# Patient Record
Sex: Female | Born: 1970 | Race: White | Hispanic: No | Marital: Married | Smoking: Never smoker
Health system: Southern US, Community
[De-identification: ages and names within clinical notes are randomized; demographics above are authoritative.]

## PROBLEM LIST (undated history)

## (undated) ENCOUNTER — Ambulatory Visit: Payer: 59 | Source: Home / Self Care

## (undated) DIAGNOSIS — J45909 Unspecified asthma, uncomplicated: Secondary | ICD-10-CM

## (undated) DIAGNOSIS — E119 Type 2 diabetes mellitus without complications: Secondary | ICD-10-CM

## (undated) DIAGNOSIS — I82409 Acute embolism and thrombosis of unspecified deep veins of unspecified lower extremity: Secondary | ICD-10-CM

## (undated) DIAGNOSIS — M0600 Rheumatoid arthritis without rheumatoid factor, unspecified site: Secondary | ICD-10-CM

## (undated) HISTORY — PX: BACK SURGERY: SHX140

## (undated) HISTORY — PX: CATARACT EXTRACTION: SUR2

## (undated) HISTORY — PX: EYE SURGERY: SHX253

## (undated) HISTORY — PX: OTHER SURGICAL HISTORY: SHX169

## (undated) HISTORY — PX: LAPAROSCOPIC GASTRIC SLEEVE RESECTION: SHX5895

---

## 2000-04-09 HISTORY — PX: CERVICAL CONE BIOPSY: SUR198

## 2006-03-09 HISTORY — PX: CORNEAL TRANSPLANT: SHX108

## 2008-10-30 DIAGNOSIS — L209 Atopic dermatitis, unspecified: Secondary | ICD-10-CM | POA: Insufficient documentation

## 2008-10-30 DIAGNOSIS — J4520 Mild intermittent asthma, uncomplicated: Secondary | ICD-10-CM | POA: Insufficient documentation

## 2008-10-30 DIAGNOSIS — J45909 Unspecified asthma, uncomplicated: Secondary | ICD-10-CM | POA: Insufficient documentation

## 2008-10-30 DIAGNOSIS — J4540 Moderate persistent asthma, uncomplicated: Secondary | ICD-10-CM | POA: Diagnosis present

## 2008-10-30 HISTORY — DX: Atopic dermatitis, unspecified: L20.9

## 2008-10-30 HISTORY — DX: Mild intermittent asthma, uncomplicated: J45.20

## 2012-08-31 DIAGNOSIS — H6993 Unspecified Eustachian tube disorder, bilateral: Secondary | ICD-10-CM | POA: Insufficient documentation

## 2012-08-31 HISTORY — DX: Unspecified eustachian tube disorder, bilateral: H69.93

## 2013-07-16 DIAGNOSIS — R350 Frequency of micturition: Secondary | ICD-10-CM

## 2013-07-16 HISTORY — DX: Frequency of micturition: R35.0

## 2013-12-12 DIAGNOSIS — G4733 Obstructive sleep apnea (adult) (pediatric): Secondary | ICD-10-CM

## 2013-12-12 HISTORY — DX: Obstructive sleep apnea (adult) (pediatric): G47.33

## 2014-08-28 DIAGNOSIS — M1380 Other specified arthritis, unspecified site: Secondary | ICD-10-CM

## 2014-08-28 DIAGNOSIS — H18609 Keratoconus, unspecified, unspecified eye: Secondary | ICD-10-CM | POA: Insufficient documentation

## 2014-08-28 DIAGNOSIS — M797 Fibromyalgia: Secondary | ICD-10-CM | POA: Insufficient documentation

## 2014-08-28 DIAGNOSIS — G43009 Migraine without aura, not intractable, without status migrainosus: Secondary | ICD-10-CM

## 2014-08-28 DIAGNOSIS — G43909 Migraine, unspecified, not intractable, without status migrainosus: Secondary | ICD-10-CM

## 2014-08-28 DIAGNOSIS — I10 Essential (primary) hypertension: Secondary | ICD-10-CM | POA: Insufficient documentation

## 2014-08-28 DIAGNOSIS — G43709 Chronic migraine without aura, not intractable, without status migrainosus: Secondary | ICD-10-CM | POA: Insufficient documentation

## 2014-08-28 HISTORY — DX: Essential (primary) hypertension: I10

## 2014-08-28 HISTORY — DX: Migraine, unspecified, not intractable, without status migrainosus: G43.909

## 2014-08-28 HISTORY — DX: Other specified arthritis, unspecified site: M13.80

## 2014-08-28 HISTORY — DX: Fibromyalgia: M79.7

## 2014-08-28 HISTORY — DX: Migraine without aura, not intractable, without status migrainosus: G43.009

## 2014-08-28 HISTORY — DX: Keratoconus, unspecified, unspecified eye: H18.609

## 2014-11-04 DIAGNOSIS — F419 Anxiety disorder, unspecified: Secondary | ICD-10-CM

## 2014-11-04 HISTORY — DX: Anxiety disorder, unspecified: F41.9

## 2014-11-29 DIAGNOSIS — N924 Excessive bleeding in the premenopausal period: Secondary | ICD-10-CM

## 2014-11-29 DIAGNOSIS — Z8742 Personal history of other diseases of the female genital tract: Secondary | ICD-10-CM

## 2014-11-29 HISTORY — DX: Personal history of other diseases of the female genital tract: Z87.42

## 2014-11-29 HISTORY — DX: Excessive bleeding in the premenopausal period: N92.4

## 2014-11-30 DIAGNOSIS — K639 Disease of intestine, unspecified: Secondary | ICD-10-CM

## 2014-11-30 HISTORY — DX: Enteropathic arthropathies, unspecified site: K63.9

## 2015-04-21 DIAGNOSIS — K5090 Crohn's disease, unspecified, without complications: Secondary | ICD-10-CM

## 2015-04-21 DIAGNOSIS — E119 Type 2 diabetes mellitus without complications: Secondary | ICD-10-CM

## 2015-04-21 DIAGNOSIS — M25561 Pain in right knee: Secondary | ICD-10-CM | POA: Insufficient documentation

## 2015-04-21 DIAGNOSIS — M170 Bilateral primary osteoarthritis of knee: Secondary | ICD-10-CM | POA: Insufficient documentation

## 2015-04-21 HISTORY — DX: Pain in right knee: M25.561

## 2015-04-21 HISTORY — DX: Crohn's disease, unspecified, without complications: K50.90

## 2015-04-21 HISTORY — DX: Type 2 diabetes mellitus without complications: E11.9

## 2015-04-21 HISTORY — DX: Bilateral primary osteoarthritis of knee: M17.0

## 2015-05-06 DIAGNOSIS — H04123 Dry eye syndrome of bilateral lacrimal glands: Secondary | ICD-10-CM

## 2015-05-06 DIAGNOSIS — H269 Unspecified cataract: Secondary | ICD-10-CM | POA: Insufficient documentation

## 2015-05-06 HISTORY — DX: Dry eye syndrome of bilateral lacrimal glands: H04.123

## 2015-05-06 HISTORY — DX: Unspecified cataract: H26.9

## 2015-06-18 DIAGNOSIS — R319 Hematuria, unspecified: Secondary | ICD-10-CM

## 2015-06-18 HISTORY — DX: Hematuria, unspecified: R31.9

## 2015-06-27 DIAGNOSIS — R3129 Other microscopic hematuria: Secondary | ICD-10-CM | POA: Insufficient documentation

## 2015-06-27 DIAGNOSIS — I10 Essential (primary) hypertension: Secondary | ICD-10-CM | POA: Insufficient documentation

## 2015-06-27 DIAGNOSIS — R809 Proteinuria, unspecified: Secondary | ICD-10-CM

## 2015-06-27 HISTORY — DX: Essential (primary) hypertension: I10

## 2015-06-27 HISTORY — DX: Proteinuria, unspecified: R80.9

## 2015-06-27 HISTORY — DX: Other microscopic hematuria: R31.29

## 2015-08-10 HISTORY — PX: GASTRIC BYPASS: SHX52

## 2015-10-07 DIAGNOSIS — R32 Unspecified urinary incontinence: Secondary | ICD-10-CM

## 2015-10-07 HISTORY — DX: Unspecified urinary incontinence: R32

## 2015-11-18 DIAGNOSIS — N3281 Overactive bladder: Secondary | ICD-10-CM | POA: Insufficient documentation

## 2015-11-18 HISTORY — DX: Overactive bladder: N32.81

## 2016-03-18 DIAGNOSIS — K921 Melena: Secondary | ICD-10-CM

## 2016-03-18 HISTORY — DX: Melena: K92.1

## 2016-05-07 DIAGNOSIS — H5210 Myopia, unspecified eye: Secondary | ICD-10-CM

## 2016-05-07 DIAGNOSIS — Z947 Corneal transplant status: Secondary | ICD-10-CM

## 2016-05-07 DIAGNOSIS — H40003 Preglaucoma, unspecified, bilateral: Secondary | ICD-10-CM | POA: Insufficient documentation

## 2016-05-07 HISTORY — DX: Myopia, unspecified eye: H52.10

## 2016-05-07 HISTORY — DX: Corneal transplant status: Z94.7

## 2016-05-07 HISTORY — DX: Preglaucoma, unspecified, bilateral: H40.003

## 2016-11-04 DIAGNOSIS — B070 Plantar wart: Secondary | ICD-10-CM | POA: Insufficient documentation

## 2016-11-04 HISTORY — DX: Plantar wart: B07.0

## 2016-11-07 HISTORY — PX: TOTAL KNEE ARTHROPLASTY: SHX125

## 2017-10-07 DIAGNOSIS — G4769 Other sleep related movement disorders: Secondary | ICD-10-CM

## 2017-10-07 HISTORY — DX: Other sleep related movement disorders: G47.69

## 2018-09-08 DIAGNOSIS — N200 Calculus of kidney: Secondary | ICD-10-CM | POA: Insufficient documentation

## 2018-09-08 HISTORY — DX: Calculus of kidney: N20.0

## 2018-10-11 DIAGNOSIS — H52213 Irregular astigmatism, bilateral: Secondary | ICD-10-CM | POA: Insufficient documentation

## 2018-10-11 HISTORY — DX: Irregular astigmatism, bilateral: H52.213

## 2019-06-19 DIAGNOSIS — Z860100 Personal history of colon polyps, unspecified: Secondary | ICD-10-CM

## 2019-06-19 HISTORY — DX: Personal history of colon polyps, unspecified: Z86.0100

## 2019-06-19 HISTORY — DX: Personal history of colonic polyps: Z86.010

## 2019-07-10 LAB — HM COLONOSCOPY

## 2019-08-01 DIAGNOSIS — Z9884 Bariatric surgery status: Secondary | ICD-10-CM | POA: Insufficient documentation

## 2019-08-01 DIAGNOSIS — K2100 Gastro-esophageal reflux disease with esophagitis, without bleeding: Secondary | ICD-10-CM

## 2019-08-01 DIAGNOSIS — K5901 Slow transit constipation: Secondary | ICD-10-CM

## 2019-08-01 HISTORY — DX: Slow transit constipation: K59.01

## 2019-08-01 HISTORY — DX: Gastro-esophageal reflux disease with esophagitis, without bleeding: K21.00

## 2019-08-01 HISTORY — DX: Bariatric surgery status: Z98.84

## 2020-05-12 DIAGNOSIS — M9905 Segmental and somatic dysfunction of pelvic region: Secondary | ICD-10-CM | POA: Diagnosis not present

## 2020-05-12 DIAGNOSIS — M9902 Segmental and somatic dysfunction of thoracic region: Secondary | ICD-10-CM | POA: Diagnosis not present

## 2020-05-12 DIAGNOSIS — M9904 Segmental and somatic dysfunction of sacral region: Secondary | ICD-10-CM | POA: Diagnosis not present

## 2020-05-12 DIAGNOSIS — M9903 Segmental and somatic dysfunction of lumbar region: Secondary | ICD-10-CM | POA: Diagnosis not present

## 2020-05-19 DIAGNOSIS — M9904 Segmental and somatic dysfunction of sacral region: Secondary | ICD-10-CM | POA: Diagnosis not present

## 2020-05-19 DIAGNOSIS — M9903 Segmental and somatic dysfunction of lumbar region: Secondary | ICD-10-CM | POA: Diagnosis not present

## 2020-05-19 DIAGNOSIS — M9905 Segmental and somatic dysfunction of pelvic region: Secondary | ICD-10-CM | POA: Diagnosis not present

## 2020-05-19 DIAGNOSIS — M9902 Segmental and somatic dysfunction of thoracic region: Secondary | ICD-10-CM | POA: Diagnosis not present

## 2020-05-26 DIAGNOSIS — M0609 Rheumatoid arthritis without rheumatoid factor, multiple sites: Secondary | ICD-10-CM | POA: Diagnosis not present

## 2020-05-26 DIAGNOSIS — M150 Primary generalized (osteo)arthritis: Secondary | ICD-10-CM | POA: Diagnosis not present

## 2020-05-26 DIAGNOSIS — M797 Fibromyalgia: Secondary | ICD-10-CM | POA: Diagnosis not present

## 2020-05-26 DIAGNOSIS — Z111 Encounter for screening for respiratory tuberculosis: Secondary | ICD-10-CM | POA: Diagnosis not present

## 2020-05-26 DIAGNOSIS — M2550 Pain in unspecified joint: Secondary | ICD-10-CM | POA: Diagnosis not present

## 2020-05-26 DIAGNOSIS — R5382 Chronic fatigue, unspecified: Secondary | ICD-10-CM | POA: Diagnosis not present

## 2020-05-27 DIAGNOSIS — J45909 Unspecified asthma, uncomplicated: Secondary | ICD-10-CM | POA: Diagnosis not present

## 2020-05-27 DIAGNOSIS — Z23 Encounter for immunization: Secondary | ICD-10-CM | POA: Diagnosis not present

## 2020-05-27 DIAGNOSIS — K219 Gastro-esophageal reflux disease without esophagitis: Secondary | ICD-10-CM | POA: Diagnosis not present

## 2020-05-27 DIAGNOSIS — N183 Chronic kidney disease, stage 3 unspecified: Secondary | ICD-10-CM | POA: Diagnosis not present

## 2020-05-27 DIAGNOSIS — M069 Rheumatoid arthritis, unspecified: Secondary | ICD-10-CM | POA: Diagnosis not present

## 2020-05-30 DIAGNOSIS — J45909 Unspecified asthma, uncomplicated: Secondary | ICD-10-CM | POA: Diagnosis not present

## 2020-05-30 DIAGNOSIS — T7840XA Allergy, unspecified, initial encounter: Secondary | ICD-10-CM | POA: Diagnosis not present

## 2020-06-03 DIAGNOSIS — M0609 Rheumatoid arthritis without rheumatoid factor, multiple sites: Secondary | ICD-10-CM | POA: Diagnosis not present

## 2020-06-16 DIAGNOSIS — M5136 Other intervertebral disc degeneration, lumbar region: Secondary | ICD-10-CM | POA: Diagnosis not present

## 2020-06-16 DIAGNOSIS — M9903 Segmental and somatic dysfunction of lumbar region: Secondary | ICD-10-CM | POA: Diagnosis not present

## 2020-06-16 DIAGNOSIS — M545 Low back pain, unspecified: Secondary | ICD-10-CM | POA: Diagnosis not present

## 2020-06-16 DIAGNOSIS — M9901 Segmental and somatic dysfunction of cervical region: Secondary | ICD-10-CM | POA: Diagnosis not present

## 2020-06-24 DIAGNOSIS — M797 Fibromyalgia: Secondary | ICD-10-CM | POA: Diagnosis not present

## 2020-06-24 DIAGNOSIS — M5451 Vertebrogenic low back pain: Secondary | ICD-10-CM | POA: Diagnosis not present

## 2020-06-24 DIAGNOSIS — M5386 Other specified dorsopathies, lumbar region: Secondary | ICD-10-CM | POA: Diagnosis not present

## 2020-06-24 DIAGNOSIS — M9903 Segmental and somatic dysfunction of lumbar region: Secondary | ICD-10-CM | POA: Diagnosis not present

## 2020-06-30 DIAGNOSIS — M9903 Segmental and somatic dysfunction of lumbar region: Secondary | ICD-10-CM | POA: Diagnosis not present

## 2020-06-30 DIAGNOSIS — M5451 Vertebrogenic low back pain: Secondary | ICD-10-CM | POA: Diagnosis not present

## 2020-06-30 DIAGNOSIS — M5386 Other specified dorsopathies, lumbar region: Secondary | ICD-10-CM | POA: Diagnosis not present

## 2020-06-30 DIAGNOSIS — M797 Fibromyalgia: Secondary | ICD-10-CM | POA: Diagnosis not present

## 2020-07-01 DIAGNOSIS — M0609 Rheumatoid arthritis without rheumatoid factor, multiple sites: Secondary | ICD-10-CM | POA: Diagnosis not present

## 2020-07-07 DIAGNOSIS — M0609 Rheumatoid arthritis without rheumatoid factor, multiple sites: Secondary | ICD-10-CM | POA: Diagnosis not present

## 2020-07-08 DIAGNOSIS — M797 Fibromyalgia: Secondary | ICD-10-CM | POA: Diagnosis not present

## 2020-07-08 DIAGNOSIS — M5386 Other specified dorsopathies, lumbar region: Secondary | ICD-10-CM | POA: Diagnosis not present

## 2020-07-08 DIAGNOSIS — M9903 Segmental and somatic dysfunction of lumbar region: Secondary | ICD-10-CM | POA: Diagnosis not present

## 2020-07-08 DIAGNOSIS — M5451 Vertebrogenic low back pain: Secondary | ICD-10-CM | POA: Diagnosis not present

## 2020-07-14 DIAGNOSIS — M797 Fibromyalgia: Secondary | ICD-10-CM | POA: Diagnosis not present

## 2020-07-14 DIAGNOSIS — M5451 Vertebrogenic low back pain: Secondary | ICD-10-CM | POA: Diagnosis not present

## 2020-07-14 DIAGNOSIS — M9903 Segmental and somatic dysfunction of lumbar region: Secondary | ICD-10-CM | POA: Diagnosis not present

## 2020-07-14 DIAGNOSIS — M5386 Other specified dorsopathies, lumbar region: Secondary | ICD-10-CM | POA: Diagnosis not present

## 2020-07-28 DIAGNOSIS — Z3046 Encounter for surveillance of implantable subdermal contraceptive: Secondary | ICD-10-CM | POA: Diagnosis not present

## 2020-08-04 DIAGNOSIS — M0609 Rheumatoid arthritis without rheumatoid factor, multiple sites: Secondary | ICD-10-CM | POA: Diagnosis not present

## 2020-08-12 DIAGNOSIS — Z87442 Personal history of urinary calculi: Secondary | ICD-10-CM

## 2020-08-12 DIAGNOSIS — G43009 Migraine without aura, not intractable, without status migrainosus: Secondary | ICD-10-CM | POA: Diagnosis not present

## 2020-08-12 DIAGNOSIS — R809 Proteinuria, unspecified: Secondary | ICD-10-CM

## 2020-08-12 DIAGNOSIS — Z9109 Other allergy status, other than to drugs and biological substances: Secondary | ICD-10-CM | POA: Insufficient documentation

## 2020-08-12 DIAGNOSIS — K047 Periapical abscess without sinus: Secondary | ICD-10-CM | POA: Diagnosis not present

## 2020-08-12 DIAGNOSIS — K219 Gastro-esophageal reflux disease without esophagitis: Secondary | ICD-10-CM | POA: Diagnosis not present

## 2020-08-12 DIAGNOSIS — J4520 Mild intermittent asthma, uncomplicated: Secondary | ICD-10-CM | POA: Diagnosis not present

## 2020-08-12 HISTORY — DX: Periapical abscess without sinus: K04.7

## 2020-08-12 HISTORY — DX: Personal history of urinary calculi: Z87.442

## 2020-08-12 HISTORY — DX: Proteinuria, unspecified: R80.9

## 2020-08-12 HISTORY — DX: Other allergy status, other than to drugs and biological substances: Z91.09

## 2020-08-14 DIAGNOSIS — Z1152 Encounter for screening for COVID-19: Secondary | ICD-10-CM | POA: Diagnosis not present

## 2020-08-14 DIAGNOSIS — K0889 Other specified disorders of teeth and supporting structures: Secondary | ICD-10-CM | POA: Diagnosis not present

## 2020-08-18 DIAGNOSIS — M9903 Segmental and somatic dysfunction of lumbar region: Secondary | ICD-10-CM | POA: Diagnosis not present

## 2020-08-18 DIAGNOSIS — M5451 Vertebrogenic low back pain: Secondary | ICD-10-CM | POA: Diagnosis not present

## 2020-08-18 DIAGNOSIS — M5386 Other specified dorsopathies, lumbar region: Secondary | ICD-10-CM | POA: Diagnosis not present

## 2020-08-18 DIAGNOSIS — M797 Fibromyalgia: Secondary | ICD-10-CM | POA: Diagnosis not present

## 2020-09-01 DIAGNOSIS — M0609 Rheumatoid arthritis without rheumatoid factor, multiple sites: Secondary | ICD-10-CM | POA: Diagnosis not present

## 2020-09-01 DIAGNOSIS — M5413 Radiculopathy, cervicothoracic region: Secondary | ICD-10-CM | POA: Diagnosis not present

## 2020-09-01 DIAGNOSIS — M5415 Radiculopathy, thoracolumbar region: Secondary | ICD-10-CM | POA: Diagnosis not present

## 2020-09-01 DIAGNOSIS — M9901 Segmental and somatic dysfunction of cervical region: Secondary | ICD-10-CM | POA: Diagnosis not present

## 2020-09-01 DIAGNOSIS — M9902 Segmental and somatic dysfunction of thoracic region: Secondary | ICD-10-CM | POA: Diagnosis not present

## 2020-09-09 DIAGNOSIS — M2550 Pain in unspecified joint: Secondary | ICD-10-CM | POA: Diagnosis not present

## 2020-09-09 DIAGNOSIS — M0609 Rheumatoid arthritis without rheumatoid factor, multiple sites: Secondary | ICD-10-CM | POA: Diagnosis not present

## 2020-09-09 DIAGNOSIS — M150 Primary generalized (osteo)arthritis: Secondary | ICD-10-CM | POA: Diagnosis not present

## 2020-09-09 DIAGNOSIS — M797 Fibromyalgia: Secondary | ICD-10-CM | POA: Diagnosis not present

## 2020-09-10 DIAGNOSIS — M9902 Segmental and somatic dysfunction of thoracic region: Secondary | ICD-10-CM | POA: Diagnosis not present

## 2020-09-10 DIAGNOSIS — M5415 Radiculopathy, thoracolumbar region: Secondary | ICD-10-CM | POA: Diagnosis not present

## 2020-09-10 DIAGNOSIS — M9901 Segmental and somatic dysfunction of cervical region: Secondary | ICD-10-CM | POA: Diagnosis not present

## 2020-09-10 DIAGNOSIS — M5413 Radiculopathy, cervicothoracic region: Secondary | ICD-10-CM | POA: Diagnosis not present

## 2020-09-11 DIAGNOSIS — K920 Hematemesis: Secondary | ICD-10-CM | POA: Diagnosis not present

## 2020-09-11 DIAGNOSIS — R3129 Other microscopic hematuria: Secondary | ICD-10-CM | POA: Diagnosis not present

## 2020-09-11 DIAGNOSIS — R110 Nausea: Secondary | ICD-10-CM | POA: Diagnosis not present

## 2020-09-11 DIAGNOSIS — R195 Other fecal abnormalities: Secondary | ICD-10-CM | POA: Diagnosis not present

## 2020-09-15 DIAGNOSIS — K219 Gastro-esophageal reflux disease without esophagitis: Secondary | ICD-10-CM | POA: Diagnosis not present

## 2020-09-15 DIAGNOSIS — K224 Dyskinesia of esophagus: Secondary | ICD-10-CM | POA: Diagnosis not present

## 2020-09-15 DIAGNOSIS — R112 Nausea with vomiting, unspecified: Secondary | ICD-10-CM | POA: Diagnosis not present

## 2020-09-15 DIAGNOSIS — Z86010 Personal history of colonic polyps: Secondary | ICD-10-CM | POA: Diagnosis not present

## 2020-09-15 DIAGNOSIS — K920 Hematemesis: Secondary | ICD-10-CM | POA: Diagnosis not present

## 2020-09-22 DIAGNOSIS — Z01419 Encounter for gynecological examination (general) (routine) without abnormal findings: Secondary | ICD-10-CM | POA: Diagnosis not present

## 2020-09-22 DIAGNOSIS — Z1231 Encounter for screening mammogram for malignant neoplasm of breast: Secondary | ICD-10-CM | POA: Diagnosis not present

## 2020-09-30 DIAGNOSIS — G8929 Other chronic pain: Secondary | ICD-10-CM | POA: Diagnosis not present

## 2020-09-30 DIAGNOSIS — M1712 Unilateral primary osteoarthritis, left knee: Secondary | ICD-10-CM | POA: Diagnosis not present

## 2020-09-30 DIAGNOSIS — M25561 Pain in right knee: Secondary | ICD-10-CM | POA: Diagnosis not present

## 2020-09-30 DIAGNOSIS — M1711 Unilateral primary osteoarthritis, right knee: Secondary | ICD-10-CM | POA: Diagnosis not present

## 2020-09-30 DIAGNOSIS — M25562 Pain in left knee: Secondary | ICD-10-CM | POA: Diagnosis not present

## 2020-09-30 DIAGNOSIS — Z96651 Presence of right artificial knee joint: Secondary | ICD-10-CM | POA: Diagnosis not present

## 2020-10-06 DIAGNOSIS — M0609 Rheumatoid arthritis without rheumatoid factor, multiple sites: Secondary | ICD-10-CM | POA: Diagnosis not present

## 2020-10-12 DIAGNOSIS — M069 Rheumatoid arthritis, unspecified: Secondary | ICD-10-CM | POA: Diagnosis not present

## 2020-10-12 DIAGNOSIS — R112 Nausea with vomiting, unspecified: Secondary | ICD-10-CM | POA: Diagnosis not present

## 2020-10-12 DIAGNOSIS — Z79899 Other long term (current) drug therapy: Secondary | ICD-10-CM | POA: Diagnosis not present

## 2020-10-12 DIAGNOSIS — J449 Chronic obstructive pulmonary disease, unspecified: Secondary | ICD-10-CM | POA: Diagnosis not present

## 2020-10-16 DIAGNOSIS — M9901 Segmental and somatic dysfunction of cervical region: Secondary | ICD-10-CM | POA: Diagnosis not present

## 2020-10-16 DIAGNOSIS — M5136 Other intervertebral disc degeneration, lumbar region: Secondary | ICD-10-CM | POA: Diagnosis not present

## 2020-10-16 DIAGNOSIS — M9903 Segmental and somatic dysfunction of lumbar region: Secondary | ICD-10-CM | POA: Diagnosis not present

## 2020-10-16 DIAGNOSIS — M5451 Vertebrogenic low back pain: Secondary | ICD-10-CM | POA: Diagnosis not present

## 2020-10-20 DIAGNOSIS — K219 Gastro-esophageal reflux disease without esophagitis: Secondary | ICD-10-CM | POA: Diagnosis not present

## 2020-10-20 DIAGNOSIS — K2100 Gastro-esophageal reflux disease with esophagitis, without bleeding: Secondary | ICD-10-CM | POA: Diagnosis not present

## 2020-10-20 DIAGNOSIS — K920 Hematemesis: Secondary | ICD-10-CM | POA: Diagnosis not present

## 2020-10-20 DIAGNOSIS — B3781 Candidal esophagitis: Secondary | ICD-10-CM | POA: Diagnosis not present

## 2020-10-20 DIAGNOSIS — Z9884 Bariatric surgery status: Secondary | ICD-10-CM | POA: Diagnosis not present

## 2020-10-20 DIAGNOSIS — Z903 Acquired absence of stomach [part of]: Secondary | ICD-10-CM | POA: Diagnosis not present

## 2020-10-20 DIAGNOSIS — K2210 Ulcer of esophagus without bleeding: Secondary | ICD-10-CM | POA: Diagnosis not present

## 2020-10-20 DIAGNOSIS — K208 Other esophagitis without bleeding: Secondary | ICD-10-CM | POA: Diagnosis not present

## 2020-10-20 DIAGNOSIS — K319 Disease of stomach and duodenum, unspecified: Secondary | ICD-10-CM | POA: Diagnosis not present

## 2020-10-21 DIAGNOSIS — M5451 Vertebrogenic low back pain: Secondary | ICD-10-CM | POA: Diagnosis not present

## 2020-10-21 DIAGNOSIS — M9903 Segmental and somatic dysfunction of lumbar region: Secondary | ICD-10-CM | POA: Diagnosis not present

## 2020-10-21 DIAGNOSIS — Z1231 Encounter for screening mammogram for malignant neoplasm of breast: Secondary | ICD-10-CM | POA: Diagnosis not present

## 2020-10-21 DIAGNOSIS — M5136 Other intervertebral disc degeneration, lumbar region: Secondary | ICD-10-CM | POA: Diagnosis not present

## 2020-10-21 DIAGNOSIS — M9901 Segmental and somatic dysfunction of cervical region: Secondary | ICD-10-CM | POA: Diagnosis not present

## 2020-10-26 DIAGNOSIS — B370 Candidal stomatitis: Secondary | ICD-10-CM | POA: Diagnosis not present

## 2020-10-27 DIAGNOSIS — M5136 Other intervertebral disc degeneration, lumbar region: Secondary | ICD-10-CM | POA: Diagnosis not present

## 2020-10-27 DIAGNOSIS — M9901 Segmental and somatic dysfunction of cervical region: Secondary | ICD-10-CM | POA: Diagnosis not present

## 2020-10-27 DIAGNOSIS — M9903 Segmental and somatic dysfunction of lumbar region: Secondary | ICD-10-CM | POA: Diagnosis not present

## 2020-10-27 DIAGNOSIS — M5451 Vertebrogenic low back pain: Secondary | ICD-10-CM | POA: Diagnosis not present

## 2020-11-04 DIAGNOSIS — R801 Persistent proteinuria, unspecified: Secondary | ICD-10-CM | POA: Diagnosis not present

## 2020-11-04 DIAGNOSIS — N3946 Mixed incontinence: Secondary | ICD-10-CM | POA: Diagnosis not present

## 2020-11-04 DIAGNOSIS — N200 Calculus of kidney: Secondary | ICD-10-CM | POA: Diagnosis not present

## 2020-11-04 DIAGNOSIS — R3129 Other microscopic hematuria: Secondary | ICD-10-CM | POA: Diagnosis not present

## 2020-11-11 DIAGNOSIS — M170 Bilateral primary osteoarthritis of knee: Secondary | ICD-10-CM | POA: Diagnosis not present

## 2020-11-17 DIAGNOSIS — M0609 Rheumatoid arthritis without rheumatoid factor, multiple sites: Secondary | ICD-10-CM | POA: Diagnosis not present

## 2020-11-18 DIAGNOSIS — N200 Calculus of kidney: Secondary | ICD-10-CM | POA: Diagnosis not present

## 2020-11-18 DIAGNOSIS — N132 Hydronephrosis with renal and ureteral calculous obstruction: Secondary | ICD-10-CM | POA: Diagnosis not present

## 2020-11-18 DIAGNOSIS — K219 Gastro-esophageal reflux disease without esophagitis: Secondary | ICD-10-CM | POA: Diagnosis not present

## 2020-11-18 DIAGNOSIS — R1319 Other dysphagia: Secondary | ICD-10-CM | POA: Diagnosis not present

## 2020-11-19 DIAGNOSIS — R109 Unspecified abdominal pain: Secondary | ICD-10-CM | POA: Diagnosis not present

## 2020-11-19 DIAGNOSIS — R319 Hematuria, unspecified: Secondary | ICD-10-CM | POA: Diagnosis not present

## 2020-11-19 DIAGNOSIS — N201 Calculus of ureter: Secondary | ICD-10-CM | POA: Diagnosis not present

## 2020-11-19 DIAGNOSIS — N132 Hydronephrosis with renal and ureteral calculous obstruction: Secondary | ICD-10-CM | POA: Diagnosis not present

## 2020-11-21 DIAGNOSIS — Z20822 Contact with and (suspected) exposure to covid-19: Secondary | ICD-10-CM | POA: Diagnosis not present

## 2020-11-21 DIAGNOSIS — M47816 Spondylosis without myelopathy or radiculopathy, lumbar region: Secondary | ICD-10-CM | POA: Diagnosis not present

## 2020-11-21 DIAGNOSIS — R1031 Right lower quadrant pain: Secondary | ICD-10-CM | POA: Diagnosis not present

## 2020-11-21 DIAGNOSIS — R279 Unspecified lack of coordination: Secondary | ICD-10-CM | POA: Diagnosis not present

## 2020-11-21 DIAGNOSIS — N200 Calculus of kidney: Secondary | ICD-10-CM | POA: Diagnosis not present

## 2020-11-21 DIAGNOSIS — K449 Diaphragmatic hernia without obstruction or gangrene: Secondary | ICD-10-CM | POA: Diagnosis not present

## 2020-11-21 DIAGNOSIS — J449 Chronic obstructive pulmonary disease, unspecified: Secondary | ICD-10-CM | POA: Diagnosis not present

## 2020-11-21 DIAGNOSIS — K429 Umbilical hernia without obstruction or gangrene: Secondary | ICD-10-CM | POA: Diagnosis not present

## 2020-11-21 DIAGNOSIS — N132 Hydronephrosis with renal and ureteral calculous obstruction: Secondary | ICD-10-CM | POA: Diagnosis not present

## 2020-11-21 DIAGNOSIS — Z743 Need for continuous supervision: Secondary | ICD-10-CM | POA: Diagnosis not present

## 2020-11-21 DIAGNOSIS — M069 Rheumatoid arthritis, unspecified: Secondary | ICD-10-CM | POA: Diagnosis not present

## 2020-11-21 DIAGNOSIS — N1330 Unspecified hydronephrosis: Secondary | ICD-10-CM | POA: Diagnosis not present

## 2020-11-21 DIAGNOSIS — Z79899 Other long term (current) drug therapy: Secondary | ICD-10-CM | POA: Diagnosis not present

## 2020-11-22 DIAGNOSIS — N132 Hydronephrosis with renal and ureteral calculous obstruction: Secondary | ICD-10-CM | POA: Diagnosis not present

## 2020-11-24 DIAGNOSIS — I2510 Atherosclerotic heart disease of native coronary artery without angina pectoris: Secondary | ICD-10-CM | POA: Diagnosis not present

## 2020-11-24 DIAGNOSIS — R590 Localized enlarged lymph nodes: Secondary | ICD-10-CM | POA: Diagnosis not present

## 2020-11-24 DIAGNOSIS — R933 Abnormal findings on diagnostic imaging of other parts of digestive tract: Secondary | ICD-10-CM | POA: Diagnosis not present

## 2020-11-24 DIAGNOSIS — R1310 Dysphagia, unspecified: Secondary | ICD-10-CM | POA: Diagnosis not present

## 2020-11-24 DIAGNOSIS — R109 Unspecified abdominal pain: Secondary | ICD-10-CM | POA: Insufficient documentation

## 2020-11-24 DIAGNOSIS — K449 Diaphragmatic hernia without obstruction or gangrene: Secondary | ICD-10-CM | POA: Diagnosis not present

## 2020-11-24 HISTORY — DX: Unspecified abdominal pain: R10.9

## 2020-11-28 DIAGNOSIS — K224 Dyskinesia of esophagus: Secondary | ICD-10-CM | POA: Diagnosis not present

## 2020-12-01 DIAGNOSIS — M25562 Pain in left knee: Secondary | ICD-10-CM | POA: Diagnosis not present

## 2020-12-01 DIAGNOSIS — G8929 Other chronic pain: Secondary | ICD-10-CM | POA: Diagnosis not present

## 2020-12-01 DIAGNOSIS — Z947 Corneal transplant status: Secondary | ICD-10-CM | POA: Diagnosis not present

## 2020-12-01 DIAGNOSIS — H25813 Combined forms of age-related cataract, bilateral: Secondary | ICD-10-CM | POA: Diagnosis not present

## 2020-12-01 DIAGNOSIS — M170 Bilateral primary osteoarthritis of knee: Secondary | ICD-10-CM | POA: Diagnosis not present

## 2020-12-01 DIAGNOSIS — H04123 Dry eye syndrome of bilateral lacrimal glands: Secondary | ICD-10-CM | POA: Diagnosis not present

## 2020-12-05 DIAGNOSIS — N201 Calculus of ureter: Secondary | ICD-10-CM | POA: Diagnosis not present

## 2020-12-09 DIAGNOSIS — R112 Nausea with vomiting, unspecified: Secondary | ICD-10-CM | POA: Diagnosis not present

## 2020-12-09 DIAGNOSIS — K224 Dyskinesia of esophagus: Secondary | ICD-10-CM | POA: Diagnosis not present

## 2020-12-09 DIAGNOSIS — Z903 Acquired absence of stomach [part of]: Secondary | ICD-10-CM | POA: Diagnosis not present

## 2020-12-09 DIAGNOSIS — K219 Gastro-esophageal reflux disease without esophagitis: Secondary | ICD-10-CM | POA: Diagnosis not present

## 2020-12-15 DIAGNOSIS — M25562 Pain in left knee: Secondary | ICD-10-CM | POA: Diagnosis not present

## 2020-12-15 DIAGNOSIS — M1712 Unilateral primary osteoarthritis, left knee: Secondary | ICD-10-CM | POA: Diagnosis not present

## 2020-12-15 DIAGNOSIS — M25561 Pain in right knee: Secondary | ICD-10-CM | POA: Diagnosis not present

## 2020-12-19 DIAGNOSIS — R109 Unspecified abdominal pain: Secondary | ICD-10-CM | POA: Diagnosis not present

## 2020-12-19 DIAGNOSIS — M0609 Rheumatoid arthritis without rheumatoid factor, multiple sites: Secondary | ICD-10-CM | POA: Diagnosis not present

## 2020-12-19 DIAGNOSIS — N3946 Mixed incontinence: Secondary | ICD-10-CM | POA: Diagnosis not present

## 2020-12-19 DIAGNOSIS — Z960 Presence of urogenital implants: Secondary | ICD-10-CM | POA: Diagnosis not present

## 2020-12-19 DIAGNOSIS — R319 Hematuria, unspecified: Secondary | ICD-10-CM | POA: Diagnosis not present

## 2020-12-19 DIAGNOSIS — Z87442 Personal history of urinary calculi: Secondary | ICD-10-CM | POA: Diagnosis not present

## 2020-12-26 DIAGNOSIS — M9901 Segmental and somatic dysfunction of cervical region: Secondary | ICD-10-CM | POA: Diagnosis not present

## 2020-12-26 DIAGNOSIS — R112 Nausea with vomiting, unspecified: Secondary | ICD-10-CM | POA: Diagnosis not present

## 2020-12-26 DIAGNOSIS — M5136 Other intervertebral disc degeneration, lumbar region: Secondary | ICD-10-CM | POA: Diagnosis not present

## 2020-12-26 DIAGNOSIS — M9903 Segmental and somatic dysfunction of lumbar region: Secondary | ICD-10-CM | POA: Diagnosis not present

## 2020-12-26 DIAGNOSIS — Z87442 Personal history of urinary calculi: Secondary | ICD-10-CM | POA: Diagnosis not present

## 2020-12-26 DIAGNOSIS — R1110 Vomiting, unspecified: Secondary | ICD-10-CM | POA: Diagnosis not present

## 2020-12-26 DIAGNOSIS — Z960 Presence of urogenital implants: Secondary | ICD-10-CM | POA: Diagnosis not present

## 2020-12-26 DIAGNOSIS — M5451 Vertebrogenic low back pain: Secondary | ICD-10-CM | POA: Diagnosis not present

## 2021-01-02 DIAGNOSIS — M5451 Vertebrogenic low back pain: Secondary | ICD-10-CM | POA: Diagnosis not present

## 2021-01-02 DIAGNOSIS — M9903 Segmental and somatic dysfunction of lumbar region: Secondary | ICD-10-CM | POA: Diagnosis not present

## 2021-01-02 DIAGNOSIS — M5136 Other intervertebral disc degeneration, lumbar region: Secondary | ICD-10-CM | POA: Diagnosis not present

## 2021-01-02 DIAGNOSIS — M9901 Segmental and somatic dysfunction of cervical region: Secondary | ICD-10-CM | POA: Diagnosis not present

## 2021-01-09 DIAGNOSIS — R1110 Vomiting, unspecified: Secondary | ICD-10-CM | POA: Diagnosis not present

## 2021-01-09 DIAGNOSIS — R1310 Dysphagia, unspecified: Secondary | ICD-10-CM | POA: Diagnosis not present

## 2021-01-09 DIAGNOSIS — M5451 Vertebrogenic low back pain: Secondary | ICD-10-CM | POA: Diagnosis not present

## 2021-01-09 DIAGNOSIS — M5136 Other intervertebral disc degeneration, lumbar region: Secondary | ICD-10-CM | POA: Diagnosis not present

## 2021-01-09 DIAGNOSIS — R079 Chest pain, unspecified: Secondary | ICD-10-CM | POA: Diagnosis not present

## 2021-01-09 DIAGNOSIS — M9903 Segmental and somatic dysfunction of lumbar region: Secondary | ICD-10-CM | POA: Diagnosis not present

## 2021-01-09 DIAGNOSIS — M9901 Segmental and somatic dysfunction of cervical region: Secondary | ICD-10-CM | POA: Diagnosis not present

## 2021-01-13 DIAGNOSIS — Z947 Corneal transplant status: Secondary | ICD-10-CM | POA: Diagnosis not present

## 2021-01-13 DIAGNOSIS — N3281 Overactive bladder: Secondary | ICD-10-CM | POA: Diagnosis not present

## 2021-01-13 DIAGNOSIS — N201 Calculus of ureter: Secondary | ICD-10-CM | POA: Diagnosis not present

## 2021-01-13 DIAGNOSIS — J4520 Mild intermittent asthma, uncomplicated: Secondary | ICD-10-CM | POA: Diagnosis not present

## 2021-01-13 DIAGNOSIS — H18609 Keratoconus, unspecified, unspecified eye: Secondary | ICD-10-CM | POA: Diagnosis not present

## 2021-01-13 DIAGNOSIS — Z87442 Personal history of urinary calculi: Secondary | ICD-10-CM | POA: Diagnosis not present

## 2021-01-13 DIAGNOSIS — N132 Hydronephrosis with renal and ureteral calculous obstruction: Secondary | ICD-10-CM | POA: Diagnosis not present

## 2021-01-13 DIAGNOSIS — G43009 Migraine without aura, not intractable, without status migrainosus: Secondary | ICD-10-CM | POA: Diagnosis not present

## 2021-01-13 DIAGNOSIS — Z9884 Bariatric surgery status: Secondary | ICD-10-CM | POA: Diagnosis not present

## 2021-01-13 DIAGNOSIS — K5090 Crohn's disease, unspecified, without complications: Secondary | ICD-10-CM | POA: Diagnosis not present

## 2021-01-13 DIAGNOSIS — K2100 Gastro-esophageal reflux disease with esophagitis, without bleeding: Secondary | ICD-10-CM | POA: Diagnosis not present

## 2021-01-13 DIAGNOSIS — N1330 Unspecified hydronephrosis: Secondary | ICD-10-CM | POA: Diagnosis not present

## 2021-01-13 DIAGNOSIS — N200 Calculus of kidney: Secondary | ICD-10-CM | POA: Diagnosis not present

## 2021-01-13 DIAGNOSIS — M170 Bilateral primary osteoarthritis of knee: Secondary | ICD-10-CM | POA: Diagnosis not present

## 2021-01-13 DIAGNOSIS — R1031 Right lower quadrant pain: Secondary | ICD-10-CM | POA: Diagnosis not present

## 2021-01-13 DIAGNOSIS — G8929 Other chronic pain: Secondary | ICD-10-CM | POA: Diagnosis not present

## 2021-01-13 DIAGNOSIS — M25562 Pain in left knee: Secondary | ICD-10-CM | POA: Diagnosis not present

## 2021-01-13 DIAGNOSIS — M069 Rheumatoid arthritis, unspecified: Secondary | ICD-10-CM | POA: Diagnosis not present

## 2021-01-13 DIAGNOSIS — N3946 Mixed incontinence: Secondary | ICD-10-CM | POA: Diagnosis not present

## 2021-01-14 DIAGNOSIS — Z947 Corneal transplant status: Secondary | ICD-10-CM | POA: Diagnosis not present

## 2021-01-14 DIAGNOSIS — M170 Bilateral primary osteoarthritis of knee: Secondary | ICD-10-CM | POA: Diagnosis not present

## 2021-01-14 DIAGNOSIS — N1339 Other hydronephrosis: Secondary | ICD-10-CM | POA: Diagnosis not present

## 2021-01-14 DIAGNOSIS — N3281 Overactive bladder: Secondary | ICD-10-CM | POA: Diagnosis not present

## 2021-01-14 DIAGNOSIS — N132 Hydronephrosis with renal and ureteral calculous obstruction: Secondary | ICD-10-CM | POA: Diagnosis not present

## 2021-01-14 DIAGNOSIS — H18609 Keratoconus, unspecified, unspecified eye: Secondary | ICD-10-CM | POA: Diagnosis not present

## 2021-01-14 DIAGNOSIS — K2100 Gastro-esophageal reflux disease with esophagitis, without bleeding: Secondary | ICD-10-CM | POA: Diagnosis not present

## 2021-01-14 DIAGNOSIS — M25562 Pain in left knee: Secondary | ICD-10-CM | POA: Diagnosis not present

## 2021-01-14 DIAGNOSIS — G43009 Migraine without aura, not intractable, without status migrainosus: Secondary | ICD-10-CM | POA: Diagnosis not present

## 2021-01-14 DIAGNOSIS — Z9884 Bariatric surgery status: Secondary | ICD-10-CM | POA: Diagnosis not present

## 2021-01-14 DIAGNOSIS — K5090 Crohn's disease, unspecified, without complications: Secondary | ICD-10-CM | POA: Diagnosis not present

## 2021-01-14 DIAGNOSIS — J4520 Mild intermittent asthma, uncomplicated: Secondary | ICD-10-CM | POA: Diagnosis not present

## 2021-01-14 DIAGNOSIS — N3946 Mixed incontinence: Secondary | ICD-10-CM | POA: Diagnosis not present

## 2021-01-14 DIAGNOSIS — M069 Rheumatoid arthritis, unspecified: Secondary | ICD-10-CM | POA: Diagnosis not present

## 2021-01-14 DIAGNOSIS — Z87442 Personal history of urinary calculi: Secondary | ICD-10-CM | POA: Diagnosis not present

## 2021-01-14 DIAGNOSIS — G8929 Other chronic pain: Secondary | ICD-10-CM | POA: Diagnosis not present

## 2021-01-23 DIAGNOSIS — M0609 Rheumatoid arthritis without rheumatoid factor, multiple sites: Secondary | ICD-10-CM | POA: Diagnosis not present

## 2021-01-24 DIAGNOSIS — Z9689 Presence of other specified functional implants: Secondary | ICD-10-CM | POA: Diagnosis not present

## 2021-01-24 DIAGNOSIS — K219 Gastro-esophageal reflux disease without esophagitis: Secondary | ICD-10-CM | POA: Diagnosis not present

## 2021-01-24 DIAGNOSIS — Z79899 Other long term (current) drug therapy: Secondary | ICD-10-CM | POA: Diagnosis not present

## 2021-01-24 DIAGNOSIS — R109 Unspecified abdominal pain: Secondary | ICD-10-CM | POA: Diagnosis not present

## 2021-01-24 DIAGNOSIS — Z9884 Bariatric surgery status: Secondary | ICD-10-CM | POA: Diagnosis not present

## 2021-01-24 DIAGNOSIS — E669 Obesity, unspecified: Secondary | ICD-10-CM | POA: Diagnosis not present

## 2021-01-24 DIAGNOSIS — N200 Calculus of kidney: Secondary | ICD-10-CM | POA: Diagnosis not present

## 2021-01-24 DIAGNOSIS — M069 Rheumatoid arthritis, unspecified: Secondary | ICD-10-CM | POA: Diagnosis not present

## 2021-01-24 DIAGNOSIS — D72829 Elevated white blood cell count, unspecified: Secondary | ICD-10-CM | POA: Diagnosis not present

## 2021-01-30 DIAGNOSIS — N200 Calculus of kidney: Secondary | ICD-10-CM | POA: Diagnosis not present

## 2021-02-02 DIAGNOSIS — N23 Unspecified renal colic: Secondary | ICD-10-CM | POA: Diagnosis not present

## 2021-02-02 DIAGNOSIS — K429 Umbilical hernia without obstruction or gangrene: Secondary | ICD-10-CM | POA: Diagnosis not present

## 2021-02-02 DIAGNOSIS — N1330 Unspecified hydronephrosis: Secondary | ICD-10-CM | POA: Diagnosis not present

## 2021-02-02 DIAGNOSIS — N200 Calculus of kidney: Secondary | ICD-10-CM | POA: Diagnosis not present

## 2021-02-02 DIAGNOSIS — K3189 Other diseases of stomach and duodenum: Secondary | ICD-10-CM | POA: Diagnosis not present

## 2021-02-02 DIAGNOSIS — R310 Gross hematuria: Secondary | ICD-10-CM | POA: Diagnosis not present

## 2021-02-02 DIAGNOSIS — R109 Unspecified abdominal pain: Secondary | ICD-10-CM | POA: Diagnosis not present

## 2021-02-06 DIAGNOSIS — Z960 Presence of urogenital implants: Secondary | ICD-10-CM | POA: Diagnosis not present

## 2021-02-06 DIAGNOSIS — N23 Unspecified renal colic: Secondary | ICD-10-CM | POA: Diagnosis not present

## 2021-02-06 DIAGNOSIS — R310 Gross hematuria: Secondary | ICD-10-CM | POA: Diagnosis not present

## 2021-02-06 DIAGNOSIS — N200 Calculus of kidney: Secondary | ICD-10-CM | POA: Diagnosis not present

## 2021-02-06 HISTORY — DX: Presence of urogenital implants: Z96.0

## 2021-02-08 ENCOUNTER — Emergency Department (HOSPITAL_COMMUNITY): Payer: BC Managed Care – PPO

## 2021-02-08 ENCOUNTER — Emergency Department (HOSPITAL_COMMUNITY)
Admission: EM | Admit: 2021-02-08 | Discharge: 2021-02-08 | Disposition: A | Discharge status: Home / Self Care | Payer: BC Managed Care – PPO | Attending: Emergency Medicine | Admitting: Emergency Medicine

## 2021-02-08 DIAGNOSIS — N201 Calculus of ureter: Secondary | ICD-10-CM | POA: Diagnosis not present

## 2021-02-08 DIAGNOSIS — M549 Dorsalgia, unspecified: Secondary | ICD-10-CM | POA: Insufficient documentation

## 2021-02-08 DIAGNOSIS — N132 Hydronephrosis with renal and ureteral calculous obstruction: Secondary | ICD-10-CM | POA: Diagnosis not present

## 2021-02-08 DIAGNOSIS — R109 Unspecified abdominal pain: Secondary | ICD-10-CM | POA: Diagnosis not present

## 2021-02-08 DIAGNOSIS — K429 Umbilical hernia without obstruction or gangrene: Secondary | ICD-10-CM | POA: Diagnosis not present

## 2021-02-08 DIAGNOSIS — K449 Diaphragmatic hernia without obstruction or gangrene: Secondary | ICD-10-CM | POA: Diagnosis not present

## 2021-02-08 LAB — CBC WITH DIFFERENTIAL/PLATELET
Abs Immature Granulocytes: 0.03 10*3/uL (ref 0.00–0.07)
Basophils Absolute: 0 10*3/uL (ref 0.0–0.1)
Basophils Relative: 0 %
Eosinophils Absolute: 0.3 10*3/uL (ref 0.0–0.5)
Eosinophils Relative: 3 %
HCT: 42.9 % (ref 36.0–46.0)
Hemoglobin: 14.3 g/dL (ref 12.0–15.0)
Immature Granulocytes: 0 %
Lymphocytes Relative: 35 %
Lymphs Abs: 3.6 10*3/uL (ref 0.7–4.0)
MCH: 29.7 pg (ref 26.0–34.0)
MCHC: 33.3 g/dL (ref 30.0–36.0)
MCV: 89 fL (ref 80.0–100.0)
Monocytes Absolute: 0.8 10*3/uL (ref 0.1–1.0)
Monocytes Relative: 8 %
Neutro Abs: 5.3 10*3/uL (ref 1.7–7.7)
Neutrophils Relative %: 54 %
Platelets: 297 10*3/uL (ref 150–400)
RBC: 4.82 MIL/uL (ref 3.87–5.11)
RDW: 13.6 % (ref 11.5–15.5)
WBC: 10.1 10*3/uL (ref 4.0–10.5)
nRBC: 0 % (ref 0.0–0.2)

## 2021-02-08 LAB — COMPREHENSIVE METABOLIC PANEL
ALT: 17 U/L (ref 0–44)
AST: 22 U/L (ref 15–41)
Albumin: 3.5 g/dL (ref 3.5–5.0)
Alkaline Phosphatase: 58 U/L (ref 38–126)
Anion gap: 6 (ref 5–15)
BUN: 12 mg/dL (ref 6–20)
CO2: 27 mmol/L (ref 22–32)
Calcium: 9.2 mg/dL (ref 8.9–10.3)
Chloride: 103 mmol/L (ref 98–111)
Creatinine, Ser: 0.92 mg/dL (ref 0.44–1.00)
GFR, Estimated: >60 mL/min (ref >60)
Glucose, Bld: 92 mg/dL (ref 70–99)
Potassium: 4.1 mmol/L (ref 3.5–5.1)
Sodium: 136 mmol/L (ref 135–145)
Total Bilirubin: 0.3 mg/dL (ref 0.3–1.2)
Total Protein: 7.2 g/dL (ref 6.5–8.1)

## 2021-02-08 LAB — URINALYSIS, ROUTINE W REFLEX MICROSCOPIC
Bacteria, UA: NONE SEEN
Bilirubin Urine: NEGATIVE
Glucose, UA: 50 mg/dL — AB
Ketones, ur: NEGATIVE mg/dL
Nitrite: NEGATIVE
Protein, ur: 100 mg/dL — AB
RBC / HPF: >50 RBC/hpf — ABNORMAL HIGH (ref 0–5)
Specific Gravity, Urine: 1.025 (ref 1.005–1.030)
WBC, UA: >50 WBC/hpf — ABNORMAL HIGH (ref 0–5)
pH: 5 (ref 5.0–8.0)

## 2021-02-08 MED ORDER — ONDANSETRON HCL 4 MG/2ML IJ SOLN: 4.0000 mg | Freq: Once | INTRAMUSCULAR | Status: DC

## 2021-02-08 MED ORDER — ONDANSETRON 4 MG PO TBDP
8.0000 mg | ORAL_TABLET | Freq: Once | ORAL | Status: AC
  Administered 2021-02-08: 8 mg via ORAL
  Filled 2021-02-08: qty 2

## 2021-02-08 MED ORDER — HYDROMORPHONE HCL 1 MG/ML IJ SOLN
1.0000 mg | Freq: Once | INTRAMUSCULAR | Status: AC
  Administered 2021-02-08: 1 mg via INTRAMUSCULAR
  Filled 2021-02-08: qty 1

## 2021-02-08 MED ORDER — HYDROMORPHONE HCL 1 MG/ML IJ SOLN
1.0000 mg | Freq: Once | INTRAMUSCULAR | Status: DC
Start: 2021-02-08 — End: 2021-02-08

## 2021-02-08 NOTE — ED Provider Notes (Signed)
Emergency Medicine Provider Triage Evaluation Note  Casey [name] , a 50 y.o. female  was evaluated in triage.  Pt complains of progressive worsening right flank pain.  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  Has had lithotripsy in April of this year, has repeat lithotripsy scheduled for August as well as stent removal.  Currently on oxycodone which is no longer helping for her pain.  Did have catheterized urine specimen this week at primary care doctor with negative growth on urine culture.  Endorses chronic hematuria.  States she is here for pain management..  Review of Systems  Positive: Flank pain, hematuria, nausea, vomiting Negative: Fevers, chills, diarrhea, chest pain, shortness of breath, dysuria.  Physical Exam  BP 130/81 (BP Location: Left Arm)   Pulse 81   Temp 97.7 F (36.5 C) (Oral)   Resp 16   Ht 5\' 4"  (1.626 m)   Wt 102.1 kg   SpO2 98%   BMI 38.62 kg/m  Gen:   Awake, no distress, Well-appearing Resp:  Normal effort  MSK:   Moves extremities without difficulty  Other:  Right CVAT, abdomen generally mildly tender on the right side, tender in suprapubic area.  RRR no M/R/G.  Medical Decision Making  Medically screening exam initiated at 6:36 PM.  Appropriate orders placed.  Casey [name] was informed that the remainder of the evaluation will be completed by another provider, this initial triage assessment does not replace that evaluation, and the importance of remaining in the ED until their evaluation is complete.  This chart was dictated using voice recognition software, Dragon. Despite the best efforts of this provider to proofread and correct errors, errors may still occur which can change documentation meaning.    [name] 02/08/21 04/11/21    Paulo Fruit, MD 02/08/21 2137

## 2021-02-08 NOTE — ED Triage Notes (Signed)
Pt c/o kidney stone since Mar, lithotripsy in Apr, stent placed, pain is "unmanageable." Follow-up appt for removal in August States prescription Oxy only brings pain down to 7. Baseline pain for hx of RA is 3-4 Visited PCP Friday, cathed to ensure no additional issue, information available on mychart

## 2021-02-08 NOTE — ED Notes (Signed)
Patient transported to CT 

## 2021-02-08 NOTE — ED Provider Notes (Signed)
[facility] EMERGENCY DEPARTMENT Provider Note   CSN: [id number] Arrival date & time: 02/08/21  1743     History Chief Complaint  Patient presents with   Abdominal Pain   Back Pain    Casey [name] is a 50 y.o. female.  Right ureteral stent placement at [facility] 01/14/21. Scheduled to see Dr. Esperanza Sheets in early August (also at [facility]). Dr. Myles Lipps was seeing her, and she has a history of a ureteral stent for the same issue 4/22.  She presents today stating that her oxycodone 5 mg is no longer relieving her pain.  She is taking this 3-4 times per day.  She saw her primary care physician on 02/06/2021, and she was given oxycodone.  She was called by this practice today to inform her that her urine culture which was taken the day of her visit, did not reveal bacterial growth.  However, she endorsed significant pain, and she was directed to the emergency department.  Pain is in her right flank and is consistent with her months long pain.  She endorses nausea, vomiting, and fever to 100.5.  She is also had somewhat chronic hematuria.  The history is provided by the patient.  Abdominal Pain Pain location:  R flank Pain quality: stabbing   Pain radiates to:  RLQ Pain severity:  Severe Onset quality:  Gradual Duration: 3 months. Timing:  Constant Progression:  Worsening Chronicity:  New Context comment:  Chronic right flank pain Relieved by:  Nothing Worsened by:  Nothing Ineffective treatments:  None tried Associated symptoms: fever, hematuria, nausea and vomiting   Associated symptoms: no chest pain, no chills, no cough, no dysuria, no shortness of breath and no sore throat   Back Pain Associated symptoms: abdominal pain and fever   Associated symptoms: no chest pain and no dysuria       No past medical history on file.  There are no problems to display for this patient.    OB History   No obstetric history on file.     No family history on  file.     Home Medications Prior to Admission medications   Not on File    Allergies    Advair hfa [fluticasone-salmeterol], Asa [aspirin], Cleocin [clindamycin], Clindamycin/lincomycin, Fludrocortisone acetate, Gabapentin, Hydrochlorothiazide, Imipramine, Ipratropium bromide, Medroxyprogesterone, Meloxicam, Metformin and related, Nystatin, Penicillins, Pred forte [prednisolone], Prednisone, Proventil hfa [albuterol], Solu-medrol [methylprednisolone], Sulfa antibiotics, Tegaderm ag mesh [silver], Toradol [ketorolac tromethamine], and Tramadol  Review of Systems   Review of Systems  Constitutional:  Positive for fever. Negative for chills.  HENT:  Negative for ear pain and sore throat.   Eyes:  Negative for pain and visual disturbance.  Respiratory:  Negative for cough and shortness of breath.   Cardiovascular:  Negative for chest pain and palpitations.  Gastrointestinal:  Positive for abdominal pain, nausea and vomiting.  Genitourinary:  Positive for hematuria. Negative for dysuria.  Musculoskeletal:  Positive for back pain. Negative for arthralgias.  Skin:  Negative for color change and rash.  Neurological:  Negative for seizures and syncope.  All other systems reviewed and are negative.  Physical Exam Updated Vital Signs BP 130/81 (BP Location: Left Arm)   Pulse 81   Temp 97.7 F (36.5 C) (Oral)   Resp 16   Ht 5\' 4"  (1.626 m)   Wt 102.1 kg   SpO2 98%   BMI 38.62 kg/m   Physical Exam Vitals and nursing note reviewed.  HENT:  Head: Normocephalic and atraumatic.  Eyes:     General: No scleral icterus. Pulmonary:     Effort: Pulmonary effort is normal. No respiratory distress.  Abdominal:     Tenderness: There is right CVA tenderness. There is no guarding.  Musculoskeletal:     Cervical back: Normal range of motion.  Skin:    General: Skin is warm and dry.  Neurological:     Mental Status: She is alert.  Psychiatric:        Mood and Affect: Mood normal.     ED Results / Procedures / Treatments   Labs (all labs ordered are listed, but only abnormal results are displayed) Labs Reviewed  URINALYSIS, ROUTINE W REFLEX MICROSCOPIC - Abnormal; Notable for the following components:      Result Value   Color, Urine RED (*)    APPearance CLOUDY (*)    Glucose, UA 50 (*)    Hgb urine dipstick LARGE (*)    Protein, ur 100 (*)    Leukocytes,Ua TRACE (*)    RBC / HPF >50 (*)    WBC, UA >50 (*)    All other components within normal limits  URINE CULTURE  COMPREHENSIVE METABOLIC PANEL  CBC WITH DIFFERENTIAL/PLATELET    EKG None  Radiology CT Renal Stone Study  Result Date: 02/08/2021 CLINICAL DATA:  Fever and abdominal pain.  Status post lithotripsy. EXAM: CT ABDOMEN AND PELVIS WITHOUT CONTRAST TECHNIQUE: Multidetector CT imaging of the abdomen and pelvis was performed following the standard protocol without IV contrast. COMPARISON:  02/02/2021 FINDINGS: LOWER CHEST: Normal. HEPATOBILIARY: Normal hepatic contours. No intra- or extrahepatic biliary dilatation. The gallbladder is normal. PANCREAS: Normal pancreas. No ductal dilatation or peripancreatic fluid collection. SPLEEN: Normal. ADRENALS/URINARY TRACT: The adrenal glands are normal. Right nephroureteral stent with mild hydronephrosis. Mild proximal periureteral stranding. No left hydronephrosis or obstructive nephrolithiasis. There is a punctate calcification in the interpolar left kidney. The urinary bladder is normal for degree of distention STOMACH/BOWEL: Small hiatal hernia. Postsurgical changes of sleeve gastrectomy. No small bowel dilatation or inflammation. No focal colonic abnormality. The appendix is not visualized. No right lower quadrant inflammation or free fluid. VASCULAR/LYMPHATIC: Normal course and caliber of the major abdominal vessels. No abdominal or pelvic lymphadenopathy. REPRODUCTIVE: Normal uterus. No adnexal mass. MUSCULOSKELETAL. No bony spinal canal stenosis or focal osseous  abnormality. OTHER: Small fat containing umbilical hernia. IMPRESSION: 1. Status post right nephroureteral stent with unchanged mild hydronephrosis and proximal periureteral stranding. 2. Punctate nonobstructing left renal calculus. Electronically Signed   By: Deatra Robinson M.D.   On: 02/08/2021 20:59    Procedures Procedures   Medications Ordered in ED Medications  HYDROmorphone (DILAUDID) injection 1 mg (has no administration in time range)  ondansetron (ZOFRAN) injection 4 mg (has no administration in time range)    ED Course  I have reviewed the triage vital signs and the nursing notes.  Pertinent labs & imaging results that were available during my care of the patient were reviewed by me and considered in my medical decision making (see chart for details).    MDM Rules/Calculators/A&P                          [name] presents with ongoing right flank pain from a ureteral stone.  She has established care with [facility].  Unfortunately, she has presented here tonight complaining of worsening pain, fevers, and hematuria.  This is a presentation very  similar to presentation she has had in multiple different ED's over the past few weeks and months.  Urinalysis was equivocal for infection and will be cultured.  She has had numerous recent antibiotic treatments, and I do not think it is in her best interest to empirically start antibiotics.  Unfortunately, I think she will require a CT scan.  This is unfortunate since she has had numerous images in the past.  However, it is difficult to fully evaluate her worsened pain without imaging.  CT scan reassuring.  Patient advised that her pain is somewhat chronic, and she will not be prescribed further pain medication from the ED.  She will need to contact her primary care doctor and/or her urologist. Final Clinical Impression(s) / ED Diagnoses Final diagnoses:  Right ureteral stone    Rx / DC Orders ED Discharge  Orders     None        Koleen Distance, MD 02/08/21 2112

## 2021-02-08 NOTE — ED Triage Notes (Signed)
Pt endorses low-grade fever today, chills, nausea, no vomiting

## 2021-02-10 LAB — URINE CULTURE

## 2021-02-20 DIAGNOSIS — N200 Calculus of kidney: Secondary | ICD-10-CM | POA: Diagnosis not present

## 2021-02-20 DIAGNOSIS — M0609 Rheumatoid arthritis without rheumatoid factor, multiple sites: Secondary | ICD-10-CM | POA: Diagnosis not present

## 2021-03-02 DIAGNOSIS — M25561 Pain in right knee: Secondary | ICD-10-CM | POA: Diagnosis not present

## 2021-03-02 DIAGNOSIS — M25562 Pain in left knee: Secondary | ICD-10-CM | POA: Diagnosis not present

## 2021-03-10 DIAGNOSIS — N132 Hydronephrosis with renal and ureteral calculous obstruction: Secondary | ICD-10-CM | POA: Diagnosis not present

## 2021-03-10 DIAGNOSIS — N201 Calculus of ureter: Secondary | ICD-10-CM | POA: Diagnosis not present

## 2021-03-17 DIAGNOSIS — Z903 Acquired absence of stomach [part of]: Secondary | ICD-10-CM | POA: Diagnosis not present

## 2021-03-17 DIAGNOSIS — K219 Gastro-esophageal reflux disease without esophagitis: Secondary | ICD-10-CM | POA: Diagnosis not present

## 2021-03-17 DIAGNOSIS — K449 Diaphragmatic hernia without obstruction or gangrene: Secondary | ICD-10-CM | POA: Diagnosis not present

## 2021-03-17 DIAGNOSIS — Z9884 Bariatric surgery status: Secondary | ICD-10-CM | POA: Diagnosis not present

## 2021-03-20 DIAGNOSIS — M0609 Rheumatoid arthritis without rheumatoid factor, multiple sites: Secondary | ICD-10-CM | POA: Diagnosis not present

## 2021-03-23 DIAGNOSIS — N3281 Overactive bladder: Secondary | ICD-10-CM | POA: Diagnosis not present

## 2021-03-23 DIAGNOSIS — Z960 Presence of urogenital implants: Secondary | ICD-10-CM | POA: Diagnosis not present

## 2021-03-23 DIAGNOSIS — K219 Gastro-esophageal reflux disease without esophagitis: Secondary | ICD-10-CM | POA: Diagnosis not present

## 2021-03-23 DIAGNOSIS — N200 Calculus of kidney: Secondary | ICD-10-CM | POA: Diagnosis not present

## 2021-03-27 DIAGNOSIS — N200 Calculus of kidney: Secondary | ICD-10-CM | POA: Diagnosis not present

## 2021-03-27 DIAGNOSIS — Z87442 Personal history of urinary calculi: Secondary | ICD-10-CM | POA: Diagnosis not present

## 2021-03-27 DIAGNOSIS — Z466 Encounter for fitting and adjustment of urinary device: Secondary | ICD-10-CM | POA: Diagnosis not present

## 2021-04-03 DIAGNOSIS — M9903 Segmental and somatic dysfunction of lumbar region: Secondary | ICD-10-CM | POA: Diagnosis not present

## 2021-04-03 DIAGNOSIS — M5136 Other intervertebral disc degeneration, lumbar region: Secondary | ICD-10-CM | POA: Diagnosis not present

## 2021-04-03 DIAGNOSIS — M5451 Vertebrogenic low back pain: Secondary | ICD-10-CM | POA: Diagnosis not present

## 2021-04-03 DIAGNOSIS — M9901 Segmental and somatic dysfunction of cervical region: Secondary | ICD-10-CM | POA: Diagnosis not present

## 2021-04-06 DIAGNOSIS — M5451 Vertebrogenic low back pain: Secondary | ICD-10-CM | POA: Diagnosis not present

## 2021-04-06 DIAGNOSIS — M461 Sacroiliitis, not elsewhere classified: Secondary | ICD-10-CM | POA: Diagnosis not present

## 2021-04-06 DIAGNOSIS — M9903 Segmental and somatic dysfunction of lumbar region: Secondary | ICD-10-CM | POA: Diagnosis not present

## 2021-04-06 DIAGNOSIS — M9905 Segmental and somatic dysfunction of pelvic region: Secondary | ICD-10-CM | POA: Diagnosis not present

## 2021-04-10 DIAGNOSIS — M9903 Segmental and somatic dysfunction of lumbar region: Secondary | ICD-10-CM | POA: Diagnosis not present

## 2021-04-10 DIAGNOSIS — M5451 Vertebrogenic low back pain: Secondary | ICD-10-CM | POA: Diagnosis not present

## 2021-04-10 DIAGNOSIS — M9905 Segmental and somatic dysfunction of pelvic region: Secondary | ICD-10-CM | POA: Diagnosis not present

## 2021-04-10 DIAGNOSIS — M461 Sacroiliitis, not elsewhere classified: Secondary | ICD-10-CM | POA: Diagnosis not present

## 2021-04-20 DIAGNOSIS — M545 Low back pain, unspecified: Secondary | ICD-10-CM | POA: Diagnosis not present

## 2021-04-20 DIAGNOSIS — M25561 Pain in right knee: Secondary | ICD-10-CM | POA: Diagnosis not present

## 2021-04-22 DIAGNOSIS — M25561 Pain in right knee: Secondary | ICD-10-CM | POA: Diagnosis not present

## 2021-04-22 DIAGNOSIS — Z96651 Presence of right artificial knee joint: Secondary | ICD-10-CM | POA: Diagnosis not present

## 2021-04-24 DIAGNOSIS — M0609 Rheumatoid arthritis without rheumatoid factor, multiple sites: Secondary | ICD-10-CM | POA: Diagnosis not present

## 2021-04-24 DIAGNOSIS — Z79899 Other long term (current) drug therapy: Secondary | ICD-10-CM | POA: Diagnosis not present

## 2021-05-01 DIAGNOSIS — Z96651 Presence of right artificial knee joint: Secondary | ICD-10-CM | POA: Diagnosis not present

## 2021-05-01 DIAGNOSIS — M25561 Pain in right knee: Secondary | ICD-10-CM | POA: Diagnosis not present

## 2021-05-01 DIAGNOSIS — M1712 Unilateral primary osteoarthritis, left knee: Secondary | ICD-10-CM | POA: Diagnosis not present

## 2021-05-15 DIAGNOSIS — M461 Sacroiliitis, not elsewhere classified: Secondary | ICD-10-CM | POA: Diagnosis not present

## 2021-05-15 DIAGNOSIS — M9903 Segmental and somatic dysfunction of lumbar region: Secondary | ICD-10-CM | POA: Diagnosis not present

## 2021-05-15 DIAGNOSIS — M9905 Segmental and somatic dysfunction of pelvic region: Secondary | ICD-10-CM | POA: Diagnosis not present

## 2021-05-15 DIAGNOSIS — M5451 Vertebrogenic low back pain: Secondary | ICD-10-CM | POA: Diagnosis not present

## 2021-05-17 DIAGNOSIS — M25461 Effusion, right knee: Secondary | ICD-10-CM | POA: Diagnosis not present

## 2021-05-17 DIAGNOSIS — E119 Type 2 diabetes mellitus without complications: Secondary | ICD-10-CM | POA: Diagnosis not present

## 2021-05-17 DIAGNOSIS — I10 Essential (primary) hypertension: Secondary | ICD-10-CM | POA: Diagnosis not present

## 2021-05-17 DIAGNOSIS — R5383 Other fatigue: Secondary | ICD-10-CM | POA: Diagnosis not present

## 2021-05-17 DIAGNOSIS — M25561 Pain in right knee: Secondary | ICD-10-CM | POA: Diagnosis not present

## 2021-05-17 DIAGNOSIS — Z96651 Presence of right artificial knee joint: Secondary | ICD-10-CM | POA: Diagnosis not present

## 2021-05-22 DIAGNOSIS — M0609 Rheumatoid arthritis without rheumatoid factor, multiple sites: Secondary | ICD-10-CM | POA: Diagnosis not present

## 2021-05-29 DIAGNOSIS — N200 Calculus of kidney: Secondary | ICD-10-CM | POA: Diagnosis not present

## 2021-06-01 DIAGNOSIS — M9905 Segmental and somatic dysfunction of pelvic region: Secondary | ICD-10-CM | POA: Diagnosis not present

## 2021-06-01 DIAGNOSIS — M461 Sacroiliitis, not elsewhere classified: Secondary | ICD-10-CM | POA: Diagnosis not present

## 2021-06-01 DIAGNOSIS — M9903 Segmental and somatic dysfunction of lumbar region: Secondary | ICD-10-CM | POA: Diagnosis not present

## 2021-06-01 DIAGNOSIS — M5451 Vertebrogenic low back pain: Secondary | ICD-10-CM | POA: Diagnosis not present

## 2021-06-05 DIAGNOSIS — G8929 Other chronic pain: Secondary | ICD-10-CM | POA: Diagnosis not present

## 2021-06-05 DIAGNOSIS — M25561 Pain in right knee: Secondary | ICD-10-CM | POA: Diagnosis not present

## 2021-06-05 DIAGNOSIS — Z96651 Presence of right artificial knee joint: Secondary | ICD-10-CM | POA: Diagnosis not present

## 2021-06-05 DIAGNOSIS — N200 Calculus of kidney: Secondary | ICD-10-CM | POA: Diagnosis not present

## 2021-06-05 DIAGNOSIS — N1330 Unspecified hydronephrosis: Secondary | ICD-10-CM | POA: Diagnosis not present

## 2021-06-08 DIAGNOSIS — M9903 Segmental and somatic dysfunction of lumbar region: Secondary | ICD-10-CM | POA: Diagnosis not present

## 2021-06-08 DIAGNOSIS — M461 Sacroiliitis, not elsewhere classified: Secondary | ICD-10-CM | POA: Diagnosis not present

## 2021-06-08 DIAGNOSIS — M9905 Segmental and somatic dysfunction of pelvic region: Secondary | ICD-10-CM | POA: Diagnosis not present

## 2021-06-08 DIAGNOSIS — M5451 Vertebrogenic low back pain: Secondary | ICD-10-CM | POA: Diagnosis not present

## 2021-06-15 DIAGNOSIS — M9903 Segmental and somatic dysfunction of lumbar region: Secondary | ICD-10-CM | POA: Diagnosis not present

## 2021-06-15 DIAGNOSIS — M9905 Segmental and somatic dysfunction of pelvic region: Secondary | ICD-10-CM | POA: Diagnosis not present

## 2021-06-15 DIAGNOSIS — M5451 Vertebrogenic low back pain: Secondary | ICD-10-CM | POA: Diagnosis not present

## 2021-06-15 DIAGNOSIS — M461 Sacroiliitis, not elsewhere classified: Secondary | ICD-10-CM | POA: Diagnosis not present

## 2021-06-17 DIAGNOSIS — G43009 Migraine without aura, not intractable, without status migrainosus: Secondary | ICD-10-CM | POA: Diagnosis not present

## 2021-06-17 DIAGNOSIS — J019 Acute sinusitis, unspecified: Secondary | ICD-10-CM | POA: Diagnosis not present

## 2021-06-19 DIAGNOSIS — M0609 Rheumatoid arthritis without rheumatoid factor, multiple sites: Secondary | ICD-10-CM | POA: Diagnosis not present

## 2021-06-19 DIAGNOSIS — R5383 Other fatigue: Secondary | ICD-10-CM | POA: Diagnosis not present

## 2021-06-19 DIAGNOSIS — Z111 Encounter for screening for respiratory tuberculosis: Secondary | ICD-10-CM | POA: Diagnosis not present

## 2021-06-19 DIAGNOSIS — Z79899 Other long term (current) drug therapy: Secondary | ICD-10-CM | POA: Diagnosis not present

## 2021-06-22 DIAGNOSIS — M461 Sacroiliitis, not elsewhere classified: Secondary | ICD-10-CM | POA: Diagnosis not present

## 2021-06-22 DIAGNOSIS — M9903 Segmental and somatic dysfunction of lumbar region: Secondary | ICD-10-CM | POA: Diagnosis not present

## 2021-06-22 DIAGNOSIS — N1330 Unspecified hydronephrosis: Secondary | ICD-10-CM | POA: Diagnosis not present

## 2021-06-22 DIAGNOSIS — M9905 Segmental and somatic dysfunction of pelvic region: Secondary | ICD-10-CM | POA: Diagnosis not present

## 2021-06-22 DIAGNOSIS — M5451 Vertebrogenic low back pain: Secondary | ICD-10-CM | POA: Diagnosis not present

## 2021-06-22 DIAGNOSIS — N1339 Other hydronephrosis: Secondary | ICD-10-CM | POA: Diagnosis not present

## 2021-06-25 DIAGNOSIS — N12 Tubulo-interstitial nephritis, not specified as acute or chronic: Secondary | ICD-10-CM | POA: Diagnosis not present

## 2021-06-25 DIAGNOSIS — R109 Unspecified abdominal pain: Secondary | ICD-10-CM | POA: Diagnosis not present

## 2021-06-25 DIAGNOSIS — N200 Calculus of kidney: Secondary | ICD-10-CM | POA: Diagnosis not present

## 2021-06-25 DIAGNOSIS — K449 Diaphragmatic hernia without obstruction or gangrene: Secondary | ICD-10-CM | POA: Diagnosis not present

## 2021-06-30 DIAGNOSIS — M5451 Vertebrogenic low back pain: Secondary | ICD-10-CM | POA: Diagnosis not present

## 2021-06-30 DIAGNOSIS — M461 Sacroiliitis, not elsewhere classified: Secondary | ICD-10-CM | POA: Diagnosis not present

## 2021-06-30 DIAGNOSIS — M9903 Segmental and somatic dysfunction of lumbar region: Secondary | ICD-10-CM | POA: Diagnosis not present

## 2021-06-30 DIAGNOSIS — M9905 Segmental and somatic dysfunction of pelvic region: Secondary | ICD-10-CM | POA: Diagnosis not present

## 2021-07-03 DIAGNOSIS — N200 Calculus of kidney: Secondary | ICD-10-CM | POA: Diagnosis not present

## 2021-07-06 DIAGNOSIS — M9903 Segmental and somatic dysfunction of lumbar region: Secondary | ICD-10-CM | POA: Diagnosis not present

## 2021-07-06 DIAGNOSIS — M9905 Segmental and somatic dysfunction of pelvic region: Secondary | ICD-10-CM | POA: Diagnosis not present

## 2021-07-06 DIAGNOSIS — M5451 Vertebrogenic low back pain: Secondary | ICD-10-CM | POA: Diagnosis not present

## 2021-07-06 DIAGNOSIS — M461 Sacroiliitis, not elsewhere classified: Secondary | ICD-10-CM | POA: Diagnosis not present

## 2021-07-07 DIAGNOSIS — S63502A Unspecified sprain of left wrist, initial encounter: Secondary | ICD-10-CM | POA: Diagnosis not present

## 2021-07-07 DIAGNOSIS — M7989 Other specified soft tissue disorders: Secondary | ICD-10-CM | POA: Diagnosis not present

## 2021-07-07 DIAGNOSIS — M25532 Pain in left wrist: Secondary | ICD-10-CM | POA: Diagnosis not present

## 2021-07-07 DIAGNOSIS — W2203XA Walked into furniture, initial encounter: Secondary | ICD-10-CM | POA: Diagnosis not present

## 2021-07-08 DIAGNOSIS — K449 Diaphragmatic hernia without obstruction or gangrene: Secondary | ICD-10-CM | POA: Diagnosis not present

## 2021-07-08 DIAGNOSIS — D126 Benign neoplasm of colon, unspecified: Secondary | ICD-10-CM | POA: Diagnosis not present

## 2021-07-08 DIAGNOSIS — M19032 Primary osteoarthritis, left wrist: Secondary | ICD-10-CM | POA: Diagnosis not present

## 2021-07-08 DIAGNOSIS — M25532 Pain in left wrist: Secondary | ICD-10-CM | POA: Diagnosis not present

## 2021-07-08 DIAGNOSIS — S60212A Contusion of left wrist, initial encounter: Secondary | ICD-10-CM

## 2021-07-08 DIAGNOSIS — W228XXA Striking against or struck by other objects, initial encounter: Secondary | ICD-10-CM | POA: Diagnosis not present

## 2021-07-08 DIAGNOSIS — K5900 Constipation, unspecified: Secondary | ICD-10-CM | POA: Diagnosis not present

## 2021-07-08 DIAGNOSIS — Z96651 Presence of right artificial knee joint: Secondary | ICD-10-CM | POA: Diagnosis not present

## 2021-07-08 DIAGNOSIS — Z903 Acquired absence of stomach [part of]: Secondary | ICD-10-CM | POA: Diagnosis not present

## 2021-07-08 DIAGNOSIS — K219 Gastro-esophageal reflux disease without esophagitis: Secondary | ICD-10-CM | POA: Diagnosis not present

## 2021-07-08 DIAGNOSIS — R140 Abdominal distension (gaseous): Secondary | ICD-10-CM | POA: Diagnosis not present

## 2021-07-08 DIAGNOSIS — G8929 Other chronic pain: Secondary | ICD-10-CM | POA: Diagnosis not present

## 2021-07-08 DIAGNOSIS — M25561 Pain in right knee: Secondary | ICD-10-CM | POA: Diagnosis not present

## 2021-07-08 DIAGNOSIS — R112 Nausea with vomiting, unspecified: Secondary | ICD-10-CM | POA: Diagnosis not present

## 2021-07-08 HISTORY — DX: Contusion of left wrist, initial encounter: S60.212A

## 2021-07-17 DIAGNOSIS — M0609 Rheumatoid arthritis without rheumatoid factor, multiple sites: Secondary | ICD-10-CM | POA: Diagnosis not present

## 2021-07-27 DIAGNOSIS — M9905 Segmental and somatic dysfunction of pelvic region: Secondary | ICD-10-CM | POA: Diagnosis not present

## 2021-07-27 DIAGNOSIS — M9903 Segmental and somatic dysfunction of lumbar region: Secondary | ICD-10-CM | POA: Diagnosis not present

## 2021-07-27 DIAGNOSIS — M461 Sacroiliitis, not elsewhere classified: Secondary | ICD-10-CM | POA: Diagnosis not present

## 2021-07-27 DIAGNOSIS — S39012A Strain of muscle, fascia and tendon of lower back, initial encounter: Secondary | ICD-10-CM | POA: Diagnosis not present

## 2021-07-31 DIAGNOSIS — M19032 Primary osteoarthritis, left wrist: Secondary | ICD-10-CM | POA: Diagnosis not present

## 2021-07-31 DIAGNOSIS — M654 Radial styloid tenosynovitis [de Quervain]: Secondary | ICD-10-CM

## 2021-07-31 DIAGNOSIS — M1812 Unilateral primary osteoarthritis of first carpometacarpal joint, left hand: Secondary | ICD-10-CM | POA: Diagnosis not present

## 2021-07-31 DIAGNOSIS — M1810 Unilateral primary osteoarthritis of first carpometacarpal joint, unspecified hand: Secondary | ICD-10-CM | POA: Diagnosis not present

## 2021-07-31 DIAGNOSIS — M25532 Pain in left wrist: Secondary | ICD-10-CM | POA: Diagnosis not present

## 2021-07-31 HISTORY — DX: Radial styloid tenosynovitis (de quervain): M65.4

## 2021-08-04 DIAGNOSIS — M9905 Segmental and somatic dysfunction of pelvic region: Secondary | ICD-10-CM | POA: Diagnosis not present

## 2021-08-04 DIAGNOSIS — M461 Sacroiliitis, not elsewhere classified: Secondary | ICD-10-CM | POA: Diagnosis not present

## 2021-08-04 DIAGNOSIS — M9903 Segmental and somatic dysfunction of lumbar region: Secondary | ICD-10-CM | POA: Diagnosis not present

## 2021-08-04 DIAGNOSIS — S39012A Strain of muscle, fascia and tendon of lower back, initial encounter: Secondary | ICD-10-CM | POA: Diagnosis not present

## 2021-08-07 DIAGNOSIS — M654 Radial styloid tenosynovitis [de Quervain]: Secondary | ICD-10-CM | POA: Diagnosis not present

## 2021-08-07 DIAGNOSIS — M25642 Stiffness of left hand, not elsewhere classified: Secondary | ICD-10-CM | POA: Diagnosis not present

## 2021-08-07 DIAGNOSIS — M6281 Muscle weakness (generalized): Secondary | ICD-10-CM | POA: Diagnosis not present

## 2021-08-07 DIAGNOSIS — M25532 Pain in left wrist: Secondary | ICD-10-CM | POA: Diagnosis not present

## 2021-08-11 DIAGNOSIS — M25642 Stiffness of left hand, not elsewhere classified: Secondary | ICD-10-CM | POA: Diagnosis not present

## 2021-08-11 DIAGNOSIS — M25532 Pain in left wrist: Secondary | ICD-10-CM | POA: Diagnosis not present

## 2021-08-11 DIAGNOSIS — M6281 Muscle weakness (generalized): Secondary | ICD-10-CM | POA: Diagnosis not present

## 2021-08-11 DIAGNOSIS — M654 Radial styloid tenosynovitis [de Quervain]: Secondary | ICD-10-CM | POA: Diagnosis not present

## 2021-08-14 DIAGNOSIS — M0609 Rheumatoid arthritis without rheumatoid factor, multiple sites: Secondary | ICD-10-CM | POA: Diagnosis not present

## 2021-08-21 DIAGNOSIS — M654 Radial styloid tenosynovitis [de Quervain]: Secondary | ICD-10-CM | POA: Diagnosis not present

## 2021-08-21 DIAGNOSIS — M6281 Muscle weakness (generalized): Secondary | ICD-10-CM | POA: Diagnosis not present

## 2021-08-21 DIAGNOSIS — M25532 Pain in left wrist: Secondary | ICD-10-CM | POA: Diagnosis not present

## 2021-08-21 DIAGNOSIS — M25642 Stiffness of left hand, not elsewhere classified: Secondary | ICD-10-CM | POA: Diagnosis not present

## 2021-08-24 DIAGNOSIS — M25642 Stiffness of left hand, not elsewhere classified: Secondary | ICD-10-CM | POA: Diagnosis not present

## 2021-08-24 DIAGNOSIS — M6281 Muscle weakness (generalized): Secondary | ICD-10-CM | POA: Diagnosis not present

## 2021-08-24 DIAGNOSIS — M654 Radial styloid tenosynovitis [de Quervain]: Secondary | ICD-10-CM | POA: Diagnosis not present

## 2021-08-24 DIAGNOSIS — M25532 Pain in left wrist: Secondary | ICD-10-CM | POA: Diagnosis not present

## 2021-08-27 DIAGNOSIS — M461 Sacroiliitis, not elsewhere classified: Secondary | ICD-10-CM | POA: Diagnosis not present

## 2021-08-27 DIAGNOSIS — M9903 Segmental and somatic dysfunction of lumbar region: Secondary | ICD-10-CM | POA: Diagnosis not present

## 2021-08-27 DIAGNOSIS — S39012A Strain of muscle, fascia and tendon of lower back, initial encounter: Secondary | ICD-10-CM | POA: Diagnosis not present

## 2021-08-27 DIAGNOSIS — M9905 Segmental and somatic dysfunction of pelvic region: Secondary | ICD-10-CM | POA: Diagnosis not present

## 2021-09-08 DIAGNOSIS — S39012A Strain of muscle, fascia and tendon of lower back, initial encounter: Secondary | ICD-10-CM | POA: Diagnosis not present

## 2021-09-08 DIAGNOSIS — M9905 Segmental and somatic dysfunction of pelvic region: Secondary | ICD-10-CM | POA: Diagnosis not present

## 2021-09-08 DIAGNOSIS — M461 Sacroiliitis, not elsewhere classified: Secondary | ICD-10-CM | POA: Diagnosis not present

## 2021-09-08 DIAGNOSIS — M9903 Segmental and somatic dysfunction of lumbar region: Secondary | ICD-10-CM | POA: Diagnosis not present

## 2021-09-11 DIAGNOSIS — M0609 Rheumatoid arthritis without rheumatoid factor, multiple sites: Secondary | ICD-10-CM | POA: Diagnosis not present

## 2021-09-14 DIAGNOSIS — M0609 Rheumatoid arthritis without rheumatoid factor, multiple sites: Secondary | ICD-10-CM | POA: Diagnosis not present

## 2021-09-14 DIAGNOSIS — M150 Primary generalized (osteo)arthritis: Secondary | ICD-10-CM | POA: Diagnosis not present

## 2021-09-14 DIAGNOSIS — M2550 Pain in unspecified joint: Secondary | ICD-10-CM | POA: Diagnosis not present

## 2021-09-14 DIAGNOSIS — M797 Fibromyalgia: Secondary | ICD-10-CM | POA: Diagnosis not present

## 2021-09-15 DIAGNOSIS — M9905 Segmental and somatic dysfunction of pelvic region: Secondary | ICD-10-CM | POA: Diagnosis not present

## 2021-09-15 DIAGNOSIS — S39012A Strain of muscle, fascia and tendon of lower back, initial encounter: Secondary | ICD-10-CM | POA: Diagnosis not present

## 2021-09-15 DIAGNOSIS — M461 Sacroiliitis, not elsewhere classified: Secondary | ICD-10-CM | POA: Diagnosis not present

## 2021-09-15 DIAGNOSIS — M9903 Segmental and somatic dysfunction of lumbar region: Secondary | ICD-10-CM | POA: Diagnosis not present

## 2021-09-22 DIAGNOSIS — J029 Acute pharyngitis, unspecified: Secondary | ICD-10-CM | POA: Diagnosis not present

## 2021-09-25 DIAGNOSIS — N12 Tubulo-interstitial nephritis, not specified as acute or chronic: Secondary | ICD-10-CM

## 2021-09-25 HISTORY — DX: Tubulo-interstitial nephritis, not specified as acute or chronic: N12

## 2021-10-05 DIAGNOSIS — R1011 Right upper quadrant pain: Secondary | ICD-10-CM | POA: Diagnosis not present

## 2021-10-05 DIAGNOSIS — N132 Hydronephrosis with renal and ureteral calculous obstruction: Secondary | ICD-10-CM | POA: Diagnosis not present

## 2021-10-05 DIAGNOSIS — K5909 Other constipation: Secondary | ICD-10-CM | POA: Diagnosis not present

## 2021-10-05 DIAGNOSIS — R1031 Right lower quadrant pain: Secondary | ICD-10-CM | POA: Diagnosis not present

## 2021-10-05 DIAGNOSIS — N200 Calculus of kidney: Secondary | ICD-10-CM | POA: Diagnosis not present

## 2021-10-05 DIAGNOSIS — Z20822 Contact with and (suspected) exposure to covid-19: Secondary | ICD-10-CM | POA: Diagnosis not present

## 2021-10-05 DIAGNOSIS — R112 Nausea with vomiting, unspecified: Secondary | ICD-10-CM | POA: Diagnosis not present

## 2021-10-05 DIAGNOSIS — R269 Unspecified abnormalities of gait and mobility: Secondary | ICD-10-CM | POA: Diagnosis not present

## 2021-10-06 DIAGNOSIS — K5900 Constipation, unspecified: Secondary | ICD-10-CM | POA: Diagnosis not present

## 2021-10-06 DIAGNOSIS — K920 Hematemesis: Secondary | ICD-10-CM | POA: Diagnosis not present

## 2021-10-06 DIAGNOSIS — Z87442 Personal history of urinary calculi: Secondary | ICD-10-CM | POA: Diagnosis not present

## 2021-10-06 DIAGNOSIS — R10813 Right lower quadrant abdominal tenderness: Secondary | ICD-10-CM | POA: Diagnosis not present

## 2021-10-06 DIAGNOSIS — R42 Dizziness and giddiness: Secondary | ICD-10-CM | POA: Diagnosis not present

## 2021-10-06 DIAGNOSIS — R1033 Periumbilical pain: Secondary | ICD-10-CM | POA: Diagnosis not present

## 2021-10-06 DIAGNOSIS — R1013 Epigastric pain: Secondary | ICD-10-CM | POA: Diagnosis not present

## 2021-10-08 DIAGNOSIS — N132 Hydronephrosis with renal and ureteral calculous obstruction: Secondary | ICD-10-CM | POA: Diagnosis not present

## 2021-10-08 DIAGNOSIS — K5900 Constipation, unspecified: Secondary | ICD-10-CM | POA: Diagnosis not present

## 2021-10-08 DIAGNOSIS — Z5181 Encounter for therapeutic drug level monitoring: Secondary | ICD-10-CM | POA: Diagnosis not present

## 2021-10-08 DIAGNOSIS — Z903 Acquired absence of stomach [part of]: Secondary | ICD-10-CM | POA: Diagnosis not present

## 2021-10-08 DIAGNOSIS — Z79899 Other long term (current) drug therapy: Secondary | ICD-10-CM | POA: Diagnosis not present

## 2021-10-08 DIAGNOSIS — K589 Irritable bowel syndrome without diarrhea: Secondary | ICD-10-CM | POA: Diagnosis not present

## 2021-10-08 DIAGNOSIS — K449 Diaphragmatic hernia without obstruction or gangrene: Secondary | ICD-10-CM | POA: Diagnosis not present

## 2021-10-08 DIAGNOSIS — R109 Unspecified abdominal pain: Secondary | ICD-10-CM | POA: Diagnosis not present

## 2021-10-09 DIAGNOSIS — K5900 Constipation, unspecified: Secondary | ICD-10-CM | POA: Diagnosis not present

## 2021-10-09 DIAGNOSIS — N132 Hydronephrosis with renal and ureteral calculous obstruction: Secondary | ICD-10-CM | POA: Diagnosis not present

## 2021-10-16 DIAGNOSIS — Z79620 Long term (current) use of immunosuppressive biologic: Secondary | ICD-10-CM | POA: Diagnosis not present

## 2021-10-16 DIAGNOSIS — K208 Other esophagitis without bleeding: Secondary | ICD-10-CM | POA: Diagnosis not present

## 2021-10-16 DIAGNOSIS — Z9884 Bariatric surgery status: Secondary | ICD-10-CM | POA: Diagnosis not present

## 2021-10-16 DIAGNOSIS — M159 Polyosteoarthritis, unspecified: Secondary | ICD-10-CM | POA: Diagnosis not present

## 2021-10-16 DIAGNOSIS — Z882 Allergy status to sulfonamides status: Secondary | ICD-10-CM | POA: Diagnosis not present

## 2021-10-16 DIAGNOSIS — Z791 Long term (current) use of non-steroidal anti-inflammatories (NSAID): Secondary | ICD-10-CM | POA: Diagnosis not present

## 2021-10-16 DIAGNOSIS — K2100 Gastro-esophageal reflux disease with esophagitis, without bleeding: Secondary | ICD-10-CM | POA: Diagnosis not present

## 2021-10-16 DIAGNOSIS — Z881 Allergy status to other antibiotic agents status: Secondary | ICD-10-CM | POA: Diagnosis not present

## 2021-10-16 DIAGNOSIS — Z79899 Other long term (current) drug therapy: Secondary | ICD-10-CM | POA: Diagnosis not present

## 2021-10-16 DIAGNOSIS — K2270 Barrett's esophagus without dysplasia: Secondary | ICD-10-CM | POA: Diagnosis not present

## 2021-10-16 DIAGNOSIS — Z888 Allergy status to other drugs, medicaments and biological substances status: Secondary | ICD-10-CM | POA: Diagnosis not present

## 2021-10-16 DIAGNOSIS — B3781 Candidal esophagitis: Secondary | ICD-10-CM | POA: Diagnosis not present

## 2021-10-16 DIAGNOSIS — E119 Type 2 diabetes mellitus without complications: Secondary | ICD-10-CM | POA: Diagnosis not present

## 2021-10-16 DIAGNOSIS — R1110 Vomiting, unspecified: Secondary | ICD-10-CM | POA: Diagnosis not present

## 2021-10-16 DIAGNOSIS — Z885 Allergy status to narcotic agent status: Secondary | ICD-10-CM | POA: Diagnosis not present

## 2021-10-16 DIAGNOSIS — M069 Rheumatoid arthritis, unspecified: Secondary | ICD-10-CM | POA: Diagnosis not present

## 2021-10-16 DIAGNOSIS — K2289 Other specified disease of esophagus: Secondary | ICD-10-CM | POA: Diagnosis not present

## 2021-10-16 DIAGNOSIS — K229 Disease of esophagus, unspecified: Secondary | ICD-10-CM | POA: Diagnosis not present

## 2021-10-16 DIAGNOSIS — I10 Essential (primary) hypertension: Secondary | ICD-10-CM | POA: Diagnosis not present

## 2021-10-16 DIAGNOSIS — K224 Dyskinesia of esophagus: Secondary | ICD-10-CM | POA: Diagnosis not present

## 2021-10-16 DIAGNOSIS — J45909 Unspecified asthma, uncomplicated: Secondary | ICD-10-CM | POA: Diagnosis not present

## 2021-10-23 DIAGNOSIS — E119 Type 2 diabetes mellitus without complications: Secondary | ICD-10-CM | POA: Diagnosis not present

## 2021-10-23 DIAGNOSIS — K219 Gastro-esophageal reflux disease without esophagitis: Secondary | ICD-10-CM | POA: Diagnosis not present

## 2021-10-23 DIAGNOSIS — E539 Vitamin B deficiency, unspecified: Secondary | ICD-10-CM | POA: Diagnosis not present

## 2021-10-23 DIAGNOSIS — E559 Vitamin D deficiency, unspecified: Secondary | ICD-10-CM | POA: Diagnosis not present

## 2021-10-23 DIAGNOSIS — N3281 Overactive bladder: Secondary | ICD-10-CM | POA: Diagnosis not present

## 2021-10-23 DIAGNOSIS — N183 Chronic kidney disease, stage 3 unspecified: Secondary | ICD-10-CM | POA: Diagnosis not present

## 2021-10-27 DIAGNOSIS — R531 Weakness: Secondary | ICD-10-CM | POA: Diagnosis not present

## 2021-10-27 DIAGNOSIS — N390 Urinary tract infection, site not specified: Secondary | ICD-10-CM | POA: Diagnosis not present

## 2021-10-27 DIAGNOSIS — J449 Chronic obstructive pulmonary disease, unspecified: Secondary | ICD-10-CM | POA: Diagnosis not present

## 2021-10-27 DIAGNOSIS — E860 Dehydration: Secondary | ICD-10-CM | POA: Diagnosis not present

## 2021-10-27 DIAGNOSIS — M069 Rheumatoid arthritis, unspecified: Secondary | ICD-10-CM | POA: Diagnosis not present

## 2021-10-30 DIAGNOSIS — Z87442 Personal history of urinary calculi: Secondary | ICD-10-CM | POA: Diagnosis not present

## 2021-10-30 DIAGNOSIS — Z888 Allergy status to other drugs, medicaments and biological substances status: Secondary | ICD-10-CM | POA: Diagnosis not present

## 2021-10-30 DIAGNOSIS — K219 Gastro-esophageal reflux disease without esophagitis: Secondary | ICD-10-CM | POA: Diagnosis not present

## 2021-10-30 DIAGNOSIS — Z880 Allergy status to penicillin: Secondary | ICD-10-CM | POA: Diagnosis not present

## 2021-10-30 DIAGNOSIS — E119 Type 2 diabetes mellitus without complications: Secondary | ICD-10-CM | POA: Diagnosis not present

## 2021-10-30 DIAGNOSIS — Z881 Allergy status to other antibiotic agents status: Secondary | ICD-10-CM | POA: Diagnosis not present

## 2021-10-30 DIAGNOSIS — J45909 Unspecified asthma, uncomplicated: Secondary | ICD-10-CM | POA: Diagnosis not present

## 2021-10-30 DIAGNOSIS — E049 Nontoxic goiter, unspecified: Secondary | ICD-10-CM | POA: Diagnosis not present

## 2021-10-30 DIAGNOSIS — M1812 Unilateral primary osteoarthritis of first carpometacarpal joint, left hand: Secondary | ICD-10-CM | POA: Diagnosis not present

## 2021-10-30 DIAGNOSIS — Z6841 Body Mass Index (BMI) 40.0 and over, adult: Secondary | ICD-10-CM | POA: Diagnosis not present

## 2021-10-30 DIAGNOSIS — G4730 Sleep apnea, unspecified: Secondary | ICD-10-CM | POA: Diagnosis not present

## 2021-10-30 DIAGNOSIS — Z886 Allergy status to analgesic agent status: Secondary | ICD-10-CM | POA: Diagnosis not present

## 2021-10-30 DIAGNOSIS — N202 Calculus of kidney with calculus of ureter: Secondary | ICD-10-CM | POA: Diagnosis not present

## 2021-10-30 DIAGNOSIS — Z9884 Bariatric surgery status: Secondary | ICD-10-CM | POA: Diagnosis not present

## 2021-10-30 DIAGNOSIS — I10 Essential (primary) hypertension: Secondary | ICD-10-CM | POA: Diagnosis not present

## 2021-10-30 DIAGNOSIS — M0600 Rheumatoid arthritis without rheumatoid factor, unspecified site: Secondary | ICD-10-CM | POA: Diagnosis not present

## 2021-11-02 DIAGNOSIS — M62838 Other muscle spasm: Secondary | ICD-10-CM | POA: Diagnosis not present

## 2021-11-04 DIAGNOSIS — M9905 Segmental and somatic dysfunction of pelvic region: Secondary | ICD-10-CM | POA: Diagnosis not present

## 2021-11-04 DIAGNOSIS — M9901 Segmental and somatic dysfunction of cervical region: Secondary | ICD-10-CM | POA: Diagnosis not present

## 2021-11-04 DIAGNOSIS — M461 Sacroiliitis, not elsewhere classified: Secondary | ICD-10-CM | POA: Diagnosis not present

## 2021-11-04 DIAGNOSIS — M7912 Myalgia of auxiliary muscles, head and neck: Secondary | ICD-10-CM | POA: Diagnosis not present

## 2021-11-09 DIAGNOSIS — M7912 Myalgia of auxiliary muscles, head and neck: Secondary | ICD-10-CM | POA: Diagnosis not present

## 2021-11-09 DIAGNOSIS — M9901 Segmental and somatic dysfunction of cervical region: Secondary | ICD-10-CM | POA: Diagnosis not present

## 2021-11-09 DIAGNOSIS — M461 Sacroiliitis, not elsewhere classified: Secondary | ICD-10-CM | POA: Diagnosis not present

## 2021-11-09 DIAGNOSIS — M9905 Segmental and somatic dysfunction of pelvic region: Secondary | ICD-10-CM | POA: Diagnosis not present

## 2021-11-13 DIAGNOSIS — M461 Sacroiliitis, not elsewhere classified: Secondary | ICD-10-CM | POA: Diagnosis not present

## 2021-11-13 DIAGNOSIS — M9905 Segmental and somatic dysfunction of pelvic region: Secondary | ICD-10-CM | POA: Diagnosis not present

## 2021-11-13 DIAGNOSIS — M7912 Myalgia of auxiliary muscles, head and neck: Secondary | ICD-10-CM | POA: Diagnosis not present

## 2021-11-13 DIAGNOSIS — M9901 Segmental and somatic dysfunction of cervical region: Secondary | ICD-10-CM | POA: Diagnosis not present

## 2021-11-16 DIAGNOSIS — G8929 Other chronic pain: Secondary | ICD-10-CM | POA: Diagnosis not present

## 2021-11-16 DIAGNOSIS — M0609 Rheumatoid arthritis without rheumatoid factor, multiple sites: Secondary | ICD-10-CM | POA: Diagnosis not present

## 2021-11-16 DIAGNOSIS — M25562 Pain in left knee: Secondary | ICD-10-CM | POA: Diagnosis not present

## 2021-11-16 DIAGNOSIS — M9901 Segmental and somatic dysfunction of cervical region: Secondary | ICD-10-CM | POA: Diagnosis not present

## 2021-11-16 DIAGNOSIS — M461 Sacroiliitis, not elsewhere classified: Secondary | ICD-10-CM | POA: Diagnosis not present

## 2021-11-16 DIAGNOSIS — M9905 Segmental and somatic dysfunction of pelvic region: Secondary | ICD-10-CM | POA: Diagnosis not present

## 2021-11-16 DIAGNOSIS — M7912 Myalgia of auxiliary muscles, head and neck: Secondary | ICD-10-CM | POA: Diagnosis not present

## 2021-11-16 DIAGNOSIS — Z111 Encounter for screening for respiratory tuberculosis: Secondary | ICD-10-CM | POA: Diagnosis not present

## 2021-11-16 DIAGNOSIS — M1712 Unilateral primary osteoarthritis, left knee: Secondary | ICD-10-CM | POA: Diagnosis not present

## 2021-11-16 HISTORY — DX: Rheumatoid arthritis without rheumatoid factor, multiple sites: M06.09

## 2021-11-20 DIAGNOSIS — M7912 Myalgia of auxiliary muscles, head and neck: Secondary | ICD-10-CM | POA: Diagnosis not present

## 2021-11-20 DIAGNOSIS — Z79899 Other long term (current) drug therapy: Secondary | ICD-10-CM | POA: Insufficient documentation

## 2021-11-20 DIAGNOSIS — Z5181 Encounter for therapeutic drug level monitoring: Secondary | ICD-10-CM

## 2021-11-20 DIAGNOSIS — M9905 Segmental and somatic dysfunction of pelvic region: Secondary | ICD-10-CM | POA: Diagnosis not present

## 2021-11-20 DIAGNOSIS — M9901 Segmental and somatic dysfunction of cervical region: Secondary | ICD-10-CM | POA: Diagnosis not present

## 2021-11-20 DIAGNOSIS — M461 Sacroiliitis, not elsewhere classified: Secondary | ICD-10-CM | POA: Diagnosis not present

## 2021-11-20 HISTORY — DX: Other long term (current) drug therapy: Z79.899

## 2021-11-20 HISTORY — DX: Encounter for therapeutic drug level monitoring: Z51.81

## 2021-11-24 DIAGNOSIS — K219 Gastro-esophageal reflux disease without esophagitis: Secondary | ICD-10-CM | POA: Diagnosis not present

## 2021-11-24 DIAGNOSIS — M461 Sacroiliitis, not elsewhere classified: Secondary | ICD-10-CM | POA: Diagnosis not present

## 2021-11-24 DIAGNOSIS — Z6841 Body Mass Index (BMI) 40.0 and over, adult: Secondary | ICD-10-CM | POA: Diagnosis not present

## 2021-11-24 DIAGNOSIS — N201 Calculus of ureter: Secondary | ICD-10-CM | POA: Diagnosis not present

## 2021-11-24 DIAGNOSIS — E119 Type 2 diabetes mellitus without complications: Secondary | ICD-10-CM | POA: Diagnosis not present

## 2021-11-24 DIAGNOSIS — Z87442 Personal history of urinary calculi: Secondary | ICD-10-CM | POA: Diagnosis not present

## 2021-11-24 DIAGNOSIS — N1330 Unspecified hydronephrosis: Secondary | ICD-10-CM | POA: Diagnosis not present

## 2021-11-24 DIAGNOSIS — M9905 Segmental and somatic dysfunction of pelvic region: Secondary | ICD-10-CM | POA: Diagnosis not present

## 2021-11-24 DIAGNOSIS — R1110 Vomiting, unspecified: Secondary | ICD-10-CM | POA: Diagnosis not present

## 2021-11-24 DIAGNOSIS — M9901 Segmental and somatic dysfunction of cervical region: Secondary | ICD-10-CM | POA: Diagnosis not present

## 2021-11-24 DIAGNOSIS — R109 Unspecified abdominal pain: Secondary | ICD-10-CM | POA: Diagnosis not present

## 2021-11-24 DIAGNOSIS — M7912 Myalgia of auxiliary muscles, head and neck: Secondary | ICD-10-CM | POA: Diagnosis not present

## 2021-11-24 DIAGNOSIS — K920 Hematemesis: Secondary | ICD-10-CM | POA: Diagnosis not present

## 2021-11-24 DIAGNOSIS — J449 Chronic obstructive pulmonary disease, unspecified: Secondary | ICD-10-CM | POA: Diagnosis not present

## 2021-11-25 DIAGNOSIS — R051 Acute cough: Secondary | ICD-10-CM | POA: Diagnosis not present

## 2021-11-25 DIAGNOSIS — K2960 Other gastritis without bleeding: Secondary | ICD-10-CM | POA: Diagnosis not present

## 2021-11-25 DIAGNOSIS — E669 Obesity, unspecified: Secondary | ICD-10-CM | POA: Diagnosis not present

## 2021-11-25 DIAGNOSIS — J029 Acute pharyngitis, unspecified: Secondary | ICD-10-CM | POA: Diagnosis not present

## 2021-11-25 DIAGNOSIS — R042 Hemoptysis: Secondary | ICD-10-CM | POA: Diagnosis not present

## 2021-11-25 DIAGNOSIS — R070 Pain in throat: Secondary | ICD-10-CM | POA: Diagnosis not present

## 2021-11-25 DIAGNOSIS — B349 Viral infection, unspecified: Secondary | ICD-10-CM | POA: Diagnosis not present

## 2021-11-25 DIAGNOSIS — R918 Other nonspecific abnormal finding of lung field: Secondary | ICD-10-CM | POA: Diagnosis not present

## 2021-11-25 DIAGNOSIS — K2950 Unspecified chronic gastritis without bleeding: Secondary | ICD-10-CM | POA: Diagnosis not present

## 2021-11-25 DIAGNOSIS — K219 Gastro-esophageal reflux disease without esophagitis: Secondary | ICD-10-CM | POA: Diagnosis not present

## 2021-11-25 DIAGNOSIS — R112 Nausea with vomiting, unspecified: Secondary | ICD-10-CM | POA: Diagnosis not present

## 2021-11-25 DIAGNOSIS — Z9884 Bariatric surgery status: Secondary | ICD-10-CM | POA: Diagnosis not present

## 2021-11-25 DIAGNOSIS — Z20822 Contact with and (suspected) exposure to covid-19: Secondary | ICD-10-CM | POA: Diagnosis not present

## 2021-11-25 DIAGNOSIS — R197 Diarrhea, unspecified: Secondary | ICD-10-CM | POA: Diagnosis not present

## 2021-11-27 DIAGNOSIS — L2089 Other atopic dermatitis: Secondary | ICD-10-CM | POA: Diagnosis not present

## 2021-11-30 DIAGNOSIS — M9905 Segmental and somatic dysfunction of pelvic region: Secondary | ICD-10-CM | POA: Diagnosis not present

## 2021-11-30 DIAGNOSIS — M461 Sacroiliitis, not elsewhere classified: Secondary | ICD-10-CM | POA: Diagnosis not present

## 2021-11-30 DIAGNOSIS — M9901 Segmental and somatic dysfunction of cervical region: Secondary | ICD-10-CM | POA: Diagnosis not present

## 2021-11-30 DIAGNOSIS — M7912 Myalgia of auxiliary muscles, head and neck: Secondary | ICD-10-CM | POA: Diagnosis not present

## 2021-12-01 DIAGNOSIS — J019 Acute sinusitis, unspecified: Secondary | ICD-10-CM | POA: Diagnosis not present

## 2021-12-01 DIAGNOSIS — J209 Acute bronchitis, unspecified: Secondary | ICD-10-CM | POA: Diagnosis not present

## 2021-12-03 DIAGNOSIS — J209 Acute bronchitis, unspecified: Secondary | ICD-10-CM | POA: Diagnosis not present

## 2021-12-03 DIAGNOSIS — R051 Acute cough: Secondary | ICD-10-CM | POA: Diagnosis not present

## 2021-12-11 DIAGNOSIS — Z6841 Body Mass Index (BMI) 40.0 and over, adult: Secondary | ICD-10-CM | POA: Diagnosis not present

## 2021-12-11 DIAGNOSIS — M069 Rheumatoid arthritis, unspecified: Secondary | ICD-10-CM | POA: Diagnosis not present

## 2021-12-13 DIAGNOSIS — M08012 Unspecified juvenile rheumatoid arthritis, left shoulder: Secondary | ICD-10-CM | POA: Diagnosis not present

## 2021-12-13 DIAGNOSIS — M2550 Pain in unspecified joint: Secondary | ICD-10-CM | POA: Diagnosis not present

## 2021-12-13 DIAGNOSIS — M069 Rheumatoid arthritis, unspecified: Secondary | ICD-10-CM | POA: Diagnosis not present

## 2021-12-13 DIAGNOSIS — M25511 Pain in right shoulder: Secondary | ICD-10-CM | POA: Diagnosis not present

## 2021-12-13 DIAGNOSIS — M08011 Unspecified juvenile rheumatoid arthritis, right shoulder: Secondary | ICD-10-CM | POA: Diagnosis not present

## 2021-12-14 DIAGNOSIS — M7912 Myalgia of auxiliary muscles, head and neck: Secondary | ICD-10-CM | POA: Diagnosis not present

## 2021-12-14 DIAGNOSIS — M9901 Segmental and somatic dysfunction of cervical region: Secondary | ICD-10-CM | POA: Diagnosis not present

## 2021-12-14 DIAGNOSIS — M9905 Segmental and somatic dysfunction of pelvic region: Secondary | ICD-10-CM | POA: Diagnosis not present

## 2021-12-14 DIAGNOSIS — M461 Sacroiliitis, not elsewhere classified: Secondary | ICD-10-CM | POA: Diagnosis not present

## 2021-12-15 DIAGNOSIS — H40013 Open angle with borderline findings, low risk, bilateral: Secondary | ICD-10-CM | POA: Diagnosis not present

## 2021-12-15 DIAGNOSIS — Z947 Corneal transplant status: Secondary | ICD-10-CM | POA: Diagnosis not present

## 2021-12-15 DIAGNOSIS — M1812 Unilateral primary osteoarthritis of first carpometacarpal joint, left hand: Secondary | ICD-10-CM | POA: Diagnosis not present

## 2021-12-15 DIAGNOSIS — E119 Type 2 diabetes mellitus without complications: Secondary | ICD-10-CM | POA: Diagnosis not present

## 2021-12-15 DIAGNOSIS — H18613 Keratoconus, stable, bilateral: Secondary | ICD-10-CM | POA: Diagnosis not present

## 2021-12-18 DIAGNOSIS — M25562 Pain in left knee: Secondary | ICD-10-CM | POA: Diagnosis not present

## 2021-12-18 DIAGNOSIS — M25511 Pain in right shoulder: Secondary | ICD-10-CM | POA: Diagnosis not present

## 2021-12-18 DIAGNOSIS — M069 Rheumatoid arthritis, unspecified: Secondary | ICD-10-CM | POA: Diagnosis not present

## 2021-12-18 DIAGNOSIS — M25512 Pain in left shoulder: Secondary | ICD-10-CM | POA: Diagnosis not present

## 2021-12-18 DIAGNOSIS — M25561 Pain in right knee: Secondary | ICD-10-CM | POA: Diagnosis not present

## 2021-12-18 DIAGNOSIS — M2550 Pain in unspecified joint: Secondary | ICD-10-CM | POA: Diagnosis not present

## 2021-12-19 DIAGNOSIS — M2550 Pain in unspecified joint: Secondary | ICD-10-CM | POA: Diagnosis not present

## 2021-12-19 DIAGNOSIS — M08012 Unspecified juvenile rheumatoid arthritis, left shoulder: Secondary | ICD-10-CM | POA: Diagnosis not present

## 2021-12-19 DIAGNOSIS — M19012 Primary osteoarthritis, left shoulder: Secondary | ICD-10-CM | POA: Diagnosis not present

## 2021-12-19 DIAGNOSIS — M25512 Pain in left shoulder: Secondary | ICD-10-CM | POA: Diagnosis not present

## 2021-12-19 DIAGNOSIS — M069 Rheumatoid arthritis, unspecified: Secondary | ICD-10-CM | POA: Diagnosis not present

## 2021-12-21 DIAGNOSIS — M461 Sacroiliitis, not elsewhere classified: Secondary | ICD-10-CM | POA: Diagnosis not present

## 2021-12-21 DIAGNOSIS — M9905 Segmental and somatic dysfunction of pelvic region: Secondary | ICD-10-CM | POA: Diagnosis not present

## 2021-12-21 DIAGNOSIS — M9901 Segmental and somatic dysfunction of cervical region: Secondary | ICD-10-CM | POA: Diagnosis not present

## 2021-12-21 DIAGNOSIS — M7912 Myalgia of auxiliary muscles, head and neck: Secondary | ICD-10-CM | POA: Diagnosis not present

## 2021-12-24 DIAGNOSIS — M9901 Segmental and somatic dysfunction of cervical region: Secondary | ICD-10-CM | POA: Diagnosis not present

## 2021-12-24 DIAGNOSIS — M7912 Myalgia of auxiliary muscles, head and neck: Secondary | ICD-10-CM | POA: Diagnosis not present

## 2021-12-24 DIAGNOSIS — M461 Sacroiliitis, not elsewhere classified: Secondary | ICD-10-CM | POA: Diagnosis not present

## 2021-12-24 DIAGNOSIS — M9905 Segmental and somatic dysfunction of pelvic region: Secondary | ICD-10-CM | POA: Diagnosis not present

## 2021-12-25 DIAGNOSIS — M1812 Unilateral primary osteoarthritis of first carpometacarpal joint, left hand: Secondary | ICD-10-CM | POA: Diagnosis not present

## 2021-12-28 DIAGNOSIS — M461 Sacroiliitis, not elsewhere classified: Secondary | ICD-10-CM | POA: Diagnosis not present

## 2021-12-28 DIAGNOSIS — M9905 Segmental and somatic dysfunction of pelvic region: Secondary | ICD-10-CM | POA: Diagnosis not present

## 2021-12-28 DIAGNOSIS — R1319 Other dysphagia: Secondary | ICD-10-CM | POA: Diagnosis not present

## 2021-12-28 DIAGNOSIS — M9901 Segmental and somatic dysfunction of cervical region: Secondary | ICD-10-CM | POA: Diagnosis not present

## 2021-12-28 DIAGNOSIS — M7912 Myalgia of auxiliary muscles, head and neck: Secondary | ICD-10-CM | POA: Diagnosis not present

## 2021-12-29 DIAGNOSIS — M0609 Rheumatoid arthritis without rheumatoid factor, multiple sites: Secondary | ICD-10-CM | POA: Diagnosis not present

## 2021-12-29 DIAGNOSIS — Z5181 Encounter for therapeutic drug level monitoring: Secondary | ICD-10-CM | POA: Diagnosis not present

## 2021-12-29 DIAGNOSIS — G5601 Carpal tunnel syndrome, right upper limb: Secondary | ICD-10-CM | POA: Diagnosis not present

## 2021-12-29 DIAGNOSIS — Z79899 Other long term (current) drug therapy: Secondary | ICD-10-CM | POA: Diagnosis not present

## 2022-01-01 DIAGNOSIS — M1812 Unilateral primary osteoarthritis of first carpometacarpal joint, left hand: Secondary | ICD-10-CM | POA: Diagnosis not present

## 2022-01-05 DIAGNOSIS — M7912 Myalgia of auxiliary muscles, head and neck: Secondary | ICD-10-CM | POA: Diagnosis not present

## 2022-01-05 DIAGNOSIS — M9905 Segmental and somatic dysfunction of pelvic region: Secondary | ICD-10-CM | POA: Diagnosis not present

## 2022-01-05 DIAGNOSIS — M461 Sacroiliitis, not elsewhere classified: Secondary | ICD-10-CM | POA: Diagnosis not present

## 2022-01-05 DIAGNOSIS — M9901 Segmental and somatic dysfunction of cervical region: Secondary | ICD-10-CM | POA: Diagnosis not present

## 2022-01-08 DIAGNOSIS — M9905 Segmental and somatic dysfunction of pelvic region: Secondary | ICD-10-CM | POA: Diagnosis not present

## 2022-01-08 DIAGNOSIS — M7912 Myalgia of auxiliary muscles, head and neck: Secondary | ICD-10-CM | POA: Diagnosis not present

## 2022-01-08 DIAGNOSIS — G5601 Carpal tunnel syndrome, right upper limb: Secondary | ICD-10-CM | POA: Diagnosis not present

## 2022-01-08 DIAGNOSIS — M9901 Segmental and somatic dysfunction of cervical region: Secondary | ICD-10-CM | POA: Diagnosis not present

## 2022-01-08 DIAGNOSIS — M461 Sacroiliitis, not elsewhere classified: Secondary | ICD-10-CM | POA: Diagnosis not present

## 2022-01-11 DIAGNOSIS — M7912 Myalgia of auxiliary muscles, head and neck: Secondary | ICD-10-CM | POA: Diagnosis not present

## 2022-01-11 DIAGNOSIS — M9901 Segmental and somatic dysfunction of cervical region: Secondary | ICD-10-CM | POA: Diagnosis not present

## 2022-01-11 DIAGNOSIS — M9905 Segmental and somatic dysfunction of pelvic region: Secondary | ICD-10-CM | POA: Diagnosis not present

## 2022-01-11 DIAGNOSIS — M461 Sacroiliitis, not elsewhere classified: Secondary | ICD-10-CM | POA: Diagnosis not present

## 2022-01-15 DIAGNOSIS — M7912 Myalgia of auxiliary muscles, head and neck: Secondary | ICD-10-CM | POA: Diagnosis not present

## 2022-01-15 DIAGNOSIS — M9901 Segmental and somatic dysfunction of cervical region: Secondary | ICD-10-CM | POA: Diagnosis not present

## 2022-01-15 DIAGNOSIS — M9905 Segmental and somatic dysfunction of pelvic region: Secondary | ICD-10-CM | POA: Diagnosis not present

## 2022-01-15 DIAGNOSIS — M461 Sacroiliitis, not elsewhere classified: Secondary | ICD-10-CM | POA: Diagnosis not present

## 2022-01-18 DIAGNOSIS — R109 Unspecified abdominal pain: Secondary | ICD-10-CM | POA: Diagnosis not present

## 2022-01-18 DIAGNOSIS — Z885 Allergy status to narcotic agent status: Secondary | ICD-10-CM | POA: Diagnosis not present

## 2022-01-18 DIAGNOSIS — M7912 Myalgia of auxiliary muscles, head and neck: Secondary | ICD-10-CM | POA: Diagnosis not present

## 2022-01-18 DIAGNOSIS — M9905 Segmental and somatic dysfunction of pelvic region: Secondary | ICD-10-CM | POA: Diagnosis not present

## 2022-01-18 DIAGNOSIS — M0609 Rheumatoid arthritis without rheumatoid factor, multiple sites: Secondary | ICD-10-CM | POA: Diagnosis not present

## 2022-01-18 DIAGNOSIS — Z880 Allergy status to penicillin: Secondary | ICD-10-CM | POA: Diagnosis not present

## 2022-01-18 DIAGNOSIS — N200 Calculus of kidney: Secondary | ICD-10-CM | POA: Diagnosis not present

## 2022-01-18 DIAGNOSIS — M461 Sacroiliitis, not elsewhere classified: Secondary | ICD-10-CM | POA: Diagnosis not present

## 2022-01-18 DIAGNOSIS — Z882 Allergy status to sulfonamides status: Secondary | ICD-10-CM | POA: Diagnosis not present

## 2022-01-18 DIAGNOSIS — Z881 Allergy status to other antibiotic agents status: Secondary | ICD-10-CM | POA: Diagnosis not present

## 2022-01-18 DIAGNOSIS — Z79899 Other long term (current) drug therapy: Secondary | ICD-10-CM | POA: Diagnosis not present

## 2022-01-18 DIAGNOSIS — Z79620 Long term (current) use of immunosuppressive biologic: Secondary | ICD-10-CM | POA: Diagnosis not present

## 2022-01-18 DIAGNOSIS — M9901 Segmental and somatic dysfunction of cervical region: Secondary | ICD-10-CM | POA: Diagnosis not present

## 2022-01-18 DIAGNOSIS — Z886 Allergy status to analgesic agent status: Secondary | ICD-10-CM | POA: Diagnosis not present

## 2022-01-18 DIAGNOSIS — N132 Hydronephrosis with renal and ureteral calculous obstruction: Secondary | ICD-10-CM | POA: Diagnosis not present

## 2022-01-18 DIAGNOSIS — Z9884 Bariatric surgery status: Secondary | ICD-10-CM | POA: Diagnosis not present

## 2022-01-18 DIAGNOSIS — Z6841 Body Mass Index (BMI) 40.0 and over, adult: Secondary | ICD-10-CM | POA: Diagnosis not present

## 2022-01-18 DIAGNOSIS — N201 Calculus of ureter: Secondary | ICD-10-CM | POA: Diagnosis not present

## 2022-01-18 DIAGNOSIS — N1330 Unspecified hydronephrosis: Secondary | ICD-10-CM | POA: Diagnosis not present

## 2022-01-18 DIAGNOSIS — Z888 Allergy status to other drugs, medicaments and biological substances status: Secondary | ICD-10-CM | POA: Diagnosis not present

## 2022-01-18 DIAGNOSIS — R000 Tachycardia, unspecified: Secondary | ICD-10-CM | POA: Diagnosis not present

## 2022-01-19 DIAGNOSIS — N132 Hydronephrosis with renal and ureteral calculous obstruction: Secondary | ICD-10-CM | POA: Diagnosis not present

## 2022-01-20 DIAGNOSIS — N201 Calculus of ureter: Secondary | ICD-10-CM | POA: Diagnosis not present

## 2022-01-20 DIAGNOSIS — N200 Calculus of kidney: Secondary | ICD-10-CM | POA: Diagnosis not present

## 2022-01-20 DIAGNOSIS — N135 Crossing vessel and stricture of ureter without hydronephrosis: Secondary | ICD-10-CM | POA: Diagnosis not present

## 2022-01-20 HISTORY — DX: Calculus of ureter: N20.1

## 2022-01-21 DIAGNOSIS — R109 Unspecified abdominal pain: Secondary | ICD-10-CM | POA: Diagnosis not present

## 2022-01-26 DIAGNOSIS — N1339 Other hydronephrosis: Secondary | ICD-10-CM | POA: Diagnosis not present

## 2022-01-26 DIAGNOSIS — R102 Pelvic and perineal pain: Secondary | ICD-10-CM | POA: Diagnosis not present

## 2022-01-26 DIAGNOSIS — Z79899 Other long term (current) drug therapy: Secondary | ICD-10-CM | POA: Diagnosis not present

## 2022-01-26 DIAGNOSIS — R300 Dysuria: Secondary | ICD-10-CM | POA: Diagnosis not present

## 2022-01-26 DIAGNOSIS — Z960 Presence of urogenital implants: Secondary | ICD-10-CM | POA: Diagnosis not present

## 2022-01-26 DIAGNOSIS — R7989 Other specified abnormal findings of blood chemistry: Secondary | ICD-10-CM | POA: Diagnosis not present

## 2022-01-26 DIAGNOSIS — R1030 Lower abdominal pain, unspecified: Secondary | ICD-10-CM | POA: Diagnosis not present

## 2022-01-26 DIAGNOSIS — R350 Frequency of micturition: Secondary | ICD-10-CM | POA: Diagnosis not present

## 2022-01-26 DIAGNOSIS — N1330 Unspecified hydronephrosis: Secondary | ICD-10-CM | POA: Diagnosis not present

## 2022-01-26 DIAGNOSIS — R82998 Other abnormal findings in urine: Secondary | ICD-10-CM | POA: Diagnosis not present

## 2022-01-26 DIAGNOSIS — Z87442 Personal history of urinary calculi: Secondary | ICD-10-CM | POA: Diagnosis not present

## 2022-01-28 DIAGNOSIS — Z09 Encounter for follow-up examination after completed treatment for conditions other than malignant neoplasm: Secondary | ICD-10-CM | POA: Diagnosis not present

## 2022-01-28 DIAGNOSIS — Z87442 Personal history of urinary calculi: Secondary | ICD-10-CM | POA: Diagnosis not present

## 2022-01-28 DIAGNOSIS — N200 Calculus of kidney: Secondary | ICD-10-CM | POA: Diagnosis not present

## 2022-02-01 DIAGNOSIS — M461 Sacroiliitis, not elsewhere classified: Secondary | ICD-10-CM | POA: Diagnosis not present

## 2022-02-01 DIAGNOSIS — M9901 Segmental and somatic dysfunction of cervical region: Secondary | ICD-10-CM | POA: Diagnosis not present

## 2022-02-01 DIAGNOSIS — M9905 Segmental and somatic dysfunction of pelvic region: Secondary | ICD-10-CM | POA: Diagnosis not present

## 2022-02-01 DIAGNOSIS — M7912 Myalgia of auxiliary muscles, head and neck: Secondary | ICD-10-CM | POA: Diagnosis not present

## 2022-02-05 DIAGNOSIS — R350 Frequency of micturition: Secondary | ICD-10-CM | POA: Diagnosis not present

## 2022-02-08 DIAGNOSIS — K1230 Oral mucositis (ulcerative), unspecified: Secondary | ICD-10-CM | POA: Diagnosis not present

## 2022-02-22 DIAGNOSIS — M9901 Segmental and somatic dysfunction of cervical region: Secondary | ICD-10-CM | POA: Diagnosis not present

## 2022-02-22 DIAGNOSIS — M461 Sacroiliitis, not elsewhere classified: Secondary | ICD-10-CM | POA: Diagnosis not present

## 2022-02-22 DIAGNOSIS — M7912 Myalgia of auxiliary muscles, head and neck: Secondary | ICD-10-CM | POA: Diagnosis not present

## 2022-02-22 DIAGNOSIS — M9905 Segmental and somatic dysfunction of pelvic region: Secondary | ICD-10-CM | POA: Diagnosis not present

## 2022-02-23 DIAGNOSIS — E119 Type 2 diabetes mellitus without complications: Secondary | ICD-10-CM | POA: Diagnosis not present

## 2022-02-23 DIAGNOSIS — H18613 Keratoconus, stable, bilateral: Secondary | ICD-10-CM | POA: Diagnosis not present

## 2022-02-23 DIAGNOSIS — Z947 Corneal transplant status: Secondary | ICD-10-CM | POA: Diagnosis not present

## 2022-02-23 DIAGNOSIS — H40013 Open angle with borderline findings, low risk, bilateral: Secondary | ICD-10-CM | POA: Diagnosis not present

## 2022-02-23 DIAGNOSIS — M0609 Rheumatoid arthritis without rheumatoid factor, multiple sites: Secondary | ICD-10-CM | POA: Diagnosis not present

## 2022-03-01 DIAGNOSIS — M0609 Rheumatoid arthritis without rheumatoid factor, multiple sites: Secondary | ICD-10-CM | POA: Diagnosis not present

## 2022-03-01 DIAGNOSIS — Z5181 Encounter for therapeutic drug level monitoring: Secondary | ICD-10-CM | POA: Diagnosis not present

## 2022-03-01 DIAGNOSIS — M1712 Unilateral primary osteoarthritis, left knee: Secondary | ICD-10-CM | POA: Diagnosis not present

## 2022-03-01 DIAGNOSIS — Z79899 Other long term (current) drug therapy: Secondary | ICD-10-CM | POA: Diagnosis not present

## 2022-03-09 DIAGNOSIS — M9905 Segmental and somatic dysfunction of pelvic region: Secondary | ICD-10-CM | POA: Diagnosis not present

## 2022-03-09 DIAGNOSIS — M461 Sacroiliitis, not elsewhere classified: Secondary | ICD-10-CM | POA: Diagnosis not present

## 2022-03-09 DIAGNOSIS — M7912 Myalgia of auxiliary muscles, head and neck: Secondary | ICD-10-CM | POA: Diagnosis not present

## 2022-03-09 DIAGNOSIS — M9901 Segmental and somatic dysfunction of cervical region: Secondary | ICD-10-CM | POA: Diagnosis not present

## 2022-03-15 DIAGNOSIS — N200 Calculus of kidney: Secondary | ICD-10-CM | POA: Diagnosis not present

## 2022-03-16 DIAGNOSIS — M9905 Segmental and somatic dysfunction of pelvic region: Secondary | ICD-10-CM | POA: Diagnosis not present

## 2022-03-16 DIAGNOSIS — M461 Sacroiliitis, not elsewhere classified: Secondary | ICD-10-CM | POA: Diagnosis not present

## 2022-03-16 DIAGNOSIS — M7912 Myalgia of auxiliary muscles, head and neck: Secondary | ICD-10-CM | POA: Diagnosis not present

## 2022-03-16 DIAGNOSIS — G5602 Carpal tunnel syndrome, left upper limb: Secondary | ICD-10-CM | POA: Diagnosis not present

## 2022-03-16 DIAGNOSIS — M9901 Segmental and somatic dysfunction of cervical region: Secondary | ICD-10-CM | POA: Diagnosis not present

## 2022-03-16 DIAGNOSIS — Z01419 Encounter for gynecological examination (general) (routine) without abnormal findings: Secondary | ICD-10-CM | POA: Diagnosis not present

## 2022-03-18 DIAGNOSIS — H1033 Unspecified acute conjunctivitis, bilateral: Secondary | ICD-10-CM | POA: Diagnosis not present

## 2022-03-23 DIAGNOSIS — M654 Radial styloid tenosynovitis [de Quervain]: Secondary | ICD-10-CM | POA: Diagnosis not present

## 2022-03-23 DIAGNOSIS — M461 Sacroiliitis, not elsewhere classified: Secondary | ICD-10-CM | POA: Diagnosis not present

## 2022-03-23 DIAGNOSIS — M9901 Segmental and somatic dysfunction of cervical region: Secondary | ICD-10-CM | POA: Diagnosis not present

## 2022-03-23 DIAGNOSIS — M9905 Segmental and somatic dysfunction of pelvic region: Secondary | ICD-10-CM | POA: Diagnosis not present

## 2022-03-23 DIAGNOSIS — M7912 Myalgia of auxiliary muscles, head and neck: Secondary | ICD-10-CM | POA: Diagnosis not present

## 2022-03-30 DIAGNOSIS — K222 Esophageal obstruction: Secondary | ICD-10-CM | POA: Diagnosis not present

## 2022-03-30 DIAGNOSIS — K760 Fatty (change of) liver, not elsewhere classified: Secondary | ICD-10-CM | POA: Diagnosis not present

## 2022-03-30 DIAGNOSIS — R109 Unspecified abdominal pain: Secondary | ICD-10-CM | POA: Diagnosis not present

## 2022-03-30 DIAGNOSIS — M654 Radial styloid tenosynovitis [de Quervain]: Secondary | ICD-10-CM | POA: Diagnosis not present

## 2022-03-30 DIAGNOSIS — T18128A Food in esophagus causing other injury, initial encounter: Secondary | ICD-10-CM | POA: Diagnosis not present

## 2022-03-31 DIAGNOSIS — K9189 Other postprocedural complications and disorders of digestive system: Secondary | ICD-10-CM | POA: Diagnosis not present

## 2022-03-31 DIAGNOSIS — K219 Gastro-esophageal reflux disease without esophagitis: Secondary | ICD-10-CM | POA: Diagnosis not present

## 2022-03-31 DIAGNOSIS — K222 Esophageal obstruction: Secondary | ICD-10-CM | POA: Diagnosis not present

## 2022-03-31 DIAGNOSIS — Z9884 Bariatric surgery status: Secondary | ICD-10-CM | POA: Diagnosis not present

## 2022-03-31 DIAGNOSIS — R1310 Dysphagia, unspecified: Secondary | ICD-10-CM | POA: Diagnosis not present

## 2022-03-31 DIAGNOSIS — F419 Anxiety disorder, unspecified: Secondary | ICD-10-CM | POA: Diagnosis not present

## 2022-03-31 DIAGNOSIS — Z6841 Body Mass Index (BMI) 40.0 and over, adult: Secondary | ICD-10-CM | POA: Diagnosis not present

## 2022-03-31 DIAGNOSIS — J4520 Mild intermittent asthma, uncomplicated: Secondary | ICD-10-CM | POA: Diagnosis not present

## 2022-03-31 DIAGNOSIS — Z79899 Other long term (current) drug therapy: Secondary | ICD-10-CM | POA: Diagnosis not present

## 2022-03-31 DIAGNOSIS — M0609 Rheumatoid arthritis without rheumatoid factor, multiple sites: Secondary | ICD-10-CM | POA: Diagnosis not present

## 2022-03-31 DIAGNOSIS — J45909 Unspecified asthma, uncomplicated: Secondary | ICD-10-CM | POA: Diagnosis not present

## 2022-03-31 DIAGNOSIS — R1111 Vomiting without nausea: Secondary | ICD-10-CM | POA: Diagnosis not present

## 2022-03-31 DIAGNOSIS — M069 Rheumatoid arthritis, unspecified: Secondary | ICD-10-CM | POA: Diagnosis not present

## 2022-03-31 DIAGNOSIS — Z880 Allergy status to penicillin: Secondary | ICD-10-CM | POA: Diagnosis not present

## 2022-03-31 DIAGNOSIS — K5909 Other constipation: Secondary | ICD-10-CM | POA: Diagnosis not present

## 2022-03-31 DIAGNOSIS — R112 Nausea with vomiting, unspecified: Secondary | ICD-10-CM | POA: Diagnosis not present

## 2022-03-31 DIAGNOSIS — K2100 Gastro-esophageal reflux disease with esophagitis, without bleeding: Secondary | ICD-10-CM | POA: Diagnosis not present

## 2022-03-31 DIAGNOSIS — K3189 Other diseases of stomach and duodenum: Secondary | ICD-10-CM | POA: Diagnosis not present

## 2022-03-31 DIAGNOSIS — I10 Essential (primary) hypertension: Secondary | ICD-10-CM | POA: Diagnosis not present

## 2022-03-31 DIAGNOSIS — T18128A Food in esophagus causing other injury, initial encounter: Secondary | ICD-10-CM | POA: Diagnosis not present

## 2022-03-31 DIAGNOSIS — R4701 Aphasia: Secondary | ICD-10-CM | POA: Diagnosis not present

## 2022-03-31 DIAGNOSIS — G4733 Obstructive sleep apnea (adult) (pediatric): Secondary | ICD-10-CM | POA: Diagnosis not present

## 2022-03-31 DIAGNOSIS — E119 Type 2 diabetes mellitus without complications: Secondary | ICD-10-CM | POA: Diagnosis not present

## 2022-03-31 DIAGNOSIS — Z743 Need for continuous supervision: Secondary | ICD-10-CM | POA: Diagnosis not present

## 2022-03-31 HISTORY — DX: Morbid (severe) obesity due to excess calories: E66.01

## 2022-04-01 DIAGNOSIS — T18128A Food in esophagus causing other injury, initial encounter: Secondary | ICD-10-CM | POA: Diagnosis not present

## 2022-04-01 DIAGNOSIS — E119 Type 2 diabetes mellitus without complications: Secondary | ICD-10-CM | POA: Diagnosis not present

## 2022-04-01 DIAGNOSIS — K219 Gastro-esophageal reflux disease without esophagitis: Secondary | ICD-10-CM | POA: Diagnosis not present

## 2022-04-01 DIAGNOSIS — I10 Essential (primary) hypertension: Secondary | ICD-10-CM | POA: Diagnosis not present

## 2022-04-01 DIAGNOSIS — M0609 Rheumatoid arthritis without rheumatoid factor, multiple sites: Secondary | ICD-10-CM | POA: Diagnosis not present

## 2022-04-01 DIAGNOSIS — J4520 Mild intermittent asthma, uncomplicated: Secondary | ICD-10-CM | POA: Diagnosis not present

## 2022-04-01 DIAGNOSIS — R112 Nausea with vomiting, unspecified: Secondary | ICD-10-CM | POA: Diagnosis not present

## 2022-04-01 DIAGNOSIS — K3189 Other diseases of stomach and duodenum: Secondary | ICD-10-CM | POA: Diagnosis not present

## 2022-04-01 DIAGNOSIS — G4733 Obstructive sleep apnea (adult) (pediatric): Secondary | ICD-10-CM | POA: Diagnosis not present

## 2022-04-01 DIAGNOSIS — R1111 Vomiting without nausea: Secondary | ICD-10-CM | POA: Diagnosis not present

## 2022-04-01 DIAGNOSIS — K222 Esophageal obstruction: Secondary | ICD-10-CM | POA: Diagnosis not present

## 2022-04-01 DIAGNOSIS — Z903 Acquired absence of stomach [part of]: Secondary | ICD-10-CM | POA: Diagnosis not present

## 2022-04-01 DIAGNOSIS — Z6841 Body Mass Index (BMI) 40.0 and over, adult: Secondary | ICD-10-CM | POA: Diagnosis not present

## 2022-04-01 DIAGNOSIS — F419 Anxiety disorder, unspecified: Secondary | ICD-10-CM | POA: Diagnosis not present

## 2022-04-01 DIAGNOSIS — Z79899 Other long term (current) drug therapy: Secondary | ICD-10-CM | POA: Diagnosis not present

## 2022-04-01 DIAGNOSIS — K5909 Other constipation: Secondary | ICD-10-CM | POA: Diagnosis not present

## 2022-04-01 DIAGNOSIS — Z880 Allergy status to penicillin: Secondary | ICD-10-CM | POA: Diagnosis not present

## 2022-04-02 DIAGNOSIS — G4733 Obstructive sleep apnea (adult) (pediatric): Secondary | ICD-10-CM | POA: Diagnosis not present

## 2022-04-02 DIAGNOSIS — F419 Anxiety disorder, unspecified: Secondary | ICD-10-CM | POA: Diagnosis not present

## 2022-04-02 DIAGNOSIS — Z903 Acquired absence of stomach [part of]: Secondary | ICD-10-CM | POA: Diagnosis not present

## 2022-04-02 DIAGNOSIS — M0609 Rheumatoid arthritis without rheumatoid factor, multiple sites: Secondary | ICD-10-CM | POA: Diagnosis not present

## 2022-04-02 DIAGNOSIS — J4520 Mild intermittent asthma, uncomplicated: Secondary | ICD-10-CM | POA: Diagnosis not present

## 2022-04-02 DIAGNOSIS — K5909 Other constipation: Secondary | ICD-10-CM | POA: Diagnosis not present

## 2022-04-02 DIAGNOSIS — R112 Nausea with vomiting, unspecified: Secondary | ICD-10-CM | POA: Diagnosis not present

## 2022-04-02 DIAGNOSIS — R1111 Vomiting without nausea: Secondary | ICD-10-CM | POA: Diagnosis not present

## 2022-04-02 DIAGNOSIS — K3189 Other diseases of stomach and duodenum: Secondary | ICD-10-CM | POA: Diagnosis not present

## 2022-04-02 DIAGNOSIS — K219 Gastro-esophageal reflux disease without esophagitis: Secondary | ICD-10-CM | POA: Diagnosis not present

## 2022-04-02 DIAGNOSIS — I10 Essential (primary) hypertension: Secondary | ICD-10-CM | POA: Diagnosis not present

## 2022-04-02 DIAGNOSIS — Z6841 Body Mass Index (BMI) 40.0 and over, adult: Secondary | ICD-10-CM | POA: Diagnosis not present

## 2022-04-02 DIAGNOSIS — K222 Esophageal obstruction: Secondary | ICD-10-CM | POA: Diagnosis not present

## 2022-04-02 DIAGNOSIS — E119 Type 2 diabetes mellitus without complications: Secondary | ICD-10-CM | POA: Diagnosis not present

## 2022-04-02 DIAGNOSIS — Z880 Allergy status to penicillin: Secondary | ICD-10-CM | POA: Diagnosis not present

## 2022-04-02 DIAGNOSIS — Z79899 Other long term (current) drug therapy: Secondary | ICD-10-CM | POA: Diagnosis not present

## 2022-04-02 DIAGNOSIS — T18128A Food in esophagus causing other injury, initial encounter: Secondary | ICD-10-CM | POA: Diagnosis not present

## 2022-04-03 DIAGNOSIS — I10 Essential (primary) hypertension: Secondary | ICD-10-CM | POA: Diagnosis not present

## 2022-04-03 DIAGNOSIS — G4733 Obstructive sleep apnea (adult) (pediatric): Secondary | ICD-10-CM | POA: Diagnosis not present

## 2022-04-03 DIAGNOSIS — E119 Type 2 diabetes mellitus without complications: Secondary | ICD-10-CM | POA: Diagnosis not present

## 2022-04-03 DIAGNOSIS — Z79899 Other long term (current) drug therapy: Secondary | ICD-10-CM | POA: Diagnosis not present

## 2022-04-03 DIAGNOSIS — K222 Esophageal obstruction: Secondary | ICD-10-CM | POA: Diagnosis not present

## 2022-04-03 DIAGNOSIS — R112 Nausea with vomiting, unspecified: Secondary | ICD-10-CM | POA: Diagnosis not present

## 2022-04-03 DIAGNOSIS — Z6841 Body Mass Index (BMI) 40.0 and over, adult: Secondary | ICD-10-CM | POA: Diagnosis not present

## 2022-04-03 DIAGNOSIS — Z903 Acquired absence of stomach [part of]: Secondary | ICD-10-CM | POA: Diagnosis not present

## 2022-04-03 DIAGNOSIS — K219 Gastro-esophageal reflux disease without esophagitis: Secondary | ICD-10-CM | POA: Diagnosis not present

## 2022-04-03 DIAGNOSIS — F419 Anxiety disorder, unspecified: Secondary | ICD-10-CM | POA: Diagnosis not present

## 2022-04-03 DIAGNOSIS — M0609 Rheumatoid arthritis without rheumatoid factor, multiple sites: Secondary | ICD-10-CM | POA: Diagnosis not present

## 2022-04-03 DIAGNOSIS — K5909 Other constipation: Secondary | ICD-10-CM | POA: Diagnosis not present

## 2022-04-03 DIAGNOSIS — K3189 Other diseases of stomach and duodenum: Secondary | ICD-10-CM | POA: Diagnosis not present

## 2022-04-03 DIAGNOSIS — Z880 Allergy status to penicillin: Secondary | ICD-10-CM | POA: Diagnosis not present

## 2022-04-03 DIAGNOSIS — J4520 Mild intermittent asthma, uncomplicated: Secondary | ICD-10-CM | POA: Diagnosis not present

## 2022-04-03 DIAGNOSIS — T18128A Food in esophagus causing other injury, initial encounter: Secondary | ICD-10-CM | POA: Diagnosis not present

## 2022-04-05 DIAGNOSIS — M461 Sacroiliitis, not elsewhere classified: Secondary | ICD-10-CM | POA: Diagnosis not present

## 2022-04-05 DIAGNOSIS — M9905 Segmental and somatic dysfunction of pelvic region: Secondary | ICD-10-CM | POA: Diagnosis not present

## 2022-04-05 DIAGNOSIS — M7912 Myalgia of auxiliary muscles, head and neck: Secondary | ICD-10-CM | POA: Diagnosis not present

## 2022-04-05 DIAGNOSIS — M9901 Segmental and somatic dysfunction of cervical region: Secondary | ICD-10-CM | POA: Diagnosis not present

## 2022-04-06 DIAGNOSIS — Z79899 Other long term (current) drug therapy: Secondary | ICD-10-CM | POA: Diagnosis not present

## 2022-04-06 DIAGNOSIS — Z5181 Encounter for therapeutic drug level monitoring: Secondary | ICD-10-CM | POA: Diagnosis not present

## 2022-04-06 DIAGNOSIS — M0609 Rheumatoid arthritis without rheumatoid factor, multiple sites: Secondary | ICD-10-CM | POA: Diagnosis not present

## 2022-04-14 DIAGNOSIS — R9431 Abnormal electrocardiogram [ECG] [EKG]: Secondary | ICD-10-CM | POA: Diagnosis not present

## 2022-04-14 DIAGNOSIS — M069 Rheumatoid arthritis, unspecified: Secondary | ICD-10-CM | POA: Diagnosis not present

## 2022-04-14 DIAGNOSIS — R319 Hematuria, unspecified: Secondary | ICD-10-CM | POA: Diagnosis not present

## 2022-04-14 DIAGNOSIS — R197 Diarrhea, unspecified: Secondary | ICD-10-CM | POA: Diagnosis not present

## 2022-04-14 DIAGNOSIS — K2289 Other specified disease of esophagus: Secondary | ICD-10-CM | POA: Diagnosis not present

## 2022-04-14 DIAGNOSIS — R1011 Right upper quadrant pain: Secondary | ICD-10-CM | POA: Diagnosis not present

## 2022-04-14 DIAGNOSIS — K449 Diaphragmatic hernia without obstruction or gangrene: Secondary | ICD-10-CM | POA: Diagnosis not present

## 2022-04-14 DIAGNOSIS — Z87442 Personal history of urinary calculi: Secondary | ICD-10-CM | POA: Diagnosis not present

## 2022-04-14 DIAGNOSIS — K219 Gastro-esophageal reflux disease without esophagitis: Secondary | ICD-10-CM | POA: Diagnosis not present

## 2022-04-14 DIAGNOSIS — R1013 Epigastric pain: Secondary | ICD-10-CM | POA: Diagnosis not present

## 2022-04-14 DIAGNOSIS — R112 Nausea with vomiting, unspecified: Secondary | ICD-10-CM | POA: Diagnosis not present

## 2022-04-14 DIAGNOSIS — Z9884 Bariatric surgery status: Secondary | ICD-10-CM | POA: Diagnosis not present

## 2022-04-16 DIAGNOSIS — M461 Sacroiliitis, not elsewhere classified: Secondary | ICD-10-CM | POA: Diagnosis not present

## 2022-04-16 DIAGNOSIS — M9905 Segmental and somatic dysfunction of pelvic region: Secondary | ICD-10-CM | POA: Diagnosis not present

## 2022-04-16 DIAGNOSIS — M7912 Myalgia of auxiliary muscles, head and neck: Secondary | ICD-10-CM | POA: Diagnosis not present

## 2022-04-16 DIAGNOSIS — M9901 Segmental and somatic dysfunction of cervical region: Secondary | ICD-10-CM | POA: Diagnosis not present

## 2022-04-19 DIAGNOSIS — N182 Chronic kidney disease, stage 2 (mild): Secondary | ICD-10-CM | POA: Diagnosis not present

## 2022-04-19 DIAGNOSIS — K449 Diaphragmatic hernia without obstruction or gangrene: Secondary | ICD-10-CM | POA: Diagnosis not present

## 2022-04-19 DIAGNOSIS — N189 Chronic kidney disease, unspecified: Secondary | ICD-10-CM | POA: Diagnosis not present

## 2022-04-19 DIAGNOSIS — Z9884 Bariatric surgery status: Secondary | ICD-10-CM | POA: Diagnosis not present

## 2022-04-19 DIAGNOSIS — R319 Hematuria, unspecified: Secondary | ICD-10-CM | POA: Diagnosis not present

## 2022-04-19 DIAGNOSIS — R030 Elevated blood-pressure reading, without diagnosis of hypertension: Secondary | ICD-10-CM | POA: Diagnosis not present

## 2022-04-19 DIAGNOSIS — R809 Proteinuria, unspecified: Secondary | ICD-10-CM | POA: Diagnosis not present

## 2022-04-19 DIAGNOSIS — D649 Anemia, unspecified: Secondary | ICD-10-CM | POA: Diagnosis not present

## 2022-04-19 DIAGNOSIS — Z23 Encounter for immunization: Secondary | ICD-10-CM | POA: Diagnosis not present

## 2022-04-19 DIAGNOSIS — R112 Nausea with vomiting, unspecified: Secondary | ICD-10-CM | POA: Diagnosis not present

## 2022-04-19 DIAGNOSIS — I129 Hypertensive chronic kidney disease with stage 1 through stage 4 chronic kidney disease, or unspecified chronic kidney disease: Secondary | ICD-10-CM | POA: Diagnosis not present

## 2022-04-19 DIAGNOSIS — K219 Gastro-esophageal reflux disease without esophagitis: Secondary | ICD-10-CM | POA: Diagnosis not present

## 2022-04-20 DIAGNOSIS — K219 Gastro-esophageal reflux disease without esophagitis: Secondary | ICD-10-CM | POA: Diagnosis not present

## 2022-04-20 DIAGNOSIS — Z903 Acquired absence of stomach [part of]: Secondary | ICD-10-CM | POA: Diagnosis not present

## 2022-04-20 DIAGNOSIS — R1110 Vomiting, unspecified: Secondary | ICD-10-CM | POA: Diagnosis not present

## 2022-04-26 DIAGNOSIS — M9901 Segmental and somatic dysfunction of cervical region: Secondary | ICD-10-CM | POA: Diagnosis not present

## 2022-04-26 DIAGNOSIS — M7912 Myalgia of auxiliary muscles, head and neck: Secondary | ICD-10-CM | POA: Diagnosis not present

## 2022-04-26 DIAGNOSIS — M9905 Segmental and somatic dysfunction of pelvic region: Secondary | ICD-10-CM | POA: Diagnosis not present

## 2022-04-26 DIAGNOSIS — M461 Sacroiliitis, not elsewhere classified: Secondary | ICD-10-CM | POA: Diagnosis not present

## 2022-05-03 DIAGNOSIS — M461 Sacroiliitis, not elsewhere classified: Secondary | ICD-10-CM | POA: Diagnosis not present

## 2022-05-03 DIAGNOSIS — M9905 Segmental and somatic dysfunction of pelvic region: Secondary | ICD-10-CM | POA: Diagnosis not present

## 2022-05-03 DIAGNOSIS — M7912 Myalgia of auxiliary muscles, head and neck: Secondary | ICD-10-CM | POA: Diagnosis not present

## 2022-05-03 DIAGNOSIS — M9901 Segmental and somatic dysfunction of cervical region: Secondary | ICD-10-CM | POA: Diagnosis not present

## 2022-05-07 DIAGNOSIS — M654 Radial styloid tenosynovitis [de Quervain]: Secondary | ICD-10-CM | POA: Diagnosis not present

## 2022-05-10 DIAGNOSIS — M7912 Myalgia of auxiliary muscles, head and neck: Secondary | ICD-10-CM | POA: Diagnosis not present

## 2022-05-10 DIAGNOSIS — M9905 Segmental and somatic dysfunction of pelvic region: Secondary | ICD-10-CM | POA: Diagnosis not present

## 2022-05-10 DIAGNOSIS — M461 Sacroiliitis, not elsewhere classified: Secondary | ICD-10-CM | POA: Diagnosis not present

## 2022-05-10 DIAGNOSIS — M9901 Segmental and somatic dysfunction of cervical region: Secondary | ICD-10-CM | POA: Diagnosis not present

## 2022-05-17 DIAGNOSIS — M461 Sacroiliitis, not elsewhere classified: Secondary | ICD-10-CM | POA: Diagnosis not present

## 2022-05-17 DIAGNOSIS — M9905 Segmental and somatic dysfunction of pelvic region: Secondary | ICD-10-CM | POA: Diagnosis not present

## 2022-05-17 DIAGNOSIS — M7912 Myalgia of auxiliary muscles, head and neck: Secondary | ICD-10-CM | POA: Diagnosis not present

## 2022-05-17 DIAGNOSIS — M9901 Segmental and somatic dysfunction of cervical region: Secondary | ICD-10-CM | POA: Diagnosis not present

## 2022-05-20 DIAGNOSIS — R1013 Epigastric pain: Secondary | ICD-10-CM | POA: Diagnosis not present

## 2022-05-20 DIAGNOSIS — Z903 Acquired absence of stomach [part of]: Secondary | ICD-10-CM | POA: Diagnosis not present

## 2022-05-20 DIAGNOSIS — Z9884 Bariatric surgery status: Secondary | ICD-10-CM | POA: Diagnosis not present

## 2022-05-20 DIAGNOSIS — R112 Nausea with vomiting, unspecified: Secondary | ICD-10-CM | POA: Diagnosis not present

## 2022-05-20 DIAGNOSIS — K2950 Unspecified chronic gastritis without bleeding: Secondary | ICD-10-CM | POA: Diagnosis not present

## 2022-05-20 DIAGNOSIS — R638 Other symptoms and signs concerning food and fluid intake: Secondary | ICD-10-CM | POA: Diagnosis not present

## 2022-05-24 DIAGNOSIS — M0609 Rheumatoid arthritis without rheumatoid factor, multiple sites: Secondary | ICD-10-CM | POA: Diagnosis not present

## 2022-05-25 DIAGNOSIS — M9905 Segmental and somatic dysfunction of pelvic region: Secondary | ICD-10-CM | POA: Diagnosis not present

## 2022-05-25 DIAGNOSIS — M9901 Segmental and somatic dysfunction of cervical region: Secondary | ICD-10-CM | POA: Diagnosis not present

## 2022-05-25 DIAGNOSIS — M7912 Myalgia of auxiliary muscles, head and neck: Secondary | ICD-10-CM | POA: Diagnosis not present

## 2022-05-25 DIAGNOSIS — M461 Sacroiliitis, not elsewhere classified: Secondary | ICD-10-CM | POA: Diagnosis not present

## 2022-05-30 DIAGNOSIS — T8141XA Infection following a procedure, superficial incisional surgical site, initial encounter: Secondary | ICD-10-CM | POA: Diagnosis not present

## 2022-05-31 DIAGNOSIS — Z87448 Personal history of other diseases of urinary system: Secondary | ICD-10-CM | POA: Diagnosis not present

## 2022-05-31 DIAGNOSIS — Z5181 Encounter for therapeutic drug level monitoring: Secondary | ICD-10-CM | POA: Diagnosis not present

## 2022-05-31 DIAGNOSIS — M0609 Rheumatoid arthritis without rheumatoid factor, multiple sites: Secondary | ICD-10-CM | POA: Diagnosis not present

## 2022-05-31 DIAGNOSIS — Z79899 Other long term (current) drug therapy: Secondary | ICD-10-CM | POA: Diagnosis not present

## 2022-06-01 DIAGNOSIS — M7912 Myalgia of auxiliary muscles, head and neck: Secondary | ICD-10-CM | POA: Diagnosis not present

## 2022-06-01 DIAGNOSIS — M9901 Segmental and somatic dysfunction of cervical region: Secondary | ICD-10-CM | POA: Diagnosis not present

## 2022-06-01 DIAGNOSIS — M461 Sacroiliitis, not elsewhere classified: Secondary | ICD-10-CM | POA: Diagnosis not present

## 2022-06-01 DIAGNOSIS — M9905 Segmental and somatic dysfunction of pelvic region: Secondary | ICD-10-CM | POA: Diagnosis not present

## 2022-06-07 DIAGNOSIS — M069 Rheumatoid arthritis, unspecified: Secondary | ICD-10-CM | POA: Diagnosis not present

## 2022-06-07 DIAGNOSIS — Z6841 Body Mass Index (BMI) 40.0 and over, adult: Secondary | ICD-10-CM | POA: Diagnosis not present

## 2022-06-09 DIAGNOSIS — R112 Nausea with vomiting, unspecified: Secondary | ICD-10-CM | POA: Diagnosis not present

## 2022-06-14 DIAGNOSIS — M461 Sacroiliitis, not elsewhere classified: Secondary | ICD-10-CM | POA: Diagnosis not present

## 2022-06-14 DIAGNOSIS — M9905 Segmental and somatic dysfunction of pelvic region: Secondary | ICD-10-CM | POA: Diagnosis not present

## 2022-06-14 DIAGNOSIS — Z9884 Bariatric surgery status: Secondary | ICD-10-CM | POA: Diagnosis not present

## 2022-06-14 DIAGNOSIS — Z48815 Encounter for surgical aftercare following surgery on the digestive system: Secondary | ICD-10-CM | POA: Diagnosis not present

## 2022-06-14 DIAGNOSIS — M7912 Myalgia of auxiliary muscles, head and neck: Secondary | ICD-10-CM | POA: Diagnosis not present

## 2022-06-14 DIAGNOSIS — M9901 Segmental and somatic dysfunction of cervical region: Secondary | ICD-10-CM | POA: Diagnosis not present

## 2022-06-14 DIAGNOSIS — K219 Gastro-esophageal reflux disease without esophagitis: Secondary | ICD-10-CM | POA: Diagnosis not present

## 2022-06-14 DIAGNOSIS — R112 Nausea with vomiting, unspecified: Secondary | ICD-10-CM | POA: Diagnosis not present

## 2022-06-21 DIAGNOSIS — M9901 Segmental and somatic dysfunction of cervical region: Secondary | ICD-10-CM | POA: Diagnosis not present

## 2022-06-21 DIAGNOSIS — M461 Sacroiliitis, not elsewhere classified: Secondary | ICD-10-CM | POA: Diagnosis not present

## 2022-06-21 DIAGNOSIS — M9905 Segmental and somatic dysfunction of pelvic region: Secondary | ICD-10-CM | POA: Diagnosis not present

## 2022-06-21 DIAGNOSIS — M7912 Myalgia of auxiliary muscles, head and neck: Secondary | ICD-10-CM | POA: Diagnosis not present

## 2022-06-22 DIAGNOSIS — Z9884 Bariatric surgery status: Secondary | ICD-10-CM | POA: Diagnosis not present

## 2022-06-22 DIAGNOSIS — Z79899 Other long term (current) drug therapy: Secondary | ICD-10-CM | POA: Diagnosis not present

## 2022-06-22 DIAGNOSIS — R1013 Epigastric pain: Secondary | ICD-10-CM | POA: Diagnosis not present

## 2022-06-22 DIAGNOSIS — G43909 Migraine, unspecified, not intractable, without status migrainosus: Secondary | ICD-10-CM | POA: Diagnosis not present

## 2022-06-22 DIAGNOSIS — N390 Urinary tract infection, site not specified: Secondary | ICD-10-CM | POA: Diagnosis not present

## 2022-06-22 DIAGNOSIS — Z903 Acquired absence of stomach [part of]: Secondary | ICD-10-CM | POA: Diagnosis not present

## 2022-06-22 DIAGNOSIS — J449 Chronic obstructive pulmonary disease, unspecified: Secondary | ICD-10-CM | POA: Diagnosis not present

## 2022-06-22 DIAGNOSIS — I10 Essential (primary) hypertension: Secondary | ICD-10-CM | POA: Diagnosis not present

## 2022-06-22 DIAGNOSIS — R112 Nausea with vomiting, unspecified: Secondary | ICD-10-CM | POA: Diagnosis not present

## 2022-06-22 DIAGNOSIS — F419 Anxiety disorder, unspecified: Secondary | ICD-10-CM | POA: Diagnosis not present

## 2022-06-22 DIAGNOSIS — Z6841 Body Mass Index (BMI) 40.0 and over, adult: Secondary | ICD-10-CM | POA: Diagnosis not present

## 2022-06-22 DIAGNOSIS — Z8639 Personal history of other endocrine, nutritional and metabolic disease: Secondary | ICD-10-CM | POA: Diagnosis not present

## 2022-06-22 DIAGNOSIS — K224 Dyskinesia of esophagus: Secondary | ICD-10-CM | POA: Diagnosis not present

## 2022-06-22 DIAGNOSIS — Z880 Allergy status to penicillin: Secondary | ICD-10-CM | POA: Diagnosis not present

## 2022-06-22 DIAGNOSIS — D72829 Elevated white blood cell count, unspecified: Secondary | ICD-10-CM | POA: Diagnosis not present

## 2022-06-22 DIAGNOSIS — Z885 Allergy status to narcotic agent status: Secondary | ICD-10-CM | POA: Diagnosis not present

## 2022-06-22 DIAGNOSIS — M069 Rheumatoid arthritis, unspecified: Secondary | ICD-10-CM | POA: Diagnosis not present

## 2022-06-22 DIAGNOSIS — E860 Dehydration: Secondary | ICD-10-CM | POA: Diagnosis not present

## 2022-06-22 DIAGNOSIS — Z881 Allergy status to other antibiotic agents status: Secondary | ICD-10-CM | POA: Diagnosis not present

## 2022-06-22 DIAGNOSIS — K2100 Gastro-esophageal reflux disease with esophagitis, without bleeding: Secondary | ICD-10-CM | POA: Diagnosis not present

## 2022-06-22 DIAGNOSIS — K219 Gastro-esophageal reflux disease without esophagitis: Secondary | ICD-10-CM | POA: Diagnosis not present

## 2022-06-22 DIAGNOSIS — K449 Diaphragmatic hernia without obstruction or gangrene: Secondary | ICD-10-CM | POA: Diagnosis not present

## 2022-06-23 DIAGNOSIS — K209 Esophagitis, unspecified without bleeding: Secondary | ICD-10-CM | POA: Diagnosis not present

## 2022-06-23 DIAGNOSIS — Z9884 Bariatric surgery status: Secondary | ICD-10-CM | POA: Diagnosis not present

## 2022-06-23 DIAGNOSIS — G43909 Migraine, unspecified, not intractable, without status migrainosus: Secondary | ICD-10-CM | POA: Diagnosis not present

## 2022-06-23 DIAGNOSIS — Z903 Acquired absence of stomach [part of]: Secondary | ICD-10-CM | POA: Diagnosis not present

## 2022-06-23 DIAGNOSIS — R112 Nausea with vomiting, unspecified: Secondary | ICD-10-CM | POA: Diagnosis not present

## 2022-06-23 DIAGNOSIS — K449 Diaphragmatic hernia without obstruction or gangrene: Secondary | ICD-10-CM | POA: Diagnosis not present

## 2022-06-23 DIAGNOSIS — K2289 Other specified disease of esophagus: Secondary | ICD-10-CM | POA: Diagnosis not present

## 2022-06-23 DIAGNOSIS — B3781 Candidal esophagitis: Secondary | ICD-10-CM | POA: Diagnosis not present

## 2022-06-23 DIAGNOSIS — K9589 Other complications of other bariatric procedure: Secondary | ICD-10-CM | POA: Diagnosis not present

## 2022-06-24 DIAGNOSIS — R112 Nausea with vomiting, unspecified: Secondary | ICD-10-CM | POA: Diagnosis not present

## 2022-06-24 DIAGNOSIS — K9589 Other complications of other bariatric procedure: Secondary | ICD-10-CM | POA: Diagnosis not present

## 2022-06-24 DIAGNOSIS — N3000 Acute cystitis without hematuria: Secondary | ICD-10-CM | POA: Diagnosis not present

## 2022-06-24 DIAGNOSIS — Z903 Acquired absence of stomach [part of]: Secondary | ICD-10-CM | POA: Diagnosis not present

## 2022-06-26 DIAGNOSIS — N390 Urinary tract infection, site not specified: Secondary | ICD-10-CM

## 2022-06-26 DIAGNOSIS — B3781 Candidal esophagitis: Secondary | ICD-10-CM | POA: Insufficient documentation

## 2022-06-26 HISTORY — DX: Candidal esophagitis: B37.81

## 2022-06-26 HISTORY — DX: Urinary tract infection, site not specified: N39.0

## 2022-06-29 DIAGNOSIS — R112 Nausea with vomiting, unspecified: Secondary | ICD-10-CM | POA: Diagnosis not present

## 2022-06-29 DIAGNOSIS — Z903 Acquired absence of stomach [part of]: Secondary | ICD-10-CM | POA: Diagnosis not present

## 2022-06-29 DIAGNOSIS — K449 Diaphragmatic hernia without obstruction or gangrene: Secondary | ICD-10-CM | POA: Diagnosis not present

## 2022-06-29 DIAGNOSIS — R1110 Vomiting, unspecified: Secondary | ICD-10-CM | POA: Diagnosis not present

## 2022-06-29 DIAGNOSIS — K2100 Gastro-esophageal reflux disease with esophagitis, without bleeding: Secondary | ICD-10-CM | POA: Diagnosis not present

## 2022-06-29 DIAGNOSIS — B3781 Candidal esophagitis: Secondary | ICD-10-CM | POA: Diagnosis not present

## 2022-06-29 DIAGNOSIS — K219 Gastro-esophageal reflux disease without esophagitis: Secondary | ICD-10-CM | POA: Diagnosis not present

## 2022-07-04 DIAGNOSIS — R110 Nausea: Secondary | ICD-10-CM | POA: Diagnosis not present

## 2022-07-04 DIAGNOSIS — M5459 Other low back pain: Secondary | ICD-10-CM | POA: Diagnosis not present

## 2022-07-04 DIAGNOSIS — N390 Urinary tract infection, site not specified: Secondary | ICD-10-CM | POA: Diagnosis not present

## 2022-08-11 ENCOUNTER — Other Ambulatory Visit: Payer: Self-pay

## 2022-08-11 ENCOUNTER — Encounter (HOSPITAL_COMMUNITY): Payer: Self-pay | Admitting: Emergency Medicine

## 2022-08-11 ENCOUNTER — Emergency Department (HOSPITAL_COMMUNITY)
Admission: EM | Admit: 2022-08-11 | Discharge: 2022-08-12 | Disposition: A | Discharge status: Home / Self Care | Payer: 59 | Attending: Emergency Medicine | Admitting: Emergency Medicine

## 2022-08-11 DIAGNOSIS — I82622 Acute embolism and thrombosis of deep veins of left upper extremity: Secondary | ICD-10-CM

## 2022-08-11 DIAGNOSIS — H538 Other visual disturbances: Secondary | ICD-10-CM | POA: Diagnosis not present

## 2022-08-11 DIAGNOSIS — R519 Headache, unspecified: Secondary | ICD-10-CM | POA: Diagnosis present

## 2022-08-11 HISTORY — DX: Rheumatoid arthritis without rheumatoid factor, unspecified site: M06.00

## 2022-08-11 HISTORY — DX: Type 2 diabetes mellitus without complications: E11.9

## 2022-08-11 HISTORY — DX: Unspecified asthma, uncomplicated: J45.909

## 2022-08-11 HISTORY — DX: Acute embolism and thrombosis of unspecified deep veins of unspecified lower extremity: I82.409

## 2022-08-11 LAB — COMPREHENSIVE METABOLIC PANEL
ALT: 29 U/L (ref 0–44)
AST: 37 U/L (ref 15–41)
Albumin: 4 g/dL (ref 3.5–5.0)
Alkaline Phosphatase: 67 U/L (ref 38–126)
Anion gap: 11 (ref 5–15)
BUN: 12 mg/dL (ref 6–20)
CO2: 24 mmol/L (ref 22–32)
Calcium: 9.8 mg/dL (ref 8.9–10.3)
Chloride: 99 mmol/L (ref 98–111)
Creatinine, Ser: 1.01 mg/dL — ABNORMAL HIGH (ref 0.44–1.00)
GFR, Estimated: >60 mL/min (ref >60)
Glucose, Bld: 110 mg/dL — ABNORMAL HIGH (ref 70–99)
Potassium: 3.4 mmol/L — ABNORMAL LOW (ref 3.5–5.1)
Sodium: 134 mmol/L — ABNORMAL LOW (ref 135–145)
Total Bilirubin: 0.8 mg/dL (ref 0.3–1.2)
Total Protein: 8.7 g/dL — ABNORMAL HIGH (ref 6.5–8.1)

## 2022-08-11 LAB — CBC WITH DIFFERENTIAL/PLATELET
Abs Immature Granulocytes: 0.05 10*3/uL (ref 0.00–0.07)
Basophils Absolute: 0.1 10*3/uL (ref 0.0–0.1)
Basophils Relative: 1 %
Eosinophils Absolute: 0.2 10*3/uL (ref 0.0–0.5)
Eosinophils Relative: 1 %
HCT: 48.8 % — ABNORMAL HIGH (ref 36.0–46.0)
Hemoglobin: 16.7 g/dL — ABNORMAL HIGH (ref 12.0–15.0)
Immature Granulocytes: 0 %
Lymphocytes Relative: 28 %
Lymphs Abs: 4.1 10*3/uL — ABNORMAL HIGH (ref 0.7–4.0)
MCH: 29.5 pg (ref 26.0–34.0)
MCHC: 34.2 g/dL (ref 30.0–36.0)
MCV: 86.2 fL (ref 80.0–100.0)
Monocytes Absolute: 1.1 10*3/uL — ABNORMAL HIGH (ref 0.1–1.0)
Monocytes Relative: 8 %
Neutro Abs: 9.2 10*3/uL — ABNORMAL HIGH (ref 1.7–7.7)
Neutrophils Relative %: 62 %
Platelets: 316 10*3/uL (ref 150–400)
RBC: 5.66 MIL/uL — ABNORMAL HIGH (ref 3.87–5.11)
RDW: 14.7 % (ref 11.5–15.5)
WBC: 14.7 10*3/uL — ABNORMAL HIGH (ref 4.0–10.5)
nRBC: 0 % (ref 0.0–0.2)

## 2022-08-11 NOTE — ED Provider Triage Note (Signed)
Emergency Medicine Provider Triage Evaluation Note  Casey [name] , a 52 y.o. female  was evaluated in triage.  Pt complains of headache and left hand weakness.  Patient reports that she was diagnosed with a left upper extremity DVT at [facility] few days ago and started on heparin and then transition to Eliquis.  Reports that she noted a considerable headache that started on earlier this afternoon as progressed to having left arm weakness.  Review of Systems  Positive: As above Negative: As above  Physical Exam  BP (!) 143/97   Pulse 94   Temp 98.4 F (36.9 C)   Resp 18   Ht 5\' 3"  (1.6 m)   Wt 104.3 kg   SpO2 99%   BMI 40.74 kg/m  Gen:   Awake, no distress   Resp:  Normal effort  MSK:   Moves extremities without difficulty  Other:    Medical Decision Making  Medically screening exam initiated at 9:13 PM.  Appropriate orders placed.  Casey [name] was informed that the remainder of the evaluation will be completed by another provider, this initial triage assessment does not replace that evaluation, and the importance of remaining in the ED until their evaluation is complete.     Casey Heller, PA-C 08/11/22 2117

## 2022-08-11 NOTE — ED Triage Notes (Signed)
Pt c/o headache and vision changes that started today. Pt was diagnosed with dvt in her LUE on Friday at [facility] and was placed on heparin while admitted and eliquis since being discharged. Pt ambulatory to triage with a steady gait.

## 2022-08-12 ENCOUNTER — Emergency Department (HOSPITAL_COMMUNITY): Payer: 59

## 2022-08-12 MED ORDER — GADOBUTROL 1 MMOL/ML IV SOLN
10.0000 mL | Freq: Once | INTRAVENOUS | Status: AC | PRN
  Administered 2022-08-12: 10 mL via INTRAVENOUS

## 2022-08-12 MED ORDER — IOHEXOL 350 MG/ML SOLN
75.0000 mL | Freq: Once | INTRAVENOUS | Status: AC | PRN
  Administered 2022-08-12: 75 mL via INTRAVENOUS

## 2022-08-12 MED ORDER — OXYCODONE-ACETAMINOPHEN 5-325 MG PO TABS
1.0000 | ORAL_TABLET | Freq: Once | ORAL | Status: AC
  Administered 2022-08-12: 1 via ORAL
  Filled 2022-08-12: qty 1

## 2022-08-12 MED ORDER — OXYCODONE HCL 5 MG PO TABS: 5.0000 mg | ORAL_TABLET | ORAL | 0 refills | Status: DC | PRN

## 2022-08-12 MED ORDER — OXYCODONE HCL 5 MG PO TABS
5.0000 mg | ORAL_TABLET | Freq: Once | ORAL | Status: AC
  Administered 2022-08-12: 5 mg via ORAL
  Filled 2022-08-12: qty 1

## 2022-08-12 MED ORDER — HYDRALAZINE HCL 20 MG/ML IJ SOLN
10.0000 mg | Freq: Once | INTRAMUSCULAR | Status: AC
  Administered 2022-08-12: 10 mg via INTRAVENOUS
  Filled 2022-08-12: qty 1

## 2022-08-12 NOTE — ED Provider Notes (Signed)
[facility] EMERGENCY DEPARTMENT Provider Note   CSN: [id number] Arrival date & time: 08/11/22  1831     History  Chief Complaint  Patient presents with   Headache    Casey [name] is a 52 y.o. 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  She reports gradual onset of symptoms yesterday afternoon with posterior headache which is unusual for her, she typically gets anterior tension type headaches.  She said her vision was intermittently blurred as well.  She feels that her left arm has been more painful and "heavy" over the past 2 days, although she has had a lot of pain and paresthesias since a diagnosis of her DVT.  She reports she has been compliant with her Eliquis.  She denies fevers, chills, neck stiffness, or any other infection symptoms.  HPI     Home Medications Prior to Admission medications   Not on File      Allergies    Advair hfa [fluticasone-salmeterol], Asa [aspirin], Cleocin [clindamycin], Clindamycin/lincomycin, Fludrocortisone acetate, Gabapentin, Hydrochlorothiazide, Imipramine, Ipratropium bromide, Medroxyprogesterone, Meloxicam, Metformin and related, Nystatin, Penicillins, Pred forte [prednisolone], Prednisone, Proventil hfa [albuterol], Solu-medrol [methylprednisolone], Sulfa antibiotics, Tegaderm ag mesh [silver], Toradol [ketorolac tromethamine], and Tramadol    Review of Systems   Review of Systems  Physical Exam Updated Vital Signs BP 119/71 (BP Location: Right Arm)   Pulse 91   Temp 97.7 F (36.5 C) (Oral)   Resp 16   Ht 5\' 3"  (1.6 m)   Wt 104.3 kg   SpO2 98%   BMI 40.74 kg/m  Physical Exam Constitutional:      General: She is not in acute distress. HENT:     Head: Normocephalic and atraumatic.  Eyes:     Conjunctiva/sclera: Conjunctivae normal.     Pupils: Pupils are equal, round, and reactive  to light.  Cardiovascular:     Rate and Rhythm: Normal rate and regular rhythm.  Pulmonary:     Effort: Pulmonary effort is normal. No respiratory distress.  Abdominal:     General: There is no distension.     Tenderness: There is no abdominal tenderness.  Neurological:     General: No focal deficit present.     Mental Status: She is alert and oriented to person, place, and time.     GCS: GCS eye subscore is 4. GCS verbal subscore is 5. GCS motor subscore is 6.     Cranial Nerves: No cranial nerve deficit, dysarthria or facial asymmetry.     Comments: Left arm range of motion testing is limited by pain; paresthesias reported in left arm  Psychiatric:        Mood and Affect: Mood normal.        Behavior: Behavior normal.     ED Results / Procedures / Treatments   Labs (all labs ordered are listed, but only abnormal results are displayed) Labs Reviewed  COMPREHENSIVE METABOLIC PANEL - Abnormal; Notable for the following components:      Result Value   Sodium 134 (*)    Potassium 3.4 (*)    Glucose, Bld 110 (*)    Creatinine, Ser 1.01 (*)    Total Protein 8.7 (*)    All other components within normal limits  CBC WITH DIFFERENTIAL/PLATELET - Abnormal; Notable for the following components:   WBC 14.7 (*)    RBC 5.66 (*)  Hemoglobin 16.7 (*)    HCT 48.8 (*)    Neutro Abs 9.2 (*)    Lymphs Abs 4.1 (*)    Monocytes Absolute 1.1 (*)    All other components within normal limits    EKG None  Radiology CT Angio Chest PE W/Cm &/Or Wo Cm  Result Date: 08/12/2022 CLINICAL DATA:  Headache and left arm DVT. EXAM: CT ANGIOGRAPHY CHEST WITH CONTRAST TECHNIQUE: Multidetector CT imaging of the chest was performed using the standard protocol during bolus administration of intravenous contrast. Multiplanar CT image reconstructions and MIPs were obtained to evaluate the vascular anatomy. RADIATION DOSE REDUCTION: This exam was performed according to the departmental dose-optimization  program which includes automated exposure control, adjustment of the mA and/or kV according to patient size and/or use of iterative reconstruction technique. CONTRAST:  10mL OMNIPAQUE IOHEXOL 350 MG/ML SOLN COMPARISON:  November 24, 2020 FINDINGS: Cardiovascular: Satisfactory opacification of the pulmonary arteries to the segmental level. No evidence of pulmonary embolism. Normal heart size with mild to moderate severity coronary artery calcification. No pericardial effusion. Mediastinum/Nodes: No enlarged mediastinal, hilar, or axillary lymph nodes. Thyroid gland, trachea, and esophagus demonstrate no significant findings. Lungs/Pleura: Lungs are clear. No pleural effusion or pneumothorax. Upper Abdomen: There is a stable moderate to large hiatal hernia. Surgical sutures are seen within the gastric region. Musculoskeletal: No chest wall abnormality. No acute or significant osseous findings. Review of the MIP images confirms the above findings. IMPRESSION: 1. No evidence of pulmonary embolism or other acute intrathoracic process. 2. Stable moderate to large hiatal hernia. 3. Mild to moderate severity coronary artery calcification. Electronically Signed   By: Virgina Norfolk M.D.   On: 08/12/2022 01:45   CT Head Wo Contrast  Result Date: 08/12/2022 CLINICAL DATA:  Headache and left arm DVT. EXAM: CT HEAD WITHOUT CONTRAST TECHNIQUE: Contiguous axial images were obtained from the base of the skull through the vertex without intravenous contrast. RADIATION DOSE REDUCTION: This exam was performed according to the departmental dose-optimization program which includes automated exposure control, adjustment of the mA and/or kV according to patient size and/or use of iterative reconstruction technique. COMPARISON:  None Available. FINDINGS: Brain: No evidence of acute infarction, hemorrhage, hydrocephalus, extra-axial collection or mass lesion/mass effect. Vascular: No hyperdense vessel or unexpected calcification. Skull:  Normal. Negative for fracture or focal lesion. Sinuses/Orbits: No acute finding. Other: None. IMPRESSION: No acute intracranial pathology. Electronically Signed   By: Virgina Norfolk M.D.   On: 08/12/2022 01:42    Procedures Procedures    Medications Ordered in ED Medications  iohexol (OMNIPAQUE) 350 MG/ML injection 75 mL (75 mLs Intravenous Contrast Given 08/12/22 0137)  hydrALAZINE (APRESOLINE) injection 10 mg (10 mg Intravenous Given 08/12/22 1225)  oxyCODONE (Oxy IR/ROXICODONE) immediate release tablet 5 mg (5 mg Oral Given 08/12/22 1222)  gadobutrol (GADAVIST) 1 MMOL/ML injection 10 mL (10 mLs Intravenous Contrast Given 08/12/22 1713)    ED Course/ Medical Decision Making/ A&P Clinical Course as of 08/12/22 1728  Thu Aug 12, 2022  1558 Signed out to Dr Gilford Raid. [MT]    Clinical Course User Index [MT] Janisse Ghan, Carola Rhine, MD                           Medical Decision Making Amount and/or Complexity of Data Reviewed Radiology: ordered.  Risk Prescription drug management.   This patient presents to the Emergency Department with complaint of headache.  This involves an extensive number of treatment options, and  is a complaint that carries with it a high risk of complications and morbidity.  The differential diagnosis for headache includes tension type headache vs occipital headache vs migraine vs sinusitis vs intracranial bleed vs other  Patient is also complaining of left arm heaviness, which may be related to DVT diagnosis.  It is difficult exclude the possibility of a CNS lesion.  CT scan of the head ordered from triage, which did not show any acute lesions.  But I have ordered MRI of the brain to evaluate for posterior lesion.  I discussed imaging with Dr Curly Shores from neurology who felt that MRI/MRA and MRV collectively would be appropriate imaging to evaluate for posterior lesions or cerebral thrombosis.  Please note that the patient was triaged yesterday afternoon and had initial  blood work and CT scan ordered after medical screening.  She had a prolonged wait overnight prior to being roomed and evaluated by myself this morning.  I ordered, reviewed, and interpreted labs, including BMP and CBC.  There were no immediate, life-threatening emergencies found in this labwork.  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  I ordered medication oxycodone for headache.  The patient has an extensive list of allergies and cannot tolerate most medications, but does okay with oxycodone, per her report.  I independently visualized and interpreted imaging which showed no acute pulmonary embolism, no acute lesion noted on CT scan of the brain.  MRI imaging was pending at the time of signout  After the interventions stated above, I reevaluated the patient and found that the patient remained clinically stable. Her headache had not worsened objectively in the 20+ hours she had been in the ED, nor did she develop further signs of infection or stroke.  She has no blurred vision to suggest advanced pseudotumor or increasing intracranial pressure.  I did not feel she was requiring emergent LP at this time.  She was signed out at 4 pm to Dr Gilford Raid EDP pending follow up on her neuro imaging/MRI.         Final Clinical Impression(s) / ED Diagnoses Final diagnoses:  None    Rx / DC Orders ED Discharge Orders     None         Francely Craw, Carola Rhine, MD 08/12/22 1728

## 2022-08-12 NOTE — ED Provider Notes (Signed)
Pt signed out by Dr. Langston Masker pending MRI brain, MRA, and MRV:   1. No acute intracranial abnormality.  2. No intracranial or extracranial arterial stenosis.  3. No definite evidence of dural venous sinus or deep cerebral vein  thrombosis.  4. Asymmetrically decreased flow in the left sigmoid sinus compared  to the right is likely physiologic. If there is high clinical  concern for dural venous sinus thrombosis, further evaluation with a  CT venogram could be considered.   Pt's headache is better.  Blurry vision is better.  LUE painful; likely from DVT.  As pt is already anticoagulated, it is unlikely she has a dural venous thrombosis.  She is told to return if severe headache returns.  She has no f/u for her DVT, so she's told to f/u with vascular.  She is to return if worse.  F/u with pcp as well.   Isla Pence, MD 08/12/22 Tresa Moore

## 2022-08-12 NOTE — ED Notes (Signed)
Pt states that she has a headache "on the base of skull."

## 2022-08-12 NOTE — ED Notes (Signed)
Patient transported to MRI 

## 2022-08-13 ENCOUNTER — Telehealth: Payer: Self-pay | Admitting: Internal Medicine

## 2022-08-13 NOTE — Telephone Encounter (Signed)
Good afternoon Dr. Hilarie Fredrickson  We received a referral for the following patient for esophageal candidiasis, GERD, sleeve gastrectomy, chronic vomiting, and dysphagia. Patient wants to transfer to [place] because she is not trusting the previous provider and they have no coverage for her bariatric treatment. We have records to review on Epic. Please review and advise of scheduling. Thank you

## 2022-08-16 ENCOUNTER — Ambulatory Visit (HOSPITAL_BASED_OUTPATIENT_CLINIC_OR_DEPARTMENT_OTHER)
Admission: RE | Admit: 2022-08-16 | Discharge: 2022-08-16 | Disposition: A | Discharge status: Home / Self Care | Payer: 59 | Source: Ambulatory Visit | Attending: Nurse Practitioner | Admitting: Nurse Practitioner

## 2022-08-16 ENCOUNTER — Other Ambulatory Visit (HOSPITAL_COMMUNITY): Payer: Self-pay | Admitting: Nurse Practitioner

## 2022-08-16 DIAGNOSIS — I82622 Acute embolism and thrombosis of deep veins of left upper extremity: Secondary | ICD-10-CM

## 2022-08-17 ENCOUNTER — Encounter: Payer: Self-pay | Admitting: Vascular Surgery

## 2022-08-17 ENCOUNTER — Ambulatory Visit (HOSPITAL_COMMUNITY): Payer: 59

## 2022-08-17 ENCOUNTER — Ambulatory Visit (INDEPENDENT_AMBULATORY_CARE_PROVIDER_SITE_OTHER): Payer: 59 | Admitting: Vascular Surgery

## 2022-08-17 ENCOUNTER — Other Ambulatory Visit (HOSPITAL_COMMUNITY): Payer: Self-pay

## 2022-08-17 ENCOUNTER — Telehealth: Payer: Self-pay

## 2022-08-17 VITALS — BP 135/93 | HR 104 | Temp 98.4°F | Resp 16 | Ht 63.0 in | Wt 224.0 lb

## 2022-08-17 DIAGNOSIS — I82622 Acute embolism and thrombosis of deep veins of left upper extremity: Secondary | ICD-10-CM

## 2022-08-17 HISTORY — DX: Acute embolism and thrombosis of deep veins of left upper extremity: I82.622

## 2022-08-17 MED ORDER — APIXABAN 5 MG PO TABS: 5.0000 mg | ORAL_TABLET | Freq: Two times a day (BID) | ORAL | 1 refills | Status: DC

## 2022-08-17 NOTE — Telephone Encounter (Signed)
Casey [name] with the DVT clinic called requesting an appt for this pt.  [name], two identifiers used. Informed that since Radiology read the [place] report at [facility], the DVT clinic can't see the pt until she is seen in office by a vascular MD. Pt is c/o pain and swelling associated with her LUE DVT. She is currently on anticoagulation.  Upon review of chart, pt has never been seen at this office. Pt was initially dx with DVT at [facility]. Discussed with Dr. Carlis Abbott, d/t his name on d/c paperwork, who consulted with Joneen Caraway. Per Dr. Carlis Abbott, ok to add to today's schedule.  Called pt, two identifiers used. Appt scheduled. Pt asked to bring current list of meds since she was seen at an outside facility. Confirmed understanding.

## 2022-08-17 NOTE — Progress Notes (Signed)
Patient name: Casey [name] MRN: [id number] DOB: [birth year] Sex: female  REASON FOR CONSULT: DVT left brachial vein  HPI: Casey [name] is a 52 y.o. female, with hx DM and RA that presents for evaluation of left brachial vein DVT. She was initially diagnosed with left brachial vein DVT at [facility] on 08/06/22.  There report indicates left brachial vein catheter associated DVT.  The patient reports she was hospitalized and had an IV in her left arm at the site of DVT for antibiotics for UTI.  She was prescribed Eliquis at discharge for the arm DVT.  She recently went to our ED with headaches and another duplex was obtained of her left arm again showing a partial left brachial vein DVT.  She states no prior thromboembolic events in the past.  No family history of clotting disorders.  Has been taking her Eliquis.  She is unclear about how long to take her Eliquis.  Also complaints of some numbness in her left arm.  Past Medical History:  Diagnosis Date   Asthma    Diabetes mellitus without complication (HCC)    DVT (deep venous thrombosis) (HCC)    Sero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 arthritis (HCC)     Past Surgical History:  Procedure Laterality Date   EYE SURGERY     LAPAROSCOPIC GASTRIC SLEEVE RESECTION      History reviewed. No pertinent family history.  SOCIAL HISTORY: Social History   Socioeconomic History   Marital status: Married    Spouse name: Not on file   Number of children: Not on file   Years of education: Not on file   Highest education level: Not on file  Occupational History   Not on file  Tobacco Use   Smoking status: Never   Smokeless tobacco: Never  Substance and Sexual Activity   Alcohol use: Never   Drug use: Never   Sexual activity: Not on file  Other Topics Concern   Not on file  Social History Narrative   Not on file   Social Determinants of Health   Financial Resource Strain: Not on file  Food Insecurity: Not on file  Transportation Needs: Not on file   Physical Activity: Not on file  Stress: Not on file  Social Connections: Not on file  Intimate Partner Violence: Not on file    Allergies  Allergen Reactions   Advair Hfa [Fluticasone-Salmeterol]    Asa [Aspirin]    Cleocin [Clindamycin]    Clindamycin/Lincomycin    Fludrocortisone Acetate    Gabapentin    Hydrochlorothiazide    Imipramine    Ipratropium Bromide    Medroxyprogesterone    Meloxicam    Metformin And Related    Nystatin    Penicillins    Pred Forte [Prednisolone]    Prednisone    Proventil Hfa [Albuterol]    Solu-Medrol [Methylprednisolone]    Sulfa Antibiotics    Tegaderm Ag Mesh [Silver]    Toradol [Ketorolac Tromethamine]    Tramadol     Current Outpatient Medications  Medication Sig Dispense Refill   apixaban (ELIQUIS) 5 MG TABS tablet Take 5 mg by mouth 2 (two) times daily.     B Complex-C (B-COMPLEX WITH VITAMIN C) tablet Take 1 tablet by mouth daily.     b complex vitamins capsule Take 1 capsule by mouth daily.     oxyCODONE (OXY IR/ROXICODONE) 5 MG immediate release tablet Take 1 tablet (5 mg total) by mouth every 4 (four) hours as needed for severe pain.  10 tablet 0   No current facility-administered medications for this visit.    REVIEW OF SYSTEMS:  [X]  denotes positive finding, [ ]  denotes negative finding Cardiac  Comments:  Chest pain or chest pressure:    Shortness of breath upon exertion:    Short of breath when lying flat:    Irregular heart rhythm:        Vascular    Pain in calf, thigh, or hip brought on by ambulation:    Pain in feet at night that wakes you up from your sleep:     Blood clot in your veins:    Leg swelling:         Pulmonary    Oxygen at home:    Productive cough:     Wheezing:         Neurologic    Sudden weakness in arms or legs:     Sudden numbness in arms or legs:     Sudden onset of difficulty speaking or slurred speech:    Temporary loss of vision in one eye:     Problems with dizziness:          Gastrointestinal    Blood in stool:     Vomited blood:         Genitourinary    Burning when urinating:     Blood in urine:        Psychiatric    Major depression:         Hematologic    Bleeding problems:    Problems with blood clotting too easily:        Skin    Rashes or ulcers:        Constitutional    Fever or chills:      PHYSICAL EXAM: Vitals:   08/17/22 1343  BP: (!) 135/93  Pulse: (!) 104  Resp: 16  Temp: 98.4 F (36.9 C)  TempSrc: Temporal  SpO2: 98%  Weight: 224 lb (101.6 kg)  Height: 5\' 3"  (1.6 m)    GENERAL: The patient is a well-nourished female, in no acute distress. The vital signs are documented above. CARDIAC: There is a regular rate and rhythm.  VASCULAR:  Left radial and brachial pulse palpable Left hand warm and appears well-perfused No significant left arm swelling PULMONARY: No respiratory distress. MUSCULOSKELETAL: There are no major deformities or cyanosis. NEUROLOGIC: No focal weakness or paresthesias are detected. PSYCHIATRIC: The patient has a normal affect.  DATA:   CLINICAL DATA:  Left brachial vein DVT 08/06/2022   EXAM: LEFT UPPER EXTREMITY VENOUS DOPPLER ULTRASOUND   TECHNIQUE: Gray-scale sonography with graded compression, as well as color Doppler and duplex ultrasound were performed to evaluate the upper extremity deep venous system from the level of the subclavian vein and including the jugular, axillary, basilic, radial, ulnar and upper cephalic vein. Spectral Doppler was utilized to evaluate flow at rest and with distal augmentation maneuvers.   COMPARISON:  Report only from 08/06/2022 from [facility]   FINDINGS: Contralateral Subclavian Vein: Respiratory phasicity is normal and symmetric with the symptomatic side. No evidence of thrombus. Normal compressibility.   Internal Jugular Vein: No evidence of thrombus. Normal compressibility, respiratory phasicity and response to augmentation.   Subclavian Vein: No  evidence of thrombus. Normal compressibility, respiratory phasicity and response to augmentation.   Axillary Vein: No evidence of thrombus. Normal compressibility, respiratory phasicity and response to augmentation.   Cephalic Vein: No evidence of thrombus. Normal compressibility, respiratory phasicity and response to augmentation.  Basilic Vein: No evidence of thrombus. Normal compressibility, respiratory phasicity and response to augmentation.   Brachial Veins: Left brachial vein in the upper arm is small with some residual nonocclusive hypoechoic thrombus. Seven limited assessment because of the small vessel caliber. Color flow is detectable with flow augmentation present. No propagation of additional left upper extremity thrombus.   Radial Veins: No evidence of thrombus. Normal compressibility, respiratory phasicity and response to augmentation.   Ulnar Veins: No evidence of thrombus. Normal compressibility, respiratory phasicity and response to augmentation.   IMPRESSION: 1. Small amount of residual nonocclusive left brachial vein DVT in the upper arm. 2. No additional left upper extremity DVT.  Assessment/Plan:  52 year old female that was admitted to [facility] at the end of December that developed a DVT in her left arm in the brachial vein at the site of IV during her hospital admission.  Her IV has since been removed.  She was hospitalized for IV antibiotics for UTI.  I discussed this is her first DVT that appears provoked as it was associated with an IV site.  I have recommended 3 months of anticoagulation.  She has already been prescribed Eliquis at discharge but was only given a 1 month supply with no direction on course or length of therapy.  I refilled her for another 2 months of 5 mg twice a day.  Discussed after 3 months of therapy I think she can stop the Eliquis.  Does not need lifelong anticoagulation given a first-time provoked event.  Discussed there is no indication  for surgical intervention on isolated brachial vein DVT.  She can follow-up with me as needed.  Concerned about some numbness in her left arm and I discussed she has a good radial and brachial pulse on exam and the hand appears well-perfused.   Cephus Shelling, MD [facility] Office: [phone]

## 2022-08-17 NOTE — Telephone Encounter (Signed)
Dr. Hilarie Fredrickson,  Patient called to follow up on transfer.

## 2022-08-19 NOTE — Telephone Encounter (Signed)
Request received to transfer GI care from outside practice to [facility].  We appreciate the interest in our practice, however at this time due to high demand from patients without established GI providers we cannot accommodate this transfer at this time. She has been very recently seen with evaluation and treatment recommendations made at [facility] in Nov 2023

## 2022-08-20 ENCOUNTER — Telehealth: Payer: Self-pay

## 2022-08-20 NOTE — Telephone Encounter (Signed)
Pt called requesting a refill on pain med.  Reviewed pt's chart, returned call for clarification, two identifiers used. Informed her that Dr. Carlis Abbott would not refill pain med. Instructed her to use OTC meds for pain. Confirmed understanding.

## 2022-08-24 NOTE — Telephone Encounter (Signed)
Patient called to follow up on request, she was advised of the providers recommendations below.

## 2022-08-31 ENCOUNTER — Emergency Department (HOSPITAL_BASED_OUTPATIENT_CLINIC_OR_DEPARTMENT_OTHER)
Admission: EM | Admit: 2022-08-31 | Discharge: 2022-08-31 | Disposition: A | Discharge status: Home / Self Care | Payer: 59 | Attending: Emergency Medicine | Admitting: Emergency Medicine

## 2022-08-31 ENCOUNTER — Encounter (HOSPITAL_BASED_OUTPATIENT_CLINIC_OR_DEPARTMENT_OTHER): Payer: Self-pay

## 2022-08-31 ENCOUNTER — Other Ambulatory Visit: Payer: Self-pay

## 2022-08-31 DIAGNOSIS — R1111 Vomiting without nausea: Secondary | ICD-10-CM | POA: Insufficient documentation

## 2022-08-31 LAB — CBC
HCT: 41.9 % (ref 36.0–46.0)
Hemoglobin: 14.2 g/dL (ref 12.0–15.0)
MCH: 28.6 pg (ref 26.0–34.0)
MCHC: 33.9 g/dL (ref 30.0–36.0)
MCV: 84.5 fL (ref 80.0–100.0)
Platelets: 308 10*3/uL (ref 150–400)
RBC: 4.96 MIL/uL (ref 3.87–5.11)
RDW: 14.6 % (ref 11.5–15.5)
WBC: 11.9 10*3/uL — ABNORMAL HIGH (ref 4.0–10.5)
nRBC: 0 % (ref 0.0–0.2)

## 2022-08-31 LAB — URINALYSIS, ROUTINE W REFLEX MICROSCOPIC
Glucose, UA: NEGATIVE mg/dL
Ketones, ur: NEGATIVE mg/dL
Leukocytes,Ua: NEGATIVE
Nitrite: NEGATIVE
Protein, ur: 100 mg/dL — AB
Specific Gravity, Urine: 1.025 (ref 1.005–1.030)
pH: 5.5 (ref 5.0–8.0)

## 2022-08-31 LAB — URINALYSIS, MICROSCOPIC (REFLEX)

## 2022-08-31 LAB — COMPREHENSIVE METABOLIC PANEL
ALT: 17 U/L (ref 0–44)
AST: 25 U/L (ref 15–41)
Albumin: 3.3 g/dL — ABNORMAL LOW (ref 3.5–5.0)
Alkaline Phosphatase: 59 U/L (ref 38–126)
Anion gap: 8 (ref 5–15)
BUN: 14 mg/dL (ref 6–20)
CO2: 24 mmol/L (ref 22–32)
Calcium: 8.9 mg/dL (ref 8.9–10.3)
Chloride: 105 mmol/L (ref 98–111)
Creatinine, Ser: 0.77 mg/dL (ref 0.44–1.00)
GFR, Estimated: >60 mL/min (ref >60)
Glucose, Bld: 101 mg/dL — ABNORMAL HIGH (ref 70–99)
Potassium: 3.5 mmol/L (ref 3.5–5.1)
Sodium: 137 mmol/L (ref 135–145)
Total Bilirubin: 0.3 mg/dL (ref 0.3–1.2)
Total Protein: 8 g/dL (ref 6.5–8.1)

## 2022-08-31 LAB — LIPASE, BLOOD: Lipase: 33 U/L (ref 11–51)

## 2022-08-31 MED ORDER — ONDANSETRON HCL 4 MG/2ML IJ SOLN: 4.0000 mg | Freq: Once | INTRAMUSCULAR | Status: DC

## 2022-08-31 MED ORDER — ONDANSETRON 4 MG PO TBDP: 4.0000 mg | ORAL_TABLET | Freq: Three times a day (TID) | ORAL | 0 refills | Status: DC | PRN

## 2022-08-31 MED ORDER — ONDANSETRON 4 MG PO TBDP
4.0000 mg | ORAL_TABLET | Freq: Once | ORAL | Status: DC
  Filled 2022-08-31: qty 1

## 2022-08-31 MED ORDER — FLUCONAZOLE 200 MG PO TABS: 200.0000 mg | ORAL_TABLET | Freq: Every day | ORAL | 0 refills | Status: AC

## 2022-08-31 MED ORDER — LACTATED RINGERS IV BOLUS: 1000.0000 mL | Freq: Once | INTRAVENOUS | Status: DC

## 2022-08-31 NOTE — ED Notes (Addendum)
Unable to start IV. 4 attempts made, including [place]. Provider aware.

## 2022-08-31 NOTE — Discharge Instructions (Signed)
Your workup today is overall been reassuring.  No concerning cause of your symptoms.  We did discuss given the symptoms are similar to when you have had esophageal candidiasis that we can empirically treat you with fluconazole.  You stated you did not want to be treated unless you have this diagnosis confirmed.  Unfortunately we do not have endoscopy available to be able to diagnose this definitively tonight.  I have however sent this medication into the pharmacy for you in case you would like to take this while you wait for follow-up with gastroenterology or you discuss with them and they would like for you to start this medication until you get seen in the clinic.  I have given you a referral to a new gastroenterologist that she can follow-up with.  Also sent in Zofran.  For any concerning symptoms please return to the emergency department otherwise please call and establish care with a new gastroenterologist.  In the meantime please use the services of your current gastroenterologist.

## 2022-08-31 NOTE — ED Provider Notes (Signed)
[facility] [facility] Provider Note   CSN: [id number] Arrival date & time: 08/31/22  1717     History  Chief Complaint  Patient presents with   Emesis    Casey [name] is a 52 y.o. female.  Casey [name] is a 52 year old female who presents today for evaluation of vomiting and inability to keep down p.o. intake.  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  She denies any nausea, but reports spontaneous emesis.  Denies abdominal pain.  States this has happened in the past, and feels similar to when she has been diagnosed with esophageal candidiasis.  Typically she takes fluconazole for this.  She denies dysuria, flank pain, shortness of breath.  Denies melanotic stools.  Follows with gastroenterology at [facility].  States she is in the process of switching to a another gastroenterologist that she is not happy with the service that she has received during her recent admission from [facility] gastroenterology.  The history is provided by the patient. No language interpreter was used.       Home Medications Prior to Admission medications   Medication Sig Start Date End Date Taking? Authorizing Provider  apixaban (ELIQUIS) 5 MG TABS tablet Take 1 tablet (5 mg total) by mouth 2 (two) times daily. 08/17/22 11/15/22  Marty Heck, MD  b complex vitamins capsule Take 1 capsule by mouth daily.    [provider]  B Complex-C (B-COMPLEX WITH VITAMIN C) tablet Take 1 tablet by mouth daily. 09/06/19   [provider]  oxyCODONE (OXY IR/ROXICODONE) 5 MG immediate release tablet Take 1 tablet (5 mg total) by mouth every 4 (four) hours as needed for severe pain. 08/12/22   Isla Pence, MD      Allergies    Advair hfa [fluticasone-salmeterol], Asa [aspirin], Cleocin [clindamycin], Clindamycin/lincomycin, Fludrocortisone acetate, Gabapentin, Hydrochlorothiazide, Imipramine,  Ipratropium bromide, Medroxyprogesterone, Meloxicam, Metformin and related, Nystatin, Penicillins, Pred forte [prednisolone], Prednisone, Proventil hfa [albuterol], Solu-medrol [methylprednisolone], Sulfa antibiotics, Tegaderm ag mesh [silver], Toradol [ketorolac tromethamine], and Tramadol    Review of Systems   Review of Systems  Constitutional:  Negative for fever.  Respiratory:  Negative for shortness of breath.   Cardiovascular:  Negative for chest pain.  Gastrointestinal:  Positive for vomiting. Negative for abdominal pain, blood in stool and nausea.  Genitourinary:  Negative for dysuria and flank pain.  Neurological:  Negative for light-headedness.  All other systems reviewed and are negative.   Physical Exam Updated Vital Signs BP 134/81 (BP Location: Right Arm)   Pulse 89   Temp 98.6 F (37 C) (Oral)   Resp 20   Ht 5\' 3"  (1.6 m)   Wt 104.3 kg   SpO2 97%   BMI 40.74 kg/m  Physical Exam Vitals and nursing note reviewed.  Constitutional:      General: She is not in acute distress.    Appearance: Normal appearance. She is not ill-appearing.  HENT:     Head: Normocephalic and atraumatic.     Nose: Nose normal.  Eyes:     General: No scleral icterus.    Extraocular Movements: Extraocular movements intact.     Conjunctiva/sclera: Conjunctivae normal.  Cardiovascular:     Rate and Rhythm: Normal rate and regular rhythm.     Pulses: Normal pulses.  Pulmonary:     Effort: Pulmonary effort is normal. No respiratory distress.     Breath sounds:  Normal breath sounds. No wheezing or rales.  Abdominal:     General: There is no distension.     Palpations: Abdomen is soft.     Tenderness: There is no abdominal tenderness.  Musculoskeletal:        General: Normal range of motion.     Cervical back: Normal range of motion.     Right lower leg: No edema.     Left lower leg: No edema.  Skin:    General: Skin is warm and dry.  Neurological:     General: No focal deficit  present.     Mental Status: She is alert and oriented to person, place, and time. Mental status is at baseline.     ED Results / Procedures / Treatments   Labs (all labs ordered are listed, but only abnormal results are displayed) Labs Reviewed  COMPREHENSIVE METABOLIC PANEL - Abnormal; Notable for the following components:      Result Value   Glucose, Bld 101 (*)    Albumin 3.3 (*)    All other components within normal limits  CBC - Abnormal; Notable for the following components:   WBC 11.9 (*)    All other components within normal limits  URINALYSIS, ROUTINE W REFLEX MICROSCOPIC - Abnormal; Notable for the following components:   APPearance CLOUDY (*)    Hgb urine dipstick LARGE (*)    Bilirubin Urine SMALL (*)    Protein, ur 100 (*)    All other components within normal limits  URINALYSIS, MICROSCOPIC (REFLEX) - Abnormal; Notable for the following components:   Bacteria, UA FEW (*)    All other components within normal limits  LIPASE, BLOOD    EKG None  Radiology No results found.  Procedures Procedures    Medications Ordered in ED Medications  lactated ringers bolus 1,000 mL (has no administration in time range)  ondansetron (ZOFRAN) injection 4 mg (has no administration in time range)    ED Course/ Medical Decision Making/ A&P                             Medical Decision Making Amount and/or Complexity of Data Reviewed Labs: ordered.  Risk Prescription drug management.   52 year old female presents today for evaluation of vomiting, and inability to tolerate p.o. intake.  She is concerned for dehydration.  No episodes of emesis throughout her stay in the emergency department which was for 5-1/2 hours.  Labs obtained which showed mild leukocytosis at 11.9 which is likely reactive.  Hemoglobin is stable.  CMP shows preserved renal function, normal BUN.  Normal electrolytes.  UA without evidence of UTI.  Lipase within normal limits.  Multiple attempts made at  obtaining an IV by nursing staff including with ultrasound guidance without success.  Given patient's hemodynamic stability, well appearance.  Feel she is appropriate for discharge and outpatient follow-up.  Will provide gastroenterology referral to establish with a local gastroenterologist.  She has been ambulating within the emergency department without difficulty.  Blood pressure is within normal.  Without tachycardia.  Discussed given her symptoms are similar and she has had multiple repeated episodes of esophageal candidiasis that I can prescribe her fluconazole.  She states she would like to have an EGD done before she can take this medication.  I stated that we do not have this service available tonight and she will need to follow-up with gastroenterology for this.  I will prescribe her this and she  can decide after speak with gastroenterology if you would like to take this.  I discussed with her to reach out to her current gastroenterologist until she gets established with a new one.  Will also give her Zofran.  Patient is in agreement with this plan.  Strict return precautions discussed.  Patient voices understanding and is in agreement with plan.  Patient is stable for discharge.  Plan discussed with attending who is in agreement.  Final Clinical Impression(s) / ED Diagnoses Final diagnoses:  Vomiting without nausea, unspecified vomiting type    Rx / DC Orders ED Discharge Orders          Ordered    fluconazole (DIFLUCAN) 200 MG tablet  Daily        08/31/22 2311    ondansetron (ZOFRAN-ODT) 4 MG disintegrating tablet  Every 8 hours PRN        08/31/22 2311              Evlyn Courier, PA-C 08/31/22 2311    Lennice Sites, DO 08/31/22 2321

## 2022-08-31 NOTE — ED Triage Notes (Signed)
Pt reports nausea and vomiting x 2 days. She also reports she thinks she has candida in her esophagus because she has had it 4 times in the last year and it feels similar. She also reports she was diagnosed with a DVT in her left arm last month and she is on Eliquis.

## 2022-09-20 ENCOUNTER — Emergency Department (HOSPITAL_COMMUNITY)
Admission: EM | Admit: 2022-09-20 | Discharge: 2022-09-20 | Disposition: A | Discharge status: Home / Self Care | Payer: 59 | Attending: Student | Admitting: Student

## 2022-09-20 ENCOUNTER — Encounter (HOSPITAL_COMMUNITY): Payer: Self-pay

## 2022-09-20 ENCOUNTER — Other Ambulatory Visit: Payer: Self-pay

## 2022-09-20 DIAGNOSIS — J029 Acute pharyngitis, unspecified: Secondary | ICD-10-CM | POA: Insufficient documentation

## 2022-09-20 LAB — GROUP A STREP BY PCR: Group A Strep by PCR: NOT DETECTED

## 2022-09-20 NOTE — ED Triage Notes (Signed)
Pt here from home d/t sore throat that started 1 day ago. Denied fever. A&O X4.

## 2022-09-20 NOTE — Discharge Instructions (Addendum)
You came to the emergency department today due to a sore throat and feeling in his flow there was some constriction in your esophagus.  Your strep test is negative.  You do have a small area in the back of your throat that may be consistent with candidiasis.  You did not want to try any of the shots or lidocaine/'s at this time.  We discussed her allergy to nystatin.  It is reasonable for you to follow-up with GI outpatient.  I have placed a referral and you should be hearing from them within the next 72 hours.  If you do not, the number to call is attached to these papers.  It was a pleasure to meet you and we hope you feel better!

## 2022-09-20 NOTE — ED Provider Notes (Signed)
[facility] Provider Note   CSN: [id number] Arrival date & time: 09/20/22  1145     History  Chief Complaint  Patient presents with   Casey [name] is a 52 y.o. female with a past medical history of gastric sleeve presenting today with a sore throat.  She reports that a week ago she started to notice white patches in her throat and mouth.  She has a history of recurrent Candida esophagitis.  She says that it felt the same way so she went to urgent care and got fluconazole.  This helped to the lesions in her mouth but she says that she feels like there is still continued buildup in her throat.  Says that it also feels like her throat is constricting and she is having pain with swallowing.   Sore Throat       Home Medications Prior to Admission medications   Medication Sig Start Date End Date Taking? Authorizing Provider  apixaban (ELIQUIS) 5 MG TABS tablet Take 1 tablet (5 mg total) by mouth 2 (two) times daily. 08/17/22 11/15/22  Marty Heck, MD  b complex vitamins capsule Take 1 capsule by mouth daily.    [provider]  B Complex-C (B-COMPLEX WITH VITAMIN C) tablet Take 1 tablet by mouth daily. 09/06/19   [provider]  ondansetron (ZOFRAN-ODT) 4 MG disintegrating tablet Take 1 tablet (4 mg total) by mouth every 8 (eight) hours as needed. 08/31/22   Evlyn Courier, PA-C  oxyCODONE (OXY IR/ROXICODONE) 5 MG immediate release tablet Take 1 tablet (5 mg total) by mouth every 4 (four) hours as needed for severe pain. 08/12/22   Isla Pence, MD      Allergies    Advair hfa [fluticasone-salmeterol], Asa [aspirin], Cleocin [clindamycin], Clindamycin/lincomycin, Fludrocortisone acetate, Gabapentin, Hydrochlorothiazide, Imipramine, Ipratropium bromide, Medroxyprogesterone, Meloxicam, Metformin and related, Nystatin, Penicillins, Pred forte [prednisolone], Prednisone, Proventil hfa [albuterol], Solu-medrol  [methylprednisolone], Sulfa antibiotics, Tegaderm ag mesh [silver], Toradol [ketorolac tromethamine], and Tramadol    Review of Systems   Review of Systems  Physical Exam Updated Vital Signs BP (!) 139/104   Pulse 86   Temp 98.4 F (36.9 C) (Oral)   Resp 18   SpO2 96%  Physical Exam Vitals and nursing note reviewed.  Constitutional:      Appearance: Normal appearance.  HENT:     Head: Normocephalic and atraumatic.     Mouth/Throat:     Mouth: Mucous membranes are moist.     Dentition: No dental tenderness or dental caries.     Pharynx: Oropharyngeal exudate present. No posterior oropharyngeal erythema or uvula swelling.     Tonsils: No tonsillar exudate or tonsillar abscesses.     Comments: Airway completely clear, tolerating secretions.  She does have some exudate just posterior to left tonsillar region.  No ulcerations.  No foreign body or signs of PTA/RPA.   Eyes:     General: No scleral icterus.    Conjunctiva/sclera: Conjunctivae normal.  Cardiovascular:     Rate and Rhythm: Normal rate and regular rhythm.  Pulmonary:     Effort: Pulmonary effort is normal. No respiratory distress.     Breath sounds: No wheezing.  Skin:    Findings: No rash.  Neurological:     Mental Status: She is alert.  Psychiatric:        Mood and Affect: Mood normal.     ED Results / Procedures / Treatments  Labs (all labs ordered are listed, but only abnormal results are displayed) Labs Reviewed - No data to display  EKG None  Radiology No results found.  Procedures Procedures   Medications Ordered in ED Medications - No data to display  ED Course/ Medical Decision Making/ A&P                             Medical Decision Making  52 year old female presenting today with concern for sore throat.  She reports that she has an extensive history of candidal esophagitis.  She was given fluconazole by urgent care which helped her.  She says that unfortunately she is still having  discomfort in her throat.  Thought that her breathing was tight but she says that that has resolved.  She also tells me that she has an extensive GI history.  This is inclusive of gastric sleeve.  She is currently having difficulty getting in with a gastroenterologist and says that she is due for an endoscopy.  I have placed an ambulatory referral.  Testing: Strep negative  MDM/disposition: 52 year old female presenting today with a sore throat.  She says it feels like her previous Candida infections.  She did have some exudate so strep swab was performed which was negative.  No convincing Candida in the oropharynx.  She says it was improved when she was given the Diflucan prior to her visit today.  She is requesting an endoscopy however is understanding that we do not do that from the emergency department.  She has been having difficulty getting in with GI so I have placed a referral to [facility].  She was instructed to call the number if she does not hear from them within the next couple of days.  Airway clear, tolerating secretions.  No signs of RPA/PTA and is stable for discharge  Final Clinical Impression(s) / ED Diagnoses Final diagnoses:  Pharyngitis, unspecified etiology    Rx / DC Orders ED Discharge Orders          Ordered    Ambulatory referral to Gastroenterology        09/20/22 1420    Ambulatory referral to Gastroenterology        09/20/22 1421           Results and diagnoses were explained to the patient. Return precautions discussed in full. Patient had no additional questions and expressed complete understanding.   This chart was dictated using voice recognition software.  Despite best efforts to proofread,  errors can occur which can change the documentation meaning.    Darliss Ridgel 09/20/22 1719    Teressa Lower, MD 09/20/22 1843

## 2022-09-27 DIAGNOSIS — R112 Nausea with vomiting, unspecified: Secondary | ICD-10-CM | POA: Insufficient documentation

## 2022-09-27 HISTORY — DX: Nausea with vomiting, unspecified: R11.2

## 2022-10-01 ENCOUNTER — Emergency Department (HOSPITAL_COMMUNITY)
Admission: EM | Admit: 2022-10-01 | Discharge: 2022-10-01 | Disposition: A | Discharge status: Home / Self Care | Payer: 59 | Attending: Emergency Medicine | Admitting: Emergency Medicine

## 2022-10-01 ENCOUNTER — Encounter (HOSPITAL_COMMUNITY): Payer: Self-pay

## 2022-10-01 ENCOUNTER — Other Ambulatory Visit: Payer: Self-pay

## 2022-10-01 DIAGNOSIS — I808 Phlebitis and thrombophlebitis of other sites: Secondary | ICD-10-CM | POA: Diagnosis not present

## 2022-10-01 DIAGNOSIS — Z7901 Long term (current) use of anticoagulants: Secondary | ICD-10-CM | POA: Diagnosis not present

## 2022-10-01 DIAGNOSIS — R2231 Localized swelling, mass and lump, right upper limb: Secondary | ICD-10-CM | POA: Diagnosis present

## 2022-10-01 NOTE — ED Triage Notes (Signed)
Pt requesting eval for right arm "knot" to see if it is a blood clot.  Hx of same on the left arm.  Has been compliant with her eliquis.  Pt reports having attempted IV access on the right arm earlier in the week d/t "stomach issues" and has significant bruising noted.  Pt states right arm is painful, visible hematoma present.

## 2022-10-01 NOTE — ED Provider Notes (Signed)
[facility] [facility] Provider Note   CSN: [id number] Arrival date & time: 10/01/22  1906     History  Chief Complaint  Patient presents with   Arm Pain    Casey [name] is a 52 y.o. female.  52 yo F with a chief complaint of a nodular area to her right arm.  She is worried that maybe it is a DVT.  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  She is on Eliquis and states that she is mostly compliant with her medication but sometimes she forgets to take it in the evening.  She was recently in the emergency department setting at [facility] send had blood drawn and she thinks that this may be the cause of her symptoms.  She has a secondary complaint that her blood pressure has been elevated recently.  It was elevated at her last ER visit elevated here.  She is not on medications.  Remote history of high blood pressure before her gastric sleeve.   Arm Pain       Home Medications Prior to Admission medications   Medication Sig Start Date End Date Taking? Authorizing Provider  apixaban (ELIQUIS) 5 MG TABS tablet Take 1 tablet (5 mg total) by mouth 2 (two) times daily. 08/17/22 11/15/22  Marty Heck, MD  b complex vitamins capsule Take 1 capsule by mouth daily.    [provider]  B Complex-C (B-COMPLEX WITH VITAMIN C) tablet Take 1 tablet by mouth daily. 09/06/19   [provider]  ondansetron (ZOFRAN-ODT) 4 MG disintegrating tablet Take 1 tablet (4 mg total) by mouth every 8 (eight) hours as needed. 08/31/22   Evlyn Courier, PA-C  oxyCODONE (OXY IR/ROXICODONE) 5 MG immediate release tablet Take 1 tablet (5 mg total) by mouth every 4 (four) hours as needed for severe pain. 08/12/22   Isla Pence, MD      Allergies    Advair hfa [fluticasone-salmeterol], Asa [aspirin], Cleocin [clindamycin], Clindamycin/lincomycin, Fludrocortisone acetate, Gabapentin, Hydrochlorothiazide, Imipramine,  Ipratropium bromide, Medroxyprogesterone, Meloxicam, Metformin and related, Nystatin, Penicillins, Pred forte [prednisolone], Prednisone, Proventil hfa [albuterol], Solu-medrol [methylprednisolone], Sulfa antibiotics, Tegaderm ag mesh [silver], Toradol [ketorolac tromethamine], and Tramadol    Review of Systems   Review of Systems  Physical Exam Updated Vital Signs BP (!) 164/115 (BP Location: Left Arm)   Pulse (!) 43   Temp 97.6 F (36.4 C) (Oral)   Resp 18   Ht '5\' 3"'$  (1.6 m)   Wt 105 kg   SpO2 100%   BMI 41.01 kg/m  Physical Exam Vitals and nursing note reviewed.  Constitutional:      General: She is not in acute distress.    Appearance: She is well-developed. She is not diaphoretic.  HENT:     Head: Normocephalic and atraumatic.  Eyes:     Pupils: Pupils are equal, round, and reactive to light.  Cardiovascular:     Rate and Rhythm: Normal rate and regular rhythm.     Heart sounds: No murmur heard.    No friction rub. No gallop.  Pulmonary:     Effort: Pulmonary effort is normal.     Breath sounds: No wheezing or rales.  Abdominal:     General: There is no distension.     Palpations: Abdomen is soft.     Tenderness: There is no abdominal tenderness.  Musculoskeletal:        General:  No tenderness.     Cervical back: Normal range of motion and neck supple.     Comments: Bruising noted to the right upper extremity.  Some nodularity about the [facility] on the medial aspect.  Skin:    General: Skin is warm and dry.  Neurological:     Mental Status: She is alert and oriented to person, place, and time.  Psychiatric:        Behavior: Behavior normal.     ED Results / Procedures / Treatments   Labs (all labs ordered are listed, but only abnormal results are displayed) Labs Reviewed - No data to display  EKG None  Radiology No results found.  Procedures Procedures    Medications Ordered in ED Medications - No data to display  ED Course/ Medical Decision Making/  A&P                             Medical Decision Making  52 yo F with a chief complaints of concern for DVT.  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  She thinks her arm feels the same.  She does have some mild nodularity, no obvious arm edema.  She does have multiple hematomas.  I think she is low risk for having a recurrent DVT.  She is already on Eliquis.  She stated that she is not totally compliant.  Will arrange for a DVT ultrasound to be performed.  I think if she has not been fully compliant with her Eliquis and she is not a treatment failure if she does have a DVT.  Likely would be continued on Eliquis with vascular follow-up.  She has a secondary complaint of hypertension.  She has no concerning symptoms otherwise.  Her blood pressure on repeat check here was 140/99.  Encouraged her to keep track of her symptoms send follow-up with her PCP in the office.  Patient asymptomatic with no noted s/s of end organ damage.  No chest pain, diaphoresis, nausea or other acs symptoms.  No headache or neurologic complaints,  no unequal pulses, normal pulse ox without rales or sob.  Feel this is unlikely to be a Hypertensive Emergency and recent studies suggest no benefit for inpatient admission.  There are also no studies to my knowledge suggesting that patients with hypertensive urgency have increased risk for end organ disease. In fact there has been a study recently that would suggest that the rapid change can induce harm.  The [employer] policy statement on asymptomatic hypertension does not  recommend routing ED medical intervention. The patient will follow up closely with their PCP.  Compliance with their medication stressed.    Lowell Guitar, Cicero Duck EH, et al. Characteristics and outcomes of patients presenting with hypertensive urgency in the office setting. JAMA Intern Med. 2016 Jul 1; 176(7): 981-8.   Cerebrovascular risks with rapid blood  pressure lowering in the absence of hypertensive emergency Narda Bonds, Wende Crease, et al. Am J Emerg Med. 2019;37(6):1073-1077.  8:03 PM:  I have discussed the diagnosis/risks/treatment options with the patient.  Evaluation and diagnostic testing in the emergency department does not suggest an emergent condition requiring admission or immediate intervention beyond what has been performed at this time.  They will follow up with PCP. We also discussed returning to the ED immediately if new or worsening sx occur. We discussed the sx which are most concerning (e.g.,  sudden worsening pain, fever, inability to tolerate by mouth) that necessitate immediate return. Medications administered to the patient during their visit and any new prescriptions provided to the patient are listed below.  Medications given during this visit Medications - No data to display   The patient appears reasonably screen and/or stabilized for discharge and I doubt any other medical condition or other [facility] requiring further screening, evaluation, or treatment in the ED at this time prior to discharge.          Final Clinical Impression(s) / ED Diagnoses Final diagnoses:  Thrombophlebitis arm    Rx / DC Orders ED Discharge Orders          Ordered    UE VENOUS DUPLEX        10/01/22 [place], [place], DO 10/01/22 2003

## 2022-10-01 NOTE — Discharge Instructions (Signed)
Please return for vascular ultrasound.  Please follow-up with your family doctor in the office and discuss with them about your high blood pressure.  Please return for difficulty breathing chest pain one-sided numbness or weakness or difficulty speech or swallowing.

## 2022-10-02 ENCOUNTER — Emergency Department (HOSPITAL_BASED_OUTPATIENT_CLINIC_OR_DEPARTMENT_OTHER)
Admission: RE | Admit: 2022-10-02 | Discharge: 2022-10-02 | Disposition: A | Discharge status: Home / Self Care | Payer: 59 | Source: Ambulatory Visit | Attending: Emergency Medicine | Admitting: Emergency Medicine

## 2022-10-02 DIAGNOSIS — R52 Pain, unspecified: Secondary | ICD-10-CM | POA: Diagnosis not present

## 2022-10-02 NOTE — Progress Notes (Signed)
VASCULAR LAB    Right upper extremity venous duplex has been performed.  See CV proc for preliminary results.   Jamecia Lerman, RVT 10/02/2022, 11:57 AM

## 2022-10-05 ENCOUNTER — Emergency Department (HOSPITAL_COMMUNITY): Payer: 59

## 2022-10-05 ENCOUNTER — Other Ambulatory Visit: Payer: Self-pay

## 2022-10-05 ENCOUNTER — Encounter (HOSPITAL_COMMUNITY): Payer: Self-pay

## 2022-10-05 ENCOUNTER — Emergency Department (HOSPITAL_COMMUNITY)
Admission: EM | Admit: 2022-10-05 | Discharge: 2022-10-05 | Disposition: A | Discharge status: Home / Self Care | Payer: 59 | Attending: Emergency Medicine | Admitting: Emergency Medicine

## 2022-10-05 DIAGNOSIS — Z7901 Long term (current) use of anticoagulants: Secondary | ICD-10-CM | POA: Insufficient documentation

## 2022-10-05 DIAGNOSIS — E119 Type 2 diabetes mellitus without complications: Secondary | ICD-10-CM | POA: Insufficient documentation

## 2022-10-05 DIAGNOSIS — R0789 Other chest pain: Secondary | ICD-10-CM | POA: Diagnosis not present

## 2022-10-05 DIAGNOSIS — R079 Chest pain, unspecified: Secondary | ICD-10-CM | POA: Diagnosis present

## 2022-10-05 DIAGNOSIS — J45909 Unspecified asthma, uncomplicated: Secondary | ICD-10-CM | POA: Insufficient documentation

## 2022-10-05 DIAGNOSIS — M7981 Nontraumatic hematoma of soft tissue: Secondary | ICD-10-CM | POA: Insufficient documentation

## 2022-10-05 LAB — CBC
HCT: 47.6 % — ABNORMAL HIGH (ref 36.0–46.0)
Hemoglobin: 15.7 g/dL — ABNORMAL HIGH (ref 12.0–15.0)
MCH: 28.8 pg (ref 26.0–34.0)
MCHC: 33 g/dL (ref 30.0–36.0)
MCV: 87.3 fL (ref 80.0–100.0)
Platelets: 332 10*3/uL (ref 150–400)
RBC: 5.45 MIL/uL — ABNORMAL HIGH (ref 3.87–5.11)
RDW: 14.6 % (ref 11.5–15.5)
WBC: 12.8 10*3/uL — ABNORMAL HIGH (ref 4.0–10.5)
nRBC: 0 % (ref 0.0–0.2)

## 2022-10-05 LAB — I-STAT BETA HCG BLOOD, ED (MC, WL, AP ONLY): I-stat hCG, quantitative: <5 m[IU]/mL (ref <5)

## 2022-10-05 LAB — BASIC METABOLIC PANEL
Anion gap: 11 (ref 5–15)
BUN: 13 mg/dL (ref 6–20)
CO2: 24 mmol/L (ref 22–32)
Calcium: 9.4 mg/dL (ref 8.9–10.3)
Chloride: 103 mmol/L (ref 98–111)
Creatinine, Ser: 0.99 mg/dL (ref 0.44–1.00)
GFR, Estimated: >60 mL/min (ref >60)
Glucose, Bld: 97 mg/dL (ref 70–99)
Potassium: 3.4 mmol/L — ABNORMAL LOW (ref 3.5–5.1)
Sodium: 138 mmol/L (ref 135–145)

## 2022-10-05 LAB — TROPONIN I (HIGH SENSITIVITY)
Troponin I (High Sensitivity): 3 ng/L (ref <18)
Troponin I (High Sensitivity): 3 ng/L (ref <18)

## 2022-10-05 MED ORDER — IOHEXOL 350 MG/ML SOLN
75.0000 mL | Freq: Once | INTRAVENOUS | Status: AC | PRN
  Administered 2022-10-05: 75 mL via INTRAVENOUS

## 2022-10-05 NOTE — ED Notes (Signed)
Patient returned to room from CT. 

## 2022-10-05 NOTE — ED Triage Notes (Signed)
Pt arrived via [facility] from home for non radiating midsternal chest pain on and off since dx DVT in Dec. Dizziness, SOB and blurred vision started at 1100 today. Pt not in resp distress at this time.

## 2022-10-05 NOTE — Progress Notes (Addendum)
TOC CSW was consulted to arrange transportation for pt home. Pts husband does not drive.  Pt has no other family that could assist her with transportation home.  Pt arrived her by [facility].    [place] [phone]   Casey [name], MSW, LCSW-A Pronouns:  She/Her/Hers [facility] Health[facility]itions of Care Clinical Social Worker Direct Number:  [phone] [email or web address]

## 2022-10-05 NOTE — Discharge Instructions (Addendum)
Your scan did not show any PE.  Your caregiver has diagnosed you as having chest pain that is nonspecific for one problem. This means that after looking at you and examining you and ordering tests (such as blood work, chest x-rays and EKG), your caregiver does not believe that the problem is serious enough to need watching in the hospital. This judgment is often made after testing shows no acute heart attack and you are at low risk for sudden acute heart condition. Chest pain comes from many different causes.  Seek immediate medical attention if:  You have severe chest pain, especially if the pain is crushing or pressure-like and spreads to the arms, back, neck, or jaw, or if you have sweating, nausea, shortness of breath. This is an emergency. Don't wait to see if the pain will go away. Get medical help at once. Call 911 immediately. Do not drive herself to the hospital.  Your chest pain gets worse and does not go away with rest.  You have an attack of chest pain lasting longer than usual, despite rest and treatment with the medications your caregiver has prescribed  You awaken from sleep with chest pain or shortness of breath.  You feel faint or dizzy  You have chest pain not typical of your usual pain for which you originally saw your caregiver.  You must have a repeat evaluation within 24-48 hours for a recheck of your heart.  An internal referral for cardiology has been placed on your behalf.  You should expect a phone call by tomorrow afternoon to schedule your appointment.  If you have not received 1 by then, please call to schedule a follow-up appointment within the next 2 days.  Again should you experience any new or worsening symptoms, return to the emergency department immediately.  RESOURCE GUIDE  Dental Problems  Patients with Medicaid: [place] [employer] Phone:  [phone]                                                  Phone:  [phone]  If unable to pay or uninsured, contact:  [facility] or [facility]. to become qualified for the adult dental clinic.  Chronic Pain Problems Contact [facility]  [phone] Patients need to be referred by their primary care doctor.  Insufficient Money for Medicine Contact [employer]:  call "211" or [place] [phone].  No Primary Care Doctor Call [facility]  [phone] Other agencies that provide inexpensive medical care    [place]  F2597459    [facility]  [place]  [phone]    [facility]  [phone]    [facility]  [place]  [place]  [place]   [phone] (emergency services [phone])  Substance Abuse [facility]  [phone] [facility] [phone] [facility] [place] [phone] Chinita Pester [phone] Residential & Outpatient Substance Abuse Program  [phone]  Abuse/Neglect [place] [phone] Center [phone] (After Hours)  Emergency [place] [phone]  [place] [facility] [place] [phone] [place] [phone]  MRSA Hotline #:   508-415-4719    [facility] Dept. [address]      [place] Phone:  [phone]                                   Phone:  [phone]                  Phone:  [place] Phone:  [place] [phone] (After Hours)

## 2022-10-05 NOTE — ED Provider Notes (Signed)
I received this patient handoff from previous PA, [place].  Please see her note for original history and workup thus far.  In short patient is a 52 year old woman who presented today due to nonradiating chest pain.  She has a DVT that was diagnosed in December.  She was adamant about receiving a PE study so this is pending at this time.  Plan is for me to follow-up on this and dispo accordingly.   Physical Exam  BP (!) 130/94 (BP Location: Right Arm)   Pulse 76   Temp 98.6 F (37 C) (Oral)   Resp 20   Ht '5\' 3"'$  (1.6 m)   Wt 105 kg   SpO2 100%   BMI 41.01 kg/m   Physical Exam Vitals and nursing note reviewed.  Constitutional:      Appearance: Normal appearance.  HENT:     Head: Normocephalic and atraumatic.  Eyes:     General: No scleral icterus.    Conjunctiva/sclera: Conjunctivae normal.  Pulmonary:     Effort: Pulmonary effort is normal. No respiratory distress.  Skin:    Findings: No rash.  Neurological:     Mental Status: She is alert.  Psychiatric:        Mood and Affect: Mood normal.     Procedures  Procedures  ED Course / MDM   Clinical Course as of 10/05/22 2104  Tue Oct 05, 2022  2103 Patient and I discussed that she was on Eliquis and she likely will not have a PE.  She says that she would still like the scan.  Reports that she understands that it is somewhat expensive but that she needs to prove to her job that she is having to come to the emergency department due to her weight multiple times and that they should cover her bariatric surgery. [MR]    Clinical Course User Index [MR] Zareah Hunzeker, Cecilio Asper, PA-C   Medical Decision Making Amount and/or Complexity of Data Reviewed Labs: ordered. Radiology: ordered.  Risk Prescription drug management.     CT scan negative.  Patient discharged.       Darliss Ridgel 10/05/22 2148    Elgie Congo, MD 10/05/22 2221

## 2022-10-05 NOTE — ED Provider Notes (Signed)
Casey [name]   CSN: [id number] Arrival date & time: 10/05/22  1401     History {Add pertinent medical, surgical, social history, OB history to HPI:1} Chief Complaint  Patient presents with   Chest Pain    Casey [name] is a 52 y.o. 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  On eliquis.  Patient reports increased stress and centralized chest pain.  Worsened with palpation and movement of upper extremities.  Relieved by decreased activity of upper extremities.  Has not taken aspirin due to allergies/inability to tolerate.  Denies upper extremity swelling, warmth, or pain.  Patient reports chest pain should have 2 to 3 days, occurring at random.  At this time, patient declines recent vision changes.  Dizziness described as random and intermittent with unknown duration, though without it since arrival to the ED.  Reports feeling difficulty catching her breath when she has chest pain exclusively.  No recent URI symptoms.    Seen recently in the ED 10/01/2022 and diagnosed with thrombophlebitis.  Was recommended for outpatient ultrasound of BUE to assess for possible DVT, which resulted negative.  Patient followed up with her PCP, who recommended following up with cardiology.  Was told to come to ED if chest pain continues.  Patient states the cardiology referral is in process, but is unsure how quickly she may be able to follow-up with them.  Would like to follow-up with the cardiology office in [place].    The history is provided by the patient and medical records.  Chest Pain     Home Medications Prior to Admission medications   Medication Sig Start Date End Date Taking? Authorizing Provider  apixaban (ELIQUIS) 5 MG TABS tablet Take 1 tablet (5 mg total) by mouth 2 (two) times daily. 08/17/22 11/15/22  Marty Heck, MD   b complex vitamins capsule Take 1 capsule by mouth daily.    [provider]  B Complex-C (B-COMPLEX WITH VITAMIN C) tablet Take 1 tablet by mouth daily. 09/06/19   [provider]  ondansetron (ZOFRAN-ODT) 4 MG disintegrating tablet Take 1 tablet (4 mg total) by mouth every 8 (eight) hours as needed. 08/31/22   Evlyn Courier, PA-C  oxyCODONE (OXY IR/ROXICODONE) 5 MG immediate release tablet Take 1 tablet (5 mg total) by mouth every 4 (four) hours as needed for severe pain. 08/12/22   Isla Pence, MD      Allergies    Advair hfa [fluticasone-salmeterol], Asa [aspirin], Cleocin [clindamycin], Clindamycin/lincomycin, Fludrocortisone acetate, Gabapentin, Hydrochlorothiazide, Imipramine, Ipratropium bromide, Medroxyprogesterone, Meloxicam, Metformin and related, Nystatin, Penicillins, Pred forte [prednisolone], Prednisone, Proventil hfa [albuterol], Solu-medrol [methylprednisolone], Sulfa antibiotics, Tegaderm ag mesh [silver], Toradol [ketorolac tromethamine], and Tramadol    Review of Systems   Review of Systems  Cardiovascular:  Positive for chest pain.    Physical Exam Updated Vital Signs BP (!) 130/94 (BP Location: Right Arm)   Pulse 76   Temp 98.6 F (37 C) (Oral)   Resp 20   Ht '5\' 3"'$  (1.6 m)   Wt 105 kg   SpO2 100%   BMI 41.01 kg/m  Physical Exam Vitals and nursing [name] reviewed.  Constitutional:      General: She is not in acute distress.    Appearance: She is well-developed. She is not ill-appearing, toxic-appearing or diaphoretic.  HENT:     Head: Normocephalic and  atraumatic.     Mouth/Throat:     Mouth: Mucous membranes are moist.     Pharynx: Oropharynx is clear.  Eyes:     General: No scleral icterus.    Conjunctiva/sclera: Conjunctivae normal.  Cardiovascular:     Rate and Rhythm: Normal rate and regular rhythm.     Pulses:          Radial pulses are 2+ on the right side and 2+ on the left side.       Dorsalis pedis pulses are 2+ on the right  side and 2+ on the left side.       Posterior tibial pulses are 2+ on the right side and 2+ on the left side.     Heart sounds: Normal heart sounds. No murmur heard.    Comments: No JVD Pulmonary:     Effort: Pulmonary effort is normal. No tachypnea, accessory muscle usage or respiratory distress.     Breath sounds: Normal breath sounds. No stridor. No decreased breath sounds, wheezing or rhonchi.  Chest:     Chest wall: Tenderness present.     Comments: No rash, erythema, swelling, warmth, crepitus, edema, deformity, or mass.  Mild tenderness to sternal area and just below the clavicles. Abdominal:     Palpations: Abdomen is soft. There is no hepatomegaly.     Tenderness: There is no abdominal tenderness.  Musculoskeletal:        General: No swelling.     Cervical back: Neck supple.     Right lower leg: No tenderness. No edema.     Left lower leg: No tenderness. No edema.  Skin:    General: Skin is warm and dry.     Capillary Refill: Capillary refill takes less than 2 seconds.     Coloration: Skin is not cyanotic, jaundiced or pale.     Findings: No erythema.     Comments: Mild bruising noted to the right upper extremity.  No nodularity, tenderness, erythema, edema, or varicosities of BUE.  Neurological:     Mental Status: She is alert and oriented to person, place, and time.  Psychiatric:        Mood and Affect: Mood normal.     ED Results / Procedures / Treatments   Labs (all labs ordered are listed, but only abnormal results are displayed) Labs Reviewed  BASIC METABOLIC PANEL - Abnormal; Notable for the following components:      Result Value   Potassium 3.4 (*)    All other components within normal limits  CBC - Abnormal; Notable for the following components:   WBC 12.8 (*)    RBC 5.45 (*)    Hemoglobin 15.7 (*)    HCT 47.6 (*)    All other components within normal limits  I-STAT BETA HCG BLOOD, ED ([facility], [facility], [facility] ONLY)  TROPONIN I (HIGH SENSITIVITY)  TROPONIN I (HIGH  SENSITIVITY)    EKG EKG Interpretation  Date/Time:  Tuesday October 05 2022 14:09:08 EST Ventricular Rate:  76 PR Interval:  162 QRS Duration: 88 QT Interval:  394 QTC Calculation: 443 R Axis:   -39 Text Interpretation: Normal sinus rhythm Left axis deviation No previous ECGs available Confirmed by Lajean Saver [phone]) on 10/05/2022 2:21:06 PM  Radiology DG Chest 2 View  Result Date: 10/05/2022 CLINICAL DATA:  Chest pain, short of breath for 2 days EXAM: CHEST - 2 VIEW COMPARISON:  11/24/2021 FINDINGS: Frontal and lateral views of the chest demonstrate an unremarkable cardiac silhouette. No airspace  disease, effusion, or pneumothorax. No acute bony abnormalities. IMPRESSION: 1. No acute intrathoracic process. Electronically Signed   By: Randa Ngo M.D.   On: 10/05/2022 15:01    Procedures Procedures  {Document cardiac monitor, telemetry assessment procedure when appropriate:1}  Medications Ordered in ED Medications - No data to display  ED Course/ Medical Decision Making/ A&P   {   Click here for ABCD2, HEART and other calculatorsREFRESH [name] before signing :1}          HEART Score: 2                Medical Decision Making Amount and/or Complexity of Data Reviewed Labs: ordered. Radiology: ordered.   52 y.o. female presents to the ED for concern of Chest Pain   This involves an extensive number of treatment options, and is a complaint that carries with it a high risk of complications and morbidity.  The emergent differential diagnosis prior to evaluation includes, but is not limited to: ACS, pneumonia, pneumothorax, pulmonary embolism, pericarditis/myocarditis, GERD, PUD, musculoskeletal, costochondritis, anxiousness  This is not an exhaustive differential.   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  Notably recent ED  visits, see for details Upper Venous Study:  Patient Name:  Casey [name]  Date of Exam:   10/02/2022 Medical Rec #: [id number]     Accession #:    [id number] Date of Birth: 1971/05/31     Patient Gender: F Patient Age:   49 years Exam Location[facility]  [facility] Procedure:      VAS [place] UPPER EXTREMITY VENOUS DUPLEX Referring Phys: DAN FLOYD   ---------------------------------------------------------------------------   Indications: Pain Risk Factors: DVT Left brachial 08/16/22. Anticoagulation: Eliquis. Comparison Study: No prior right UEV on file  Performing Technologist: Sharion Dove RVS    Examination Guidelines: A complete evaluation includes B-mode imaging, spectral Doppler, color Doppler, and power Doppler as needed of all accessible portions of each vessel. Bilateral testing is considered an integral part of a complete examination. Limited examinations for reoccurring indications may be performed as noted.    Right Findings: +----------+------------+---------+-----------+----------+-------+ RIGHT     CompressiblePhasicitySpontaneousPropertiesSummary +----------+------------+---------+-----------+----------+-------+ IJV           Full       Yes       Yes                      +----------+------------+---------+-----------+----------+-------+ Subclavian               Yes       Yes                      +----------+------------+---------+-----------+----------+-------+ Axillary                 Yes       Yes                      +----------+------------+---------+-----------+----------+-------+ Brachial      Full                                          +----------+------------+---------+-----------+----------+-------+ Radial        Full                                          +----------+------------+---------+-----------+----------+-------+  Ulnar         Full                                           +----------+------------+---------+-----------+----------+-------+ Cephalic      Full                                          +----------+------------+---------+-----------+----------+-------+ Basilic       Full                                          +----------+------------+---------+-----------+----------+-------+    Left Findings: +----------+------------+---------+-----------+----------+-------+ LEFT      CompressiblePhasicitySpontaneousPropertiesSummary +----------+------------+---------+-----------+----------+-------+ Subclavian               Yes       Yes                      +----------+------------+---------+-----------+----------+-------+    Summary:   Right: No evidence of deep vein thrombosis in the upper extremity. No evidence of superficial vein thrombosis in the upper extremity.   Left: No evidence of thrombosis in the subclavian.    *See table(s) above for measurements and observations.   Diagnosing physician: Orlie Pollen Electronically signed by Orlie Pollen on 10/03/2022 at 9:32:27 AM.      Final     Lab Tests: I ordered, and personally interpreted labs.  The pertinent results include:   Troponin flat at 3 WBC 12.8, mildly elevated chronically, with elevated H&H-nonspecific may be suggestive of dehydration Potassium 3.4  Imaging Studies: I ordered imaging studies including CXR.   I independently visualized and interpreted imaging which showed no acute findings.  I agree with the radiologist interpretation.  ED Course: Presenting with chest pain over last 2-3 days.  With Hx of DVT in 07/2022 currently on Eliquis.  Also reported occasional shortness of breath and dizziness starting around 11:00 AM today, with increased feelings of anxiousness.  Seen recently in ED 10/01/2022, diagnosed with thrombophlebitis and recommended to continue Eliquis as prescribed, as patient has stated she is not totally compliant with regimen.   Then had DVT ultrasound performed on 10/02/2022, which was negative for DVT in bilateral upper extremities. Pt well-appearing on exam.  Sitting comfortably, nontoxic nonseptic appearing in NAD.  Reports significant tenderness of chest with palpation along sternum, supportive of atypical chest pain.  HEART score of 2, low risk.  Considered ASA and nitroglycerin, however do not seem appropriate at this time and patient with reported allergy to aspirin.   PERC positive due to age and prior DVT.  Well's score 1.5, low probability.  Satting at 100-99% on room air without evidence of respiratory distress.  Not tachycardic, not tachypneic, without recent imaging or clinical evidence of DVT.  CTAB.  No JVD.  No fever or decreased breath sounds.  No orthopnea or wheezing.  No hemoptysis.  No pain with deep inspiration.  Again is anticoagulated.  Chest pain initially reported as exertional, however after further discussion patient specifies exertion as upper extremity ROM.  No swelling, erythema, edema, or warmth of BUE.  Doubt SVC syndrome.  Not strongly suspicious for PE. Unlikely pneumonia, no cough, no leukocytosis,  no fevers, CXR and exam without acute findings.  Unlikely pneumothorax, no findings on CXR.   Unlikely pericarditis/myocarditis, GERD, PUD, as does not fit clinical picture.   Low suspicion of Tietze's syndrome.   Without clinical or laboratory findings of significant anemia.  In fact, labs suggestive of mild dehydration.     Physical exam with notable tenderness directly along the sternum though without warmth erythema or swelling.   Without rib tenderness or pleuritic pain, or evidence of fracture on CXR.  No recent injury.   No evidence of pleural effusion or pulmonary edema on CXR.   All extremities appear grossly neurovascularly intact.  Unlikely dissection, no pulse deficit, no tearing chest pain, no pulsatile abdominal mass, no neurologic complaints.   Patient also initially complaining of  dizziness, however after further discussion this appears chronically waxing and waning, and may be related to apparent mild dehydration.   EKG findings indicate without evidence of acute ischemic changes, abnormal intervals, or dysrhythmia.  No concerning change from prior.  Troponins flat 3.  Low suspicion for acute MI. Shared decision making with pt. Presentation provides suspicion for benign etiology such as costochondritis vs sternalis syndrome which is usually treated with anti-inflammatories, however patient does reports allergy and inability to tolerate these.  Recommend Tylenol for pain management and close follow-up with cardiology.  Patient request location close to [facility], I find this reasonable.  Patient reports satisfaction with today's encounter.  Discussed risk and benefit of proceeding with CTA PE study, with significant radiation exposure though low initial suspicion of PE considering recent US findings and without current evidence of DVT.  Pt has elected to proceed with CTA PE study, I find this reasonable.  If negative, plan to follow up with cardiology for follow up.  If positive, plan to treat accordingly.    Disposition: 1954  care of Casey [name] transferred to [place] at the end of my shift as the patient will require reassessment once labs/imaging have resulted.  Patient presentation, ED course, and plan of care discussed with review of all pertinent labs and imaging.  Please see his/her [name] for further details regarding further ED course and disposition.  Plan at time of handoff is described above.  This may be altered or completely changed at the discretion of the oncoming team pending results of further workup.  I discussed this case with my attending physician Dr. Nechama Guard, who agreed with the proposed treatment course and cosigned this [name] including patient's presenting symptoms, physical exam, and planned diagnostics and interventions.  Attending physician stated  agreement with plan or made changes to plan which were implemented.     This chart was dictated using voice recognition software.  Despite best efforts to proofread, errors can occur which can change the documentation meaning.   {Document critical care time when appropriate:1} {Document review of labs and clinical decision tools ie heart score, Chads2Vasc2 etc:1}  {Document your independent review of radiology images, and any outside records:1} {Document your discussion with family members, caretakers, and with consultants:1} {Document social determinants of health affecting pt's care:1} {Document your decision making why or why not admission, treatments were needed:1} Final Clinical Impression(s) / ED Diagnoses Final diagnoses:  Atypical chest pain  Chest wall pain    Rx / DC Orders ED Discharge Orders          Ordered    Ambulatory referral to Cardiology       Comments: If you have not heard from the Cardiology  office within the next 72 hours please call [phone].   10/05/22 1812

## 2022-10-05 NOTE — ED Notes (Signed)
Patient transported to CT 

## 2022-10-05 NOTE — ED Provider Triage Note (Signed)
Emergency Medicine Provider Triage Evaluation Note  Casey [name] , a 52 y.o. female  was evaluated in triage.  Pt complains of worsening chest pain over the last 2-3 days and HTN.  Located in the center radiates to the left, without arm/neck/shoulder involvement.  Worse with exertion.  Does not radiate to the back.  Then today began feeling dizzy and lightheaded.  No prior Hx of MI.  Saw PCP recently for similar symptoms, was recommended to follow-up with cardiology.  Denies recent fever, N/V, or URI symptoms.  Has not taken aspirin due to allergy.  Review of Systems  Positive:  Negative: See above  Physical Exam  BP (!) 123/92   Pulse 71   Temp 98.6 F (37 C) (Oral)   Resp 18   Ht '5\' 3"'$  (1.6 m)   Wt 105 kg   SpO2 98%   BMI 41.01 kg/m  Gen:   Awake, no distress   Resp:  Normal effort  MSK:   Moves extremities without difficulty  Other:  Chest nonTTP.  No cardiac murmur.  Sitting comfortably.  Not dipahoretic.  DP/PT and radial 2+ bilat.  Abdomen nonTTP.  Medical Decision Making  Medically screening exam initiated at 2:18 PM.  Appropriate orders placed.  EMORIE KNIPFER was informed that the remainder of the evaluation will be completed by another provider, this initial triage assessment does not replace that evaluation, and the importance of remaining in the ED until their evaluation is complete.     Prince Rome, PA-C [id number]

## 2022-10-07 DIAGNOSIS — K449 Diaphragmatic hernia without obstruction or gangrene: Secondary | ICD-10-CM | POA: Insufficient documentation

## 2022-10-07 DIAGNOSIS — E785 Hyperlipidemia, unspecified: Secondary | ICD-10-CM | POA: Insufficient documentation

## 2022-10-07 DIAGNOSIS — E7800 Pure hypercholesterolemia, unspecified: Secondary | ICD-10-CM | POA: Insufficient documentation

## 2022-10-07 HISTORY — DX: Pure hypercholesterolemia, unspecified: E78.00

## 2022-10-07 HISTORY — DX: Diaphragmatic hernia without obstruction or gangrene: K44.9

## 2022-10-13 ENCOUNTER — Encounter: Payer: Self-pay | Admitting: Cardiology

## 2022-10-13 ENCOUNTER — Ambulatory Visit: Payer: 59 | Attending: Cardiology | Admitting: Cardiology

## 2022-10-13 VITALS — BP 126/86 | HR 80 | Ht 63.0 in | Wt 224.0 lb

## 2022-10-13 DIAGNOSIS — I10 Essential (primary) hypertension: Secondary | ICD-10-CM

## 2022-10-13 DIAGNOSIS — R072 Precordial pain: Secondary | ICD-10-CM

## 2022-10-13 DIAGNOSIS — I82622 Acute embolism and thrombosis of deep veins of left upper extremity: Secondary | ICD-10-CM | POA: Diagnosis not present

## 2022-10-13 DIAGNOSIS — Z9884 Bariatric surgery status: Secondary | ICD-10-CM

## 2022-10-13 DIAGNOSIS — K2100 Gastro-esophageal reflux disease with esophagitis, without bleeding: Secondary | ICD-10-CM

## 2022-10-13 DIAGNOSIS — G4733 Obstructive sleep apnea (adult) (pediatric): Secondary | ICD-10-CM

## 2022-10-13 DIAGNOSIS — R0609 Other forms of dyspnea: Secondary | ICD-10-CM

## 2022-10-13 MED ORDER — METOPROLOL TARTRATE 25 MG PO TABS: 25.0000 mg | ORAL_TABLET | Freq: Two times a day (BID) | ORAL | 3 refills | Status: DC

## 2022-10-13 MED ORDER — NITROGLYCERIN 0.4 MG SL SUBL: 0.4000 mg | SUBLINGUAL_TABLET | SUBLINGUAL | 6 refills | Status: DC | PRN

## 2022-10-13 NOTE — Addendum Note (Signed)
Addended by: Jacobo Forest D on: 10/13/2022 09:49 AM   Modules accepted: Orders

## 2022-10-13 NOTE — Patient Instructions (Signed)
Medication Instructions:   START Metoprolol Tartrate '25mg'$  1 twice daily  The day Of CT Scan- TAKE: Metoprolol Tartrate '25mg'$  4 tablets 2 hours prior to CT Scan  START:  Nitroglycerin: Use nitroglycerin 1 tablet placed under the tongue at the first sign of chest pain or an angina attack. 1 tablet may be used every 5 minutes as needed, for up to 15 minutes. Do not take more than 3 tablets in 15 minutes. If pain persist call 911 or go to the nearest ED.    *If you need a refill on your cardiac medications before your next appointment, please call your pharmacy*   Lab Work: Your physician recommends that you return for lab work in: 1 week prior to [place] need to have labs done when you are fasting.  You can come Monday through Friday 8:30 am to 12:00 pm and 1:15 to 4:30. You do not need to make an appointment as the order has already been placed. The labs you are going to have done are BMET.    Testing/Procedures: Your physician has requested that you have cardiac CT. Cardiac computed tomography (CT) is a painless test that uses an x-ray machine to take clear, detailed pictures of your heart. For further information please visit [email or web address]. Please follow instruction sheet as given.    Your Cardiac CT will be scheduled at:   [facility] located off [facility] at the hospital.  Please arrive 30 minutes prior to your appointment time.  You can use the FREE valet parking offered at entrance to outpatient center (encouraged to control the heart rate for the test)   Please follow these instructions carefully (unless otherwise directed):  Hold all erectile dysfunction medications at least 3 days (72 hrs) prior to test.  On the Night Before the Test: Be sure to Drink plenty of water. Do not consume any caffeinated/decaffeinated beverages or chocolate 12 hours prior to your test. Do not take any antihistamines 12 hours prior to your test. If the patient has  contrast allergy: Patient will need a prescription for Prednisone and very clear instructions (as follows): Prednisone 50 mg - take 13 hours prior to test Take another Prednisone 50 mg 7 hours prior to test Take another Prednisone 50 mg 1 hour prior to test Take Benadryl 50 mg 1 hour prior to test Patient must complete all four doses of above prophylactic medications. Patient will need a ride after test due to Benadryl.  On the Day of the Test: Drink plenty of water until 1 hour prior to the test. Do not eat any food 4 hours prior to the test. No smoking 4 hours prior to test. You may take your regular medications prior to the test.  Take metoprolol (Lopressor) two hours prior to test. HOLD Furosemide/Hydrochlorothiazide morning of the test. FEMALES- please wear underwire-free bra if available, avoid dresses & tight clothing. Wear plain shirt no beads, sparkles, rhinestones, metal or heavy embroidery.  After the Test: Drink plenty of water. After receiving IV contrast, you may experience a mild flushed feeling. This is normal. On occasion, you may experience a mild rash up to 24 hours after the test. This is not dangerous. If this occurs, you can take Benadryl 25 mg and increase your fluid intake. If you experience trouble breathing, this can be serious. If it is severe call 911 IMMEDIATELY. If it is mild, please call our office. If you take any of these medications: Glipizide/Metformin, Avandament, Glucavance, please do not  take 48 hours after completing test unless otherwise instructed.  We will call to schedule your test 2-4 weeks out understanding that some insurance companies will need an authorization prior to the service being performed.      Follow-Up: At [facility], you and your health needs are our priority.  As part of our continuing mission to provide you with exceptional heart care, we have created designated Provider Care Teams.  These Care Teams include your  primary Cardiologist (physician) and Advanced Practice Providers (APPs -  Physician Assistants and Nurse Practitioners) who all work together to provide you with the care you need, when you need it.  We recommend signing up for the patient portal called "MyChart".  Sign up information is provided on this After Visit Summary.  MyChart is used to connect with patients for Virtual Visits (Telemedicine).  Patients are able to view lab/test results, encounter notes, upcoming appointments, etc.  Non-urgent messages can be sent to your provider as well.   To learn more about what you can do with MyChart, go to [email or web address].    Your next appointment:   2 month(s)  The format for your next appointment:   In Person  Provider:   Jenne Campus, MD    Other Instructions Cardiac CT Angiogram A cardiac CT angiogram is a procedure to look at the heart and the area around the heart. It may be done to help find the cause of chest pains or other symptoms of heart disease. During this procedure, a substance called contrast dye is injected into the blood vessels in the area to be checked. A large X-ray machine, called a CT scanner, then takes detailed pictures of the heart and the surrounding area. The procedure is also sometimes called a coronary CT angiogram, coronary artery scanning, or CTA. A cardiac CT angiogram allows the health care provider to see how well blood is flowing to and from the heart. The health care provider will be able to see if there are any problems, such as: Blockage or narrowing of the coronary arteries in the heart. Fluid around the heart. Signs of weakness or disease in the muscles, valves, and tissues of the heart. Tell a health care provider about: Any allergies you have. This is especially important if you have had a previous allergic reaction to contrast dye. All medicines you are taking, including vitamins, herbs, eye drops, creams, and over-the-counter  medicines. Any blood disorders you have. Any surgeries you have had. Any medical conditions you have. Whether you are pregnant or may be pregnant. Any anxiety disorders, chronic pain, or other conditions you have that may increase your stress or prevent you from lying still. What are the risks? Generally, this is a safe procedure. However, problems may occur, including: Bleeding. Infection. Allergic reactions to medicines or dyes. Damage to other structures or organs. Kidney damage from the contrast dye that is used. Increased risk of cancer from radiation exposure. This risk is low. Talk with your health care provider about: The risks and benefits of testing. How you can receive the lowest dose of radiation. What happens before the procedure? Wear comfortable clothing and remove any jewelry, glasses, dentures, and hearing aids. Follow instructions from your health care provider about eating and drinking. This may include: For 12 hours before the procedure -- avoid caffeine. This includes tea, coffee, soda, energy drinks, and diet pills. Drink plenty of water or other fluids that do not have caffeine in them. Being well hydrated can prevent  complications. For 4-6 hours before the procedure -- stop eating and drinking. The contrast dye can cause nausea, but this is less likely if your stomach is empty. Ask your health care provider about changing or stopping your regular medicines. This is especially important if you are taking diabetes medicines, blood thinners, or medicines to treat problems with erections (erectile dysfunction). What happens during the procedure?  Hair on your chest may need to be removed so that small sticky patches called electrodes can be placed on your chest. These will transmit information that helps to monitor your heart during the procedure. An IV will be inserted into one of your veins. You might be given a medicine to control your heart rate during the procedure.  This will help to ensure that good images are obtained. You will be asked to lie on an exam table. This table will slide in and out of the CT machine during the procedure. Contrast dye will be injected into the IV. You might feel warm, or you may get a metallic taste in your mouth. You will be given a medicine called nitroglycerin. This will relax or dilate the arteries in your heart. The table that you are lying on will move into the CT machine tunnel for the scan. The person running the machine will give you instructions while the scans are being done. You may be asked to: Keep your arms above your head. Hold your breath. Stay very still, even if the table is moving. When the scanning is complete, you will be moved out of the machine. The IV will be removed. The procedure may vary among health care providers and hospitals. What can I expect after the procedure? After your procedure, it is common to have: A metallic taste in your mouth from the contrast dye. A feeling of warmth. A headache from the nitroglycerin. Follow these instructions at home: Take over-the-counter and prescription medicines only as told by your health care provider. If you are told, drink enough fluid to keep your urine pale yellow. This will help to flush the contrast dye out of your body. Most people can return to their normal activities right after the procedure. Ask your health care provider what activities are safe for you. It is up to you to get the results of your procedure. Ask your health care provider, or the department that is doing the procedure, when your results will be ready. Keep all follow-up visits as told by your health care provider. This is important. Contact a health care provider if: You have any symptoms of allergy to the contrast dye. These include: Shortness of breath. Rash or hives. A racing heartbeat. Summary A cardiac CT angiogram is a procedure to look at the heart and the area around  the heart. It may be done to help find the cause of chest pains or other symptoms of heart disease. During this procedure, a large X-ray machine, called a CT scanner, takes detailed pictures of the heart and the surrounding area after a contrast dye has been injected into blood vessels in the area. Ask your health care provider about changing or stopping your regular medicines before the procedure. This is especially important if you are taking diabetes medicines, blood thinners, or medicines to treat erectile dysfunction. If you are told, drink enough fluid to keep your urine pale yellow. This will help to flush the contrast dye out of your body. This information is not intended to replace advice given to you by your health  care provider. Make sure you discuss any questions you have with your health care provider. Document Revised: 11/12/2021 Document Reviewed: 03/21/2019 Elsevier Patient Education  [place] physician has requested that you have an echocardiogram. Echocardiography is a painless test that uses sound waves to create images of your heart. It provides your doctor with information about the size and shape of your heart and how well your heart's chambers and valves are working. This procedure takes approximately one hour. There are no restrictions for this procedure. Please do NOT wear cologne, perfume, aftershave, or lotions (deodorant is allowed). Please arrive 15 minutes prior to your appointment time.    Important Information About Sugar

## 2022-10-13 NOTE — Progress Notes (Signed)
Cardiology Consultation:    Date:  10/13/2022   ID:  Casey [name], DOB 1971/06/04, MRN [id number]  PCP:  Elenore Paddy, NP  Cardiologist:  Jenne Campus, MD   Referring MD: Prince Rome, [place]*   No chief complaint on file. I have chest pain  History of Present Illness:    Casey [name] is a 52 y.o. female who is being seen today for the evaluation of chest pain and coronary artery calcifications at the request of Prince Rome, [place]*.  Past medical history significant for arm DVT, she has been taking anticoagulation for it, essential hypertension, hypercholesterolemia, status post laparoscopic sleeve gastrectomy done years ago.  Recently she started having chest pain.  Also she has noticed quite significant decrease in ability to exercise.  She said that been going on for last few weeks.  She is to walk with the dog on the regular basis but now she take shorter around because of inability to complete longer rounds that she will use to do.  When she walks she develops shortness of breath.  Also sometimes in the chest to the point that she had to slow down.  Interestingly she also developed some chest pain at night many times she is being awakened in the middle of the night with chest pain.  She does state that that sensation gets better.  There is also some shortness of breath associated with this.  She also described to have some pounding in the chest when she is trying to walk.  She does not smoke, she does have family history of premature coronary artery disease, she never had coronary events but her CT of the chest done recently in the emergency room showed calcification of the coronary arteries.  She was in the emergency room because of atypical chest pain troponins were negative CT angio of her chest to rule out PE was negative.  Past Medical History:  Diagnosis Date   Albuminuria 06/27/2015   Anxiety 11/04/2014   Arm DVT (deep venous thromboembolism), acute, left ([place])  08/17/2022   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/30/2014   Asthma    Atopic dermatitis 10/30/2008   Formatting of this note might be different from the original. Atopic dermatitis Formatting of this note might be different from the original. Formatting of this note might be different from the original. Atopic dermatitis Formatting of this note might be different from the original. Formatting of this note might be different from the original. Formatting of this note might be different from the or   Cataract 05/06/2015   Contusion of left wrist 07/08/2021   Crohn's disease ([place]) 04/21/2015   De Quervain's tenosynovitis, left 07/31/2021   Diabetes mellitus without complication ([place])    Diabetes type 2, controlled ([place]) 04/21/2015   Dry eyes 05/06/2015   DVT (deep venous thrombosis) ([place])    Dysfunction of both eustachian tubes 08/31/2012   Encounter for monitoring immunomodulating therapy 11/20/2021   Environmental allergies 08/12/2020   Esophageal candidiasis ([place]) 06/26/2022   Fibrositis 08/28/2014   Formatting of this note might be different from the original. Fibromyalgia Formatting of this note might be different from the original. Formatting of this note might be different from the original. Fibromyalgia   Flank pain 11/24/2020   Gastroesophageal reflux disease with esophagitis without hemorrhage 08/01/2019   Formatting of this note might be different from the original. Formatting of this note might be different from the original. 07/2019: chronic symptoms of esophageal reflux  since prior to sleeve gastrectomy surgery. Symptoms have worsened over the last 6 months with recent EGD findings of grade B esophagitis, irregular z line, erythematous mucosa, and evidence of sleeve gastrectomy. Biopsies with ch   Glaucoma suspect of both eyes 05/07/2016   Hematochezia 03/18/2016   Hematuria 06/18/2015   Hiatal hernia 10/07/2022   History of abnormal cervical Pap smear  11/29/2014   History of colon polyps 06/19/2019   Formatting of this note might be different from the original. Formatting of this note might be different from the original. 07/2019: 8 mm colonic polyp removed during colonoscopy, biopsy showed serrated polyp. Repeat colonoscopy recommended in one year. Formatting of this note might be different from the original. Added automatically from request for surgery [phone] Formatting of this note might b   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   History of kidney stones 08/12/2020   Hypercholesterolemia 10/07/2022   Hypertension, essential, benign 06/27/2015   Hypertensive disorder 08/28/2014   Formatting of this note might be different from the original. Hypertension Formatting of this note might be different from the original. Formatting of this note might be different from the original. 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 Keratoconus Formatting of this note might be different from the original. Formatting of this note might be different from the original. Keratoconus Formatting of this note might be different from the original. Formatting of this note might be different from the original. Keratoconus Formatting of this note might be different from the or   Microhematuria 06/27/2015   Migraine 08/28/2014   Formatting of this note might be different from the original. Migraine Formatting of this note might be different from the original. Formatting of this note might be different from the original. Migraine Formatting of this note might be different from the original. Migraine Formatting of this note might be different from the original. Migraine Formatting of this note might be different from the or   Migraine without aura and without status migrainosus, not intractable  08/28/2014   Formatting of this note might be different from the original. Formatting of this note might be different from the original. Formatting of this note might be different from the original. Migraine Formatting of this note might be different from the original. Migraine Formatting of this note might be different from the original. Formatting of this note might be different from the original. Migraine F   Mild intermittent asthma without complication [id number]   Formatting of this note might be different from the original. Formatting of this note might be different from the original. Formatting of this note might be different from the original. Asthma Formatting of this note might be different from the original. Asthma Formatting of this note might be different from the original. Formatting of this note might be different from the original. 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 Formatting of this note might be different from the original. 01/25/2016 with Dr. Johney Frame at [place] of this note might be different from the original. 01/25/2016 with Dr. Johney Frame at [place] of this note might be different from the original. Formatting of this note might be different from    Seronegative  inflammatory arthritis 08/28/2014   Formatting of this note might be different from the original. Formatting of this note might be different from the original. Arthritis; diagnosed as IBD related - 2012 Formatting of this note might be different from the original. Followed by rheumatologist. Had to change providers due to an insurance change earlier this year resulting in patient not being able to have Remicade for ~6 months. [facility]   Sero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 arthritis ([place])    Slow transit constipation 08/01/2019   Formatting of this note might be different from the original. Formatting of this note might be different from the original. Treated with PRN miralax. Recent colonoscopy 07/2019. Formatting of this note might be different from the original. Treated with PRN miralax. Recent colonoscopy 07/2019. Formatting of this note might be different from the original. Formatting of this note might be different f   Status post placement of ureteral stent 02/06/2021   Tooth infection 08/12/2020   Ureteral stone 01/20/2022   Urinary tract infection, site not specified 06/26/2022    Past Surgical History:  Procedure Laterality Date   BACK SURGERY     L5-S1   CERVICAL CONE BIOPSY  04/2000   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   EYE SURGERY     GASTRIC BYPASS  2017   Sleve   LAPAROSCOPIC GASTRIC SLEEVE RESECTION     Low back disc surgery     06/2000   TOTAL KNEE ARTHROPLASTY Right 11/2016    Current Medications: Current Meds  Medication Sig   acetaminophen (TYLENOL) 500 MG tablet Take 1,000 mg by mouth every 12 (twelve) hours as needed for mild pain or moderate pain.   ALBUTEROL SULFATE ER PO Take 1 puff by mouth every 4 (four) hours as needed (sob). Nebulizer   apixaban (ELIQUIS) 5 MG TABS tablet Take 1 tablet (5 mg total) by mouth 2 (two) times daily.   B Complex-C (B-COMPLEX WITH VITAMIN C) tablet Take 1 tablet by mouth daily.   Cholecalciferol (VITAMIN D3) 50 MCG (2000 UT) TABS Take 1 tablet  by mouth daily.   esomeprazole (NEXIUM) 40 MG capsule Take 40 mg by mouth 2 (two) times daily before a meal.   famotidine (PEPCID) 40 MG tablet Take 40 mg by mouth daily.   inFLIXimab (REMICADE) 100 MG injection Inject 100 mg into the vein as directed.   Loratadine-Pseudoephedrine (CLARITIN-D 24 HOUR PO) Take 1 tablet by mouth daily.   mirabegron ER (MYRBETRIQ) 50 MG TB24 tablet Take 50 mg by mouth daily.   montelukast (SINGULAIR) 10 MG tablet Take 10 mg by mouth at bedtime.   Multiple Vitamin (MULTIVITAMIN ADULT PO) Take 1 tablet by mouth daily.   ZOLMitriptan (ZOMIG) 2.5 MG tablet Take 2.5 mg by mouth once. May repeat in 2 hours if headache persists or recurs.     Allergies:   Albuterol sulfate, Tape, Formoterol, Hydrocodone, Hydrocodone-acetaminophen, Nickel, Other, Oxycodone-acetaminophen, Salmeterol, Strawberry extract, Advair hfa [fluticasone-salmeterol], Asa [aspirin], Cleocin [clindamycin], Clindamycin/lincomycin, Fludrocortisone acetate, Gabapentin, Hydrochlorothiazide, Imipramine, Ipratropium bromide, Medroxyprogesterone, Meloxicam, Metformin and related, Nystatin,  Penicillins, Pred forte [prednisolone], Prednisone, Proventil hfa [albuterol], Solu-medrol [methylprednisolone], Sulfa antibiotics, Tegaderm ag mesh [silver], Toradol [ketorolac tromethamine], and Tramadol   Social History   Socioeconomic History   Marital status: Married    Spouse name: Not on file   Number of children: Not on file   Years of education: Not on file   Highest education level: Not on file  Occupational History   Not on file  Tobacco Use   Smoking status: Never   Smokeless tobacco: Never  Substance and Sexual Activity   Alcohol use: Never   Drug use: Never   Sexual activity: Yes  Other Topics Concern   Not on file  Social History Narrative   Not on file   Social Determinants of Health   Financial Resource Strain: Not on file  Food Insecurity: Not on file  Transportation Needs: Not on file   Physical Activity: Not on file  Stress: Not on file  Social Connections: Not on file     Family History: The patient's family history includes Cancer in an other family member. ROS:   Please see the history of present illness.    All 14 point review of systems negative except as described per history of present illness.  EKGs/Labs/Other Studies Reviewed:    The following studies were reviewed today: CT of the chest reviewed which showed calcification of the coronary arteries  EKG:  EKG is  ordered today.  The ekg ordered today demonstrates normal sinus rhythm, normal P interval left axis nonspecific ST segment changes  Recent Labs: 08/31/2022: ALT 17 10/05/2022: BUN 13; Creatinine, Ser 0.99; Hemoglobin 15.7; Platelets 332; Potassium 3.4; Sodium 138  Recent Lipid Panel No results found for: "CHOL", "TRIG", "HDL", "CHOLHDL", "VLDL", "LDLCALC", "LDLDIRECT"  Physical Exam:    VS:  BP 126/86 (BP Location: Left Arm, Patient Position: Sitting)   Pulse 80   Ht '5\' 3"'$  (1.6 m)   Wt 224 lb (101.6 kg)   SpO2 98%   BMI 39.68 kg/m     Wt Readings from Last 3 Encounters:  10/13/22 224 lb (101.6 kg)  10/05/22 231 lb 7.7 oz (105 kg)  10/01/22 231 lb 7.7 oz (105 kg)     GEN:  Well nourished, well developed in no acute distress HEENT: Normal NECK: No JVD; No carotid bruits LYMPHATICS: No lymphadenopathy CARDIAC: RRR, no murmurs, no rubs, no gallops RESPIRATORY:  Clear to auscultation without rales, wheezing or rhonchi  ABDOMEN: Soft, non-tender, non-distended MUSCULOSKELETAL:  No edema; No deformity  SKIN: Warm and dry NEUROLOGIC:  Alert and oriented x 3 PSYCHIATRIC:  Normal affect   ASSESSMENT:    1. Precordial chest pain   2. Arm DVT (deep venous thromboembolism), acute, left ([place])   3. Hypertension, essential, benign   4. Obstructive sleep apnea   5. Gastroesophageal reflux disease with esophagitis without hemorrhage   6. S/P laparoscopic sleeve gastrectomy    PLAN:     In order of problems listed above:  Precordial chest pain with some worrisome characteristics.  She is anticoagulated.  She does have some problem aspirin.  I will give her prescription for nitroglycerin and gave her instruction how to take it, I will also put her on metoprolol 25 twice daily, she will be scheduled to have coronary CT angio to rule out any obstructive disease.  Because of dyspnea on exertion I will schedule her to have echocardiogram to assess left ventricle ejection fraction. Essential hypertension blood pressure slightly on the higher side today diastolic  but this is first visit we will continue monitoring. Obstructive sleep apnea that she used to had but after her sleeve surgery she lost significant mount of weight and sleep apnea got better now she is scheduled to have another test after he she gained some weight which I think is a good idea. Gastroesophageal reflux disease with corkscrew esophagus, he tells me that she is scheduled to have gastroscopy done in the next few weeks.  I will prefer to have coronary CT angiogram before the procedure.   Medication Adjustments/Labs and Tests Ordered: Current medicines are reviewed at length with the patient today.  Concerns regarding medicines are outlined above.  No orders of the defined types were placed in this encounter.  No orders of the defined types were placed in this encounter.   Signed, Park Liter, MD, [facility]. 10/13/2022 9:26 AM    [place]

## 2022-10-14 ENCOUNTER — Ambulatory Visit: Payer: 59 | Attending: Cardiology

## 2022-10-14 DIAGNOSIS — R0609 Other forms of dyspnea: Secondary | ICD-10-CM | POA: Diagnosis not present

## 2022-10-14 LAB — ECHOCARDIOGRAM COMPLETE
Area-P 1/2: 3.72 cm2
S' Lateral: 2.7 cm

## 2022-10-15 LAB — BASIC METABOLIC PANEL
BUN/Creatinine Ratio: 14 (ref 9–23)
BUN: 14 mg/dL (ref 6–24)
CO2: 24 mmol/L (ref 20–29)
Calcium: 9.7 mg/dL (ref 8.7–10.2)
Chloride: 101 mmol/L (ref 96–106)
Creatinine, Ser: 0.98 mg/dL (ref 0.57–1.00)
Glucose: 126 mg/dL — ABNORMAL HIGH (ref 70–99)
Potassium: 3.9 mmol/L (ref 3.5–5.2)
Sodium: 142 mmol/L (ref 134–144)
eGFR: 69 mL/min/{1.73_m2} (ref >59)

## 2022-10-19 ENCOUNTER — Telehealth: Payer: Self-pay

## 2022-10-19 NOTE — Telephone Encounter (Signed)
Patient notified of results through my chart.

## 2022-10-19 NOTE — Telephone Encounter (Signed)
-----   Message from Park Liter, MD sent at 10/15/2022  1:48 PM EST ----- Echocardiogram showed preserved left ventricle ejection fraction, overall looks good

## 2022-10-21 ENCOUNTER — Telehealth: Payer: Self-pay | Admitting: Cardiology

## 2022-10-21 ENCOUNTER — Encounter: Payer: Self-pay | Admitting: Cardiology

## 2022-10-21 NOTE — Telephone Encounter (Signed)
Pt would like a callback regarding her Cardiac results, pt states she can see the results are in on her mychart but she's not able to have access to them. She also stated she spoke with someone yesterday and they told her she would get a call today before 1 and she's still waiting. Please advise.

## 2022-10-21 NOTE — Telephone Encounter (Signed)
Pt call about CT results. LVM -Advised that her CT results are in Dr. Wendy Poet box to read. Let her know we would call as soon as we have his comments.

## 2022-10-22 NOTE — Telephone Encounter (Signed)
Called patient and informed her of her cardiac CT results. Patient verbalized understanding and had no further questions at this time.

## 2022-10-25 ENCOUNTER — Encounter: Payer: Self-pay | Admitting: Cardiology

## 2022-10-27 ENCOUNTER — Telehealth: Payer: Self-pay

## 2022-10-27 NOTE — Telephone Encounter (Signed)
Spoke with pt about lab results per Dr. Wendy Poet note. Pt verbalized understanding and had no further questions. Routed to PCP

## 2022-11-16 ENCOUNTER — Ambulatory Visit (INDEPENDENT_AMBULATORY_CARE_PROVIDER_SITE_OTHER): Payer: 59 | Admitting: Podiatry

## 2022-11-16 DIAGNOSIS — L84 Corns and callosities: Secondary | ICD-10-CM | POA: Diagnosis not present

## 2022-11-16 DIAGNOSIS — L600 Ingrowing nail: Secondary | ICD-10-CM | POA: Diagnosis not present

## 2022-11-16 MED ORDER — UREA 10 % EX CREA: TOPICAL_CREAM | CUTANEOUS | 0 refills | Status: DC | PRN

## 2022-11-16 NOTE — Progress Notes (Signed)
Subjective:  Patient ID: Casey [name], female    DOB: 07/29/1971,  MRN: [id number]  Chief Complaint  Patient presents with   Ingrown Toenail    Patient has ingrowns on bilateral great toes and callouses on the heels of bilteral feet, patient has tried self treating the ingrowns and was unsuccessful    52 y.o. female presents with concern for pain in the bilateral hallux nail.  On the medial border.  Has pain with palpation of this area as well as with wearing shoes.  She has tried to self treat her ingrown's by cutting them out but has been unsuccessful still having pain.  Denies any redness or drainage.  Also notices heavy callus on the heel bilaterally has tried multiple different lotions but nothing seems to work.  Does have some cracking skin as well.  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 Atopic dermatitis Formatting of this note might be different from the original. Formatting of this note might be different from the original. Atopic dermatitis Formatting of this note might be different from the original. Formatting of this note might be different from the original. Formatting of this note might be different from the or   Cataract 05/06/2015   Contusion of left wrist 07/08/2021   Crohn's disease (HCC) 04/21/2015   De Quervain's tenosynovitis, left 07/31/2021   Diabetes mellitus without complication (HCC)    Diabetes type 2, controlled (HCC) 04/21/2015   Dry eyes 05/06/2015   DVT (deep venous thrombosis) (HCC)    Dysfunction of both eustachian tubes 08/31/2012   Encounter for monitoring immunomodulating therapy 11/20/2021   Environmental allergies 08/12/2020   Esophageal candidiasis (HCC)  06/26/2022   Fibrositis 08/28/2014   Formatting of this note might be different from the original. Fibromyalgia Formatting of this note might be different from the original. Formatting of this note might be different from the original. Fibromyalgia   Flank pain 11/24/2020   Gastroesophageal reflux disease with esophagitis without hemorrhage 08/01/2019   Formatting of this note might be different from the original. Formatting of this note might be different from the original. 07/2019: chronic symptoms of esophageal reflux since prior to sleeve gastrectomy surgery. Symptoms have worsened over the last 6 months with recent EGD findings of grade B esophagitis, irregular z line, erythematous mucosa, and evidence of sleeve gastrectomy. 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 Formatting of this note might be different from the original. 07/2019: 8 mm colonic polyp removed during colonoscopy, biopsy showed serrated polyp. Repeat colonoscopy recommended in one year. Formatting of this note might be different from the original. Added automatically from request for surgery [id number] Formatting of this note might b   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   History of kidney stones 08/12/2020   Hypercholesterolemia 10/07/2022   Hypertension, essential, benign 06/27/2015   Hypertensive disorder 08/28/2014   Formatting of this note might be different from the original. Hypertension Formatting of this note might be different from  the original. Formatting of this note might be different from the original. 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 Keratoconus Formatting of this note might be different from the original. Formatting of this note might be different from the original. Keratoconus Formatting of this note might be different from the original. Formatting of this note might be different from the original. Keratoconus Formatting of this note might be different from the or   Microhematuria 06/27/2015   Migraine 08/28/2014   Formatting of this note might be different from the original. Migraine Formatting of this note might be different from the original. Formatting of this note might be different from the original. Migraine Formatting of this note might be different from the original. Migraine Formatting of this note might be different from the original. Migraine Formatting of this note might be different from the or   Migraine without aura and without status migrainosus, not intractable 08/28/2014   Formatting of this note might be different from the original. Formatting of this note might be different from the original. Formatting of this note might be different from the original. Migraine Formatting of this note might be different from the original. Migraine Formatting of this note might be different from the original. Formatting of this note might be different from the original. Migraine F   Mild intermittent asthma without complication 10/30/2008   Formatting of this note might be different from the original. Formatting of this note might be different from the original. Formatting of this note might be different from the original. Asthma Formatting of this note might be different from the original. Asthma Formatting of this note might be different from the original. Formatting of this note might be different from the original. 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 Formatting of this note might be different from the original. 01/25/2016 with Dr. Dionisio David at [facility]. [place] of this note might be different from the original. 01/25/2016 with Dr. Dionisio David at [facility]. [place] of this note might be different from the original. Formatting of this note might be different from    Seronegative inflammatory arthritis 08/28/2014   Formatting of this note might be different from the original. Formatting of this note might be different from the original. Arthritis; diagnosed as IBD related - 2012 Formatting of this note might be different from the original. Followed by rheumatologist. Had to change providers due to an insurance change earlier this year resulting in patient not being able to have Remicade for ~6 months. 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 Formatting of this note might be different from  the original. Treated with PRN miralax. Recent colonoscopy 07/2019. Formatting of this note might be different from the original. Treated with PRN miralax. Recent colonoscopy 07/2019. Formatting of this note might be different from the original. Formatting of this note might be different f   Status post placement of ureteral stent 02/06/2021   Tooth infection 08/12/2020   Ureteral  stone 01/20/2022   Urinary tract infection, site not specified 06/26/2022    Allergies  Allergen Reactions   Albuterol Sulfate Shortness Of Breath    Other reaction(s): Other (See Comments)  can't breathe  Other reaction(s): Other (See Comments) can't breathe   Tape     Other reaction(s): Other (See Comments), Other (See Comments), Other (See Comments)  severe skin breakdown  Cast and Bandage Cover  severe skin breakdown  severe skin breakdown   Formoterol Other (See Comments)    Other reaction(s): Headaches  Other reaction(s): Headaches  Other reaction(s): Headaches  Headaches  Other reaction(s): Headaches Other reaction(s): Headaches    Other reaction(s): Headaches    Headaches   Hydrocodone Rash   Hydrocodone-Acetaminophen Rash   Nickel Rash and Other (See Comments)   Other Rash    cast and bandage use  cast and bandage use  cast and bandage use  cast and bandage use    cast and bandage use cast and bandage use   Oxycodone-Acetaminophen Other (See Comments)    headache   Salmeterol Rash   Strawberry Extract Hives    Full body rash from strawberry   Advair Hfa [Fluticasone-Salmeterol]    Asa [Aspirin]    Cleocin [Clindamycin]    Clindamycin/Lincomycin    Fludrocortisone Acetate    Gabapentin    Hydrochlorothiazide    Imipramine    Ipratropium Bromide    Medroxyprogesterone    Meloxicam    Metformin And Related    Nystatin    Penicillins    Pred Forte [Prednisolone]    Prednisone    Proventil Hfa [Albuterol]    Solu-Medrol [Methylprednisolone]    Sulfa Antibiotics    Tegaderm Ag Mesh [Silver]    Toradol [Ketorolac Tromethamine]    Tramadol     ROS: Negative except as per HPI above  Objective:  General: AAO x3, NAD  Dermatological: Incurvation is present along the medial nail border of the bilateral great toe. There is localized edema without any erythema or increase in warmth around the nail border. There is no drainage or pus. There is no  ascending cellulitis. No malodor. No open lesions or pre-ulcerative lesions.  Thick hyper keratotic lesions present on bilateral heels.   Vascular:  Dorsalis Pedis artery and Posterior Tibial artery pedal pulses are 2/4 bilateral.  Capillary fill time < 3 sec to all digits.   Neruologic: Grossly intact via light touch bilateral. Protective threshold intact to all sites bilateral.   Musculoskeletal: No gross boney pedal deformities bilateral. No pain, crepitus, or limitation noted with foot and ankle range of motion bilateral. Muscular strength 5/5 in all groups tested bilateral.  Gait: Unassisted, Nonantalgic.   No images are attached to the encounter.  Assessment:   1. Ingrown nail of great toe of right foot   2. Ingrown nail of great toe of left foot   3. Callus of foot      Plan:  Patient was evaluated and treated and all questions answered.    Ingrown Nail, bilateral hallux medial border -Patient elects to proceed with minor surgery to remove ingrown toenail today. Consent reviewed and signed  by patient. -Ingrown nail excised. See procedure note. -Educated on post-procedure care including soaking. Written instructions provided and reviewed. -Patient to follow up in 2 weeks for nail check.  Procedure: Excision of Ingrown Toenail Location: Bilateral 1st toe medial nail borders. Anesthesia: Lidocaine 1% plain; 1.5 mL and Marcaine 0.5% plain; 1.5 mL, digital block. Skin Prep: Betadine. Dressing: Silvadene; telfa; dry, sterile, compression dressing. Technique: Following skin prep, the toe was exsanguinated and a tourniquet was secured at the base of the toe. The affected nail border was freed, split with a nail splitter, and excised. Chemical matrixectomy was then performed with phenol and irrigated out with alcohol. The tourniquet was then removed and sterile dressing applied. Disposition: Patient tolerated procedure well. Patient to return in 2 weeks for follow-up.   # Callus  of foot bilateral We discussed preventative and palliative care of these lesions including supportive and accommodative shoegear, padding, prefabricated and custom molded accommodative orthoses, use of a pumice stone and lotions/creams daily. -E Rx for urea 10% cream applied daily to both heels for hyperkeratotic callus  Return in about 2 weeks (around 11/30/2022) for Follow-up bilateral hallux nail ingrown removal.          Corinna Gab, DPM [facility]

## 2022-11-16 NOTE — Patient Instructions (Signed)

## 2022-11-16 NOTE — Progress Notes (Signed)
Subjective:  Patient ID: Casey [name], female    DOB: November 18, 1970,  MRN: [id number]  Chief Complaint  Patient presents with   Ingrown Toenail    Patient has ingrowns on bilateral great toes and callouses on the heels of bilteral feet, patient has tried self treating the ingrowns and was unsuccessful    52 y.o. female presents with concern for pain in the bilateral hallux nail.  On the medial border.  Has pain with palpation of this area as well as with wearing shoes.  She has tried to self treat her ingrown's by cutting them out but has been unsuccessful still having pain.  Denies any redness or drainage.  Also notices heavy callus on the heel bilaterally has tried multiple different lotions but nothing seems to work.  Does have some cracking skin as well.  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 Atopic dermatitis Formatting of this note might be different from the original. Formatting of this note might be different from the original. Atopic dermatitis Formatting of this note might be different from the original. Formatting of this note might be different from the original. Formatting of this note might be different from the or   Cataract 05/06/2015   Contusion of left wrist 07/08/2021   Crohn's disease (HCC) 04/21/2015   De Quervain's tenosynovitis, left 07/31/2021   Diabetes mellitus without complication (HCC)    Diabetes type 2, controlled (HCC) 04/21/2015   Dry eyes 05/06/2015   DVT (deep venous thrombosis) (HCC)    Dysfunction of both eustachian tubes 08/31/2012   Encounter for monitoring immunomodulating therapy 11/20/2021   Environmental allergies 08/12/2020   Esophageal candidiasis (HCC)  06/26/2022   Fibrositis 08/28/2014   Formatting of this note might be different from the original. Fibromyalgia Formatting of this note might be different from the original. Formatting of this note might be different from the original. Fibromyalgia   Flank pain 11/24/2020   Gastroesophageal reflux disease with esophagitis without hemorrhage 08/01/2019   Formatting of this note might be different from the original. Formatting of this note might be different from the original. 07/2019: chronic symptoms of esophageal reflux since prior to sleeve gastrectomy surgery. Symptoms have worsened over the last 6 months with recent EGD findings of grade B esophagitis, irregular z line, erythematous mucosa, and evidence of sleeve gastrectomy. 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 Formatting of this note might be different from the original. 07/2019: 8 mm colonic polyp removed during colonoscopy, biopsy showed serrated polyp. Repeat colonoscopy recommended in one year. Formatting of this note might be different from the original. Added automatically from request for surgery [phone] Formatting of this note might b   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   History of kidney stones 08/12/2020   Hypercholesterolemia 10/07/2022   Hypertension, essential, benign 06/27/2015   Hypertensive disorder 08/28/2014   Formatting of this note might be different from the original. Hypertension Formatting of this note might be different from  the original. Formatting of this note might be different from the original. 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 Keratoconus Formatting of this note might be different from the original. Formatting of this note might be different from the original. Keratoconus Formatting of this note might be different from the original. Formatting of this note might be different from the original. Keratoconus Formatting of this note might be different from the or   Microhematuria 06/27/2015   Migraine 08/28/2014   Formatting of this note might be different from the original. Migraine Formatting of this note might be different from the original. Formatting of this note might be different from the original. Migraine Formatting of this note might be different from the original. Migraine Formatting of this note might be different from the original. Migraine Formatting of this note might be different from the or   Migraine without aura and without status migrainosus, not intractable 08/28/2014   Formatting of this note might be different from the original. Formatting of this note might be different from the original. Formatting of this note might be different from the original. Migraine Formatting of this note might be different from the original. Migraine Formatting of this note might be different from the original. Formatting of this note might be different from the original. Migraine F   Mild intermittent asthma without complication 10/30/2008   Formatting of this note might be different from the original. Formatting of this note might be different from the original. Formatting of this note might be different from the original. Asthma Formatting of this note might be different from the original. Asthma Formatting of this note might be different from the original. Formatting of this note might be different from the original. 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 Formatting of this note might be different from the original. 01/25/2016 with Dr. Dionisio David at [facility]. [place] of this note might be different from the original. 01/25/2016 with Dr. Dionisio David at [facility]. [place] of this note might be different from the original. Formatting of this note might be different from    Seronegative inflammatory arthritis 08/28/2014   Formatting of this note might be different from the original. Formatting of this note might be different from the original. Arthritis; diagnosed as IBD related - 2012 Formatting of this note might be different from the original. Followed by rheumatologist. Had to change providers due to an insurance change earlier this year resulting in patient not being able to have Remicade for ~6 months. 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 Formatting of this note might be different from  the original. Treated with PRN miralax. Recent colonoscopy 07/2019. Formatting of this note might be different from the original. Treated with PRN miralax. Recent colonoscopy 07/2019. Formatting of this note might be different from the original. Formatting of this note might be different f   Status post placement of ureteral stent 02/06/2021   Tooth infection 08/12/2020   Ureteral  stone 01/20/2022   Urinary tract infection, site not specified 06/26/2022    Allergies  Allergen Reactions   Albuterol Sulfate Shortness Of Breath    Other reaction(s): Other (See Comments)  can't breathe  Other reaction(s): Other (See Comments) can't breathe   Tape     Other reaction(s): Other (See Comments), Other (See Comments), Other (See Comments)  severe skin breakdown  Cast and Bandage Cover  severe skin breakdown  severe skin breakdown   Formoterol Other (See Comments)    Other reaction(s): Headaches  Other reaction(s): Headaches  Other reaction(s): Headaches  Headaches  Other reaction(s): Headaches Other reaction(s): Headaches    Other reaction(s): Headaches    Headaches   Hydrocodone Rash   Hydrocodone-Acetaminophen Rash   Nickel Rash and Other (See Comments)   Other Rash    cast and bandage use  cast and bandage use  cast and bandage use  cast and bandage use    cast and bandage use cast and bandage use   Oxycodone-Acetaminophen Other (See Comments)    headache   Salmeterol Rash   Strawberry Extract Hives    Full body rash from strawberry   Advair Hfa [Fluticasone-Salmeterol]    Asa [Aspirin]    Cleocin [Clindamycin]    Clindamycin/Lincomycin    Fludrocortisone Acetate    Gabapentin    Hydrochlorothiazide    Imipramine    Ipratropium Bromide    Medroxyprogesterone    Meloxicam    Metformin And Related    Nystatin    Penicillins    Pred Forte [Prednisolone]    Prednisone    Proventil Hfa [Albuterol]    Solu-Medrol [Methylprednisolone]    Sulfa Antibiotics    Tegaderm Ag Mesh [Silver]    Toradol [Ketorolac Tromethamine]    Tramadol     ROS: Negative except as per HPI above  Objective:  General: AAO x3, NAD  Dermatological: Incurvation is present along the medial nail border of the bilateral great toe. There is localized edema without any erythema or increase in warmth around the nail border. There is no drainage or pus. There is no  ascending cellulitis. No malodor. No open lesions or pre-ulcerative lesions.  Thick hyper keratotic lesions present on bilateral heels.   Vascular:  Dorsalis Pedis artery and Posterior Tibial artery pedal pulses are 2/4 bilateral.  Capillary fill time < 3 sec to all digits.   Neruologic: Grossly intact via light touch bilateral. Protective threshold intact to all sites bilateral.   Musculoskeletal: No gross boney pedal deformities bilateral. No pain, crepitus, or limitation noted with foot and ankle range of motion bilateral. Muscular strength 5/5 in all groups tested bilateral.  Gait: Unassisted, Nonantalgic.   No images are attached to the encounter.  Assessment:   1. Ingrown nail of great toe of right foot   2. Ingrown nail of great toe of left foot   3. Callus of foot      Plan:  Patient was evaluated and treated and all questions answered.    Ingrown Nail, bilateral hallux medial border -Patient elects to proceed with minor surgery to remove ingrown toenail today. Consent reviewed and signed  by patient. -Ingrown nail excised. See procedure note. -Educated on post-procedure care including soaking. Written instructions provided and reviewed. -Patient to follow up in 2 weeks for nail check.  Procedure: Excision of Ingrown Toenail Location: Bilateral 1st toe medial nail borders. Anesthesia: Lidocaine 1% plain; 1.5 mL and Marcaine 0.5% plain; 1.5 mL, digital block. Skin Prep: Betadine. Dressing: Silvadene; telfa; dry, sterile, compression dressing. Technique: Following skin prep, the toe was exsanguinated and a tourniquet was secured at the base of the toe. The affected nail border was freed, split with a nail splitter, and excised. Chemical matrixectomy was then performed with phenol and irrigated out with alcohol. The tourniquet was then removed and sterile dressing applied. Disposition: Patient tolerated procedure well. Patient to return in 2 weeks for follow-up.   # Callus  of foot bilateral We discussed preventative and palliative care of these lesions including supportive and accommodative shoegear, padding, prefabricated and custom molded accommodative orthoses, use of a pumice stone and lotions/creams daily. -E Rx for urea 10% cream applied daily to both heels for hyperkeratotic callus  No follow-ups on file.          Corinna Gab, DPM [facility] / [facility]

## 2022-11-21 ENCOUNTER — Telehealth: Payer: Self-pay | Admitting: Podiatry

## 2022-11-21 MED ORDER — CEPHALEXIN 500 MG PO CAPS: 500.0000 mg | ORAL_CAPSULE | Freq: Three times a day (TID) | ORAL | 0 refills | Status: DC

## 2022-11-21 NOTE — Telephone Encounter (Signed)
Patient called answering service and is having redness and swelling from ingrown nail procedure I placed her on cephalexin advised to continue soaks and ointment she has an upcoming appointment and should continue this follow-up.  She has a listed allergy penicillin we reviewed her allergies and she says that she usually has amoxicillin when she has an infection.  She says that she is allergic to the binders and drugs and not the drug itself.  Should be able to tolerate cephalexin advised to watch closely for signs of allergic reaction.

## 2022-11-29 ENCOUNTER — Ambulatory Visit (INDEPENDENT_AMBULATORY_CARE_PROVIDER_SITE_OTHER): Payer: 59 | Admitting: Podiatry

## 2022-11-29 DIAGNOSIS — L600 Ingrowing nail: Secondary | ICD-10-CM | POA: Diagnosis not present

## 2022-11-29 NOTE — Progress Notes (Signed)
Subjective: Casey [name] is a 52 y.o.  female returns to office today for follow up evaluation after having bilateral Hallux medial border nail ingrown removal with phenol and alcohol matrixectomy approximately 2 weeks ago. Patient has been soaking using epsom salts and applying topical antibiotic covered with bandaid daily. Patient denies fevers, chills, nausea, vomiting. Denies any calf pain, chest pain, SOB.   Objective:  Vitals: Reviewed  General: Well developed, nourished, in no acute distress, alert and oriented x3   Dermatology: Skin is warm, dry and supple bilateral. Bilateral  hallux nail border appears to be clean, dry, with mild granular tissue and surrounding scab. There is no surrounding erythema, edema, drainage/purulence. The remaining nails appear unremarkable at this time. There are no other lesions or other signs of infection present.  Neurovascular status: Intact. No lower extremity swelling; No pain with calf compression bilateral.  Musculoskeletal: Decreased tenderness to palpation of the bilateral hallux nail fold(s). Muscular strength within normal limits bilateral.   Assesement and Plan: S/p phenol and alcohol matrixectomy to the  bilateral hallux nail medial, doing well.   -Continue soaking in epsom salts twice a day followed by antibiotic ointment and a band-aid. Can leave uncovered at night. Continue this until completely healed.  -If the area has not healed in 2 weeks, call the office for follow-up appointment, or sooner if any problems arise.  -Monitor for any signs/symptoms of infection. Call the office immediately if any occur or go directly to the emergency room. Call with any questions/concerns.        Corinna Gab, DPM [facility] / [facility]                   11/29/2022

## 2022-11-30 ENCOUNTER — Encounter: Payer: Self-pay | Admitting: Family

## 2022-11-30 ENCOUNTER — Ambulatory Visit (INDEPENDENT_AMBULATORY_CARE_PROVIDER_SITE_OTHER): Payer: 59 | Admitting: Family

## 2022-11-30 VITALS — BP 132/82 | HR 76 | Ht 63.0 in | Wt 226.4 lb

## 2022-11-30 DIAGNOSIS — N3281 Overactive bladder: Secondary | ICD-10-CM

## 2022-11-30 DIAGNOSIS — J45909 Unspecified asthma, uncomplicated: Secondary | ICD-10-CM

## 2022-11-30 DIAGNOSIS — K2100 Gastro-esophageal reflux disease with esophagitis, without bleeding: Secondary | ICD-10-CM

## 2022-11-30 DIAGNOSIS — Z1231 Encounter for screening mammogram for malignant neoplasm of breast: Secondary | ICD-10-CM

## 2022-11-30 DIAGNOSIS — J309 Allergic rhinitis, unspecified: Secondary | ICD-10-CM | POA: Diagnosis not present

## 2022-11-30 DIAGNOSIS — R5383 Other fatigue: Secondary | ICD-10-CM

## 2022-11-30 DIAGNOSIS — Z124 Encounter for screening for malignant neoplasm of cervix: Secondary | ICD-10-CM

## 2022-11-30 LAB — CBC WITH DIFFERENTIAL/PLATELET
Basophils Absolute: 0 10*3/uL (ref 0.0–0.1)
Basophils Relative: 0.3 % (ref 0.0–3.0)
Eosinophils Absolute: 0.1 10*3/uL (ref 0.0–0.7)
Eosinophils Relative: 1.1 % (ref 0.0–5.0)
HCT: 44.7 % (ref 36.0–46.0)
Hemoglobin: 15.4 g/dL — ABNORMAL HIGH (ref 12.0–15.0)
Lymphocytes Relative: 30.2 % (ref 12.0–46.0)
Lymphs Abs: 2.9 10*3/uL (ref 0.7–4.0)
MCHC: 34.4 g/dL (ref 30.0–36.0)
MCV: 86 fl (ref 78.0–100.0)
Monocytes Absolute: 0.7 10*3/uL (ref 0.1–1.0)
Monocytes Relative: 7.5 % (ref 3.0–12.0)
Neutro Abs: 5.9 10*3/uL (ref 1.4–7.7)
Neutrophils Relative %: 60.9 % (ref 43.0–77.0)
Platelets: 342 10*3/uL (ref 150.0–400.0)
RBC: 5.2 Mil/uL — ABNORMAL HIGH (ref 3.87–5.11)
RDW: 14.8 % (ref 11.5–15.5)
WBC: 9.6 10*3/uL (ref 4.0–10.5)

## 2022-11-30 LAB — COMPREHENSIVE METABOLIC PANEL
ALT: 12 U/L (ref 0–35)
AST: 17 U/L (ref 0–37)
Albumin: 3.8 g/dL (ref 3.5–5.2)
Alkaline Phosphatase: 65 U/L (ref 39–117)
BUN: 8 mg/dL (ref 6–23)
CO2: 31 mEq/L (ref 19–32)
Calcium: 9.5 mg/dL (ref 8.4–10.5)
Chloride: 101 mEq/L (ref 96–112)
Creatinine, Ser: 0.8 mg/dL (ref 0.40–1.20)
GFR: 84.05 mL/min (ref >60.00)
Glucose, Bld: 102 mg/dL — ABNORMAL HIGH (ref 70–99)
Potassium: 3.5 mEq/L (ref 3.5–5.1)
Sodium: 140 mEq/L (ref 135–145)
Total Bilirubin: 0.6 mg/dL (ref 0.2–1.2)
Total Protein: 7.7 g/dL (ref 6.0–8.3)

## 2022-11-30 LAB — HEMOGLOBIN A1C: Hgb A1c MFr Bld: 5.8 % (ref 4.6–6.5)

## 2022-11-30 LAB — TSH: TSH: 0.52 u[IU]/mL (ref 0.35–5.50)

## 2022-11-30 MED ORDER — ESOMEPRAZOLE MAGNESIUM 40 MG PO CPDR: 40.0000 mg | DELAYED_RELEASE_CAPSULE | Freq: Two times a day (BID) | ORAL | 11 refills | Status: DC

## 2022-11-30 MED ORDER — ALBUTEROL SULFATE (2.5 MG/3ML) 0.083% IN NEBU: 2.5000 mg | INHALATION_SOLUTION | Freq: Four times a day (QID) | RESPIRATORY_TRACT | 1 refills | Status: DC | PRN

## 2022-11-30 MED ORDER — FAMOTIDINE 40 MG PO TABS: 40.0000 mg | ORAL_TABLET | Freq: Every day | ORAL | 11 refills | Status: DC

## 2022-11-30 MED ORDER — ZOLMITRIPTAN 2.5 MG PO TABS: 2.5000 mg | ORAL_TABLET | Freq: Once | ORAL | 1 refills | Status: DC

## 2022-11-30 MED ORDER — MIRABEGRON ER 50 MG PO TB24: 50.0000 mg | ORAL_TABLET | Freq: Every day | ORAL | 11 refills | Status: DC

## 2022-11-30 MED ORDER — MONTELUKAST SODIUM 10 MG PO TABS: 10.0000 mg | ORAL_TABLET | Freq: Every day | ORAL | 11 refills | Status: DC

## 2022-11-30 NOTE — Progress Notes (Signed)
Casey [name] is a 52 y.o. female with the following history as recorded in [employer]:  Patient Active Problem List   Diagnosis Date Noted   Precordial chest pain 10/13/2022   Hypercholesterolemia 10/07/2022   Hiatal hernia 10/07/2022   Nausea and vomiting 09/27/2022   Arm DVT (deep venous thromboembolism), acute, left 08/17/2022   Esophageal candidiasis 06/26/2022   Urinary tract infection, site not specified 06/26/2022   Morbid obesity with BMI of 40.0-44.9, adult 03/31/2022   Ureteral stone 01/20/2022   Encounter for monitoring immunomodulating therapy 11/20/2021   Rheumatoid arthritis of multiple sites with 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 factor 11/16/2021   Pyelonephritis 09/25/2021   De Quervain's tenosynovitis, left 07/31/2021   Contusion of left wrist 07/08/2021   Status post placement of ureteral stent 02/06/2021   Flank pain 11/24/2020   Proteinuria 08/12/2020   Environmental allergies 08/12/2020   History of kidney stones 08/12/2020   Tooth infection 08/12/2020   Gastroesophageal reflux disease with esophagitis without hemorrhage 08/01/2019   Slow transit constipation 08/01/2019   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   History of colon polyps 06/19/2019   Irregular astigmatism of both eyes 10/11/2018   Nephrolithiasis 09/08/2018   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   Plantar wart 11/04/2016   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   Glaucoma suspect of both eyes 05/07/2016   Myopia with astigmatism and presbyopia 05/07/2016   Hematochezia 03/18/2016   Overactive bladder 11/18/2015   Incontinence 10/07/2015   Albuminuria 06/27/2015   Hypertension, essential, benign 06/27/2015   Microhematuria 06/27/2015   Hematuria 06/18/2015   Cataract 05/06/2015   Dry eyes 05/06/2015   Crohn's disease 04/21/2015   Diabetes type 2, controlled 04/21/2015   Osteoarthritis of both knees 04/21/2015   Right knee pain 04/21/2015   Arthritis associated with  inflammatory bowel disease 11/30/2014   History of abnormal cervical Pap smear 11/29/2014   Anxiety 11/04/2014   Fibrositis 08/28/2014   Hypertensive disorder 08/28/2014   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     albuterol (PROVENTIL) (2.5 MG/3ML) 0.083% nebulizer solution Take 3 mLs (2.5 mg total) by nebulization every 6 (six) hours as needed for wheezing or shortness of breath. 150 mL 1   ALBUTEROL SULFATE ER PO Take 1 puff by mouth every 4 (four) hours as needed (sob). Nebulizer     B Complex-C (B-COMPLEX WITH VITAMIN C) tablet Take 1 tablet by mouth daily.     Cholecalciferol (VITAMIN D3) 50 MCG (2000 UT) TABS Take 1 tablet by mouth daily.     inFLIXimab (REMICADE) 100 MG injection Inject 100 mg into the vein as directed.     Loratadine-Pseudoephedrine (CLARITIN-D 24 HOUR PO) Take 1 tablet by mouth daily.     metoprolol tartrate (LOPRESSOR) 25 MG tablet Take 1 tablet (25 mg total) by mouth 2 (two) times daily. 180 tablet 3   Multiple Vitamin (MULTIVITAMIN ADULT PO) Take 1 tablet by mouth daily.     nitroGLYCERIN (NITROSTAT) 0.4 MG SL tablet  Place 1 tablet (0.4 mg total) under the tongue every 5 (five) minutes as needed for chest pain. 25 tablet 6   urea (CARMOL) 10 % cream Apply topically as needed. 71 g 0   esomeprazole (NEXIUM) 40 MG capsule Take 1 capsule (40 mg total) by mouth 2 (two) times daily before a meal. 60  capsule 11   famotidine (PEPCID) 40 MG tablet Take 1 tablet (40 mg total) by mouth daily. 30 tablet 11   mirabegron ER (MYRBETRIQ) 50 MG TB24 tablet Take 1 tablet (50 mg total) by mouth daily. 30 tablet 11   montelukast (SINGULAIR) 10 MG tablet Take 1 tablet (10 mg total) by mouth at bedtime. 30 tablet 11   ZOLMitriptan (ZOMIG) 2.5 MG tablet Take 1 tablet (2.5 mg total) by mouth once for 1 dose. May repeat in 2 hours if headache persists or recurs. 10 tablet 1   No current facility-administered medications for this visit.    Allergies: Albuterol sulfate, Tape, Formoterol, Hydrocodone, Hydrocodone-acetaminophen, Nickel, Other, Oxycodone-acetaminophen, Salmeterol, Strawberry extract, Advair hfa [fluticasone-salmeterol], Asa [aspirin], Cleocin [clindamycin], Clindamycin/lincomycin, Fludrocortisone acetate, Gabapentin, Hydrochlorothiazide, Imipramine, Ipratropium bromide, Medroxyprogesterone, Meloxicam, Metformin and related, Nystatin, Penicillins, Pred forte [prednisolone], Prednisone, Proventil hfa [albuterol], Solu-medrol [methylprednisolone], Sulfa antibiotics, Tegaderm ag mesh [silver], Toradol [ketorolac tromethamine], and Tramadol  Past Medical History:  Diagnosis Date   Albuminuria 06/27/2015   Anxiety 11/04/2014   Arm DVT (deep venous thromboembolism), acute, left 08/17/2022   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/30/2014   Asthma    Atopic dermatitis 10/30/2008   Formatting of this note might be different from the original. Atopic dermatitis Formatting of this note might be different from the original. Formatting of this note might be different from the original. Atopic dermatitis Formatting of this note might be different from the original. Formatting of this note might be different from the original. Formatting of this note might be different from the or   Cataract 05/06/2015   Contusion of left wrist 07/08/2021   Crohn's disease 04/21/2015   De Quervain's  tenosynovitis, left 07/31/2021   Diabetes mellitus without complication    Diabetes type 2, controlled 04/21/2015   Dry eyes 05/06/2015   DVT (deep venous thrombosis)    Dysfunction of both eustachian tubes 08/31/2012   Encounter for monitoring immunomodulating therapy 11/20/2021   Environmental allergies 08/12/2020   Esophageal candidiasis 06/26/2022   Fibrositis 08/28/2014   Formatting of this note might be different from the original. Fibromyalgia Formatting of this note might be different from the original. Formatting of this note might be different from the original. Fibromyalgia   Flank pain 11/24/2020   Gastroesophageal reflux disease with esophagitis without hemorrhage 08/01/2019   Formatting of this note might be different from the original. Formatting of this note might be different from the original. 07/2019: chronic symptoms of esophageal reflux since prior to sleeve gastrectomy surgery. Symptoms have worsened over the last 6 months with recent EGD findings of grade B esophagitis, irregular z line, erythematous mucosa, and evidence of sleeve gastrectomy. 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 Formatting of this note might be different from the original. 07/2019: 8 mm colonic polyp removed during colonoscopy, biopsy showed serrated polyp. Repeat colonoscopy recommended in one year. Formatting of  this note might be different from the original. Added automatically from request for surgery [phone] Formatting of this note might b   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   History of kidney stones 08/12/2020   Hypercholesterolemia 10/07/2022   Hypertension, essential, benign 06/27/2015   Hypertensive disorder 08/28/2014   Formatting of  this note might be different from the original. Hypertension Formatting of this note might be different from the original. Formatting of this note might be different from the original. 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 Keratoconus Formatting of this note might be different from the original. Formatting of this note might be different from the original. Keratoconus Formatting of this note might be different from the original. Formatting of this note might be different from the original. Keratoconus Formatting of this note might be different from the or   Microhematuria 06/27/2015   Migraine 08/28/2014   Formatting of this note might be different from the original. Migraine Formatting of this note might be different from the original. Formatting of this note might be different from the original. Migraine Formatting of this note might be different from the original. Migraine Formatting of this note might be different from the original. Migraine Formatting of this note might be different from the or   Migraine without aura and without status migrainosus, not intractable 08/28/2014   Formatting of this note might be different from the original. Formatting of this note might be different from the original. Formatting of this note might be different from the original. Migraine Formatting of this note might be different from the original. Migraine Formatting of this note might be different from the original. Formatting of this note might be different from the original. Migraine F   Mild intermittent asthma without complication 10/30/2008   Formatting of this note might be different from the original. Formatting of this note might be different from the original. Formatting of this note might be different from the original. Asthma  Formatting of this note might be different from the original. Asthma Formatting of this note might be different from the original. Formatting of this note might be different from the original. 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 Formatting of this note might be different from the original. 01/25/2016 with Dr. Dionisio David at [facility]. [place] of this note might be different from the original. 01/25/2016 with Dr. Dionisio David at [facility] [place] of this note might be different from the original. Formatting of this note might be different from    Seronegative inflammatory arthritis 08/28/2014   Formatting of this note might be different from the original. Formatting of this note might be different from the original. Arthritis; diagnosed as IBD related - 2012 Formatting of  this note might be different from the original. Followed by rheumatologist. Had to change providers due to an insurance change earlier this year resulting in patient not being able to have Remicade for ~6 months. [place]   Sero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 arthritis    Slow transit constipation 08/01/2019   Formatting of this note might be different from the original. Formatting of this note might be different from the original. Treated with PRN miralax. Recent colonoscopy  07/2019. Formatting of this note might be different from the original. Treated with PRN miralax. Recent colonoscopy 07/2019. Formatting of this note might be different from the original. Formatting of this note might be different f   Status post placement of ureteral stent 02/06/2021   Tooth infection 08/12/2020   Ureteral stone 01/20/2022   Urinary tract infection, site not specified 06/26/2022    Past Surgical History:  Procedure Laterality Date   BACK SURGERY     L5-S1   CERVICAL CONE BIOPSY  04/2000   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   EYE SURGERY     GASTRIC BYPASS  2017   Sleve   LAPAROSCOPIC GASTRIC SLEEVE RESECTION     Low back disc surgery     06/2000   TOTAL KNEE ARTHROPLASTY Right 11/2016    Family History  Problem Relation Age of Onset   Cancer Other     Social History   Tobacco Use   Smoking status: Never   Smokeless tobacco: Never  Substance Use Topics   Alcohol use: Never    Subjective:   Presents today as a new patient today- complicated medical history;  Under care of numerous specialists including rheumatology, GI, cardiology and nephrology;  Per patient, her PCP has historically managed her medications- has upper endoscopy scheduled later this week;   Gastric Sleeve surgery in 2017- lost 140 pounds- re-gained 40 pounds; History of Type 2 Diabetes- resolved with weight loss;   Requesting updated referral to allergy/asthma, pulmonology and OB-GYN     Objective:  Vitals:   11/30/22 0934  BP: 132/82  Pulse: 76  SpO2: 97%  Weight: 226 lb 6.4 oz (102.7 kg)  Height:  (1.6 m)    General: Well developed, well nourished, in no acute distress  Skin : Warm and dry.  Head: Normocephalic and atraumatic  Eyes: Sclera and conjunctiva clear; pupils round and reactive to light; extraocular movements intact  Ears: External normal; canals clear; tympanic membranes normal  Oropharynx: Pink, supple. No suspicious lesions  Neck: Supple without thyromegaly,  adenopathy  Lungs: Respirations unlabored; clear to auscultation bilaterally without wheeze, rales, rhonchi  CVS exam: normal rate and regular rhythm.  Neurologic: Alert and oriented; speech intact; face symmetrical; moves all extremities well; CNII-XII intact without focal deficit   Assessment:  1. Cervical cancer screening   2. Allergic rhinitis, unspecified seasonality, unspecified trigger   3. Asthma, unspecified asthma severity, unspecified whether complicated, unspecified whether persistent   4. Visit for screening mammogram   5. Other fatigue     Plan:  Referrals updated to GYN, allergy/ asthma and pulmonology as requested;  Update order for screening mammogram; Check CBC, CMP, Hgba1c today;   No follow-ups on file.  Orders Placed This Encounter  Procedures   MM Digital Screening    Standing Status:   Future    Standing Expiration Date:   11/30/2023    Order Specific Question:   Reason for Exam (SYMPTOM  OR DIAGNOSIS REQUIRED)    Answer:   screening mammogram    Order  Specific Question:   Is the patient pregnant?    Answer:   No    Order Specific Question:   Preferred imaging location?    Answer:   [facility]   CBC with Differential/Platelet   Comp Met (CMET)   TSH   Hemoglobin A1c   Ambulatory referral to Obstetrics / Gynecology    Referral Priority:   Routine    Referral Type:   Consultation    Referral Reason:   Specialty Services Required    Requested Specialty:   Obstetrics and Gynecology    Number of Visits Requested:   1   Ambulatory referral to Allergy    Referral Priority:   Routine    Referral Type:   Allergy Testing    Referral Reason:   Specialty Services Required    Requested Specialty:   Allergy    Number of Visits Requested:   1   Ambulatory referral to Pulmonology    Referral Priority:   Routine    Referral Type:   Consultation    Referral Reason:   Specialty Services Required    Requested Specialty:   Pulmonary Disease    Number of  Visits Requested:   1    Requested Prescriptions   Signed Prescriptions Disp Refills   esomeprazole (NEXIUM) 40 MG capsule 60 capsule 11    Sig: Take 1 capsule (40 mg total) by mouth 2 (two) times daily before a meal.   famotidine (PEPCID) 40 MG tablet 30 tablet 11    Sig: Take 1 tablet (40 mg total) by mouth daily.   mirabegron ER (MYRBETRIQ) 50 MG TB24 tablet 30 tablet 11    Sig: Take 1 tablet (50 mg total) by mouth daily.   montelukast (SINGULAIR) 10 MG tablet 30 tablet 11    Sig: Take 1 tablet (10 mg total) by mouth at bedtime.   ZOLMitriptan (ZOMIG) 2.5 MG tablet 10 tablet 1    Sig: Take 1 tablet (2.5 mg total) by mouth once for 1 dose. May repeat in 2 hours if headache persists or recurs.   albuterol (PROVENTIL) (2.5 MG/3ML) 0.083% nebulizer solution 150 mL 1    Sig: Take 3 mLs (2.5 mg total) by nebulization every 6 (six) hours as needed for wheezing or shortness of breath.

## 2022-12-01 ENCOUNTER — Telehealth (HOSPITAL_BASED_OUTPATIENT_CLINIC_OR_DEPARTMENT_OTHER): Payer: Self-pay

## 2022-12-02 ENCOUNTER — Telehealth: Payer: Self-pay

## 2022-12-02 NOTE — Telephone Encounter (Signed)
PA approved.   Request Reference Number: [id number]. MYRBETRIQ TAB  is approved through 12/02/2023. Your patient may now fill this prescription and it will be covered. Authorization Expiration Date: 12/02/2023

## 2022-12-02 NOTE — Telephone Encounter (Signed)
PA initiated via Covermymeds; KEY: [id number].  This medication or product is on your plan's list of covered drugs. Prior authorization is not required at this time. If your pharmacy has questions regarding the processing of your prescription, please have them call the [facility] pharmacy help desk at [phone]. **Please note: This request was submitted electronically. Formulary lowering, tiering exception, cost reduction and/or pre-benefit determination review (including prospective Medicare hospice reviews) requests cannot be requested using this method of submission. Providers contact us at [phone] for further assistance.

## 2022-12-02 NOTE — Telephone Encounter (Signed)
PA initiated via Covermymeds; KEY: [id number]. Awaiting determination.

## 2022-12-08 IMAGING — CT CT RENAL STONE PROTOCOL
2 of 4 series · 16 of 46 positions shown, 18 images · non-contrast
Comparison: 02/02/2021

CLINICAL DATA: Fever and abdominal pain.  Status post lithotripsy.

EXAM:
CT ABDOMEN AND PELVIS WITHOUT CONTRAST
TECHNIQUE: Multidetector CT imaging of the abdomen and pelvis was performed
following the standard protocol without IV contrast.

[Series 3: ap without · axial · non-contrast · 0.98mm/px · z∈[-188,+227]mm · 13 of 93 slices shown, 15 images]
[im 5/93  soft-tissue]
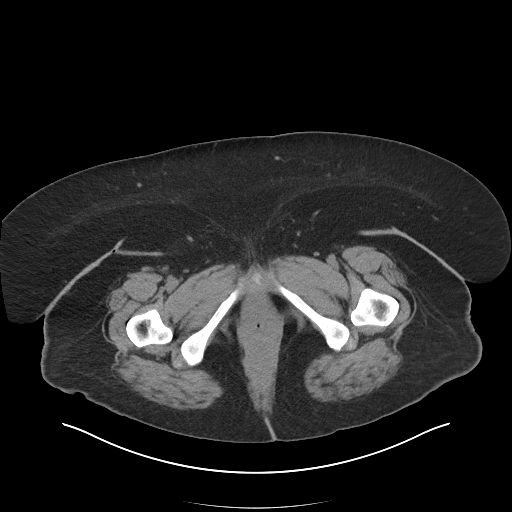
[im 5/93  bone]
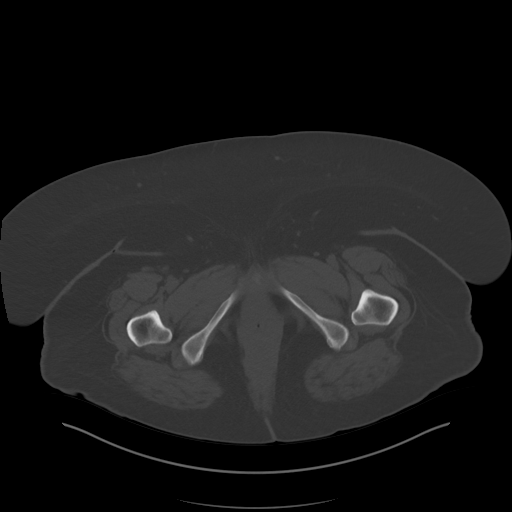
[im 14/93  soft-tissue]
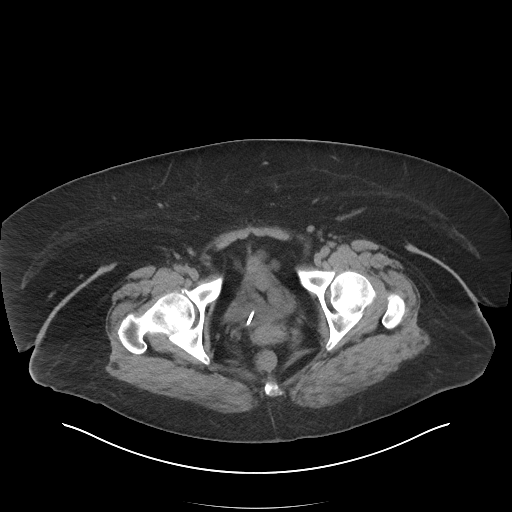
[im 19/93  soft-tissue]
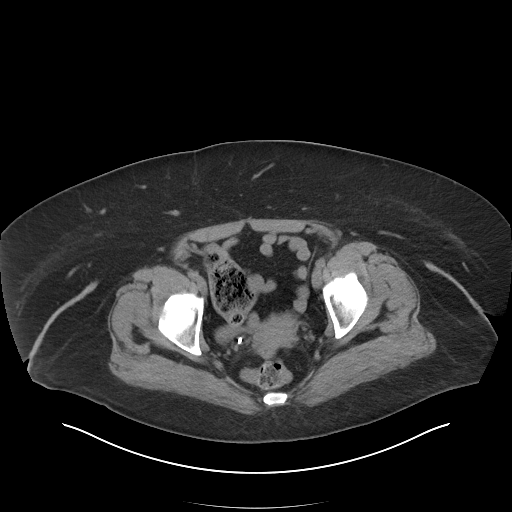
[im 28/93  soft-tissue]
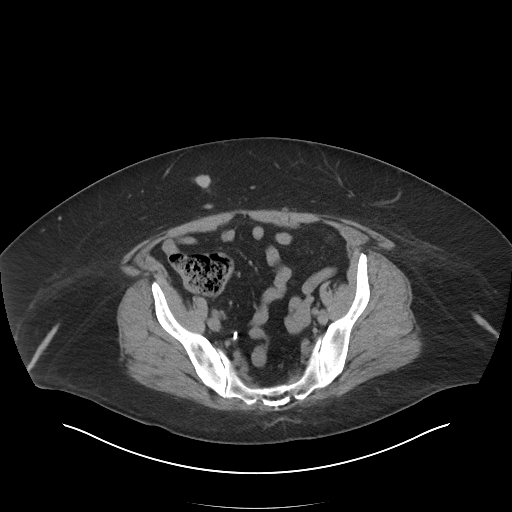
[im 33/93  soft-tissue]
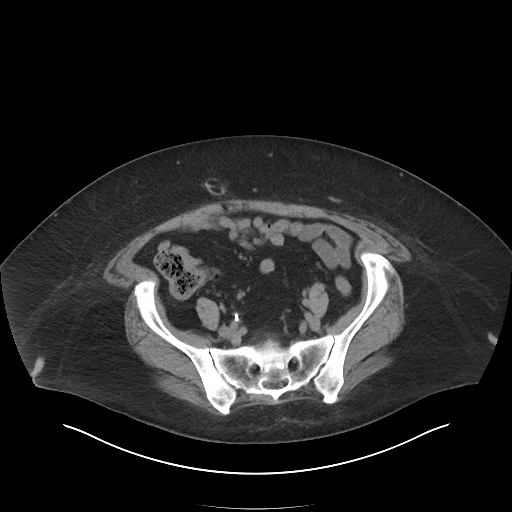
[im 42/93  soft-tissue]
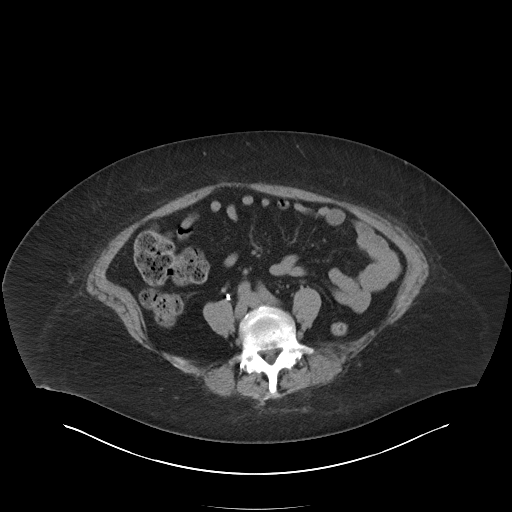
[im 47/93  soft-tissue]
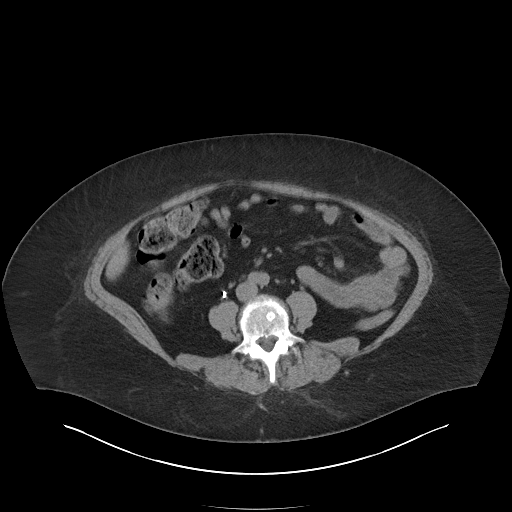
[im 51/93  soft-tissue]
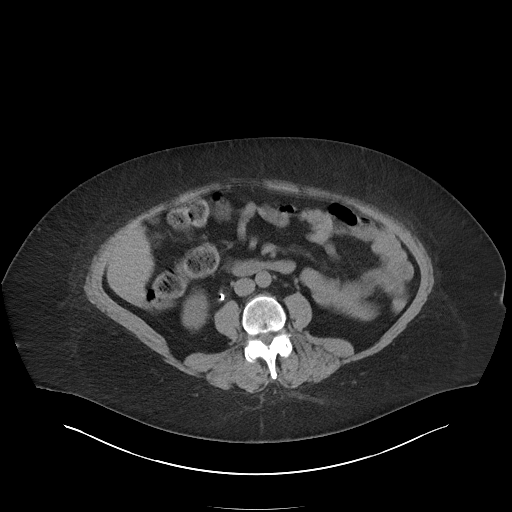
[im 60/93  soft-tissue]
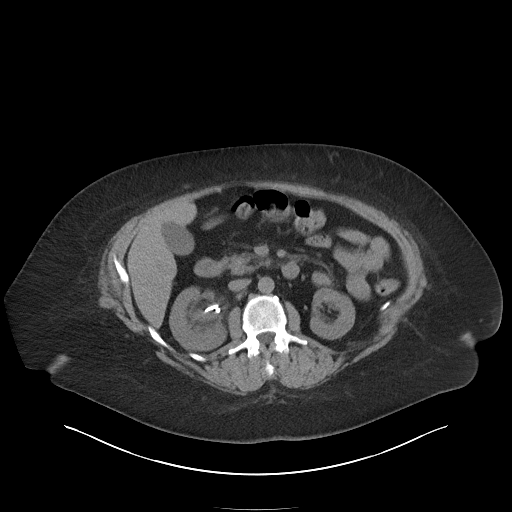
[im 60/93  bone]
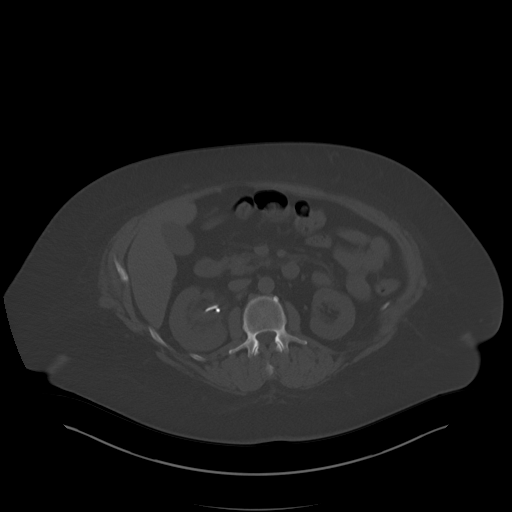
[im 65/93  soft-tissue]
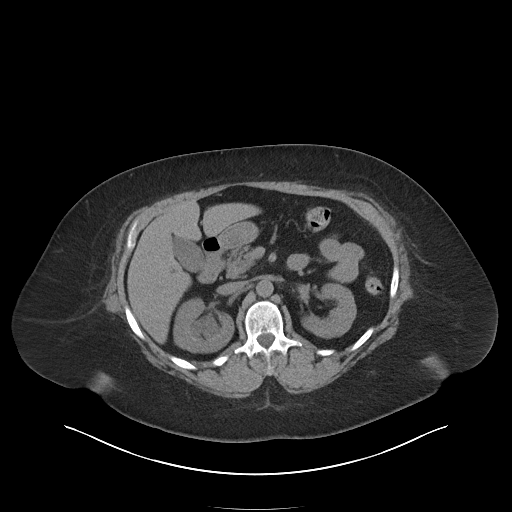
[im 74/93  soft-tissue]
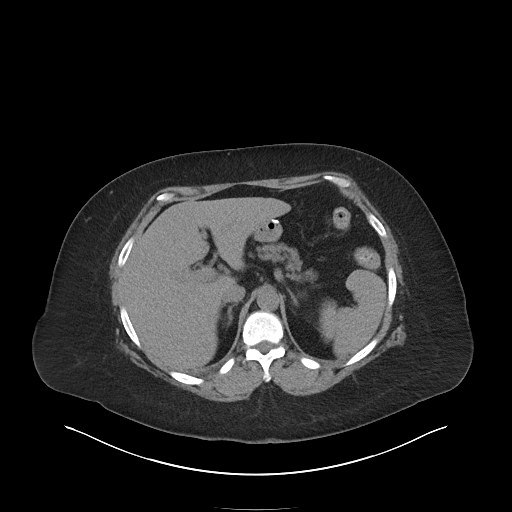
[im 79/93  soft-tissue]
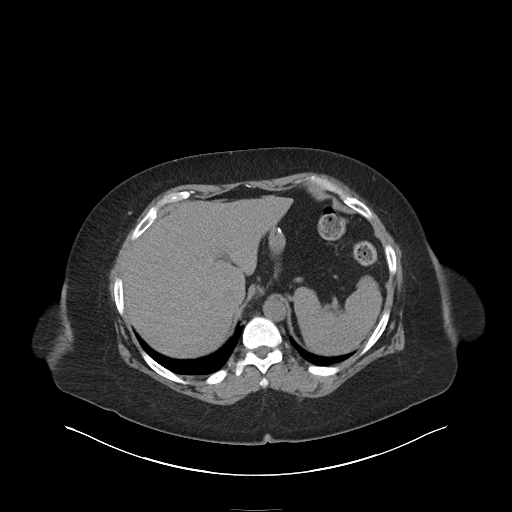
[im 88/93  soft-tissue]
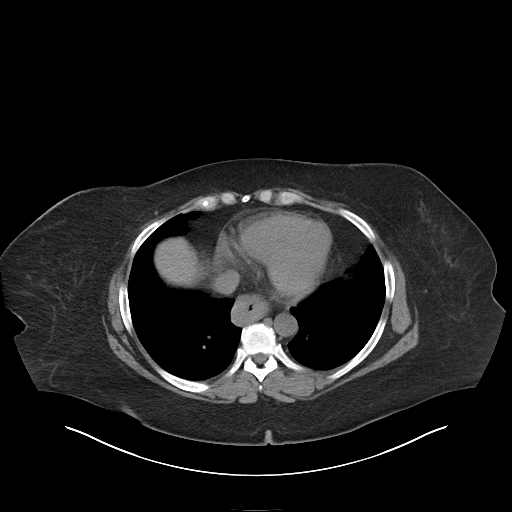

[Series 6: cor · coronal · 0.84mm/px · 3 of 92 slices shown]
[im 31/92  soft-tissue]
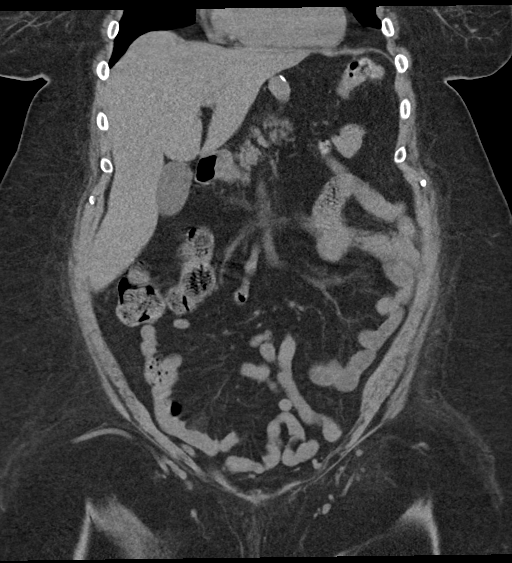
[im 41/92  soft-tissue]
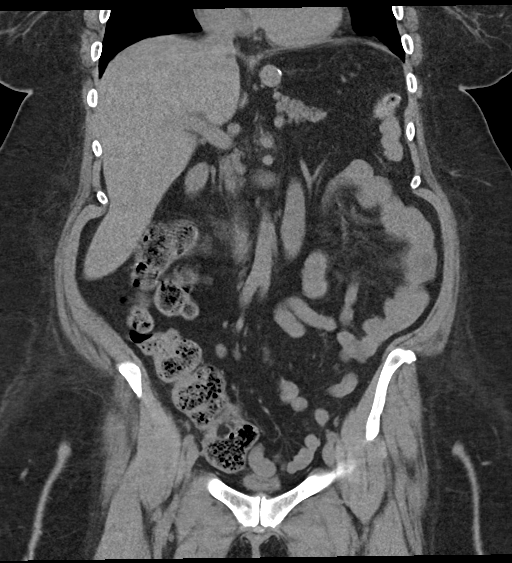
[im 51/92  soft-tissue]
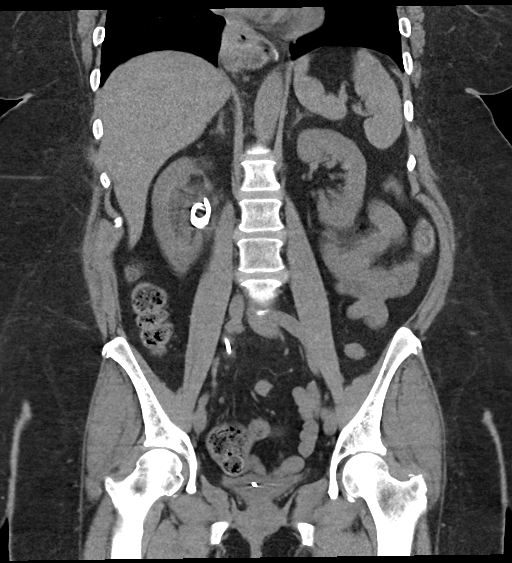

[16 of 46 positions shown; findings below may reference images not displayed]

FINDINGS: LOWER CHEST: Normal.

HEPATOBILIARY: Normal hepatic contours. No intra- or extrahepatic
biliary dilatation. The gallbladder is normal.

PANCREAS: Normal pancreas. No ductal dilatation or peripancreatic
fluid collection.

SPLEEN: Normal.

ADRENALS/URINARY TRACT: The adrenal glands are normal. Right
nephroureteral stent with mild hydronephrosis. Mild proximal
periureteral stranding. No left hydronephrosis or obstructive
nephrolithiasis. There is a punctate calcification in the interpolar
left kidney. The urinary bladder is normal for degree of distention

STOMACH/BOWEL: Small hiatal hernia. Postsurgical changes of sleeve
gastrectomy. No small bowel dilatation or inflammation. No focal
colonic abnormality. The appendix is not visualized. No right lower
quadrant inflammation or free fluid.

VASCULAR/LYMPHATIC: Normal course and caliber of the major abdominal
vessels. No abdominal or pelvic lymphadenopathy.

REPRODUCTIVE: Normal uterus. No adnexal mass.

MUSCULOSKELETAL. No bony spinal canal stenosis or focal osseous
abnormality.

OTHER: Small fat containing umbilical hernia.
IMPRESSION: 1. Status post right nephroureteral stent with unchanged mild
hydronephrosis and proximal periureteral stranding.
2. Punctate nonobstructing left renal calculus.

## 2022-12-20 ENCOUNTER — Ambulatory Visit (HOSPITAL_BASED_OUTPATIENT_CLINIC_OR_DEPARTMENT_OTHER)
Admission: RE | Admit: 2022-12-20 | Discharge: 2022-12-20 | Disposition: A | Discharge status: Home / Self Care | Payer: 59 | Source: Ambulatory Visit | Attending: Family | Admitting: Family

## 2022-12-20 ENCOUNTER — Encounter (HOSPITAL_BASED_OUTPATIENT_CLINIC_OR_DEPARTMENT_OTHER): Payer: Self-pay

## 2022-12-20 ENCOUNTER — Ambulatory Visit (INDEPENDENT_AMBULATORY_CARE_PROVIDER_SITE_OTHER): Payer: 59 | Admitting: Pulmonary Disease

## 2022-12-20 ENCOUNTER — Encounter: Payer: Self-pay | Admitting: Pulmonary Disease

## 2022-12-20 VITALS — BP 120/80 | HR 85 | Ht 63.0 in | Wt 228.0 lb

## 2022-12-20 DIAGNOSIS — J45909 Unspecified asthma, uncomplicated: Secondary | ICD-10-CM

## 2022-12-20 DIAGNOSIS — G4733 Obstructive sleep apnea (adult) (pediatric): Secondary | ICD-10-CM

## 2022-12-20 DIAGNOSIS — Z1231 Encounter for screening mammogram for malignant neoplasm of breast: Secondary | ICD-10-CM | POA: Diagnosis present

## 2022-12-20 NOTE — Progress Notes (Signed)
@Patient  ID: Casey [name], female    DOB: [birth year], 52 y.o.   MRN: [id number]  Chief Complaint  Patient presents with   Consult    Consult for asthma    Referring provider: Olive Bass,*  HPI:   52 y.o. woman whom we are seeing for evaluation of asthma.  Note from referring provider reviewed.  Patient with long history of asthma.  Has been through multiple rounds of treatment.  Cannot tolerate albuterol HFA due to allergies to an active ingredient.  Has tried multiple different ICS/LABA therapies in the past cannot tolerate due to an active ingredients in [facility].  They does try DPI's, several, cannot tolerate due to allergies to inactive ingredients.  Has tried nebulized, long-acting nebulized therapies and cannot tolerate due to allergies to an active ingredients.  Prednisone in the past is also called worsening symptoms due to an active ingredients.  Basically, she has worsening symptoms, wheezing shortness of breath etc. with these traditional asthma treatments.  She cannot tolerate albuterol nebulized.  There is a period of time of the last few years where her asthma was well-controlled rare albuterol use.  However, she moved to the [place] over the last couple years.  The pollens of really bother her.  Both from a upper airway, nasal congestion rhinorrhea allergies as well as worsening asthma symptoms.  She does endorse refractory GERD as well.  She is on biologic therapy for rheumatoid arthritis.  She had chest x-ray 09/2022 that reveals clear lungs on my review and interpretation.  She had a CT chest 08/2022 reveals clear lungs bilaterally with signs of mosaicism on my review and interpretation.  She had a second CT chest 09/2022 that reveals clear lungs bilaterally with signs of mosaicism on my review and interpretation.  Discussed at length role and rationale for biologic therapy given her inability to tolerate traditional inhaled therapies and steroid therapies.  Discussed  risk and benefits.  Discussed expected results.  After shared decision-making, agreed to move forward with evaluation for biologic therapy given her inability to tolerate inhaled therapies.   Questionaires / Pulmonary Flowsheets:   ACT:  Asthma Control Test ACT Total Score  12/20/2022 10:05 AM 11    MMRC:     No data to display          Epworth:      No data to display          Tests:   FENO:  No results found for: "NITRICOXIDE"  PFT:     No data to display          WALK:      No data to display          Imaging: Personally reviewed and as per EMR and discussion in this note No results found.  Lab Results: Personally reviewed CBC    Component Value Date/Time   WBC 9.6 11/30/2022 1000   RBC 5.20 (H) 11/30/2022 1000   HGB 15.4 (H) 11/30/2022 1000   HCT 44.7 11/30/2022 1000   PLT 342.0 11/30/2022 1000   MCV 86.0 11/30/2022 1000   MCH 28.8 10/05/2022 1415   MCHC 34.4 11/30/2022 1000   RDW 14.8 11/30/2022 1000   LYMPHSABS 2.9 11/30/2022 1000   MONOABS 0.7 11/30/2022 1000   EOSABS 0.1 11/30/2022 1000   BASOSABS 0.0 11/30/2022 1000    BMET    Component Value Date/Time   NA 140 11/30/2022 1000   NA 142 10/14/2022 1408   K 3.5 11/30/2022  1000   CL 101 11/30/2022 1000   CO2 31 11/30/2022 1000   GLUCOSE 102 (H) 11/30/2022 1000   BUN 8 11/30/2022 1000   BUN 14 10/14/2022 1408   CREATININE 0.80 11/30/2022 1000   CALCIUM 9.5 11/30/2022 1000   GFRNONAA >60 10/05/2022 1415    BNP No results found for: "BNP"  ProBNP No results found for: "PROBNP"  Specialty Problems       Pulmonary Problems   Asthma    Formatting of this note might be different from the original. Asthma Formatting of this note might be different from the original. Formatting of this note might be different from the original. Asthma Formatting of this note might be different from the original. Asthma Formatting of this note might be different from the original. Asthma  Formatting of this note might be different from the original. Formatting of this note might be different from the original. Formatting of this note might be different from the original. Asthma Formatting of this note might be different from the original. Asthma Formatting of this note might be different from the original. Asthma Formatting of this note might be different from the original. Asthma      Mild intermittent asthma without complication    Formatting of this note might be different from the original. Formatting of this note might be different from the original. Formatting of this note might be different from the original. Asthma Formatting of this note might be different from the original. Asthma Formatting of this note might be different from the original. Formatting of this note might be different from the original. Asthma Formatting of this note might be different from the original. Asthma Formatting of this note might be different from the original. Formatting of this note might be different from the original. Asthma Formatting of this note might be different from the original. Asthma Formatting of this note might be different from the original. Formatting of this note might be different from the original. Formatting of this note might be different from the original. Formatting of this note might be different from the original. Asthma Formatting of this note might be different from the original. Asthma Formatting of this note might be different from the original. Formatting of this note might be different from the original. Asthma Formatting of this note might be different from the original. Asthma      Obstructive sleep apnea   Hiatal hernia    Allergies  Allergen Reactions   Albuterol Sulfate Shortness Of Breath    Other reaction(s): Other (See Comments)  can't breathe  Other reaction(s): Other (See Comments) can't breathe   Tape     Other reaction(s): Other (See Comments), Other  (See Comments), Other (See Comments)  severe skin breakdown  Cast and Bandage Cover  severe skin breakdown  severe skin breakdown   Formoterol Other (See Comments)    Other reaction(s): Headaches  Other reaction(s): Headaches  Other reaction(s): Headaches  Headaches  Other reaction(s): Headaches Other reaction(s): Headaches    Other reaction(s): Headaches    Headaches   Hydrocodone Rash   Hydrocodone-Acetaminophen Rash   Nickel Rash and Other (See Comments)   Other Rash    cast and bandage use  cast and bandage use  cast and bandage use  cast and bandage use    cast and bandage use cast and bandage use   Oxycodone-Acetaminophen Other (See Comments)    headache   Salmeterol Rash   Strawberry Extract Hives  Full body rash from strawberry   Advair Hfa [Fluticasone-Salmeterol]    Asa [Aspirin]    Cleocin [Clindamycin]    Clindamycin/Lincomycin    Fludrocortisone Acetate    Gabapentin    Hydrochlorothiazide    Imipramine    Ipratropium Bromide    Medroxyprogesterone    Meloxicam    Metformin And Related    Nystatin    Penicillins    Pred Forte [Prednisolone]    Prednisone    Proventil Hfa [Albuterol]    Solu-Medrol [Methylprednisolone]    Sulfa Antibiotics    Tegaderm Ag Mesh [Silver]    Toradol [Ketorolac Tromethamine]    Tramadol     Immunization History  Administered Date(s) Administered   Hep A, Unspecified 09/09/2011, 10/15/2011   Hepatitis B, ADULT 09/09/2011, 10/15/2011   Influenza,inj,Quad PF,6+ Mos 04/19/2022   Influenza-Unspecified 05/03/2012    Past Medical History:  Diagnosis Date   Albuminuria 06/27/2015   Anxiety 11/04/2014   Arm DVT (deep venous thromboembolism), acute, left (HCC) 08/17/2022   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/30/2014   Asthma    Atopic dermatitis 10/30/2008   Formatting of this note might be different from the original. Atopic dermatitis Formatting of this note might be different from the  original. Formatting of this note might be different from the original. Atopic dermatitis Formatting of this note might be different from the original. Formatting of this note might be different from the original. Formatting of this note might be different from the or   Cataract 05/06/2015   Contusion of left wrist 07/08/2021   Crohn's disease (HCC) 04/21/2015   De Quervain's tenosynovitis, left 07/31/2021   Diabetes mellitus without complication (HCC)    Diabetes type 2, controlled (HCC) 04/21/2015   Dry eyes 05/06/2015   DVT (deep venous thrombosis) (HCC)    Dysfunction of both eustachian tubes 08/31/2012   Encounter for monitoring immunomodulating therapy 11/20/2021   Environmental allergies 08/12/2020   Esophageal candidiasis (HCC) 06/26/2022   Fibrositis 08/28/2014   Formatting of this note might be different from the original. Fibromyalgia Formatting of this note might be different from the original. Formatting of this note might be different from the original. Fibromyalgia   Flank pain 11/24/2020   Gastroesophageal reflux disease with esophagitis without hemorrhage 08/01/2019   Formatting of this note might be different from the original. Formatting of this note might be different from the original. 07/2019: chronic symptoms of esophageal reflux since prior to sleeve gastrectomy surgery. Symptoms have worsened over the last 6 months with recent EGD findings of grade B esophagitis, irregular z line, erythematous mucosa, and evidence of sleeve gastrectomy. 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 Formatting of this note might be different from the original. 07/2019: 8 mm colonic polyp removed during colonoscopy, biopsy showed serrated polyp.  Repeat colonoscopy recommended in one year. Formatting of this note might be different from the original. 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 Hypertension Formatting of this  note might be different from the original. Formatting of this note might be different from the original. 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 Keratoconus Formatting of this note might be different from the original. Formatting of this note might be different from the original. Keratoconus Formatting of this note might be different from the original. Formatting of this note might be different from the original. Keratoconus Formatting of this note might be different from the or   Microhematuria 06/27/2015   Migraine 08/28/2014   Formatting of this note might be different from the original. Migraine Formatting of this note might be different from the original. Formatting of this note might be different from the original. Migraine Formatting of this note might be different from the original. Migraine Formatting of this note might be different from the original. Migraine Formatting of this note might be different from the or   Migraine without aura and without status migrainosus, not intractable 08/28/2014   Formatting of this note might be different from the original. Formatting of this note might be different from the original. Formatting of this note might be different from the original. Migraine Formatting of this note might be different from the  original. Migraine Formatting of this note might be different from the original. Formatting of this note might be different from the original. Migraine F   Mild intermittent asthma without complication 10/30/2008   Formatting of this note might be different from the original. Formatting of this note might be different from the original. Formatting of this note might be different from the original. Asthma Formatting of this note might be different from the original. Asthma Formatting of this note might be different from the original. Formatting of this note might be different from the original. 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 Formatting of this note might be different from the original. 01/25/2016 with Dr. Dionisio David at [facility]. [place] of this note might be different from the original. 01/25/2016 with Dr. Dionisio David at [facility]. [place] of this note might be different from the original. Formatting of this note might be different from    Seronegative inflammatory arthritis 08/28/2014   Formatting of this note might be different from the original. Formatting of this note might be different from the original. Arthritis; diagnosed as IBD related - 2012 Formatting of this note might be different from the original.  Followed by rheumatologist. Had to change providers due to an insurance change earlier this year resulting in patient not being able to have Remicade for ~6 months. 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 Formatting of this  note might be different from the original. Treated with PRN miralax. Recent colonoscopy 07/2019. Formatting of this note might be different from the original. Treated with PRN miralax. Recent colonoscopy 07/2019. Formatting of this note might be different from the original. Formatting of this note might be different f   Status post placement of ureteral stent 02/06/2021   Tooth infection 08/12/2020   Ureteral stone 01/20/2022   Urinary tract infection, site not specified 06/26/2022    Tobacco History: Social History   Tobacco Use  Smoking Status Never  Smokeless Tobacco Never   Counseling given: Not Answered   Continue to not smoke  Outpatient Encounter Medications as of 12/20/2022  Medication Sig   acetaminophen (TYLENOL) 500 MG tablet Take 1,000 mg by mouth every 12 (twelve) hours as needed for mild pain or moderate pain.   albuterol (PROVENTIL) (2.5 MG/3ML) 0.083% nebulizer solution Take 3 mLs (2.5 mg total) by nebulization every 6 (six) hours as needed for wheezing or shortness of breath.   ALBUTEROL SULFATE ER PO Take 1 puff by mouth every 4 (four) hours as needed (sob). Nebulizer   B Complex-C (B-COMPLEX WITH VITAMIN C) tablet Take 1 tablet by mouth daily.   Cholecalciferol (VITAMIN D3) 50 MCG (2000 UT) TABS Take 1 tablet by mouth daily.   esomeprazole (NEXIUM) 40 MG capsule Take 1 capsule (40 mg total) by mouth 2 (two) times daily before a meal.   famotidine (PEPCID) 40 MG tablet Take 1 tablet (40 mg total) by mouth daily.   inFLIXimab (REMICADE) 100 MG injection Inject 100 mg into the vein as directed.   Loratadine-Pseudoephedrine (CLARITIN-D 24  HOUR PO) Take 1 tablet by mouth daily.   metoprolol tartrate (LOPRESSOR) 25 MG tablet Take 1 tablet (25 mg total) by mouth 2 (two) times daily.   mirabegron ER (MYRBETRIQ) 50 MG TB24 tablet Take 1 tablet (50 mg total) by mouth daily.   montelukast (SINGULAIR) 10 MG tablet Take 1 tablet (10 mg total) by mouth at bedtime.   Multiple Vitamin (MULTIVITAMIN ADULT PO) Take 1 tablet by mouth daily.   nitroGLYCERIN (NITROSTAT) 0.4 MG SL tablet Place 1 tablet (0.4 mg total) under the tongue every 5 (five) minutes as needed for chest pain.   urea (CARMOL) 10 % cream Apply topically as needed.   ZOLMitriptan (ZOMIG) 2.5 MG tablet Take 1 tablet (2.5 mg total) by mouth once for 1 dose. May repeat in 2 hours if headache persists or recurs.   No facility-administered encounter medications on file as of 12/20/2022.     Review of Systems  Review of Systems  No chest pain with exertion.  No orthopnea or PND.  Comprehensive review of systems otherwise negative. Physical Exam  BP 120/80 (BP Location: Left Arm)   Pulse 85   Ht 5\' 3"  (1.6 m)   Wt 228 lb (103.4 kg)   SpO2 100%   BMI 40.39 kg/m   Wt Readings from Last 5 Encounters:  12/20/22 228 lb (103.4 kg)  11/30/22 226 lb 6.4 oz (102.7 kg)  10/13/22 224 lb (101.6 kg)  10/05/22 231 lb 7.7 oz (105 kg)  10/01/22 231 lb 7.7 oz (105 kg)    BMI Readings from Last 5 Encounters:  12/20/22 40.39 kg/m  11/30/22 40.10 kg/m  10/13/22 39.68 kg/m  10/05/22 41.01 kg/m  10/01/22 41.01 kg/m     Physical Exam General: Sitting in chair, no acute distress Eyes: EOMI, no icterus Neck: Supple, no JVP Neck: Clear, normal work of breathing Cardiovascular: Warm,  no edema Abdomen: Nondistended, bowel sounds present MSK: No synovitis, no joint effusion Neuro: Normal gait, no weakness Psych: Normal mood, full affect   Assessment & Plan:   Severe persistent asthma: Due to multiple allergies due and active ingredients, only current treatment available is  albuterol nebulizer.  Allergic to an active ingredient in the prednisone as well.  Prior inhalers, HFA, DPI, other long-acting nebulizers have caused worsening symptoms.  As such, suspect she will require a trial of biologic therapy to see if we can make her symptoms better, if these do not yield results, not much more we can offer.  Eosinophils not significant elevated, as high as 200 so to consider IL-5 therapy.  No IgE level, will obtain today.  Suspect would recommend Xolair if elevated versus Tezspire given mild eosinophil elevation.  IL-5 therapy would be reasonable if fails either 2.  Risk factors uncontrolled asthma include hiatal hernia and refractory GERD, uncontrolled seasonal allergy symptoms.  OSA: Diagnosed in the past.  Failed home sleep test unable to obtain both in lab test.  Most recently was mild.  Not using CPAP since 2017.  Would benefit from repeat polysomnography, ordered today.   Return in about 3 months (around 03/22/2023) for f/u Dr. Judeth Horn.   Karren Burly, MD 12/20/2022   This appointment required 60 minutes of patient care (this includes precharting, chart review, review of results, face-to-face care, etc.).

## 2022-12-20 NOTE — Patient Instructions (Signed)
It is nice to meet you  Since the inhalers and nebulizers are not really an option, I think we should consider using biologic therapy.  These are injectables.  There are different than the Biologics for rheumatoid arthritis and target different pathways of inflammation that can cause asthma symptoms.  Will get lab work today, specifically an IgE level, to help determine which biologic treatment would be best for you.  Based on results I will let pharmacy team know and they will be in contact with you to arrange the proper medicine and the appointment is needed for the first dose etc.  Return to clinic in 3 months or sooner as needed with Dr. Judeth Horn

## 2022-12-21 LAB — IGE: IgE (Immunoglobulin E), Serum: 35 kU/L (ref <=114)

## 2022-12-22 ENCOUNTER — Other Ambulatory Visit (HOSPITAL_COMMUNITY): Payer: Self-pay

## 2022-12-22 ENCOUNTER — Telehealth: Payer: Self-pay | Admitting: Pulmonary Disease

## 2022-12-22 ENCOUNTER — Telehealth: Payer: Self-pay

## 2022-12-22 NOTE — Telephone Encounter (Signed)
Enrolled patient into Tezspire copay card:  Confirmation # [id number] ID: [id number] Group: [id number] BIN: [id number] PCN: [id number] Expiration: 12/20/25 Copay card phone: [phone] for additional support.  Patient scheduled for Tezspire new start on 01/10/2023. She is newly starting Simponi Aria infusion on 12/27/2022 so did not want to overlap new medication starts.  Chesley Mires, PharmD, MPH, BCPS, CPP Clinical Pharmacist (Rheumatology and Pulmonology)

## 2022-12-22 NOTE — Telephone Encounter (Addendum)
Submitted a Prior Authorization request to [facility] for TEZSPIRE via CoverMyMeds. Will update once we receive a response.  Key: [id number]   ----- Message from Karren Burly, MD sent at 12/22/2022  7:53 AM EDT ----- H/o asthma recurrent flares - allergic to basically all inhalers. Can we start process for Tezspire given traditional inhalers are not an option? Eos only 200 in the past, IgE WNL. Thanks!

## 2022-12-22 NOTE — Progress Notes (Signed)
Will start Tezspire BIV

## 2022-12-22 NOTE — Telephone Encounter (Signed)
[facility] for TEZSPIRE. Authorization has been APPROVED from 12/22/2022 to 12/22/2023. Approval letter sent to scan center.   Patient must fill through [facility]: [phone]   Authorization # [id number]   Will work on obtaining a copay card for pt.

## 2022-12-22 NOTE — Telephone Encounter (Signed)
We do not have cpt codes for DME companies for the equipment that they supply this has to go through the homecare company

## 2022-12-22 NOTE — Telephone Encounter (Signed)
Oleeta from [facility] calling for a CPT Code for the nebulizer for this PT. Please enter here and when she calls back I can give it to her. She has no call back #, she said.

## 2022-12-23 LAB — ALLERGEN PROFILE, PERENNIAL ALLERGEN IGE

## 2022-12-27 LAB — HM DIABETES EYE EXAM

## 2022-12-28 ENCOUNTER — Encounter: Payer: Self-pay | Admitting: Cardiology

## 2022-12-28 ENCOUNTER — Ambulatory Visit: Payer: 59 | Attending: Cardiology | Admitting: Cardiology

## 2022-12-28 VITALS — BP 140/90 | HR 94 | Ht 63.0 in | Wt 230.2 lb

## 2022-12-28 DIAGNOSIS — I10 Essential (primary) hypertension: Secondary | ICD-10-CM

## 2022-12-28 DIAGNOSIS — Z9884 Bariatric surgery status: Secondary | ICD-10-CM

## 2022-12-28 DIAGNOSIS — Z79899 Other long term (current) drug therapy: Secondary | ICD-10-CM | POA: Insufficient documentation

## 2022-12-28 DIAGNOSIS — D84821 Immunodeficiency due to drugs: Secondary | ICD-10-CM | POA: Insufficient documentation

## 2022-12-28 DIAGNOSIS — I2510 Atherosclerotic heart disease of native coronary artery without angina pectoris: Secondary | ICD-10-CM | POA: Insufficient documentation

## 2022-12-28 DIAGNOSIS — E7800 Pure hypercholesterolemia, unspecified: Secondary | ICD-10-CM | POA: Diagnosis not present

## 2022-12-28 DIAGNOSIS — I25118 Atherosclerotic heart disease of native coronary artery with other forms of angina pectoris: Secondary | ICD-10-CM | POA: Diagnosis not present

## 2022-12-28 HISTORY — DX: Atherosclerotic heart disease of native coronary artery without angina pectoris: I25.10

## 2022-12-28 MED ORDER — ATORVASTATIN CALCIUM 20 MG PO TABS: 20.0000 mg | ORAL_TABLET | Freq: Every day | ORAL | 3 refills | Status: DC

## 2022-12-28 MED ORDER — CLOPIDOGREL BISULFATE 75 MG PO TABS: 75.0000 mg | ORAL_TABLET | Freq: Every day | ORAL | 3 refills | Status: DC

## 2022-12-28 MED ORDER — ISOSORBIDE MONONITRATE ER 30 MG PO TB24: 30.0000 mg | ORAL_TABLET | Freq: Every day | ORAL | 3 refills | Status: DC

## 2022-12-28 NOTE — Addendum Note (Signed)
Addended by: Baldo Ash D on: 12/28/2022 04:31 PM   Modules accepted: Orders

## 2022-12-28 NOTE — Progress Notes (Signed)
Cardiology Office Note:    Date:  12/28/2022   ID:  Casey [name], DOB Jul 05, 1971, MRN [id number]  PCP:  Olive Bass, FNP  Cardiologist:  Gypsy Balsam, MD    Referring MD: Julianne Handler, NP   Chief Complaint  Patient presents with   Medication Management    History of Present Illness:    Casey [name] is a 52 y.o. female with past medical history significant for dyslipidemia, essential hypertension, status post laparoscopic sleeve gastric surgery done years ago she was referred to [place] because of chest pain which some suspicious characteristic coronary CT angio has been performed which showed moderate stenosis of proximal LAD, fractional flow reserve has been negative.  Echocardiogram showed preserved left ventricle ejection fraction.  She comes here to talk about this.  I put her last time on metoprolol which seems to be helping she does have less pain as well as shortness of breath is better but interesting side effect of metoprolol, she cannot fall asleep at evening time she thinks it is related to metoprolol.  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 Atopic dermatitis Formatting of this note might be different from the original. Formatting of this note might be different from the original. Atopic dermatitis Formatting of this note might be different from the original. Formatting of this note might be different from the original. Formatting of this note might be different from the or   Cataract 05/06/2015   Contusion of left wrist 07/08/2021   Crohn's disease (HCC) 04/21/2015   De Quervain's tenosynovitis, left 07/31/2021   Diabetes mellitus without complication (HCC)    Diabetes type 2,  controlled (HCC) 04/21/2015   Dry eyes 05/06/2015   DVT (deep venous thrombosis) (HCC)    Dysfunction of both eustachian tubes 08/31/2012   Encounter for monitoring immunomodulating therapy 11/20/2021   Environmental allergies 08/12/2020   Esophageal candidiasis (HCC) 06/26/2022   Fibrositis 08/28/2014   Formatting of this note might be different from the original. Fibromyalgia Formatting of this note might be different from the original. Formatting of this note might be different from the original. Fibromyalgia   Flank pain 11/24/2020   Gastroesophageal reflux disease with esophagitis without hemorrhage 08/01/2019   Formatting of this note might be different from the original. Formatting of this note might be different from the original. 07/2019: chronic symptoms of esophageal reflux since prior to sleeve gastrectomy surgery. Symptoms have worsened over the last 6 months with recent EGD findings of grade B esophagitis, irregular z line, erythematous mucosa, and evidence of sleeve gastrectomy. 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 Formatting of this note might be different from the original. 07/2019: 8 mm colonic polyp removed during colonoscopy, biopsy showed serrated polyp. Repeat colonoscopy recommended in one year. Formatting of this note might be different from the original. Added automatically from request for surgery [id number] Formatting of this note might b   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  History of kidney stones 08/12/2020   Hypercholesterolemia 10/07/2022   Hypertension, essential, benign 06/27/2015   Hypertensive disorder 08/28/2014   Formatting of this note might be different from the original. Hypertension Formatting of  this note might be different from the original. Formatting of this note might be different from the original. 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 Keratoconus Formatting of this note might be different from the original. Formatting of this note might be different from the original. Keratoconus Formatting of this note might be different from the original. Formatting of this note might be different from the original. Keratoconus Formatting of this note might be different from the or   Microhematuria 06/27/2015   Migraine 08/28/2014   Formatting of this note might be different from the original. Migraine Formatting of this note might be different from the original. Formatting of this note might be different from the original. Migraine Formatting of this note might be different from the original. Migraine Formatting of this note might be different from the original. Migraine Formatting of this note might be different from the or   Migraine without aura and without status migrainosus, not intractable 08/28/2014   Formatting of this note might be different from the original. Formatting of this note might be different from the original. Formatting of this note might be different from the original. Migraine Formatting of this note might be different from the original. Migraine Formatting of this note might be different from the original. Formatting of this note might be different from the original. Migraine F   Mild intermittent asthma without complication 10/30/2008   Formatting of this note might be different from the original. Formatting of this note might be different from the original. Formatting of this note might be different from the original. Asthma Formatting of this note might be different from the original. Asthma  Formatting of this note might be different from the original. Formatting of this note might be different from the original. 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 Formatting of this note might be different from the original. 01/25/2016 with Dr. Dionisio David at [facility]. [place] of this note might be different from the original. 01/25/2016 with Dr. Dionisio David at [facility]. [place] of this note might be different from the original. Formatting of this note might be different from    Seronegative inflammatory arthritis 08/28/2014   Formatting of this note might be different from the original. Formatting of this note might be different from the original. Arthritis; diagnosed as IBD related - 2012 Formatting of this note might be different from the original. Followed by rheumatologist. Had to change providers due to an insurance change earlier this year resulting in patient not being  able to have Remicade for ~6 months. 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 Formatting of this note might be different from the original. Treated with PRN miralax. Recent colonoscopy 07/2019. Formatting of this note might be different  from the original. Treated with PRN miralax. Recent colonoscopy 07/2019. Formatting of this note might be different from the original. Formatting of this note might be different f   Status post placement of ureteral stent 02/06/2021   Tooth infection 08/12/2020   Ureteral stone 01/20/2022   Urinary tract infection, site not specified 06/26/2022    Past Surgical History:  Procedure Laterality Date   BACK SURGERY     L5-S1   CERVICAL CONE BIOPSY  04/2000   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   EYE SURGERY     GASTRIC BYPASS  2017   Sleve   LAPAROSCOPIC GASTRIC SLEEVE RESECTION     Low back disc surgery     06/2000   TOTAL KNEE ARTHROPLASTY Right 11/2016    Current Medications: Current Meds  Medication Sig   acetaminophen (TYLENOL) 500 MG tablet Take 1,000 mg by mouth every 12 (twelve) hours as needed for mild pain or moderate pain.   albuterol (PROVENTIL) (2.5 MG/3ML) 0.083% nebulizer solution Take 3 mLs (2.5 mg total) by nebulization every 6 (six) hours as needed for wheezing or shortness of breath.   ALBUTEROL SULFATE ER PO Take 1 puff by mouth every 4 (four) hours as needed (sob). Nebulizer   B Complex-C (B-COMPLEX WITH VITAMIN C) tablet Take 1 tablet by mouth daily.   Cholecalciferol (VITAMIN D3) 50 MCG (2000 UT) TABS Take 1 tablet by mouth daily.   esomeprazole (NEXIUM) 40 MG capsule Take 1 capsule (40 mg total) by mouth 2 (two) times daily before a meal.   famotidine (PEPCID) 40 MG tablet Take 1 tablet (40 mg total) by mouth daily.   Golimumab (SIMPONI ARIA IV) Inject 200 mg into the vein every 8 (eight) weeks.   Loratadine-Pseudoephedrine (CLARITIN-D 24 HOUR PO) Take 1 tablet by mouth daily.   metoprolol tartrate (LOPRESSOR) 25 MG tablet Take 1 tablet (25 mg total) by mouth 2 (two) times daily.   mirabegron ER (MYRBETRIQ) 50 MG TB24 tablet Take 1 tablet (50 mg total) by mouth daily.   montelukast (SINGULAIR) 10 MG tablet Take 1 tablet (10 mg total) by mouth at bedtime.    Multiple Vitamin (MULTIVITAMIN ADULT PO) Take 1 tablet by mouth daily.   nitroGLYCERIN (NITROSTAT) 0.4 MG SL tablet Place 1 tablet (0.4 mg total) under the tongue every 5 (five) minutes as needed for chest pain.   urea (CARMOL) 10 % cream Apply topically as needed. (Patient taking differently: Apply 1 Application topically as needed.)   ZOLMitriptan (ZOMIG) 2.5 MG tablet Take 1 tablet (2.5 mg total) by mouth once for 1 dose. May repeat in 2 hours if headache persists or recurs.   [DISCONTINUED] inFLIXimab (REMICADE) 100 MG injection Inject 100 mg into the vein as directed.     Allergies:   Albuterol sulfate, Tape, Formoterol, Hydrocodone, Hydrocodone-acetaminophen, Nickel, Other, Oxycodone-acetaminophen, Salmeterol, Strawberry extract, Advair hfa [fluticasone-salmeterol], Asa [aspirin], Cleocin [clindamycin], Clindamycin/lincomycin, Fludrocortisone acetate, Gabapentin, Hydrochlorothiazide, Imipramine, Ipratropium bromide, Medroxyprogesterone, Meloxicam, Metformin and related, Nystatin, Penicillins, Pred forte [prednisolone], Prednisone, Proventil hfa [albuterol], Solu-medrol [methylprednisolone], Sulfa antibiotics, Tegaderm ag mesh [silver], Toradol [ketorolac tromethamine], and Tramadol   Social History   Socioeconomic History   Marital status:  Married    Spouse name: Not on file   Number of children: Not on file   Years of education: Not on file   Highest education level: Not on file  Occupational History   Not on file  Tobacco Use   Smoking status: Never   Smokeless tobacco: Never  Substance and Sexual Activity   Alcohol use: Never   Drug use: Never   Sexual activity: Yes  Other Topics Concern   Not on file  Social History Narrative   Not on file   Social Determinants of Health   Financial Resource Strain: Not on file  Food Insecurity: Not on file  Transportation Needs: Not on file  Physical Activity: Not on file  Stress: Not on file  Social Connections: Not on file      Family History: The patient's family history includes Cancer in an other family member. ROS:   Please see the history of present illness.    All 14 point review of systems negative except as described per history of present illness  EKGs/Labs/Other Studies Reviewed:      Recent Labs: 11/30/2022: ALT 12; BUN 8; Creatinine, Ser 0.80; Hemoglobin 15.4; Platelets 342.0; Potassium 3.5; Sodium 140; TSH 0.52  Recent Lipid Panel No results found for: "CHOL", "TRIG", "HDL", "CHOLHDL", "VLDL", "LDLCALC", "LDLDIRECT"  Physical Exam:    VS:  BP (!) 140/90 (BP Location: Left Arm, Patient Position: Sitting)   Pulse 94   Ht 5\' 3"  (1.6 m)   Wt 230 lb 3.2 oz (104.4 kg)   SpO2 98%   BMI 40.78 kg/m     Wt Readings from Last 3 Encounters:  12/28/22 230 lb 3.2 oz (104.4 kg)  12/20/22 228 lb (103.4 kg)  11/30/22 226 lb 6.4 oz (102.7 kg)     GEN:  Well nourished, well developed in no acute distress HEENT: Normal NECK: No JVD; No carotid bruits LYMPHATICS: No lymphadenopathy CARDIAC: RRR, no murmurs, no rubs, no gallops RESPIRATORY:  Clear to auscultation without rales, wheezing or rhonchi  ABDOMEN: Soft, non-tender, non-distended MUSCULOSKELETAL:  No edema; No deformity  SKIN: Warm and dry LOWER EXTREMITIES: no swelling NEUROLOGIC:  Alert and oriented x 3 PSYCHIATRIC:  Normal affect   ASSESSMENT:    1. Coronary artery disease of native artery of native heart with stable angina pectoris (HCC)   2. Hypertension, essential, benign   3. Hypercholesterolemia   4. S/P laparoscopic sleeve gastrectomy    PLAN:    In order of problems listed above:  Moderate coronary artery disease in proximal LAD.  I will initiate clopidogrel, she is allergic to aspirin, 75 mg daily, then if evidence 5 we will discontinue her metoprolol and start her on clopidogrel 75 mg daily.  And after that I will Lipitor 20 mg daily, fasting lipid profile is TLT will be done within the next 6 weeks. Dyslipidemia I did  review her K PN which show me total cholesterol 220 HDL 61 again plan will be to initiate Lipitor. Status post sleeve gastrectomy.  Noted.   Medication Adjustments/Labs and Tests Ordered: Current medicines are reviewed at length with the patient today.  Concerns regarding medicines are outlined above.  No orders of the defined types were placed in this encounter.  Medication changes: No orders of the defined types were placed in this encounter.   Signed, Georgeanna Lea, MD, [facility] 12/28/2022 4:14 PM    [facility]

## 2022-12-28 NOTE — Patient Instructions (Signed)
Medication Instructions:   START: Plavix 75mg  1 tablet daily---THEN IN 3-4 DAYS  STOP: Metoprolol   START: Imdur 30mg  1 tablet daily THEN IN 2 days  START: Lipitor 20mg  1 tablet in the evening   Lab Work: Your physician recommends that you return for lab work in: 6 weeks You need to have labs done when you are fasting.  You can come Monday through Friday 8:30 am to 12:00 pm and 1:15 to 4:30. You do not need to make an appointment as the order has already been placed. The labs you are going to have done are Lipids.    Testing/Procedures: None Ordered   Follow-Up: At [facility], you and your health needs are our priority.  As part of our continuing mission to provide you with exceptional heart care, we have created designated Provider Care Teams.  These Care Teams include your primary Cardiologist (physician) and Advanced Practice Providers (APPs -  Physician Assistants and Nurse Practitioners) who all work together to provide you with the care you need, when you need it.  We recommend signing up for the patient portal called "MyChart".  Sign up information is provided on this After Visit Summary.  MyChart is used to connect with patients for Virtual Visits (Telemedicine).  Patients are able to view lab/test results, encounter notes, upcoming appointments, etc.  Non-urgent messages can be sent to your provider as well.   To learn more about what you can do with MyChart, go to [email or web address].    Your next appointment:   5 month(s)  The format for your next appointment:   In Person  Provider:   Gypsy Balsam, MD    Other Instructions NA

## 2023-01-02 DIAGNOSIS — B370 Candidal stomatitis: Secondary | ICD-10-CM | POA: Insufficient documentation

## 2023-01-07 ENCOUNTER — Telehealth: Payer: Self-pay | Admitting: Pulmonary Disease

## 2023-01-07 NOTE — Progress Notes (Deleted)
HPI Patient presents today to [facility] to see pharmacy team for [facility] new start.  Past medical history includes ***  Number of hospitalizations in past year: Number of ***COPD/asthma exacerbations in past year:   Respiratory Medications Current regimen: *** Tried in past: *** Patient reports {Adherence challenges yes no:3044014::"adherence challenges","no known adherence challenges"}  OBJECTIVE Allergies  Allergen Reactions   Albuterol Sulfate Shortness Of Breath    Other reaction(s): Other (See Comments)  can't breathe  Other reaction(s): Other (See Comments) can't breathe   Tape     Other reaction(s): Other (See Comments), Other (See Comments), Other (See Comments)  severe skin breakdown  Cast and Bandage Cover  severe skin breakdown  severe skin breakdown   Formoterol Other (See Comments)    Other reaction(s): Headaches  Other reaction(s): Headaches  Other reaction(s): Headaches  Headaches  Other reaction(s): Headaches Other reaction(s): Headaches    Other reaction(s): Headaches    Headaches   Hydrocodone Rash   Hydrocodone-Acetaminophen Rash   Nickel Rash and Other (See Comments)   Other Rash    cast and bandage use  cast and bandage use  cast and bandage use  cast and bandage use    cast and bandage use cast and bandage use   Oxycodone-Acetaminophen Other (See Comments)    headache   Salmeterol Rash   Strawberry Extract Hives    Full body rash from strawberry   Advair Hfa [Fluticasone-Salmeterol]    Asa [Aspirin]    Cleocin [Clindamycin]    Clindamycin/Lincomycin    Fludrocortisone Acetate    Gabapentin    Hydrochlorothiazide    Imipramine    Ipratropium Bromide    Medroxyprogesterone    Meloxicam    Metformin And Related    Nystatin    Penicillins    Pred Forte [Prednisolone]    Prednisone    Proventil Hfa [Albuterol]    Solu-Medrol [Methylprednisolone]    Sulfa Antibiotics    Tegaderm Ag Mesh [Silver]    Toradol  [Ketorolac Tromethamine]    Tramadol     Outpatient Encounter Medications as of 01/10/2023  Medication Sig   acetaminophen (TYLENOL) 500 MG tablet Take 1,000 mg by mouth every 12 (twelve) hours as needed for mild pain or moderate pain.   albuterol (PROVENTIL) (2.5 MG/3ML) 0.083% nebulizer solution Take 3 mLs (2.5 mg total) by nebulization every 6 (six) hours as needed for wheezing or shortness of breath.   ALBUTEROL SULFATE ER PO Take 1 puff by mouth every 4 (four) hours as needed (sob). Nebulizer   atorvastatin (LIPITOR) 20 MG tablet Take 1 tablet (20 mg total) by mouth daily.   B Complex-C (B-COMPLEX WITH VITAMIN C) tablet Take 1 tablet by mouth daily.   Cholecalciferol (VITAMIN D3) 50 MCG (2000 UT) TABS Take 1 tablet by mouth daily.   clopidogrel (PLAVIX) 75 MG tablet Take 1 tablet (75 mg total) by mouth daily.   esomeprazole (NEXIUM) 40 MG capsule Take 1 capsule (40 mg total) by mouth 2 (two) times daily before a meal.   famotidine (PEPCID) 40 MG tablet Take 1 tablet (40 mg total) by mouth daily.   Golimumab (SIMPONI ARIA IV) Inject 200 mg into the vein every 8 (eight) weeks.   isosorbide mononitrate (IMDUR) 30 MG 24 hr tablet Take 1 tablet (30 mg total) by mouth daily.   Loratadine-Pseudoephedrine (CLARITIN-D 24 HOUR PO) Take 1 tablet by mouth daily.   mirabegron ER (MYRBETRIQ) 50 MG TB24 tablet Take 1 tablet (50 mg total) by  mouth daily.   montelukast (SINGULAIR) 10 MG tablet Take 1 tablet (10 mg total) by mouth at bedtime.   Multiple Vitamin (MULTIVITAMIN ADULT PO) Take 1 tablet by mouth daily.   nitroGLYCERIN (NITROSTAT) 0.4 MG SL tablet Place 1 tablet (0.4 mg total) under the tongue every 5 (five) minutes as needed for chest pain.   urea (CARMOL) 10 % cream Apply topically as needed. (Patient taking differently: Apply 1 Application topically as needed.)   ZOLMitriptan (ZOMIG) 2.5 MG tablet Take 1 tablet (2.5 mg total) by mouth once for 1 dose. May repeat in 2 hours if headache persists  or recurs.   No facility-administered encounter medications on file as of 01/10/2023.     Immunization History  Administered Date(s) Administered   Hep A, Unspecified 09/09/2011, 10/15/2011   Hepatitis B, ADULT 09/09/2011, 10/15/2011   Influenza,inj,Quad PF,6+ Mos 04/19/2022   Influenza-Unspecified 05/03/2012     PFTs     No data to display           Eosinophils Most recent blood eosinophil count was *** cells/microL taken on ***.   IgE: *** on ***   Assessment   Biologics training for tezepulumab Dorothea Ogle)  Goals of therapy: Mechanism: human monoclonal IgG2? antibody that binds to TSLP. This blocks TSLP from its effect on inflammation including reduce eosinophils, IgE, FeNO, IL-5, and IL-13. Mechanism is not definitively established. Reviewed that Dorothea Ogle is add-on medication and patient must continue maintenance inhaler regimen. Response to therapy: may take 3-4 months to determine efficacy.  Side effects: injection site reaction (6-18%), antibody development (2%), arthralgia (4%), back pain (4%), pharyngitis (4%)  Dose: Tezspire 210 mg once every 4 weeks  Administration/Storage:  Reviewed administration sites of thigh or abdomen (at least 2-3 inches away from abdomen). Reviewed the upper arm is only appropriate if caregiver is administering injection  Do not shake pen/syringe as this could lead to product foaming or precipitation. Do not shake syringe as this could lead to product foaming or precipitation.  Access: Approval of Tezspire through: {specialtycoverage:25706} Patient enrolled into copay card program to help with copay assistance.  Patient self-administered Tezspire 210mg /1.91 ml in {injsitedsg:28167} using sample Tezspire 210mg /1.91 ml Autoinjector pen NDC: *** Lot: *** Expiration: ***  Patient monitored for 30 minutes for adverse reaction.  Patient tolerated ***. Injection site checked and no ***.  Medication Reconciliation  A drug regimen  assessment was performed, including review of allergies, interactions, disease-state management, dosing and immunization history. Medications were reviewed with the patient, including name, instructions, indication, goals of therapy, potential side effects, importance of adherence, and safe use.  Drug interaction(s): ***  Immunizations  Patient is indicated for the influenzae, pneumonia, and shingles vaccinations. Patient has received *** COVID19 vaccines.   PLAN Continue Tezspire 210mg  SQ every 28 days.  Rx sent to: {SpecialtyPharmacies:25705}.   Continue maintenance asthma regimen of: ***  All questions encouraged and answered.  Instructed patient to reach out with any further questions or concerns.  Thank you for allowing pharmacy to participate in this patient's care.  This appointment required *** minutes of patient care (this includes precharting, chart review, review of results, face-to-face care, etc.).

## 2023-01-07 NOTE — Telephone Encounter (Signed)
[employer] denied this patient's authorization request for in lab sleep study.   They do not believe this is medically necessary with the clinicals notes, a home sleep study may be completed or P2P to get this approved within 21 days.   Please advise on your next steps, Thanks.

## 2023-01-10 ENCOUNTER — Telehealth: Payer: Self-pay

## 2023-01-10 ENCOUNTER — Telehealth: Payer: Self-pay | Admitting: Pulmonary Disease

## 2023-01-10 ENCOUNTER — Other Ambulatory Visit: Payer: 59 | Admitting: Pharmacist

## 2023-01-11 NOTE — Telephone Encounter (Addendum)
Spoke with patient - she has follow-up with ID this afternoon at [facility] (completed 7 days of IV antifungal treatment). Will f/u with her later this week to determine if we can start Tezspire. If further ID treatment needed, it is best to defer Tezspire initiation as she has just initiated Simponi Aria infusions last motnh  Chesley Mires, PharmD, MPH, BCPS, CPP Clinical Pharmacist (Rheumatology and Pulmonology)

## 2023-01-11 NOTE — Telephone Encounter (Signed)
Spoke with patient - she has follow-up with ID this afternoon at [facility]. Will f/u with her later this week to determine if we can start Dupixent. If further ID treatment needed, it is best to defer Dupixent initiation as she has just initiated Simponi Aria infusions last motnh  Chesley Mires, PharmD, MPH, BCPS, CPP Clinical Pharmacist (Rheumatology and Pulmonology)

## 2023-01-16 NOTE — Telephone Encounter (Signed)
Would like to do peer to peer she has been unable to complete home sleep studies

## 2023-01-17 NOTE — Telephone Encounter (Signed)
Spoke to patient about follow-up with ID at [facility]. Per her Dr. she will be on antifungal treatment until the end of the month and will have another follow-up appointment then to see next steps in treatment. Patient says that she will reach out to [place] once she is cleared to start Tezspire.

## 2023-01-18 ENCOUNTER — Encounter: Payer: Self-pay | Admitting: Allergy

## 2023-01-18 ENCOUNTER — Ambulatory Visit (INDEPENDENT_AMBULATORY_CARE_PROVIDER_SITE_OTHER): Payer: 59 | Admitting: Allergy

## 2023-01-18 VITALS — BP 108/64 | HR 88 | Temp 98.7°F | Resp 16 | Ht 63.0 in | Wt 230.0 lb

## 2023-01-18 DIAGNOSIS — B3781 Candidal esophagitis: Secondary | ICD-10-CM

## 2023-01-18 DIAGNOSIS — Z91038 Other insect allergy status: Secondary | ICD-10-CM

## 2023-01-18 DIAGNOSIS — T7800XD Anaphylactic reaction due to unspecified food, subsequent encounter: Secondary | ICD-10-CM

## 2023-01-18 DIAGNOSIS — J3089 Other allergic rhinitis: Secondary | ICD-10-CM | POA: Diagnosis not present

## 2023-01-18 DIAGNOSIS — H1013 Acute atopic conjunctivitis, bilateral: Secondary | ICD-10-CM | POA: Diagnosis not present

## 2023-01-18 DIAGNOSIS — M069 Rheumatoid arthritis, unspecified: Secondary | ICD-10-CM

## 2023-01-18 MED ORDER — EPINEPHRINE 0.3 MG/0.3ML IJ SOAJ: 0.3000 mg | INTRAMUSCULAR | 2 refills | Status: DC | PRN

## 2023-01-18 NOTE — Patient Instructions (Addendum)
-   Will obtain following labs: environmental allergy panel, stinging insect panel, strawberry.  If labwork is positive then may not need skin testing visit next week. - Stop taking: Claritin-D for now.   - Continue with: Singulair (montelukast) 10mg  daily - Try Allegra and Xyzal daily one at a time.  If you find either to be effective for allergy symptom control then would use daily to replace Claritin-D use.   For itchy/watery eyes if able you can try Pataday 1 drop each eye daily as needed for itchy/watery eyes.  - Continue avoidance of strawberry and stinging insects - Have access to self-injectable epinephrine (Epipen) 0.3mg  at all times - Follow emergency action plan in case of allergic reaction  - Continue as needed use of albuterol 1 vial via nebulizer - Plan to start Tezspire with Dr Judeth Horn after esophageal fungal infection has resolved  - Continue direction and follow-up care with your infectious disease doctor - Continue direction and follow-up care with your rheumatologist for RA management  Follow-up in 3-4 months or sooner if needed Keep next week skin testing if blood-work is negative

## 2023-01-18 NOTE — Progress Notes (Signed)
New Patient Note  RE: Casey [name] MRN: [id number] DOB: July 25, 1971 Date of Office Visit: 01/18/2023  Primary care provider: Olive Bass, FNP  Chief Complaint: allergies  History of present illness: Casey [name] is a 52 y.o. female presenting today for evaluation of allergic rhinitis.    She reports symptoms of PND, nasal congestion, itchy/watery eyes, throat clearing, sneezing, ear fullness and ears feels full of fluid. Pollen bothers her.  She is on singulair and claritin-d since she moved here 2.5 years ago.  She moved here from [place] and she feels her allergies have been much worse here. She has tried zyrtec-d as well in the past.  She does feel claritin-d has worked the best.  She not tried Care[employer] adviser or Xyzal yet.  She states nose sprays "prompt migraines" that she does not use these medications. If eyes are "super bad" she will use an OTC eye drop but she has had 2 corneal transplants so she tries to not have to use any eyedrops.  She has been thinking of getting the nasal lavage as she can't angle the nasal saline rinse device to get it to flow appropriately. She has had allergy testing years ago when she lived in [place].  She states she was on allergy shots for dog for couple months only. This was about 20 years ago.    She has a venom allergy as well. She has not been stung in years.   When she was a kid she "almost went into anaphylaxis".  She state she "completely swelled up".  She had to go to ED.  She had a epipen years ago.    With strawberry she states as a child she would get rash if she would use strawberry flavored products like a hair product.  She state she never liked the taste of strawberries. She avoids.   She reports having history of eczema but it is mild but more related to flares with her RA.   She is following with Dr Judeth Horn and pulmonology for asthma.  She does not tolerate albuterol HFA but does tolerate albuterol via nebulizer. She also has  not been able to tolerate many different ICS/LABA  due to active ingredients in the [facility] and DPIs.  Also has not tolerated nebulized ICS/LABA options either.  She has also noted issues with prednisone use.  Due to inability to tolerate traditional allergy medications biologic agents were discussed and she does have Tezspire approved but has not started as on hold right now due to the issues in the esophagus (as below).   Chest x-ray 09/2022 was unremarkable. She had a CT chest PE protocol 09/2022 reveals clear lungs, no acute PE, moderate hiatal hernia, coronary artery calcifications.   She has been to ID clinic for fungal infection for recurrent esophageal candidiasis.  She has had EGD last in April 2024 that was notable for mild esophageal dilation, esophageal plaque were concerning for candidiasis.  She has been treated with fluconazole rounds for the recurrence.  When she is in a recurrent she does not mild discomfort. She was hospitalized from 5/26 - 01/08/2023 for odynophagia treated with IV fluconazole. She was in the ED on 01/10/23 and had CT done showing per report fluid-filled distal esophagus. A throat swab from May 2024 grew candida albicans.   She was on oral diflucan now however at last ID visit it was planned to change to oral posaconzole due to suboptimal response to fluconazole.  She has close follow-up  with ID.   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     Review of systems: Review of Systems  Constitutional: Negative.   HENT:         See HPI  Eyes:        See HPI  Respiratory: Negative.    Cardiovascular: Negative.   Gastrointestinal:        See HPI  Musculoskeletal: Negative.   Skin: Negative.   Allergic/Immunologic: Negative.   Neurological: Negative.     All other systems negative unless noted above in HPI  Past medical history: Past Medical History:  Diagnosis Date   Albuminuria 06/27/2015   Anxiety 11/04/2014   Arm DVT (deep venous thromboembolism), acute, left  (HCC) 08/17/2022   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/30/2014   Asthma    Atopic dermatitis 10/30/2008   Formatting of this note might be different from the original. Atopic dermatitis Formatting of this note might be different from the original. Formatting of this note might be different from the original. Atopic dermatitis Formatting of this note might be different from the original. Formatting of this note might be different from the original. Formatting of this note might be different from the or   Cataract 05/06/2015   Contusion of left wrist 07/08/2021   Crohn's disease (HCC) 04/21/2015   De Quervain's tenosynovitis, left 07/31/2021   Diabetes mellitus without complication (HCC)    Diabetes type 2, controlled (HCC) 04/21/2015   Dry eyes 05/06/2015   DVT (deep venous thrombosis) (HCC)    Dysfunction of both eustachian tubes 08/31/2012   Encounter for monitoring immunomodulating therapy 11/20/2021   Environmental allergies 08/12/2020   Esophageal candidiasis (HCC) 06/26/2022   Fibrositis 08/28/2014   Formatting of this note might be different from the original. Fibromyalgia Formatting of this note might be different from the original. Formatting of this note might be different from the original. Fibromyalgia   Flank pain 11/24/2020   Gastroesophageal reflux disease with esophagitis without hemorrhage 08/01/2019   Formatting of this note might be different from the original. Formatting of this note might be different from the original. 07/2019: chronic symptoms of esophageal reflux since prior to sleeve gastrectomy surgery. Symptoms have worsened over the last 6 months with recent EGD findings of grade B esophagitis, irregular z line, erythematous mucosa, and evidence of sleeve gastrectomy. Biopsies with ch   Glaucoma suspect of both eyes 05/07/2016   Hematochezia 03/18/2016   Hematuria 06/18/2015   Hiatal hernia 10/07/2022   History of abnormal cervical Pap smear  11/29/2014   History of colon polyps 06/19/2019   Formatting of this note might be different from the original. Formatting of this note might be different from the original. 07/2019: 8 mm colonic polyp removed during colonoscopy, biopsy showed serrated polyp. Repeat colonoscopy recommended in one year. Formatting of this note might be different from the original. Added automatically from request for surgery [phone] Formatting of this note might b   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   History of kidney stones 08/12/2020   Hypercholesterolemia 10/07/2022   Hypertension, essential, benign 06/27/2015   Hypertensive disorder 08/28/2014   Formatting of this note might be different from the original. Hypertension Formatting of this note might be different from the original. Formatting of this note might be different from the original. 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 Keratoconus Formatting of this note might be different from the original. Formatting of this note might be different from the original. Keratoconus Formatting of this note might be different from the original. Formatting of this note might be different from the original. Keratoconus Formatting of this note might be different from the or   Microhematuria 06/27/2015   Migraine 08/28/2014   Formatting of this note might be different from the original. Migraine Formatting of this note might be different from the original. Formatting of this note might be different from the original. Migraine Formatting of this note might be different from the original. Migraine Formatting of this note might be different from the original. Migraine Formatting of this note might be different from the or   Migraine without aura and without status migrainosus, not intractable  08/28/2014   Formatting of this note might be different from the original. Formatting of this note might be different from the original. Formatting of this note might be different from the original. Migraine Formatting of this note might be different from the original. Migraine Formatting of this note might be different from the original. Formatting of this note might be different from the original. Migraine F   Mild intermittent asthma without complication 10/30/2008   Formatting of this note might be different from the original. Formatting of this note might be different from the original. Formatting of this note might be different from the original. Asthma Formatting of this note might be different from the original. Asthma Formatting of this note might be different from the original. Formatting of this note might be different from the original. 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 Formatting of this note might be different from the original. 01/25/2016 with Dr. Dionisio David at [facility]. [place] of this note might be different from the original. 01/25/2016 with Dr. Dionisio David at [facility]. [place] of this note might be different from the original. Formatting of this note might be different from    Seronegative  inflammatory arthritis 08/28/2014   Formatting of this note might be different from the original. Formatting of this note might be different from the original. Arthritis; diagnosed as IBD related - 2012 Formatting of this note might be different from the original. Followed by rheumatologist. Had to change providers due to an insurance change earlier this year resulting in patient not being able to have Remicade for ~6 months. 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 Formatting of this note might be different from the original. Treated with PRN miralax. Recent colonoscopy 07/2019. Formatting of this note might be different from the original. Treated with PRN miralax. Recent colonoscopy 07/2019. Formatting of this note might be different from the original. Formatting of this note might be  different f   Status post placement of ureteral stent 02/06/2021   Tooth infection 08/12/2020   Ureteral stone 01/20/2022   Urinary tract infection, site not specified 06/26/2022    Past surgical history: Past Surgical History:  Procedure Laterality Date   BACK SURGERY     L5-S1   CERVICAL CONE BIOPSY  04/2000   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   EYE SURGERY     GASTRIC BYPASS  2017   Sleve   LAPAROSCOPIC GASTRIC SLEEVE RESECTION     Low back disc surgery     06/2000   TOTAL KNEE ARTHROPLASTY Right 11/2016    Family history:  Family History  Adopted: Yes  Problem Relation Age of Onset   Cancer Other    Asthma Neg Hx    Allergic rhinitis Neg Hx    Atopy Neg Hx    Eczema Neg Hx     Social history: Lives in a home with carpeting in the bedroom with electric heating and central cooling.  2 dogs in the home.  There is no concern for roaches in the home.  She is a Mudlogger.  She works in [employer].  She does not report a smoking history.   Medication  List: Current Outpatient Medications  Medication Sig Dispense Refill   acetaminophen (TYLENOL) 500 MG tablet Take 1,000 mg by mouth every 12 (twelve) hours as needed for mild pain or moderate pain.     albuterol (PROVENTIL) (2.5 MG/3ML) 0.083% nebulizer solution Take 3 mLs (2.5 mg total) by nebulization every 6 (six) hours as needed for wheezing or shortness of breath. 150 mL 1   atorvastatin (LIPITOR) 20 MG tablet Take 1 tablet (20 mg total) by mouth daily. 90 tablet 3   B Complex-C (B-COMPLEX WITH VITAMIN C) tablet Take 1 tablet by mouth daily.     Cholecalciferol (VITAMIN D3) 50 MCG (2000 UT) TABS Take 1 tablet by mouth daily.     clopidogrel (PLAVIX) 75 MG tablet Take 1 tablet (75 mg total) by mouth daily. 90 tablet 3   EPINEPHrine (EPIPEN 2-PAK) 0.3 mg/0.3 mL IJ SOAJ injection Inject 0.3 mg into the muscle as needed for anaphylaxis. 2 each 2   esomeprazole (NEXIUM) 40 MG capsule Take 1 capsule (40 mg total) by mouth 2 (two) times daily before a meal. 60 capsule 11   famotidine (PEPCID) 40 MG tablet Take 1 tablet (40 mg total) by mouth daily. 30 tablet 11   Golimumab (SIMPONI ARIA IV) Inject 200 mg into the vein every 8 (eight) weeks.     isosorbide mononitrate (IMDUR) 30 MG 24 hr tablet Take 1 tablet (30 mg total) by mouth daily. 90 tablet 3   Loratadine-Pseudoephedrine (CLARITIN-D 24 HOUR PO) Take 1 tablet by mouth daily.     mirabegron ER (MYRBETRIQ) 50 MG TB24 tablet Take 1 tablet (50 mg total) by mouth daily. 30 tablet 11   montelukast (SINGULAIR) 10 MG tablet Take 1 tablet (10 mg total) by mouth at bedtime. 30 tablet 11   nitroGLYCERIN (NITROSTAT) 0.4 MG SL tablet Place 1 tablet (0.4 mg total) under the tongue every 5 (five) minutes as needed for chest pain. 25 tablet 6   posaconazole (NOXAFIL) 100 MG TBEC delayed-release tablet Take 300 mg by mouth daily.     ZOLMitriptan (ZOMIG) 2.5 MG tablet Take 1 tablet (2.5 mg total) by mouth once for 1 dose. May repeat in 2 hours if headache  persists or recurs. 10 tablet 1   No  current facility-administered medications for this visit.    Known medication allergies: Allergies  Allergen Reactions   Albuterol Sulfate Shortness Of Breath    Other reaction(s): Other (See Comments)  can't breathe  Other reaction(s): Other (See Comments) can't breathe   Tape     Other reaction(s): Other (See Comments), Other (See Comments), Other (See Comments)  severe skin breakdown  Cast and Bandage Cover  severe skin breakdown  severe skin breakdown   Formoterol Other (See Comments)    Other reaction(s): Headaches  Other reaction(s): Headaches  Other reaction(s): Headaches  Headaches  Other reaction(s): Headaches Other reaction(s): Headaches    Other reaction(s): Headaches    Headaches   Hydrocodone Rash   Hydrocodone-Acetaminophen Rash   Nickel Rash and Other (See Comments)   Other Rash    cast and bandage use  cast and bandage use  cast and bandage use  cast and bandage use    cast and bandage use cast and bandage use   Oxycodone-Acetaminophen Other (See Comments)    headache   Salmeterol Rash   Strawberry Extract Hives    Full body rash from strawberry   Advair Hfa [Fluticasone-Salmeterol]    Asa [Aspirin]    Cleocin [Clindamycin]    Clindamycin/Lincomycin    Fludrocortisone Acetate    Gabapentin    Hydrochlorothiazide    Imipramine    Ipratropium Bromide    Medroxyprogesterone    Meloxicam    Metformin And Related    Nystatin    Penicillins    Pred Forte [Prednisolone]    Prednisone    Proventil Hfa [Albuterol]    Solu-Medrol [Methylprednisolone]    Sulfa Antibiotics    Tegaderm Ag Mesh [Silver]    Toradol [Ketorolac Tromethamine]    Tramadol      Physical examination: Blood pressure 108/64, pulse 88, temperature 98.7 F (37.1 C), temperature source Temporal, resp. rate 16, height 5\' 3"  (1.6 m), weight 230 lb (104.3 kg), SpO2 98 %.  General: Alert, interactive, in no acute distress. HEENT:  PERRLA, TMs pearly gray, turbinates moderately edematous without discharge, post-pharynx non erythematous. Neck: Supple without lymphadenopathy. Lungs: Clear to auscultation without wheezing, rhonchi or rales. {no increased work of breathing. CV: Normal S1, S2 without murmurs. Abdomen: Nondistended, nontender. Skin: Warm and dry, without lesions or rashes. Extremities:  No clubbing, cyanosis or edema. Neuro:   Grossly intact.  Diagnositics/Labs: None today  Assessment and plan: Rhinoconjunctivitis, presumed allergic  - Will obtain following labs: environmental allergy panel, stinging insect panel, strawberry.  If labwork is positive then may not need skin testing visit next week. - Stop taking: Claritin-D for now.   - Continue with: Singulair (montelukast) 10mg  daily - Try Allegra and Xyzal daily one at a time.  If you find either to be effective for allergy symptom control then would use daily to replace Claritin-D use.   For itchy/watery eyes if able you can try Pataday 1 drop each eye daily as needed for itchy/watery eyes.  Hymenoptera allergy and food allergy - Continue avoidance of strawberry and stinging insects - Have access to self-injectable epinephrine (Epipen) 0.3mg  at all times - Follow emergency action plan in case of allergic reaction  Asthma - Continue as needed use of albuterol 1 vial via nebulizer - Plan to start Tezspire with Dr Judeth Horn after esophageal fungal infection has resolved  Recurrent esophageal candidiasis Rheumatoid arthritis - Continue direction and follow-up care with your infectious disease doctor - Continue direction and follow-up care with your rheumatologist for  RA management  Follow-up in 3-4 months or sooner if needed Keep next week skin testing if blood-work is negative   I appreciate the opportunity to take part in Casey [name]'s care. Please do not hesitate to contact me with questions.  Sincerely,   Margo Aye,  MD Allergy/Immunology [facility]

## 2023-01-18 NOTE — Telephone Encounter (Signed)
Completed an appeal for this pt's authorization with more notes added. [employer] will call if needed to speak with provider   Appeal Confirmation #:  [id number] Submission date: 01/18/2023  Will keep you posted MH   Thanks

## 2023-01-21 ENCOUNTER — Telehealth: Payer: Self-pay | Admitting: Cardiology

## 2023-01-21 LAB — ALLERGENS W/TOTAL IGE AREA 2
Alternaria Alternata IgE: <0.1 kU/L
Bermuda Grass IgE: <0.1 kU/L
IgE (Immunoglobulin E), Serum: 39 IU/mL (ref 6–495)
Oak, White IgE: <0.1 kU/L
Penicillium Chrysogen IgE: <0.1 kU/L
Pigweed, Rough IgE: <0.1 kU/L
White Mulberry IgE: <0.1 kU/L

## 2023-01-21 LAB — ALLERGEN HYMENOPTERA PANEL

## 2023-01-21 NOTE — Telephone Encounter (Signed)
Pt c/o medication issue:  1. Name of Medication:   atorvastatin (LIPITOR) 20 MG tablet    2. How are you currently taking this medication (dosage and times per day)?   Take 1 tablet (20 mg total) by mouth daily.    3. Are you having a reaction (difficulty breathing--STAT)? No  4. What is your medication issue? Pt states that she is not able to take medication right now being that she is currently on a Anti Fungal medication. Pt says she does not know how long she will be on the medication but since not being able to take above med she has began to have headaches. She would like a callback regarding this matter. Please advise

## 2023-01-21 NOTE — Telephone Encounter (Signed)
Spoke with pt, she is complaining of headaches that started last week. They are in the right side of her head, she reports they are debilitating, throbbing and can be almost blinding to her right eye. Explained to the patient atorvastatin would not cause headaches but she was also started on isosorbide and started that last week. Aware isosorbide can cause a headache but the body is supposed to get used to it the longer it is taken. She will try taking 1/2 tablet or 15 mg, to see if that helps with the headaches. She does take tylenol for the headaches but does not give her any relief. She will call next week if the headaches do not improve on the 15 mg of isosorbide. Pt agreed with this plan.

## 2023-01-23 LAB — ALLERGENS W/TOTAL IGE AREA 2
Aspergillus Fumigatus IgE: <0.1 kU/L
Cat Dander IgE: <0.1 kU/L
Cedar, Mountain IgE: <0.1 kU/L
Cladosporium Herbarum IgE: <0.1 kU/L
Cockroach, German IgE: 0.11 kU/L — AB
Common Silver Birch IgE: <0.1 kU/L
Cottonwood IgE: <0.1 kU/L
D Farinae IgE: <0.1 kU/L
D Pteronyssinus IgE: <0.1 kU/L
Dog Dander IgE: <0.1 kU/L
Elm, American IgE: <0.1 kU/L
Johnson Grass IgE: <0.1 kU/L
Maple/Box Elder IgE: <0.1 kU/L
Mouse Urine IgE: <0.1 kU/L
Pecan, Hickory IgE: <0.1 kU/L
Ragweed, Short IgE: <0.1 kU/L
Sheep Sorrel IgE Qn: <0.1 kU/L
Timothy Grass IgE: <0.1 kU/L

## 2023-01-23 LAB — ALLERGEN HYMENOPTERA PANEL
Bumblebee: <0.1 kU/L
Honeybee IgE: <0.1 kU/L
Hornet, White Face, IgE: <0.1 kU/L
Hornet, Yellow, IgE: <0.1 kU/L
Yellow Jacket, IgE: <0.1 kU/L

## 2023-01-23 LAB — ALLERGEN, STRAWBERRY, F44: Allergen Strawberry IgE: <0.1 kU/L

## 2023-01-24 NOTE — Telephone Encounter (Signed)
error 

## 2023-01-25 ENCOUNTER — Ambulatory Visit (INDEPENDENT_AMBULATORY_CARE_PROVIDER_SITE_OTHER): Payer: 59 | Admitting: Allergy

## 2023-01-25 ENCOUNTER — Encounter: Payer: Self-pay | Admitting: Allergy

## 2023-01-25 VITALS — BP 118/76 | HR 94 | Resp 16

## 2023-01-25 DIAGNOSIS — M069 Rheumatoid arthritis, unspecified: Secondary | ICD-10-CM

## 2023-01-25 DIAGNOSIS — B3781 Candidal esophagitis: Secondary | ICD-10-CM

## 2023-01-25 DIAGNOSIS — T7800XA Anaphylactic reaction due to unspecified food, initial encounter: Secondary | ICD-10-CM

## 2023-01-25 DIAGNOSIS — J302 Other seasonal allergic rhinitis: Secondary | ICD-10-CM | POA: Diagnosis not present

## 2023-01-25 DIAGNOSIS — T7800XD Anaphylactic reaction due to unspecified food, subsequent encounter: Secondary | ICD-10-CM

## 2023-01-25 DIAGNOSIS — H1013 Acute atopic conjunctivitis, bilateral: Secondary | ICD-10-CM | POA: Diagnosis not present

## 2023-01-25 DIAGNOSIS — J3089 Other allergic rhinitis: Secondary | ICD-10-CM | POA: Diagnosis not present

## 2023-01-25 DIAGNOSIS — Z91038 Other insect allergy status: Secondary | ICD-10-CM

## 2023-01-25 NOTE — Patient Instructions (Addendum)
-   labwork only revealed low IgE level for cockroach.   - skin testing today shows grass pollen, weed pollen, tree pollen, outdoor mold, dust mite, cat, dog, mixed feathers, horse, cockroach and tobacco leaf.   Allergen avoidance measures provided.  - Continue with Singulair (montelukast) 10mg  daily - Take Allegra and Xyzal daily one at a time.  If you find either to be effective for allergy symptom control then would use daily to replace Claritin-D use.   For itchy/watery eyes if able you can try Pataday 1 drop each eye daily as needed for itchy/watery eyes. - Consider allergy shots as a means of long-term control.  Allergy shots "re-train" and "reset" the immune system to ignore environmental allergens and decrease the resulting immune response to those allergens (sneezing, itchy watery eyes, runny nose, nasal congestion, etc).   Allergy shots improve symptoms in 75-85% of patients.   - Continue avoidance of strawberry and stinging insects - Strawberry skin testing is positive.   - stinging insect panel was negative thus will add you to our venom testing list and you will be notified when our test venom skin testing day is - Have access to self-injectable epinephrine (Epipen) 0.3mg  at all times - Follow emergency action plan in case of allergic reaction  - Continue as needed use of albuterol 1 vial via nebulizer - Plan to start Tezspire with Dr Judeth Horn after esophageal fungal infection has resolved  - Continue direction and follow-up care with your infectious disease doctor - Continue direction and follow-up care with your rheumatologist for RA management  Follow-up in 3-4 months or sooner if needed

## 2023-01-25 NOTE — Progress Notes (Signed)
Follow-up Note  RE: INFANTOF LLERENA MRN: [id number] DOB: 1971-03-25 Date of Office Visit: 01/25/2023   History of present illness: Casey [name] is a 52 y.o. female presenting today for allergy testing. She was last seen in the office on 01/18/23 by myself for her initial visit.  She states she did not start any of the allergy medications last week in order to skin test today.  Her labwork only revealed low IgE levels for cockroach only.    Medication List: Current Outpatient Medications  Medication Sig Dispense Refill   acetaminophen (TYLENOL) 500 MG tablet Take 1,000 mg by mouth every 12 (twelve) hours as needed for mild pain or moderate pain.     albuterol (PROVENTIL) (2.5 MG/3ML) 0.083% nebulizer solution Take 3 mLs (2.5 mg total) by nebulization every 6 (six) hours as needed for wheezing or shortness of breath. 150 mL 1   atorvastatin (LIPITOR) 20 MG tablet Take 1 tablet (20 mg total) by mouth daily. 90 tablet 3   B Complex-C (B-COMPLEX WITH VITAMIN C) tablet Take 1 tablet by mouth daily.     Cholecalciferol (VITAMIN D3) 50 MCG (2000 UT) TABS Take 1 tablet by mouth daily.     clopidogrel (PLAVIX) 75 MG tablet Take 1 tablet (75 mg total) by mouth daily. 90 tablet 3   EPINEPHrine (EPIPEN 2-PAK) 0.3 mg/0.3 mL IJ SOAJ injection Inject 0.3 mg into the muscle as needed for anaphylaxis. 2 each 2   esomeprazole (NEXIUM) 40 MG capsule Take 1 capsule (40 mg total) by mouth 2 (two) times daily before a meal. 60 capsule 11   famotidine (PEPCID) 40 MG tablet Take 1 tablet (40 mg total) by mouth daily. 30 tablet 11   Golimumab (SIMPONI ARIA IV) Inject 200 mg into the vein every 8 (eight) weeks.     isosorbide mononitrate (IMDUR) 30 MG 24 hr tablet Take 1 tablet (30 mg total) by mouth daily. 90 tablet 3   Loratadine-Pseudoephedrine (CLARITIN-D 24 HOUR PO) Take 1 tablet by mouth daily.     mirabegron ER (MYRBETRIQ) 50 MG TB24 tablet Take 1 tablet (50 mg total) by mouth daily. 30 tablet 11   montelukast  (SINGULAIR) 10 MG tablet Take 1 tablet (10 mg total) by mouth at bedtime. 30 tablet 11   nitroGLYCERIN (NITROSTAT) 0.4 MG SL tablet Place 1 tablet (0.4 mg total) under the tongue every 5 (five) minutes as needed for chest pain. 25 tablet 6   posaconazole (NOXAFIL) 100 MG TBEC delayed-release tablet Take 300 mg by mouth daily.     ZOLMitriptan (ZOMIG) 2.5 MG tablet Take 1 tablet (2.5 mg total) by mouth once for 1 dose. May repeat in 2 hours if headache persists or recurs. 10 tablet 1   No current facility-administered medications for this visit.     Known medication allergies: Allergies  Allergen Reactions   Albuterol Sulfate Shortness Of Breath    Other reaction(s): Other (See Comments)  can't breathe  Other reaction(s): Other (See Comments) can't breathe   Tape     Other reaction(s): Other (See Comments), Other (See Comments), Other (See Comments)  severe skin breakdown  Cast and Bandage Cover  severe skin breakdown  severe skin breakdown   Formoterol Other (See Comments)    Other reaction(s): Headaches  Other reaction(s): Headaches  Other reaction(s): Headaches  Headaches  Other reaction(s): Headaches Other reaction(s): Headaches    Other reaction(s): Headaches    Headaches   Hydrocodone Rash   Hydrocodone-Acetaminophen Rash  Nickel Rash and Other (See Comments)   Other Rash    cast and bandage use  cast and bandage use  cast and bandage use  cast and bandage use    cast and bandage use cast and bandage use   Oxycodone-Acetaminophen Other (See Comments)    headache   Salmeterol Rash   Strawberry Extract Hives    Full body rash from strawberry   Advair Hfa [Fluticasone-Salmeterol]    Asa [Aspirin]    Cleocin [Clindamycin]    Clindamycin/Lincomycin    Fludrocortisone Acetate    Gabapentin    Hydrochlorothiazide    Imipramine    Ipratropium Bromide    Medroxyprogesterone    Meloxicam    Metformin And Related    Nystatin    Penicillins    Pred Forte  [Prednisolone]    Prednisone    Proventil Hfa [Albuterol]    Solu-Medrol [Methylprednisolone]    Sulfa Antibiotics    Tegaderm Ag Mesh [Silver]    Toradol [Ketorolac Tromethamine]    Tramadol      Physical examination (limited): Blood pressure 118/76, pulse 94, resp. rate 16, SpO2 98 %.  General: Alert, interactive, in no acute distress. Lungs: Clear to auscultation without wheezing, rhonchi or rales. {no increased work of breathing. CV: Normal S1, S2 without murmurs. Skin: Warm and dry, without lesions or rashes. Extremities:  No clubbing, cyanosis or edema. Neuro:   Grossly intact.  Diagnositics/Labs: Labs:  Component     Latest Ref Rng 01/18/2023  IgE (Immunoglobulin E), Serum     6 - 495 IU/mL 39   D Pteronyssinus IgE     Class 0 kU/L <0.10   D Farinae IgE     Class 0 kU/L <0.10   Cat Dander IgE     Class 0 kU/L <0.10   Dog Dander IgE     Class 0 kU/L <0.10   [place] Grass IgE     Class 0 kU/L <0.10   Timothy Grass IgE     Class 0 kU/L <0.10   Johnson Grass IgE     Class 0 kU/L <0.10   Cockroach, German IgE     Class 0/I kU/L 0.11 !   Penicillium Chrysogen IgE     Class 0 kU/L <0.10   Cladosporium Herbarum IgE     Class 0 kU/L <0.10   Aspergillus Fumigatus IgE     Class 0 kU/L <0.10   Alternaria Alternata IgE     Class 0 kU/L <0.10   Maple/Box Elder IgE     Class 0 kU/L <0.10   Common Silver Charletta Cousin IgE     Class 0 kU/L <0.10   Cedar, [place] IgE     Class 0 kU/L <0.10   Oak, White IgE     Class 0 kU/L <0.10   Elm, American IgE     Class 0 kU/L <0.10   Cottonwood IgE     Class 0 kU/L <0.10   Pecan, Hickory IgE     Class 0 kU/L <0.10   White Mulberry IgE     Class 0 kU/L <0.10   Ragweed, Short IgE     Class 0 kU/L <0.10   Pigweed, Rough IgE     Class 0 kU/L <0.10   Sheep Sorrel IgE Qn     Class 0 kU/L <0.10   Mouse Urine IgE     Class 0 kU/L <0.10   Honeybee IgE     Class 0 kU/L <0.10   Hornet,  White Face, IgE     Class 0 kU/L <0.10    Yellow Jacket, IgE     Class 0 kU/L <0.10   Paper Wasp IgE     Class 0 kU/L <0.10   Hornet, Yellow, IgE     Class 0 kU/L <0.10   Bumblebee     Class 0 kU/L <0.10   Allergen Strawberry IgE     Class 0 kU/L <0.10     Allergy testing:   Airborne Adult Perc - 01/25/23 1019     Allergen Manufacturer Waynette Buttery    Location Back    Number of Test 56    Panel 1 Select    1. Control-Buffer 50% Glycerol Negative    2. Control-Histamine 2+    3. Bahia Negative    4. [place] 2+    5. Johnson Negative    6. [place] Blue 2+    7. Meadow Fescue 2+    8. Perennial Rye Negative    9. Timothy Negative    10. Ragweed Mix Negative    11. Cocklebur 2+    12. Plantain,  English 2+    13. Baccharis Negative    14. Dog Fennel 2+    15. [place] Thistle 2+    16. Lamb's Quarters Negative    17. Sheep Sorrell 2+    18. Rough Pigweed Negative    19. Marsh Elder, Rough Negative    20. Mugwort, Common Negative    21. Box, Elder 2+    22. Cedar, red Negative    23. Sweet Gum Negative    24. Pecan Pollen Negative    25. Pine Mix Negative    26. Walnut, Black Pollen Negative    27. Red Mulberry 2+    28. Ash Mix 2+    29. Birch Mix 2+    30. Beech American 2+    31. Cottonwood, [place] 2+    32. Hickory, White 2+    33. Maple Mix 2+    34. Oak, [place] Mix Negative    35. Sycamore Eastern Negative    36. Alternaria Alternata 2+    37. Cladosporium Herbarum Negative    38. Aspergillus Mix Negative    39. Penicillium Mix Negative    40. Bipolaris Sorokiniana (Helminthosporium) Negative    41. Drechslera Spicifera (Curvularia) 2+    42. Mucor Plumbeus 2+    43. Fusarium Moniliforme 2+    44. Aureobasidium Pullulans (pullulara) Negative    45. Rhizopus Oryzae Negative    46. Botrytis Cinera Negative    47. Epicoccum Nigrum Negative    48. Phoma Betae Negative    49. Dust Mite Mix 2+    50. Cat Hair 10,000 BAU/ml 2+    51.  Dog Epithelia 2+    52. Mixed Feathers 2+    53. Horse  Epithelia 2+    54. Cockroach, German 2+    55. Tobacco Leaf 2+    2. Other 2+   STRAWBERRY            Allergy testing results were read and interpreted by provider, documented by clinical staff.   Assessment and plan: Allergic Rhinoconjunctivitis   - labwork only revealed low IgE level for cockroach.   - skin testing today shows grass pollen, weed pollen, tree pollen, outdoor mold, dust mite, cat, dog, mixed feathers, horse, cockroach and tobacco leaf.   Allergen avoidance measures provided.  - Continue with Singulair (montelukast) 10mg  daily - Take Allegra and  Xyzal daily one at a time.  If you find either to be effective for allergy symptom control then would use daily to replace Claritin-D use.   For itchy/watery eyes if able you can try Pataday 1 drop each eye daily as needed for itchy/watery eyes. - Consider allergy shots as a means of long-term control.  Allergy shots "re-train" and "reset" the immune system to ignore environmental allergens and decrease the resulting immune response to those allergens (sneezing, itchy watery eyes, runny nose, nasal congestion, etc).   Allergy shots improve symptoms in 75-85% of patients.   Hymenoptera allergy and food allergy  - Continue avoidance of strawberry and stinging insects - Strawberry skin testing is positive.   - stinging insect panel was negative thus will add you to our venom testing list and you will be notified when our test venom skin testing day is - Have access to self-injectable epinephrine (Epipen) 0.3mg  at all times - Follow emergency action plan in case of allergic reaction  Asthma  - Continue as needed use of albuterol 1 vial via nebulizer - Plan to start Te[place]sp[place] with Dr Judeth Horn after esophageal fungal infection has resolved  Recurrent esophageal candidiasis Rheumatoid arthritis - Continue direction and follow-up care with your infectious disease doctor - Continue direction and follow-up care with your  rheumatologist for RA management  Follow-up in 3-4 months or sooner if needed  I appreciate the opportunity to take part in Krisandra's care. Please do not hesitate to contact me with questions.  Sincerely,   Margo Aye, MD Allergy/Immunology [facility]

## 2023-02-01 ENCOUNTER — Encounter: Payer: Self-pay | Admitting: Family Medicine

## 2023-02-01 ENCOUNTER — Ambulatory Visit (INDEPENDENT_AMBULATORY_CARE_PROVIDER_SITE_OTHER): Payer: 59 | Admitting: Family Medicine

## 2023-02-01 VITALS — BP 137/95 | HR 82 | Ht 63.0 in | Wt 225.0 lb

## 2023-02-01 DIAGNOSIS — Z0000 Encounter for general adult medical examination without abnormal findings: Secondary | ICD-10-CM | POA: Diagnosis not present

## 2023-02-01 DIAGNOSIS — Z5320 Procedure and treatment not carried out because of patient's decision for unspecified reasons: Secondary | ICD-10-CM | POA: Diagnosis not present

## 2023-02-01 DIAGNOSIS — Z86010 Personal history of colonic polyps: Secondary | ICD-10-CM | POA: Diagnosis not present

## 2023-02-01 NOTE — Progress Notes (Signed)
Patient presents today for annual exam but does not want pap smear performed. Armandina Stammer RN

## 2023-02-01 NOTE — Patient Instructions (Signed)
Schedule your pap smear

## 2023-02-01 NOTE — Progress Notes (Incomplete)
GYNECOLOGY ANNUAL PREVENTATIVE CARE ENCOUNTER NOTE  History:     Casey [name] is a 52 y.o. G0P0000 female here for a routine annual gynecologic exam.  Current complaints: no gynecological complaints and declines pap.   Denies abnormal vaginal bleeding, discharge, pelvic pain, problems with intercourse or other gynecologic concerns.    Gynecologic History No LMP recorded. Patient is postmenopausal. Contraception: none Last Pap: 2016?[name] Result was reported to be abnormal and the patient states she had a leep/cone biopsy in 2001 Last Mammogram: 12/20/2022.  Result was normal Last Colonoscopy: 07/2019: 8 mm colonic polyp removed during colonoscopy, biopsy showed serrated polyp. Repeat colonoscopy recommended in one year.   Obstetric History OB History  Gravida Para Term Preterm AB Living  0 0 0 0 0 0  SAB IAB Ectopic Multiple Live Births  0 0 0 0 0    Past Medical History:  Diagnosis Date   Albuminuria 06/27/2015   Anxiety 11/04/2014   Arm DVT (deep venous thromboembolism), acute, left (HCC) 08/17/2022   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/30/2014   Asthma    Atopic dermatitis 10/30/2008   Formatting of this note might be different from the original. Atopic dermatitis Formatting of this note might be different from the original. Formatting of this note might be different from the original. Atopic dermatitis Formatting of this note might be different from the original. Formatting of this note might be different from the original. Formatting of this note might be different from the or   Cataract 05/06/2015   Contusion of left wrist 07/08/2021   Crohn's disease (HCC) 04/21/2015   De Quervain's tenosynovitis, left 07/31/2021   Diabetes mellitus without complication (HCC)    Diabetes type 2, controlled (HCC) 04/21/2015   Dry eyes 05/06/2015   DVT (deep venous thrombosis) (HCC)    Dysfunction of both eustachian tubes 08/31/2012   Encounter for monitoring  immunomodulating therapy 11/20/2021   Environmental allergies 08/12/2020   Esophageal candidiasis (HCC) 06/26/2022   Fibrositis 08/28/2014   Formatting of this note might be different from the original. Fibromyalgia Formatting of this note might be different from the original. Formatting of this note might be different from the original. Fibromyalgia   Flank pain 11/24/2020   Gastroesophageal reflux disease with esophagitis without hemorrhage 08/01/2019   Formatting of this note might be different from the original. Formatting of this note might be different from the original. 07/2019: chronic symptoms of esophageal reflux since prior to sleeve gastrectomy surgery. Symptoms have worsened over the last 6 months with recent EGD findings of grade B esophagitis, irregular z line, erythematous mucosa, and evidence of sleeve gastrectomy. 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 Formatting of this note might be different from the original. 07/2019: 8 mm colonic polyp removed during colonoscopy, biopsy showed serrated polyp. Repeat colonoscopy recommended in one year. Formatting of this note might be different from the original. 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 Hypertension Formatting  of this note might be different from the original. Formatting of this note might be different from the original. 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 Keratoconus Formatting of this note might be different from the original. Formatting of this note might be different from the original. Keratoconus Formatting of this note might be different from the original. Formatting of this note might be different from the original. Keratoconus Formatting of this note might be different from the or   Microhematuria 06/27/2015   Migraine 08/28/2014   Formatting of this note might be different from the original. Migraine Formatting of this note might be different from the original. Formatting of this note might be different from the original. Migraine Formatting of this note might be different from the original. Migraine Formatting of this note might be different from the original. Migraine Formatting of this note might be different from the or   Migraine without aura and without status migrainosus, not intractable 08/28/2014   Formatting of this note might be different from the original. Formatting of this note might be different from the original. Formatting of this note might be different from the original. Migraine Formatting of this note might be different from the original. Migraine Formatting of this note might be different from the original. Formatting of this note might be different from the original. Migraine F   Mild intermittent asthma without complication 10/30/2008   Formatting of this note might be different from the original. Formatting of this note might be different from the original. Formatting of this note might be different from the original. Asthma Formatting of this note might be different from the original. Asthma Formatting of this note might be different from the original. Formatting of this note might be different from the original. 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 Formatting of this note might be different from the original. 01/25/2016 with Dr. Dionisio David at [facility]. [place] of this note might be different from the original. 01/25/2016 with Dr. Dionisio David at [facility] [place] of this note might be different from the original. Formatting of this note might be different from    Seronegative inflammatory arthritis 08/28/2014   Formatting of this note might be different from the original. Formatting of this note might be different from the original. Arthritis; diagnosed as IBD related - 2012 Formatting of this note might be different from the original. Followed by rheumatologist. Had to change providers due to an insurance change earlier this year resulting in patient not being able to have Remicade for ~6 months. 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 Formatting  of this note might be different from the original. Treated with PRN miralax. Recent colonoscopy 07/2019. Formatting of this note might be different from the original. Treated with PRN miralax. Recent colonoscopy 07/2019. Formatting of this note might be different from the original. Formatting of this note might be different f    Status post placement of ureteral stent 02/06/2021   Tooth infection 08/12/2020   Ureteral stone 01/20/2022   Urinary tract infection, site not specified 06/26/2022    Past Surgical History:  Procedure Laterality Date   BACK SURGERY     L5-S1   CERVICAL CONE BIOPSY  04/2000   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   EYE SURGERY     GASTRIC BYPASS  2017   Sleve   LAPAROSCOPIC GASTRIC SLEEVE RESECTION     Low back disc surgery     06/2000   TOTAL KNEE ARTHROPLASTY Right 11/2016    Current Outpatient Medications on File Prior to Visit  Medication Sig Dispense Refill   acetaminophen (TYLENOL) 500 MG tablet Take 1,000 mg by mouth every 12 (twelve) hours as needed for mild pain or moderate pain.     albuterol (PROVENTIL) (2.5 MG/3ML) 0.083% nebulizer solution Take 3 mLs (2.5 mg total) by nebulization every 6 (six) hours as needed for wheezing or shortness of breath. 150 mL 1   atorvastatin (LIPITOR) 20 MG tablet Take 1 tablet (20 mg total) by mouth daily. 90 tablet 3   B Complex-C (B-COMPLEX WITH VITAMIN C) tablet Take 1 tablet by mouth daily.     Cholecalciferol (VITAMIN D3) 50 MCG (2000 UT) TABS Take 1 tablet by mouth daily.     clopidogrel (PLAVIX) 75 MG tablet Take 1 tablet (75 mg total) by mouth daily. 90 tablet 3   EPINEPHrine (EPIPEN 2-PAK) 0.3 mg/0.3 mL IJ SOAJ injection Inject 0.3 mg into the muscle as needed for anaphylaxis. 2 each 2   esomeprazole (NEXIUM) 40 MG capsule Take 1 capsule (40 mg total) by mouth 2 (two) times daily before a meal. 60 capsule 11   famotidine (PEPCID) 40 MG tablet Take 1 tablet (40 mg total) by mouth daily. 30 tablet 11   Golimumab (SIMPONI ARIA IV) Inject 200 mg into the vein every 8 (eight) weeks.     isosorbide mononitrate (IMDUR) 30 MG 24 hr tablet Take 1 tablet (30 mg total) by mouth daily. 90 tablet 3   Loratadine-Pseudoephedrine (CLARITIN-D 24 HOUR PO) Take 1 tablet by mouth daily.     mirabegron ER (MYRBETRIQ) 50 MG TB24 tablet Take 1 tablet (50  mg total) by mouth daily. 30 tablet 11   montelukast (SINGULAIR) 10 MG tablet Take 1 tablet (10 mg total) by mouth at bedtime. 30 tablet 11   nitroGLYCERIN (NITROSTAT) 0.4 MG SL tablet Place 1 tablet (0.4 mg total) under the tongue every 5 (five) minutes as needed for chest pain. 25 tablet 6   posaconazole (NOXAFIL) 100 MG TBEC delayed-release tablet Take 300 mg by mouth daily.     ZOLMitriptan (ZOMIG) 2.5 MG tablet Take 1 tablet (2.5 mg total) by mouth once for 1 dose. May repeat in 2 hours if headache persists or recurs. 10 tablet 1   No current facility-administered medications on file prior to visit.    Allergies  Allergen Reactions   Albuterol Sulfate Shortness Of Breath    Other reaction(s): Other (See Comments)  can't breathe  Other reaction(s): Other (See Comments) can't breathe   Tape     Other reaction(s): Other (See Comments), Other (  See Comments), Other (See Comments)  severe skin breakdown  Cast and Bandage Cover  severe skin breakdown  severe skin breakdown   Formoterol Other (See Comments)    Other reaction(s): Headaches  Other reaction(s): Headaches  Other reaction(s): Headaches  Headaches  Other reaction(s): Headaches Other reaction(s): Headaches    Other reaction(s): Headaches    Headaches   Hydrocodone Rash   Hydrocodone-Acetaminophen Rash   Nickel Rash and Other (See Comments)   Other Rash    cast and bandage use  cast and bandage use  cast and bandage use  cast and bandage use    cast and bandage use cast and bandage use   Oxycodone-Acetaminophen Other (See Comments)    headache   Salmeterol Rash   Strawberry Extract Hives    Full body rash from strawberry   Advair Hfa [Fluticasone-Salmeterol]    Asa [Aspirin]    Cleocin [Clindamycin]    Clindamycin/Lincomycin    Fludrocortisone Acetate    Gabapentin    Hydrochlorothiazide    Imipramine    Ipratropium Bromide    Medroxyprogesterone    Meloxicam    Metformin And Related    Nystatin     Penicillins    Pred Forte [Prednisolone]    Prednisone    Proventil Hfa [Albuterol]    Solu-Medrol [Methylprednisolone]    Sulfa Antibiotics    Tegaderm Ag Mesh [Silver]    Toradol [Ketorolac Tromethamine]    Tramadol     Social History:  reports that she has never smoked. She has never been exposed to tobacco smoke. She has never used smokeless tobacco. She reports current alcohol use. She reports that she does not use drugs.  Family History  Adopted: Yes  Problem Relation Age of Onset   Diabetes Paternal Grandmother    Diabetes Father    Hypertension Father    Diabetes Mother    Hypertension Mother    Hypertension Brother    Diabetes Brother    Hypertension Sister    Diabetes Sister    Cancer Other    Asthma Neg Hx    Allergic rhinitis Neg Hx    Atopy Neg Hx    Eczema Neg Hx     The following portions of the patient's history were reviewed and updated as appropriate: allergies, current medications, past family history, past medical history, past social history, past surgical history and problem list.  Review of Systems Pertinent items noted in HPI and remainder of comprehensive ROS otherwise negative.  Physical Exam:  BP (!) 137/95   Pulse 82   Ht 5\' 3"  (1.6 m)   Wt 225 lb (102.1 kg)   BMI 39.86 kg/m  CONSTITUTIONAL: Well-developed, well-nourished female in no acute distress.  HENT:  Normocephalic, atraumatic, External right and left ear normal.  EYES: Conjunctivae and EOM are normal. NECK: Normal range of motion, supple, no masses.  Normal thyroid.  SKIN: Skin is warm and dry. No rash noted. Not diaphoretic. No erythema. No pallor. MUSCULOSKELETAL: Normal range of motion. No tenderness.  No cyanosis, clubbing, or edema. NEUROLOGIC: Alert and oriented to person, place, and time. PSYCHIATRIC: Normal mood and affect. Elevated and fast speech CARDIOVASCULAR: Normal heart rate noted, regular rhythm RESPIRATORY: Clear to auscultation bilaterally. Effort and breath  sounds normal, no problems with respiration noted. ABDOMEN: Soft, no distention noted.  No tenderness, rebound or guarding.  PELVIC: Deferred   Assessment and Plan:     1. Encounter for annual physical exam No acute concerns. Complex care patient who  is managed by PCP and several specialist. Here today to establish care.   2. Pap smear of cervix declined Declined due to pain. Last pap documented to be abnormal in 2016. Repeat recommended. The patient plans to schedule when pain management optimized.   3. History of colon polyps - Ambulatory referral to Gastroenterology      Lavonda Jumbo, DO OB Fellow, Faculty Practice [facility], [facility] 02/01/2023, 11:01 AM

## 2023-02-23 ENCOUNTER — Telehealth: Payer: Self-pay | Admitting: Cardiology

## 2023-02-23 ENCOUNTER — Telehealth: Payer: Self-pay

## 2023-02-23 NOTE — Transitions of Care (Post Inpatient/ED Visit) (Signed)
02/23/2023  Name: Casey [name] MRN: [id number] DOB: Oct 30, 1970  Today's TOC FU Call Status: Today's TOC FU Call Status:: Successful TOC FU Call Competed TOC FU Call Complete Date: 02/23/23  Transition Care Management Follow-up Telephone Call Date of Discharge: 02/22/23 Discharge Facility: Other ([facility]) Name of Other ([facility]) Discharge Facility: [facility] Type of Discharge: Inpatient Admission Primary Inpatient Discharge Diagnosis:: esophagitis How have you been since you were released from the hospital?: Better Any questions or concerns?: No  Items Reviewed: Did you receive and understand the discharge instructions provided?: Yes Medications obtained,verified, and reconciled?: Yes (Medications Reviewed) Any new allergies since your discharge?: No Dietary orders reviewed?: Yes Do you have support at home?: No  Medications Reviewed Today: Medications Reviewed Today     Reviewed by Karena Addison, LPN (Licensed Practical Nurse) on 02/23/23 at 1101  Med List Status: <None>   Medication Order Taking? Sig Documenting Provider Last Dose Status Informant  acetaminophen (TYLENOL) 500 MG tablet [id number] No Take 1,000 mg by mouth every 12 (twelve) hours as needed for mild pain or moderate pain. [provider] Taking Active   albuterol (PROVENTIL) (2.5 MG/3ML) 0.083% nebulizer solution [id number] No Take 3 mLs (2.5 mg total) by nebulization every 6 (six) hours as needed for wheezing or shortness of breath. Olive Bass, FNP Taking Active   atorvastatin (LIPITOR) 20 MG tablet [id number] No Take 1 tablet (20 mg total) by mouth daily. Georgeanna Lea, MD Taking Active   B Complex-C (B-COMPLEX WITH VITAMIN C) tablet [id number] No Take 1 tablet by mouth daily. [provider] Taking Active   Cholecalciferol (VITAMIN D3) 50 MCG (2000 UT) TABS [id number] No Take 1 tablet by mouth daily. [provider] Taking Active   clopidogrel (PLAVIX)  75 MG tablet [id number] No Take 1 tablet (75 mg total) by mouth daily. Georgeanna Lea, MD Taking Active   EPINEPHrine (EPIPEN 2-PAK) 0.3 mg/0.3 mL IJ SOAJ injection [id number] No Inject 0.3 mg into the muscle as needed for anaphylaxis. Marcelyn Bruins, MD Taking Active   esomeprazole (NEXIUM) 40 MG capsule [id number] No Take 1 capsule (40 mg total) by mouth 2 (two) times daily before a meal. Olive Bass, FNP Taking Active   famotidine (PEPCID) 40 MG tablet [id number] No Take 1 tablet (40 mg total) by mouth daily. Olive Bass, FNP Taking Active   Golimumab [employer] ARIA IV) [id number] No Inject 200 mg into the vein every 8 (eight) weeks. [provider] Taking Active   isosorbide mononitrate (IMDUR) 30 MG 24 hr tablet [id number] No Take 1 tablet (30 mg total) by mouth daily. Georgeanna Lea, MD Taking Active   Loratadine-Pseudoephedrine (CLARITIN-D 24 HOUR PO) [id number] No Take 1 tablet by mouth daily. [provider] Taking Active   mirabegron ER (MYRBETRIQ) 50 MG TB24 tablet [id number] No Take 1 tablet (50 mg total) by mouth daily. Olive Bass, FNP Taking Active   montelukast (SINGULAIR) 10 MG tablet [id number] No Take 1 tablet (10 mg total) by mouth at bedtime. Olive Bass, FNP Taking Active   nitroGLYCERIN (NITROSTAT) 0.4 MG SL tablet [id number] No Place 1 tablet (0.4 mg total) under the tongue every 5 (five) minutes as needed for chest pain. Georgeanna Lea, MD Taking Active   posaconazole (NOXAFIL) 100 MG TBEC delayed-release tablet [id number] No Take 300 mg by mouth daily. [provider] Taking Active   ZOLMitriptan (ZOMIG) 2.5 MG tablet [id number] No Take 1 tablet (2.5 mg total) by mouth  once for 1 dose. May repeat in 2 hours if headache persists or recurs. Olive Bass, FNP Taking Expired 12/28/22 2359             Home Care and Equipment/Supplies: Were Home Health Services Ordered?: NA Any new  equipment or medical supplies ordered?: NA  Functional Questionnaire: Do you need assistance with bathing/showering or dressing?: No Do you need assistance with meal preparation?: No Do you need assistance with eating?: No Do you have difficulty maintaining continence: No Do you need assistance with getting out of bed/getting out of a chair/moving?: No Do you have difficulty managing or taking your medications?: No  Follow up appointments reviewed: PCP Follow-up appointment confirmed?: NA Specialist Hospital Follow-up appointment confirmed?: No Reason Specialist Follow-Up Not Confirmed: Patient has Specialist Provider Number and will Call for Appointment Do you need transportation to your follow-up appointment?: No Do you understand care options if your condition(s) worsen?: Yes-patient verbalized understanding    SIGNATURE Karena Addison, LPN [facility] Nurse Health Advisor Direct Dial [phone]

## 2023-02-23 NOTE — Telephone Encounter (Signed)
Pt is requesting a callback regarding her most recent hospital stay at [facility]. Please advise

## 2023-02-23 NOTE — Telephone Encounter (Signed)
Spoke with pt who states that she was dc'd from [place] and has been taken off Atorvastatin and Plavix. Pt states she stopped the Imdur and Plaquenil due to headaches. Pt also reports that her BP has been elevated for 2 weeks 145-170's/80-110's. Please advise.    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  Current Discharge Medication List   DISCONTINUED medications  atorvastatin (LIPITOR) 20 mg tablet  golimumab (SIMPONI ARIA) 50 mg/4 mL injection    NEW medications Details diclofenac sodium (VOLTAREN) 1 % gel Apply two g topically 4 (four) times a day as needed. Start date: 02/22/2023  hydroxychloroquine (PLAQUENIL) 200 MG tablet Take one tablet (200 mg dose) by mouth daily. Start date: 02/23/2023, End date: 02/23/2024  linaclotide (LINZESS) 72 mcg capsule Take one capsule (72 mcg dose) by mouth 30 (thirty) minutes before breakfast. Start date: 02/23/2023    CONTINUED medications Details acetaminophen (TYLENOL) 500 mg tablet Take one tablet (500 mg dose) by mouth every 6 (six) hours as needed for Pain.  albuterol sulfate (PROVENTIL) 2.5 mg/3 mL nebulizer solution Take 3 mLs (2.5 mg dose) by nebulization every 6 (six) hours as needed for Wheezing.  b complex vitamins capsule Take one capsule by mouth every morning.  Cholecalciferol (D3 HIGH POTENCY) 50 MCG (2000  UT) CAPS Take one capsule (2,000 Units dose) by mouth every morning.  clopidogrel bisulfate (PLAVIX) 75 mg tablet Take one tablet (75 mg dose) by mouth every morning.  esomeprazole magnesium (NEXIUM) 40 mg capsule Take two capsules (80 mg dose) by mouth 30 (thirty) minutes before breakfast.  famotidine (PEPCID) 40 MG tablet Take one tablet (40 mg dose) by mouth every morning.  isosorbide mononitrate (IMDUR) 30 mg 24 hr tablet Take one half tablet (15 mg dose) by mouth every morning.  mirabegron (MYRBETRIQ) 50 mg 24 hr tablet Take one tablet (50 mg dose) by mouth every morning.  montelukast (SINGULAIR) 10 MG tablet Take one tablet (10 mg dose) by mouth every morning.  Multiple Vitamins-Minerals (WOMENS MULTI) CAPS Take 1 capsule by mouth every morning.  nitroGLYCERIN (NITROSTAT) 0.4 mg SL tablet Place one tablet (0.4 mg dose) under the tongue every 5 (five) minutes as needed for Chest pain.  urea (CARMOL) 10% cream Apply one application. topically as needed (apply to feet).  ZOLMitriptan (ZOMIG) 2.5 MG tablet Take one tablet (2.5 mg dose) by mouth as needed for Migraine.    Reason for medication changes: Nausea and vomiting multifactorial  Hospital Course  Indication for Admission/chief complaint: Makeila Priyana Mccarey is a 52 y.o. female referred for direct admission by Dr. 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 She has got right elbow pain with exam consistent with tennis elbow  She had a previous upper extremity VTE On review of her MAR she is listed as taking Plavix unclear indication I have put this on hold in the event she is actually taking it  Hospital Course: The patient was seen in consultation by gastroenterology and infectious disease  She underwent an endoscopy which was pretty much unrevealing see below with nonspecific pathology see below There was no Candida and ID has suspended antifungal treatment  She reached out a couple times to her bariatric surgeon and never got a call back she will plan to see him on an outpatient basis  GI added Linzess with good results she appears to have worsening nausea and vomiting when she is constipated  She had a nosebleed during this hospitalization she was taking Plavix As best I can tell this was prescribed physician in [place] for an abnormal coronary calcium score she has not had any follow-up ischemia evaluation with stress testing and use of Plavix in this context may be aggressive and I encouraged her to have a follow-up conversation about it  She is suspended her rheumatoid arthritis biologic due to the recurrent fungal infections trialed topical diclofenac and Plaquenil will send her out with this but with no refills she should follow-up with her  rheumatologist as she is getting she is having a terrible burden of joint pain No notes on file  Recommendations to physicians/followup needed: none  Physical Exam: Vitals: 02/22/23 0816 BP: (!) 151/96 Pulse: 76 Resp: Temp: 97.1 F (36.2 C) SpO2: 96%  Labs on Discharge: Recent Labs Units 02/17/23 1511 02/17/23 0825 02/15/23 2055 WBC thou/mcL 8.6 7.9 10.2 HGB gm/dL 16.1 09.6 04.5 HCT % 40.9 38.6 44.9 PLT thou/mcL 234 224 256  Recent Labs Units 02/19/23 1132 02/16/23 1654 02/15/23 2055 NA mmol/L 136 140 139 K mmol/L 3.3* 3.3* 3.5* CL mmol/L 102 102 105 CO2 mmol/L 23 26 20  BUN mg/dL 6 13 11  CREATININE mg/dL 8.11* 9.14 7.82 CALCIUM mg/dL 8.8 9.9 9.5  Recent Labs Units 02/19/23 1132 02/16/23 1654 02/15/23 2055 BILITOT mg/dL 9.56 2.13 0.86 AST U/L 24 30 40 ALT U/L 29 65* 75* ALKPHOS U/L 66 91 90 ALBUMIN gm/dL 3.1* 4.0 3.9  No results for input(s): "TSH", "HGBA1C" in the last 168 hours. Recent Labs Units 02/15/23 2055 INR 1.0 PTT second(s) 27  No results for input(s): "CHOL", "LDL", "HDL", "TRIG" in the last 168 hours. No results for input(s): "TROPONIN", "CK" in the last 168 hours.  Invalid input(s): "CK-MB"  Diagnostics: Findings: The esophageal mucosa was relatively normal although a mild amount of erythema was noted in the distal esophagus. There is no evidence of white plaques or candidal infection. Brushings were obtained to  evaluate for active infection as well as cultures and sensitivity. Postsurgical changes were noted in the stomach. There is an area of polypoid mucosa in the body of the stomach that was likely secondary to granulation and postsurgical change but biopsies were obtained with cold forceps to rule out an adenoma. The duodenal bulb and second portion of the duodenum were normal.  -- Stomach, polyp, biopsy:  -Scant superficial fragments of antral type gastric mucosa with no significant histopathologic findings; multiple levels  examined.  Post Hospital Care No discharge procedures on file.  Diet: Regular Diet No Red Liquids  Potential for Rehab: Good Code Status: Full Code Disposition: Home Consults: id gi  Followup appointments: Future Appointments Date Time Provider Department Center 03/09/2023 10:20 AM Adwait Polly Cobia, MD [facility] None  Time spent in discharge process: total time spent 45 minutes  Electronically signed: Kayleen Memos, MD 02/22/2023 / 9:47 AM  Electronically signed by Kayleen Memos, MD at 02/22/2023 9:51 AM EDT  Medications at Time of Discharge - documented as of this encounter  Medications at Time of Discharge Medication Sig Dispensed Refills Start Date End Date  acetaminophen (TYLENOL) 500 mg tablet  Take one tablet (500 mg dose) by mouth every 6 (six) hours as needed for Pain.   0      albuterol sulfate (PROVENTIL) 2.5 mg/3 mL nebulizer solution  Take 3 mLs (2.5 mg dose) by nebulization every 6 (six) hours as needed for Wheezing.   0      b complex vitamins capsule  Take one capsule by mouth every morning.   0      Cholecalciferol (D3 HIGH POTENCY) 50 MCG (2000 UT) CAPS  Take one capsule (2,000 Units dose) by mouth every morning.   0      clopidogrel bisulfate (PLAVIX) 75 mg tablet  Take one tablet (75 mg dose) by mouth every morning.   0 12/28/2022    diclofenac sodium (VOLTAREN) 1 % gel  Apply two g topically 4 (four) times a day as needed. 60 g  0 02/22/2023    esomeprazole magnesium (NEXIUM) 40 mg capsule  Take two capsules (80 mg dose) by mouth 30 (thirty) minutes before breakfast.   0      famotidine (PEPCID) 40 MG tablet  Take one tablet (40 mg dose) by mouth every morning.   0      hydroxychloroquine (PLAQUENIL) 200 MG tablet  Take one tablet (200 mg dose) by mouth daily. 30 tablet  0 02/23/2023 02/23/2024  isosorbide mononitrate (IMDUR) 30 mg 24 hr tablet  Take one half tablet (15 mg dose) by mouth every morning.   0 12/28/2022 03/28/2023  linaclotide (LINZESS) 72 mcg  capsule  Take one capsule (72 mcg dose) by mouth 30 (thirty) minutes before breakfast. 30 capsule  0 02/23/2023    mirabegron (MYRBETRIQ) 50 mg 24 hr tablet  Take one tablet (50 mg dose) by mouth every morning.   0 06/10/2022    montelukast (SINGULAIR) 10 MG tablet  Take one tablet (10 mg dose) by mouth every morning.   0      Multiple Vitamins-Minerals (WOMENS MULTI) CAPS  Take 1 capsule by mouth every morning.   0      nitroGLYCERIN (NITROSTAT) 0.4 mg SL tablet  Place one tablet (0.4 mg dose) under the tongue every 5 (five) minutes as needed for Chest pain.   0      urea (CARMOL) 10% cream  Apply one application. topically as needed (apply  to feet).   0 11/16/2022    ZOLMitriptan (ZOMIG) 2.5 MG tablet  Take one tablet (2.5 mg dose) by mouth as needed for Migraine.   0       Ordered Prescriptions - documented in this encounter Reconcile with Patient's Chart Ordered Prescriptions Prescription Sig Dispensed Refills Start Date End Date  linaclotide (LINZESS) 72 mcg capsule  Take one capsule (72 mcg dose) by mouth 30 (thirty) minutes before breakfast. 30 capsule  0 02/23/2023    hydroxychloroquine (PLAQUENIL) 200 MG tablet  Take one tablet (200 mg dose) by mouth daily. 30 tablet  0 02/23/2023 02/23/2024  diclofenac sodium (VOLTAREN) 1 % gel  Apply two g topically 4 (four) times a day as needed. 60 g  0 02/22/2023     Discharge Disposition - documented in this encounter  Discharge Disposition Disposition Code Departure Means Destination  Home or Self Care   Car     Progress Notes - documented in this encounter  Table of Contents for Progress Notes  Barkley Boards, MD - 02/22/2023 11:01 AM EDT  Norva Riffle, RN, BSN - 02/22/2023 9:57 AM EDT  Flossie Buffy, BSW - 02/22/2023 9:55 AM EDT  Katherene Ponto, LPN - 16/05/9603 12:58 PM EDT  Katherene Ponto, LPN - 54/04/8118 12:29 PM EDT  Jones Skene Pleasant, PA - 02/21/2023 11:21 AM EDT  Kayleen Memos, MD - 02/21/2023 11:19 AM EDT  Kayleen Memos, MD - 02/20/2023 7:41 AM EDT  Kayleen Memos, MD - 02/19/2023 10:20 AM EDT  Kayleen Memos, MD - 02/18/2023 7:45 AM EDT  Sunday Shams, RN - 02/17/2023 1:45 PM EDT  Sunday Shams, RN - 02/17/2023 1:45 PM EDT  Ludwig Clarks, OTR/L - 02/17/2023 9:59 AM EDT  Kayleen Memos, MD - 02/17/2023 7:48 AM EDT  Emmaline Life, RN - 02/17/2023 3:10 AM EDT    Barkley Boards, MD - 02/22/2023 11:01 AM EDT Formatting of this note is different from the original. [facility] Daily Rounding Note Admission date: 02/16/2023 Length of stay: 6 days  Antibiotics: none Subjective Patient ID: Manali Mcelmurry is a 52 y.o. (01-29-1971) female. She suffers from sero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 arthritis on Remicade, plaquenil. She has had prior sleeve gastrectomy with ongoing chronic nausea and vomiting. She has undergone balloon dilatation procedure in the past. EGD was done in April 2024 which was notable for mild esophageal dilation, esophageal plaque were concerning for candidiasis. She was given fluconazole and has been on multiple rounds of treatment so far. She was seen in our outpatient office and initially we gave her higher dose of oral fluconazole, she did not tolerate clotrimazole troche. Despite fluconazole high dose. Throat swab on 12/28/2022 grew Candida albicans, sensitivity studies showed sensitive to azoles. She had to be admitted to the hospital due to odynophagia from 5/26 - 01/08/2023. She was given IV fluconazole which gave her partial relief. At a visit on 6/4 she was switched to oral posaconazole. Around 6/26 micafungin was initiated but she could not tolerate (abdominal pain, dizziness, elevated LFTs).  She returned to the outpatient office on 7/10 complaining of severe pain in her mouth and unable to keep any food down's. She was referred to the hospital for admission. On 7/12 she underwent an EGD. Findings were: Postsurgical changes but no evidence of active infection status post esophageal brushings. No  evidence of white plaques or candidal infection. Path of stomach polyp showed scant superficial fragments of antral type gastric mucosa with  no significant histopathologic findings.  At the time of consult on 7/12 she says she still having difficulty tolerating food and liquids. She however is a little bit improved as it does not feel like she is swallowing shards of glass anymore.  Subjective: Doing well, no complaints today  Review of Systems No fever No vomiting/diarrhea No skin rash  Objective Temp: [97.1 F (36.2 C)-99 F (37.2 C)] 97.1 F (36.2 C) Heart Rate: [70-92] 76 Resp: [16-18] 16 BP: (131-159)/(82-97) 151/96 SpO2: [96 %-98 %] 96 % General: Well developed, well nourished, no distress, sitting in bedside chair Psychiatric: Normal affect Oropharynx: moist and clear Eyes: Sclera anicteric CV: Lungs: normal effort Abdomen: Skin: No Focal Rashes Extremities: No C/C/E. Neuro: Alert and oriented Vascular: vascular access site without redness or tenderness.  Recent Labs Units 02/17/23 1511 02/17/23 0825 02/15/23 2055 WBC thou/mcL 8.6 7.9 10.2 HGB gm/dL 16.1 09.6 04.5 HCT % 40.9 38.6 44.9 PLT thou/mcL 234 224 256  Recent Labs Units 02/19/23 1132 02/16/23 1654 02/15/23 2055 NA mmol/L 136 140 139 K mmol/L 3.3* 3.3* 3.5* CL mmol/L 102 102 105 CO2 mmol/L 23 26 20  BUN mg/dL 6 13 11  CREATININE mg/dL 8.11* 9.14 7.82 CALCIUM mg/dL 8.8 9.9 9.5  Recent Labs Units 02/19/23 1132 02/16/23 1654 02/15/23 2055 PROTEIN gm/dL 6.6 8.2 8.1 ALBUMIN gm/dL 3.1* 4.0 3.9 ALKPHOS U/L 66 91 90 AST U/L 24 30 40 ALT U/L 29 65* 75*  Lab Results Component Value Date/Time Procalcitonin <0.02 01/10/2023 01:01 PM  No results found for: "SEDRATE" No results found for: "CRP" Lab Results Component Value Date/Time CK 85 01/10/2023 01:01 PM  Impression Esophagitis/dysphagia Multiple drug allergies/intolerances RA on immunosuppression S/p lap sleeve  gastrectomy  Discussion and Plan: This is my first time seeing the patient.  Discussed EGD findings. There is no indication for antifungal treatment. She does not need to follow-up with [place] and I will have our office cancel her appointment. She is in agreement with this plan.  Barkley Boards, MD  This note was dictated with voice recognition software. Similar sounding words can inadvertently be transcribed and may not be corrected upon review. Electronically signed by Barkley Boards, MD at 02/22/2023 11:05 AM EDT  Back to top of Progress Notes Norva Riffle, RN, BSN - 02/22/2023 9:57 AM EDT Formatting of this note might be different from the original.  Problem: Fall Prevention Goal: Absence of falls Outcome: Adequate for Discharge  Problem: Nausea/Vomiting Goal: Absence of nausea Outcome: Adequate for Discharge Goal: Absence of vomiting Outcome: Adequate for Discharge Goal: Adequate hydration Outcome: Adequate for Discharge Goal: Adequate nutritional intake Outcome: Adequate for Discharge Goal: Decrease number of vomiting episodes Outcome: Adequate for Discharge  Problem: Discharge Planning Goal: Knowledge of medical problems (What is my main problem?) Outcome: Adequate for Discharge Goal: Knowledge of self care (What do I need to do when I go home?) Outcome: Adequate for Discharge Goal: Knowledge of treatment plan (Why is it important for me to do this?) Outcome: Adequate for Discharge Goal: Knowledge of medication management Outcome: Adequate for Discharge  Problem: Injury Risk, Abnormal Glucose Level Goal: Glucose level within specified parameters Outcome: Adequate for Discharge  Problem: Sensory Perception - Impaired Goal: Absence of physical injury Outcome: Adequate for Discharge  Electronically signed by Norva Riffle, RN, BSN at 02/22/2023 9:57 AM EDT  Back to top of Progress Notes Flossie Buffy, BSW - 02/22/2023 9:55 AM EDT Formatting of this note  is different from the original. [facility]  MEDICAL CENTER Case Management Interim Note  Patient: Birgitta Uhlir MR Number: [id number] Patient Date of Birth: 04-29-1971 Age/Sex: 52 y.o./female  TREATMENT PLAN  Active Orders Diet Regular Diet No Red Liquids Nursing Initiate bathing treatment with 2% Chlorhexidine Gluconate Notify physician for any abnormal lab values Nursing communication Nursing communication Nursing communication Code Status Full Code Blue: Attempt Resuscitation (CPR) if pulseless and not breathing Consult Inpatient consult to gastroenterology Discharge Discharge patient Pathology and Cytology Non Gyn Cytology Request Point of Care Testing-Docked Device Fingerstick blood glucose (FSBG) Medications acetaminophen (TYLENOL) tablet 650 mg acetaminophen (TYLENOL) tablet 650 mg albuterol sulfate (PROVENTIL) 2.5 mg/3 mL nebulizer solution 2.5 mg clopidogrel bisulfate (PLAVIX) tablet 75 mg diclofenac sodium (VOLTAREN) 1 % gel 2 g dicyclomine (BENTYL) capsule 10 mg docusate sodium (COLACE) 50 mg/5 mL liquid 100 mg docusate sodium (COLACE,DOK,DOCQLACE) capsule 100 mg famotidine (PEPCID) tablet 40 mg hydroxychloroquine (PLAQUENIL) tablet 200 mg isosorbide mononitrate (IMDUR) 24 hr tablet 15 mg linaclotide (LINZESS) capsule 72 mcg mirabegron (MYRBETRIQ) ER 24hr tablet 50 mg montelukast (SINGULAIR) tablet 10 mg nitroGLYCERIN (NITROSTAT) SL tablet 0.4 mg ondansetron (ZOFRAN) injection 4 mg potassium chloride (K-DUR,KLOR-CON) CR tablet 20 mEq SUMAtriptan succinate (IMITREX) tablet 100 mg  DISCHARGE PLAN  Facility/Agency Type: Home, no needs Case Manager Name and Phone Number: Meriam Sprague, [place] [phone] Nursing Unit: 2G Nursing Unit Phone Number: [phone] Advance Directive: No Directive Discharge Transportation: Private vehicle  ANTICIPATED DISCHARGE NEEDS  Anticipated needs: No Needs, Other (Comment) (may change pending  trajectory) Anticipated Disposition: Patient's Home-Caregiver Support  Patient is medically stable to discharge on today. Patient will be discharging to home. Patient has been interviewed on 02/22/2023. Patient plans on transporting self. Patient, and all other parties have been notified of the discharge plans. No needs or concerns reported. Level of care algorithm used for appropriate discharge planning and patient will discharge to home based on patient's current needs and preference. Case Management has assessed this patient/family or caregivers' readiness, willingness and ability to provide or support self-management activities when needed after discharge from the acute care setting.  Electronically signed: Flossie Buffy, BSW 02/22/2023 9:55 AM Electronically signed by Flossie Buffy, BSW at 02/22/2023 9:57 AM EDT  Back to top of Progress Notes Katherene Ponto, LPN - 86/57/8469 12:58 PM EDT Formatting of this note might be different from the original. Patient with one nosebleed early this AM; resolved. LBM 02/21/23. Patient ambulatory and ambulating halls.  Problem: Fall Prevention Goal: Absence of falls Outcome: Progressing  Problem: Nausea/Vomiting Goal: Absence of nausea Outcome: Progressing Goal: Absence of vomiting Outcome: Progressing Goal: Adequate hydration Outcome: Progressing Goal: Adequate nutritional intake Outcome: Progressing Goal: Decrease number of vomiting episodes Outcome: Progressing  Problem: Discharge Planning Goal: Knowledge of medical problems (What is my main problem?) Outcome: Progressing Goal: Knowledge of self care (What do I need to do when I go home?) Outcome: Progressing Goal: Knowledge of treatment plan (Why is it important for me to do this?) Outcome: Progressing Goal: Knowledge of medication management Outcome: Progressing  Problem: Injury Risk, Abnormal Glucose Level Goal: Glucose level within specified parameters Outcome:  Progressing  Problem: Sensory Perception - Impaired Goal: Absence of physical injury Outcome: Progressing  Electronically signed by Katherene Ponto, LPN at 62/95/2841 12:59 PM EDT  Back to top of Progress Notes Katherene Ponto, LPN - 32/44/0102 12:29 PM EDT Formatting of this note might be different from the original. Refused several of morning medications today.  Problem: Fall Prevention Goal: Absence of falls Outcome:  Progressing  Problem: Nausea/Vomiting Goal: Absence of nausea Outcome: Progressing Goal: Absence of vomiting Outcome: Progressing Goal: Adequate hydration Outcome: Progressing Goal: Adequate nutritional intake Outcome: Progressing Goal: Decrease number of vomiting episodes Outcome: Progressing  Problem: Discharge Planning Goal: Knowledge of medical problems (What is my main problem?) Outcome: Progressing Goal: Knowledge of self care (What do I need to do when I go home?) Outcome: Progressing Goal: Knowledge of treatment plan (Why is it important for me to do this?) Outcome: Progressing Goal: Knowledge of medication management Outcome: Progressing  Problem: Injury Risk, Abnormal Glucose Level Goal: Glucose level within specified parameters Outcome: Progressing  Problem: Sensory Perception - Impaired Goal: Absence of physical injury Outcome: Progressing  Electronically signed by Katherene Ponto, LPN at 40/98/1191 12:30 PM EDT  Back to top of Progress Notes Marijo File, PA - 02/21/2023 11:21 AM EDT Formatting of this note is different from the original. [facility]  Gastroenterology Progress Note [facility]  Assessment Nausea and vomiting Abdominal pain Rheumatoid arthritis Status post sleeve gastrectomy History of Candida esophagitis  This is a 52 year old female that we are asked to see regarding abdominal pain and nausea vomiting by Dr. Evelene Croon. We saw the patient on February 17, 2023. She reported having  trouble with vomiting and odynophagia at that time with multiple episodes of Candida esophagitis. The patient has had extensive GI workup at [facility] including multiple EGDs and colonoscopies which I we will copy from previous H&P. With our group she had an upper endoscopy 11/2022 with mild dilatation in the whole esophagus and esophageal plaques suspicious for candidiasis. Empiric dilatation was performed and the patient was started on Diflucan. Today she reports having history of constipation and she can go up to a week without a bowel movement. She has tried [place] which does not work for her and gives her side effects. She gets significant cramping and spasm in her lower abdomen if she feels like she is constipated. She did have a good bowel movement this morning and her cramping abdominal pain has resolved at this point. She denies any rectal bleeding, melena, hematemesis. She has tried Colace in the past however last night they gave her liquid Colace and this made her sick. I reviewed a CT scan of her abdomen pelvis 02/11/2023 with no acute findings. She also has been seen by a general surgeon Dr. Buzzy Han who is considering hiatal hernia repair with a robotic approach and reducing the sleeve back into the abdomen. They also considered possible revisional surgery in the form of a gastric diversion.  From our standpoint I am hopeful getting her bowels moving better with a daily laxative will help prevent these intestinal spasm/pain spells.  Her workup at [facility] has included:  -03/29/2019: EGD: Grade B reflux esophagitis, irregular Z-line, erythematous mucosa in the antrum and evidence of gas sleeve gastrectomy. Gastric biopsy showed chronic active gastritis negative H. pylori. No evidence of metaplasia. Esophageal biopsies showed reflux esophagitis.  - 07/12/2019: Colonoscopy for history of colon polyps: IH, Large amount of stool precluding visualization- lavaged, 8 mm serrated polyp in ascending colon sessile.  Repeat one year.  -09/04/2019: EGD : Hematemesis:  -10/16/2021: EGD: Dr. Pixie Casino at [facility]: Indication regurgitation, evaluation of LES distensibility and esophageal body motility using FLIP. History of sleeve gastrectomy: Impression: Mild dilation in the entire esophagus. Esophageal plaques were found suspicious for candidiasis. Esophageal mucosal changes consistent with Barrett's esophagus. Z-line 33 cm from incisors. The GEJ was widely patulous throughout  the entire procedure. Gastroesophageal flap valve classified as Hill grade 2 (fold present, opens with respiration), a sleeve gastrectomy was found characterized by healthy-appearing mucosa. Normal examined duodenum. Flip imaging performed. Suggestive of normal esophagus oh esophagogastric junction opening. Recommendation for pH monitoring over the next 48 hours. Avoid all and acid medicines for the duration of the study.  -03/31/2022: EGD: Dr. Orland Mustard [facility]: Indication dysphagia: Impression: The esophagus was normal. Evidence of a sleeve gastrectomy found in the gastric body. A deformity was found in the cardia a pocket like cavity with narrowing of the lumen of the stomach as a result. The examined duodenum was normal.  -04/01/2022: UGI barium swallow: -Distended thoracic esophagus with narrowing of the EG junction where there is a curvilinear defect involving the gastric cardia and EG junction (possibly postoperative). -As a result of this narrowing, there was stasis of barium liquid and previously ingested contents in the distal esophagus, with very little if any passage of barium through the EG junction. -Extensive esophagoesophageal reflux. -The EG junction appears located above the diaphragm and therefore a small hiatal hernia is suspected.  -04/14/2022: CT noncontrast stone protocol: Indication: RUQ abdominal pain. Impression: interval removal of right ureteral stent and resolution of right hydronephrosis. Extensive  thickening of the distal esophagus above a hiatal hernia question reflux esophagitis. Slight increased dependent density gallbladder, not apparent on recent study most likely artifactual but could reflect false small stones. Gallbladder ultrasound could be considered.  -04/14/2022: Ultrasound gallbladder: Indication right upper quadrant pain impression: Gallbladder wall measures 2.6 mm which is not thickened. No cholelithiasis or pericholecystic fluid identified. No abnormality identified.  -05/20/2022: EGD: Dr. Ranjan Iraq at [facility]: Indication: epigastric abdominal pain, nausea with vomiting: Impression: Fluid in the distal esophagus. A sleeve gastrectomy was found characterized by moderate stenosis. Biopsied. Path: chronic gastritis, Neg HP  - 06/14/22: She last saw Dr. Iraq at [facility]:'s office note reviewed for intolerable reflux following sleeve gastrectomy. He had reviewed the EGD findings showing dilated esophagus with fluid in it, close to the GE junction with corkscrewing of a sleeve a moderate stenosis in that location. He also reviewed the upper GI results as noted in above summary. 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  -06/23/2022: EGD by Dr. Saintclair Halsted at [facility] : Nausea vomiting, status post gastric bypass resulting in persistent nausea vomiting: Impression: Esophageal plaques suspicious for candidiasis. 4 cm hiatal hernia. A sleeve gastrectomy was found with a narrowed gastric body- Dilated. Normal examined duodenum. Advised PPI twice daily, scheduled Zofran, if no improvement consider low-dose Elavil. Consider upper GI series if abnormal would engage surgery for discussion. Examined  duodenum was normal. Pathology: Squamous mucosa with acute esophagitis. Micro-organisms consistent with Candida species are present.  [facility] recommended surgical management for cardia pocket and felt the prior workup including FLIP was sufficient to rule out achalasia and she did not need endoscopic placed manometry or further pH studies.  She had a colonoscopy in 2020 and was recommended repeat in 1 year. She was recommended at least a 2-day prep however she has been putting this off because she is not able to prep until her upper GI issues have been controlled. Per review of the records it seems like  she was not able to undergo surgical revision to repair her GI issues due to lack of insurance.  Plan Continue Pepcid 40 mg daily Diet as tolerated Adding Linzess 72 mcg daily. Adding dicyclomine 10 mg up to 3 times daily as needed if she has intestinal spasm/pain She can follow-up with Dr. Buzzy Han who is her surgeon in the outpatient setting.  I personally saw and examined the patient and spent 12 minutes face-to-face for this encounter. (excluding separately reportable procedure time and any overlapping time). Total time spent in care of this patient 30.  Subjective Resting comfortably in her chair. Denies any significant abdominal pain at this time. She had a good bowel movement this morning. She has been dealing with some constipation issues recently. No rectal bleeding, melena, hematemesis.  Physical Examination  Vitals: 02/21/23 0747 BP: (!) 145/91 Pulse: 78 Resp: 18 Temp: 98.6 F (37 C) SpO2: 97%  O2 Device: None (Room air)   Intakes & Outputs (Last 24 hours) at 02/21/2023 0700 Last data filed at 02/21/2023 0550 24hr Volume @0700  Intake 240 mL Output -- Net 240 mL  General appearance: alert, appears stated age, and cooperative Head: Normocephalic, without obvious abnormality, atraumatic Lungs: clear to auscultation bilaterally Heart: regular rate and rhythm, S1, S2  normal, no murmur, click, rub or gallop Abdomen: soft, non-tender; bowel sounds normal; no masses, no organomegaly Extremities: extremities normal, atraumatic, no cyanosis or edema Pulses: 2+ and symmetric Skin: Skin color, texture, turgor normal. No rashes or lesions  Medications Medications reviewed as follows:  [Held by Provider] clopidogrel bisulfate, 75 mg, Daily famotidine, 40 mg, Daily hydroxychloroquine, 200 mg, Daily isosorbide mononitrate, 15 mg, Daily mirabegron, 50 mg, Daily montelukast, 10 mg, BID mupirocin, , BID potassium chloride, 20 mEq, ONCE  acetaminophen, 650 mg, Q6H PRN acetaminophen, 650 mg, Q6H PRN albuterol sulfate, 2.5 mg, Q6H PRN diclofenac sodium, 2 g, QID PRN docusate sodium, 100 mg, BID PRN docusate sodium, 100 mg, BID PRN nitroGLYCERIN, 0.4 mg, Q5 MIN PRN ondansetron, 4 mg, Q6H PRN SUMAtriptan succinate, 100 mg, PRN  Results Labs reviewed as follows.  No results found for this or any previous visit (from the past 24 hour(s)).  Imaging Available imaging was reviewed independently XR Sinus 3+ Views  Result Date: 02/20/2023 TECHNIQUE: XR SINUS 3+ VIEWS INDICATION: Headache COMPARISON: None FINDINGS: The paranasal sinuses appear aerated without air-fluid levels.  IMPRESSION: The paranasal sinuses appear aerated without air-fluid levels. Electronically Signed by: Vonda Antigua, MD on 02/20/2023 3:05 PM  Electronically signed: Jones Skene Pleasant, PA 02/21/2023 / 11:21 AM   Electronically signed by Morton Stall, MD at 02/21/2023 12:01 PM EDT  Associated attestation - Tanna Savoy Oley Balm, MD - 02/21/2023 12:01 PM EDT Formatting of this note might be different from the original. I personally performed a face to face diagnostic evaluation of the patient,reviewed the EMR including review of the history, review of systems, medications, data, laboratory data, diagnostic studies that are mentioned and documented in the note. I agree with the note below.  Corrections/additions have been made to the above note, if needed. I discussed the diagnosis and plan of care with the patient and/or family when possible. I performed the Medical Decision Making. Recommendations as noted.  Physical exam: Alert and oriented x3 Regular respirations Abdomen soft, nontender, and nondistended  reconsulted on this 52 year old female with multiple gastrointestinal issues and chronic pain/nausea/vomiting. She has had extensive workup and I note she has had over 15 emergency department visits since February. Currently, she reports that she  is feeling much better since having a bowel movement. She would like to try some medication for constipation which she thinks would be helpful.  We are still awaiting the results from the biopsies and brushings on Fridays.  I advised her symptoms are chronic and likely will not be cured during this hospital stay. I noted she will need continued follow-up as an outpatient but is reasonable to try some Linzess and dicyclomine to see if we can help her discomfort and bowel movements. She stated she understood and agreed with this plan. I am hopeful this medication will work but may take some time. She is in agreement to follow-up as an outpatient.  15 minutes spent in patient care for this encounter including any face to face time, reviewing records and coordinating care (excluding separately reportable procedure time and any overlapping time).  Back to top of Progress Notes Kayleen Memos, MD - 02/21/2023 11:19 AM EDT Formatting of this note is different from the original. NICS Progress Note [facility] General Medicine Progress Note  Date of Admission: 02/16/2023 Length of Stay: 5 Days Chief Complaint:many requests  Subjective/Events Overnight: Patient indicated she would like to discontinue IV fluids today wants permission to leave the floor to get food from downstairs Prefers the Colace gelcaps to the liquid Has  requested to speak to GI and infectious disease notes were messaged Put in another call to her surgeons office today she has not heard from them nor have I No evidence for sinus infection on the [facility]' view  Objective  Vital signs in last 24 hours: Intake/Output: Temp: [98.4 F (36.9 C)-99.4 F (37.4 C)] 98.6 F (37 C) Heart Rate: [78-101] 78 Resp: [17-20] 18 BP: (136-150)/(81-99) 145/91 SpO2: [95 %-97 %] 97 % O2 Flow Rate (L/min): 4 L/min  Wt Readings from Last 1 Encounters: 02/21/23 103.3 kg (227 lb 11.8 oz)  Weight change: Intake/Output Summary (Last 24 hours) at 02/21/2023 1119 Last data filed at 02/21/2023 0950 Gross per 24 hour Intake 360 ml Output 1 ml Net 359 ml    Physical Exam Constitutional - resting comfortably, no acute distress Eyes - pupils equal round and reactive to light and accomodation, extraocular movements intact Nose - no gross deformity or drainage Mouth - no oral lesions noted Throat - no swelling or erythema  CV - (+)S1S2, no murmurs, no peripheral edema, no JVD Resp - CTA bilaterally, no wheezing or crackles, no clubbing, cyanosis GI - (+)BS, soft, non-tender, non-distended MSK - ROM normal Skin - no rashes or wounds Neuro - alert, aware, oriented to person/place/time Psych - normal affect and mood  Recent Labs Units 02/17/23 1511 WBC thou/mcL 8.6 HGB gm/dL 16.1 HCT % 09.6 PLT thou/mcL 234  Recent Labs Units 02/19/23 1132 NA mmol/L 136 K mmol/L 3.3* CL mmol/L 102 CO2 mmol/L 23 BUN mg/dL 6 CREATININE mg/dL 0.45* CALCIUM mg/dL 8.8  No results for input(s): "MAGNESIUM" in the last 168 hours. No results for input(s): "TSH", "T4", "HGBA1C" in the last 168 hours. Recent Labs Units 02/19/23 1132 BILITOT mg/dL 4.09 AST U/L 24 ALT U/L 29 ALKPHOS U/L 66 ALBUMIN gm/dL 3.1*  Recent Labs Units 02/15/23 2055 INR 1.0 PTT second(s) 27  No results for input(s): "CHOL", "LDL", "HDL", "TRIG" in the last 168 hours. Recent  Labs Units 02/19/23 1132 02/18/23 0825 02/16/23 1654 02/15/23 2055 GLUCOSE mg/dL 91 86 811* 914*  No results for input(s): "TROPONIN", "CK" in the last 168 hours.  Invalid input(s): "CK-MB" No results for input(s): "BNP"  in the last 168 hours.  Pertinent Radiological findings (summarize): Above independently reviewed by me  [Held by Provider] clopidogrel bisulfate 75 mg Oral Daily famotidine 40 mg Oral Daily hydroxychloroquine 200 mg Oral Daily isosorbide mononitrate 15 mg Oral Daily mirabegron 50 mg Oral Daily montelukast 10 mg Oral BID mupirocin Intranasal BID potassium chloride 20 mEq Oral ONCE  acetaminophen, acetaminophen, albuterol sulfate, diclofenac sodium, docusate sodium, docusate sodium, nitroGLYCERIN, ondansetron, SUMAtriptan succinate  Assessment/Plan: Kaiyah Eber is a 52 y.o. 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 n: 1. 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 Sero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 arthritis 3. Apparent abnormal coronary calcium score for which she is taking Plavix This may warrant further deliberation after discharge I stopped it with a nosebleed 4 Waters' view of the sinuses she is concerned she has a sinus infection she does not  Fluids, electrolytes, nutrition Fluids: KVO, monitor lytes and replete prn, Diet: Regular Diet No Red Liquids Percent Meals  Eaten (%): 50 % Prophylaxis Prophylaxis: DVT: not indicated due to bleeding risk GI: PPI General  PCP: Ria Clock, NP [phone] Specialists: Reconsulted GI and infectious disease at patient request      Disposition ??? Discussed plan of care with I have discussed the diagnoses and care plan with patient and staff  Kayleen Memos, MD 02/21/2023 11:19 AM   Electronically signed by Kayleen Memos, MD at 02/21/2023 11:21 AM EDT  Back to top of Progress Notes Kayleen Memos, MD - 02/20/2023 7:41 AM EDT Formatting of this note is different from the original. NICS Progress Note [facility] General Medicine Progress Note  Date of Admission: 02/16/2023 Length of Stay: 4 Days Chief Complaint:nosebleed  Subjective/Events Overnight: Patient had a nosebleed overnight and coupled with her headache she thinks she may have a sinus infection She is on Plavix which I restarted I asked her what she was taking for and she indicated she has atherosclerosis On further questioning it sounds like this was a coronary calcium score she never had a follow-up stress test for to see if it was significant or not and I think Plavix may be a little aggressive for that Putting it back on hold with nosebleed she think she could do better with a regular diet and allowing her to pick and choose  She wants to wait for her biopsies before leaving the hospital I am not optimistic she is going to get a visit from her surgeon while she is here in the hospital She can maintain hydration and nutrition she should follow-up with him as an outpatient assuming no in-house consultation If she cannot we should consider Dobbhoff tube  Objective  Vital signs in last 24 hours: Intake/Output: Temp: [98.4 F (36.9 C)-99.7 F (37.6 C)] 99.7 F (37.6 C) Heart Rate: [67-91] 91 Resp: [16-19] 16 BP: (124-145)/(70-91) 125/70 SpO2: [96 %-99 %] 97 % O2 Flow Rate (L/min): 4 L/min  Wt Readings from Last 1  Encounters: 02/16/23  102 kg (224 lb 13.9 oz)  Weight change: No intake or output data in the 24 hours ending 02/20/23 0741    Physical Exam Constitutional - resting comfortably, no acute distress Eyes - pupils equal round and reactive to light and accomodation, extraocular movements intact Nose - no gross deformity or drainage Mouth - no oral lesions noted Throat - no swelling or erythema  CV - (+)S1S2, no murmurs, no peripheral edema, no JVD Resp - CTA bilaterally, no wheezing or crackles, no clubbing, cyanosis GI - (+)BS, soft, non-tender, non-distended MSK - ROM normal Skin - no rashes or wounds Neuro - alert, aware, oriented to person/place/time Psych - normal affect and mood  Recent Labs Units 02/17/23 1511 WBC thou/mcL 8.6 HGB gm/dL 40.9 HCT % 81.1 PLT thou/mcL 234  Recent Labs Units 02/19/23 1132 NA mmol/L 136 K mmol/L 3.3* CL mmol/L 102 CO2 mmol/L 23 BUN mg/dL 6 CREATININE mg/dL 9.14* CALCIUM mg/dL 8.8  No results for input(s): "MAGNESIUM" in the last 168 hours. No results for input(s): "TSH", "T4", "HGBA1C" in the last 168 hours. Recent Labs Units 02/19/23 1132 BILITOT mg/dL 7.82 AST U/L 24 ALT U/L 29 ALKPHOS U/L 66 ALBUMIN gm/dL 3.1*  Recent Labs Units 02/15/23 2055 INR 1.0 PTT second(s) 27  No results for input(s): "CHOL", "LDL", "HDL", "TRIG" in the last 168 hours. Recent Labs Units 02/19/23 1132 02/18/23 0825 02/16/23 1654 02/15/23 2055 GLUCOSE mg/dL 91 86 956* 213*  No results for input(s): "TROPONIN", "CK" in the last 168 hours.  Invalid input(s): "CK-MB" No results for input(s): "BNP" in the last 168 hours.  Pertinent Radiological findings (summarize): Above independently reviewed by me  [Held by Provider] clopidogrel bisulfate 75 mg Oral Daily famotidine 40 mg Oral Daily hydroxychloroquine 200 mg Oral Daily isosorbide mononitrate 15 mg Oral Daily mirabegron 50 mg Oral Daily montelukast 10 mg Oral BID mupirocin  Intranasal BID potassium chloride 20 mEq Oral ONCE  LR 100 mL/hr (02/20/23 0504)  acetaminophen, acetaminophen, albuterol sulfate, diclofenac sodium, nitroGLYCERIN, ondansetron, SUMAtriptan succinate  Assessment/Plan: Puanani Gene is a 52 y.o. 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 Sero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 arthritis 3. Apparent abnormal coronary calcium score for which she is taking Plavix This may warrant further deliberation after discharge 4 Waters' view of the sinuses she is concerned she has a sinus infection  Fluids, electrolytes, nutrition Fluids: KVO, monitor lytes and replete prn, Diet: Regular Diet No Red Liquids Percent Meals Eaten (%): 60 % Prophylaxis Prophylaxis: DVT: not indicated due to bleeding risk GI: PPI General  PCP: Ria Clock, NP [phone] Specialists: May be back tomorrow?      Disposition Discussed plan of care with I have discussed the diagnoses and care plan with patient and staff  Kayleen Memos, MD 02/20/2023 7:41 AM   Electronically signed by Kayleen Memos, MD at 02/20/2023 7:45 AM EDT  Back to top of Progress Notes Kayleen Memos, MD - 02/19/2023 10:20 AM EDT Formatting of this note is different from the original. NICS Progress Note [facility] General Medicine Progress Note  Date of Admission: 02/16/2023 Length of Stay: 3 Days Chief Complaint:vomiting  Subjective/Events  Overnight: Patient was heard retching in the bathroom this morning I was present when she was on the phone with her bariatric surgeons office yesterday provided by number neither she nor I heard back from anybody  She does not wish to leave the hospital We did establish that her bariatric surgeon has privileges at this facility  Will plan to continue IV fluids she would prefer to take her own GERD medication and would like some nutritional supplements  Esophageal biopsies are pending  Objective  Vital signs in last 24 hours: Intake/Output: Temp: [98.3 F (36.8 C)-98.8 F (37.1 C)] 98.8 F (37.1 C) Heart Rate: [76-88] 81 Resp: [15-19] 19 BP: (112-145)/(70-91) 145/91 SpO2: [94 %-96 %] 96 % O2 Flow Rate (L/min): 4 L/min  Wt Readings from Last 1 Encounters: 02/16/23 102 kg (224 lb 13.9 oz)  Weight change: Intake/Output Summary (Last 24 hours) at 02/19/2023 1020 Last data filed at 02/18/2023 1859 Gross per 24 hour Intake 120 ml Output -- Net 120 ml    Physical Exam Constitutional - resting comfortably, no acute distress Eyes - pupils equal round and reactive to light and accomodation, extraocular movements intact Nose - no gross deformity or drainage Mouth - no oral lesions noted Throat - no swelling or erythema  CV - (+)S1S2, no murmurs, no peripheral edema, no JVD Resp - CTA bilaterally, no wheezing or crackles, no clubbing, cyanosis GI - (+)BS, soft, non-tender, non-distended MSK - ROM normal Skin - no rashes or wounds Neuro - alert, aware, oriented to person/place/time Psych - normal affect and mood  Recent Labs Units 02/17/23 1511 WBC thou/mcL 8.6 HGB gm/dL 78.2 HCT % 95.6 PLT thou/mcL 234  Recent Labs Units 02/16/23 1654 NA mmol/L 140 K mmol/L 3.3* CL mmol/L 102 CO2 mmol/L 26 BUN mg/dL 13 CREATININE mg/dL 2.13 CALCIUM mg/dL 9.9  No results for input(s): "MAGNESIUM" in the last 168 hours. No results for input(s): "TSH", "T4", "HGBA1C" in the last  168 hours. Recent Labs Units 02/16/23 1654 BILITOT mg/dL 0.86 AST U/L 30 ALT U/L 65* ALKPHOS U/L 91 ALBUMIN gm/dL 4.0  Recent Labs Units 02/15/23 2055 INR 1.0 PTT second(s) 27  No results for input(s): "CHOL", "LDL", "HDL", "TRIG" in the last 168 hours. Recent Labs Units 02/18/23 0825 02/16/23 1654 02/15/23 2055 GLUCOSE mg/dL 86 578* 469*  No results for input(s): "TROPONIN", "CK" in the last 168 hours.  Invalid input(s): "CK-MB" No results for input(s): "BNP" in the last 168 hours.  Pertinent Radiological findings (summarize): Above independently reviewed by me  clopidogrel bisulfate 75 mg Oral Daily famotidine 40 mg Oral Daily hydroxychloroquine 200 mg Oral Daily isosorbide mononitrate 15 mg Oral Daily mirabegron 50 mg Oral Daily montelukast 10 mg Oral BID mupirocin Intranasal BID  LR 75 mL/hr (02/18/23 1938)  acetaminophen, acetaminophen, albuterol sulfate, diclofenac sodium, nitroGLYCERIN, ondansetron, SUMAtriptan succinate  Assessment/Plan: Renelle Stegenga is a 52 y.o. 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 an: 1. Nausea and vomiting she has had stubborn issues with Candida esophagitis despite the fact the organisms have been sensitive to the prescribed agents This may be multifactorial she has a gastric sleeve and presbyesophagus For endoscopy today and ID consulted Hoping to  hear something from her bariatric surgeon Patient may take her own PPI nutrition consult for supplements and continue IV fluids trend electrolytes and albumin 2. 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 History of migraines 4 multiple drug allergies  Fluids, electrolytes, nutrition Fluids: KVO, monitor lytes and replete prn, Diet: GI Soft/Low Fiber Diet No Red Liquids Percent Meals Eaten (%): 0 % Prophylaxis Prophylaxis: DVT: not indicated as patient ambulatory GI: PPI General  PCP: Ria Clock, NP [phone] Specialists:      Disposition Discussed plan of care with I have discussed the diagnoses and care plan with patient and staff  Kayleen Memos, MD 02/19/2023 10:20 AM   Electronically signed by Kayleen Memos, MD at 02/19/2023 10:23 AM EDT  Back to top of Progress Notes Kayleen Memos, MD - 02/18/2023 7:45 AM EDT Formatting of this note is different from the original. NICS Progress Note [facility] General Medicine Progress Note  Date of Admission: 02/16/2023 Length of Stay: 2 Days Chief Complaint:ok  Subjective/Events Overnight: The patient will be going off shortly for endoscopy If no Candida that sounds like she may need reevaluation by her bariatric surgeon not sure that they have privileges here or not I did go ahead and formally consult infectious disease  Objective  Vital signs in last 24 hours: Intake/Output: Temp: [97.5 F (36.4 C)-99 F (37.2 C)] 97.7 F (36.5 C) Heart Rate: [67-89] 80 Resp: [15-20] 18 BP: (121-156)/(72-97) 140/90 SpO2: [92 %-100 %] 93 %   Wt Readings from Last 1 Encounters: 02/16/23 102 kg (224 lb 13.9 oz)  Weight change: Intake/Output Summary (Last 24 hours) at 02/18/2023 0745 Last data filed at 02/18/2023 0057 Gross per 24 hour Intake 1582.44 ml Output 1 ml Net 1581.44 ml    Physical Exam Constitutional - resting comfortably, no acute distress Eyes - pupils equal round and reactive to light and accomodation, extraocular movements intact Nose - no gross deformity or drainage Mouth - no oral lesions noted Throat - no swelling or  erythema  CV - (+)S1S2, no murmurs, no peripheral edema, no JVD Resp - CTA bilaterally, no wheezing or crackles, no clubbing, cyanosis GI - (+)BS, soft, non-tender, non-distended MSK - ROM normal Skin - no rashes or wounds Neuro - alert, aware, oriented to person/place/time Psych - normal affect and mood  Recent Labs Units 02/17/23 1511 WBC thou/mcL 8.6 HGB gm/dL 09.8 HCT % 11.9 PLT thou/mcL 234  Recent Labs Units 02/16/23 1654 NA mmol/L 140 K mmol/L 3.3* CL mmol/L 102 CO2 mmol/L 26 BUN mg/dL 13 CREATININE mg/dL 1.47 CALCIUM mg/dL 9.9  Recent Labs Units 02/11/23 2114 02/11/23 1355 MAGNESIUM mg/dL 2.0 2.0  No results for input(s): "TSH", "T4", "HGBA1C" in the last 168 hours. Recent Labs Units 02/16/23 1654 BILITOT mg/dL 8.29 AST U/L 30 ALT U/L 65* ALKPHOS U/L 91 ALBUMIN gm/dL 4.0  Recent Labs Units 02/15/23 2055 INR 1.0 PTT second(s) 27  No results for input(s): "CHOL", "LDL", "HDL", "TRIG" in the last 168 hours. Recent Labs Units 02/16/23 1654 02/15/23 2055 02/11/23 2114 02/11/23 1355 GLUCOSE mg/dL 562* 130* 865* 784*  No results for input(s): "TROPONIN", "CK" in the last 168 hours.  Invalid input(s): "CK-MB" No results for input(s): "BNP" in the last 168 hours.  Pertinent Radiological findings (summarize): Above independently reviewed by me  [Held by Provider] clopidogrel bisulfate 75  mg Oral Daily famotidine 40 mg Oral Daily hydroxychloroquine 200 mg Oral Daily isosorbide mononitrate 15 mg Oral Daily mirabegron 50 mg Oral Daily montelukast 10 mg Oral BID mupirocin Intranasal BID pantoprazole 40 mg IntraVENous BID  LR 75 mL/hr (02/18/23 0057)  acetaminophen, acetaminophen, albuterol sulfate, diclofenac sodium, nitroGLYCERIN, ondansetron, SUMAtriptan succinate  Assessment/Plan: Claribel Sachs is a 52 y.o. White or Caucasian [1] female with:  Assessment: Principal Problem: Esophagitis Active Problems: Nausea and vomiting,  unspecified vomiting type Rheumatoid arthritis of multiple sites with 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 factor (*) S/P laparoscopic sleeve gastrectomy Candidal esophagitis (*) Multiple drug allergies  Plan: 1. an: 1. Nausea and vomiting she has had stubborn issues with Candida esophagitis despite the fact the organisms have been sensitive to the prescribed agents This may be multifactorial she has a gastric sleeve and presbyesophagus For endoscopy today and ID consulted2. Seronegative RA she has discontinued her immunosuppressants and is having a lot of trouble Trialing Plaquenil topical NSAIDs OT evaluation she is having difficulty typing which she is involved with her livelihood 3. History of migraines her headache yesterday has resolved 4 multiple drug allergies  Fluids, electrolytes, nutrition Fluids: KVO, monitor lytes and replete prn, Diet: NPO time specified Percent Meals Eaten (%): (bites an sips) Prophylaxis Prophylaxis: DVT: not indicated due to bleeding risk GI: PPI General  PCP: Ria Clock, NP [phone] Specialists:      Disposition Discussed plan of care with I have discussed the diagnoses and care plan with patient and staff  Kayleen Memos, MD 02/18/2023 7:45 AM   Electronically signed by Kayleen Memos, MD at 02/18/2023 7:47 AM EDT  Back to top of Progress Notes Sunday Shams, RN - 02/17/2023 1:45 PM EDT Formatting of this note is different from the original. [facility] Case Management Initial Assessment  Patient: Hong Timm MR Number: [id number] Patient Date of Birth: 08-12-1970 Age/Sex: 52 y.o./female  REASON FOR ADMISSION  Esophagitis [K20.90] No admission procedures for hospital encounter.  PERTINENT HISTORY  Past Medical History: Diagnosis Date Acute deep vein thrombosis (DVT) of brachial vein of left upper extremity (*) Autoimmune disease (*) Colon polyp Diabetes (*) Hypertension Obesity Rheumatoid arthritis  (*) VTE (venous thromboembolism)  Past Surgical History: Procedure Laterality Date Colonoscopy 2020 Gastric sleeve Upper gastrointestinal endoscopy 2020  Referral Information: Referral Source: Medical staff order Password: N/A  Discussed role of CM and completed assessment with:: Patient Patient: At bedside Are you a veteran?: No Do you have a PCP?: Yes  Interpreter Needed?: Not applicable Demographics and Insurance verified with:: Chart Review  Insurance Information   [facility] CHOICE Phone: [phone] Subscriber: Shawanna, Zanders Subscriber#: [id number] Group#: [id number] Precert#: --    High risk category: Chronic disease, High Utilizer (4+ hospitalizations or 11+ ED visits within the past 12 months), Readmission Probable cause of readmission: Within 30 days Patient admitted from: Patient's Home-Caregiver Support   Functional status prior to admission: Independent - not needing assistance Receives Help From/Support System: Spouse/Significant Other   Home Living: Home Layout: Multi-level, Stairs to enter with two rails Bathroom Shower/Tub: Engineer, manufacturing systems: Standard Bathroom Accessibility: Accessible Home Equipment: Single point cane Mode of Transportation: Restaurant manager, fast food Used at Home: Respiratory equipment used at home: None  Social Determinants of Health: In the past 12 months, has lack of transportation kept you from medical appointments or from getting medications?: No In the past 12 months, has lack of transportation kept you from meetings, work, or from getting things needed for  daily living?: No Income source: Employed Any concerns managing your health?: No concerns    Assessment and Plan: Current Hospital Functional Status: Requires occassional supervision and assistance Anticipated Disposition: Patient's Home-Caregiver Support Anticipated needs: No Needs, Other (Comment) (may change pending trajectory)  Discharge  Plan: Facility/Agency Type: Home, no needs (pending trajectory) Advance Directive: No Directive    Discharge Transportation: Private vehicle  CM consult for 90day/dc planning. Pt was direct admit by Dr. Reginia Naas  Pt has had multiple ED visits fur GI issues. 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 Pt to have EGD tomorrow 7.12.24, hopeful to have surgery for sleeve stricturoplasty or gastrogastric which she has been followed by [facility].  DC plan is home, no needs at this time but this could change pending clinical trajectory. Level of care algorithm used in dc planning.    Electronically signed: Sunday Shams, RN 02/17/2023 1:45 PM Electronically signed by Sunday Shams, RN at 02/17/2023 1:57 PM EDT  Back to top of Progress Notes Sunday Shams, RN - 02/17/2023 1:45 PM EDT Formatting of this note might be different from the original.  Problem: Discharge Planning Intervention: Discharge needs assessment (edit field to enter specific needs identified) Note: Dc home, no needs at this time pending clinical improvement/trajectory Intervention: Facilitate communication re: discharge plan with patient/caregiver and pertinent members of the healthcare team Note: CM to assist with any dc needs/plan with pt and medical team  Electronically signed by Sunday Shams, RN at 02/17/2023 1:45 PM EDT  Back to top of Progress Notes Ludwig Clarks, OTR/L - 02/17/2023 9:59 AM EDT Formatting of this note is different from the original. [facility]ed Occupational Therapy - Initial Evaluation  Patient Name: Bettina Warn Date of Birth: [birth year] Today's Date: February 17, 2023  OT Diagnosis: (RA deficits)  Assessment  Prior level of function/ADL assistance Current level of functional assistance Current level of ADL  assistance Current functional limitations/ impairments Rehab potential Independent with functional mobility, Independent with ADLs, Independent with iADLs At Baseline At Baseline Decreased activity tolerance Good  Pt is a very pleasant 52 y/o female admitted with recurrent issues with Candida esophagitis. Pt with a history of RA, has been unable to have RA infusions. PLOF indep with ADLs and iADLs, works as a Pharmacist, hospital. Pt has been able to manage her RA with rest and ice. Has night wrist/hand splints for positioning. Pt with no skilled OT needs. Was educated on limiting overall hand/wrist use to "do a little/take a break" and she stated understanding. Was educated extensively on joint conservation and she has already been incorporating into daily roles. D/C skilled OT at this time.  Plan for Current Admission  Needs during current admission Treatment/Interventions OT Frequency Duration of Treatment Continued skilled OT Self care/ADL;Functional transfer training;Energy conservation (joint conservation/integrity) (no skilled OT needs)  Recommendations for Discharge  Anticipated intensity of rehab at next level of care Anticipated caregiver needs at next level of care DME Recommendations No needs anticipated None needed (has DME needed at home)  Additional considerations for discharge Decreased iADL function;Decreased activity tolerance requiring frequent rest breaks  Recommendations above align with AMPAC score of Daily Activity 24.  Recommendations provided are based on today's OT assessment, however, final discharge decisions are based on input from the interdisciplinary care team and may differ from these recommendations.  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 Nursing cleared patient to participate in therapy.  Patient left in bed with all lines/leads intact. Call bell and other needs in reach and bed alarm in place and engaged.  Family present for session? no  Activity Tolerance: good for tasks completed  Precautions Other Precautions: PIV, RA, allergies Precautions discussed with:: Patient  Home Living/Prior Function: Pt lives with her husband in a [facility] with 3-4 STE or ramp to enter. Was indep with ADLs, works as a Pharmacist, hospital  DME: WIS with seat, hand held shower hose  Pain RUE 5/10 elbow  WFL ABN COMMENTS Cognition x Fully intact P/AROM x BUEs WNLs, no RA limitations end range Some B hand stiffness  Strength x 5/5 all planes Sensation x RUE with numbness/tingling elbow to hand L hand numbness/tingling  Coordination x BUE gross and fine motor intact No noted hand RA deformities  Vision x Self-Care: Eating x Indep, able to use regular utensils Grooming x Indep Bathing x Indep, has DME needed at home Dressing-upper body x Indep Dressing-lower body x Indep Toileting x Indep Balance: Sitting x Indep static and  dynamic Standing x Indep static and dynamic Able to pick up object from the floor indep  ADL transfers x Supine<>sit indep Sit to stand indep Functional amb in bathroom and bedroom indep Toilet and shower transfers indep  Pt with no skilled OT needs. At baseline/indep for ADLs and transfers.  Today's Treatment: Pt stated difficulty with prolonged typing for her job. She was educated on limiting overall hand/wrist use to "do a little/take a break" and she stated understanding. Was educated extensively on joint conservation and she has already been incorporating into daily roles. Pt has nighttime braces for wrist/hand positioning. Pt does not have any needs at this point for adaptive utensils, etc. Pt educated on modalities to assist with comfort and decrease swelling. Pt stated understanding to all areas above, has done well with managing her symptoms. D/C OT at this time, no skilled needs.  Functional Tests  AM-PAC Daily Activity Putting on and taking off regular lower body clothing?: 4 Bathing (including washing, rinsing, drying)?: 4 Toileting, which includes using toilet, bedpan or urinal?: 4 Putting on and taking off regular upper body clothing? : 4 Taking care of personal grooming such as brushing teeth/: 4 Eating meals: 4 AM-PAC Daily Activity Raw Score (out of 24): 24  Goals OT - Patient/Support Person Stated Goals: to d/c home  Occupational Therapy Care Plan (Active)  There are no active problems.      Key (I=independent, ModI=modified independent, S=supervision, CGA=contact guard assist, Min=minimal assist, Mod=moderate assist, Max=maximal assist, D= dependent)  Plan of Care was discussed with and agreed upon by Patient/Caregiver: Yes Patient/Caregiver reported cultural and/or religious practices that should  be considered in Patient's Treatment Plan: No Patient/Caregiver was informed regarding risks and benefits of treatment: Yes  Evaluation / Treatment Time Today's  Evaluation/Treatment: 0903 - 0959 Total Time: 56 Treatment Day: 1  Charges Total Time Code Treatment Minutes: 25 Evaluation Charges $ OT Evaluation: Low Complex Therapeutic Charges $ Therapeutic Activity: 2 units  Ludwig Clarks, OTR/L 02/17/2023 10:40 AM Electronically signed by Ludwig Clarks, OTR/L at 02/17/2023 10:58 AM EDT  Back to top of Progress Notes Kayleen Memos, MD - 02/17/2023 7:48 AM EDT Formatting of this note is different from the original. NICS Progress Note [facility] General Medicine Progress Note  Date of Admission: 02/16/2023 Length of Stay: 1 Days Chief Complaint: Headache  Subjective/Events Overnight: Patient awoke with a headache this morning she thinks it is from n.p.o. I am a bit befuddled what to give her her list of allergies is daunting and I have asked her to make a suggestion what might help her headache  Quantitative immunoglobin's look fine GI consult is pending Asked OT to see if they can render any assistance with her rheumatoid arthritis she is having difficulty typing  Is a far as a tennis elbow strap she is allergic to neoprene  Objective  Vital signs in last 24 hours: Intake/Output: Temp: [97.4 F (36.3 C)-100 F (37.8 C)] 98.4 F (36.9 C) Heart Rate: [83-93] 84 Resp: [16-18] 16 BP: (132-168)/(71-121) 132/71 SpO2: [93 %-96 %] 93 %   Wt Readings from Last 1 Encounters: 02/16/23 102 kg (224 lb 13.9 oz)  Weight change: No intake or output data in the 24 hours ending 02/17/23 0748    Physical Exam Constitutional - resting comfortably, no acute distress Eyes - pupils equal round and reactive to light and accomodation, extraocular movements intact Nose - no gross deformity or drainage Mouth - no oral lesions noted Throat - no swelling or erythema  CV - (+)S1S2, no murmurs, no peripheral edema, no JVD Resp - CTA bilaterally, no wheezing or crackles, no clubbing, cyanosis GI - (+)BS, soft, non-tender,  non-distended MSK - ROM normal Skin - no rashes or wounds Neuro - alert, aware, oriented to person/place/time Psych - normal affect and mood  Recent Labs Units 02/15/23 2055 WBC thou/mcL 10.2 HGB gm/dL 57.8 HCT % 46.9 PLT thou/mcL 256  Recent Labs Units 02/16/23 1654 NA mmol/L 140 K mmol/L 3.3* CL mmol/L 102 CO2 mmol/L 26 BUN mg/dL 13 CREATININE mg/dL 6.29 CALCIUM mg/dL 9.9  Recent Labs Units 02/11/23 2114 02/11/23 1355 MAGNESIUM mg/dL 2.0 2.0  No results for input(s): "TSH", "T4", "HGBA1C" in the last 168 hours. Recent Labs Units 02/16/23 1654 BILITOT mg/dL 5.28 AST U/L 30 ALT U/L 65* ALKPHOS U/L 91 ALBUMIN gm/dL 4.0  Recent Labs Units 02/15/23 2055 INR 1.0 PTT second(s) 27  No results for input(s): "CHOL", "LDL", "HDL", "TRIG" in the last 168 hours. Recent Labs Units 02/16/23 1654 02/15/23 2055 02/11/23 2114 02/11/23 1355 02/10/23 1011 GLUCOSE mg/dL 413* 244* 010* 272* 536*  No results for input(s): "TROPONIN", "CK" in the last 168 hours.  Invalid input(s): "CK-MB" No results for input(s): "BNP" in the last 168 hours.  Pertinent Radiological findings (summarize): Above independently reviewed by me  [Held by Provider] clopidogrel bisulfate 75 mg Oral Daily famotidine 40 mg Oral Daily hydroxychloroquine 200 mg Oral Daily isosorbide mononitrate 15 mg Oral Daily mirabegron 50 mg Oral Daily montelukast 10 mg Oral BID mupirocin Intranasal BID pantoprazole sodium 40 mg Oral Daily  LR 75 mL/hr (02/17/23 0516)  acetaminophen, acetaminophen, albuterol  sulfate, diclofenac sodium, nitroGLYCERIN, ondansetron, SUMAtriptan succinate  Assessment/Plan: Naiomi Musto is a 52 y.o. White or Caucasian [1] female with:  Assessment: Principal Problem: Esophagitis Active Problems: Nausea and vomiting, unspecified vomiting type Rheumatoid arthritis of multiple sites with 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 factor (*) S/P laparoscopic sleeve gastrectomy Candidal  esophagitis (*) Multiple drug allergies  Plan: 1. Nausea and vomiting she has had stubborn issues with Candida esophagitis despite the fact the organisms have been sensitive to the prescribed agents This may be multifactorial she has a gastric sleeve and presbyesophagus Awaiting GI consultation 2. Seronegative RA she has discontinued her immunosuppressants and is having a lot of trouble Trialing Plaquenil topical NSAIDs OT evaluation she is having difficulty typing which she is involved with her livelihood 3. History of migraines she does not believe her current headache is a migraine but her n.p.o. status may be contributing GI please order diet if were not going to endoscope today I will continue her IV fluids 4 innumerable drug allergies 5 history of upper extremity VTE will start pharmacologic prophylaxis after procedure Fluids, electrolytes, nutrition Fluids: IVF, monitor lytes and replete prn, Diet: NPO time specified  Prophylaxis Prophylaxis: DVT: not indicated due to bleeding risk GI: Not indicated General  PCP: Ria Clock, NP [phone] Specialists:   Disposition Discussed plan of care with I have discussed the diagnoses and care plan with patient and staff  Kayleen Memos, MD 02/17/2023 7:48 AM

## 2023-03-01 NOTE — Telephone Encounter (Signed)
Appeal approved and sent to be scheduled with sleep center    [id number] valid til 05/02/23

## 2023-03-09 ENCOUNTER — Encounter: Payer: Self-pay | Admitting: Pulmonary Disease

## 2023-03-11 ENCOUNTER — Other Ambulatory Visit: Payer: Self-pay | Admitting: Podiatry

## 2023-03-11 NOTE — Telephone Encounter (Signed)
PCP discontinued medication

## 2023-03-14 ENCOUNTER — Telehealth: Payer: Self-pay | Admitting: Cardiology

## 2023-03-14 NOTE — Telephone Encounter (Signed)
Returned call to patient. She states she spoke with receptionist at office and was told to call back tomorrow if she had not heard anything from Dr. Vanetta Shawl office by 2pm tomorrow.  Patient states she was in the hospital from July 10th to July 16th and treated for a fungal infection. She reports hospital MD took her off of atorvastatin and plavix.  Patient reports she also had to stop taking Imdur and Plaquenil due to headaches. These have resolved, though she notes headache still occurs when BP is elevated.  Patient is concerned about being taken off of atorvastatin and plavix, as well as elevated BP readings. She states SBP has been 130's-150's, and DBP 80's-100's (more recently mid 80's diastolic).  She states she has been eating more takeout due to being unable to cook at the moment. She also states she has pain related to her rheumatoid arthritis. She verbalized stress and pain are definitely playing a factor in elevated BP, but she would like to know what to do about this.  Scheduled appt with Dr. Bing Matter for 03/23/23 to discuss.  Will forward to Dr. Bing Matter and his nurse to review and follow-up with patient.

## 2023-03-14 NOTE — Telephone Encounter (Signed)
Patient coming in to office today because she just called the phone system and left a message- see Shanell's note. Patient says if she does not receive a call by 2 PM tomorrow she will stop by again.

## 2023-03-14 NOTE — Telephone Encounter (Signed)
Pt is requesting a callback. She stated she spoke with nurse on 7/17 and was under the impression that someone was going to call her back but she still hasn't heard anything. Please advise

## 2023-03-15 NOTE — Telephone Encounter (Signed)
The pt reported being on no antifungal medications at this time. Her Plavix, Imdur and Atorvastatin are still being held. She has an appt 03-23-23. Do you want to restart meds or wait and see her on the 14th?

## 2023-03-16 ENCOUNTER — Telehealth: Payer: Self-pay | Admitting: Family

## 2023-03-16 NOTE — Telephone Encounter (Signed)
Pt called & stated that she was advised from her Rheumatologist that she needs a Kenalog shot for joint inflammation (see chart notes),. Pt wanted to come in today but I advised her that an order needed to be placed to receive the shot. Can laura approve this order for the pt or does she need to schedule an office visit? Please advise pt.

## 2023-03-16 NOTE — Telephone Encounter (Signed)
Pt decided to go to UC instead.

## 2023-03-18 NOTE — Telephone Encounter (Signed)
LVM and My Chart message to restart medications one at a time. Encouraged to call with any questions. She has appt 03-23-2023

## 2023-03-23 ENCOUNTER — Ambulatory Visit: Payer: 59 | Attending: Cardiology | Admitting: Cardiology

## 2023-03-23 VITALS — BP 142/90 | HR 75 | Ht 63.0 in | Wt 227.4 lb

## 2023-03-23 DIAGNOSIS — B3781 Candidal esophagitis: Secondary | ICD-10-CM | POA: Diagnosis not present

## 2023-03-23 DIAGNOSIS — I10 Essential (primary) hypertension: Secondary | ICD-10-CM

## 2023-03-23 DIAGNOSIS — I25118 Atherosclerotic heart disease of native coronary artery with other forms of angina pectoris: Secondary | ICD-10-CM | POA: Diagnosis not present

## 2023-03-23 DIAGNOSIS — M0609 Rheumatoid arthritis without rheumatoid factor, multiple sites: Secondary | ICD-10-CM

## 2023-03-23 MED ORDER — ATORVASTATIN CALCIUM 20 MG PO TABS: 20.0000 mg | ORAL_TABLET | Freq: Every day | ORAL | 3 refills | Status: DC

## 2023-03-23 MED ORDER — CLOPIDOGREL BISULFATE 75 MG PO TABS: 75.0000 mg | ORAL_TABLET | Freq: Every day | ORAL | 11 refills | Status: DC

## 2023-03-23 MED ORDER — CLOPIDOGREL BISULFATE 75 MG PO TABS: 75.0000 mg | ORAL_TABLET | Freq: Every day | ORAL | 3 refills | Status: DC

## 2023-03-23 MED ORDER — ATORVASTATIN CALCIUM 20 MG PO TABS: 20.0000 mg | ORAL_TABLET | Freq: Every day | ORAL | 11 refills | Status: DC

## 2023-03-23 NOTE — Addendum Note (Signed)
Addended by: Baldo Ash D on: 03/23/2023 03:34 PM   Modules accepted: Orders

## 2023-03-23 NOTE — Progress Notes (Signed)
Cardiology Office Note:    Date:  03/23/2023   ID:  [name], DOB 07-17-1971, MRN [id number]  PCP:  Olive Bass, FNP  Cardiologist:  Gypsy Balsam, MD    Referring MD: Olive Bass,*   Chief Complaint  Patient presents with   Medication Management    History of Present Illness:    Casey [name] is a 52 y.o. female past medical history significant for dyslipidemia, essential hypertension, status post laparoscopic sleeve gastric surgery done years ago she was referred to [place] because of chest pain with some suspicious characteristic coronary CT angio has been performed showed moderate stenosis of proximal LAD, fractional flow reserve was negative.  She was put on guideline directed medical therapy.  At the time echocardiac has been performed which showed ejection fraction being normal.  She went to [facility] because of candidiasis.  Both Plavix and atorvastatin was stopped by hospitalist and she was told that she does not need it because she does not have any heart trouble.  They recommended ischemia workup I do not think they ever bother to get record from our office and from epic still understand why she was on those medications obviously she is very upset about this is.  Likely she is doing well.  She denies have any chest pain tightness squeezing pressure burning chest.  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 Atopic dermatitis Formatting of this note might be different from the original. Formatting of this note might be different from the original. Atopic dermatitis Formatting of this note might be different from the original. Formatting of this note might be different  from the original. Formatting of this note might be different from the or   Cataract 05/06/2015   Contusion of left wrist 07/08/2021   Crohn's disease (HCC) 04/21/2015   De Quervain's tenosynovitis, left 07/31/2021   Diabetes mellitus without complication (HCC)    Diabetes type 2, controlled (HCC) 04/21/2015   Dry eyes 05/06/2015   DVT (deep venous thrombosis) (HCC)    Dysfunction of both eustachian tubes 08/31/2012   Encounter for monitoring immunomodulating therapy 11/20/2021   Environmental allergies 08/12/2020   Esophageal candidiasis (HCC) 06/26/2022   Fibrositis 08/28/2014   Formatting of this note might be different from the original. Fibromyalgia Formatting of this note might be different from the original. Formatting of this note might be different from the original. Fibromyalgia   Flank pain 11/24/2020   Gastroesophageal reflux disease with esophagitis without hemorrhage 08/01/2019   Formatting of this note might be different from the original. Formatting of this note might be different from the original. 07/2019: chronic symptoms of esophageal reflux since prior to sleeve gastrectomy surgery. Symptoms have worsened over the last 6 months with recent EGD findings of grade B esophagitis, irregular z line, erythematous mucosa, and evidence of sleeve gastrectomy. 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 Formatting of this note might be different from the original. 07/2019: 8  mm colonic polyp removed during colonoscopy, biopsy showed serrated polyp. Repeat colonoscopy recommended in one year. Formatting of this note might be different from the original. Added automatically from request for surgery [phone] Formatting of this note might b    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   History of kidney stones 08/12/2020   Hypercholesterolemia 10/07/2022   Hypertension, essential, benign 06/27/2015   Hypertensive disorder 08/28/2014   Formatting of this note might be different from the original. Hypertension Formatting of this note might be different from the original. Formatting of this note might be different from the original. 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 Keratoconus Formatting of this note might be different from the original. Formatting of this note might be different from the original. Keratoconus Formatting of this note might be different from the original. Formatting of this note might be different from the original. Keratoconus Formatting of this note might be different from the or   Microhematuria 06/27/2015   Migraine 08/28/2014   Formatting of this note might be different from the original. Migraine Formatting of this note might be different from the original. Formatting of this note might be different from the original. Migraine Formatting of this note might be different from the original. Migraine Formatting of this note might be different from the original. Migraine Formatting of this note might be different from the or   Migraine without aura and without status migrainosus, not intractable 08/28/2014   Formatting of this note might be different from the original. Formatting of this note might be different from the original. Formatting of this note might be different from the original. Migraine Formatting of this note might be different from the original. Migraine Formatting of this note might be different from the original. Formatting of this note might be different from the original. Migraine F   Mild intermittent asthma without  complication 10/30/2008   Formatting of this note might be different from the original. Formatting of this note might be different from the original. Formatting of this note might be different from the original. Asthma Formatting of this note might be different from the original. Asthma Formatting of this note might be different from the original. Formatting of this note might be different from the original. 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 Formatting of this note might be different from the original. 01/25/2016 with Dr. Dionisio David at [facility]. [place] of this note might be different from the original. 01/25/2016 with Dr. Dionisio David at [facility]. [place] of this note might be different from the original. Formatting of this note might be different from    Seronegative inflammatory arthritis 08/28/2014   Formatting of this note might be different from the  original. Cherlyn Roberts of this note might be different from the original. Arthritis; diagnosed as IBD related - 2012 Formatting of this note might be different from the original. Followed by rheumatologist. Had to change providers due to an insurance change earlier this year resulting in patient not being able to have Remicade for ~6 months. [place]   Seronegative  rheumatoid arthritis (HCC)    Slow transit constipation 08/01/2019   Formatting of this note might be different from the original. Formatting of this note might be different from the original. Treated with PRN miralax. Recent colonoscopy 07/2019. Formatting of this note might be different from the original. Treated with PRN miralax. Recent colonoscopy 07/2019. Formatting of this note might be different from the original. Formatting of this note might be different f   Status post placement of ureteral stent 02/06/2021   Tooth infection 08/12/2020   Ureteral stone 01/20/2022   Urinary tract infection, site not specified 06/26/2022    Past Surgical History:  Procedure Laterality Date   BACK SURGERY     L5-S1   CERVICAL CONE BIOPSY  04/2000   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   EYE SURGERY     GASTRIC BYPASS  2017   Sleve   LAPAROSCOPIC GASTRIC SLEEVE RESECTION     Low back disc surgery     06/2000   TOTAL KNEE ARTHROPLASTY Right 11/2016    Current Medications: Current Meds  Medication Sig   acetaminophen (TYLENOL) 500 MG tablet Take 1,000 mg by mouth every 12 (twelve) hours as needed for mild pain or moderate pain.   albuterol (PROVENTIL) (2.5 MG/3ML) 0.083% nebulizer solution Take 3 mLs (2.5 mg total) by nebulization every 6 (six) hours as needed for wheezing or shortness of breath.   atorvastatin (LIPITOR) 20 MG tablet Take 1 tablet (20 mg total) by mouth daily.   B Complex-C (B-COMPLEX WITH VITAMIN C) tablet Take 1 tablet by mouth daily.   Cholecalciferol (VITAMIN D3) 50 MCG (2000 UT) TABS Take 1 tablet by mouth daily.   clopidogrel (PLAVIX) 75 MG tablet Take 1 tablet (75 mg total) by mouth daily.   EPINEPHrine (EPIPEN 2-PAK) 0.3 mg/0.3 mL IJ SOAJ injection Inject 0.3 mg into the muscle as needed for anaphylaxis.   esomeprazole (NEXIUM) 40 MG capsule Take 1 capsule (40 mg total) by mouth 2 (two) times daily before a meal.   famotidine (PEPCID) 40 MG tablet Take 1 tablet (40 mg  total) by mouth daily.   Golimumab (SIMPONI ARIA IV) Inject 200 mg into the vein every 8 (eight) weeks.   isosorbide mononitrate (IMDUR) 30 MG 24 hr tablet Take 1 tablet (30 mg total) by mouth daily.   linaclotide (LINZESS) 72 MCG capsule Take 72 mcg by mouth daily before breakfast.   Loratadine-Pseudoephedrine (CLARITIN-D 24 HOUR PO) Take 1 tablet by mouth daily.   mirabegron ER (MYRBETRIQ) 50 MG TB24 tablet Take 1 tablet (50 mg total) by mouth daily.   montelukast (SINGULAIR) 10 MG tablet Take 1 tablet (10 mg total) by mouth at bedtime.   nitroGLYCERIN (NITROSTAT) 0.4 MG SL tablet Place 1 tablet (0.4 mg total) under the tongue every 5 (five) minutes as needed for chest pain.   posaconazole (NOXAFIL) 100 MG TBEC delayed-release tablet Take 300 mg by mouth daily.     Allergies:   Albuterol sulfate, Tape, Formoterol, Hydrocodone, Hydrocodone-acetaminophen, Nickel, Other, Oxycodone-acetaminophen, Salmeterol, Strawberry extract, Advair hfa [fluticasone-salmeterol], Asa [aspirin], Cleocin [clindamycin], Clindamycin/lincomycin, Fludrocortisone acetate, Gabapentin, Hydrochlorothiazide, Imipramine, Ipratropium bromide, Medroxyprogesterone, Meloxicam, Metformin and related, Nystatin,  Penicillins, Pred forte [prednisolone], Prednisone, Proventil hfa [albuterol], Solu-medrol [methylprednisolone], Sulfa antibiotics, Tegaderm ag mesh [silver], Toradol [ketorolac tromethamine], and Tramadol   Social History   Socioeconomic History   Marital status: Married    Spouse name: Not on file   Number of children: Not on file   Years of education: Not on file   Highest education level: Not on file  Occupational History   Not on file  Tobacco Use   Smoking status: Never    Passive exposure: Never   Smokeless tobacco: Never  Vaping Use   Vaping status: Never Used  Substance and Sexual Activity   Alcohol use: Yes    Comment: rare   Drug use: Never   Sexual activity: Yes  Other Topics Concern   Not on file   Social History Narrative   Not on file   Social Determinants of Health   Financial Resource Strain: Medium Risk (10/01/2022)   Received from [facility], [facility]   Overall Financial Resource Strain (CARDIA)    Difficulty of Paying Living Expenses: Somewhat hard  Food Insecurity: No Food Insecurity (02/16/2023)   Received from [facility]   Hunger Vital Sign    Worried About Running Out of Food in the Last Year: Never true    Ran Out of Food in the Last Year: Never true  Transportation Needs: No Transportation Needs (02/17/2023)   Received from [facility] - Transportation    Lack of Transportation (Medical): No    Lack of Transportation (Non-Medical): No  Physical Activity: Unknown (10/01/2022)   Received from [facility], [facility]   Exercise Vital Sign    Days of Exercise per Week: 0 days    Minutes of Exercise per Session: Not on file  Stress: No Stress Concern Present (02/16/2023)   Received from [facility] Occupational Health - Occupational Stress Questionnaire    Feeling of Stress : Not at all  Recent Concern: Stress - Stress Concern Present (01/04/2023)   Received from [facility] of Occupational Health - Occupational Stress Questionnaire    Feeling of Stress : To some extent  Social Connections: Somewhat Isolated (10/01/2022)   Received from [facility], [facility]   Social Network    How would you rate your social network (family, work, friends)?: Restricted participation with some degree of social isolation     Family History: The patient's family history includes Cancer in an other family member; Diabetes in her brother, father, mother, paternal grandmother, and sister; Hypertension in her brother, father, mother, and sister. There is no history of Asthma, Allergic rhinitis, Atopy, or Eczema. She was adopted. ROS:   Please see the history of present illness.    All 14 point  review of systems negative except as described per history of present illness  EKGs/Labs/Other Studies Reviewed:         Recent Labs: 11/30/2022: ALT 12; BUN 8; Creatinine, Ser 0.80; Hemoglobin 15.4; Platelets 342.0; Potassium 3.5; Sodium 140; TSH 0.52  Recent Lipid Panel No results found for: "CHOL", "TRIG", "HDL", "CHOLHDL", "VLDL", "LDLCALC", "LDLDIRECT"  Physical Exam:    VS:  BP (!) 142/90 (BP Location: Left Arm, Patient Position: Sitting)   Pulse 75   Ht 5\' 3"  (1.6 m)   Wt 227 lb 6.4 oz (103.1 kg)   SpO2 99%   BMI 40.28 kg/m     Wt Readings from Last 3 Encounters:  03/23/23 227  lb 6.4 oz (103.1 kg)  02/01/23 225 lb (102.1 kg)  01/18/23 230 lb (104.3 kg)     GEN:  Well nourished, well developed in no acute distress HEENT: Normal NECK: No JVD; No carotid bruits LYMPHATICS: No lymphadenopathy CARDIAC: RRR, no murmurs, no rubs, no gallops RESPIRATORY:  Clear to auscultation without rales, wheezing or rhonchi  ABDOMEN: Soft, non-tender, non-distended MUSCULOSKELETAL:  No edema; No deformity  SKIN: Warm and dry LOWER EXTREMITIES: no swelling NEUROLOGIC:  Alert and oriented x 3 PSYCHIATRIC:  Normal affect   ASSESSMENT:    1. Coronary artery disease of native artery of native heart with stable angina pectoris (HCC)   2. Hypertension, essential, benign   3. Esophageal candidiasis (HCC)   4. Rheumatoid arthritis of multiple sites with 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 factor (HCC)    PLAN:    In order of problems listed above:  Coronary artery disease stable from that point review we will restart Plavix, she is allergic to aspirin, will restart atorvastatin. Essential hypertension will continue monitoring blood pressure. Esophageal candidiasis stable right now. Rheumatoid arthritis is being put on appropriate medication again.   Medication Adjustments/Labs and Tests Ordered: Current medicines are reviewed at length with the patient today.  Concerns regarding medicines are  outlined above.  No orders of the defined types were placed in this encounter.  Medication changes: No orders of the defined types were placed in this encounter.   Signed, Georgeanna Lea, MD, [facility] 03/23/2023 3:20 PM    [facility]

## 2023-03-23 NOTE — Addendum Note (Signed)
Addended by: Baldo Ash D on: 03/23/2023 03:27 PM   Modules accepted: Orders

## 2023-03-23 NOTE — Patient Instructions (Addendum)
Medication Instructions:   START: Plavix 75mg  1 tablet daily  START: Lipitor 20mg  1 tablet daily   Lab Work: Your physician recommends that you return for lab work in: 6 weeks You need to have labs done when you are fasting.  You can come Monday through Friday 8:30 am to 12:00 pm and 1:15 to 4:30. You do not need to make an appointment as the order has already been placed. The labs you are going to have done are  Lipids.    Testing/Procedures: None Ordered   Follow-Up: At [facility], you and your health needs are our priority.  As part of our continuing mission to provide you with exceptional heart care, we have created designated Provider Care Teams.  These Care Teams include your primary Cardiologist (physician) and Advanced Practice Providers (APPs -  Physician Assistants and Nurse Practitioners) who all work together to provide you with the care you need, when you need it.  We recommend signing up for the patient portal called "MyChart".  Sign up information is provided on this After Visit Summary.  MyChart is used to connect with patients for Virtual Visits (Telemedicine).  Patients are able to view lab/test results, encounter notes, upcoming appointments, etc.  Non-urgent messages can be sent to your provider as well.   To learn more about what you can do with MyChart, go to [email or web address].    Your next appointment:   Keep October appt with Dr. Bing Matter  The format for your next appointment:   In Person  Provider:   Gypsy Balsam, MD    Other Instructions NA

## 2023-03-29 ENCOUNTER — Encounter (HOSPITAL_BASED_OUTPATIENT_CLINIC_OR_DEPARTMENT_OTHER): Payer: 59 | Admitting: Pulmonary Disease

## 2023-04-04 ENCOUNTER — Ambulatory Visit: Payer: 59 | Admitting: Pulmonary Disease

## 2023-04-17 ENCOUNTER — Ambulatory Visit (HOSPITAL_BASED_OUTPATIENT_CLINIC_OR_DEPARTMENT_OTHER): Payer: 59 | Attending: Pulmonary Disease | Admitting: Pulmonary Disease

## 2023-04-17 DIAGNOSIS — R0683 Snoring: Secondary | ICD-10-CM | POA: Diagnosis present

## 2023-04-17 DIAGNOSIS — G4733 Obstructive sleep apnea (adult) (pediatric): Secondary | ICD-10-CM

## 2023-04-20 ENCOUNTER — Telehealth: Payer: Self-pay | Admitting: Family

## 2023-04-20 NOTE — Telephone Encounter (Signed)
Pt called stating that [facility] is losing their contract with [facility] and she is wanting to have an In-Network Rheumatologist to continue her care. Pt would like to have a referral written out for a Rheumatologist in the [facility] system, if possible.

## 2023-04-21 ENCOUNTER — Telehealth: Payer: Self-pay | Admitting: Pulmonary Disease

## 2023-04-21 ENCOUNTER — Other Ambulatory Visit: Payer: Self-pay | Admitting: Family

## 2023-04-21 DIAGNOSIS — M069 Rheumatoid arthritis, unspecified: Secondary | ICD-10-CM

## 2023-04-21 NOTE — Telephone Encounter (Signed)
Casey [name] would like sleep study results faxed to them. Casey [name] phone number is [phone]. Casey [name] fax number is [phone].

## 2023-04-25 DIAGNOSIS — G4733 Obstructive sleep apnea (adult) (pediatric): Secondary | ICD-10-CM | POA: Diagnosis not present

## 2023-04-25 NOTE — Procedures (Signed)
Patient Name: Casey [name], Casey [name] Date: 04/17/2023 Gender: Female D.O.B: 1971/07/18 Age (years): 76 Referring Provider: Vilma Meckel MD Height (inches): 63 Interpreting Physician: Cyril Mourning MD, ABSM Weight (lbs): 225 RPSGT: Elaina Pattee BMI: 40 MRN: [id number] Neck Size: 14.00 <br> <br> CLINICAL INFORMATION Sleep Study Type: NPSG    Indication for sleep study: Witnessed Apneas, snoring, non refreshing sleep    Epworth Sleepiness Score:8    SLEEP STUDY TECHNIQUE As per the AASM Manual for the Scoring of Sleep and Associated Events v2.3 (April 2016) with a hypopnea requiring 4% desaturations.  The channels recorded and monitored were frontal, central and occipital EEG, electrooculogram (EOG), submentalis EMG (chin), nasal and oral airflow, thoracic and abdominal wall motion, anterior tibialis EMG, snore microphone, electrocardiogram, and pulse oximetry.  MEDICATIONS Medications self-administered by patient taken the night of the study : N/A  SLEEP ARCHITECTURE The study was initiated at 11:21:13 PM and ended at 5:17:09 AM.  Sleep onset time was 11.6 minutes and the sleep efficiency was 68.4%. The total sleep time was 243.5 minutes.  Stage REM latency was 125.5 minutes.  The patient spent 4.9% of the night in stage N1 sleep, 42.5% in stage N2 sleep, 31.6% in stage N3 and 20.9% in REM.  Alpha intrusion was absent.  Supine sleep was 4.18%.  RESPIRATORY PARAMETERS The overall apnea/hypopnea index (AHI) was 3.4 per hour. There were 0 total apneas, including 0 obstructive, 0 central and 0 mixed apneas. There were 14 hypopneas and 0 RERAs.  The AHI during Stage REM sleep was 4.7 per hour.  AHI while supine was 11.8 per hour.  The mean oxygen saturation was 93.5%. The minimum SpO2 during sleep was 87.0%.  snoring was noted during this study.  CARDIAC DATA The 2 lead EKG demonstrated sinus rhythm. The mean heart rate was 67.1 beats per minute. Other EKG  findings include: None.   LEG MOVEMENT DATA The total PLMS were 0 with a resulting PLMS index of 0.0. Associated arousal with leg movement index was 0.0 .  IMPRESSIONS - No significant obstructive sleep apnea occurred during this study (AHI = 3.4/h). - Mild oxygen desaturation was noted during this study (Min O2 = 87.0%). - No snoring was audible during this study. - No cardiac abnormalities were noted during this study. - Clinically significant periodic limb movements did not occur during sleep. No significant associated arousals.   DIAGNOSIS - No evidence of sleep disordered breathing   RECOMMENDATIONS - Avoid alcohol, sedatives and other CNS depressants that may worsen sleep apnea and disrupt normal sleep architecture. - Sleep hygiene should be reviewed to assess factors that may improve sleep quality. - Weight management and regular exercise should be initiated or continued if appropriate.   Cyril Mourning MD Board Certified in Sleep medicine

## 2023-04-26 ENCOUNTER — Ambulatory Visit (INDEPENDENT_AMBULATORY_CARE_PROVIDER_SITE_OTHER): Payer: 59 | Admitting: Allergy

## 2023-04-26 ENCOUNTER — Encounter: Payer: Self-pay | Admitting: Allergy

## 2023-04-26 VITALS — BP 112/68 | HR 85 | Resp 16

## 2023-04-26 DIAGNOSIS — J3089 Other allergic rhinitis: Secondary | ICD-10-CM

## 2023-04-26 DIAGNOSIS — H1013 Acute atopic conjunctivitis, bilateral: Secondary | ICD-10-CM | POA: Diagnosis not present

## 2023-04-26 DIAGNOSIS — J302 Other seasonal allergic rhinitis: Secondary | ICD-10-CM

## 2023-04-26 DIAGNOSIS — Z91038 Other insect allergy status: Secondary | ICD-10-CM

## 2023-04-26 DIAGNOSIS — J4530 Mild persistent asthma, uncomplicated: Secondary | ICD-10-CM

## 2023-04-26 DIAGNOSIS — T7800XD Anaphylactic reaction due to unspecified food, subsequent encounter: Secondary | ICD-10-CM

## 2023-04-26 NOTE — Patient Instructions (Addendum)
-   continue avoidance measures for grass pollen, weed pollen, tree pollen, outdoor mold, dust mite, cat, dog, mixed feathers, horse, cockroach and tobacco leaf.    - Continue with Singulair (montelukast) 10mg  daily - Xyzal daily 1-2 tabs a day For itchy/watery eyes if able you can try Pataday 1 drop each eye daily as needed for itchy/watery eyes. - Consider allergy shots as a means of long-term control.  Allergy shots "re-train" and "reset" the immune system to ignore environmental allergens and decrease the resulting immune response to those allergens (sneezing, itchy watery eyes, runny nose, nasal congestion, etc).   Allergy shots improve symptoms in 75-85% of patients.   You can call when ready to start allergy shots  - Continue avoidance of strawberry and stinging insects - Strawberry skin testing was positive.   - you are on the venom testing list and you will be notified when our test venom skin testing day is - Have access to self-injectable epinephrine (Epipen) 0.3mg  at all times - Follow emergency action plan in case of allergic reaction  - Continue as needed use of albuterol 1 vial via nebulizer - Plan to start Tezspire with Dr Judeth Horn   - Continue direction and follow-up care with your infectious disease doctor - Continue direction and follow-up care with your rheumatologist for RA management  Follow-up in 4-6 months or sooner if needed

## 2023-04-26 NOTE — Progress Notes (Signed)
Follow-up Note  RE: Casey [name] MRN: [id number] DOB: [birth year] Date of Office Visit: 04/26/2023   History of present illness: Casey [name] is a 52 y.o. female presenting today for follow-up of allergic rhinoconjunctivitis, food allergy, hymenoptera allergy, asthma.  She also has history of recurrent esophageal candidiasis and rheumatoid arthritis.  She was last seen in the office on 01/25/2023. She states her allergy symptom control has been 'up and down'.   She states the xyzal is the one she decided to take given the option between Xyzal and Allegra.   She feels like it is helpful to a degree. She states she has had 1 sinus infection since last visit.  She is still getting bloody nose more like every other week now which was weekly before.   She would like to do allergy shots but states at this time can not afford it and she was quoted to have to pay $50 co-pay every injection visit.  She wonders if there is any other alternatives to the injections.  Discussed options of systemic steroid injection however she has not tolerated several different types of steroids in the past.  She states the only one she tolerates is the Kenalog injection. Fortunately she has not had any sting since the last visit.  She also possible avoiding strawberry in the diet.  She has not had any need for epinephrine device. Has not needed albuterol use since last visit either which is great.  She has not started the Tezspire yet she is still dealing with issues with the esophageal candidiasis. She does follow with infectious disease for the esophageal candidiasis and she follows with her rheumatologist for her RA management.  Review of systems: 10pt ROS negative unless noted above in HPI  Past medical/social/surgical/family history have been reviewed and are unchanged unless specifically indicated below.  No changes  Medication List: Current Outpatient Medications  Medication Sig Dispense Refill    acetaminophen (TYLENOL) 500 MG tablet Take 1,000 mg by mouth every 12 (twelve) hours as needed for mild pain or moderate pain.     albuterol (PROVENTIL) (2.5 MG/3ML) 0.083% nebulizer solution Take 3 mLs (2.5 mg total) by nebulization every 6 (six) hours as needed for wheezing or shortness of breath. 150 mL 1   atorvastatin (LIPITOR) 20 MG tablet Take 1 tablet (20 mg total) by mouth daily. 30 tablet 11   B Complex-C (B-COMPLEX WITH VITAMIN C) tablet Take 1 tablet by mouth daily.     Cholecalciferol (VITAMIN D3) 50 MCG (2000 UT) TABS Take 1 tablet by mouth daily.     clopidogrel (PLAVIX) 75 MG tablet Take 1 tablet (75 mg total) by mouth daily. 30 tablet 11   EPINEPHrine (EPIPEN 2-PAK) 0.3 mg/0.3 mL IJ SOAJ injection Inject 0.3 mg into the muscle as needed for anaphylaxis. 2 each 2   esomeprazole (NEXIUM) 40 MG capsule Take 1 capsule (40 mg total) by mouth 2 (two) times daily before a meal. 60 capsule 11   famotidine (PEPCID) 40 MG tablet Take 1 tablet (40 mg total) by mouth daily. 30 tablet 11   Golimumab (SIMPONI ARIA IV) Inject 200 mg into the vein every 8 (eight) weeks.     isosorbide mononitrate (IMDUR) 30 MG 24 hr tablet Take 1 tablet (30 mg total) by mouth daily. 90 tablet 3   linaclotide (LINZESS) 72 MCG capsule Take 72 mcg by mouth daily before breakfast.     Loratadine-Pseudoephedrine (CLARITIN-D 24 HOUR PO) Take 1 tablet by mouth  daily.     mirabegron ER (MYRBETRIQ) 50 MG TB24 tablet Take 1 tablet (50 mg total) by mouth daily. 30 tablet 11   montelukast (SINGULAIR) 10 MG tablet Take 1 tablet (10 mg total) by mouth at bedtime. 30 tablet 11   nitroGLYCERIN (NITROSTAT) 0.4 MG SL tablet Place 1 tablet (0.4 mg total) under the tongue every 5 (five) minutes as needed for chest pain. 25 tablet 6   posaconazole (NOXAFIL) 100 MG TBEC delayed-release tablet Take 300 mg by mouth daily.     ZOLMitriptan (ZOMIG) 2.5 MG tablet Take 1 tablet (2.5 mg total) by mouth once for 1 dose. May repeat in 2 hours if  headache persists or recurs. 10 tablet 1   No current facility-administered medications for this visit.     Known medication allergies: Allergies  Allergen Reactions   Albuterol Sulfate Shortness Of Breath    Other reaction(s): Other (See Comments)  can't breathe  Other reaction(s): Other (See Comments) can't breathe   Tape     Other reaction(s): Other (See Comments), Other (See Comments), Other (See Comments)  severe skin breakdown  Cast and Bandage Cover  severe skin breakdown  severe skin breakdown   Formoterol Other (See Comments)    Other reaction(s): Headaches  Other reaction(s): Headaches  Other reaction(s): Headaches  Headaches  Other reaction(s): Headaches Other reaction(s): Headaches    Other reaction(s): Headaches    Headaches   Hydrocodone Rash   Hydrocodone-Acetaminophen Rash   Nickel Rash and Other (See Comments)   Other Rash    cast and bandage use  cast and bandage use  cast and bandage use  cast and bandage use    cast and bandage use cast and bandage use   Oxycodone-Acetaminophen Other (See Comments)    headache   Salmeterol Rash   Strawberry Extract Hives    Full body rash from strawberry   Advair Hfa [Fluticasone-Salmeterol]    Asa [Aspirin]    Cleocin [Clindamycin]    Clindamycin/Lincomycin    Fludrocortisone Acetate    Gabapentin    Hydrochlorothiazide    Imipramine    Ipratropium Bromide    Medroxyprogesterone    Meloxicam    Metformin And Related    Nystatin    Penicillins    Pred Forte [Prednisolone]    Prednisone    Proventil Hfa [Albuterol]    Solu-Medrol [Methylprednisolone]    Sulfa Antibiotics    Tegaderm Ag Mesh [Silver]    Toradol [Ketorolac Tromethamine]    Tramadol      Physical examination: Blood pressure 112/68, pulse 85, resp. rate 16, SpO2 96%.  General: Alert, interactive, in no acute distress. HEENT: PERRLA, TMs pearly gray, turbinates minimally edematous without discharge, post-pharynx non  erythematous. Neck: Supple without lymphadenopathy. Lungs: Clear to auscultation without wheezing, rhonchi or rales. {no increased work of breathing. CV: Normal S1, S2 without murmurs. Abdomen: Nondistended, nontender. Skin: Warm and dry, without lesions or rashes. Extremities:  No clubbing, cyanosis or edema. Neuro:   Grossly intact.  Diagnositics/Labs: None today  Assessment and plan: Allergic rhinitis with conjunctivitis - continue avoidance measures for grass pollen, weed pollen, tree pollen, outdoor mold, dust mite, cat, dog, mixed feathers, horse, cockroach and tobacco leaf.    - Continue with Singulair (montelukast) 10mg  daily - Xyzal daily 1-2 tabs a day For itchy/watery eyes if able you can try Pataday 1 drop each eye daily as needed for itchy/watery eyes. - Consider allergy shots as a means of long-term control.  Allergy shots "  re-train" and "reset" the immune system to ignore environmental allergens and decrease the resulting immune response to those allergens (sneezing, itchy watery eyes, runny nose, nasal congestion, etc).   Allergy shots improve symptoms in 75-85% of patients.   You can call when ready to start allergy shots  Hymenoptera allergy Food allergy - Continue avoidance of strawberry and stinging insects - Strawberry skin testing was positive.   - you are on the venom testing list and you will be notified when our test venom skin testing day is - Have access to self-injectable epinephrine (Epipen) 0.3mg  at all times - Follow emergency action plan in case of allergic reaction  Asthma - Continue as needed use of albuterol 1 vial via nebulizer - Plan to start Tezspire with Dr Judeth Horn   Recurrent esophageal candidiasis Rheumatoid arthritis - Continue direction and follow-up care with your infectious disease doctor - Continue direction and follow-up care with your rheumatologist for RA management  Follow-up in 4-6 months or sooner if needed  I appreciate the  opportunity to take part in Rhandi's care. Please do not hesitate to contact me with questions.  Sincerely,   Margo Aye, MD Allergy/Immunology [facility]

## 2023-04-26 NOTE — Telephone Encounter (Signed)
Results faxed to [phone]

## 2023-04-29 NOTE — Progress Notes (Signed)
Called and discussed results with patient.  No sleep apnea.  No cardiac abnormalities.  Did demonstrate mild oxygen desaturation.  Agreed on starting nocturnal oxygen based on results of sleep study.  Can someone please place a new order for nocturnal oxygen, 2 L at night based on result of oxygen desaturation to 87% during split-night study?

## 2023-05-02 ENCOUNTER — Telehealth: Payer: Self-pay | Admitting: Pulmonary Disease

## 2023-05-02 NOTE — Telephone Encounter (Signed)
PT states Dr. Jeanene Erb her regarding her needing O2 @ night. She states her [place] does not need a PA and wonders if we can send in a script for her. Her # is [phone]

## 2023-05-02 NOTE — Telephone Encounter (Signed)
Possibly [facility] in [place].   It is for a POC

## 2023-05-03 ENCOUNTER — Other Ambulatory Visit: Payer: Self-pay

## 2023-05-03 DIAGNOSIS — R0902 Hypoxemia: Secondary | ICD-10-CM

## 2023-05-05 ENCOUNTER — Other Ambulatory Visit (HOSPITAL_COMMUNITY): Payer: Self-pay | Admitting: Orthopedic Surgery

## 2023-05-05 ENCOUNTER — Ambulatory Visit (HOSPITAL_COMMUNITY)
Admission: RE | Admit: 2023-05-05 | Discharge: 2023-05-05 | Disposition: A | Discharge status: Home / Self Care | Payer: 59 | Source: Ambulatory Visit | Attending: Internal Medicine | Admitting: Internal Medicine

## 2023-05-05 DIAGNOSIS — M79601 Pain in right arm: Secondary | ICD-10-CM | POA: Insufficient documentation

## 2023-05-05 NOTE — Telephone Encounter (Signed)
Pt has been contacted by DME She says there had been a mix up with another patient. Nothing further needed.

## 2023-05-09 ENCOUNTER — Telehealth: Payer: Self-pay | Admitting: Pulmonary Disease

## 2023-05-09 DIAGNOSIS — R0902 Hypoxemia: Secondary | ICD-10-CM

## 2023-05-09 DIAGNOSIS — G4733 Obstructive sleep apnea (adult) (pediatric): Secondary | ICD-10-CM

## 2023-05-09 NOTE — Telephone Encounter (Signed)
Patient is calling because she uses oxygen at night. She needs a prescription to be sent to [facility] so that she can get a mask that fits her face because the nose piece keeps falling out. Please call and advise [phone]

## 2023-05-13 NOTE — Telephone Encounter (Signed)
Patient is calling back to check in on the status of her nose piece. Please call and advise.

## 2023-05-13 NOTE — Telephone Encounter (Signed)
Looks like we need an order

## 2023-05-16 NOTE — Telephone Encounter (Signed)
Updated order placed.

## 2023-06-02 LAB — LIPID PANEL
Chol/HDL Ratio: 3.1 {ratio} (ref 0.0–4.4)
Cholesterol, Total: 177 mg/dL (ref 100–199)
HDL: 58 mg/dL (ref >39)
LDL Chol Calc (NIH): 94 mg/dL (ref 0–99)
Triglycerides: 145 mg/dL (ref 0–149)
VLDL Cholesterol Cal: 25 mg/dL (ref 5–40)

## 2023-06-02 LAB — ALT: ALT: 18 [IU]/L (ref 0–32)

## 2023-06-02 LAB — AST: AST: 21 [IU]/L (ref 0–40)

## 2023-06-03 ENCOUNTER — Telehealth: Payer: Self-pay

## 2023-06-03 NOTE — Telephone Encounter (Signed)
...  Pre-operative Risk Assessment    Patient Name: Casey [name]  DOB: [birth year] MRN: [id number]      Request for Surgical Clearance    Procedure:   ENDOSCOPY AND/ OR COLONOSCOPY  Date of Surgery:  Clearance 07/29/23                                 Surgeon:  DR Lina Sayre Surgeon's Group or Practice Name:  [facility] Phone number:  [phone] Fax number:  [phone]   Type of Clearance Requested:   - Medical  - Pharmacy:  Hold Clopidogrel (Plavix) HOLD FOR 4 DAYS   Type of Anesthesia:  Not Indicated   Additional requests/questions:   LAST O/V 03/23/23 NEXT APPT 06/07/23  Signed, Renee Ramus   06/03/2023, 3:29 PM

## 2023-06-03 NOTE — Telephone Encounter (Signed)
Primary Cardiologist:Robert Bing Matter, MD  Chart reviewed as part of pre-operative protocol coverage. Because of Maud L Heatley's past medical history and time since last visit, he/she will require a follow-up visit in order to better assess preoperative cardiovascular risk.  Pre-op covering staff: - Patient has an upcoming appointment on 10/29 with Dr. Bing Matter at which time clearance will be addressed. Appointment notes have been updated.  - Please contact requesting surgeon's office via preferred method (i.e, phone, fax) to inform them of need for appointment prior to surgery.    Levi Aland, NP-C  06/03/2023, 4:00 PM [address] Office [phone] Fax [phone]

## 2023-06-07 ENCOUNTER — Ambulatory Visit: Payer: 59 | Attending: Cardiology | Admitting: Cardiology

## 2023-06-07 ENCOUNTER — Encounter: Payer: Self-pay | Admitting: Cardiology

## 2023-06-07 VITALS — BP 132/74 | HR 95 | Ht 63.0 in | Wt 222.8 lb

## 2023-06-07 DIAGNOSIS — I25118 Atherosclerotic heart disease of native coronary artery with other forms of angina pectoris: Secondary | ICD-10-CM

## 2023-06-07 DIAGNOSIS — B3781 Candidal esophagitis: Secondary | ICD-10-CM

## 2023-06-07 DIAGNOSIS — R0609 Other forms of dyspnea: Secondary | ICD-10-CM

## 2023-06-07 DIAGNOSIS — I10 Essential (primary) hypertension: Secondary | ICD-10-CM | POA: Diagnosis not present

## 2023-06-07 NOTE — Patient Instructions (Signed)
Medication Instructions:  Your physician recommends that you continue on your current medications as directed. Please refer to the Current Medication list given to you today.  *If you need a refill on your cardiac medications before your next appointment, please call your pharmacy*   Lab Work: None Ordered If you have labs (blood work) drawn today and your tests are completely normal, you will receive your results only by: MyChart Message (if you have MyChart) OR A paper copy in the mail If you have any lab test that is abnormal or we need to change your treatment, we will call you to review the results.   Testing/Procedures: Your physician has requested that you have an echocardiogram. Echocardiography is a painless test that uses sound waves to create images of your heart. It provides your doctor with information about the size and shape of your heart and how well your heart's chambers and valves are working. This procedure takes approximately one hour. There are no restrictions for this procedure. Please do NOT wear cologne, perfume, aftershave, or lotions (deodorant is allowed). Please arrive 15 minutes prior to your appointment time.    Follow-Up: At [facility], you and your health needs are our priority.  As part of our continuing mission to provide you with exceptional heart care, we have created designated Provider Care Teams.  These Care Teams include your primary Cardiologist (physician) and Advanced Practice Providers (APPs -  Physician Assistants and Nurse Practitioners) who all work together to provide you with the care you need, when you need it.  We recommend signing up for the patient portal called "MyChart".  Sign up information is provided on this After Visit Summary.  MyChart is used to connect with patients for Virtual Visits (Telemedicine).  Patients are able to view lab/test results, encounter notes, upcoming appointments, etc.  Non-urgent messages can be sent to your  provider as well.   To learn more about what you can do with MyChart, go to [email or web address].    Your next appointment:   5 month(s)  The format for your next appointment:   In Person  Provider:   Gypsy Balsam, MD    Other Instructions NA

## 2023-06-07 NOTE — Progress Notes (Signed)
Cardiology Office Note:    Date:  06/07/2023   ID:  Casey [name], DOB 1971/05/07, MRN [id number]  PCP:  Olive Bass, FNP  Cardiologist:  Gypsy Balsam, MD    Referring MD: Olive Bass,*   Chief Complaint  Patient presents with   Dizziness   Fatigue   Shortness of Breath        Bleeding/Bruising    All ongoing since hernia surgery     History of Present Illness:    Casey [name] is a 52 y.o. female past medical history significant for essential hypertension, dyslipidemia, status post laparoscopic sleeve gastric surgery done years ago she was referred to [place] because of chest pain.  Coronary CT angio has been done which showed moderate stenosis of proximal LAD fractional flow reserve negative.  She was put on guideline directed medical therapy and doing quite well.  Recently she got some abdominal surgery done again to 10 doing quite fine.  Come for follow-up.  She complained of having bruising while on Plavix I explained to her why she is taking Plavix.  She is also taking statin which I encouraged her to continue.  Did her surgery quite well but complain of being weak tired exhausted and short of breath much more than previously.  Heart will give her shortness of breath  Past Medical History:  Diagnosis Date   Albuminuria 06/27/2015   Anxiety 11/04/2014   Arm DVT (deep venous thromboembolism), acute, left (HCC) 08/17/2022   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/30/2014   Asthma    Atopic dermatitis 10/30/2008   Formatting of this note might be different from the original. Atopic dermatitis Formatting of this note might be different from the original. Formatting of this note might be different from the original. Atopic dermatitis Formatting of this note might be different from the original. Formatting of this note might be different from the original. Formatting of this note might be different from the or   Cataract 05/06/2015   Contusion  of left wrist 07/08/2021   Crohn's disease (HCC) 04/21/2015   De Quervain's tenosynovitis, left 07/31/2021   Diabetes mellitus without complication (HCC)    Diabetes type 2, controlled (HCC) 04/21/2015   Dry eyes 05/06/2015   DVT (deep venous thrombosis) (HCC)    Dysfunction of both eustachian tubes 08/31/2012   Encounter for monitoring immunomodulating therapy 11/20/2021   Environmental allergies 08/12/2020   Esophageal candidiasis (HCC) 06/26/2022   Fibrositis 08/28/2014   Formatting of this note might be different from the original. Fibromyalgia Formatting of this note might be different from the original. Formatting of this note might be different from the original. Fibromyalgia   Flank pain 11/24/2020   Gastroesophageal reflux disease with esophagitis without hemorrhage 08/01/2019   Formatting of this note might be different from the original. Formatting of this note might be different from the original. 07/2019: chronic symptoms of esophageal reflux since prior to sleeve gastrectomy surgery. Symptoms have worsened over the last 6 months with recent EGD findings of grade B esophagitis, irregular z line, erythematous mucosa, and evidence of sleeve gastrectomy. 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 Formatting of this note might be different from the original. 07/2019: 8 mm colonic polyp  removed during colonoscopy, biopsy showed serrated polyp. Repeat colonoscopy recommended in one year. Formatting of this note might be different from the original. Added automatically from request for surgery [phone] Formatting of this note might b   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   History of kidney stones 08/12/2020   Hypercholesterolemia  10/07/2022   Hypertension, essential, benign 06/27/2015   Hypertensive disorder 08/28/2014   Formatting of this note might be different from the original. Hypertension Formatting of this note might be different from the original. Formatting of this note might be different from the original. 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 Keratoconus Formatting of this note might be different from the original. Formatting of this note might be different from the original. Keratoconus Formatting of this note might be different from the original. Formatting of this note might be different from the original. Keratoconus Formatting of this note might be different from the or   Microhematuria 06/27/2015   Migraine 08/28/2014   Formatting of this note might be different from the original. Migraine Formatting of this note might be different from the original. Formatting of this note might be different from the original. Migraine Formatting of this note might be different from the original. Migraine Formatting of this note might be different from the original. Migraine Formatting of this note might be different from the or   Migraine without aura and without status migrainosus, not intractable 08/28/2014   Formatting of this note might be different from the original. Formatting of this note might be different from the original. Formatting of this note might be different from the original. Migraine Formatting of this note might be different from the original. Migraine Formatting of this note might be different from the original. Formatting of this note might be different from the original. Migraine F   Mild intermittent asthma without complication 10/30/2008   Formatting of this note might be different from the original. Formatting of this note  might be different from the original. Formatting of this note might be different from the original. Asthma Formatting of this note might be different from the original. Asthma Formatting of this note might be different from the original. Formatting of this note might be different from the original. 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 Formatting of this note might be different from the original. 01/25/2016 with Dr. Dionisio David at [facility]. [place] of this note might be different from the original. 01/25/2016 with Dr. Dionisio David at [facility]. [place] of this note might be different from the original. Formatting of this note might be different from    Seronegative inflammatory arthritis 08/28/2014   Formatting of this note might be different from the original. Formatting of  this note might be different from the original. Arthritis; diagnosed as IBD related - 2012 Formatting of this note might be different from the original. Followed by rheumatologist. Had to change providers due to an insurance change earlier this year resulting in patient not being able to have Remicade for ~6 months. [place]   Sero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 arthritis (HCC)    Slow transit constipation 08/01/2019   Formatting of this note might be different  from the original. Formatting of this note might be different from the original. Treated with PRN miralax. Recent colonoscopy 07/2019. Formatting of this note might be different from the original. Treated with PRN miralax. Recent colonoscopy 07/2019. Formatting of this note might be different from the original. Formatting of this note might be different f   Status post placement of ureteral stent 02/06/2021   Tooth infection 08/12/2020   Ureteral stone 01/20/2022   Urinary tract infection, site not specified 06/26/2022    Past Surgical History:  Procedure Laterality Date   BACK SURGERY     L5-S1   CERVICAL CONE BIOPSY  04/2000   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   EYE SURGERY     GASTRIC BYPASS  2017   Sleve   LAPAROSCOPIC GASTRIC SLEEVE RESECTION     Low back disc surgery     06/2000   TOTAL KNEE ARTHROPLASTY Right 11/2016    Current Medications: Current Meds  Medication Sig   acetaminophen (TYLENOL) 500 MG tablet Take 1,000 mg by mouth every 12 (twelve) hours as needed for mild pain or moderate pain.   albuterol (PROVENTIL) (2.5 MG/3ML) 0.083% nebulizer solution Take 3 mLs (2.5 mg total) by nebulization every 6 (six) hours as needed for wheezing or shortness of breath.   atorvastatin (LIPITOR) 20 MG tablet Take 1 tablet (20 mg total) by mouth daily.   B Complex-C (B-COMPLEX WITH VITAMIN C) tablet Take 1 tablet by mouth daily.   Cholecalciferol (VITAMIN D3) 50 MCG (2000 UT) TABS Take 1 tablet by mouth daily.   clopidogrel (PLAVIX) 75 MG tablet Take 1 tablet (75 mg total) by mouth daily.   EPINEPHrine (EPIPEN 2-PAK) 0.3 mg/0.3 mL IJ SOAJ injection Inject 0.3 mg into the muscle as needed for anaphylaxis.   esomeprazole (NEXIUM) 40 MG capsule Take 1 capsule (40 mg total) by mouth 2 (two) times daily before a meal.   famotidine (PEPCID) 40 MG tablet Take 1 tablet (40 mg total) by mouth daily.   Golimumab (SIMPONI ARIA IV) Inject 200 mg into the vein every 8 (eight) weeks.   isosorbide  mononitrate (IMDUR) 30 MG 24 hr tablet Take 1 tablet (30 mg total) by mouth daily.   linaclotide (LINZESS) 72 MCG capsule Take 72 mcg by mouth daily before breakfast.   Loratadine-Pseudoephedrine (CLARITIN-D 24 HOUR PO) Take 1 tablet by mouth daily.   mirabegron ER (MYRBETRIQ) 50 MG TB24 tablet Take 1 tablet (50 mg total) by mouth daily.   montelukast (SINGULAIR) 10 MG tablet Take 1 tablet (10 mg total) by mouth at bedtime.   nitroGLYCERIN (NITROSTAT) 0.4 MG SL tablet Place 1 tablet (0.4 mg total) under the tongue every 5 (five) minutes as needed for chest pain.   OXYGEN Inhale 2 L into the lungs at bedtime.   posaconazole (NOXAFIL) 100 MG TBEC delayed-release tablet Take 300 mg by mouth daily.   ZOLMitriptan (ZOMIG) 2.5 MG tablet Take 1 tablet (2.5 mg total) by mouth once for 1 dose. May repeat in 2 hours if headache persists or recurs.  Allergies:   Albuterol sulfate, Tape, Formoterol, Hydrocodone, Hydrocodone-acetaminophen, Nickel, Other, Oxycodone-acetaminophen, Salmeterol, Strawberry extract, Advair hfa [fluticasone-salmeterol], Asa [aspirin], Cleocin [clindamycin], Clindamycin/lincomycin, Fludrocortisone acetate, Gabapentin, Hydrochlorothiazide, Imipramine, Ipratropium bromide, Medroxyprogesterone, Meloxicam, Metformin and related, Nystatin, Penicillins, Pred forte [prednisolone], Prednisone, Proventil hfa [albuterol], Solu-medrol [methylprednisolone], Sulfa antibiotics, Tegaderm ag mesh [silver], Toradol [ketorolac tromethamine], and Tramadol   Social History   Socioeconomic History   Marital status: Married    Spouse name: Not on file   Number of children: Not on file   Years of education: Not on file   Highest education level: Not on file  Occupational History   Not on file  Tobacco Use   Smoking status: Never    Passive exposure: Never   Smokeless tobacco: Never  Vaping Use   Vaping status: Never Used  Substance and Sexual Activity   Alcohol use: Yes    Comment: rare    Drug use: Never   Sexual activity: Yes  Other Topics Concern   Not on file  Social History Narrative   Not on file   Social Determinants of Health   Financial Resource Strain: Medium Risk (10/01/2022)   Received from [facility]   Overall Financial Resource Strain (CARDIA)    Difficulty of Paying Living Expenses: Somewhat hard  Food Insecurity: No Food Insecurity (05/16/2023)   Received from [facility]   Hunger Vital Sign    Worried About Running Out of Food in the Last Year: Never true    Ran Out of Food in the Last Year: Never true  Transportation Needs: No Transportation Needs (05/17/2023)   Received from [facility] - Transportation    Lack of Transportation (Medical): No    Lack of Transportation (Non-Medical): No  Physical Activity: Unknown (10/01/2022)   Received from [facility]   Exercise Vital Sign    Days of Exercise per Week: 0 days    Minutes of Exercise per Session: Not on file  Stress: No Stress Concern Present (05/16/2023)   Received from [facility] - Occupational Stress Questionnaire    Feeling of Stress : Not at all  Social Connections: Somewhat Isolated (10/01/2022)   Received from [facility]   Social Network    How would you rate your social network (family, work, friends)?: Restricted participation with some degree of social isolation     Family History: The patient's family history includes Cancer in an other family member; Diabetes in her brother, father, mother, paternal grandmother, and sister; Hypertension in her brother, father, mother, and sister. There is no history of Asthma, Allergic rhinitis, Atopy, or Eczema. She was adopted. ROS:   Please see the history of present illness.    All 14 point review of systems negative except as described per history of present illness  EKGs/Labs/Other Studies Reviewed:         Recent  Labs: 11/30/2022: BUN 8; Creatinine, Ser 0.80; Hemoglobin 15.4; Platelets 342.0; Potassium 3.5; Sodium 140; TSH 0.52 06/01/2023: ALT 18  Recent Lipid Panel    Component Value Date/Time   CHOL 177 06/01/2023 0900   TRIG 145 06/01/2023 0900   HDL 58 06/01/2023 0900   CHOLHDL 3.1 06/01/2023 0900   LDLCALC 94 06/01/2023 0900    Physical Exam:    VS:  BP 132/74 (BP Location: Left Arm, Patient Position: Sitting)   Pulse 95   Ht 5\' 3"  (1.6 m)   Wt 222 lb 12.8  oz (101.1 kg)   SpO2 97%   BMI 39.47 kg/m     Wt Readings from Last 3 Encounters:  06/07/23 222 lb 12.8 oz (101.1 kg)  04/17/23 225 lb (102.1 kg)  03/23/23 227 lb 6.4 oz (103.1 kg)     GEN:  Well nourished, well developed in no acute distress HEENT: Normal NECK: No JVD; No carotid bruits LYMPHATICS: No lymphadenopathy CARDIAC: RRR, no murmurs, no rubs, no gallops RESPIRATORY:  Clear to auscultation without rales, wheezing or rhonchi  ABDOMEN: Soft, non-tender, non-distended MUSCULOSKELETAL:  No edema; No deformity  SKIN: Warm and dry LOWER EXTREMITIES: no swelling NEUROLOGIC:  Alert and oriented x 3 PSYCHIATRIC:  Normal affect   ASSESSMENT:    1. Esophageal candidiasis (HCC)   2. Coronary artery disease of native artery of native heart with stable angina pectoris (HCC)   3. Hypertension, essential, benign    PLAN:    In order of problems listed above:  Fatigue tiredness shortness of breath: Will schedule her to have echocardiogram to assess left ventricle ejection fraction.  She also reports that sometimes she checked her pulse of symmetry and when she is seeing numbers 55 heart rate she feels very weak she may require a monitor if echocardiogram to be unrevealing then monitor will be placed. Essential hypertension: Blood pressure well-controlled continue present management.  I did review K PN which show me her LDL 94 HDL 58.  This is on Lipitor 20.  Will consider increasing dose of medication to 40.   Medication  Adjustments/Labs and Tests Ordered: Current medicines are reviewed at length with the patient today.  Concerns regarding medicines are outlined above.  No orders of the defined types were placed in this encounter.  Medication changes: No orders of the defined types were placed in this encounter.   Signed, Georgeanna Lea, MD, [facility] 06/07/2023 3:13 PM    [facility]

## 2023-06-07 NOTE — Addendum Note (Signed)
Addended by: Baldo Ash D on: 06/07/2023 03:22 PM   Modules accepted: Orders

## 2023-06-10 NOTE — Telephone Encounter (Signed)
Per pre-op APP : Looks like she is pending an echo scheduled for 11/19   Will send update to requesting office that patient has echo on 11/19 and will review after echocardiogram has been completed.  Correct spelling of surgeons name : Dr. Gaetana Michaelis

## 2023-06-13 ENCOUNTER — Telehealth: Payer: Self-pay

## 2023-06-13 ENCOUNTER — Encounter: Payer: Self-pay | Admitting: Pulmonary Disease

## 2023-06-13 ENCOUNTER — Ambulatory Visit (INDEPENDENT_AMBULATORY_CARE_PROVIDER_SITE_OTHER): Payer: 59 | Admitting: Pulmonary Disease

## 2023-06-13 VITALS — BP 136/102 | HR 96 | Ht 63.0 in | Wt 224.6 lb

## 2023-06-13 DIAGNOSIS — E785 Hyperlipidemia, unspecified: Secondary | ICD-10-CM

## 2023-06-13 DIAGNOSIS — G4734 Idiopathic sleep related nonobstructive alveolar hypoventilation: Secondary | ICD-10-CM | POA: Diagnosis not present

## 2023-06-13 DIAGNOSIS — Z23 Encounter for immunization: Secondary | ICD-10-CM

## 2023-06-13 DIAGNOSIS — J4540 Moderate persistent asthma, uncomplicated: Secondary | ICD-10-CM | POA: Diagnosis not present

## 2023-06-13 MED ORDER — ATORVASTATIN CALCIUM 40 MG PO TABS
40.0000 mg | ORAL_TABLET | Freq: Every day | ORAL | 3 refills | Status: DC
Start: 2023-06-13 — End: 2023-07-02

## 2023-06-13 NOTE — Progress Notes (Signed)
@Patient  ID: Casey [name], female    DOB: June 24, 1971, 52 y.o.   MRN: [id number]  Chief Complaint  Patient presents with   Follow-up    52 y.o. woman whom we are seeing , wants to discuss having this r/s      Referring provider: Olive Bass,*  HPI:   52 y.o. woman whom we are seeing for evaluation of asthma.    Returns for routine follow-up.  Overall doing fairly well.  Asthma at bay for the time.  At baseline time.  Using nebulizer that very often.  Occasional albuterol use.  She has intolerance to most maintenance inhalers.  She has multiple other medical issues at play.  Recent hiatal hernia hernia surgery.  Note reviewed.  Multiple GI notes reviewed.  Hold off on pacifier because of this and possible candidiasis.  Getting infusions for this.  Also has orthopedic issues upcoming neck MRI.  Possible neck surgery.   HPI initial visit: Patient with long history of asthma.  Has been through multiple rounds of treatment.  Cannot tolerate albuterol HFA due to allergies to an active ingredient.  Has tried multiple different ICS/LABA therapies in the past cannot tolerate due to an active ingredients in [facility].  They does try DPI's, several, cannot tolerate due to allergies to inactive ingredients.  Has tried nebulized, long-acting nebulized therapies and cannot tolerate due to allergies to an active ingredients.  Prednisone in the past is also called worsening symptoms due to an active ingredients.  Basically, she has worsening symptoms, wheezing shortness of breath etc. with these traditional asthma treatments.  She cannot tolerate albuterol nebulized.  There is a period of time of the last few years where her asthma was well-controlled rare albuterol use.  However, she moved to the [place] over the last couple years.  The pollens of really bother her.  Both from a upper airway, nasal congestion rhinorrhea allergies as well as worsening asthma symptoms.  She does endorse refractory GERD as well.   She is on biologic therapy for rheumatoid arthritis.  She had chest x-ray 09/2022 that reveals clear lungs on my review and interpretation.  She had a CT chest 08/2022 reveals clear lungs bilaterally with signs of mosaicism on my review and interpretation.  She had a second CT chest 09/2022 that reveals clear lungs bilaterally with signs of mosaicism on my review and interpretation.  Discussed at length role and rationale for biologic therapy given her inability to tolerate traditional inhaled therapies and steroid therapies.  Discussed risk and benefits.  Discussed expected results.  After shared decision-making, agreed to move forward with evaluation for biologic therapy given her inability to tolerate inhaled therapies.   Questionaires / Pulmonary Flowsheets:   ACT:  Asthma Control Test ACT Total Score  12/20/2022 10:05 AM 11    MMRC:     No data to display          Epworth:      No data to display          Tests:   FENO:  No results found for: "NITRICOXIDE"  PFT:     No data to display          WALK:      No data to display          Imaging: Personally reviewed and as per EMR and discussion in this note No results found.  Lab Results: Personally reviewed CBC    Component Value Date/Time   WBC 9.6 11/30/2022 1000  RBC 5.20 (H) 11/30/2022 1000   HGB 15.4 (H) 11/30/2022 1000   HCT 44.7 11/30/2022 1000   PLT 342.0 11/30/2022 1000   MCV 86.0 11/30/2022 1000   MCH 28.8 10/05/2022 1415   MCHC 34.4 11/30/2022 1000   RDW 14.8 11/30/2022 1000   LYMPHSABS 2.9 11/30/2022 1000   MONOABS 0.7 11/30/2022 1000   EOSABS 0.1 11/30/2022 1000   BASOSABS 0.0 11/30/2022 1000    BMET    Component Value Date/Time   NA 140 11/30/2022 1000   NA 142 10/14/2022 1408   K 3.5 11/30/2022 1000   CL 101 11/30/2022 1000   CO2 31 11/30/2022 1000   GLUCOSE 102 (H) 11/30/2022 1000   BUN 8 11/30/2022 1000   BUN 14 10/14/2022 1408   CREATININE 0.80 11/30/2022 1000    CALCIUM 9.5 11/30/2022 1000   GFRNONAA >60 10/05/2022 1415    BNP No results found for: "BNP"  ProBNP No results found for: "PROBNP"  Specialty Problems       Pulmonary Problems   Asthma    Formatting of this note might be different from the original. Asthma Formatting of this note might be different from the original. Formatting of this note might be different from the original. Asthma Formatting of this note might be different from the original. Asthma Formatting of this note might be different from the original. Asthma Formatting of this note might be different from the original. Formatting of this note might be different from the original. Formatting of this note might be different from the original. Asthma Formatting of this note might be different from the original. Asthma Formatting of this note might be different from the original. Asthma Formatting of this note might be different from the original. Asthma      Mild intermittent asthma without complication    Formatting of this note might be different from the original. Formatting of this note might be different from the original. Formatting of this note might be different from the original. Asthma Formatting of this note might be different from the original. Asthma Formatting of this note might be different from the original. Formatting of this note might be different from the original. Asthma Formatting of this note might be different from the original. Asthma Formatting of this note might be different from the original. Formatting of this note might be different from the original. Asthma Formatting of this note might be different from the original. Asthma Formatting of this note might be different from the original. Formatting of this note might be different from the original. Formatting of this note might be different from the original. Formatting of this note might be different from the original. Asthma Formatting of this note might  be different from the original. Asthma Formatting of this note might be different from the original. Formatting of this note might be different from the original. Asthma Formatting of this note might be different from the original. Asthma      Hiatal hernia    Allergies  Allergen Reactions   Albuterol Sulfate Shortness Of Breath    Other reaction(s): Other (See Comments)  can't breathe  Other reaction(s): Other (See Comments) can't breathe   Tape     Other reaction(s): Other (See Comments), Other (See Comments), Other (See Comments)  severe skin breakdown  Cast and Bandage Cover  severe skin breakdown  severe skin breakdown   Formoterol Other (See Comments)    Other reaction(s): Headaches  Other reaction(s): Headaches  Other reaction(s): Headaches  Headaches  Other reaction(s): Headaches Other reaction(s): Headaches    Other reaction(s): Headaches    Headaches   Hydrocodone Rash   Hydrocodone-Acetaminophen Rash   Nickel Rash and Other (See Comments)   Other Rash    cast and bandage use  cast and bandage use  cast and bandage use  cast and bandage use    cast and bandage use cast and bandage use   Oxycodone-Acetaminophen Other (See Comments)    headache   Salmeterol Rash   Strawberry Extract Hives    Full body rash from strawberry   Advair Hfa [Fluticasone-Salmeterol]    Asa [Aspirin]    Cleocin [Clindamycin]    Clindamycin/Lincomycin    Fludrocortisone Acetate    Gabapentin    Hydrochlorothiazide    Imipramine    Ipratropium Bromide    Medroxyprogesterone    Meloxicam    Metformin And Related    Nystatin    Penicillins    Pred Forte [Prednisolone]    Prednisone    Proventil Hfa [Albuterol]    Solu-Medrol [Methylprednisolone]    Sulfa Antibiotics    Tegaderm Ag Mesh [Silver]    Toradol [Ketorolac Tromethamine]    Tramadol     Immunization History  Administered Date(s) Administered   Hep A, Unspecified 09/09/2011, 10/15/2011   Hepatitis B,  ADULT 09/09/2011, 10/15/2011   Influenza,inj,Quad PF,6+ Mos 04/19/2022   Influenza-Unspecified 05/03/2012   Moderna Covid-19 Fall Seasonal Vaccine 37yrs & older 06/07/2022   Pfizer Covid-19 Vaccine Bivalent Booster 22yrs & up 07/06/2021    Past Medical History:  Diagnosis Date   Albuminuria 06/27/2015   Anxiety 11/04/2014   Arm DVT (deep venous thromboembolism), acute, left (HCC) 08/17/2022   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/30/2014   Asthma    Atopic dermatitis 10/30/2008   Formatting of this note might be different from the original. Atopic dermatitis Formatting of this note might be different from the original. Formatting of this note might be different from the original. Atopic dermatitis Formatting of this note might be different from the original. Formatting of this note might be different from the original. Formatting of this note might be different from the or   Cataract 05/06/2015   Contusion of left wrist 07/08/2021   Crohn's disease (HCC) 04/21/2015   De Quervain's tenosynovitis, left 07/31/2021   Diabetes mellitus without complication (HCC)    Diabetes type 2, controlled (HCC) 04/21/2015   Dry eyes 05/06/2015   DVT (deep venous thrombosis) (HCC)    Dysfunction of both eustachian tubes 08/31/2012   Encounter for monitoring immunomodulating therapy 11/20/2021   Environmental allergies 08/12/2020   Esophageal candidiasis (HCC) 06/26/2022   Fibrositis 08/28/2014   Formatting of this note might be different from the original. Fibromyalgia Formatting of this note might be different from the original. Formatting of this note might be different from the original. Fibromyalgia   Flank pain 11/24/2020   Gastroesophageal reflux disease with esophagitis without hemorrhage 08/01/2019   Formatting of this note might be different from the original. Formatting of this note might be different from the original. 07/2019: chronic symptoms of esophageal reflux since  prior to sleeve gastrectomy surgery. Symptoms have worsened over the last 6 months with recent EGD findings of grade B esophagitis, irregular z line, erythematous mucosa, and evidence of sleeve gastrectomy. Biopsies with ch   Glaucoma suspect of both eyes 05/07/2016   Hematochezia 03/18/2016   Hematuria 06/18/2015   Hiatal hernia 10/07/2022   History of abnormal cervical Pap smear 11/29/2014  History of colon polyps 06/19/2019   Formatting of this note might be different from the original. Formatting of this note might be different from the original. 07/2019: 8 mm colonic polyp removed during colonoscopy, biopsy showed serrated polyp. Repeat colonoscopy recommended in one year. Formatting of this note might be different from the original. 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 Hypertension Formatting of this note might be different from the original. Formatting of this note might be different from the original. 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 Keratoconus Formatting of this note might be different from the original. Formatting of this note might be different from the original. Keratoconus Formatting of this note might be different from the original. Formatting of this note might be different from the original. Keratoconus Formatting of this note might be different from the or   Microhematuria 06/27/2015   Migraine 08/28/2014   Formatting of this note might be different from the original. Migraine Formatting of this  note might be different from the original. Formatting of this note might be different from the original. Migraine Formatting of this note might be different from the original. Migraine Formatting of this note might be different from the original. Migraine Formatting of this note might be different from the or   Migraine without aura and without status migrainosus, not intractable 08/28/2014   Formatting of this note might be different from the original. Formatting of this note might be different from the original. Formatting of this note might be different from the original. Migraine Formatting of this note might be different from the original. Migraine Formatting of this note might be different from the original. Formatting of this note might be different from the original. Migraine F   Mild intermittent asthma without complication 10/30/2008   Formatting of this note might be different from the original. Formatting of this note might be different from the original. Formatting of this note might be different from the original. Asthma Formatting of this note might be different from the original. Asthma Formatting of this note might be different from the original. Formatting of this note might be different from the original. 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 Formatting of this note might be different from the original. 01/25/2016 with Dr. Dionisio David at [facility] [place] of this note might be different from the original. 01/25/2016 with Dr. Dionisio David at [facility]. [place] of this note might be different  from the original. Formatting of this note might be different from    Seronegative inflammatory arthritis 08/28/2014   Formatting of this note might be different from the original. Formatting of this note might be different from the original. Arthritis; diagnosed as IBD related - 2012 Formatting of this note might be different from the original. Followed by rheumatologist. Had to change providers due to an insurance change earlier this year resulting in patient not being able to have Remicade for ~6 months. 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 Formatting of this note might be different from the original. Treated with PRN miralax. Recent colonoscopy 07/2019. Formatting of this note might be different from the original. Treated with PRN miralax. Recent colonoscopy 07/2019. Formatting of this note might be different from the original. Formatting of this note might be different f   Status post placement of ureteral stent 02/06/2021   Tooth infection 08/12/2020   Ureteral stone 01/20/2022   Urinary tract infection, site not specified 06/26/2022    Tobacco History: Social History   Tobacco Use  Smoking Status Never   Passive exposure: Never  Smokeless Tobacco Never   Counseling given: Not Answered   Continue to not smoke  Outpatient Encounter Medications as of 06/13/2023  Medication Sig   acetaminophen (TYLENOL) 500 MG tablet Take 1,000 mg by mouth every 12 (twelve) hours as needed for mild pain or moderate pain.   albuterol (PROVENTIL) (2.5 MG/3ML) 0.083% nebulizer solution Take 3 mLs  (2.5 mg total) by nebulization every 6 (six) hours as needed for wheezing or shortness of breath.   atorvastatin (LIPITOR) 40 MG tablet Take 1 tablet (40 mg total) by mouth daily. (Patient taking differently: Take 40 mg by mouth daily. Taking 2 tablets once a day)   B Complex-C (B-COMPLEX WITH VITAMIN C) tablet Take 1 tablet by mouth daily.   Cholecalciferol (VITAMIN D3) 50 MCG (2000 UT) TABS Take 1 tablet by mouth daily.   clopidogrel (PLAVIX) 75 MG tablet Take 1 tablet (75 mg total) by mouth daily.   EPINEPHrine (EPIPEN 2-PAK) 0.3 mg/0.3 mL IJ SOAJ injection Inject 0.3 mg into the muscle as needed for anaphylaxis.   esomeprazole (NEXIUM) 40 MG capsule Take 1 capsule (40 mg total) by mouth 2 (two) times daily before a meal.   famotidine (PEPCID) 40 MG tablet Take 1 tablet (40 mg total) by mouth daily.   Golimumab (SIMPONI ARIA IV) Inject 200 mg into the vein every 8 (eight) weeks.   levocetirizine (XYZAL) 5 MG tablet Take 5 mg by mouth every evening.   linaclotide (LINZESS) 72 MCG capsule Take 72 mcg by mouth daily before breakfast.   mirabegron ER (MYRBETRIQ) 50 MG TB24 tablet Take 1 tablet (50 mg total) by mouth daily.   montelukast (SINGULAIR) 10 MG tablet Take 1 tablet (10 mg total) by mouth at bedtime.   nitroGLYCERIN (NITROSTAT) 0.4 MG SL tablet Place 1 tablet (0.4 mg total) under the tongue every 5 (five) minutes as needed for chest pain.   OXYGEN Inhale 2 L into the lungs at bedtime.   ZOLMitriptan (ZOMIG) 2.5 MG tablet Take 1 tablet (2.5 mg total) by mouth once for 1 dose. May repeat in 2 hours if headache persists or recurs.   [DISCONTINUED] isosorbide mononitrate (IMDUR) 30 MG 24 hr tablet Take 1 tablet (30 mg total) by mouth daily.   [DISCONTINUED] Loratadine-Pseudoephedrine (CLARITIN-D 24 HOUR  PO) Take 1 tablet by mouth daily.   [DISCONTINUED] posaconazole (NOXAFIL) 100 MG TBEC delayed-release tablet Take 300 mg by mouth daily.   No facility-administered encounter medications on  file as of 06/13/2023.     Review of Systems  Review of Systems  N/a Physical Exam  BP (!) 136/102   Pulse 96   Ht 5\' 3"  (1.6 m)   Wt 224 lb 9.6 oz (101.9 kg)   SpO2 98% Comment: ra  BMI 39.79 kg/m   Wt Readings from Last 5 Encounters:  06/13/23 224 lb 9.6 oz (101.9 kg)  06/07/23 222 lb 12.8 oz (101.1 kg)  04/17/23 225 lb (102.1 kg)  03/23/23 227 lb 6.4 oz (103.1 kg)  02/01/23 225 lb (102.1 kg)    BMI Readings from Last 5 Encounters:  06/13/23 39.79 kg/m  06/07/23 39.47 kg/m  04/17/23 39.86 kg/m  03/23/23 40.28 kg/m  02/01/23 39.86 kg/m     Physical Exam General: Sitting in chair, no acute distress Eyes: EOMI, no icterus Neck: Supple, no JVP Neck: Clear, normal work of breathing Cardiovascular: Warm, no edema Abdomen: Nondistended, bowel sounds present MSK: No synovitis, no joint effusion Neuro: Normal gait, no weakness Psych: Normal mood, full affect   Assessment & Plan:   Severe persistent asthma: Due to multiple allergies due and active ingredients, only current treatment available is albuterol nebulizer.  Allergic to an active ingredient in the prednisone as well.  Prior inhalers, HFA, DPI, other long-acting nebulizers have caused worsening symptoms.  As such, suspect she will require a trial of biologic therapy to see if we can make her symptoms better, if these do not yield results, not much more we can offer.  Eosinophils not significant elevated, as high as 200 so to consider IL-5 therapy.  No IgE level, will obtain today.  Recommend Tezspire given mild eosinophil elevation.  IL-5 therapy would be reasonable if fails.  Risk factors uncontrolled asthma include hiatal hernia and refractory GERD, uncontrolled seasonal allergy symptoms.  She has multiple medical issues that preclude starting at this time.  Encouraged to contact us when she is ready.  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  Sleep test in interim demonstrated AHI 3.4, no sleep apnea.  Did  demonstrate nocturnal hypoxemia.  She is intermittently using her oxygen at night.  Loud noises for machine make it hard to sleep.   Return in about 6 months (around 12/11/2023) for f/u Dr. Judeth Horn.   Karren Burly, MD 06/13/2023

## 2023-06-13 NOTE — Patient Instructions (Addendum)
Nice to see you again  No changes to medicines  Contact me when things are more settled and we can see about the Tezspire injection  Return to clinic in 6 months or sooner as needed

## 2023-06-13 NOTE — Telephone Encounter (Signed)
Spoke with pt regarding Cholesterol blood work. She will increase Lipitor to 40mg  daily and come for blood work in 6 weeks. Routed to PCP.

## 2023-06-13 NOTE — Addendum Note (Signed)
Addended by: Winn Jock on: 06/13/2023 02:15 PM   Modules accepted: Orders

## 2023-06-23 ENCOUNTER — Telehealth: Payer: Self-pay | Admitting: Cardiology

## 2023-06-23 NOTE — Telephone Encounter (Signed)
Patient is calling to talk with Dr. Bing Matter or nurse

## 2023-06-23 NOTE — Telephone Encounter (Signed)
Spoke with patient and she states her heart has been racing  and she has been getting dizzy and SOB. When she checked her HR it was 110 this morning. Currently asymptomatic. HR currently 92. Advised to continue monitor HR. ED precautions discussed will forward to provider.

## 2023-06-24 ENCOUNTER — Ambulatory Visit (HOSPITAL_COMMUNITY)
Admission: RE | Admit: 2023-06-24 | Discharge: 2023-06-24 | Disposition: A | Discharge status: Home / Self Care | Payer: 59 | Source: Ambulatory Visit | Attending: Family | Admitting: Family

## 2023-06-24 DIAGNOSIS — R0609 Other forms of dyspnea: Secondary | ICD-10-CM | POA: Diagnosis present

## 2023-06-24 LAB — ECHOCARDIOGRAM COMPLETE
AR max vel: 2.56 cm2
AV Area VTI: 2.8 cm2
AV Area mean vel: 2.45 cm2
AV Mean grad: 4 mm[Hg]
AV Peak grad: 6.2 mm[Hg]
Ao pk vel: 1.24 m/s
Area-P 1/2: 3.02 cm2
Calc EF: 53.3 %
MV VTI: 2.47 cm2
S' Lateral: 2.6 cm
Single Plane A2C EF: 35.4 %
Single Plane A4C EF: 64.6 %

## 2023-06-27 ENCOUNTER — Telehealth: Payer: Self-pay | Admitting: Cardiology

## 2023-06-27 NOTE — Telephone Encounter (Signed)
Patient is calling requesting call back in regards to update on echo results. Please advise.

## 2023-06-27 NOTE — Telephone Encounter (Signed)
Advised that Dr. Bing Matter has not reviewed at this time. Pt verbalized understanding and had no additional questions.

## 2023-06-28 ENCOUNTER — Ambulatory Visit: Payer: 59

## 2023-06-28 ENCOUNTER — Other Ambulatory Visit: Payer: 59

## 2023-06-28 ENCOUNTER — Telehealth: Payer: Self-pay | Admitting: Cardiology

## 2023-06-28 DIAGNOSIS — R079 Chest pain, unspecified: Secondary | ICD-10-CM

## 2023-06-28 LAB — TROPONIN T: Troponin T (Highly Sensitive): 7 ng/L (ref 0–14)

## 2023-06-28 NOTE — Telephone Encounter (Signed)
   Name: TAMIIA LEBECK  DOB: [birth year]  MRN: [id number]  Primary Cardiologist: Gypsy Balsam, MD  Chart reviewed as part of pre-operative protocol coverage. The patient has an upcoming visit scheduled with Dr. Wallis Bamberg, NP on 07/04/23 at which time clearance can be addressed in case there are any issues that would impact surgical recommendations.   I added preop FYI to appointment note so that provider is aware to address at time of outpatient visit.  Per office protocol the cardiology provider should forward their finalized clearance decision and recommendations regarding antiplatelet therapy to the requesting party below.     I will route this message as FYI to requesting party and remove this message from the preop box as separate preop APP input not needed at this time.   Please call with any questions.  Napoleon Form, Leodis Rains, NP  06/28/2023, 7:59 AM

## 2023-06-28 NOTE — Telephone Encounter (Signed)
Spoke with Dr. Bing Matter. He recommended that she come by for a Troponin I. Pt stated that she would come by today or in the morning.

## 2023-06-28 NOTE — Telephone Encounter (Signed)
STAT if HR is under 50 or over 120 (normal HR is 60-100 beats per minute)  What is your heart rate? 75   Do you have a log of your heart rate readings (document readings)? 119,105   Do you have any other symptoms? BP has  been 150/101, 148/92 Pt has started to have chest discomfort this morning. She feels like she is getting worse. She would like a c/b from a nurse.

## 2023-06-28 NOTE — Progress Notes (Unsigned)
   Nurse Visit   Date of Encounter: 06/28/2023 ID: [name], DOB [birth year], MRN [id number]  PCP:  Olive Bass, FNP   [facility] Providers Cardiologist:  Gypsy Balsam, MD { Click to update primary MD,subspecialty MD or APP then REFRESH:1}     Visit Details   VS:  BP 130/88 (BP Location: Left Arm, Patient Position: Sitting, Cuff Size: Normal)  , BMI There is no height or weight on file to calculate BMI.  Wt Readings from Last 3 Encounters:  06/13/23 224 lb 9.6 oz (101.9 kg)  06/07/23 222 lb 12.8 oz (101.1 kg)  04/17/23 225 lb (102.1 kg)     Reason for visit: Perform blood draw and the patient requested a blood pressure check. Performed today: Vitals, Education and Blood Draw Changes (medications, testing, etc.) : No new orders at this time Length of Visit: 20 minutes    Medications Adjustments/Labs and Tests Ordered: No orders of the defined types were placed in this encounter.  No orders of the defined types were placed in this encounter.    Signed, Samson Frederic, RN  06/28/2023 4:34 PM

## 2023-06-28 NOTE — Telephone Encounter (Signed)
Spoke with pt, she reported that her pulse rate has been up this morning, she has had stabbing chest pain and some nausea and actually threw up. She took one nitroglycerin and the pain subsided. She is awaiting her Echo report as well. Will discuss with Dr. Bing Matter and call pt with his reply.

## 2023-06-29 ENCOUNTER — Emergency Department (HOSPITAL_COMMUNITY): Payer: 59

## 2023-06-29 ENCOUNTER — Observation Stay (HOSPITAL_COMMUNITY)
Admission: EM | Admit: 2023-06-29 | Discharge: 2023-07-02 | Disposition: A | Discharge status: Home / Self Care | Payer: 59 | Attending: Internal Medicine | Admitting: Internal Medicine

## 2023-06-29 ENCOUNTER — Encounter (HOSPITAL_COMMUNITY): Payer: Self-pay

## 2023-06-29 ENCOUNTER — Other Ambulatory Visit: Payer: Self-pay

## 2023-06-29 DIAGNOSIS — I25110 Atherosclerotic heart disease of native coronary artery with unstable angina pectoris: Principal | ICD-10-CM | POA: Insufficient documentation

## 2023-06-29 DIAGNOSIS — R079 Chest pain, unspecified: Principal | ICD-10-CM | POA: Diagnosis present

## 2023-06-29 DIAGNOSIS — Z886 Allergy status to analgesic agent status: Secondary | ICD-10-CM | POA: Insufficient documentation

## 2023-06-29 DIAGNOSIS — I10 Essential (primary) hypertension: Secondary | ICD-10-CM | POA: Diagnosis present

## 2023-06-29 DIAGNOSIS — Z86718 Personal history of other venous thrombosis and embolism: Secondary | ICD-10-CM

## 2023-06-29 DIAGNOSIS — K449 Diaphragmatic hernia without obstruction or gangrene: Secondary | ICD-10-CM | POA: Insufficient documentation

## 2023-06-29 DIAGNOSIS — E119 Type 2 diabetes mellitus without complications: Secondary | ICD-10-CM | POA: Diagnosis not present

## 2023-06-29 DIAGNOSIS — K5090 Crohn's disease, unspecified, without complications: Secondary | ICD-10-CM | POA: Insufficient documentation

## 2023-06-29 DIAGNOSIS — D84821 Immunodeficiency due to drugs: Secondary | ICD-10-CM

## 2023-06-29 DIAGNOSIS — E7800 Pure hypercholesterolemia, unspecified: Secondary | ICD-10-CM | POA: Diagnosis present

## 2023-06-29 DIAGNOSIS — M069 Rheumatoid arthritis, unspecified: Secondary | ICD-10-CM | POA: Diagnosis not present

## 2023-06-29 DIAGNOSIS — J4550 Severe persistent asthma, uncomplicated: Secondary | ICD-10-CM | POA: Diagnosis not present

## 2023-06-29 DIAGNOSIS — M0609 Rheumatoid arthritis without rheumatoid factor, multiple sites: Secondary | ICD-10-CM | POA: Diagnosis present

## 2023-06-29 DIAGNOSIS — E785 Hyperlipidemia, unspecified: Secondary | ICD-10-CM | POA: Diagnosis present

## 2023-06-29 DIAGNOSIS — Z955 Presence of coronary angioplasty implant and graft: Secondary | ICD-10-CM | POA: Diagnosis not present

## 2023-06-29 DIAGNOSIS — Z79899 Other long term (current) drug therapy: Secondary | ICD-10-CM | POA: Diagnosis not present

## 2023-06-29 DIAGNOSIS — Z7902 Long term (current) use of antithrombotics/antiplatelets: Secondary | ICD-10-CM | POA: Diagnosis not present

## 2023-06-29 DIAGNOSIS — G8929 Other chronic pain: Secondary | ICD-10-CM | POA: Insufficient documentation

## 2023-06-29 DIAGNOSIS — Z9884 Bariatric surgery status: Secondary | ICD-10-CM | POA: Insufficient documentation

## 2023-06-29 LAB — URINALYSIS, ROUTINE W REFLEX MICROSCOPIC
Bilirubin Urine: NEGATIVE
Glucose, UA: NEGATIVE mg/dL
Hgb urine dipstick: NEGATIVE
Ketones, ur: NEGATIVE mg/dL
Leukocytes,Ua: NEGATIVE
Nitrite: NEGATIVE
Protein, ur: 30 mg/dL — AB
Specific Gravity, Urine: 1.025 (ref 1.005–1.030)
pH: 5 (ref 5.0–8.0)

## 2023-06-29 LAB — D-DIMER, QUANTITATIVE: D-Dimer, Quant: 0.44 ug{FEU}/mL (ref 0.00–0.50)

## 2023-06-29 LAB — CBC
HCT: 45.5 % (ref 36.0–46.0)
Hemoglobin: 14 g/dL (ref 12.0–15.0)
MCH: 25.4 pg — ABNORMAL LOW (ref 26.0–34.0)
MCHC: 30.8 g/dL (ref 30.0–36.0)
MCV: 82.4 fL (ref 80.0–100.0)
Platelets: 417 10*3/uL — ABNORMAL HIGH (ref 150–400)
RBC: 5.52 MIL/uL — ABNORMAL HIGH (ref 3.87–5.11)
RDW: 16.2 % — ABNORMAL HIGH (ref 11.5–15.5)
WBC: 21.3 10*3/uL — ABNORMAL HIGH (ref 4.0–10.5)
nRBC: 0 % (ref 0.0–0.2)

## 2023-06-29 LAB — COMPREHENSIVE METABOLIC PANEL
ALT: 21 U/L (ref 0–44)
AST: 26 U/L (ref 15–41)
Albumin: 3.7 g/dL (ref 3.5–5.0)
Alkaline Phosphatase: 86 U/L (ref 38–126)
Anion gap: 11 (ref 5–15)
BUN: 10 mg/dL (ref 6–20)
CO2: 23 mmol/L (ref 22–32)
Calcium: 9.8 mg/dL (ref 8.9–10.3)
Chloride: 105 mmol/L (ref 98–111)
Creatinine, Ser: 0.85 mg/dL (ref 0.44–1.00)
GFR, Estimated: >60 mL/min (ref >60)
Glucose, Bld: 127 mg/dL — ABNORMAL HIGH (ref 70–99)
Potassium: 4.6 mmol/L (ref 3.5–5.1)
Sodium: 139 mmol/L (ref 135–145)
Total Bilirubin: 0.6 mg/dL (ref <1.2)
Total Protein: 8.6 g/dL — ABNORMAL HIGH (ref 6.5–8.1)

## 2023-06-29 LAB — HIV ANTIBODY (ROUTINE TESTING W REFLEX): HIV Screen 4th Generation wRfx: NONREACTIVE

## 2023-06-29 LAB — TROPONIN I (HIGH SENSITIVITY)
Troponin I (High Sensitivity): 6 ng/L (ref <18)
Troponin I (High Sensitivity): 6 ng/L (ref <18)

## 2023-06-29 LAB — CBG MONITORING, ED: Glucose-Capillary: 113 mg/dL — ABNORMAL HIGH (ref 70–99)

## 2023-06-29 LAB — HM HIV SCREENING LAB: HM HIV Screening: NEGATIVE

## 2023-06-29 MED ORDER — ENOXAPARIN SODIUM 40 MG/0.4ML IJ SOSY
40.0000 mg | PREFILLED_SYRINGE | INTRAMUSCULAR | Status: DC
  Filled 2023-06-29 (×2): qty 0.4

## 2023-06-29 MED ORDER — METOCLOPRAMIDE HCL 5 MG/ML IJ SOLN
10.0000 mg | Freq: Once | INTRAMUSCULAR | Status: AC
Start: 2023-06-29 — End: 2023-06-29
  Administered 2023-06-29: 10 mg via INTRAVENOUS
  Filled 2023-06-29: qty 2

## 2023-06-29 MED ORDER — NITROGLYCERIN 0.4 MG SL SUBL
0.4000 mg | SUBLINGUAL_TABLET | SUBLINGUAL | Status: DC | PRN
Start: 2023-06-29 — End: 2023-07-02
  Administered 2023-06-29: 0.4 mg via SUBLINGUAL
  Filled 2023-06-29: qty 1

## 2023-06-29 MED ORDER — ACETAMINOPHEN 325 MG PO TABS
650.0000 mg | ORAL_TABLET | ORAL | Status: DC | PRN
  Administered 2023-06-30 – 2023-07-02 (×3): 650 mg via ORAL
  Filled 2023-06-29 (×4): qty 2

## 2023-06-29 MED ORDER — ONDANSETRON HCL 4 MG/2ML IJ SOLN: 4.0000 mg | Freq: Four times a day (QID) | INTRAMUSCULAR | Status: DC | PRN

## 2023-06-29 NOTE — Assessment & Plan Note (Addendum)
Trop neg x2 Improved with NTG though. See HPI for EDP d/w cards. CP obs pathway Tele monitor NPO after MN Message sent to P. Trent for cards eval. DDx includes PNA or PE: WBC 21k BCx ordered UA unimpressive Pleuritic CP worse with deep inspiration D.Dimer neg today, however, pt with positive h/o DVT/PE previously, no longer on eliquis. Getting CTA chest to: R/O PE Evaluate for PNA not showing up on CXR

## 2023-06-29 NOTE — Assessment & Plan Note (Signed)
Med rec pending Looks like she's on biologic though (golimumab Q8 weeks)

## 2023-06-29 NOTE — ED Provider Triage Note (Signed)
Emergency Medicine Provider Triage Evaluation Note  Casey [name] , a 52 y.o. female  was evaluated in triage.  Pt complains of chest pain, HTN.  Review of Systems  Positive:  Negative:   Physical Exam  BP (!) 139/124 (BP Location: Right Arm)   Pulse (!) 106   Temp 98.2 F (36.8 C) (Oral)   Resp 18   SpO2 99%  Gen:   Awake, no distress   Resp:  Normal effort  MSK:   Moves extremities without difficulty  Other:    Medical Decision Making  Medically screening exam initiated at 2:09 PM.  Appropriate orders placed.  Casey [name] was informed that the remainder of the evaluation will be completed by another provider, this initial triage assessment does not replace that evaluation, and the importance of remaining in the ED until their evaluation is complete.  Chest pain, headache, HTN (150's/100's), blurry vision currently. Stating that she has been having chest pain intermittently since yesterday which mostly resolved with NTG. Patient cardiologist referred her to ED. Patient with large autoimmune hx. Neuro exam currently unremarkable.   Casey [name], [place] 06/29/23 [phone]

## 2023-06-29 NOTE — ED Triage Notes (Signed)
Pt reports elevated bp since Monday. Yesterday, pt started having chest pain. Seen her cardiologist yesterday with labs done but no results. Today, chest pain worsened whiled at [facility] with dizziness and blurred vision. Alert and oriented x 4. Pt took 1 nitroglycerin prior to calling 911.  EMS VS: Bp 150/118 HR 78 96% on RA

## 2023-06-29 NOTE — ED Notes (Signed)
ED TO INPATIENT HANDOFF REPORT  ED Nurse Name and Phone #: [phone] Artice Holohan   S Name/Age/Gender Casey [name] 51 y.o. female Room/Bed: RESUSC/RESUSC  Code Status   Code Status: Full Code  Home/SNF/Other Home Patient oriented to: self, place, time, and situation Is this baseline? Yes   Triage Complete: Triage complete  Chief Complaint Chest pain, rule out acute myocardial infarction [R07.9]  Triage Note Pt reports elevated bp since Monday. Yesterday, pt started having chest pain. Seen her cardiologist yesterday with labs done but no results. Today, chest pain worsened whiled at [facility] with dizziness and blurred vision. Alert and oriented x 4. Pt took 1 nitroglycerin prior to calling 911.  EMS VS: Bp 150/118 HR 78 96% on RA   Allergies Allergies  Allergen Reactions   Albuterol Sulfate Shortness Of Breath    Other reaction(s): Other (See Comments)  can't breathe  Other reaction(s): Other (See Comments) can't breathe   Tape     Other reaction(s): Other (See Comments), Other (See Comments), Other (See Comments)  severe skin breakdown  Cast and Bandage Cover  severe skin breakdown  severe skin breakdown   Formoterol Other (See Comments)    Other reaction(s): Headaches  Other reaction(s): Headaches  Other reaction(s): Headaches  Headaches  Other reaction(s): Headaches Other reaction(s): Headaches    Other reaction(s): Headaches    Headaches   Hydrocodone Rash   Hydrocodone-Acetaminophen Rash   Nickel Rash and Other (See Comments)   Other Rash    cast and bandage use  cast and bandage use  cast and bandage use  cast and bandage use    cast and bandage use cast and bandage use   Oxycodone-Acetaminophen Other (See Comments)    headache   Salmeterol Rash   Strawberry Extract Hives    Full body rash from strawberry   Advair Hfa [Fluticasone-Salmeterol]    Asa [Aspirin]    Cleocin [Clindamycin]    Clindamycin/Lincomycin    Fludrocortisone Acetate     Gabapentin    Hydrochlorothiazide    Imipramine    Ipratropium Bromide    Medroxyprogesterone    Meloxicam    Metformin And Related    Nystatin    Penicillins    Pred Forte [Prednisolone]    Prednisone    Proventil Hfa [Albuterol]    Solu-Medrol [Methylprednisolone]    Sulfa Antibiotics    Tegaderm Ag Mesh [Silver]    Toradol [Ketorolac Tromethamine]    Tramadol     Level of Care/Admitting Diagnosis ED Disposition     ED Disposition  Admit   Condition  --   Comment  Hospital Area: [facility] [[id number]]  Level of Care: Telemetry Cardiac [[id number]]  May place patient in observation at [facility] or [facility] if equivalent level of care is available:: No  Covid Evaluation: Asymptomatic - no recent exposure (last 10 days) testing not required  Diagnosis: Chest pain, rule out acute myocardial infarction [[id number]]  Admitting Physician: Hillary Bow [[id number]  Attending Physician: Hillary Bow [[id number]]          B Medical/Surgery History Past Medical History:  Diagnosis Date   Albuminuria 06/27/2015   Anxiety 11/04/2014   Arm DVT (deep venous thromboembolism), acute, left (HCC) 08/17/2022   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/30/2014   Asthma    Atopic dermatitis 10/30/2008   Formatting of this note might be different from the original. Atopic dermatitis Formatting of this note might be different from the original. Formatting  of this note might be different from the original. Atopic dermatitis Formatting of this note might be different from the original. Formatting of this note might be different from the original. Formatting of this note might be different from the or   Cataract 05/06/2015   Contusion of left wrist 07/08/2021   Crohn's disease (HCC) 04/21/2015   De Quervain's tenosynovitis, left 07/31/2021   Diabetes mellitus without complication (HCC)    Diabetes type 2, controlled (HCC) 04/21/2015   Dry eyes 05/06/2015   DVT  (deep venous thrombosis) (HCC)    Dysfunction of both eustachian tubes 08/31/2012   Encounter for monitoring immunomodulating therapy 11/20/2021   Environmental allergies 08/12/2020   Esophageal candidiasis (HCC) 06/26/2022   Fibrositis 08/28/2014   Formatting of this note might be different from the original. Fibromyalgia Formatting of this note might be different from the original. Formatting of this note might be different from the original. Fibromyalgia   Flank pain 11/24/2020   Gastroesophageal reflux disease with esophagitis without hemorrhage 08/01/2019   Formatting of this note might be different from the original. Formatting of this note might be different from the original. 07/2019: chronic symptoms of esophageal reflux since prior to sleeve gastrectomy surgery. Symptoms have worsened over the last 6 months with recent EGD findings of grade B esophagitis, irregular z line, erythematous mucosa, and evidence of sleeve gastrectomy. 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 Formatting of this note might be different from the original. 07/2019: 8 mm colonic polyp removed during colonoscopy, biopsy showed serrated polyp. Repeat colonoscopy recommended in one year. Formatting of this note might be different from the original. 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 Hypertension Formatting of this note might be different from the original. Formatting  of this note might be different from the original. 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 Keratoconus Formatting of this note might be different from the original. Formatting of this note might be different from the original. Keratoconus Formatting of this note might be different from the original. Formatting of this note might be different from the original. Keratoconus Formatting of this note might be different from the or   Microhematuria 06/27/2015   Migraine 08/28/2014   Formatting of this note might be different from the original. Migraine Formatting of this note might be different from the original. Formatting of this note might be different from the original. Migraine Formatting of this note might be different from the original. Migraine Formatting of this note might be different from the original. Migraine Formatting of this note might be different from the or   Migraine without aura and without status migrainosus, not intractable 08/28/2014   Formatting of this note might be different from the original. Formatting of this note might be different from the original. Formatting of this  note might be different from the original. Migraine Formatting of this note might be different from the original. Migraine Formatting of this note might be different from the original. Formatting of this note might be different from the original. Migraine F   Mild intermittent asthma without complication 10/30/2008   Formatting of this note might be different from the original. Formatting of this note might be different from the original. Formatting of this note might be different from the original. Asthma Formatting of this note might be different from the original. Asthma Formatting of this note might be different from the original.  Formatting of this note might be different from the original. 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 Formatting of this note might be different from the original. 01/25/2016 with Dr. Dionisio David at [facility]. [place] of this note might be different from the original. 01/25/2016 with Dr. Dionisio David at [facility]. [employer] of this note might be different from the original. Formatting of this note might be different from    Seronegative inflammatory arthritis 08/28/2014   Formatting of this note might be different from the original. Formatting of this note might be different from the original. Arthritis; diagnosed as IBD related - 2012 Formatting of this note might be different from the original. Followed by rheumatologist. Had to change providers due to an insurance change earlier this year resulting in patient not being able to have Remicade for ~6 months. 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 Formatting of this note might be different from the original. Treated with PRN miralax. Recent colonoscopy 07/2019. Formatting of this note might be different from the original. Treated with PRN miralax. Recent colonoscopy  07/2019. Formatting of this note might be different from the original. Formatting of this note might be different f   Status post placement of ureteral stent 02/06/2021   Tooth infection 08/12/2020   Ureteral stone 01/20/2022   Urinary tract infection, site not specified 06/26/2022   Past Surgical History:  Procedure Laterality Date   BACK SURGERY     L5-S1   CERVICAL CONE BIOPSY  04/2000   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   EYE SURGERY     GASTRIC BYPASS  2017   Sleve   LAPAROSCOPIC GASTRIC SLEEVE RESECTION     Low back disc surgery     06/2000   TOTAL KNEE ARTHROPLASTY Right 11/2016     A IV Location/Drains/Wounds Patient Lines/Drains/Airways Status     Active Line/Drains/Airways     Name Placement date Placement time Site Days   Peripheral IV 10/05/22 20 G Anterior;Left;Proximal Forearm 10/05/22  2006  Forearm  267   Peripheral IV 06/29/23 20 G 1" Right Antecubital 06/29/23  1545  Antecubital  less than 1  Intake/Output Last 24 hours No intake or output data in the 24 hours ending 06/29/23 2029  Labs/Imaging Results for orders placed or performed during the hospital encounter of 06/29/23 (from the past 48 hour(s))  CBC     Status: Abnormal   Collection Time: 06/29/23  1:42 PM  Result Value Ref Range   WBC 21.3 (H) 4.0 - 10.5 K/uL   RBC 5.52 (H) 3.87 - 5.11 MIL/uL   Hemoglobin 14.0 12.0 - 15.0 g/dL   HCT 16.1 09.6 - 04.5 %   MCV 82.4 80.0 - 100.0 fL   MCH 25.4 (L) 26.0 - 34.0 pg   MCHC 30.8 30.0 - 36.0 g/dL   RDW 40.9 (H) 81.1 - 91.4 %   Platelets 417 (H) 150 - 400 K/uL   nRBC 0.0 0.0 - 0.2 %    Comment: Performed at [facility], [address], [place], [place] [address]  Troponin I (High Sensitivity)     Status: None   Collection Time: 06/29/23  1:42 PM  Result Value Ref Range   Troponin I (High Sensitivity) 6 <18 ng/L    Comment: (NOTE) Elevated high sensitivity troponin I (hsTnI) values and significant  changes across serial  measurements may suggest ACS but many other  chronic and acute conditions are known to elevate hsTnI results.  Refer to the "Links" section for chest pain algorithms and additional  guidance. Performed at [facility], [address], [place], [place] [address]   Comprehensive metabolic panel     Status: Abnormal   Collection Time: 06/29/23  1:42 PM  Result Value Ref Range   Sodium 139 135 - 145 mmol/L   Potassium 4.6 3.5 - 5.1 mmol/L   Chloride 105 98 - 111 mmol/L   CO2 23 22 - 32 mmol/L   Glucose, Bld 127 (H) 70 - 99 mg/dL    Comment: Glucose reference range applies only to samples taken after fasting for at least 8 hours.   BUN 10 6 - 20 mg/dL   Creatinine, Ser 8.65 0.44 - 1.00 mg/dL   Calcium 9.8 8.9 - 78.4 mg/dL   Total Protein 8.6 (H) 6.5 - 8.1 g/dL   Albumin 3.7 3.5 - 5.0 g/dL   AST 26 15 - 41 U/L   ALT 21 0 - 44 U/L   Alkaline Phosphatase 86 38 - 126 U/L   Total Bilirubin 0.6 <1.2 mg/dL   GFR, Estimated >69 >62 mL/min    Comment: (NOTE) Calculated using the CKD-EPI Creatinine Equation (2021)    Anion gap 11 5 - 15    Comment: Performed at [facility], [address], [place], [place] [address]  POC CBG, ED     Status: Abnormal   Collection Time: 06/29/23  3:37 PM  Result Value Ref Range   Glucose-Capillary 113 (H) 70 - 99 mg/dL    Comment: Glucose reference range applies only to samples taken after fasting for at least 8 hours.  Troponin I (High Sensitivity)     Status: None   Collection Time: 06/29/23  3:39 PM  Result Value Ref Range   Troponin I (High Sensitivity) 6 <18 ng/L    Comment: (NOTE) Elevated high sensitivity troponin I (hsTnI) values and significant  changes across serial measurements may suggest ACS but many other  chronic and acute conditions are known to elevate hsTnI results.  Refer to the "Links" section for chest pain algorithms and additional  guidance. Performed at [facility],  [address] [place], [place] [address]    D-dimer, quantitative     Status: None   Collection Time: 06/29/23  3:39 PM  Result Value Ref Range   D-Dimer, Quant 0.44 0.00 - 0.50 ug/mL-FEU    Comment: (NOTE) At the manufacturer cut-off value of 0.5 g/mL FEU, this assay has a negative predictive value of 95-100%.This assay is intended for use in conjunction with a clinical pretest probability (PTP) assessment model to exclude pulmonary embolism (PE) and deep venous thrombosis (DVT) in outpatients suspected of PE or DVT. Results should be correlated with clinical presentation. Performed at [facility], [address], [place], [place] [address]   Urinalysis, Routine w reflex microscopic -Urine, Clean Catch     Status: Abnormal   Collection Time: 06/29/23  5:23 PM  Result Value Ref Range   Color, Urine YELLOW YELLOW   APPearance CLOUDY (A) CLEAR   Specific Gravity, Urine 1.025 1.005 - 1.030   pH 5.0 5.0 - 8.0   Glucose, UA NEGATIVE NEGATIVE mg/dL   Hgb urine dipstick NEGATIVE NEGATIVE   Bilirubin Urine NEGATIVE NEGATIVE   Ketones, ur NEGATIVE NEGATIVE mg/dL   Protein, ur 30 (A) NEGATIVE mg/dL   Nitrite NEGATIVE NEGATIVE   Leukocytes,Ua NEGATIVE NEGATIVE   RBC / HPF 0-5 0 - 5 RBC/hpf   WBC, UA 0-5 0 - 5 WBC/hpf   Bacteria, UA RARE (A) NONE SEEN   Squamous Epithelial / HPF 0-5 0 - 5 /HPF   Mucus PRESENT    Ca Oxalate Crys, UA PRESENT     Comment: Performed at [facility], [address] [place], [place] [address]   CT Head Wo Contrast  Result Date: 06/29/2023 CLINICAL DATA:  Altered level of consciousness, hypertension EXAM: CT HEAD WITHOUT CONTRAST TECHNIQUE: Contiguous axial images were obtained from the base of the skull through the vertex without intravenous contrast. RADIATION DOSE REDUCTION: This exam was performed according to the departmental dose-optimization program which includes automated exposure control, adjustment of the mA and/or kV according to patient size and/or use of iterative  reconstruction technique. COMPARISON:  08/12/2022 FINDINGS: Brain: No acute infarct or hemorrhage. Lateral ventricles and midline structures are unremarkable. No acute extra-axial fluid collections. No mass effect. Vascular: No hyperdense vessel or unexpected calcification. Skull: Normal. Negative for fracture or focal lesion. Sinuses/Orbits: No acute finding. Other: None. IMPRESSION: 1. No acute intracranial process. Electronically Signed   By: Sharlet Salina M.D.   On: 06/29/2023 19:31   DG Chest 2 View  Result Date: 06/29/2023 CLINICAL DATA:  Chest pain. EXAM: CHEST - 2 VIEW COMPARISON:  June 02, 2023. FINDINGS: The heart size and mediastinal contours are within normal limits. Both lungs are clear. The visualized skeletal structures are unremarkable. IMPRESSION: No active cardiopulmonary disease. Electronically Signed   By: Lupita Raider M.D.   On: 06/29/2023 17:13    Pending Labs Unresulted Labs (From admission, onward)     Start     Ordered   06/29/23 2017  HIV Antibody (routine testing w rflx)  (HIV Antibody (Routine testing w reflex) panel)  Once,   R        06/29/23 2019            Vitals/Pain Today's Vitals   06/29/23 1727 06/29/23 1740 06/29/23 1745 06/29/23 1747  BP:   (!) 143/89   Pulse:   89   Resp:   18   Temp: (!) 97.5 F (36.4 C)  TempSrc: Oral     SpO2:   94%   Weight:      Height:      PainSc:  5   4     Isolation Precautions No active isolations  Medications Medications  nitroGLYCERIN (NITROSTAT) SL tablet 0.4 mg (0.4 mg Sublingual Given 06/29/23 1741)  acetaminophen (TYLENOL) tablet 650 mg (has no administration in time range)  ondansetron (ZOFRAN) injection 4 mg (has no administration in time range)  enoxaparin (LOVENOX) injection 40 mg (has no administration in time range)  metoCLOPramide (REGLAN) injection 10 mg (10 mg Intravenous Given 06/29/23 1551)    Mobility walks     Focused Assessments Cardiac Assessment Handoff:  Cardiac  Rhythm: Normal sinus rhythm No results found for: "CKTOTAL", "CKMB", "CKMBINDEX", "TROPONINI" Lab Results  Component Value Date   DDIMER 0.44 06/29/2023   Does the Patient currently have chest pain? No    R Recommendations: See Admitting Provider Note  Report given to:   Additional Notes: n/a

## 2023-06-29 NOTE — H&P (Signed)
History and Physical    Patient: Casey [name] UJW:[id number] DOB: 1971/02/19 DOA: 06/29/2023 DOS: the patient was seen and examined on 06/29/2023 PCP: Olive Bass, FNP  Patient coming from: {Point_of_Origin:26777}  Chief Complaint:  Chief Complaint  Patient presents with   Hypertension   Chest Pain   HPI: Casey [name] is a 52 y.o. female with medical history significant of ***  Review of Systems: {ROS_Text:26778} Past Medical History:  Diagnosis Date   Albuminuria 06/27/2015   Anxiety 11/04/2014   Arm DVT (deep venous thromboembolism), acute, left (HCC) 08/17/2022   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/30/2014   Asthma    Atopic dermatitis 10/30/2008   Formatting of this note might be different from the original. Atopic dermatitis Formatting of this note might be different from the original. Formatting of this note might be different from the original. Atopic dermatitis Formatting of this note might be different from the original. Formatting of this note might be different from the original. Formatting of this note might be different from the or   Cataract 05/06/2015   Contusion of left wrist 07/08/2021   Crohn's disease (HCC) 04/21/2015   De Quervain's tenosynovitis, left 07/31/2021   Diabetes mellitus without complication (HCC)    Diabetes type 2, controlled (HCC) 04/21/2015   Dry eyes 05/06/2015   DVT (deep venous thrombosis) (HCC)    Dysfunction of both eustachian tubes 08/31/2012   Encounter for monitoring immunomodulating therapy 11/20/2021   Environmental allergies 08/12/2020   Esophageal candidiasis (HCC) 06/26/2022   Fibrositis 08/28/2014   Formatting of this note might be different from the original. Fibromyalgia Formatting of this note might be different from the original. Formatting of this note might be different from the original. Fibromyalgia   Flank pain 11/24/2020   Gastroesophageal reflux disease with esophagitis without  hemorrhage 08/01/2019   Formatting of this note might be different from the original. Formatting of this note might be different from the original. 07/2019: chronic symptoms of esophageal reflux since prior to sleeve gastrectomy surgery. Symptoms have worsened over the last 6 months with recent EGD findings of grade B esophagitis, irregular z line, erythematous mucosa, and evidence of sleeve gastrectomy. 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 Formatting of this note might be different from the original. 07/2019: 8 mm colonic polyp removed during colonoscopy, biopsy showed serrated polyp. Repeat colonoscopy recommended in one year. Formatting of this note might be different from the original. Added automatically from request for surgery [phone] Formatting of this note might b   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   History of kidney stones 08/12/2020   Hypercholesterolemia 10/07/2022   Hypertension, essential, benign 06/27/2015   Hypertensive disorder 08/28/2014   Formatting of this note might be different from the original. Hypertension Formatting of this note might be different from the original. Formatting of this note might be different from the original. 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 Keratoconus Formatting of this note might be different from the original. Formatting of this note might be different from the original. Keratoconus Formatting of  this note might be different from the original. Formatting of this note might be different from the original.  Keratoconus Formatting of this note might be different from the or   Microhematuria 06/27/2015   Migraine 08/28/2014   Formatting of this note might be different from the original. Migraine Formatting of this note might be different from the original. Formatting of this note might be different from the original. Migraine Formatting of this note might be different from the original. Migraine Formatting of this note might be different from the original. Migraine Formatting of this note might be different from the or   Migraine without aura and without status migrainosus, not intractable 08/28/2014   Formatting of this note might be different from the original. Formatting of this note might be different from the original. Formatting of this note might be different from the original. Migraine Formatting of this note might be different from the original. Migraine Formatting of this note might be different from the original. Formatting of this note might be different from the original. Migraine F   Mild intermittent asthma without complication 10/30/2008   Formatting of this note might be different from the original. Formatting of this note might be different from the original. Formatting of this note might be different from the original. Asthma Formatting of this note might be different from the original. Asthma Formatting of this note might be different from the original. Formatting of this note might be different from the original. 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 Formatting of this note might be different from the original. 01/25/2016 with Dr. Dionisio David at [facility]. [place] of this note might be different from the original. 01/25/2016 with Dr. Dionisio David at [facility]. [place] of this note might be different from the original. Formatting of this note might be different from    Seronegative inflammatory arthritis 08/28/2014   Formatting of this note might be different from the original. Formatting of this note might be different from the original. Arthritis; diagnosed as IBD related - 2012 Formatting of this note might be different from the original. Followed by rheumatologist. Had to change providers due to an insurance change earlier this year resulting in patient not being able to have Remicade for ~6 months. 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 Formatting of this note might be different from the original. Treated with PRN miralax. Recent colonoscopy 07/2019. Formatting of this note might be different from the original. Treated with PRN miralax. Recent colonoscopy 07/2019. Formatting of this note might be different from the original. Formatting of this note might be different f   Status post placement of ureteral stent 02/06/2021   Tooth infection 08/12/2020   Ureteral stone 01/20/2022   Urinary tract infection, site not specified  06/26/2022   Past Surgical History:  Procedure Laterality Date   BACK SURGERY     L5-S1   CERVICAL CONE BIOPSY  04/2000   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   EYE SURGERY     GASTRIC BYPASS  2017   Sleve   LAPAROSCOPIC GASTRIC SLEEVE RESECTION     Low back disc  surgery     06/2000   TOTAL KNEE ARTHROPLASTY Right 11/2016   Social History:  reports that she has never smoked. She has never been exposed to tobacco smoke. She has never used smokeless tobacco. She reports current alcohol use. She reports that she does not use drugs.  Allergies  Allergen Reactions   Albuterol Sulfate Shortness Of Breath    Other reaction(s): Other (See Comments)  can't breathe  Other reaction(s): Other (See Comments) can't breathe   Tape     Other reaction(s): Other (See Comments), Other (See Comments), Other (See Comments)  severe skin breakdown  Cast and Bandage Cover  severe skin breakdown  severe skin breakdown   Formoterol Other (See Comments)    Other reaction(s): Headaches  Other reaction(s): Headaches  Other reaction(s): Headaches  Headaches  Other reaction(s): Headaches Other reaction(s): Headaches    Other reaction(s): Headaches    Headaches   Hydrocodone Rash   Hydrocodone-Acetaminophen Rash   Nickel Rash and Other (See Comments)   Other Rash    cast and bandage use  cast and bandage use  cast and bandage use  cast and bandage use    cast and bandage use cast and bandage use   Oxycodone-Acetaminophen Other (See Comments)    headache   Salmeterol Rash   Strawberry Extract Hives    Full body rash from strawberry   Advair Hfa [Fluticasone-Salmeterol]     unknown   Asa [Aspirin]    Cleocin [Clindamycin]    Clindamycin/Lincomycin    Fludrocortisone Acetate    Gabapentin    Hydrochlorothiazide    Imipramine    Ipratropium Bromide    Medroxyprogesterone    Meloxicam    Metformin And Related    Nystatin    Penicillins    Pred Forte [Prednisolone]    Prednisone    Proventil Hfa [Albuterol]    Solu-Medrol [Methylprednisolone]    Sulfa Antibiotics    Tegaderm Ag Mesh [Silver]    Toradol [Ketorolac Tromethamine]    Tramadol     Family History  Adopted: Yes  Problem Relation Age of Onset   Diabetes Paternal Grandmother     Diabetes Father    Hypertension Father    Diabetes Mother    Hypertension Mother    Hypertension Brother    Diabetes Brother    Hypertension Sister    Diabetes Sister    Cancer Other    Asthma Neg Hx    Allergic rhinitis Neg Hx    Atopy Neg Hx    Eczema Neg Hx     Prior to Admission medications   Medication Sig Start Date End Date Taking? Authorizing Provider  acetaminophen (TYLENOL) 500 MG tablet Take 1,000 mg by mouth every 12 (twelve) hours as needed for mild pain or moderate pain.   Yes [provider]  albuterol (PROVENTIL) (2.5 MG/3ML) 0.083% nebulizer solution Take 3 mLs (2.5 mg total) by nebulization every 6 (six) hours as needed for wheezing or shortness of breath. 11/30/22  Yes Olive Bass, FNP  atorvastatin (LIPITOR) 40 MG tablet Take 1 tablet (40 mg total) by mouth daily. Patient taking differently: Take  40 mg by mouth daily. Taking 2 tablets once a day 06/13/23 09/11/23 Yes Georgeanna Lea, MD  B Complex-C (B-COMPLEX WITH VITAMIN C) tablet Take 1 tablet by mouth daily. 09/06/19  Yes [provider]  Cholecalciferol (VITAMIN D3) 50 MCG (2000 UT) TABS Take 1 tablet by mouth daily.   Yes [provider]  clopidogrel (PLAVIX) 75 MG tablet Take 1 tablet (75 mg total) by mouth daily. 03/23/23  Yes Georgeanna Lea, MD  EPINEPHrine (EPIPEN 2-PAK) 0.3 mg/0.3 mL IJ SOAJ injection Inject 0.3 mg into the muscle as needed for anaphylaxis. 01/18/23  Yes Padgett, Pilar Grammes, MD  esomeprazole (NEXIUM) 40 MG capsule Take 1 capsule (40 mg total) by mouth 2 (two) times daily before a meal. Patient taking differently: Take 40 mg by mouth daily. 11/30/22  Yes Olive Bass, FNP  famotidine (PEPCID) 40 MG tablet Take 1 tablet (40 mg total) by mouth daily. 11/30/22  Yes Olive Bass, FNP  Golimumab [facility] ARIA IV) Inject 200 mg into the vein every 8 (eight) weeks.   Yes [provider]  levocetirizine (XYZAL) 5 MG tablet Take  10 mg by mouth every evening.   Yes [provider]  linaclotide Karlene Einstein) 72 MCG capsule Take 72 mcg by mouth daily before breakfast. 03/23/23  Yes [provider]  mirabegron ER (MYRBETRIQ) 50 MG TB24 tablet Take 1 tablet (50 mg total) by mouth daily. 11/30/22  Yes Olive Bass, FNP  montelukast (SINGULAIR) 10 MG tablet Take 1 tablet (10 mg total) by mouth at bedtime. Patient taking differently: Take 10 mg by mouth daily. 11/30/22  Yes Olive Bass, FNP  nitroGLYCERIN (NITROSTAT) 0.4 MG SL tablet Place 1 tablet (0.4 mg total) under the tongue every 5 (five) minutes as needed for chest pain. 10/13/22  Yes Georgeanna Lea, MD  OXYGEN Inhale 2 L into the lungs at bedtime.   Yes [provider]  ZOLMitriptan (ZOMIG) 2.5 MG tablet Take 1 tablet (2.5 mg total) by mouth once for 1 dose. May repeat in 2 hours if headache persists or recurs. Patient taking differently: Take 2.5 mg by mouth daily as needed for migraine. 11/30/22 06/29/23 Yes Olive Bass, FNP    Physical Exam: Vitals:   06/29/23 1630 06/29/23 1700 06/29/23 1727 06/29/23 1745  BP: (!) 140/91 (!) 145/88  (!) 143/89  Pulse:  83  89  Resp: 17 (!) 21  18  Temp:   (!) 97.5 F (36.4 C)   TempSrc:   Oral   SpO2:  93%  94%  Weight:      Height:       *** Data Reviewed: {Tip this will not be part of the note when signed- Document your independent interpretation of telemetry tracing, EKG, lab, Radiology test or any other diagnostic tests. Add any new diagnostic test ordered today. (Optional):26781} {Results:26384}  Assessment and Plan: * Chest pain, rule out acute myocardial infarction Trop neg x2 Improved with NTG though. See HPI for EDP d/w cards. CP obs pathway Tele monitor NPO after MN Message sent to P. Trent for cards eval.  Rheumatoid arthritis of multiple sites with 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 factor (HCC) Med rec pending Looks like she's on biologic though (golimumab Q8  weeks)      Advance Care Planning:   Code Status: Full Code ***  Consults: ***  Family Communication: ***  Severity of Illness: {Observation/Inpatient:21159}  Author: Hillary Bow., DO 06/29/2023 8:55 PM  For on call review [email or web address].

## 2023-06-29 NOTE — ED Provider Notes (Signed)
[place] EMERGENCY DEPARTMENT AT [facility] Provider Note   CSN: [id number] Arrival date & time: 06/29/23  1331     History {Add pertinent medical, surgical, social history, OB history to HPI:1} Chief Complaint  Patient presents with   Hypertension   Chest Pain    Casey [name] is a 52 y.o. female.   Hypertension Associated symptoms include chest pain.  Chest Pain 52 year old female history of hyperlipidemia, prior upper extremity DVT no longer anticoagulation, rheumatoid arthritis, GERD, hypertension, Crohn's disease, type 2 diabetes, migraine, asthma presenting for chest pain.  Patient states since Monday she feels like her blood pressure has been very labile.  It has been higher than normal, diastolic in the 101 teens and systolic up to the 200s at times.  She has had several days of frontal headache, not sudden onset with associated blurry vision.  Is also had chest pain since yesterday.  Chest pain started suddenly yesterday with exertion.  It is substernal, pressure-like and nonradiating.  No pleuritic pain, occasional shortness of breath.  No fevers or chills.  No cough.  No focal weakness or numbness.  To some chronic epigastric pain which is unchanged.  She has some nausea and vomiting yesterday associate with chest pain.  Symptoms seem to be somewhat worse when her blood pressure is higher, she is not currently on blood pressure medications.  She did take some nitro at home which improved her pain.  She saw cardiology in August.  She was noted to have CAD at the time and was put on Plavix.  She also had an infusion for rheumatoid arthritis on Monday.  Had an echo on Friday with grade 2 diastolic dysfunction.     Home Medications Prior to Admission medications   Medication Sig Start Date End Date Taking? Authorizing Provider  acetaminophen (TYLENOL) 500 MG tablet Take 1,000 mg by mouth every 12 (twelve) hours as needed for mild pain or moderate pain.    [provider]  albuterol (PROVENTIL) (2.5 MG/3ML) 0.083% nebulizer solution Take 3 mLs (2.5 mg total) by nebulization every 6 (six) hours as needed for wheezing or shortness of breath. 11/30/22   Olive Bass, FNP  atorvastatin (LIPITOR) 40 MG tablet Take 1 tablet (40 mg total) by mouth daily. Patient taking differently: Take 40 mg by mouth daily. Taking 2 tablets once a day 06/13/23 09/11/23  Georgeanna Lea, MD  B Complex-C (B-COMPLEX WITH VITAMIN C) tablet Take 1 tablet by mouth daily. 09/06/19   [provider]  Cholecalciferol (VITAMIN D3) 50 MCG (2000 UT) TABS Take 1 tablet by mouth daily.    [provider]  clopidogrel (PLAVIX) 75 MG tablet Take 1 tablet (75 mg total) by mouth daily. 03/23/23   Georgeanna Lea, MD  EPINEPHrine (EPIPEN 2-PAK) 0.3 mg/0.3 mL IJ SOAJ injection Inject 0.3 mg into the muscle as needed for anaphylaxis. 01/18/23   Marcelyn Bruins, MD  esomeprazole (NEXIUM) 40 MG capsule Take 1 capsule (40 mg total) by mouth 2 (two) times daily before a meal. 11/30/22   Olive Bass, FNP  famotidine (PEPCID) 40 MG tablet Take 1 tablet (40 mg total) by mouth daily. 11/30/22   Olive Bass, FNP  Golimumab [facility] ARIA IV) Inject 200 mg into the vein every 8 (eight) weeks.    [provider]  levocetirizine (XYZAL) 5 MG tablet Take 5 mg by mouth every evening.    [provider]  linaclotide Karlene Einstein) 72 MCG capsule  Take 72 mcg by mouth daily before breakfast. 03/23/23   [provider]  mirabegron ER (MYRBETRIQ) 50 MG TB24 tablet Take 1 tablet (50 mg total) by mouth daily. 11/30/22   Olive Bass, FNP  montelukast (SINGULAIR) 10 MG tablet Take 1 tablet (10 mg total) by mouth at bedtime. 11/30/22   Olive Bass, FNP  nitroGLYCERIN (NITROSTAT) 0.4 MG SL tablet Place 1 tablet (0.4 mg total) under the tongue every 5 (five) minutes as needed for chest pain. 10/13/22   Georgeanna Lea, MD   OXYGEN Inhale 2 L into the lungs at bedtime.    [provider]  ZOLMitriptan (ZOMIG) 2.5 MG tablet Take 1 tablet (2.5 mg total) by mouth once for 1 dose. May repeat in 2 hours if headache persists or recurs. 11/30/22 06/06/23  Olive Bass, FNP      Allergies    Albuterol sulfate, Tape, Formoterol, Hydrocodone, Hydrocodone-acetaminophen, Nickel, Other, Oxycodone-acetaminophen, Salmeterol, Strawberry extract, Advair hfa [fluticasone-salmeterol], Asa [aspirin], Cleocin [clindamycin], Clindamycin/lincomycin, Fludrocortisone acetate, Gabapentin, Hydrochlorothiazide, Imipramine, Ipratropium bromide, Medroxyprogesterone, Meloxicam, Metformin and related, Nystatin, Penicillins, Pred forte [prednisolone], Prednisone, Proventil hfa [albuterol], Solu-medrol [methylprednisolone], Sulfa antibiotics, Tegaderm ag mesh [silver], Toradol [ketorolac tromethamine], and Tramadol    Review of Systems   Review of Systems  Cardiovascular:  Positive for chest pain.  Review of systems completed and notable as per HPI.  ROS otherwise negative.   Physical Exam Updated Vital Signs BP (!) 139/124 (BP Location: Right Arm)   Pulse (!) 106   Temp 98.2 F (36.8 C) (Oral)   Resp 18   Ht 5\' 3"  (1.6 m)   Wt 101.6 kg   SpO2 99%   BMI 39.68 kg/m  Physical Exam Vitals and nursing note reviewed.  Constitutional:      General: She is not in acute distress.    Appearance: She is well-developed.  HENT:     Head: Normocephalic and atraumatic.     Mouth/Throat:     Mouth: Mucous membranes are moist.     Pharynx: Oropharynx is clear.  Eyes:     Extraocular Movements: Extraocular movements intact.     Conjunctiva/sclera: Conjunctivae normal.     Pupils: Pupils are equal, round, and reactive to light.  Cardiovascular:     Rate and Rhythm: Normal rate and regular rhythm.     Pulses: Normal pulses.     Heart sounds: Normal heart sounds. No murmur heard. Pulmonary:     Effort: Pulmonary effort is  normal. No respiratory distress.     Breath sounds: Normal breath sounds.  Abdominal:     Palpations: Abdomen is soft.     Tenderness: There is no abdominal tenderness. There is no guarding or rebound.  Musculoskeletal:        General: No swelling.     Cervical back: Neck supple.     Right lower leg: No edema.     Left lower leg: No edema.  Skin:    General: Skin is warm and dry.     Capillary Refill: Capillary refill takes less than 2 seconds.  Neurological:     General: No focal deficit present.     Mental Status: She is alert and oriented to person, place, and time. Mental status is at baseline.  Psychiatric:        Mood and Affect: Mood normal.     ED Results / Procedures / Treatments   Labs (all labs ordered are listed, but only abnormal results are displayed) Labs Reviewed  CBC - Abnormal; Notable for the following components:      Result Value   WBC 21.3 (*)    RBC 5.52 (*)    MCH 25.4 (*)    RDW 16.2 (*)    Platelets 417 (*)    All other components within normal limits  COMPREHENSIVE METABOLIC PANEL - Abnormal; Notable for the following components:   Glucose, Bld 127 (*)    Total Protein 8.6 (*)    All other components within normal limits  CBG MONITORING, ED  TROPONIN I (HIGH SENSITIVITY)    EKG None  Radiology No results found.  Procedures Procedures  {Document cardiac monitor, telemetry assessment procedure when appropriate:1}  Medications Ordered in ED Medications - No data to display  ED Course/ Medical Decision Making/ A&P   {   Click here for ABCD2, HEART and other calculatorsREFRESH Note before signing :1}                              Medical Decision Making Amount and/or Complexity of Data Reviewed Labs: ordered. Radiology: ordered.  Risk Prescription drug management.   Medical Decision Making:   COLUMBINE ACRE is a 52 y.o. female who presented to the ED today with chest pain, headache, nausea, vomiting.  Here she is mildly  tachycardic, hypertensive.  She reports labile blood pressure over the last week.  She is currently concerned about chest pain that started suddenly yesterday.  Exertional, substernal, pressure-like associate with some nausea.  Somewhat improved right now but still present at rest.  Concern for possible anginal symptoms.  EKG shows inferior T wave inversions which appear worsened from prior, will give nitro for pain.  Will get D-dimer as well given history of DVT although seems less likely.  Lower suspicion for dissection, she is not markedly tachycardic, no radiation pain to her back, I do not centimeters time widening.  She is significant leukocytosis, unclear if this is related to recent fusion for her to arthritis or not.  Will obtain CT head about her headache which is worsened.  She has had no fevers, no neck stiffness and headache is anterior has been going on for several days, I do not think this is CNS infection.  Could be worsening migraine or related to recent nitro use.   {crccomplexity:27900} Reviewed and confirmed nursing documentation for past medical history, family history, social history.  Reassessment and Plan:   ***    Patient's presentation is most consistent with {EM COPA:27473}     {Document critical care time when appropriate:1} {Document review of labs and clinical decision tools ie heart score, Chads2Vasc2 etc:1}  {Document your independent review of radiology images, and any outside records:1} {Document your discussion with family members, caretakers, and with consultants:1} {Document social determinants of health affecting pt's care:1} {Document your decision making why or why not admission, treatments were needed:1} Final Clinical Impression(s) / ED Diagnoses Final diagnoses:  None    Rx / DC Orders ED Discharge Orders     None

## 2023-06-30 ENCOUNTER — Observation Stay (HOSPITAL_COMMUNITY): Payer: 59

## 2023-06-30 DIAGNOSIS — R079 Chest pain, unspecified: Secondary | ICD-10-CM | POA: Diagnosis not present

## 2023-06-30 DIAGNOSIS — Z86718 Personal history of other venous thrombosis and embolism: Secondary | ICD-10-CM

## 2023-06-30 DIAGNOSIS — I10 Essential (primary) hypertension: Secondary | ICD-10-CM | POA: Diagnosis not present

## 2023-06-30 DIAGNOSIS — I25110 Atherosclerotic heart disease of native coronary artery with unstable angina pectoris: Secondary | ICD-10-CM | POA: Diagnosis not present

## 2023-06-30 DIAGNOSIS — E119 Type 2 diabetes mellitus without complications: Secondary | ICD-10-CM | POA: Diagnosis not present

## 2023-06-30 LAB — GLUCOSE, CAPILLARY: Glucose-Capillary: 125 mg/dL — ABNORMAL HIGH (ref 70–99)

## 2023-06-30 MED ORDER — IOHEXOL 350 MG/ML SOLN
75.0000 mL | Freq: Once | INTRAVENOUS | Status: AC | PRN
  Administered 2023-06-30: 75 mL via INTRAVENOUS

## 2023-06-30 MED ORDER — SODIUM CHLORIDE 0.9 % WEIGHT BASED INFUSION
1.0000 mL/kg/h | INTRAVENOUS | Status: DC
  Administered 2023-07-01: 1 mL/kg/h via INTRAVENOUS

## 2023-06-30 MED ORDER — ALBUTEROL SULFATE (2.5 MG/3ML) 0.083% IN NEBU: 2.5000 mg | INHALATION_SOLUTION | Freq: Four times a day (QID) | RESPIRATORY_TRACT | Status: DC | PRN

## 2023-06-30 MED ORDER — LINACLOTIDE 72 MCG PO CAPS
72.0000 ug | ORAL_CAPSULE | Freq: Every day | ORAL | Status: DC
  Administered 2023-06-30 – 2023-07-02 (×3): 72 ug via ORAL
  Filled 2023-06-30 (×3): qty 1

## 2023-06-30 MED ORDER — PANTOPRAZOLE SODIUM 40 MG PO TBEC
80.0000 mg | DELAYED_RELEASE_TABLET | Freq: Two times a day (BID) | ORAL | Status: DC
  Filled 2023-06-30: qty 2

## 2023-06-30 MED ORDER — FAMOTIDINE 20 MG PO TABS
40.0000 mg | ORAL_TABLET | Freq: Every day | ORAL | Status: DC
  Administered 2023-06-30 – 2023-07-02 (×3): 40 mg via ORAL
  Filled 2023-06-30 (×3): qty 2

## 2023-06-30 MED ORDER — LORATADINE 10 MG PO TABS
10.0000 mg | ORAL_TABLET | Freq: Every evening | ORAL | Status: DC
  Administered 2023-06-30 – 2023-07-01 (×2): 10 mg via ORAL
  Filled 2023-06-30 (×2): qty 1

## 2023-06-30 MED ORDER — B COMPLEX-C PO TABS
1.0000 | ORAL_TABLET | Freq: Every day | ORAL | Status: DC
  Administered 2023-06-30 – 2023-07-02 (×3): 1 via ORAL
  Filled 2023-06-30 (×3): qty 1

## 2023-06-30 MED ORDER — VITAMIN D 25 MCG (1000 UNIT) PO TABS
2000.0000 [IU] | ORAL_TABLET | Freq: Every day | ORAL | Status: DC
  Administered 2023-06-30 – 2023-07-02 (×3): 2000 [IU] via ORAL
  Filled 2023-06-30 (×3): qty 2

## 2023-06-30 MED ORDER — MONTELUKAST SODIUM 10 MG PO TABS
10.0000 mg | ORAL_TABLET | Freq: Every day | ORAL | Status: DC
  Administered 2023-06-30 – 2023-07-02 (×3): 10 mg via ORAL
  Filled 2023-06-30 (×4): qty 1

## 2023-06-30 MED ORDER — ATORVASTATIN CALCIUM 80 MG PO TABS
80.0000 mg | ORAL_TABLET | Freq: Every day | ORAL | Status: DC
  Administered 2023-06-30 – 2023-07-02 (×3): 80 mg via ORAL
  Filled 2023-06-30 (×3): qty 1

## 2023-06-30 MED ORDER — AMLODIPINE BESYLATE 5 MG PO TABS
5.0000 mg | ORAL_TABLET | Freq: Every day | ORAL | Status: DC
Start: 2023-06-30 — End: 2023-07-02
  Administered 2023-06-30 – 2023-07-02 (×3): 5 mg via ORAL
  Filled 2023-06-30 (×3): qty 1

## 2023-06-30 MED ORDER — SODIUM CHLORIDE 0.9 % WEIGHT BASED INFUSION
3.0000 mL/kg/h | INTRAVENOUS | Status: DC
  Administered 2023-07-01: 3 mL/kg/h via INTRAVENOUS

## 2023-06-30 MED ORDER — MIRABEGRON ER 50 MG PO TB24
50.0000 mg | ORAL_TABLET | Freq: Every day | ORAL | Status: DC
  Administered 2023-06-30 – 2023-07-02 (×3): 50 mg via ORAL
  Filled 2023-06-30 (×3): qty 1

## 2023-06-30 MED ORDER — CLOPIDOGREL BISULFATE 75 MG PO TABS
75.0000 mg | ORAL_TABLET | Freq: Every day | ORAL | Status: DC
  Administered 2023-06-30: 75 mg via ORAL
  Filled 2023-06-30: qty 1

## 2023-06-30 NOTE — Plan of Care (Signed)

## 2023-06-30 NOTE — Assessment & Plan Note (Signed)
Cont home BP meds ?

## 2023-06-30 NOTE — Assessment & Plan Note (Signed)
Cont statin

## 2023-06-30 NOTE — Care Management (Signed)
  Transition of Care [facility]) Screening Note   Patient Details  Name: Casey [name] Date of Birth: 08/10/1970   Transition of [facility]) CM/SW Contact:    Lockie Pares, RN Phone Number: 06/30/2023, 9:34 AM    Transition of Care Department [facility]) has reviewed patient and no TOC needs have been identified at this time. We will continue to monitor patient advancement through interdisciplinary progression rounds. If new patient transition needs arise, please place a TOC consult.

## 2023-06-30 NOTE — Progress Notes (Signed)
Casey [name]  WUJ:[id number] DOB: November 08, 1970 DOA: 06/29/2023 PCP: Olive Bass, FNP    Brief Narrative:  52 year old with a history of Crohn's disease, HLD, HTN, RA on a biologic agent, DM2, complex hiatal hernia status post gastric sleeve, and upper extremity DVT who presented to the ER 11/20 with a 2-day history of progressively worsening chest pain which is worse with exertion, pressure-like in nature, and located in the substernal region.  CTa of the chest was negative for pulmonary embolism.   Of note she underwent coronary CT angiogram ~May 2024 which revealed moderate stenosis of the proximal LAD at which time Plavix (ASA allergy) and atorvastatin were initiated.  A TTE at the same time revealed a preserved EF.  Goals of Care:   Code Status: Full Code   DVT prophylaxis: enoxaparin (LOVENOX) injection 40 mg Start: 06/30/23 1000  Interim Hx: No acute events recorded since admission.  Afebrile.  Vital signs stable.  No new complaints at the time of visit.  Patient is resting comfortably in bed.  She is alert conversant and ready to proceed with cardiac cath.  Assessment & Plan:  Chest pain with known CAD It appears coronary CT is the only evaluation she has had previously -given her known proximal LAD disease and her symptoms suggestive of angina I suspect she will warrant a cardiac cath -cardiology consulted - no evidence of acute MI presently  Severe persistent asthma Followed by Dr. Judeth Horn at [facility] pulmonary -treated with albuterol nebulizer with treatment options limited by multiple drug allergies  HLD Continue usual medical therapy  Rheumatoid arthritis No change in usual medical therapy  HTN Continue usual home BP meds  DM2 CBG well-controlled at present  Family Communication: No family present at time of exam Disposition: Anticipate discharge home when medically cleared   Objective: Blood pressure (!) 150/94, pulse 76, temperature 98 F (36.7 C),  temperature source Oral, resp. rate 18, height 5\' 3"  (1.6 m), weight 100.8 kg, SpO2 97%. No intake or output data in the 24 hours ending 06/30/23 0957 Filed Weights   06/29/23 1456 06/29/23 2137  Weight: 101.6 kg 100.8 kg    Examination: General: No acute respiratory distress Lungs: Clear to auscultation bilaterally without wheezes or crackles Cardiovascular: Regular rate and rhythm without murmur gallop or rub normal S1 and S2 Abdomen: Nontender, nondistended, soft, bowel sounds positive, no rebound, no ascites, no appreciable mass Extremities: No significant cyanosis, clubbing, or edema bilateral lower extremities  CBC: Recent Labs  Lab 06/29/23 1342  WBC 21.3*  HGB 14.0  HCT 45.5  MCV 82.4  PLT 417*   Basic Metabolic Panel: Recent Labs  Lab 06/29/23 1342  NA 139  K 4.6  CL 105  CO2 23  GLUCOSE 127*  BUN 10  CREATININE 0.85  CALCIUM 9.8   GFR: Estimated Creatinine Clearance: 85.8 mL/min (by C-G formula based on SCr of 0.85 mg/dL).   Scheduled Meds:  atorvastatin  80 mg Oral Daily   B-complex with vitamin C  1 tablet Oral Daily   cholecalciferol  2,000 Units Oral Daily   clopidogrel  75 mg Oral Daily   enoxaparin (LOVENOX) injection  40 mg Subcutaneous Q24H   famotidine  40 mg Oral Daily   linaclotide  72 mcg Oral QAC breakfast   loratadine  10 mg Oral QPM   mirabegron ER  50 mg Oral Daily   montelukast  10 mg Oral Daily   pantoprazole  80 mg Oral BID AC  LOS: 0 days   Lonia Blood, MD [facility] Office  [phone] Pager - Text Page per Amion  If 7PM-7AM, please contact night-coverage per Amion 06/30/2023, 9:57 AM

## 2023-06-30 NOTE — Consult Note (Addendum)
Cardiology Consultation   Patient ID: Casey [name] MRN: [id number]; DOB: 09-17-1970  Admit date: 06/29/2023 Date of Consult: 06/30/2023  PCP:  Olive Bass, FNP   [facility] Providers Cardiologist:  Gypsy Balsam, MD   Patient Profile:   Casey [name] is a 52 y.o. female with a hx of CAD by CCTA, hypertension, hyperlipidemia, s/p laparoscopic sleeve gastric surgery, and recent hiatal hernia surgery, prior DVT not on anticoagulation who is being seen 06/30/2023 for the evaluation of chest pain at the request of Dr. Sharon Seller.  History of Present Illness:   Ms. Sergent established care with Dr. Bing Matter 10/2022 for evaluation of chest pain. She underwent coronary CTA 3/12/20224 with coronary calcium score 287 placing her at the 99th percentile. Moderate stenosis of 50-69% in proximal LAD with negative FFR. Also showed a moderate hiatal hernia.  Echocardiogram showed LVEF 60-65%  with normal RV and no significant valvular disease. She was placed on plavix and 20 mg lipitor.  She has a history of right UE DVT with some residual clot on [place] 08/2022. No longer on OAC.   Sleep study 04/2023 negative for OSA.   She presented to [facility] on 11/20 for evaluation of chest pain.  At the time of her presentation in the emergency department patient reported elevated blood pressure this week. She also had several days of headache.  Chest pain began on Tuesday and was associated with exertion. First occurrence was while working on [employer].  Described to me as a sharp sensation and was nonradiating.  Pain was noted to improve with nitroglycerin. Pain recurred with exertion on Wednesday after walking around [place]. Patient bought a BP cuff at [facility] and noted systolic BP near 409. Given recurrent chest pain on exertion, patient called EMS. Per patient, EMS measured BP greater than 200/100. In addition to her hypertension and CP, patient also reports some nausea with emesis as well as  palpitations/rapid HR. High-sensitivity troponin checked in the emergency department, negative.  However, given EKG with inferior T wave inversions (particularly lead III) felt to be worsening from prior EKGs, there was concern of cardiac etiology.  ED labs did note leukocytosis with a white blood cell count of 21.3 (up from 14 as of 11/18)  Complete metabolic panel without significant derangement.  D-dimer negative.  UA with cloudy appearance but rare bacteria and no nitrites.  CTA chest negative for pulmonary embolism. On exam today, patient without active chest pain though did have some with ambulation up and down unit hallway earlier. Denies dyspnea, palpitations today.  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 Atopic dermatitis Formatting of this note might be different from the original. Formatting of this note might be different from the original. Atopic dermatitis Formatting of this note might be different from the original. Formatting of this note might be different from the original. Formatting of this note might be different from the or   Cataract 05/06/2015   Contusion of left wrist 07/08/2021   Crohn's disease (HCC) 04/21/2015   De Quervain's tenosynovitis, left 07/31/2021   Diabetes mellitus without complication (HCC)    Diabetes type 2, controlled (HCC) 04/21/2015   Dry eyes 05/06/2015   DVT (deep venous thrombosis) (HCC)  Dysfunction of both eustachian tubes 08/31/2012   Encounter for monitoring immunomodulating therapy 11/20/2021   Environmental allergies 08/12/2020   Esophageal candidiasis (HCC) 06/26/2022   Fibrositis 08/28/2014   Formatting of this note might be different from the original.  Fibromyalgia Formatting of this note might be different from the original. Formatting of this note might be different from the original. Fibromyalgia   Flank pain 11/24/2020   Gastroesophageal reflux disease with esophagitis without hemorrhage 08/01/2019   Formatting of this note might be different from the original. Formatting of this note might be different from the original. 07/2019: chronic symptoms of esophageal reflux since prior to sleeve gastrectomy surgery. Symptoms have worsened over the last 6 months with recent EGD findings of grade B esophagitis, irregular z line, erythematous mucosa, and evidence of sleeve gastrectomy. 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 Formatting of this note might be different from the original. 07/2019: 8 mm colonic polyp removed during colonoscopy, biopsy showed serrated polyp. Repeat colonoscopy recommended in one year. Formatting of this note might be different from the original. 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 Hypertension Formatting of this note might be different from the original. Formatting of this note might be different from the original. 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 Keratoconus Formatting of this note might be  different from the original. Formatting of this note might be different from the original. Keratoconus Formatting of this note might be different from the original. Formatting of this note might be different from the original. Keratoconus Formatting of this note might be different from the or   Microhematuria 06/27/2015   Migraine 08/28/2014   Formatting of this note might be different from the original. Migraine Formatting of this note might be different from the original. Formatting of this note might be different from the original. Migraine Formatting of this note might be different from the original. Migraine Formatting of this note might be different from the original. Migraine Formatting of this note might be different from the or   Migraine without aura and without status migrainosus, not intractable 08/28/2014   Formatting of this note might be different from the original. Formatting of this note might be different from the original. Formatting of this note might be different from the original. Migraine Formatting of this note might be different from the original. Migraine Formatting of this note might be different from the original. Formatting of this note might be different from the original. Migraine F   Mild intermittent asthma without complication 10/30/2008   Formatting of this note might be different from the original. Formatting of this note might be different from the original. Formatting of this note might be different from the original. Asthma Formatting of this note might be different from the original. Asthma Formatting of this  note might be different from the original. Formatting of this note might be different from the original. 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 Formatting of this note might be different from the original. 01/25/2016 with Dr. Dionisio David at [facility]. [place] of this note might be different from the original. 01/25/2016 with Dr. Dionisio David at [facility]. [place] of this note might be different from the original. Formatting of this note might be different from    Seronegative inflammatory arthritis 08/28/2014   Formatting of this note might be different from the original. Formatting of this note might be different from the original. Arthritis; diagnosed as IBD related - 2012 Formatting of this note might be different from the original. Followed by rheumatologist. Had to change providers due to an insurance change earlier this year resulting in patient not being able to have Remicade for ~6 months. 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 Formatting of this note might be different from the original. Treated with PRN miralax. Recent colonoscopy 07/2019. Formatting of this note might be different from the original. Treated with PRN miralax. Recent colonoscopy 07/2019. Formatting of this note might be different from the original. Formatting of this note might be different f   Status post placement of ureteral stent 02/06/2021   Tooth infection 08/12/2020   Ureteral stone 01/20/2022   Urinary tract infection, site not specified 06/26/2022    Past Surgical History:   Procedure Laterality Date   BACK SURGERY     L5-S1   CERVICAL CONE BIOPSY  04/2000   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   EYE SURGERY     GASTRIC BYPASS  2017   Sleve   LAPAROSCOPIC GASTRIC SLEEVE RESECTION     Low back disc surgery     06/2000   TOTAL KNEE ARTHROPLASTY Right 11/2016     Home Medications:  Prior to Admission medications   Medication Sig Start Date End Date Taking? Authorizing Provider  acetaminophen (TYLENOL) 500 MG tablet Take 1,000 mg by mouth every 12 (twelve) hours as needed for mild pain or moderate pain.   Yes [provider]  albuterol (PROVENTIL) (2.5 MG/3ML) 0.083% nebulizer solution Take 3 mLs (2.5 mg total) by nebulization every 6 (six) hours as needed for wheezing or shortness of breath. 11/30/22  Yes Olive Bass, FNP  atorvastatin (LIPITOR) 40 MG tablet Take 1 tablet (40 mg total) by mouth daily. Patient taking differently: Take 80 mg by mouth daily. 06/13/23 09/11/23 Yes Georgeanna Lea, MD  B Complex-C (B-COMPLEX WITH VITAMIN C) tablet Take 1 tablet by mouth daily. 09/06/19  Yes [provider]  Cholecalciferol (VITAMIN D3) 50 MCG (2000 UT) TABS Take 1 tablet by mouth daily.   Yes [provider]  clopidogrel (PLAVIX) 75  MG tablet Take 1 tablet (75 mg total) by mouth daily. 03/23/23  Yes Georgeanna Lea, MD  EPINEPHrine (EPIPEN 2-PAK) 0.3 mg/0.3 mL IJ SOAJ injection Inject 0.3 mg into the muscle as needed for anaphylaxis. 01/18/23  Yes Padgett, Pilar Grammes, MD  esomeprazole (NEXIUM) 40 MG capsule Take 1 capsule (40 mg total) by mouth 2 (two) times daily before a meal. Patient taking differently: Take 40 mg by mouth daily. 11/30/22  Yes Olive Bass, FNP  famotidine (PEPCID) 40 MG tablet Take 1 tablet (40 mg total) by mouth daily. 11/30/22  Yes Olive Bass, FNP  Golimumab [facility] ARIA IV) Inject 200 mg into the vein every 8 (eight) weeks.   Yes [provider]  levocetirizine (XYZAL) 5  MG tablet Take 10 mg by mouth every evening.   Yes [provider]  linaclotide Karlene Einstein) 72 MCG capsule Take 72 mcg by mouth daily before breakfast. 03/23/23  Yes [provider]  mirabegron ER (MYRBETRIQ) 50 MG TB24 tablet Take 1 tablet (50 mg total) by mouth daily. 11/30/22  Yes Olive Bass, FNP  montelukast (SINGULAIR) 10 MG tablet Take 1 tablet (10 mg total) by mouth at bedtime. Patient taking differently: Take 10 mg by mouth daily. 11/30/22  Yes Olive Bass, FNP  nitroGLYCERIN (NITROSTAT) 0.4 MG SL tablet Place 1 tablet (0.4 mg total) under the tongue every 5 (five) minutes as needed for chest pain. 10/13/22  Yes Georgeanna Lea, MD  OXYGEN Inhale 2 L into the lungs at bedtime.   Yes [provider]  ZOLMitriptan (ZOMIG) 2.5 MG tablet Take 1 tablet (2.5 mg total) by mouth once for 1 dose. May repeat in 2 hours if headache persists or recurs. Patient taking differently: Take 2.5 mg by mouth daily as needed for migraine. 11/30/22 06/29/23 Yes Olive Bass, FNP    Inpatient Medications: Scheduled Meds:  atorvastatin  80 mg Oral Daily   B-complex with vitamin C  1 tablet Oral Daily   cholecalciferol  2,000 Units Oral Daily   clopidogrel  75 mg Oral Daily   enoxaparin (LOVENOX) injection  40 mg Subcutaneous Q24H   famotidine  40 mg Oral Daily   linaclotide  72 mcg Oral QAC breakfast   loratadine  10 mg Oral QPM   mirabegron ER  50 mg Oral Daily   montelukast  10 mg Oral Daily   pantoprazole  80 mg Oral BID AC   Continuous Infusions:  PRN Meds: acetaminophen, albuterol, nitroGLYCERIN, ondansetron (ZOFRAN) IV  Allergies:    Allergies  Allergen Reactions   Albuterol Sulfate Shortness Of Breath    Other reaction(s): Other (See Comments)  can't breathe  Other reaction(s): Other (See Comments) can't breathe   Tape     Other reaction(s): Other (See Comments), Other (See Comments), Other (See Comments)  severe skin breakdown   Cast and Bandage Cover  severe skin breakdown  severe skin breakdown   Formoterol Other (See Comments)    Other reaction(s): Headaches  Other reaction(s): Headaches  Other reaction(s): Headaches  Headaches  Other reaction(s): Headaches Other reaction(s): Headaches    Other reaction(s): Headaches    Headaches   Hydrocodone Rash   Hydrocodone-Acetaminophen Rash   Nickel Rash and Other (See Comments)   Other Rash    cast and bandage use  cast and bandage use  cast and bandage use  cast and bandage use    cast and bandage use cast and bandage use   Oxycodone-Acetaminophen Other (See  Comments)    headache   Salmeterol Rash   Strawberry Extract Hives    Full body rash from strawberry   Advair Hfa [Fluticasone-Salmeterol]     unknown   Proventil Hfa [Albuterol]     unknown   Asa [Aspirin] Rash   Cleocin [Clindamycin] Rash   Clindamycin/Lincomycin Rash   Fludrocortisone Acetate Rash   Gabapentin Rash   Hydrochlorothiazide Rash   Imipramine Rash   Ipratropium Bromide Rash   Medroxyprogesterone Rash   Meloxicam Rash   Metformin And Related Rash   Nystatin Rash   Penicillins Rash   Pred Forte [Prednisolone] Rash   Prednisone Rash   Solu-Medrol [Methylprednisolone] Rash   Sulfa Antibiotics Rash   Tegaderm Ag Mesh [Silver] Rash   Toradol [Ketorolac Tromethamine] Rash   Tramadol Rash    Social History:   Social History   Socioeconomic History   Marital status: Married    Spouse name: Not on file   Number of children: Not on file   Years of education: Not on file   Highest education level: Not on file  Occupational History   Not on file  Tobacco Use   Smoking status: Never    Passive exposure: Never   Smokeless tobacco: Never  Vaping Use   Vaping status: Never Used  Substance and Sexual Activity   Alcohol use: Yes    Comment: rare   Drug use: Never   Sexual activity: Yes  Other Topics Concern   Not on file  Social History Narrative   Not on file    Social Determinants of Health   Financial Resource Strain: Medium Risk (10/01/2022)   Received from [facility], [facility]   Overall Financial Resource Strain (CARDIA)    Difficulty of Paying Living Expenses: Somewhat hard  Food Insecurity: No Food Insecurity (06/29/2023)   Hunger Vital Sign    Worried About Running Out of Food in the Last Year: Never true    Ran Out of Food in the Last Year: Never true  Transportation Needs: No Transportation Needs (06/29/2023)   PRAPARE - Administrator, Civil Service (Medical): No    Lack of Transportation (Non-Medical): No  Physical Activity: Unknown (10/01/2022)   Received from [facility], [facility]   Exercise Vital Sign    Days of Exercise per Week: 0 days    Minutes of Exercise per Session: Not on file  Stress: No Stress Concern Present (05/16/2023)   Received from [facility] - Occupational Stress Questionnaire    Feeling of Stress : Not at all  Social Connections: Somewhat Isolated (10/01/2022)   Received from [facility], [facility]   Social Network    How would you rate your social network (family, work, friends)?: Restricted participation with some degree of social isolation  Intimate Partner Violence: Not At Risk (06/29/2023)   Humiliation, Afraid, Rape, and Kick questionnaire    Fear of Current or Ex-Partner: No    Emotionally Abused: No    Physically Abused: No    Sexually Abused: No    Family History:    Family History  Adopted: Yes  Problem Relation Age of Onset   Diabetes Paternal Grandmother    Diabetes Father    Hypertension Father    Diabetes Mother    Hypertension Mother    Hypertension Brother    Diabetes Brother    Hypertension Sister    Diabetes Sister    Cancer Other  Asthma Neg Hx    Allergic rhinitis Neg Hx    Atopy Neg Hx    Eczema Neg Hx      ROS:  Please see the history of present illness.   All other ROS reviewed and  negative.     Physical Exam/Data:   Vitals:   06/29/23 1745 06/29/23 2137 06/30/23 0424 06/30/23 0742  BP: (!) 143/89 (!) 166/97 (!) 177/93 (!) 150/94  Pulse: 89  71 76  Resp: 18 17  18   Temp:  97.8 F (36.6 C) 97.8 F (36.6 C) 98 F (36.7 C)  TempSrc:  Oral Oral Oral  SpO2: 94% 98% 97% 97%  Weight:  100.8 kg    Height:  5\' 3"  (1.6 m)     No intake or output data in the 24 hours ending 06/30/23 0747    06/29/2023    9:37 PM 06/29/2023    2:56 PM 06/13/2023    1:27 PM  Last 3 Weights  Weight (lbs) 222 lb 4.8 oz 224 lb 224 lb 9.6 oz  Weight (kg) 100.835 kg 101.606 kg 101.878 kg     Body mass index is 39.38 kg/m.  General:  Well nourished, well developed, in no acute distress HEENT: normal Neck: no JVD Vascular: No carotid bruits; Distal pulses 2+ bilaterally Cardiac:  normal S1, S2; RRR; no murmur  Lungs:  clear to auscultation bilaterally, no wheezing, rhonchi or rales  Abd: soft, nontender, no hepatomegaly  Ext: no edema Musculoskeletal:  No deformities, BUE and BLE strength normal and equal Skin: warm and dry  Neuro:  CNs 2-12 intact, no focal abnormalities noted Psych:  Normal affect   EKG:  The EKG was personally reviewed and demonstrates:  sinus rhythm with TWI in leads III and AVF. Lead III TWI appears more prominent than in prior tracings.  Telemetry:  Telemetry was personally reviewed and demonstrates:  sinus rhythm/sinus bradycardia  Relevant CV Studies:  Cardiac Studies & Procedures       ECHOCARDIOGRAM  ECHOCARDIOGRAM COMPLETE 06/24/2023  Narrative ECHOCARDIOGRAM REPORT    Patient Name:   SHUNIKA FILLMORE Date of Exam: 06/24/2023 Medical Rec #:  [id number]    Height:       63.0 in Accession #:    [id number]   Weight:       224.6 lb Date of Birth:  [birth year]    BSA:          2.031 m Patient Age:    52 years     BP:           118/82 mmHg Patient Gender: F            HR:           65 bpm. [facility]  Procedure: 2D Echo, Cardiac  Doppler and Color Doppler  Indications:    R06.9 DOE  History:        Patient has prior history of Echocardiogram examinations, most recent 10/14/2022. CAD, Chron's disease, Signs/Symptoms:Chest Pain, Dizziness/Lightheadedness and Dyspnea; Risk Factors:Morbid obesity, Hypertension, Non-Smoker, Diabetes and Dyslipidemia. UE-DVT.  Sonographer:    Dominica Severin RCS, RVS Referring Phys: [phone] Marveen Reeks KRASOWSKI   Sonographer Comments: Global longitudinal strain was attempted. IMPRESSIONS   1. Left ventricular ejection fraction, by estimation, is 60 to 65%. The left ventricle has normal function. The left ventricle has no regional wall motion abnormalities. Left ventricular diastolic parameters are consistent with Grade II diastolic dysfunction (pseudonormalization). 2. Right ventricular systolic function is normal.  The right ventricular size is normal. Tricuspid regurgitation signal is inadequate for assessing PA pressure. 3. The mitral valve is grossly normal. Trivial mitral valve regurgitation. 4. The aortic valve is tricuspid. Aortic valve regurgitation is not visualized. No aortic stenosis is present. Aortic valve mean gradient measures 4.0 mmHg. 5. The inferior vena cava is normal in size with greater than 50% respiratory variability, suggesting right atrial pressure of 3 mmHg.  Comparison(s): Prior images reviewed side by side. LVEF normal range at 60-65% with moderate diastolic dysfunction.  FINDINGS Left Ventricle: Left ventricular ejection fraction, by estimation, is 60 to 65%. The left ventricle has normal function. The left ventricle has no regional wall motion abnormalities. Global longitudinal strain performed but not reported based on interpreter judgement due to suboptimal tracking. The left ventricular internal cavity size was normal in size. There is borderline left ventricular hypertrophy. Left ventricular diastolic parameters are consistent with Grade II diastolic  dysfunction (pseudonormalization).  Right Ventricle: The right ventricular size is normal. No increase in right ventricular wall thickness. Right ventricular systolic function is normal. Tricuspid regurgitation signal is inadequate for assessing PA pressure.  Left Atrium: Left atrial size was normal in size.  Right Atrium: Right atrial size was normal in size.  Pericardium: There is no evidence of pericardial effusion.  Mitral Valve: The mitral valve is grossly normal. Trivial mitral valve regurgitation. MV peak gradient, 3.0 mmHg. The mean mitral valve gradient is 1.0 mmHg.  Tricuspid Valve: The tricuspid valve is grossly normal. Tricuspid valve regurgitation is trivial.  Aortic Valve: The aortic valve is tricuspid. Aortic valve regurgitation is not visualized. No aortic stenosis is present. Aortic valve mean gradient measures 4.0 mmHg. Aortic valve peak gradient measures 6.2 mmHg. Aortic valve area, by VTI measures 2.80 cm.  Pulmonic Valve: The pulmonic valve was grossly normal. Pulmonic valve regurgitation is not visualized.  Aorta: The aortic root and ascending aorta are structurally normal, with no evidence of dilitation.  Venous: The inferior vena cava is normal in size with greater than 50% respiratory variability, suggesting right atrial pressure of 3 mmHg.  IAS/Shunts: No atrial level shunt detected by color flow Doppler.   LEFT VENTRICLE PLAX 2D LVIDd:         4.30 cm     Diastology LVIDs:         2.60 cm     LV e' medial:    5.33 cm/s LV PW:         1.00 cm     LV E/e' medial:  18.3 LV IVS:        1.00 cm     LV e' lateral:   5.33 cm/s LVOT diam:     2.10 cm     LV E/e' lateral: 18.3 LV SV:         66 LV SV Index:   32 LVOT Area:     3.46 cm  LV Volumes (MOD) LV vol d, MOD A2C: 47.5 ml LV vol d, MOD A4C: 62.2 ml LV vol s, MOD A2C: 30.7 ml LV vol s, MOD A4C: 22.0 ml LV SV MOD A2C:     16.8 ml LV SV MOD A4C:     62.2 ml LV SV MOD BP:      28.9 ml  RIGHT  VENTRICLE RV Basal diam:  2.60 cm RV Mid diam:    3.20 cm RV S prime:     15.70 cm/s TAPSE (M-mode): 2.7 cm  LEFT ATRIUM  Index        RIGHT ATRIUM           Index LA diam:      2.90 cm 1.43 cm/m   RA Area:     10.30 cm LA Vol (A4C): 26.3 ml 12.95 ml/m  RA Volume:   19.30 ml  9.50 ml/m AORTIC VALVE                    PULMONIC VALVE AV Area (Vmax):    2.56 cm     PV Vmax:       1.15 m/s AV Area (Vmean):   2.45 cm     PV Peak grad:  5.3 mmHg AV Area (VTI):     2.80 cm AV Vmax:           124.00 cm/s AV Vmean:          89.200 cm/s AV VTI:            0.235 m AV Peak Grad:      6.2 mmHg AV Mean Grad:      4.0 mmHg LVOT Vmax:         91.60 cm/s LVOT Vmean:        63.100 cm/s LVOT VTI:          0.190 m LVOT/AV VTI ratio: 0.81  AORTA Ao Root diam: 3.00 cm Ao Asc diam:  3.30 cm  MITRAL VALVE MV Area (PHT): 3.02 cm    SHUNTS MV Area VTI:   2.47 cm    Systemic VTI:  0.19 m MV Peak grad:  3.0 mmHg    Systemic Diam: 2.10 cm MV Mean grad:  1.0 mmHg MV Vmax:       0.86 m/s MV Vmean:      53.8 cm/s MV Decel Time: 251 msec MV E velocity: 97.30 cm/s MV A velocity: 83.10 cm/s MV E/A ratio:  1.17  Nona Dell MD Electronically signed by Nona Dell MD Signature Date/Time: 06/24/2023/4:46:03 PM    Final              Laboratory Data:  High Sensitivity Troponin:   Recent Labs  Lab 06/29/23 1342 06/29/23 1539  TROPONINIHS 6 6     Chemistry Recent Labs  Lab 06/29/23 1342  NA 139  K 4.6  CL 105  CO2 23  GLUCOSE 127*  BUN 10  CREATININE 0.85  CALCIUM 9.8  GFRNONAA >60  ANIONGAP 11    Recent Labs  Lab 06/29/23 1342  PROT 8.6*  ALBUMIN 3.7  AST 26  ALT 21  ALKPHOS 86  BILITOT 0.6   Lipids No results for input(s): "CHOL", "TRIG", "HDL", "LABVLDL", "LDLCALC", "CHOLHDL" in the last 168 hours.  Hematology Recent Labs  Lab 06/29/23 1342  WBC 21.3*  RBC 5.52*  HGB 14.0  HCT 45.5  MCV 82.4  MCH 25.4*  MCHC 30.8  RDW 16.2*  PLT 417*    Thyroid No results for input(s): "TSH", "FREET4" in the last 168 hours.  BNPNo results for input(s): "BNP", "PROBNP" in the last 168 hours.  DDimer  Recent Labs  Lab 06/29/23 1539  DDIMER 0.44     Radiology/Studies:  CT Angio Chest Pulmonary Embolism (PE) W or WO Contrast  Result Date: 06/30/2023 CLINICAL DATA:  52 year old female with chest pain. EXAM: CT ANGIOGRAPHY CHEST WITH CONTRAST TECHNIQUE: Multidetector CT imaging of the chest was performed using the standard protocol during bolus administration of intravenous contrast. Multiplanar CT image reconstructions and MIPs  were obtained to evaluate the vascular anatomy. RADIATION DOSE REDUCTION: This exam was performed according to the departmental dose-optimization program which includes automated exposure control, adjustment of the mA and/or kV according to patient size and/or use of iterative reconstruction technique. CONTRAST:  75mL OMNIPAQUE IOHEXOL 350 MG/ML SOLN COMPARISON:  Chest radiographs yesterday.  CTA chest 10/05/2022. FINDINGS: Cardiovascular: Excellent contrast bolus timing in the pulmonary arterial tree. No pulmonary artery filling defect. Negative visible aorta. Heart size is stable and within normal limits. No pericardial effusion. But there is calcified coronary artery atherosclerosis on series 7, image 192. Mediastinum/Nodes: No mediastinal mass or lymphadenopathy. Substantial regression of gastric hiatal hernia compared to February, minimal residual. Lungs/Pleura: Mildly improved lung volumes. Major airways are patent. Mild curvilinear atelectasis in the left lung is similar to the prior CTA. No consolidation, pleural effusion, or convincing pulmonary inflammation. Upper Abdomen: Chronic postoperative changes to the stomach in the abdomen, probably gastric sleeve. Negative visible mostly noncontrast liver, gallbladder, spleen, pancreas, adrenal glands, kidneys, and other bowel loops. Musculoskeletal: Negative. Review of the  MIP images confirms the above findings. IMPRESSION: 1. Negative for pulmonary embolus. 2. Calcified coronary artery atherosclerosis. 3. Substantially decreased gastric hiatal hernia since a February CTA. Underlying chronic postoperative changes to the stomach, probably gastric sleeve. Electronically Signed   By: Odessa Fleming M.D.   On: 06/30/2023 04:25   CT Head Wo Contrast  Result Date: 06/29/2023 CLINICAL DATA:  Altered level of consciousness, hypertension EXAM: CT HEAD WITHOUT CONTRAST TECHNIQUE: Contiguous axial images were obtained from the base of the skull through the vertex without intravenous contrast. RADIATION DOSE REDUCTION: This exam was performed according to the departmental dose-optimization program which includes automated exposure control, adjustment of the mA and/or kV according to patient size and/or use of iterative reconstruction technique. COMPARISON:  08/12/2022 FINDINGS: Brain: No acute infarct or hemorrhage. Lateral ventricles and midline structures are unremarkable. No acute extra-axial fluid collections. No mass effect. Vascular: No hyperdense vessel or unexpected calcification. Skull: Normal. Negative for fracture or focal lesion. Sinuses/Orbits: No acute finding. Other: None. IMPRESSION: 1. No acute intracranial process. Electronically Signed   By: Sharlet Salina M.D.   On: 06/29/2023 19:31   DG Chest 2 View  Result Date: 06/29/2023 CLINICAL DATA:  Chest pain. EXAM: CHEST - 2 VIEW COMPARISON:  June 02, 2023. FINDINGS: The heart size and mediastinal contours are within normal limits. Both lungs are clear. The visualized skeletal structures are unremarkable. IMPRESSION: No active cardiopulmonary disease. Electronically Signed   By: Lupita Raider M.D.   On: 06/29/2023 17:13     Assessment and Plan:   Chest pain CAD  CTA 3/12/20224 with coronary calcium score 287 placing her at the 99th percentile. Moderate stenosis of 50-69% in proximal LAD with negative FFR. 06/24/23 TTE  with LVEF 60-65%, grade II DD, trivial mitral valve regurgitation. HS troponin this admission negative. ECG with more prominent TWI in lead III.  Differential diagnosis includes hypertensive urgency vs progressive CAD vs microvascular disease vs GI cause (recent hernia repair on 05/16/23). Although not clearly cardiac, given exertional symptoms with known proximal LAD disease per CTA, favor cardiac catheterization for definitive ischemic evaluation. Would also consider microvascular disease evaluation.  Continue Plavix 75mg  Patient previously on Imdur but did have headaches, not currently taking.  HR is borderline for beta blocker (down to 50 when at rest/sleeping). Start amlodipine for BP/angina.  Informed Consent   Shared Decision Making/Informed Consent{ The risks [stroke (1 in 1000), death (1 in 1000),  kidney failure [usually temporary] (1 in 500), bleeding (1 in 200), allergic reaction [possibly serious] (1 in 200)], benefits (diagnostic support and management of coronary artery disease) and alternatives of a cardiac catheterization were discussed in detail with Ms. Orear and she is willing to proceed.     Hypertension  Patient admitted with significant increase in BP from baseline per home readings. Not currently taking anti-hypertensives. Previously on hydrochlorothiazide (remote past). Also was briefly on Imdur earlier this year but had significant headache. BP moderately elevated since admission with systolic BP 130s-140s.  As above, borderline BP for beta blocker and patient previously intolerant of Imdur. Start Amlodipine 5mg  for hypertension/angina.  Hyperlipidemia with LDL goal less than 70  LDL 94 on 06/01/23 lipid panel. Increase Atorvastatin to 80mg .   Per primary team: Leukocytosis DM type II RA Asthma   Risk Assessment/Risk Scores:           For questions or updates, please contact [facility] Please consult [email or web address] for contact info under     Signed, Perlie Gold, PA-C  Patient seen and examined with Perlie Gold PA-C.  Agree as above, with the following exceptions and changes as noted below.  Patient seen ambulating the halls today in good spirits but with exertional chest pain.  She does note that when her activity stops her chest pain subsides.  She was notably mildly short of breath while walking and talking. Gen: NAD, CV: RRR, no murmurs, Lungs: clear, Abd: soft, Extrem: Warm, well perfused, no edema, Neuro/Psych: alert and oriented x 3, normal mood and affect. All available labs, radiology testing, previous records reviewed.  Known history of moderate CAD on coronary CT, I cannot independently review the images but my partner interpreted this study as moderate CAD in the proximal LAD with a negative CT FFR.  Patient has recently undergone hiatal hernia surgery but describes her chest pain as exertional.  With concern for progressing CAD, cardiac catheterization was recommended and cardiology consulted.  I discussed this with the patient who is in agreement that proceeding to cardiac catheterization may be warranted as she remains very concerned about obstructive CAD, EKG was mildly abnormal compared to prior with T wave inversions inferiorly.  Troponins negative.  We did review reported normal FFR CT and reassuring results from March.  She does require continued risk factor modification as LDL is not yet at goal.  Plan for cardiac catheterization tomorrow.  Consent as noted above.  Parke Poisson, MD 06/30/23 5:28 PM

## 2023-07-01 ENCOUNTER — Other Ambulatory Visit (HOSPITAL_COMMUNITY): Payer: Self-pay

## 2023-07-01 ENCOUNTER — Telehealth (HOSPITAL_COMMUNITY): Payer: Self-pay | Admitting: Pharmacy Technician

## 2023-07-01 ENCOUNTER — Encounter (HOSPITAL_COMMUNITY): Payer: Self-pay | Admitting: Cardiovascular Disease

## 2023-07-01 ENCOUNTER — Encounter (HOSPITAL_COMMUNITY)
Admission: EM | Disposition: A | Discharge status: Home / Self Care | Payer: Self-pay | Source: Home / Self Care | Attending: Emergency Medicine

## 2023-07-01 DIAGNOSIS — I25110 Atherosclerotic heart disease of native coronary artery with unstable angina pectoris: Secondary | ICD-10-CM

## 2023-07-01 DIAGNOSIS — E119 Type 2 diabetes mellitus without complications: Secondary | ICD-10-CM | POA: Diagnosis not present

## 2023-07-01 DIAGNOSIS — I10 Essential (primary) hypertension: Secondary | ICD-10-CM | POA: Diagnosis not present

## 2023-07-01 DIAGNOSIS — Z86718 Personal history of other venous thrombosis and embolism: Secondary | ICD-10-CM | POA: Diagnosis not present

## 2023-07-01 DIAGNOSIS — R079 Chest pain, unspecified: Secondary | ICD-10-CM | POA: Diagnosis not present

## 2023-07-01 HISTORY — PX: CORONARY STENT INTERVENTION: CATH118234

## 2023-07-01 HISTORY — PX: LEFT HEART CATH AND CORONARY ANGIOGRAPHY: CATH118249

## 2023-07-01 LAB — CBC
HCT: 38.1 % (ref 36.0–46.0)
Hemoglobin: 12.2 g/dL (ref 12.0–15.0)
MCH: 26 pg (ref 26.0–34.0)
MCHC: 32 g/dL (ref 30.0–36.0)
MCV: 81.2 fL (ref 80.0–100.0)
Platelets: 287 10*3/uL (ref 150–400)
RBC: 4.69 MIL/uL (ref 3.87–5.11)
RDW: 16.6 % — ABNORMAL HIGH (ref 11.5–15.5)
WBC: 9.9 10*3/uL (ref 4.0–10.5)
nRBC: 0 % (ref 0.0–0.2)

## 2023-07-01 SURGERY — LEFT HEART CATH AND CORONARY ANGIOGRAPHY
Anesthesia: LOCAL

## 2023-07-01 MED ORDER — LIDOCAINE HCL (PF) 1 % IJ SOLN
INTRAMUSCULAR | Status: AC
Start: 2023-07-01 — End: ?
  Filled 2023-07-01: qty 30

## 2023-07-01 MED ORDER — SODIUM CHLORIDE 0.9% FLUSH: 3.0000 mL | Freq: Two times a day (BID) | INTRAVENOUS | Status: DC

## 2023-07-01 MED ORDER — MIDAZOLAM HCL 2 MG/2ML IJ SOLN
INTRAMUSCULAR | Status: AC
  Filled 2023-07-01: qty 2

## 2023-07-01 MED ORDER — HEPARIN (PORCINE) IN NACL 1000-0.9 UT/500ML-% IV SOLN
INTRAVENOUS | Status: DC | PRN
  Administered 2023-07-01 (×2): 500 mL

## 2023-07-01 MED ORDER — VERAPAMIL HCL 2.5 MG/ML IV SOLN
INTRAVENOUS | Status: DC | PRN
  Administered 2023-07-01: 10 mL via INTRA_ARTERIAL

## 2023-07-01 MED ORDER — VERAPAMIL HCL 2.5 MG/ML IV SOLN
INTRAVENOUS | Status: AC
  Filled 2023-07-01: qty 2

## 2023-07-01 MED ORDER — SODIUM CHLORIDE 0.9% FLUSH: 3.0000 mL | INTRAVENOUS | Status: DC | PRN

## 2023-07-01 MED ORDER — TICAGRELOR 90 MG PO TABS
90.0000 mg | ORAL_TABLET | Freq: Two times a day (BID) | ORAL | 5 refills | Status: DC
  Filled 2023-07-01: qty 60, 30d supply, fill #0

## 2023-07-01 MED ORDER — SODIUM CHLORIDE 0.9 % IV SOLN: 250.0000 mL | INTRAVENOUS | Status: DC | PRN

## 2023-07-01 MED ORDER — NITROGLYCERIN 1 MG/10 ML FOR IR/CATH LAB
INTRA_ARTERIAL | Status: DC | PRN
  Administered 2023-07-01 (×2): 200 ug via INTRACORONARY

## 2023-07-01 MED ORDER — TICAGRELOR 90 MG PO TABS
ORAL_TABLET | ORAL | Status: AC
  Filled 2023-07-01: qty 1

## 2023-07-01 MED ORDER — NITROGLYCERIN 1 MG/10 ML FOR IR/CATH LAB
INTRA_ARTERIAL | Status: AC
  Filled 2023-07-01: qty 10

## 2023-07-01 MED ORDER — HEPARIN SODIUM (PORCINE) 1000 UNIT/ML IJ SOLN
INTRAMUSCULAR | Status: DC | PRN
  Administered 2023-07-01 (×2): 5000 [IU] via INTRAVENOUS

## 2023-07-01 MED ORDER — LIDOCAINE HCL (PF) 1 % IJ SOLN
INTRAMUSCULAR | Status: DC | PRN
  Administered 2023-07-01: 2 mL

## 2023-07-01 MED ORDER — SODIUM CHLORIDE 0.9 % IV SOLN: INTRAVENOUS | Status: AC

## 2023-07-01 MED ORDER — TICAGRELOR 90 MG PO TABS
90.0000 mg | ORAL_TABLET | Freq: Two times a day (BID) | ORAL | Status: DC
  Administered 2023-07-01 – 2023-07-02 (×2): 90 mg via ORAL
  Filled 2023-07-01 (×2): qty 1

## 2023-07-01 MED ORDER — FENTANYL CITRATE (PF) 100 MCG/2ML IJ SOLN
INTRAMUSCULAR | Status: AC
  Filled 2023-07-01: qty 2

## 2023-07-01 MED ORDER — FENTANYL CITRATE (PF) 100 MCG/2ML IJ SOLN
INTRAMUSCULAR | Status: DC | PRN
  Administered 2023-07-01: 50 ug via INTRAVENOUS
  Administered 2023-07-01: 25 ug via INTRAVENOUS

## 2023-07-01 MED ORDER — MIDAZOLAM HCL 2 MG/2ML IJ SOLN
INTRAMUSCULAR | Status: DC | PRN
  Administered 2023-07-01 (×2): 1 mg via INTRAVENOUS

## 2023-07-01 MED ORDER — TICAGRELOR 90 MG PO TABS
ORAL_TABLET | ORAL | Status: DC | PRN
  Administered 2023-07-01: 180 mg via ORAL

## 2023-07-01 MED ORDER — HEPARIN SODIUM (PORCINE) 1000 UNIT/ML IJ SOLN
INTRAMUSCULAR | Status: AC
  Filled 2023-07-01: qty 10

## 2023-07-01 SURGICAL SUPPLY — 15 items
BALL SAPPHIRE NC24 3.25X12 (BALLOONS) ×1
BALLOON SAPPHIRE NC24 3.25X12 (BALLOONS) IMPLANT
CATH INFINITI AMBI 5FR JK (CATHETERS) IMPLANT
CATH LAUNCHER 6FR EBU3.5 (CATHETERS) IMPLANT
DEVICE RAD COMP TR BAND LRG (VASCULAR PRODUCTS) IMPLANT
GLIDESHEATH SLEND SS 6F .021 (SHEATH) IMPLANT
GUIDEWIRE INQWIRE 1.5J.035X260 (WIRE) IMPLANT
INQWIRE 1.5J .035X260CM (WIRE) ×1
KIT ENCORE 26 ADVANTAGE (KITS) IMPLANT
PACK CARDIAC CATHETERIZATION (CUSTOM PROCEDURE TRAY) ×1 IMPLANT
SET ATX-X65L (MISCELLANEOUS) IMPLANT
SHEATH PROBE COVER 6X72 (BAG) IMPLANT
STENT SYNERGY XD 3.0X20 (Permanent Stent) IMPLANT
SYNERGY XD 3.0X20 (Permanent Stent) ×1 IMPLANT
WIRE RUNTHROUGH .014X180CM (WIRE) IMPLANT

## 2023-07-01 NOTE — Progress Notes (Addendum)
Rounding Note    Patient Name: Casey [name] Date of Encounter: 07/01/2023  [facility] Cardiologist: Gypsy Balsam, MD   Subjective   CP is 3/10, Tylenol does not help. Agrees that SBP a little low for nitroglycerin. Awaiting cath  Inpatient Medications    Scheduled Meds:  amLODipine  5 mg Oral Daily   atorvastatin  80 mg Oral Daily   B-complex with vitamin C  1 tablet Oral Daily   cholecalciferol  2,000 Units Oral Daily   clopidogrel  75 mg Oral Daily   enoxaparin (LOVENOX) injection  40 mg Subcutaneous Q24H   famotidine  40 mg Oral Daily   linaclotide  72 mcg Oral QAC breakfast   loratadine  10 mg Oral QPM   mirabegron ER  50 mg Oral Daily   montelukast  10 mg Oral Daily   Continuous Infusions:  sodium chloride 1 mL/kg/hr (07/01/23 0515)   PRN Meds: acetaminophen, albuterol, nitroGLYCERIN, ondansetron (ZOFRAN) IV   Vital Signs    Vitals:   06/30/23 0742 06/30/23 1211 06/30/23 2050 07/01/23 0342  BP: (!) 150/94 (!) 140/87 135/76 (!) 118/57  Pulse: 76 60 71 (!) 58  Resp: 18 16 15 13   Temp: 98 F (36.7 C) 98.1 F (36.7 C) 98.1 F (36.7 C) 97.7 F (36.5 C)  TempSrc: Oral Oral Oral Oral  SpO2: 97% 98% 94%   Weight:      Height:       No intake or output data in the 24 hours ending 07/01/23 0940    06/29/2023    9:37 PM 06/29/2023    2:56 PM 06/13/2023    1:27 PM  Last 3 Weights  Weight (lbs) 222 lb 4.8 oz 224 lb 224 lb 9.6 oz  Weight (kg) 100.835 kg 101.606 kg 101.878 kg      Telemetry    SR - Personally Reviewed  ECG    None today - Personally Reviewed  Physical Exam   GEN: No acute distress.   Neck: No JVD Cardiac: RRR, no murmurs, rubs, or gallops.  Respiratory: Clear to auscultation bilaterally. GI: Soft, nontender, non-distended  MS: No edema; No deformity. Skin: small skin tear still oozing a little, bandage put on, staff held pressure and stopped bleeding Neuro:  Nonfocal  Psych: Normal affect   Labs    High  Sensitivity Troponin:   Recent Labs  Lab 06/29/23 1342 06/29/23 1539  TROPONINIHS 6 6     Chemistry Recent Labs  Lab 06/29/23 1342  NA 139  K 4.6  CL 105  CO2 23  GLUCOSE 127*  BUN 10  CREATININE 0.85  CALCIUM 9.8  PROT 8.6*  ALBUMIN 3.7  AST 26  ALT 21  ALKPHOS 86  BILITOT 0.6  GFRNONAA >60  ANIONGAP 11    Lipids No results for input(s): "CHOL", "TRIG", "HDL", "LABVLDL", "LDLCALC", "CHOLHDL" in the last 168 hours.  Hematology Recent Labs  Lab 06/29/23 1342 07/01/23 0507  WBC 21.3* 9.9  RBC 5.52* 4.69  HGB 14.0 12.2  HCT 45.5 38.1  MCV 82.4 81.2  MCH 25.4* 26.0  MCHC 30.8 32.0  RDW 16.2* 16.6*  PLT 417* 287   Thyroid No results for input(s): "TSH", "FREET4" in the last 168 hours.  BNPNo results for input(s): "BNP", "PROBNP" in the last 168 hours.  DDimer  Recent Labs  Lab 06/29/23 1539  DDIMER 0.44     Radiology    CT Angio Chest Pulmonary Embolism (PE) W or WO Contrast  Result Date: 06/30/2023 CLINICAL DATA:  52 year old female with chest pain. EXAM: CT ANGIOGRAPHY CHEST WITH CONTRAST TECHNIQUE: Multidetector CT imaging of the chest was performed using the standard protocol during bolus administration of intravenous contrast. Multiplanar CT image reconstructions and MIPs were obtained to evaluate the vascular anatomy. RADIATION DOSE REDUCTION: This exam was performed according to the departmental dose-optimization program which includes automated exposure control, adjustment of the mA and/or kV according to patient size and/or use of iterative reconstruction technique. CONTRAST:  75mL OMNIPAQUE IOHEXOL 350 MG/ML SOLN COMPARISON:  Chest radiographs yesterday.  CTA chest 10/05/2022. FINDINGS: Cardiovascular: Excellent contrast bolus timing in the pulmonary arterial tree. No pulmonary artery filling defect. Negative visible aorta. Heart size is stable and within normal limits. No pericardial effusion. But there is calcified coronary artery atherosclerosis on  series 7, image 192. Mediastinum/Nodes: No mediastinal mass or lymphadenopathy. Substantial regression of gastric hiatal hernia compared to February, minimal residual. Lungs/Pleura: Mildly improved lung volumes. Major airways are patent. Mild curvilinear atelectasis in the left lung is similar to the prior CTA. No consolidation, pleural effusion, or convincing pulmonary inflammation. Upper Abdomen: Chronic postoperative changes to the stomach in the abdomen, probably gastric sleeve. Negative visible mostly noncontrast liver, gallbladder, spleen, pancreas, adrenal glands, kidneys, and other bowel loops. Musculoskeletal: Negative. Review of the MIP images confirms the above findings. IMPRESSION: 1. Negative for pulmonary embolus. 2. Calcified coronary artery atherosclerosis. 3. Substantially decreased gastric hiatal hernia since a February CTA. Underlying chronic postoperative changes to the stomach, probably gastric sleeve. Electronically Signed   By: Odessa Fleming M.D.   On: 06/30/2023 04:25   CT Head Wo Contrast  Result Date: 06/29/2023 CLINICAL DATA:  Altered level of consciousness, hypertension EXAM: CT HEAD WITHOUT CONTRAST TECHNIQUE: Contiguous axial images were obtained from the base of the skull through the vertex without intravenous contrast. RADIATION DOSE REDUCTION: This exam was performed according to the departmental dose-optimization program which includes automated exposure control, adjustment of the mA and/or kV according to patient size and/or use of iterative reconstruction technique. COMPARISON:  08/12/2022 FINDINGS: Brain: No acute infarct or hemorrhage. Lateral ventricles and midline structures are unremarkable. No acute extra-axial fluid collections. No mass effect. Vascular: No hyperdense vessel or unexpected calcification. Skull: Normal. Negative for fracture or focal lesion. Sinuses/Orbits: No acute finding. Other: None. IMPRESSION: 1. No acute intracranial process. Electronically Signed   By:  Sharlet Salina M.D.   On: 06/29/2023 19:31   DG Chest 2 View  Result Date: 06/29/2023 CLINICAL DATA:  Chest pain. EXAM: CHEST - 2 VIEW COMPARISON:  June 02, 2023. FINDINGS: The heart size and mediastinal contours are within normal limits. Both lungs are clear. The visualized skeletal structures are unremarkable. IMPRESSION: No active cardiopulmonary disease. Electronically Signed   By: Lupita Raider M.D.   On: 06/29/2023 17:13    Cardiac Studies   CARDIAC CATH: pending  CT CHEST PE PROTOCOL: 06/30/2023 IMPRESSION: 1. Negative for pulmonary embolus. 2. Calcified coronary artery atherosclerosis. 3. Substantially decreased gastric hiatal hernia since a February CTA. Underlying chronic postoperative changes to the stomach, probably gastric sleeve.  ECHO: 06/24/2023 (done as outpt)  1. Left ventricular ejection fraction, by estimation, is 60 to 65%. The left ventricle has normal function. The left ventricle has no regional wall motion abnormalities. Left ventricular diastolic parameters are consistent with Grade II diastolic  dysfunction (pseudonormalization).   2. Right ventricular systolic function is normal. The right ventricular size is normal. Tricuspid regurgitation signal is inadequate for assessing PA pressure.  3. The mitral valve is grossly normal. Trivial mitral valve  regurgitation.   4. The aortic valve is tricuspid. Aortic valve regurgitation is not  visualized. No aortic stenosis is present. Aortic valve mean gradient measures 4.0 mmHg.   5. The inferior vena cava is normal in size with greater than 50% respiratory variability, suggesting right atrial pressure of 3 mmHg.   Comparison(s): Prior images reviewed side by side. LVEF normal range at 60-65% with moderate diastolic dysfunction.   Patient Profile     52 y.o. female with a hx of CAD by CCTA, hypertension, hyperlipidemia, s/p laparoscopic sleeve gastric surgery, and recent hiatal hernia surgery, prior DVT not on  anticoagulation, was admitted 11/20 with chest pain, Cards asked to see.  Assessment & Plan    Chest pain: - Sx are somewhat pleuritic, ez neg - ECG w/out ST changes - however, cardiac CT 10/20/2022 had 50-69% prox LAD dz, other vessels w/ milder dz. Calcium score 287 - also had  heavy Ca++ seen on CT >> Cath today to eval for CAD - if no PCI needed, consider eval for microvascular dz  2. Chronic pain and other issues, per IM     For questions or updates, please contact [facility] Please consult [email or web address] for contact info under        Signed, Theodore Demark, PA-C  07/01/2023, 9:40 AM    Patient seen and examined with RB PA-C.  Agree as above, with the following exceptions and changes as noted below. Feels well resting in bed with no current chest pain. Gen: NAD, CV: RRR, no murmurs, Lungs: clear, Abd: soft, Extrem: Warm, well perfused, no edema, Neuro/Psych: alert and oriented x 3, normal mood and affect. All available labs, radiology testing, previous records reviewed. Plan for LHC today, patient preference given moderate disease on CCTA though FFR negative. She feels CP is relatively new pain for her and exertional in nature.   Parke Poisson, MD 07/01/23 11:24 AM

## 2023-07-01 NOTE — H&P (View-Only) (Signed)
Rounding Note    Patient Name: Casey [name] Date of Encounter: 07/01/2023  [facility] Cardiologist: Gypsy Balsam, MD   Subjective   CP is 3/10, Tylenol does not help. Agrees that SBP a little low for nitroglycerin. Awaiting cath  Inpatient Medications    Scheduled Meds:  amLODipine  5 mg Oral Daily   atorvastatin  80 mg Oral Daily   B-complex with vitamin C  1 tablet Oral Daily   cholecalciferol  2,000 Units Oral Daily   clopidogrel  75 mg Oral Daily   enoxaparin (LOVENOX) injection  40 mg Subcutaneous Q24H   famotidine  40 mg Oral Daily   linaclotide  72 mcg Oral QAC breakfast   loratadine  10 mg Oral QPM   mirabegron ER  50 mg Oral Daily   montelukast  10 mg Oral Daily   Continuous Infusions:  sodium chloride 1 mL/kg/hr (07/01/23 0515)   PRN Meds: acetaminophen, albuterol, nitroGLYCERIN, ondansetron (ZOFRAN) IV   Vital Signs    Vitals:   06/30/23 0742 06/30/23 1211 06/30/23 2050 07/01/23 0342  BP: (!) 150/94 (!) 140/87 135/76 (!) 118/57  Pulse: 76 60 71 (!) 58  Resp: 18 16 15 13   Temp: 98 F (36.7 C) 98.1 F (36.7 C) 98.1 F (36.7 C) 97.7 F (36.5 C)  TempSrc: Oral Oral Oral Oral  SpO2: 97% 98% 94%   Weight:      Height:       No intake or output data in the 24 hours ending 07/01/23 0940    06/29/2023    9:37 PM 06/29/2023    2:56 PM 06/13/2023    1:27 PM  Last 3 Weights  Weight (lbs) 222 lb 4.8 oz 224 lb 224 lb 9.6 oz  Weight (kg) 100.835 kg 101.606 kg 101.878 kg      Telemetry    SR - Personally Reviewed  ECG    None today - Personally Reviewed  Physical Exam   GEN: No acute distress.   Neck: No JVD Cardiac: RRR, no murmurs, rubs, or gallops.  Respiratory: Clear to auscultation bilaterally. GI: Soft, nontender, non-distended  MS: No edema; No deformity. Skin: small skin tear still oozing a little, bandage put on, staff held pressure and stopped bleeding Neuro:  Nonfocal  Psych: Normal affect   Labs    High  Sensitivity Troponin:   Recent Labs  Lab 06/29/23 1342 06/29/23 1539  TROPONINIHS 6 6     Chemistry Recent Labs  Lab 06/29/23 1342  NA 139  K 4.6  CL 105  CO2 23  GLUCOSE 127*  BUN 10  CREATININE 0.85  CALCIUM 9.8  PROT 8.6*  ALBUMIN 3.7  AST 26  ALT 21  ALKPHOS 86  BILITOT 0.6  GFRNONAA >60  ANIONGAP 11    Lipids No results for input(s): "CHOL", "TRIG", "HDL", "LABVLDL", "LDLCALC", "CHOLHDL" in the last 168 hours.  Hematology Recent Labs  Lab 06/29/23 1342 07/01/23 0507  WBC 21.3* 9.9  RBC 5.52* 4.69  HGB 14.0 12.2  HCT 45.5 38.1  MCV 82.4 81.2  MCH 25.4* 26.0  MCHC 30.8 32.0  RDW 16.2* 16.6*  PLT 417* 287   Thyroid No results for input(s): "TSH", "FREET4" in the last 168 hours.  BNPNo results for input(s): "BNP", "PROBNP" in the last 168 hours.  DDimer  Recent Labs  Lab 06/29/23 1539  DDIMER 0.44     Radiology    CT Angio Chest Pulmonary Embolism (PE) W or WO Contrast  Result Date: 06/30/2023 CLINICAL DATA:  52 year old female with chest pain. EXAM: CT ANGIOGRAPHY CHEST WITH CONTRAST TECHNIQUE: Multidetector CT imaging of the chest was performed using the standard protocol during bolus administration of intravenous contrast. Multiplanar CT image reconstructions and MIPs were obtained to evaluate the vascular anatomy. RADIATION DOSE REDUCTION: This exam was performed according to the departmental dose-optimization program which includes automated exposure control, adjustment of the mA and/or kV according to patient size and/or use of iterative reconstruction technique. CONTRAST:  75mL OMNIPAQUE IOHEXOL 350 MG/ML SOLN COMPARISON:  Chest radiographs yesterday.  CTA chest 10/05/2022. FINDINGS: Cardiovascular: Excellent contrast bolus timing in the pulmonary arterial tree. No pulmonary artery filling defect. Negative visible aorta. Heart size is stable and within normal limits. No pericardial effusion. But there is calcified coronary artery atherosclerosis on  series 7, image 192. Mediastinum/Nodes: No mediastinal mass or lymphadenopathy. Substantial regression of gastric hiatal hernia compared to February, minimal residual. Lungs/Pleura: Mildly improved lung volumes. Major airways are patent. Mild curvilinear atelectasis in the left lung is similar to the prior CTA. No consolidation, pleural effusion, or convincing pulmonary inflammation. Upper Abdomen: Chronic postoperative changes to the stomach in the abdomen, probably gastric sleeve. Negative visible mostly noncontrast liver, gallbladder, spleen, pancreas, adrenal glands, kidneys, and other bowel loops. Musculoskeletal: Negative. Review of the MIP images confirms the above findings. IMPRESSION: 1. Negative for pulmonary embolus. 2. Calcified coronary artery atherosclerosis. 3. Substantially decreased gastric hiatal hernia since a February CTA. Underlying chronic postoperative changes to the stomach, probably gastric sleeve. Electronically Signed   By: Odessa Fleming M.D.   On: 06/30/2023 04:25   CT Head Wo Contrast  Result Date: 06/29/2023 CLINICAL DATA:  Altered level of consciousness, hypertension EXAM: CT HEAD WITHOUT CONTRAST TECHNIQUE: Contiguous axial images were obtained from the base of the skull through the vertex without intravenous contrast. RADIATION DOSE REDUCTION: This exam was performed according to the departmental dose-optimization program which includes automated exposure control, adjustment of the mA and/or kV according to patient size and/or use of iterative reconstruction technique. COMPARISON:  08/12/2022 FINDINGS: Brain: No acute infarct or hemorrhage. Lateral ventricles and midline structures are unremarkable. No acute extra-axial fluid collections. No mass effect. Vascular: No hyperdense vessel or unexpected calcification. Skull: Normal. Negative for fracture or focal lesion. Sinuses/Orbits: No acute finding. Other: None. IMPRESSION: 1. No acute intracranial process. Electronically Signed   By:  Sharlet Salina M.D.   On: 06/29/2023 19:31   DG Chest 2 View  Result Date: 06/29/2023 CLINICAL DATA:  Chest pain. EXAM: CHEST - 2 VIEW COMPARISON:  June 02, 2023. FINDINGS: The heart size and mediastinal contours are within normal limits. Both lungs are clear. The visualized skeletal structures are unremarkable. IMPRESSION: No active cardiopulmonary disease. Electronically Signed   By: Lupita Raider M.D.   On: 06/29/2023 17:13    Cardiac Studies   CARDIAC CATH: pending  CT CHEST PE PROTOCOL: 06/30/2023 IMPRESSION: 1. Negative for pulmonary embolus. 2. Calcified coronary artery atherosclerosis. 3. Substantially decreased gastric hiatal hernia since a February CTA. Underlying chronic postoperative changes to the stomach, probably gastric sleeve.  ECHO: 06/24/2023 (done as outpt)  1. Left ventricular ejection fraction, by estimation, is 60 to 65%. The left ventricle has normal function. The left ventricle has no regional wall motion abnormalities. Left ventricular diastolic parameters are consistent with Grade II diastolic  dysfunction (pseudonormalization).   2. Right ventricular systolic function is normal. The right ventricular size is normal. Tricuspid regurgitation signal is inadequate for assessing PA pressure.  3. The mitral valve is grossly normal. Trivial mitral valve  regurgitation.   4. The aortic valve is tricuspid. Aortic valve regurgitation is not  visualized. No aortic stenosis is present. Aortic valve mean gradient measures 4.0 mmHg.   5. The inferior vena cava is normal in size with greater than 50% respiratory variability, suggesting right atrial pressure of 3 mmHg.   Comparison(s): Prior images reviewed side by side. LVEF normal range at 60-65% with moderate diastolic dysfunction.   Patient Profile     52 y.o. female with a hx of CAD by CCTA, hypertension, hyperlipidemia, s/p laparoscopic sleeve gastric surgery, and recent hiatal hernia surgery, prior DVT not on  anticoagulation, was admitted 11/20 with chest pain, Cards asked to see.  Assessment & Plan    Chest pain: - Sx are somewhat pleuritic, ez neg - ECG w/out ST changes - however, cardiac CT 10/20/2022 had 50-69% prox LAD dz, other vessels w/ milder dz. Calcium score 287 - also had  heavy Ca++ seen on CT >> Cath today to eval for CAD - if no PCI needed, consider eval for microvascular dz  2. Chronic pain and other issues, per IM     For questions or updates, please contact [facility] Please consult [email or web address] for contact info under        Signed, Theodore Demark, PA-C  07/01/2023, 9:40 AM    Patient seen and examined with RB PA-C.  Agree as above, with the following exceptions and changes as noted below. Feels well resting in bed with no current chest pain. Gen: NAD, CV: RRR, no murmurs, Lungs: clear, Abd: soft, Extrem: Warm, well perfused, no edema, Neuro/Psych: alert and oriented x 3, normal mood and affect. All available labs, radiology testing, previous records reviewed. Plan for LHC today, patient preference given moderate disease on CCTA though FFR negative. She feels CP is relatively new pain for her and exertional in nature.   Parke Poisson, MD 07/01/23 11:24 AM

## 2023-07-01 NOTE — Telephone Encounter (Signed)
Patient Product/process development scientist completed.    The patient is insured through [facility]. Patient has [employer], may use a copay card, and/or apply for patient assistance if available.    Ran test claim for Brilinta 90 mg and the current 30 day co-pay is $0.00.   This test claim was processed through [facility]- copay amounts may vary at other pharmacies due to pharmacy/plan contracts, or as the patient moves through the different stages of their insurance plan.     Roland Earl, CPHT Pharmacy Technician III Certified Patient Advocate [facility] Patient Advocate Team Direct Number: [phone]  Fax: [phone]

## 2023-07-01 NOTE — Progress Notes (Signed)
Casey [name]  RUE:[id number] DOB: [birth year] DOA: 06/29/2023 PCP: Olive Bass, FNP    Brief Narrative:  52 year old with a history of Crohn's disease, HLD, HTN, RA on a biologic agent, DM2, complex hiatal hernia status post gastric sleeve, and upper extremity DVT who presented to the ER 11/20 with a 2-day history of progressively worsening chest pain which is worse with exertion, pressure-like in nature, and located in the substernal region.  CTa of the chest was negative for pulmonary embolism.   Of note she underwent coronary CT angiogram ~May 2024 which revealed moderate stenosis of the proximal LAD at which time Plavix (ASA allergy) and atorvastatin were initiated.  A TTE at the same time revealed a preserved EF.  Goals of Care:   Code Status: Full Code   DVT prophylaxis: enoxaparin (LOVENOX) injection 40 mg Start: 06/30/23 1000  Interim Hx: Afebrile.  Vital signs stable.  Resting comfortably postcardiac cath.  No new complaints.  Denies shortness of breath or current chest pain.  Assessment & Plan:  Chest pain with known CAD Cardiac cath 11/22 noted proximal LAD to mid LAD lesion with 85% stenosis which was treated with deployment of a DES -plan is to complete 6 months of Brilinta and then transition back to Plavix alone as patient is reportedly allergic to aspirin -EF 55-65% on cardiac cath  Severe persistent asthma Followed by Dr[facility] Judeth Horn [facility] pulmonary -treated with albuterol nebulizer with treatment options limited by multiple drug allergies -quiescent presently  HLD Continue usual medical therapy  Rheumatoid arthritis No change in usual medical therapy  HTN Continue usual home BP meds -blood pressure well-controlled  DM2 CBG well-controlled at present  Family Communication: No family present at time of exam Disposition: Anticipate discharge home 11/23   Objective: Blood pressure (!) 118/57, pulse (!) 58, temperature 97.7 F (36.5 C),  temperature source Oral, resp. rate 13, height 5\' 3"  (1.6 m), weight 100.8 kg, SpO2 94%. No intake or output data in the 24 hours ending 07/01/23 0925 Filed Weights   06/29/23 1456 06/29/23 2137  Weight: 101.6 kg 100.8 kg    Examination: General: No acute respiratory distress Lungs: Clear to auscultation bilaterally without wheezes or crackles Cardiovascular: Regular rate and rhythm  Abdomen: NT/ND, soft, BS positive Extremities: No significant cyanosis, clubbing, or edema bilateral lower extremities  CBC: Recent Labs  Lab 06/29/23 1342 07/01/23 0507  WBC 21.3* 9.9  HGB 14.0 12.2  HCT 45.5 38.1  MCV 82.4 81.2  PLT 417* 287   Basic Metabolic Panel: Recent Labs  Lab 06/29/23 1342  NA 139  K 4.6  CL 105  CO2 23  GLUCOSE 127*  BUN 10  CREATININE 0.85  CALCIUM 9.8   GFR: Estimated Creatinine Clearance: 85.8 mL/min (by C-G formula based on SCr of 0.85 mg/dL).   Scheduled Meds:  amLODipine  5 mg Oral Daily   atorvastatin  80 mg Oral Daily   B-complex with vitamin C  1 tablet Oral Daily   cholecalciferol  2,000 Units Oral Daily   clopidogrel  75 mg Oral Daily   enoxaparin (LOVENOX) injection  40 mg Subcutaneous Q24H   famotidine  40 mg Oral Daily   linaclotide  72 mcg Oral QAC breakfast   loratadine  10 mg Oral QPM   mirabegron ER  50 mg Oral Daily   montelukast  10 mg Oral Daily      LOS: 0 days   Lonia Blood, MD [facility] Office  [phone] Pager -  Text Page per Loretha Stapler  If 7PM-7AM, please contact night-coverage per Amion 07/01/2023, 9:25 AM

## 2023-07-01 NOTE — Interval H&P Note (Signed)
History and Physical Interval Note:  07/01/2023 12:20 PM  Casey [name]  has presented today for surgery, with the diagnosis of angina.  The various methods of treatment have been discussed with the patient and family. After consideration of risks, benefits and other options for treatment, the patient has consented to  Procedure(s): LEFT HEART CATH AND CORONARY ANGIOGRAPHY (N/A) as a surgical intervention.  The patient's history has been reviewed, patient examined, no change in status, stable for surgery.  I have reviewed the patient's chart and labs.  Questions were answered to the patient's satisfaction.     Lorine Bears

## 2023-07-02 ENCOUNTER — Other Ambulatory Visit (HOSPITAL_COMMUNITY): Payer: Self-pay

## 2023-07-02 DIAGNOSIS — Z9861 Coronary angioplasty status: Secondary | ICD-10-CM

## 2023-07-02 DIAGNOSIS — Z886 Allergy status to analgesic agent status: Secondary | ICD-10-CM | POA: Diagnosis not present

## 2023-07-02 DIAGNOSIS — R079 Chest pain, unspecified: Secondary | ICD-10-CM | POA: Diagnosis not present

## 2023-07-02 DIAGNOSIS — I2510 Atherosclerotic heart disease of native coronary artery without angina pectoris: Secondary | ICD-10-CM | POA: Diagnosis not present

## 2023-07-02 DIAGNOSIS — I10 Essential (primary) hypertension: Secondary | ICD-10-CM | POA: Diagnosis not present

## 2023-07-02 LAB — CBC
HCT: 37.7 % (ref 36.0–46.0)
Hemoglobin: 11.9 g/dL — ABNORMAL LOW (ref 12.0–15.0)
MCH: 25.5 pg — ABNORMAL LOW (ref 26.0–34.0)
MCHC: 31.6 g/dL (ref 30.0–36.0)
MCV: 80.9 fL (ref 80.0–100.0)
Platelets: 303 10*3/uL (ref 150–400)
RBC: 4.66 MIL/uL (ref 3.87–5.11)
RDW: 16.5 % — ABNORMAL HIGH (ref 11.5–15.5)
WBC: 10.5 10*3/uL (ref 4.0–10.5)
nRBC: 0 % (ref 0.0–0.2)

## 2023-07-02 LAB — BASIC METABOLIC PANEL
Anion gap: 9 (ref 5–15)
BUN: 8 mg/dL (ref 6–20)
CO2: 24 mmol/L (ref 22–32)
Calcium: 9.1 mg/dL (ref 8.9–10.3)
Chloride: 104 mmol/L (ref 98–111)
Creatinine, Ser: 0.83 mg/dL (ref 0.44–1.00)
GFR, Estimated: >60 mL/min (ref >60)
Glucose, Bld: 129 mg/dL — ABNORMAL HIGH (ref 70–99)
Potassium: 3.7 mmol/L (ref 3.5–5.1)
Sodium: 137 mmol/L (ref 135–145)

## 2023-07-02 MED ORDER — AMLODIPINE BESYLATE 5 MG PO TABS
5.0000 mg | ORAL_TABLET | Freq: Every day | ORAL | 2 refills | Status: DC
  Filled 2023-07-02: qty 30, 30d supply, fill #0

## 2023-07-02 MED ORDER — ATORVASTATIN CALCIUM 80 MG PO TABS
80.0000 mg | ORAL_TABLET | Freq: Every day | ORAL | 2 refills | Status: DC
  Filled 2023-07-02: qty 30, 30d supply, fill #0

## 2023-07-02 NOTE — TOC Transition Note (Signed)
Transition of Care [facility]) - CM/SW Discharge Note   Patient Details  Name: Casey [name] MRN: [id number] Date of Birth: [birth year]  [facility] CM/SW Contact:  Ronny Bacon, RN Phone Number: 07/02/2023, 12:40 PM   Clinical Narrative:    Patient is being discharged today. Secure chat received from floor nurse that patient is unable to find a ride home. Cab voucher given to Licensed conveyancer.     Final next level of care: Home/Self Care Barriers to Discharge: No Barriers Identified   Patient Goals and CMS Choice      Discharge Placement                         Discharge Plan and Services Additional resources added to the After Visit Summary for                                       Social Determinants of Health (SDOH) Interventions SDOH Screenings   Food Insecurity: No Food Insecurity (06/29/2023)  Housing: Low Risk  (06/29/2023)  Transportation Needs: No Transportation Needs (06/29/2023)  Utilities: Not At Risk (06/29/2023)  Depression (PHQ2-9): High Risk (02/01/2023)  Financial Resource Strain: Medium Risk (10/01/2022)   Received from [facility], [facility]  Physical Activity: Unknown (10/01/2022)   Received from [facility], [facility]  Social Connections: Somewhat Isolated (10/01/2022)   Received from [facility], [facility]  Stress: No Stress Concern Present (05/16/2023)   Received from [facility]  Tobacco Use: Low Risk  (06/29/2023)     Readmission Risk Interventions     No data to display

## 2023-07-02 NOTE — Discharge Summary (Signed)
DISCHARGE SUMMARY  Casey [name]  MR#: [id number]  DOB:[birth year]  Date of Admission: 06/29/2023 Date of Discharge: 07/02/2023  Attending Physician:Nikholas Geffre Silvestre Gunner, MD  Patient's WUJ:[id number], Allyne Gee, FNP  Disposition: Discharge home  Follow-up Appts:  Follow-up Information     Olive Bass, FNP Follow up in 1 week(s).   Specialty: Internal Medicine Contact information: [address] [place] [address] [phone]         Your Cardiologist Follow up on 07/04/2023.   Why: Keep your previously scheduled follow up with your Cardiologist.                 Discharge Diagnoses: CAD - 85% stenosis LAD s/p DES Severe persistent asthma HLD Rheumatoid arthritis HTN DM2  Initial presentation: 52 year old with a history of Crohn's disease, HLD, HTN, RA on a biologic agent, DM2, complex hiatal hernia status post gastric sleeve, and upper extremity DVT who presented to the ER 11/20 with a 2-day history of progressively worsening chest pain which is worse with exertion, pressure-like in nature, and located in the substernal region.  CTa of the chest was negative for pulmonary embolism.    Of note she underwent coronary CT angiogram ~May 2024 which revealed moderate stenosis of the proximal LAD at which time Plavix (ASA allergy) and atorvastatin were initiated.  A TTE at the same time revealed a preserved EF.  Hospital Course:  Chest pain with known CAD Cardiac cath 11/22 noted proximal LAD to mid LAD lesion with 85% stenosis which was treated with deployment of a DES - plan is to complete 6 months of Brilinta and then transition back to Plavix alone as patient is reportedly allergic to aspirin - EF 55-65% on cardiac cath - no pain at time of d/c    Severe persistent asthma Followed by Dr. Judeth Horn at [facility] pulmonary -treated with albuterol nebulizer with treatment options limited by multiple drug allergies -quiescent  presently   HLD Continue usual medical therapy   Rheumatoid arthritis No change in usual medical therapy   HTN Continue usual home BP meds -blood pressure well-controlled   DM2 CBG well-controlled at present  Allergies as of 07/02/2023       Reactions   Albuterol Sulfate Shortness Of Breath   Other reaction(s): Other (See Comments)  can't breathe Other reaction(s): Other (See Comments) can't breathe   Tape    Other reaction(s): Other (See Comments), Other (See Comments), Other (See Comments)  severe skin breakdown  Cast and Bandage Cover  severe skin breakdown  severe skin breakdown   Formoterol Other (See Comments)   Other reaction(s): Headaches  Other reaction(s): Headaches Other reaction(s): Headaches Headaches Other reaction(s): Headaches Other reaction(s): Headaches    Other reaction(s): Headaches    Headaches   Hydrocodone Rash   Hydrocodone-acetaminophen Rash   Nickel Rash, Other (See Comments)   Other Rash   cast and bandage use cast and bandage use  cast and bandage use cast and bandage use    cast and bandage use cast and bandage use   Oxycodone-acetaminophen Other (See Comments)   headache   Salmeterol Rash   Strawberry Extract Hives   Full body rash from strawberry   Advair Hfa [fluticasone-salmeterol]    unknown   Proventil Hfa [albuterol]    unknown   Asa [aspirin] Rash   Cleocin [clindamycin] Rash   Clindamycin/lincomycin Rash   Fludrocortisone Acetate Rash   Gabapentin Rash   Hydrochlorothiazide Rash   Imipramine Rash  Ipratropium Bromide Rash   Medroxyprogesterone Rash   Meloxicam Rash   Metformin And Related Rash   Nystatin Rash   Penicillins Rash   Pred Forte [prednisolone] Rash   Prednisone Rash   Solu-medrol [methylprednisolone] Rash   Sulfa Antibiotics Rash   Tegaderm Ag Mesh [silver] Rash   Toradol [ketorolac Tromethamine] Rash   Tramadol Rash        Medication List     STOP taking these medications     clopidogrel 75 MG tablet Commonly known as: PLAVIX       TAKE these medications    acetaminophen 500 MG tablet Commonly known as: TYLENOL Take 1,000 mg by mouth every 12 (twelve) hours as needed for mild pain or moderate pain.   albuterol (2.5 MG/3ML) 0.083% nebulizer solution Commonly known as: PROVENTIL Take 3 mLs (2.5 mg total) by nebulization every 6 (six) hours as needed for wheezing or shortness of breath.   amLODipine 5 MG tablet Commonly known as: NORVASC Take 1 tablet (5 mg total) by mouth daily. Start taking on: July 03, 2023   atorvastatin 80 MG tablet Commonly known as: LIPITOR Take 1 tablet (80 mg total) by mouth daily. Start taking on: July 03, 2023 What changed:  medication strength how much to take   B-complex with vitamin C tablet Take 1 tablet by mouth daily.   Brilinta 90 MG Tabs tablet Generic drug: ticagrelor Take 1 tablet (90 mg total) by mouth 2 (two) times daily.   EPINEPHrine 0.3 mg/0.3 mL Soaj injection Commonly known as: EpiPen 2-Pak Inject 0.3 mg into the muscle as needed for anaphylaxis.   esomeprazole 40 MG capsule Commonly known as: NEXIUM Take 1 capsule (40 mg total) by mouth 2 (two) times daily before a meal. What changed: when to take this   famotidine 40 MG tablet Commonly known as: PEPCID Take 1 tablet (40 mg total) by mouth daily.   levocetirizine 5 MG tablet Commonly known as: XYZAL Take 10 mg by mouth every evening.   linaclotide 72 MCG capsule Commonly known as: LINZESS Take 72 mcg by mouth daily before breakfast.   mirabegron ER 50 MG Tb24 tablet Commonly known as: Myrbetriq Take 1 tablet (50 mg total) by mouth daily.   montelukast 10 MG tablet Commonly known as: SINGULAIR Take 1 tablet (10 mg total) by mouth at bedtime. What changed: when to take this   nitroGLYCERIN 0.4 MG SL tablet Commonly known as: NITROSTAT Place 1 tablet (0.4 mg total) under the tongue every 5 (five) minutes as needed for  chest pain.   OXYGEN Inhale 2 L into the lungs at bedtime.   SIMPONI ARIA IV Inject 200 mg into the vein every 8 (eight) weeks.   Vitamin D3 50 MCG (2000 UT) Tabs Take 1 tablet by mouth daily.   ZOLMitriptan 2.5 MG tablet Commonly known as: Zomig Take 1 tablet (2.5 mg total) by mouth once for 1 dose. May repeat in 2 hours if headache persists or recurs. What changed:  when to take this reasons to take this additional instructions        Day of Discharge BP 125/86 (BP Location: Left Arm)   Pulse 65   Temp 98.6 F (37 C) (Oral)   Resp 17   Ht 5\' 3"  (1.6 m)   Wt 100.8 kg   SpO2 96%   BMI 39.38 kg/m   Physical Exam: General: No acute respiratory distress Lungs: Clear to auscultation bilaterally without wheezes or crackles Cardiovascular: Regular rate and  rhythm without murmur gallop or rub normal S1 and S2 Abdomen: Nontender, nondistended, soft, bowel sounds positive, no rebound, no ascites, no appreciable mass Extremities: No significant cyanosis, clubbing, or edema bilateral lower extremities  Basic Metabolic Panel: Recent Labs  Lab 06/29/23 1342 07/02/23 0321  NA 139 137  K 4.6 3.7  CL 105 104  CO2 23 24  GLUCOSE 127* 129*  BUN 10 8  CREATININE 0.85 0.83  CALCIUM 9.8 9.1    CBC: Recent Labs  Lab 06/29/23 1342 07/01/23 0507 07/02/23 0321  WBC 21.3* 9.9 10.5  HGB 14.0 12.2 11.9*  HCT 45.5 38.1 37.7  MCV 82.4 81.2 80.9  PLT 417* 287 303    Time spent in discharge (includes decision making & examination of pt): 35 minutes  07/02/2023, 12:00 PM   Lonia Blood, MD [facility] Office  [phone]

## 2023-07-02 NOTE — Progress Notes (Signed)
Patient verbalized understanding of dc instructions. Toc meds and dc papers , and belongings given to patient.

## 2023-07-02 NOTE — Progress Notes (Signed)
CARDIAC REHAB PHASE I   PRE:  Rate/Rhythm: 78 SR  BP:  Sitting: 154/96  MODE:  Ambulation: 470 ft   POST:  Rate/Rhythm: 83 SR  BP:  Sitting: 155/113, recheck: 147/93     Pt amb in hallway independently with standby assist, tolerated well. Reported some lightheadedness when returning to room at end of walk, resolved once seated back on bed. Initial BP 155/113 sitting on EOB, recheck 147/93. Pt educated on risk factors, exercise guidelines, angina/NTG, stent, nutrition, and CRP2. Pt lives in [place] but referral placed to [facility] due to pt preference. All questions answered, pt left EOB with call bell in reach. [phone]   Jonna Coup, MS, ACSM-CEP 07/02/2023 10:04 AM

## 2023-07-02 NOTE — Progress Notes (Signed)
Progress Note  Patient Name: Casey [name] Date of Encounter: 07/02/2023  Primary Cardiologist: Gypsy Balsam, MD  Subjective   No acute events overnight, no chest pains.  Inpatient Medications    Scheduled Meds:  amLODipine  5 mg Oral Daily   atorvastatin  80 mg Oral Daily   B-complex with vitamin C  1 tablet Oral Daily   cholecalciferol  2,000 Units Oral Daily   enoxaparin (LOVENOX) injection  40 mg Subcutaneous Q24H   famotidine  40 mg Oral Daily   linaclotide  72 mcg Oral QAC breakfast   loratadine  10 mg Oral QPM   mirabegron ER  50 mg Oral Daily   montelukast  10 mg Oral Daily   sodium chloride flush  3 mL Intravenous Q12H   ticagrelor  90 mg Oral BID   Continuous Infusions:  sodium chloride     PRN Meds: sodium chloride, acetaminophen, albuterol, nitroGLYCERIN, ondansetron (ZOFRAN) IV, sodium chloride flush   Vital Signs    Vitals:   07/01/23 1359 07/01/23 1638 07/01/23 1938 07/02/23 0322  BP: 113/87  128/64 125/86  Pulse: 72  70 65  Resp: 20  15 17   Temp:   97.9 F (36.6 C) 98.6 F (37 C)  TempSrc:  Oral Oral Oral  SpO2: 99%  96%   Weight:      Height:        Intake/Output Summary (Last 24 hours) at 07/02/2023 1013 Last data filed at 07/02/2023 0800 Gross per 24 hour  Intake 586.61 ml  Output --  Net 586.61 ml   Filed Weights   06/29/23 1456 06/29/23 2137  Weight: 101.6 kg 100.8 kg    Telemetry     Personally reviewed, NSR, no alarms  ECG    Not performed today  Physical Exam   GEN: No acute distress.   Neck: No JVD. Cardiac: RRR, no murmur, rub, or gallop.  Respiratory: Nonlabored. Clear to auscultation bilaterally. GI: Soft, nontender, bowel sounds present. MS: No edema; No deformity. Neuro:  Nonfocal. Psych: Alert and oriented x 3. Normal affect.  Labs    Chemistry Recent Labs  Lab 06/29/23 1342 07/02/23 0321  NA 139 137  K 4.6 3.7  CL 105 104  CO2 23 24  GLUCOSE 127* 129*  BUN 10 8  CREATININE 0.85 0.83   CALCIUM 9.8 9.1  PROT 8.6*  --   ALBUMIN 3.7  --   AST 26  --   ALT 21  --   ALKPHOS 86  --   BILITOT 0.6  --   GFRNONAA >60 >60  ANIONGAP 11 9     Hematology Recent Labs  Lab 06/29/23 1342 07/01/23 0507 07/02/23 0321  WBC 21.3* 9.9 10.5  RBC 5.52* 4.69 4.66  HGB 14.0 12.2 11.9*  HCT 45.5 38.1 37.7  MCV 82.4 81.2 80.9  MCH 25.4* 26.0 25.5*  MCHC 30.8 32.0 31.6  RDW 16.2* 16.6* 16.5*  PLT 417* 287 303    Cardiac Enzymes Recent Labs  Lab 06/29/23 1342 06/29/23 1539  TROPONINIHS 6 6    BNPNo results for input(s): "BNP", "PROBNP" in the last 168 hours.   DDimer Recent Labs  Lab 06/29/23 1539  DDIMER 0.44     Radiology    CARDIAC CATHETERIZATION  Result Date: 07/01/2023   Prox LAD to Mid LAD lesion is 85% stenosed.   A drug-eluting stent was successfully placed using a SYNERGY XD 3.0X20.   Post intervention, there is a 0% residual stenosis.  The left ventricular systolic function is normal.   LV end diastolic pressure is mildly elevated.   The left ventricular ejection fraction is 55-65% by visual estimate. 1.  Severe one-vessel coronary artery disease with 85% stenosis in the proximal/mid LAD shortly after the origin of a large first diagonal.  The area is hazy and highly suggestive of plaque rupture. 2.  Normal LV systolic function and mildly elevated left ventricular end-diastolic pressure at 13 mmHg. 3.  Successful drug-eluting stent placement to the proximal/mid LAD. Recommendations: The patient is allergic to aspirin.  Thus, we will switch from clopidogrel to Brilinta which should be continued for a minimum of 6 months.  After 6 months, she can be switched back to clopidogrel. Recommend aggressive treatment of risk factors.     Assessment & Plan   SIHD s/p proximal to mid LAD PCI on 07/01/2023 Aspirin allergy HTN, partially controlled Chronic pain   -Presented with exertional chest pain, negative troponins and normal EKG. Coronary CT with moderate CAD  in the proximal LAD and negative FFR.  Underwent LHC that showed 85% proximal to mid LAD lesion, s/p successful PCI.  Right radial access site is intact, no bleeding and normal strength.  Has aspirin allergy, currently on Brilinta 90 mg twice daily to be continued for duration of 6 months.  After 6 months, Brilinta will need to be switched to Plavix monotherapy.  Echocardiogram in 2024 showed normal LVEF and no valvular heart disease.  Telemetry reviewed, NSR, no alarms.  Patient walked in the hallway with no symptoms other than palpitations.  Resting HR 80 to 90s due to chronic pain and mildly elevated BP, 140 mmHg SBP, will start carvedilol 3.125 mg twice daily and continue amlodipine 5 mg once daily.  She is stable to go home today.   Signed, Marjo Bicker, MD  07/02/2023, 10:13 AM

## 2023-07-02 NOTE — Plan of Care (Signed)
Problem: Education: Goal: Understanding of cardiac disease, CV risk reduction, and recovery process will improve 07/02/2023 1212 by Juluis Mire, RN Outcome: Adequate for Discharge 07/02/2023 1212 by Juluis Mire, RN Outcome: Progressing Goal: Individualized Educational Video(s) 07/02/2023 1212 by Juluis Mire, RN Outcome: Adequate for Discharge 07/02/2023 1212 by Juluis Mire, RN Outcome: Progressing   Problem: Activity: Goal: Ability to tolerate increased activity will improve 07/02/2023 1212 by Juluis Mire, RN Outcome: Adequate for Discharge 07/02/2023 1212 by Juluis Mire, RN Outcome: Progressing   Problem: Cardiac: Goal: Ability to achieve and maintain adequate cardiovascular perfusion will improve 07/02/2023 1212 by Juluis Mire, RN Outcome: Adequate for Discharge 07/02/2023 1212 by Juluis Mire, RN Outcome: Progressing   Problem: Health Behavior/Discharge Planning: Goal: Ability to safely manage health-related needs after discharge will improve 07/02/2023 1212 by Juluis Mire, RN Outcome: Adequate for Discharge 07/02/2023 1212 by Juluis Mire, RN Outcome: Progressing   Problem: Education: Goal: Knowledge of General Education information will improve Description: Including pain rating scale, medication(s)/side effects and non-pharmacologic comfort measures 07/02/2023 1212 by Juluis Mire, RN Outcome: Adequate for Discharge 07/02/2023 1212 by Juluis Mire, RN Outcome: Progressing   Problem: Health Behavior/Discharge Planning: Goal: Ability to manage health-related needs will improve 07/02/2023 1212 by Juluis Mire, RN Outcome: Adequate for Discharge 07/02/2023 1212 by Juluis Mire, RN Outcome: Progressing   Problem: Clinical Measurements: Goal: Ability to maintain clinical measurements within normal limits will improve 07/02/2023 1212 by Juluis Mire,  RN Outcome: Adequate for Discharge 07/02/2023 1212 by Juluis Mire, RN Outcome: Progressing Goal: Will remain free from infection 07/02/2023 1212 by Juluis Mire, RN Outcome: Adequate for Discharge 07/02/2023 1212 by Juluis Mire, RN Outcome: Progressing Goal: Diagnostic test results will improve 07/02/2023 1212 by Juluis Mire, RN Outcome: Adequate for Discharge 07/02/2023 1212 by Juluis Mire, RN Outcome: Progressing Goal: Respiratory complications will improve 07/02/2023 1212 by Juluis Mire, RN Outcome: Adequate for Discharge 07/02/2023 1212 by Juluis Mire, RN Outcome: Progressing Goal: Cardiovascular complication will be avoided 07/02/2023 1212 by Juluis Mire, RN Outcome: Adequate for Discharge 07/02/2023 1212 by Juluis Mire, RN Outcome: Progressing   Problem: Activity: Goal: Risk for activity intolerance will decrease 07/02/2023 1212 by Juluis Mire, RN Outcome: Adequate for Discharge 07/02/2023 1212 by Juluis Mire, RN Outcome: Progressing   Problem: Nutrition: Goal: Adequate nutrition will be maintained 07/02/2023 1212 by Juluis Mire, RN Outcome: Adequate for Discharge 07/02/2023 1212 by Juluis Mire, RN Outcome: Progressing   Problem: Coping: Goal: Level of anxiety will decrease 07/02/2023 1212 by Juluis Mire, RN Outcome: Adequate for Discharge 07/02/2023 1212 by Juluis Mire, RN Outcome: Progressing   Problem: Elimination: Goal: Will not experience complications related to bowel motility 07/02/2023 1212 by Juluis Mire, RN Outcome: Adequate for Discharge 07/02/2023 1212 by Juluis Mire, RN Outcome: Progressing Goal: Will not experience complications related to urinary retention 07/02/2023 1212 by Juluis Mire, RN Outcome: Adequate for Discharge 07/02/2023 1212 by Juluis Mire, RN Outcome: Progressing   Problem:  Pain Management: Goal: General experience of comfort will improve 07/02/2023 1212 by Juluis Mire, RN Outcome: Adequate for Discharge 07/02/2023 1212 by Juluis Mire, RN Outcome: Progressing   Problem: Safety: Goal: Ability to remain free from injury will improve 07/02/2023 1212 by Juluis Mire, RN Outcome: Adequate for Discharge 07/02/2023 1212 by Juluis Mire, RN Outcome: Progressing  Problem: Skin Integrity: Goal: Risk for impaired skin integrity will decrease 07/02/2023 1212 by Juluis Mire, RN Outcome: Adequate for Discharge 07/02/2023 1212 by Juluis Mire, RN Outcome: Progressing   Problem: Education: Goal: Understanding of CV disease, CV risk reduction, and recovery process will improve 07/02/2023 1212 by Juluis Mire, RN Outcome: Adequate for Discharge 07/02/2023 1212 by Juluis Mire, RN Outcome: Progressing Goal: Individualized Educational Video(s) 07/02/2023 1212 by Juluis Mire, RN Outcome: Adequate for Discharge 07/02/2023 1212 by Juluis Mire, RN Outcome: Progressing   Problem: Activity: Goal: Ability to return to baseline activity level will improve 07/02/2023 1212 by Juluis Mire, RN Outcome: Adequate for Discharge 07/02/2023 1212 by Juluis Mire, RN Outcome: Progressing   Problem: Cardiovascular: Goal: Ability to achieve and maintain adequate cardiovascular perfusion will improve 07/02/2023 1212 by Juluis Mire, RN Outcome: Adequate for Discharge 07/02/2023 1212 by Juluis Mire, RN Outcome: Progressing Goal: Vascular access site(s) Level 0-1 will be maintained 07/02/2023 1212 by Juluis Mire, RN Outcome: Adequate for Discharge 07/02/2023 1212 by Juluis Mire, RN Outcome: Progressing   Problem: Health Behavior/Discharge Planning: Goal: Ability to safely manage health-related needs after discharge will improve 07/02/2023 1212 by Juluis Mire, RN Outcome: Adequate for Discharge 07/02/2023 1212 by Juluis Mire, RN Outcome: Progressing

## 2023-07-03 NOTE — Progress Notes (Addendum)
 " Cardiology Office Note:  .   Date:  07/04/2023  ID:  Casey [name], DOB 14-Dec-1970, MRN [id number] PCP: Jason Leita Repine, FNP  [facility] Providers Cardiologist:  Lamar Fitch, MD    History of Present Illness: .   Casey [name] is a 52 y.o. female with a past medical history of hypertension, migraines, CAD s/p DES LAD in 2024, GERD, rheumatoid arthritis, OAB, dyslipidemia, history of DVT, Crohn's disease.  07/01/2023 left heart cath severe one-vessel CAD with 85% stenosis in the proximal/mid LAD s/p DES--continue Brilinta  for 6 months >> Plavix  that she has allergic to aspirin  06/24/2023 echo EF 60 to 65%, grade 2 DD, trivial MR 10/14/2022 coronary CTA calcium  score 287, 99th percentile, moderate stenosis in proximal LAD FFR revealed low likelihood of hemodynamic significance  She was admitted to [facility] 06/29/2023 to 07/02/2023 after presenting with a 2-day history of progressive chest pain.  She underwent left heart cath on 07/01/2023 with DES to her LAD.  LDL on 06/13/2023 was elevated at 94, her Lipitor was increased to 40 mg.  LPA was checked however has not resulted yet.  She presents today for follow-up after hospitalization as outlined above.  She is feeling okay from a cardiac perspective.  She has been bothered by some shortness of breath, discussed that this could be related to Brilinta  use.  She is looking forward to participating in cardiac rehab-does she previously participated in pulmonary rehab and felt this was very beneficial for her overall health. She denies chest pain, palpitations, dyspnea, pnd, orthopnea, n, v, dizziness, syncope, edema, weight gain, or early satiety.   ROS: Review of Systems  Respiratory:  Positive for shortness of breath.   Musculoskeletal:  Positive for joint pain.  All other systems reviewed and are negative.    Studies Reviewed: .        Cardiac Studies & Procedures   CARDIAC CATHETERIZATION  CARDIAC  CATHETERIZATION 07/01/2023  Narrative   Prox LAD to Mid LAD lesion is 85% stenosed.   A drug-eluting stent was successfully placed using a SYNERGY XD 3.0X20.   Post intervention, there is a 0% residual stenosis.   The left ventricular systolic function is normal.   LV end diastolic pressure is mildly elevated.   The left ventricular ejection fraction is 55-65% by visual estimate.  1.  Severe one-vessel coronary artery disease with 85% stenosis in the proximal/mid LAD shortly after the origin of a large first diagonal.  The area is hazy and highly suggestive of plaque rupture. 2.  Normal LV systolic function and mildly elevated left ventricular end-diastolic pressure at 13 mmHg. 3.  Successful drug-eluting stent placement to the proximal/mid LAD.  Recommendations: The patient is allergic to aspirin .  Thus, we will switch from clopidogrel  to Brilinta  which should be continued for a minimum of 6 months.  After 6 months, she can be switched back to clopidogrel . Recommend aggressive treatment of risk factors.  Findings Coronary Findings Diagnostic  Dominance: Right  Left Main Vessel is angiographically normal.  Left Anterior Descending Prox LAD to Mid LAD lesion is 85% stenosed. Vessel is the culprit lesion. The lesion is type C and distal to major branch. The lesion is mildly calcified. The lesion was not previously treated .  Second Diagonal Branch Vessel is small in size. Vessel is angiographically normal.  Third Diagonal Branch Vessel is small in size. Vessel is angiographically normal.  Left Circumflex The vessel exhibits minimal luminal irregularities.  First Obtuse Marginal  Branch Vessel is small in size.  Second Obtuse Marginal Branch Vessel is large in size. Vessel is angiographically normal.  Right Coronary Artery The vessel exhibits minimal luminal irregularities.  Right Posterior Descending Artery Vessel is angiographically normal.  Right Posterior  Atrioventricular Artery Vessel is angiographically normal.  Intervention  Prox LAD to Mid LAD lesion Stent Lesion length:  20 mm. CATH LAUNCHER 6FR EBU3.5 guide catheter was inserted. Unable to cross lesion with guidewire using a WIRE RUNTHROUGH .[id number]. Pre-stent angioplasty was not performed. A drug-eluting stent was successfully placed using a SYNERGY XD 3.0X20. Maximum pressure: 14 atm. Inflation time: 20 sec. Post-stent angioplasty was performed using a BALL SAPPHIRE NC24 3.25X12. Maximum pressure:  16 atm. Inflation time:  20 sec. Post-Intervention Lesion Assessment The intervention was successful. Pre-interventional TIMI flow is 3. Post-intervention TIMI flow is 3. No complications occurred at this lesion. There is a 0% residual stenosis post intervention.     ECHOCARDIOGRAM  ECHOCARDIOGRAM COMPLETE 06/24/2023  Narrative ECHOCARDIOGRAM REPORT    Patient Name:   Casey [name] Date of Exam: 06/24/2023 Medical Rec #:  [id number]    Height:       63.0 in Accession #:    [id number]   Weight:       224.6 lb Date of Birth:  04/08/1971    BSA:          2.031 m Patient Age:    52 years     BP:           118/82 mmHg Patient Gender: F            HR:           65 bpm. Exam Location:  [facility]  Procedure: 2D Echo, Cardiac Doppler and Color Doppler  Indications:    R06.9 DOE  History:        Patient has prior history of Echocardiogram examinations, most recent 10/14/2022. CAD, Chron's disease, Signs/Symptoms:Chest Pain, Dizziness/Lightheadedness and Dyspnea; Risk Factors:Morbid obesity, Hypertension, Non-Smoker, Diabetes and Dyslipidemia. UE-DVT.  Sonographer:    Bascom Burows RCS, RVS Referring Phys: [id number] LAMAR PARAS KRASOWSKI   Sonographer Comments: Global longitudinal strain was attempted. IMPRESSIONS   1. Left ventricular ejection fraction, by estimation, is 60 to 65%. The left ventricle has normal function. The left ventricle has no regional wall motion abnormalities.  Left ventricular diastolic parameters are consistent with Grade II diastolic dysfunction (pseudonormalization). 2. Right ventricular systolic function is normal. The right ventricular size is normal. Tricuspid regurgitation signal is inadequate for assessing PA pressure. 3. The mitral valve is grossly normal. Trivial mitral valve regurgitation. 4. The aortic valve is tricuspid. Aortic valve regurgitation is not visualized. No aortic stenosis is present. Aortic valve mean gradient measures 4.0 mmHg. 5. The inferior vena cava is normal in size with greater than 50% respiratory variability, suggesting right atrial pressure of 3 mmHg.  Comparison(s): Prior images reviewed side by side. LVEF normal range at 60-65% with moderate diastolic dysfunction.  FINDINGS Left Ventricle: Left ventricular ejection fraction, by estimation, is 60 to 65%. The left ventricle has normal function. The left ventricle has no regional wall motion abnormalities. Global longitudinal strain performed but not reported based on interpreter judgement due to suboptimal tracking. The left ventricular internal cavity size was normal in size. There is borderline left ventricular hypertrophy. Left ventricular diastolic parameters are consistent with Grade II diastolic dysfunction (pseudonormalization).  Right Ventricle: The right ventricular size is normal. No increase in right ventricular wall thickness. Right ventricular systolic  function is normal. Tricuspid regurgitation signal is inadequate for assessing PA pressure.  Left Atrium: Left atrial size was normal in size.  Right Atrium: Right atrial size was normal in size.  Pericardium: There is no evidence of pericardial effusion.  Mitral Valve: The mitral valve is grossly normal. Trivial mitral valve regurgitation. MV peak gradient, 3.0 mmHg. The mean mitral valve gradient is 1.0 mmHg.  Tricuspid Valve: The tricuspid valve is grossly normal. Tricuspid valve regurgitation is  trivial.  Aortic Valve: The aortic valve is tricuspid. Aortic valve regurgitation is not visualized. No aortic stenosis is present. Aortic valve mean gradient measures 4.0 mmHg. Aortic valve peak gradient measures 6.2 mmHg. Aortic valve area, by VTI measures 2.80 cm.  Pulmonic Valve: The pulmonic valve was grossly normal. Pulmonic valve regurgitation is not visualized.  Aorta: The aortic root and ascending aorta are structurally normal, with no evidence of dilitation.  Venous: The inferior vena cava is normal in size with greater than 50% respiratory variability, suggesting right atrial pressure of 3 mmHg.  IAS/Shunts: No atrial level shunt detected by color flow Doppler.   LEFT VENTRICLE PLAX 2D LVIDd:         4.30 cm     Diastology LVIDs:         2.60 cm     LV e' medial:    5.33 cm/s LV PW:         1.00 cm     LV E/e' medial:  18.3 LV IVS:        1.00 cm     LV e' lateral:   5.33 cm/s LVOT diam:     2.10 cm     LV E/e' lateral: 18.3 LV SV:         66 LV SV Index:   32 LVOT Area:     3.46 cm  LV Volumes (MOD) LV vol d, MOD A2C: 47.5 ml LV vol d, MOD A4C: 62.2 ml LV vol s, MOD A2C: 30.7 ml LV vol s, MOD A4C: 22.0 ml LV SV MOD A2C:     16.8 ml LV SV MOD A4C:     62.2 ml LV SV MOD BP:      28.9 ml  RIGHT VENTRICLE RV Basal diam:  2.60 cm RV Mid diam:    3.20 cm RV S prime:     15.70 cm/s TAPSE (M-mode): 2.7 cm  LEFT ATRIUM           Index        RIGHT ATRIUM           Index LA diam:      2.90 cm 1.43 cm/m   RA Area:     10.30 cm LA Vol (A4C): 26.3 ml 12.95 ml/m  RA Volume:   19.30 ml  9.50 ml/m AORTIC VALVE                    PULMONIC VALVE AV Area (Vmax):    2.56 cm     PV Vmax:       1.15 m/s AV Area (Vmean):   2.45 cm     PV Peak grad:  5.3 mmHg AV Area (VTI):     2.80 cm AV Vmax:           124.00 cm/s AV Vmean:          89.200 cm/s AV VTI:            0.235 m AV Peak Grad:  6.2 mmHg AV Mean Grad:      4.0 mmHg LVOT Vmax:         91.60 cm/s LVOT  Vmean:        63.100 cm/s LVOT VTI:          0.190 m LVOT/AV VTI ratio: 0.81  AORTA Ao Root diam: 3.00 cm Ao Asc diam:  3.30 cm  MITRAL VALVE MV Area (PHT): 3.02 cm    SHUNTS MV Area VTI:   2.47 cm    Systemic VTI:  0.19 m MV Peak grad:  3.0 mmHg    Systemic Diam: 2.10 cm MV Mean grad:  1.0 mmHg MV Vmax:       0.86 m/s MV Vmean:      53.8 cm/s MV Decel Time: 251 msec MV E velocity: 97.30 cm/s MV A velocity: 83.10 cm/s MV E/A ratio:  1.17  Jayson Sierras MD Electronically signed by Jayson Sierras MD Signature Date/Time: 06/24/2023/4:46:03 PM    Final             Risk Assessment/Calculations:             Physical Exam:   VS:  BP 112/78   Pulse 88   Ht 5' 3 (1.6 m)   Wt 219 lb 9.6 oz (99.6 kg)   SpO2 98%   BMI 38.90 kg/m    Wt Readings from Last 3 Encounters:  07/04/23 219 lb 9.6 oz (99.6 kg)  06/29/23 222 lb 4.8 oz (100.8 kg)  06/13/23 224 lb 9.6 oz (101.9 kg)    GEN: Well nourished, well developed in no acute distress NECK: No JVD; No carotid bruits CARDIAC: RRR, no murmurs, rubs, gallops RESPIRATORY:  Clear to auscultation without rales, wheezing or rhonchi  ABDOMEN: Soft, non-tender, non-distended EXTREMITIES:  No edema; No deformity  SKIN: Right radial left heart cath site +2 pulses distal to proximal from insertion site, ecchymosis noted with slight swelling--overall healing well  ASSESSMENT AND PLAN: .    CAD -s/p DES to proximal/mid LAD on 07/01/2023, she is allergic to aspirin  therefore she is on Brilinta  90 mg twice daily x 6 months with plans to transition to Plavix  indefinitely thereafter as she is allergic to ASA-- some SOB advised to try taking with caffeine  to see if this helps. Continue NTG PRN.  Will refer to cardiac rehab at [facility] has a referral already in the system for cardiac rehab at [facility] however she is concerned that her insurance will change soon so she wants to go with whichever system can get her started first.     HTN -pressure is well-controlled at 112/78, continue Norvasc  5 mg daily,   HLD -most recent LDL was elevated at 94, Lipitor was increased to 40 mg daily, will repeat FLP and LFTs in 6 weeks.      Cardiac Rehabilitation Eligibility Assessment  The patient is ready to start cardiac rehabilitation from a cardiac standpoint.       Dispo: Refer to cardiac rehab, FLP and LFTs in 6 weeks, return to see Dr. Bernie in 2 months.   Signed, Delon JAYSON Hoover, NP  "

## 2023-07-04 ENCOUNTER — Ambulatory Visit: Payer: 59 | Attending: Cardiology | Admitting: Cardiology

## 2023-07-04 ENCOUNTER — Telehealth: Payer: Self-pay | Admitting: Cardiovascular Disease

## 2023-07-04 ENCOUNTER — Encounter: Payer: Self-pay | Admitting: Cardiology

## 2023-07-04 VITALS — BP 112/78 | HR 88 | Ht 63.0 in | Wt 219.6 lb

## 2023-07-04 DIAGNOSIS — I10 Essential (primary) hypertension: Secondary | ICD-10-CM | POA: Diagnosis not present

## 2023-07-04 DIAGNOSIS — I2510 Atherosclerotic heart disease of native coronary artery without angina pectoris: Secondary | ICD-10-CM

## 2023-07-04 DIAGNOSIS — E782 Mixed hyperlipidemia: Secondary | ICD-10-CM

## 2023-07-04 DIAGNOSIS — K2100 Gastro-esophageal reflux disease with esophagitis, without bleeding: Secondary | ICD-10-CM

## 2023-07-04 NOTE — Telephone Encounter (Signed)
Spoke to patient she stated she had a cardiac cath this past Fri 11/22.Stated her right wrist at cath site slightly red,painful and swollen.Advised to keep appointment already scheduled with Wallis Bamberg NP this afternoon at 1:30 pm at [facility] office.

## 2023-07-04 NOTE — Telephone Encounter (Signed)
Patient states she was advised to call in post 11/22 cath if she had any bruising, and she states her wrist is swollen and bruised where she had the the stent placed. She states it's following along the path of her artery. Please advise.

## 2023-07-04 NOTE — Patient Instructions (Addendum)
Medication Instructions:  Your physician recommends that you continue on your current medications as directed. Please refer to the Current Medication list given to you today.  *If you need a refill on your cardiac medications before your next appointment, please call your pharmacy*   Lab Work: Your physician recommends that you return for lab work in: 6 Weeks for Fasting Lipid Panel and CMP, (Jan 6th) Lab opens at 8am. You DO NOT NEED an appointment. Best time to come is between 8am and 11:45 and between 1:30 and 4:30. If you have been asked to fast for your blood work please have nothing to eat or drink after midnight. You may have water.   If you have labs (blood work) drawn today and your tests are completely normal, you will receive your results only by: MyChart Message (if you have MyChart) OR A paper copy in the mail If you have any lab test that is abnormal or we need to change your treatment, we will call you to review the results.   Testing/Procedures: NONE   Follow-Up: At [facility], you and your health needs are our priority.  As part of our continuing mission to provide you with exceptional heart care, we have created designated Provider Care Teams.  These Care Teams include your primary Cardiologist (physician) and Advanced Practice Providers (APPs -  Physician Assistants and Nurse Practitioners) who all work together to provide you with the care you need, when you need it.  We recommend signing up for the patient portal called "MyChart".  Sign up information is provided on this After Visit Summary.  MyChart is used to connect with patients for Virtual Visits (Telemedicine).  Patients are able to view lab/test results, encounter notes, upcoming appointments, etc.  Non-urgent messages can be sent to your provider as well.   To learn more about what you can do with MyChart, go to [email or web address].    Your next appointment:   2 Month  Provider:   Gypsy Balsam, MD    Other Instructions  [employer] [facility] Patient Assistance and fill out form on line.

## 2023-07-05 ENCOUNTER — Telehealth: Payer: Self-pay

## 2023-07-05 LAB — CULTURE, BLOOD (ROUTINE X 2)
Culture: NO GROWTH
Culture: NO GROWTH
Special Requests: ADEQUATE

## 2023-07-05 LAB — POCT ACTIVATED CLOTTING TIME: Activated Clotting Time: 325 s

## 2023-07-05 NOTE — Transitions of Care (Post Inpatient/ED Visit) (Signed)
   07/05/2023  Name: Casey [name] MRN: [id number] DOB: 1971/05/04  Today's TOC FU Call Status: Today's TOC FU Call Status:: Unsuccessful Call (1st Attempt) Unsuccessful Call (1st Attempt) Date: 07/05/23  Attempted to reach the patient regarding the most recent Inpatient/ED visit.  Follow Up Plan: Additional outreach attempts will be made to reach the patient to complete the Transitions of Care (Post Inpatient/ED visit) call.    Britani Beattie, CMA  [facility] AW[facility] Team Direct Dial: [phone]

## 2023-07-06 ENCOUNTER — Telehealth: Payer: Self-pay

## 2023-07-06 ENCOUNTER — Other Ambulatory Visit (HOSPITAL_BASED_OUTPATIENT_CLINIC_OR_DEPARTMENT_OTHER): Payer: Self-pay

## 2023-07-06 ENCOUNTER — Ambulatory Visit (HOSPITAL_BASED_OUTPATIENT_CLINIC_OR_DEPARTMENT_OTHER)
Admission: EM | Admit: 2023-07-06 | Discharge: 2023-07-06 | Disposition: A | Discharge status: Home / Self Care | Payer: 59 | Attending: Internal Medicine | Admitting: Internal Medicine

## 2023-07-06 ENCOUNTER — Telehealth: Payer: Self-pay | Admitting: Emergency Medicine

## 2023-07-06 ENCOUNTER — Encounter (HOSPITAL_BASED_OUTPATIENT_CLINIC_OR_DEPARTMENT_OTHER): Payer: Self-pay | Admitting: Emergency Medicine

## 2023-07-06 DIAGNOSIS — H6691 Otitis media, unspecified, right ear: Secondary | ICD-10-CM | POA: Insufficient documentation

## 2023-07-06 DIAGNOSIS — B349 Viral infection, unspecified: Secondary | ICD-10-CM | POA: Insufficient documentation

## 2023-07-06 DIAGNOSIS — H6121 Impacted cerumen, right ear: Secondary | ICD-10-CM | POA: Diagnosis present

## 2023-07-06 LAB — LIPOPROTEIN A (LPA): Lipoprotein (a): 58.1 nmol/L — ABNORMAL HIGH (ref <75.0)

## 2023-07-06 MED ORDER — CEFDINIR 300 MG PO CAPS
300.0000 mg | ORAL_CAPSULE | Freq: Two times a day (BID) | ORAL | 0 refills | Status: AC
  Filled 2023-07-06: qty 14, 7d supply, fill #0

## 2023-07-06 NOTE — Telephone Encounter (Signed)
Recently in hospital for cath and stent. Had to go to Urgent care for ear infection today. Has had chest pressure and increased HR from 90-114 today. 1 Nitroglycerin eased the pressure but did not help the HR. She reported some congestion but no cough. No selling in her feet and legs and no shortness of breath reported. Please advise.

## 2023-07-06 NOTE — Telephone Encounter (Signed)
-----   Message from Gypsy Balsam sent at 06/30/2023  9:19 AM EST ----- Echocardiogram showed preserved left ventricle ejection fraction, trivial mitral valve regurgitation, everything else looks fine

## 2023-07-06 NOTE — ED Triage Notes (Signed)
Pt woke up from a nap yesterday afternoon with nasal congestion and right ear pain. Tried nasal saline gel. Reports throat is scratchy and little cough.  Tried tylenol PM.  Pt had cardiac stent last Friday so scared to take/do much.

## 2023-07-06 NOTE — Telephone Encounter (Signed)
Per Dr. Janyce Llanos note:  Echocardiogram showed preserved left ventricle ejection fraction, trivial mitral valve regurgitation, everything else looks fine . Troponin was negative.

## 2023-07-06 NOTE — Discharge Instructions (Signed)
COVID test is pending, staff will call you if this is positive. You have a right-sided ear infection. Take cefdinir antibiotic every 12 hours for the next 7 days. Tylenol as needed for pain and chills. Guaifenesin (mucinex) 600mg  every 12 hours as needed for nasal congestion/cough.  If you develop any new or worsening symptoms or if your symptoms do not start to improve, please return here or follow-up with your primary care provider. If your symptoms are severe, please go to the emergency room.

## 2023-07-06 NOTE — ED Provider Notes (Signed)
[name]    CSN: [id number] Arrival date & time: 07/06/23  0843      History   Chief Complaint Chief Complaint  Patient presents with   Nasal Congestion   Ear Pain    HPI Casey [name] is a 52 y.o. female.   Patient presents to urgent care for evaluation of right sided ear pain, nasal congestion, and slight cough that started abruptly yesterday morning.  States pain to the right ear has worsened significantly over the last 24 hours.  Denies ear ringing sensation, dizziness, drainage from the right ear, nausea, vomiting, abdominal pain, chest pain, shortness of breath, palpitations, rash.  Reports intermittent chills at home without documented fever.  No recent sick contacts with similar symptoms.  States she was in the hospital 4 to 5 days ago and had a heart cath with stent placement to the LAD while in the hospital.  Reports frequent right ear infections, states this feels similar.  Denies recent antibiotic or steroid use.  She has not attempted use of any other Medications to help with symptoms PTA.     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 Atopic dermatitis Formatting of this note might be different from the original. Formatting of this note might be different from the original. Atopic dermatitis Formatting of this note might be different from the original. Formatting of this note might be different from the original. Formatting of this note might be different from the or   Cataract 05/06/2015   Contusion of left wrist 07/08/2021   Crohn's disease (HCC) 04/21/2015   De Quervain's tenosynovitis, left 07/31/2021   Diabetes mellitus without complication (HCC)    Diabetes type 2,  controlled (HCC) 04/21/2015   Dry eyes 05/06/2015   DVT (deep venous thrombosis) (HCC)    Dysfunction of both eustachian tubes 08/31/2012   Encounter for monitoring immunomodulating therapy 11/20/2021   Environmental allergies 08/12/2020   Esophageal candidiasis (HCC) 06/26/2022   Fibrositis 08/28/2014   Formatting of this note might be different from the original. Fibromyalgia Formatting of this note might be different from the original. Formatting of this note might be different from the original. Fibromyalgia   Flank pain 11/24/2020   Gastroesophageal reflux disease with esophagitis without hemorrhage 08/01/2019   Formatting of this note might be different from the original. Formatting of this note might be different from the original. 07/2019: chronic symptoms of esophageal reflux since prior to sleeve gastrectomy surgery. Symptoms have worsened over the last 6 months with recent EGD findings of grade B esophagitis, irregular z line, erythematous mucosa, and evidence of sleeve gastrectomy. 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 Formatting of this note might be different from the original. 07/2019: 8 mm colonic polyp removed during colonoscopy, biopsy showed serrated polyp. Repeat colonoscopy recommended in one year. Formatting of this note might be different from the original. 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 Hypertension Formatting of  this note might be different from the original. Formatting of this note might be different from the original. 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 Keratoconus Formatting of this note might be different from the original. Formatting of this note might be different from the original. Keratoconus Formatting of this note might be different from the original. Formatting of this note might be different from the original. Keratoconus Formatting of this note might be different from the or   Microhematuria 06/27/2015   Migraine 08/28/2014   Formatting of this note might be different from the original. Migraine Formatting of this note might be different from the original. Formatting of this note might be different from the original. Migraine Formatting of this note might be different from the original. Migraine Formatting of this note might be different from the original. Migraine Formatting of this note might be different from the or   Migraine without aura and without status migrainosus, not intractable 08/28/2014   Formatting of this note might be different from the original. Formatting of this note might be different from the original. Formatting of this note might be different from the original. Migraine Formatting of this note might be different from the original. Migraine Formatting of this note might be different from the original. Formatting of this note might be different from the original. Migraine F   Mild intermittent asthma without complication 10/30/2008   Formatting of this note might be different from the original. Formatting of this note might be different from the original. Formatting of this note might be different from the original. Asthma Formatting of this note might be different from the original. Asthma  Formatting of this note might be different from the original. Formatting of this note might be different from the original. 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 Formatting of this note might be different from the original. 01/25/2016 with Dr. Dionisio David at [facility]. [place] of this note might be different from the original. 01/25/2016 with Dr. Dionisio David at [facility]. [place] of this note might be different from the original. Formatting of this note might be different from    Seronegative inflammatory arthritis 08/28/2014   Formatting of this note might be different from the original. Formatting of this note might be different from the original. Arthritis; diagnosed as IBD related - 2012 Formatting of this note might be different from the original. Followed by rheumatologist. Had to change providers due to an insurance change earlier  this year resulting in patient not being able to have Remicade for ~6 months. 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 Formatting of this note might be different from the original. Treated with PRN miralax. Recent colonoscopy 07/2019. Formatting of this note might be different  from the original. Treated with PRN miralax. Recent colonoscopy 07/2019. Formatting of this note might be different from the original. 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   Coronary artery disease moderate, hemodynamically insignificant proximal LAD based on coronary CT angio from 2024 12/28/2022   Precordial chest pain 10/13/2022   Hypercholesterolemia 10/07/2022   Hiatal hernia 10/07/2022   Esophageal candidiasis (HCC) 06/26/2022   Urinary tract infection, site not specified 06/26/2022   Ureteral stone 01/20/2022   Encounter for monitoring immunomodulating therapy 11/20/2021   Rheumatoid arthritis of multiple sites with 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 factor (HCC) 11/16/2021   Pyelonephritis 09/25/2021   De Quervain's tenosynovitis, left 07/31/2021   Contusion of left wrist 07/08/2021   Status post placement of ureteral stent 02/06/2021   Proteinuria 08/12/2020   Environmental allergies 08/12/2020   History of kidney stones 08/12/2020   Tooth infection 08/12/2020   Gastroesophageal reflux disease with esophagitis without hemorrhage 08/01/2019   Slow transit constipation 08/01/2019   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   History of colon polyps 06/19/2019   Irregular astigmatism of both eyes 10/11/2018   Nephrolithiasis 09/08/2018   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   Plantar wart 11/04/2016   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   Myopia with astigmatism and presbyopia 05/07/2016   Hematochezia  03/18/2016   Overactive bladder 11/18/2015   Incontinence 10/07/2015   Albuminuria 06/27/2015   Hypertension, essential, benign 06/27/2015   Microhematuria 06/27/2015   Hematuria 06/18/2015   Cataract 05/06/2015   Dry eyes 05/06/2015   Crohn's disease (HCC) 04/21/2015   Osteoarthritis of both knees 04/21/2015   Right knee pain 04/21/2015   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/30/2014   History of abnormal cervical Pap smear 11/29/2014   Fibrositis 08/28/2014   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   Migraine without aura and without status migrainosus, not intractable 08/28/2014   Migraine 08/28/2014   Seronegative inflammatory arthritis 08/28/2014   Increased urinary frequency 07/16/2013   Dysfunction of both eustachian tubes 08/31/2012   Asthma 10/30/2008   Mild intermittent asthma without complication 10/30/2008    Past Surgical History:  Procedure Laterality Date   BACK SURGERY     L5-S1   CERVICAL CONE BIOPSY  04/2000   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   CORONARY STENT INTERVENTION N/A 07/01/2023   Procedure: CORONARY STENT INTERVENTION;  Surgeon: Iran Ouch, MD;  Location: [facility] INVASIVE CV LAB;  Service: Cardiovascular;  Laterality: N/A;   EYE SURGERY     GASTRIC BYPASS  2017   Sleve   LAPAROSCOPIC GASTRIC SLEEVE RESECTION     LEFT HEART CATH AND CORONARY ANGIOGRAPHY N/A 07/01/2023   Procedure: LEFT HEART CATH AND CORONARY  ANGIOGRAPHY;  Surgeon: Iran Ouch, MD;  Location: [facility] INVASIVE CV LAB;  Service: Cardiovascular;  Laterality: N/A;   Low back disc surgery     06/2000   TOTAL KNEE ARTHROPLASTY Right 11/2016    OB History     Gravida  0   Para  0   Term  0   Preterm  0   AB  0   Living  0      SAB  0   IAB  0   Ectopic  0   Multiple  0   Live Births  0            Home Medications    Prior to Admission medications   Medication Sig Start Date End Date Taking? Authorizing Provider  cefdinir (OMNICEF) 300 MG capsule  Take 1 capsule (300 mg total) by mouth 2 (two) times daily for 7 days. 07/06/23 07/13/23 Yes Carlisle Beers, FNP  acetaminophen (TYLENOL) 500 MG tablet Take 1,000 mg by mouth every 12 (twelve) hours as needed for mild pain or moderate pain.    [provider]  albuterol (PROVENTIL) (2.5 MG/3ML) 0.083% nebulizer solution Take 3 mLs (2.5 mg total) by nebulization every 6 (six) hours as needed for wheezing or shortness of breath. 11/30/22   Olive Bass, FNP  amLODipine (NORVASC) 5 MG tablet Take 1 tablet (5 mg total) by mouth daily. 07/03/23   Lonia Blood, MD  atorvastatin (LIPITOR) 80 MG tablet Take 1 tablet (80 mg total) by mouth daily. 07/03/23   Lonia Blood, MD  B Complex-C (B-COMPLEX WITH VITAMIN C) tablet Take 1 tablet by mouth daily. 09/06/19   [provider]  Cholecalciferol (VITAMIN D3) 50 MCG (2000 UT) TABS Take 1 tablet by mouth daily.    [provider]  EPINEPHrine (EPIPEN 2-PAK) 0.3 mg/0.3 mL IJ SOAJ injection Inject 0.3 mg into the muscle as needed for anaphylaxis. 01/18/23   Marcelyn Bruins, MD  esomeprazole (NEXIUM) 40 MG capsule Take 1 capsule (40 mg total) by mouth 2 (two) times daily before a meal. Patient taking differently: Take 40 mg by mouth daily. 11/30/22   Olive Bass, FNP  famotidine (PEPCID) 40 MG tablet Take 1 tablet (40 mg total) by mouth daily. 11/30/22   Olive Bass, FNP  Golimumab [facility] ARIA IV) Inject 200 mg into the vein every 8 (eight) weeks.    [provider]  levocetirizine (XYZAL) 5 MG tablet Take 10 mg by mouth every evening.    [provider]  linaclotide Karlene Einstein) 72 MCG capsule Take 144 mcg by mouth daily before breakfast. 03/23/23   [provider]  mirabegron ER (MYRBETRIQ) 50 MG TB24 tablet Take 1 tablet (50 mg total) by mouth daily. 11/30/22   Olive Bass, FNP  montelukast (SINGULAIR) 10 MG tablet Take 1 tablet (10 mg total) by  mouth at bedtime. Patient taking differently: Take 10 mg by mouth daily. 11/30/22   Olive Bass, FNP  nitroGLYCERIN (NITROSTAT) 0.4 MG SL tablet Place 1 tablet (0.4 mg total) under the tongue every 5 (five) minutes as needed for chest pain. 10/13/22   Georgeanna Lea, MD  OXYGEN Inhale 2 L into the lungs at bedtime.    [provider]  sucralfate (CARAFATE) 1 g tablet Take 1 g by mouth 4 (four) times daily. Patient not taking: Reported on 07/04/2023 07/03/23   [provider]  ticagrelor (BRILINTA) 90 MG TABS tablet Take 1 tablet (  90 mg total) by mouth 2 (two) times daily. 07/01/23   Lonia Blood, MD  ZOLMitriptan (ZOMIG) 2.5 MG tablet Take 1 tablet (2.5 mg total) by mouth once for 1 dose. May repeat in 2 hours if headache persists or recurs. Patient taking differently: Take 2.5 mg by mouth daily as needed for migraine. 11/30/22 06/29/23  Olive Bass, FNP    Family History Family History  Adopted: Yes  Problem Relation Age of Onset   Diabetes Paternal Grandmother    Diabetes Father    Hypertension Father    Diabetes Mother    Hypertension Mother    Hypertension Brother    Diabetes Brother    Hypertension Sister    Diabetes Sister    Cancer Other    Asthma Neg Hx    Allergic rhinitis Neg Hx    Atopy Neg Hx    Eczema Neg Hx     Social History Social History   Tobacco Use   Smoking status: Never    Passive exposure: Never   Smokeless tobacco: Never  Vaping Use   Vaping status: Never Used  Substance Use Topics   Alcohol use: Yes    Comment: rare   Drug use: Never     Allergies   Albuterol sulfate, Tape, Formoterol, Hydrocodone, Hydrocodone-acetaminophen, Nickel, Other, Oxycodone-acetaminophen, Salmeterol, Strawberry extract, Advair hfa [fluticasone-salmeterol], Proventil hfa [albuterol], Asa [aspirin], Cleocin [clindamycin], Clindamycin/lincomycin, Fludrocortisone acetate, Gabapentin, Hydrochlorothiazide, Imipramine,  Ipratropium bromide, Medroxyprogesterone, Meloxicam, Metformin and related, Nystatin, Penicillins, Pred forte [prednisolone], Prednisone, Solu-medrol [methylprednisolone], Sulfa antibiotics, Tegaderm ag mesh [silver], Toradol [ketorolac tromethamine], and Tramadol   Review of Systems Review of Systems Per HPI  Physical Exam Triage Vital Signs ED Triage Vitals  Encounter Vitals Group     BP 07/06/23 0854 136/63     Systolic BP Percentile --      Diastolic BP Percentile --      Pulse Rate 07/06/23 0854 83     Resp 07/06/23 0854 18     Temp 07/06/23 0854 98.4 F (36.9 C)     Temp Source 07/06/23 0854 Oral     SpO2 07/06/23 0854 96 %     Weight --      Height --      Head Circumference --      Peak Flow --      Pain Score 07/06/23 0851 6     Pain Loc --      Pain Education --      Exclude from Growth Chart --    No data found.  Updated Vital Signs BP 136/63 (BP Location: Right Arm)   Pulse 83   Temp 98.4 F (36.9 C) (Oral)   Resp 18   SpO2 96%   Visual Acuity Right Eye Distance:   Left Eye Distance:   Bilateral Distance:    Right Eye Near:   Left Eye Near:    Bilateral Near:     Physical Exam Vitals and nursing note reviewed.  Constitutional:      Appearance: She is not ill-appearing or toxic-appearing.  HENT:     Head: Normocephalic and atraumatic.     Right Ear: Hearing and external ear normal. Swelling and tenderness present. No drainage. There is impacted cerumen (Copious cerumen to the right ear canal obstructing view of tympanic membrane on initial assessment). Tympanic membrane is erythematous and bulging. Tympanic membrane is not perforated.     Left Ear: Hearing, tympanic membrane, ear canal and external ear normal.  Nose: Nose normal.     Mouth/Throat:     Lips: Pink.  Eyes:     General: Lids are normal. Vision grossly intact. Gaze aligned appropriately.     Extraocular Movements: Extraocular movements intact.     Conjunctiva/sclera: Conjunctivae  normal.  Cardiovascular:     Rate and Rhythm: Normal rate and regular rhythm.     Heart sounds: Normal heart sounds, S1 normal and S2 normal.  Pulmonary:     Effort: Pulmonary effort is normal. No respiratory distress.     Breath sounds: Normal breath sounds and air entry.  Musculoskeletal:     Cervical back: Neck supple.  Skin:    General: Skin is warm and dry.     Capillary Refill: Capillary refill takes less than 2 seconds.     Findings: No rash.  Neurological:     General: No focal deficit present.     Mental Status: She is alert and oriented to person, place, and time. Mental status is at baseline.     Cranial Nerves: No dysarthria or facial asymmetry.  Psychiatric:        Mood and Affect: Mood normal.        Speech: Speech normal.        Behavior: Behavior normal.        Thought Content: Thought content normal.        Judgment: Judgment normal.      UC Treatments / Results  Labs (all labs ordered are listed, but only abnormal results are displayed) Labs Reviewed  SARS CORONAVIRUS 2 (TAT 6-24 HRS)    EKG   Radiology No results found.  Procedures Procedures (including critical care time)  Medications Ordered in UC Medications - No data to display  Initial Impression / Assessment and Plan / UC Course  I have reviewed the triage vital signs and the nursing notes.  Pertinent labs & imaging results that were available during my care of the patient were reviewed by me and considered in my medical decision making (see chart for details).   1.  Viral syndrome, right otitis media, impacted cerumen of right ear Nursing staff performed ear lavage to remove right ear wax.  This reveals right otitis media.  Multiple allergies to antibiotics penicillins (rash).  Patient is open to taking a cephalosporin after discussing risks of rash given penicillin allergy.  Cefdinir twice daily for 7 days ordered.  Tylenol as needed for pain.  Congestion and cough likely viral.   COVID-19 testing is pending, staff will call if positive.  Will manage this with guaifenesin as needed for congestion and cough. Lung sounds normal, vital signs hemodynamically stable, therefore deferred imaging of the chest.  PCP follow-up encouraged in the next 3 to 5 days as needed.  Counseled patient on potential for adverse effects with medications prescribed/recommended today, strict ER and return-to-clinic precautions discussed, patient verbalized understanding.    Final Clinical Impressions(s) / UC Diagnoses   Final diagnoses:  Viral syndrome  Impacted cerumen of right ear  Right otitis media, unspecified otitis media type     Discharge Instructions      COVID test is pending, staff will call you if this is positive. You have a right-sided ear infection. Take cefdinir antibiotic every 12 hours for the next 7 days. Tylenol as needed for pain and chills. Guaifenesin (mucinex) 600mg  every 12 hours as needed for nasal congestion/cough.  If you develop any new or worsening symptoms or if your symptoms do not start to  improve, please return here or follow-up with your primary care provider. If your symptoms are severe, please go to the emergency room.    ED Prescriptions     Medication Sig Dispense Auth. Provider   cefdinir (OMNICEF) 300 MG capsule Take 1 capsule (300 mg total) by mouth 2 (two) times daily for 7 days. 14 capsule Carlisle Beers, FNP      PDMP not reviewed this encounter.   Reita May [place], [place] 07/06/23 [id number]

## 2023-07-07 LAB — SARS CORONAVIRUS 2 (TAT 6-24 HRS): SARS Coronavirus 2: NEGATIVE

## 2023-07-11 ENCOUNTER — Telehealth: Payer: Self-pay

## 2023-07-11 NOTE — Transitions of Care (Post Inpatient/ED Visit) (Signed)
 07/11/2023  Name: Casey [name] MRN: [id number] DOB: 12-27-1970  Today's TOC FU Call Status: Today's TOC FU Call Status:: Successful TOC FU Call Completed Unsuccessful Call (1st Attempt) Date: 07/05/23 Patient's Name and Date of Birth confirmed.  Transition Care Management Follow-up Telephone Call Date of Discharge: 07/02/23 Discharge Facility: [facility]) Type of Discharge: Inpatient Admission Primary Inpatient Discharge Diagnosis:: stent placement How have you been since you were released from the hospital?: Same Any questions or concerns?: Yes Patient Questions/Concerns:: regarding cardiac rehab needs to know who to call  Items Reviewed: Did you receive and understand the discharge instructions provided?: Yes Medications obtained,verified, and reconciled?: Yes (Medications Reviewed) Any new allergies since your discharge?: No Dietary orders reviewed?: Yes Type of Diet Ordered:: heart healthy Do you have support at home?: Yes  Medications Reviewed Today: Medications Reviewed Today     Reviewed by Nikyla Navedo, Jordan Hawks, CMA (Certified Medical Assistant) on 07/11/23 at [phone]  Med List Status: <None>   Medication Order Taking? Sig Documenting Provider Last Dose Status Informant  acetaminophen (TYLENOL) 500 MG tablet [id number] Yes Take 1,000 mg by mouth every 12 (twelve) hours as needed for mild pain or moderate pain. [provider] Taking Active Self  albuterol (PROVENTIL) (2.5 MG/3ML) 0.083% nebulizer solution [id number] Yes Take 3 mLs (2.5 mg total) by nebulization every 6 (six) hours as needed for wheezing or shortness of breath. Olive Bass, FNP Taking Active Self           Med Note (SATTERFIELD, Genoveva Ill   Wed Jun 29, 2023  9:14 PM) Patient can take this medication however she cannot take the Albuterol Sulfate per patient.[name]   amLODipine (NORVASC) 5 MG tablet [id number] Yes Take 1 tablet (5 mg total) by mouth daily. Lonia Blood, MD Taking Active    atorvastatin (LIPITOR) 80 MG tablet [id number] Yes Take 1 tablet (80 mg total) by mouth daily. Lonia Blood, MD Taking Active   B Complex-C (B-COMPLEX WITH VITAMIN C) tablet [id number] Yes Take 1 tablet by mouth daily. [provider] Taking Active Self  cefdinir (OMNICEF) 300 MG capsule [id number] Yes Take 1 capsule (300 mg total) by mouth 2 (two) times daily for 7 days. Carlisle Beers, FNP Taking Active   Cholecalciferol (VITAMIN D3) 50 MCG (2000 UT) TABS [id number] Yes Take 1 tablet by mouth daily. [provider] Taking Active Self  EPINEPHrine (EPIPEN 2-PAK) 0.3 mg/0.3 mL IJ SOAJ injection [id number] Yes Inject 0.3 mg into the muscle as needed for anaphylaxis. Marcelyn Bruins, MD Taking Active Self  esomeprazole (NEXIUM) 40 MG capsule [id number] Yes Take 1 capsule (40 mg total) by mouth 2 (two) times daily before a meal.  Patient taking differently: Take 40 mg by mouth daily.   Olive Bass, FNP Taking Active Self  famotidine (PEPCID) 40 MG tablet [id number] Yes Take 1 tablet (40 mg total) by mouth daily. Olive Bass, FNP Taking Active Self  Golimumab [facility] ARIA IV) [id number] Yes Inject 200 mg into the vein every 8 (eight) weeks. [provider] Taking Active Self  levocetirizine (XYZAL) 5 MG tablet [id number] Yes Take 10 mg by mouth every evening. [provider] Taking Active Self  linaclotide Karlene Einstein) 72 MCG capsule [id number] Yes Take 144 mcg by mouth daily before breakfast. [provider] Taking Active Self  mirabegron ER (MYRBETRIQ) 50 MG TB24 tablet [id number] Yes Take 1 tablet (50 mg total) by mouth daily. Olive Bass, FNP Taking Active Self  montelukast (  SINGULAIR) 10 MG tablet [id number] Yes Take 1 tablet (10 mg total) by mouth at bedtime.  Patient taking differently: Take 10 mg by mouth daily.   Olive Bass, FNP Taking Active Self  nitroGLYCERIN (NITROSTAT) 0.4 MG SL tablet  [id number] Yes Place 1 tablet (0.4 mg total) under the tongue every 5 (five) minutes as needed for chest pain. Georgeanna Lea, MD Taking Active Self  OXYGEN [id number] Yes Inhale 2 L into the lungs at bedtime. [provider] Taking Active Self  sucralfate (CARAFATE) 1 g tablet [id number] Yes Take 1 g by mouth 4 (four) times daily. [provider] Taking Active   ticagrelor (BRILINTA) 90 MG TABS tablet [id number] Yes Take 1 tablet (90 mg total) by mouth 2 (two) times daily. Lonia Blood, MD Taking Active   ZOLMitriptan (ZOMIG) 2.5 MG tablet [id number]  Take 1 tablet (2.5 mg total) by mouth once for 1 dose. May repeat in 2 hours if headache persists or recurs.  Patient taking differently: Take 2.5 mg by mouth daily as needed for migraine.   Olive Bass, FNP  Expired 06/29/23 2359 Self           Med Note (SATTERFIELD, DARIUS E   Wed Jun 29, 2023  9:12 PM) Still has on hand             Home Care and Equipment/Supplies: Were Home Health Services Ordered?: NA Any new equipment or medical supplies ordered?: NA  Functional Questionnaire: Do you need assistance with bathing/showering or dressing?: No Do you need assistance with meal preparation?: No Do you need assistance with eating?: No Do you have difficulty maintaining continence: No Do you need assistance with getting out of bed/getting out of a chair/moving?: No Do you have difficulty managing or taking your medications?: No  Follow up appointments reviewed: PCP Follow-up appointment confirmed?: No MD Provider Line Number:[phone] Given: No (patient followed up with cardiology) Specialist Hospital Follow-up appointment confirmed?: Yes Date of Specialist follow-up appointment?: 07/04/23 (patient had the follow up prior to returning [facility] call) Follow-Up Specialty Provider:: Wallis Bamberg Do you need transportation to your follow-up appointment?: No Do you understand care options if your  condition(s) worsen?: Yes-patient verbalized understanding   Adarrius Graeff, CMA  [facility] AWV Team Direct Dial: [phone]

## 2023-07-11 NOTE — Telephone Encounter (Signed)
Pt viewed results on My Chart per Dr. Krasowski's note. Routed to PCP.  

## 2023-07-12 ENCOUNTER — Telehealth: Payer: 59 | Admitting: Physician Assistant

## 2023-07-12 DIAGNOSIS — B3731 Acute candidiasis of vulva and vagina: Secondary | ICD-10-CM | POA: Diagnosis not present

## 2023-07-12 MED ORDER — FLUCONAZOLE 150 MG PO TABS: 150.0000 mg | ORAL_TABLET | Freq: Every day | ORAL | 0 refills | Status: DC

## 2023-07-12 NOTE — Patient Instructions (Signed)
Casey [name], thank you for joining Tylene Fantasia Ward, PA-C for today's virtual visit.  While this provider is not your primary care provider (PCP), if your PCP is located in our provider database this encounter information will be shared with them immediately following your visit.   A [facility] MyChart account gives you access to today's visit and all your visits, tests, and labs performed at [facility] " click here if you don't have a [facility] MyChart account or go to [email or web address]  Consent: (Patient) Casey [name] provided verbal consent for this virtual visit at the beginning of the encounter.  Current Medications:  Current Outpatient Medications:    fluconazole (DIFLUCAN) 150 MG tablet, Take 1 tablet (150 mg total) by mouth daily. Take one tablet by mouth, if no improvement after 72 hours take second tablet, Disp: 2 tablet, Rfl: 0   acetaminophen (TYLENOL) 500 MG tablet, Take 1,000 mg by mouth every 12 (twelve) hours as needed for mild pain or moderate pain., Disp: , Rfl:    albuterol (PROVENTIL) (2.5 MG/3ML) 0.083% nebulizer solution, Take 3 mLs (2.5 mg total) by nebulization every 6 (six) hours as needed for wheezing or shortness of breath., Disp: 150 mL, Rfl: 1   amLODipine (NORVASC) 5 MG tablet, Take 1 tablet (5 mg total) by mouth daily., Disp: 30 tablet, Rfl: 2   atorvastatin (LIPITOR) 80 MG tablet, Take 1 tablet (80 mg total) by mouth daily., Disp: 30 tablet, Rfl: 2   B Complex-C (B-COMPLEX WITH VITAMIN C) tablet, Take 1 tablet by mouth daily., Disp: , Rfl:    cefdinir (OMNICEF) 300 MG capsule, Take 1 capsule (300 mg total) by mouth 2 (two) times daily for 7 days., Disp: 14 capsule, Rfl: 0   Cholecalciferol (VITAMIN D3) 50 MCG (2000 UT) TABS, Take 1 tablet by mouth daily., Disp: , Rfl:    EPINEPHrine (EPIPEN 2-PAK) 0.3 mg/0.3 mL IJ SOAJ injection, Inject 0.3 mg into the muscle as needed for anaphylaxis., Disp: 2 each, Rfl: 2   esomeprazole (NEXIUM) 40 MG  capsule, Take 1 capsule (40 mg total) by mouth 2 (two) times daily before a meal. (Patient taking differently: Take 40 mg by mouth daily.), Disp: 60 capsule, Rfl: 11   famotidine (PEPCID) 40 MG tablet, Take 1 tablet (40 mg total) by mouth daily., Disp: 30 tablet, Rfl: 11   Golimumab (SIMPONI ARIA IV), Inject 200 mg into the vein every 8 (eight) weeks., Disp: , Rfl:    levocetirizine (XYZAL) 5 MG tablet, Take 10 mg by mouth every evening., Disp: , Rfl:    linaclotide (LINZESS) 72 MCG capsule, Take 144 mcg by mouth daily before breakfast., Disp: , Rfl:    mirabegron ER (MYRBETRIQ) 50 MG TB24 tablet, Take 1 tablet (50 mg total) by mouth daily., Disp: 30 tablet, Rfl: 11   montelukast (SINGULAIR) 10 MG tablet, Take 1 tablet (10 mg total) by mouth at bedtime. (Patient taking differently: Take 10 mg by mouth daily.), Disp: 30 tablet, Rfl: 11   nitroGLYCERIN (NITROSTAT) 0.4 MG SL tablet, Place 1 tablet (0.4 mg total) under the tongue every 5 (five) minutes as needed for chest pain., Disp: 25 tablet, Rfl: 6   OXYGEN, Inhale 2 L into the lungs at bedtime., Disp: , Rfl:    sucralfate (CARAFATE) 1 g tablet, Take 1 g by mouth 4 (four) times daily., Disp: , Rfl:    ticagrelor (BRILINTA) 90 MG TABS tablet, Take 1 tablet (90 mg total) by mouth 2 (two) times daily.,  Disp: 60 tablet, Rfl: 5   ZOLMitriptan (ZOMIG) 2.5 MG tablet, Take 1 tablet (2.5 mg total) by mouth once for 1 dose. May repeat in 2 hours if headache persists or recurs. (Patient taking differently: Take 2.5 mg by mouth daily as needed for migraine.), Disp: 10 tablet, Rfl: 1   Medications ordered in this encounter:  Meds ordered this encounter  Medications   fluconazole (DIFLUCAN) 150 MG tablet    Sig: Take 1 tablet (150 mg total) by mouth daily. Take one tablet by mouth, if no improvement after 72 hours take second tablet    Dispense:  2 tablet    Refill:  0    Order Specific Question:   Supervising Provider    Answer:   Merrilee Jansky [[id number]]      *If you need refills on other medications prior to your next appointment, please contact your pharmacy*  Follow-Up: Call back or seek an in-person evaluation if the symptoms worsen or if the condition fails to improve as anticipated.  [facility] [phone]  Other Instructions Take diflucan as prescribed.  Recommend in person evaluation with your Primary Care Physician.    If you have been instructed to have an in-person evaluation today at a local Urgent Care facility, please use the link below. It will take you to a list of all of our available [facility], including address, phone number and hours of operation. Please do not delay care.  [facility]  If you or a family member do not have a primary care provider, use the link below to schedule a visit and establish care. When you choose a [facility] primary care physician or advanced practice provider, you gain a long-term partner in health. Find a Primary Care Provider  Learn more about [facility]'s in-office and virtual care options: [facility] - Get Care Now

## 2023-07-12 NOTE — Progress Notes (Signed)
Virtual Visit Consent   Casey [name], you are scheduled for a virtual visit with a [facility] provider today. Just as with appointments in the office, your consent must be obtained to participate. Your consent will be active for this visit and any virtual visit you may have with one of our providers in the next 365 days. If you have a MyChart account, a copy of this consent can be sent to you electronically.  As this is a virtual visit, video technology does not allow for your provider to perform a traditional examination. This may limit your provider's ability to fully assess your condition. If your provider identifies any concerns that need to be evaluated in person or the need to arrange testing (such as labs, EKG, etc.), we will make arrangements to do so. Although advances in technology are sophisticated, we cannot ensure that it will always work on either your end or our end. If the connection with a video visit is poor, the visit may have to be switched to a telephone visit. With either a video or telephone visit, we are not always able to ensure that we have a secure connection.  By engaging in this virtual visit, you consent to the provision of healthcare and authorize for your insurance to be billed (if applicable) for the services provided during this visit. Depending on your insurance coverage, you may receive a charge related to this service.  I need to obtain your verbal consent now. Are you willing to proceed with your visit today? [name] has provided verbal consent on 07/12/2023 for a virtual visit (video or telephone). Tylene Fantasia Ward, PA-C  Date: 07/12/2023 6:28 PM  Virtual Visit via Video Note   I, Tylene Fantasia Ward, connected with  Casey [name]  ([id number], 02-Jun-1971) on 07/12/23 at  6:30 PM EST by a video-enabled telemedicine application and verified that I am speaking with the correct person using two identifiers.  Location: Patient: Virtual Visit Location Patient:  Home Provider: Virtual Visit Location Provider: Home Office   I discussed the limitations of evaluation and management by telemedicine and the availability of in person appointments. The patient expressed understanding and agreed to proceed.    History of Present Illness: Casey [name] is a 52 y.o. who identifies as a female who was assigned female at birth, and is being seen today for vaginal itching and swelling that started a few days ago.  She has been on recent antibiotic for ear infection.  She denies dysuria, increased vaginal discharge.  She reports discomfort is more around her urethra.  She reports problems with candidal infections in the past.  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   Coronary artery disease moderate, hemodynamically insignificant proximal LAD based on coronary CT angio from 2024 12/28/2022   Precordial chest pain 10/13/2022   Hypercholesterolemia 10/07/2022   Hiatal hernia 10/07/2022   Esophageal candidiasis (HCC) 06/26/2022   Urinary tract infection, site not specified 06/26/2022   Ureteral stone 01/20/2022   Encounter for monitoring immunomodulating therapy 11/20/2021   Rheumatoid arthritis of multiple sites with 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 factor (HCC) 11/16/2021   Pyelonephritis 09/25/2021   De Quervain's tenosynovitis, left 07/31/2021  Contusion of left wrist 07/08/2021   Status post placement of ureteral stent 02/06/2021   Proteinuria 08/12/2020   Environmental allergies 08/12/2020   History of kidney stones 08/12/2020   Tooth infection 08/12/2020   Gastroesophageal reflux disease with esophagitis without hemorrhage 08/01/2019   Slow transit  constipation 08/01/2019   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   History of colon polyps 06/19/2019   Irregular astigmatism of both eyes 10/11/2018   Nephrolithiasis 09/08/2018   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   Plantar wart 11/04/2016   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   Myopia with astigmatism and presbyopia 05/07/2016   Hematochezia 03/18/2016   Overactive bladder 11/18/2015   Incontinence 10/07/2015   Albuminuria 06/27/2015   Hypertension, essential, benign 06/27/2015   Microhematuria 06/27/2015   Hematuria 06/18/2015   Cataract 05/06/2015   Dry eyes 05/06/2015   Crohn's disease (HCC) 04/21/2015   Osteoarthritis of both knees 04/21/2015   Right knee pain 04/21/2015   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/30/2014   History of abnormal cervical Pap smear 11/29/2014   Fibrositis 08/28/2014   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   Migraine without aura and without status migrainosus, not intractable 08/28/2014   Migraine 08/28/2014   Seronegative inflammatory arthritis 08/28/2014   Increased urinary frequency 07/16/2013   Dysfunction of both eustachian tubes 08/31/2012   Asthma 10/30/2008   Mild intermittent asthma without complication 10/30/2008    Allergies:  Allergies  Allergen Reactions   Albuterol Sulfate Shortness Of Breath    Other reaction(s): Other (See Comments)  can't breathe  Other reaction(s): Other (See Comments) can't breathe   Tape     Other reaction(s): Other (See Comments), Other (See Comments), Other (See Comments)  severe skin breakdown  Cast and Bandage Cover  severe skin breakdown  severe skin breakdown   Formoterol Other (See Comments)    Other reaction(s): Headaches  Other reaction(s): Headaches  Other reaction(s): Headaches  Headaches  Other reaction(s): Headaches Other reaction(s): Headaches    Other reaction(s): Headaches    Headaches   Hydrocodone Rash   Hydrocodone-Acetaminophen Rash    Nickel Rash and Other (See Comments)   Other Rash    cast and bandage use  cast and bandage use  cast and bandage use  cast and bandage use    cast and bandage use cast and bandage use   Oxycodone-Acetaminophen Other (See Comments)    headache   Salmeterol Rash   Strawberry Extract Hives    Full body rash from strawberry   Advair Hfa [Fluticasone-Salmeterol]     unknown   Proventil Hfa [Albuterol]     unknown   Asa [Aspirin] Rash   Cleocin [Clindamycin] Rash   Clindamycin/Lincomycin Rash   Fludrocortisone Acetate Rash   Gabapentin Rash   Hydrochlorothiazide Rash   Imipramine Rash   Ipratropium Bromide Rash   Medroxyprogesterone Rash   Meloxicam Rash   Metformin And Related Rash   Nystatin Rash   Penicillins Rash   Pred Forte [Prednisolone] Rash   Prednisone Rash   Solu-Medrol [Methylprednisolone] Rash   Sulfa Antibiotics Rash   Tegaderm Ag Mesh [Silver] Rash   Toradol [Ketorolac Tromethamine] Rash   Tramadol Rash   Medications:  Current Outpatient Medications:    fluconazole (DIFLUCAN) 150 MG tablet, Take 1 tablet (150 mg total) by mouth daily. Take one tablet by mouth, if no improvement after 72 hours take second tablet, Disp: 2 tablet, Rfl: 0   acetaminophen (TYLENOL) 500 MG tablet, Take 1,000 mg by mouth every  12 (twelve) hours as needed for mild pain or moderate pain., Disp: , Rfl:    albuterol (PROVENTIL) (2.5 MG/3ML) 0.083% nebulizer solution, Take 3 mLs (2.5 mg total) by nebulization every 6 (six) hours as needed for wheezing or shortness of breath., Disp: 150 mL, Rfl: 1   amLODipine (NORVASC) 5 MG tablet, Take 1 tablet (5 mg total) by mouth daily., Disp: 30 tablet, Rfl: 2   atorvastatin (LIPITOR) 80 MG tablet, Take 1 tablet (80 mg total) by mouth daily., Disp: 30 tablet, Rfl: 2   B Complex-C (B-COMPLEX WITH VITAMIN C) tablet, Take 1 tablet by mouth daily., Disp: , Rfl:    cefdinir (OMNICEF) 300 MG capsule, Take 1 capsule (300 mg total) by mouth 2 (two) times  daily for 7 days., Disp: 14 capsule, Rfl: 0   Cholecalciferol (VITAMIN D3) 50 MCG (2000 UT) TABS, Take 1 tablet by mouth daily., Disp: , Rfl:    EPINEPHrine (EPIPEN 2-PAK) 0.3 mg/0.3 mL IJ SOAJ injection, Inject 0.3 mg into the muscle as needed for anaphylaxis., Disp: 2 each, Rfl: 2   esomeprazole (NEXIUM) 40 MG capsule, Take 1 capsule (40 mg total) by mouth 2 (two) times daily before a meal. (Patient taking differently: Take 40 mg by mouth daily.), Disp: 60 capsule, Rfl: 11   famotidine (PEPCID) 40 MG tablet, Take 1 tablet (40 mg total) by mouth daily., Disp: 30 tablet, Rfl: 11   Golimumab (SIMPONI ARIA IV), Inject 200 mg into the vein every 8 (eight) weeks., Disp: , Rfl:    levocetirizine (XYZAL) 5 MG tablet, Take 10 mg by mouth every evening., Disp: , Rfl:    linaclotide (LINZESS) 72 MCG capsule, Take 144 mcg by mouth daily before breakfast., Disp: , Rfl:    mirabegron ER (MYRBETRIQ) 50 MG TB24 tablet, Take 1 tablet (50 mg total) by mouth daily., Disp: 30 tablet, Rfl: 11   montelukast (SINGULAIR) 10 MG tablet, Take 1 tablet (10 mg total) by mouth at bedtime. (Patient taking differently: Take 10 mg by mouth daily.), Disp: 30 tablet, Rfl: 11   nitroGLYCERIN (NITROSTAT) 0.4 MG SL tablet, Place 1 tablet (0.4 mg total) under the tongue every 5 (five) minutes as needed for chest pain., Disp: 25 tablet, Rfl: 6   OXYGEN, Inhale 2 L into the lungs at bedtime., Disp: , Rfl:    sucralfate (CARAFATE) 1 g tablet, Take 1 g by mouth 4 (four) times daily., Disp: , Rfl:    ticagrelor (BRILINTA) 90 MG TABS tablet, Take 1 tablet (90 mg total) by mouth 2 (two) times daily., Disp: 60 tablet, Rfl: 5   ZOLMitriptan (ZOMIG) 2.5 MG tablet, Take 1 tablet (2.5 mg total) by mouth once for 1 dose. May repeat in 2 hours if headache persists or recurs. (Patient taking differently: Take 2.5 mg by mouth daily as needed for migraine.), Disp: 10 tablet, Rfl: 1  Observations/Objective: Patient is well-developed, well-nourished in  no acute distress.  Resting comfortably  at home.  Head is normocephalic, atraumatic.  No labored breathing.  Speech is clear and coherent with logical content.  Patient is alert and oriented at baseline.    Assessment and Plan: 1. Vaginal yeast infection  Sx possibly vaginal yeast given itching, swelling, recent antibiotic use.  Will send in diflucan.  Advised to make an appointment with pcp given h/o candidal infections in the past.   Follow Up Instructions: I discussed the assessment and treatment plan with the patient. The patient was provided an opportunity to ask questions and all  were answered. The patient agreed with the plan and demonstrated an understanding of the instructions.  A copy of instructions were sent to the patient via MyChart unless otherwise noted below.     The patient was advised to call back or seek an in-person evaluation if the symptoms worsen or if the condition fails to improve as anticipated.    Tylene Fantasia Ward, PA-C

## 2023-07-14 ENCOUNTER — Other Ambulatory Visit (HOSPITAL_COMMUNITY)
Admission: RE | Admit: 2023-07-14 | Discharge: 2023-07-14 | Disposition: A | Discharge status: Home / Self Care | Payer: 59 | Source: Ambulatory Visit | Attending: Family | Admitting: Family

## 2023-07-14 ENCOUNTER — Encounter: Payer: Self-pay | Admitting: Family

## 2023-07-14 ENCOUNTER — Other Ambulatory Visit: Payer: Self-pay | Admitting: Family

## 2023-07-14 ENCOUNTER — Ambulatory Visit: Payer: 59 | Admitting: Family

## 2023-07-14 VITALS — BP 122/64 | HR 97 | Resp 18 | Ht 63.0 in | Wt 222.4 lb

## 2023-07-14 DIAGNOSIS — R300 Dysuria: Secondary | ICD-10-CM

## 2023-07-14 DIAGNOSIS — N949 Unspecified condition associated with female genital organs and menstrual cycle: Secondary | ICD-10-CM | POA: Insufficient documentation

## 2023-07-14 DIAGNOSIS — R319 Hematuria, unspecified: Secondary | ICD-10-CM

## 2023-07-14 LAB — POCT URINALYSIS DIPSTICK
Glucose, UA: NEGATIVE
Ketones, UA: NEGATIVE
Leukocytes, UA: NEGATIVE
Nitrite, UA: NEGATIVE
Protein, UA: POSITIVE — AB
Spec Grav, UA: 1.025 (ref 1.010–1.025)
Urobilinogen, UA: 0.2 U/dL
pH, UA: 6 (ref 5.0–8.0)

## 2023-07-14 NOTE — Progress Notes (Signed)
Casey [name] is a 52 y.o. 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   Coronary artery disease moderate, hemodynamically insignificant proximal LAD based on coronary CT angio from 2024 12/28/2022   Precordial chest pain 10/13/2022   Hypercholesterolemia 10/07/2022   Hiatal hernia 10/07/2022   Esophageal candidiasis (HCC) 06/26/2022   Urinary tract infection, site not specified 06/26/2022   Ureteral stone 01/20/2022   Encounter for monitoring immunomodulating therapy 11/20/2021   Rheumatoid arthritis of multiple sites with 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 factor (HCC) 11/16/2021   Pyelonephritis 09/25/2021   De Quervain's tenosynovitis, left 07/31/2021   Contusion of left wrist 07/08/2021   Status post placement of ureteral stent 02/06/2021   Proteinuria 08/12/2020   Environmental allergies 08/12/2020   History of kidney stones 08/12/2020   Tooth infection 08/12/2020   Gastroesophageal reflux disease with esophagitis without hemorrhage 08/01/2019   Slow transit constipation 08/01/2019   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   History of colon polyps 06/19/2019   Irregular astigmatism of both eyes 10/11/2018   Nephrolithiasis 09/08/2018   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   Plantar wart 11/04/2016   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   Myopia with astigmatism and presbyopia 05/07/2016   Hematochezia 03/18/2016   Overactive bladder 11/18/2015   Incontinence 10/07/2015   Albuminuria 06/27/2015   Hypertension, essential, benign 06/27/2015   Microhematuria 06/27/2015   Hematuria 06/18/2015   Cataract 05/06/2015   Dry eyes 05/06/2015   Crohn's disease (HCC)  04/21/2015   Osteoarthritis of both knees 04/21/2015   Right knee pain 04/21/2015   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/30/2014   History of abnormal cervical Pap smear 11/29/2014   Fibrositis 08/28/2014   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     albuterol (PROVENTIL) (2.5 MG/3ML) 0.083% nebulizer solution Take 3 mLs (2.5 mg total) by nebulization every 6 (six) hours as needed for wheezing or shortness of breath. 150 mL 1   amLODipine (NORVASC) 5 MG tablet Take 1 tablet (5 mg total) by mouth daily. 30 tablet 2   atorvastatin (LIPITOR) 80 MG tablet Take 1 tablet (80 mg total) by mouth daily. 30 tablet 2   B Complex-C (B-COMPLEX WITH VITAMIN C) tablet Take 1 tablet by mouth daily.     Cholecalciferol (VITAMIN D3) 50 MCG (2000 UT) TABS Take 1 tablet by mouth daily.     EPINEPHrine (EPIPEN 2-PAK) 0.3 mg/0.3 mL IJ SOAJ injection Inject 0.3 mg into the muscle as needed for anaphylaxis. 2 each 2   esomeprazole (NEXIUM) 40 MG capsule Take 1 capsule (40 mg total) by mouth 2 (two) times daily before a meal. (Patient taking differently: Take 40 mg by mouth daily.) 60 capsule 11   famotidine (PEPCID) 40 MG tablet Take 1 tablet (40  mg total) by mouth daily. 30 tablet 11   fluconazole (DIFLUCAN) 150 MG tablet Take 1 tablet (150 mg total) by mouth daily. Take one tablet by mouth, if no improvement after 72 hours take second tablet 2 tablet 0   Golimumab (SIMPONI ARIA IV) Inject 200 mg into  the vein every 8 (eight) weeks.     levocetirizine (XYZAL) 5 MG tablet Take 10 mg by mouth every evening.     linaclotide (LINZESS) 72 MCG capsule Take 144 mcg by mouth daily before breakfast.     mirabegron ER (MYRBETRIQ) 50 MG TB24 tablet Take 1 tablet (50 mg total) by mouth daily. 30 tablet 11   montelukast (SINGULAIR) 10 MG tablet Take 1 tablet (10 mg total) by mouth at bedtime. (Patient taking differently: Take 10 mg by mouth daily.) 30 tablet 11   nitroGLYCERIN (NITROSTAT) 0.4 MG SL tablet Place 1 tablet (0.4 mg total) under the tongue every 5 (five) minutes as needed for chest pain. 25 tablet 6   OXYGEN Inhale 2 L into the lungs at bedtime.     ticagrelor (BRILINTA) 90 MG TABS tablet Take 1 tablet (90 mg total) by mouth 2 (two) times daily. 60 tablet 5   ZOLMitriptan (ZOMIG) 2.5 MG tablet Take 1 tablet (2.5 mg total) by mouth once for 1 dose. May repeat in 2 hours if headache persists or recurs. (Patient taking differently: Take 2.5 mg by mouth daily as needed for migraine.) 10 tablet 1   No current facility-administered medications for this visit.    Allergies: Albuterol sulfate, Tape, Formoterol, Hydrocodone, Hydrocodone-acetaminophen, Nickel, Other, Oxycodone-acetaminophen, Salmeterol, Strawberry extract, Advair hfa [fluticasone-salmeterol], Proventil hfa [albuterol], Asa [aspirin], Cleocin [clindamycin], Clindamycin/lincomycin, Fludrocortisone acetate, Gabapentin, Hydrochlorothiazide, Imipramine, Ipratropium bromide, Medroxyprogesterone, Meloxicam, Metformin and related, Nystatin, Penicillins, Pred forte [prednisolone], Prednisone, Solu-medrol [methylprednisolone], Sulfa antibiotics, Tegaderm ag mesh [silver], Toradol [ketorolac tromethamine], and Tramadol  Past Medical History:  Diagnosis Date   Albuminuria 06/27/2015   Anxiety 11/04/2014   Arm DVT (deep venous thromboembolism), acute, left (HCC) 08/17/2022   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/30/2014   Asthma     Atopic dermatitis 10/30/2008   Formatting of this note might be different from the original. Atopic dermatitis Formatting of this note might be different from the original. Formatting of this note might be different from the original. Atopic dermatitis Formatting of this note might be different from the original. Formatting of this note might be different from the original. Formatting of this note might be different from the or   Cataract 05/06/2015   Contusion of left wrist 07/08/2021   Crohn's disease (HCC) 04/21/2015   De Quervain's tenosynovitis, left 07/31/2021   Diabetes mellitus without complication (HCC)    Diabetes type 2, controlled (HCC) 04/21/2015   Dry eyes 05/06/2015   DVT (deep venous thrombosis) (HCC)    Dysfunction of both eustachian tubes 08/31/2012   Encounter for monitoring immunomodulating therapy 11/20/2021   Environmental allergies 08/12/2020   Esophageal candidiasis (HCC) 06/26/2022   Fibrositis 08/28/2014   Formatting of this note might be different from the original. Fibromyalgia Formatting of this note might be different from the original. Formatting of this note might be different from the original. Fibromyalgia   Flank pain 11/24/2020   Gastroesophageal reflux disease with esophagitis without hemorrhage 08/01/2019   Formatting of this note might be different from the original. Formatting of this note might be different from the original. 07/2019: chronic symptoms of esophageal reflux since prior to sleeve gastrectomy surgery. Symptoms have  worsened over the last 6 months with recent EGD findings of grade B esophagitis, irregular z line, erythematous mucosa, and evidence of sleeve gastrectomy. 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 Formatting of this note might be different from the original. 07/2019: 8 mm colonic polyp removed during colonoscopy, biopsy showed serrated polyp. Repeat colonoscopy recommended in one year. Formatting of this note might be different from the original. Added automatically from request for surgery [phone] Formatting of this note might b   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   History of kidney stones 08/12/2020   Hypercholesterolemia 10/07/2022   Hypertension, essential, benign 06/27/2015   Hypertensive disorder 08/28/2014   Formatting of this note might be different from the original. Hypertension Formatting of this note might be different from the original. Formatting of this note might be different from the original. 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 Keratoconus Formatting of this note might be different from the original. Formatting of this note might be different from the original. Keratoconus Formatting of this note might be different from the original. Formatting of this note might be different from the original. Keratoconus Formatting of this note might be different from the or   Microhematuria 06/27/2015   Migraine 08/28/2014   Formatting of this note might be different from the original. Migraine Formatting of this note might be different from the original. Formatting of this note might be different from the original. Migraine Formatting of this note might be different from the original. Migraine Formatting of this note might be different from the original. Migraine Formatting of this note might be different from the or   Migraine without aura and without status migrainosus, not intractable 08/28/2014   Formatting of this note might be different from the original. Formatting of this note  might be different from the original. Formatting of this note might be different from the original. Migraine Formatting of this note might be different from the original. Migraine Formatting of this note might be different from the original. Formatting of this note might be different from the original. Migraine F   Mild intermittent asthma without complication 10/30/2008   Formatting of this note might be different from the original. Formatting of this note might be different from the original. Formatting of this note might be different from the original. Asthma Formatting of this note might be different from the original. Asthma Formatting of this note might be different from the original. Formatting of this note might be different from the original. 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 Formatting of this note might be different from the original. 01/25/2016 with Dr. Dionisio David at [facility]. [place] of this note might be different from the original. 01/25/2016 with Dr. Dionisio David at [facility]. [place] of this note might be different from the original. Formatting of this note might be different from    Seronegative inflammatory arthritis 08/28/2014   Formatting of this note might be different from the original.  Formatting of this note might be different from the original. Arthritis; diagnosed as IBD related - 2012 Formatting of this note might be different from the original. Followed by rheumatologist. Had to change providers due to an insurance change earlier this year resulting in patient not being able to have Remicade for ~6 months. 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 Formatting of this note might be different from the original. Treated with PRN miralax. Recent colonoscopy 07/2019. Formatting of this note might be different from the original. Treated with PRN miralax. Recent colonoscopy 07/2019. Formatting of this note might be different from the original. Formatting of this note might be different f   Status post placement of ureteral stent 02/06/2021   Tooth infection 08/12/2020   Ureteral stone 01/20/2022   Urinary tract infection, site not specified 06/26/2022    Past Surgical History:  Procedure Laterality Date   BACK SURGERY     L5-S1   CERVICAL CONE BIOPSY  04/2000   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   CORONARY STENT INTERVENTION N/A 07/01/2023   Procedure: CORONARY STENT INTERVENTION;  Surgeon: Iran Ouch, MD;  Location: [facility];  Service: Cardiovascular;  Laterality: N/A;   EYE SURGERY     GASTRIC BYPASS  2017   Sleve   LAPAROSCOPIC GASTRIC SLEEVE RESECTION     LEFT HEART CATH AND CORONARY ANGIOGRAPHY N/A 07/01/2023   Procedure: LEFT HEART CATH AND CORONARY ANGIOGRAPHY;  Surgeon: Iran Ouch, MD;  Location: [facility];  Service: Cardiovascular;  Laterality: N/A;   Low back disc surgery     06/2000   TOTAL KNEE ARTHROPLASTY Right 11/2016    Family History  Adopted: Yes  Problem Relation Age of Onset   Diabetes Paternal Grandmother    Diabetes Father    Hypertension Father    Diabetes Mother    Hypertension Mother    Hypertension  Brother    Diabetes Brother    Hypertension Sister    Diabetes Sister    Cancer Other    Asthma Neg Hx    Allergic rhinitis Neg Hx    Atopy Neg Hx    Eczema Neg Hx     Social History   Tobacco Use   Smoking status: Never    Passive exposure: Never   Smokeless tobacco: Never  Substance Use Topics   Alcohol use: Yes    Comment: rare    Subjective:   Patient had virtual visit 2 days ago for possible yeast infection. She was given Rx for Diflucan but patient has opted against taking the medication. She notes that her normal yeast infections present with vaginal discharge and she is not having any discharge; she is feeling just a sense of vaginal pressure discomfort/ pressure and notes that using an ice pack has been beneficial;  She is overdue to see GYN- was referred  in April but has not been able to establish; she denies any vaginal spotting;   Objective:  Vitals:   07/14/23 1304  BP: 122/64  Pulse: 97  Resp: 18  SpO2: 98%  Weight: 222 lb 6.4 oz (100.9 kg)  Height: 5\' 3"  (1.6 m)    General: Well developed, well nourished, in no acute distress  Skin : Warm and dry.  Head: Normocephalic and atraumatic  Eyes: Sclera and conjunctiva clear; pupils round and reactive to light; extraocular movements intact  Ears: External normal; canals clear; tympanic membranes normal  Oropharynx: Pink, supple. No suspicious lesions  Neck: Supple without thyromegaly, adenopathy  Lungs: Respirations unlabored;  Neurologic: Alert and oriented; speech intact; face symmetrical; moves all extremities well; CNII-XII intact without focal deficit  Vaginal exam- physical exam is limited due to body habitus but no obvious discharge noted; external erythema is noted;   Assessment:  1. Dysuria   2. Vaginal discomfort     Plan:  Patient is concerned that a GYN issue other than yeast is causing her symptoms- will update vaginal swab today to rule out yeast and BV; patient is overdue to establish with GYN  and will update referral again;  Will also check urine culture today- patient is aware that U/A did show blood in sample today and she notes she will reach out to her nephrologist for further evaluation.   No follow-ups on file.  Orders Placed This Encounter  Procedures   Urine Culture   Ambulatory referral to Obstetrics / Gynecology    Referral Priority:   Routine    Referral Type:   Consultation    Referral Reason:   Specialty Services Required    Requested Specialty:   Obstetrics and Gynecology    Number of Visits Requested:   1   POCT Urinalysis Dipstick    Requested Prescriptions    No prescriptions requested or ordered in this encounter

## 2023-07-15 ENCOUNTER — Encounter (HOSPITAL_COMMUNITY): Payer: Self-pay

## 2023-07-15 ENCOUNTER — Telehealth (HOSPITAL_COMMUNITY): Payer: Self-pay

## 2023-07-15 ENCOUNTER — Other Ambulatory Visit: Payer: Self-pay | Admitting: Family

## 2023-07-15 LAB — CERVICOVAGINAL ANCILLARY ONLY
Bacterial Vaginitis (gardnerella): NEGATIVE
Candida Glabrata: NEGATIVE
Candida Vaginitis: POSITIVE — AB
Chlamydia: NEGATIVE
Comment: NEGATIVE
Comment: NEGATIVE
Comment: NEGATIVE
Comment: NEGATIVE
Comment: NEGATIVE
Comment: NORMAL
Neisseria Gonorrhea: NEGATIVE
Trichomonas: NEGATIVE

## 2023-07-15 LAB — URINE CULTURE
MICRO NUMBER:: 15813388
SPECIMEN QUALITY:: ADEQUATE

## 2023-07-15 MED ORDER — FLUCONAZOLE 100 MG PO TABS: 100.0000 mg | ORAL_TABLET | Freq: Every day | ORAL | 0 refills | Status: DC

## 2023-07-15 NOTE — Telephone Encounter (Signed)
Attempted to call patient in regards to Cardiac Rehab - LM on VM Mailed letter 

## 2023-07-18 ENCOUNTER — Ambulatory Visit: Payer: 59 | Admitting: Obstetrics & Gynecology

## 2023-07-18 ENCOUNTER — Encounter: Payer: Self-pay | Admitting: Obstetrics & Gynecology

## 2023-07-18 ENCOUNTER — Telehealth: Payer: 59 | Admitting: Nurse Practitioner

## 2023-07-18 ENCOUNTER — Other Ambulatory Visit: Payer: Self-pay

## 2023-07-18 ENCOUNTER — Other Ambulatory Visit (HOSPITAL_COMMUNITY)
Admission: RE | Admit: 2023-07-18 | Discharge: 2023-07-18 | Disposition: A | Discharge status: Home / Self Care | Payer: 59 | Source: Ambulatory Visit | Attending: Obstetrics & Gynecology | Admitting: Obstetrics & Gynecology

## 2023-07-18 VITALS — BP 120/87 | HR 89 | Wt 221.2 lb

## 2023-07-18 DIAGNOSIS — Z124 Encounter for screening for malignant neoplasm of cervix: Secondary | ICD-10-CM | POA: Insufficient documentation

## 2023-07-18 DIAGNOSIS — B3731 Acute candidiasis of vulva and vagina: Secondary | ICD-10-CM

## 2023-07-18 DIAGNOSIS — T304 Corrosion of unspecified body region, unspecified degree: Secondary | ICD-10-CM | POA: Diagnosis not present

## 2023-07-18 DIAGNOSIS — N9089 Other specified noninflammatory disorders of vulva and perineum: Secondary | ICD-10-CM

## 2023-07-18 DIAGNOSIS — Z1331 Encounter for screening for depression: Secondary | ICD-10-CM | POA: Diagnosis not present

## 2023-07-18 NOTE — Progress Notes (Signed)
Virtual Visit Consent   Casey [name], you are scheduled for a virtual visit with a  provider today. Just as with appointments in the office, your consent must be obtained to participate. Your consent will be active for this visit and any virtual visit you may have with one of our providers in the next 365 days. If you have a MyChart account, a copy of this consent can be sent to you electronically.  As this is a virtual visit, video technology does not allow for your provider to perform a traditional examination. This may limit your provider's ability to fully assess your condition. If your provider identifies any concerns that need to be evaluated in person or the need to arrange testing (such as labs, EKG, etc.), we will make arrangements to do so. Although advances in technology are sophisticated, we cannot ensure that it will always work on either your end or our end. If the connection with a video visit is poor, the visit may have to be switched to a telephone visit. With either a video or telephone visit, we are not always able to ensure that we have a secure connection.  By engaging in this virtual visit, you consent to the provision of healthcare and authorize for your insurance to be billed (if applicable) for the services provided during this visit. Depending on your insurance coverage, you may receive a charge related to this service.  I need to obtain your verbal consent now. Are you willing to proceed with your visit today? Casey [name] has provided verbal consent on 07/18/2023 for a virtual visit (video or telephone). Viviano Simas, FNP  Date: 07/18/2023 6:45 PM  Virtual Visit via Video Note   I, Viviano Simas, connected with  Casey [name]  ([id number], 1971/02/21) on 07/18/23 at  6:45 PM EST by a video-enabled telemedicine application and verified that I am speaking with the correct person using two identifiers.  Location: Patient: Virtual Visit Location Patient:  Home Provider: Virtual Visit Location Provider: Home Office   I discussed the limitations of evaluation and management by telemedicine and the availability of in person appointments. The patient expressed understanding and agreed to proceed.    History of Present Illness: Casey [name] is a 52 y.o. who identifies as a female who was assigned female at birth, and is being seen today for redness after facial waxing   She had waxing done 07/16/23 She has had waxing done in the past without severe reactions  She may get red for a day - but this time she has suffered sever redness with skin pealing   She has been using Aloe Vera Gel for relief  She has used cold compresses  She had a difficult time sleeping because of the pain on her face.   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   Coronary artery disease moderate, hemodynamically insignificant proximal LAD based on coronary CT angio from 2024 12/28/2022   Precordial chest pain 10/13/2022   Hypercholesterolemia 10/07/2022   Hiatal hernia 10/07/2022   Esophageal candidiasis (  HCC) 06/26/2022   Urinary tract infection, site not specified 06/26/2022   Ureteral stone 01/20/2022   Encounter for monitoring immunomodulating therapy 11/20/2021   Rheumatoid arthritis of multiple sites with 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 factor (HCC) 11/16/2021   Pyelonephritis 09/25/2021   De Quervain's tenosynovitis, left 07/31/2021   Contusion of left wrist 07/08/2021   Status post placement of ureteral stent 02/06/2021   Proteinuria 08/12/2020   Environmental  allergies 08/12/2020   History of kidney stones 08/12/2020   Tooth infection 08/12/2020   Gastroesophageal reflux disease with esophagitis without hemorrhage 08/01/2019   Slow transit constipation 08/01/2019   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   History of colon polyps 06/19/2019   Irregular astigmatism of both eyes 10/11/2018   Nephrolithiasis 09/08/2018   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   Plantar wart 11/04/2016   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   Myopia with astigmatism and presbyopia 05/07/2016   Hematochezia 03/18/2016   Overactive bladder 11/18/2015   Incontinence 10/07/2015   Albuminuria 06/27/2015   Hypertension, essential, benign 06/27/2015   Microhematuria 06/27/2015   Hematuria 06/18/2015   Cataract 05/06/2015   Dry eyes 05/06/2015   Crohn's disease (HCC) 04/21/2015   Osteoarthritis of both knees 04/21/2015   Right knee pain 04/21/2015   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/30/2014   History of abnormal cervical Pap smear 11/29/2014   Fibrositis 08/28/2014   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   Migraine without aura and without status migrainosus, not intractable 08/28/2014   Migraine 08/28/2014   Seronegative inflammatory arthritis 08/28/2014   Increased urinary frequency 07/16/2013   Dysfunction of both eustachian tubes 08/31/2012   Asthma 10/30/2008   Mild intermittent asthma without complication 10/30/2008    Allergies:  Allergies  Allergen Reactions   Albuterol Sulfate Shortness Of Breath    Other reaction(s): Other (See Comments)  can't breathe  Other reaction(s): Other (See Comments) can't breathe   Tape     Other reaction(s): Other (See Comments), Other (See Comments), Other (See Comments)  severe skin breakdown  Cast and Bandage Cover  severe skin breakdown  severe skin breakdown   Formoterol Other (See Comments)    Other reaction(s): Headaches  Other reaction(s): Headaches  Other reaction(s):  Headaches  Headaches  Other reaction(s): Headaches Other reaction(s): Headaches    Other reaction(s): Headaches    Headaches   Hydrocodone Rash   Hydrocodone-Acetaminophen Rash   Nickel Rash and Other (See Comments)   Other Rash    cast and bandage use  cast and bandage use  cast and bandage use  cast and bandage use    cast and bandage use cast and bandage use   Oxycodone-Acetaminophen Other (See Comments)    headache   Salmeterol Rash   Strawberry Extract Hives    Full body rash from strawberry   Advair Hfa [Fluticasone-Salmeterol]     unknown   Proventil Hfa [Albuterol]     unknown   Asa [Aspirin] Rash   Cleocin [Clindamycin] Rash   Clindamycin/Lincomycin Rash   Fludrocortisone Acetate Rash   Gabapentin Rash   Hydrochlorothiazide Rash   Imipramine Rash   Ipratropium Bromide Rash   Medroxyprogesterone Rash   Meloxicam Rash   Metformin And Related Rash   Nystatin Rash   Penicillins Rash   Pred Forte [Prednisolone] Rash   Prednisone Rash   Solu-Medrol [Methylprednisolone] Rash   Sulfa Antibiotics Rash   Tegaderm Ag Mesh [Silver] Rash   Toradol [Ketorolac Tromethamine] Rash   Tramadol Rash   Medications:  Current Outpatient  Medications:    acetaminophen (TYLENOL) 500 MG tablet, Take 1,000 mg by mouth every 12 (twelve) hours as needed for mild pain or moderate pain., Disp: , Rfl:    albuterol (PROVENTIL) (2.5 MG/3ML) 0.083% nebulizer solution, Take 3 mLs (2.5 mg total) by nebulization every 6 (six) hours as needed for wheezing or shortness of breath. (Patient not taking: Reported on 07/18/2023), Disp: 150 mL, Rfl: 1   amLODipine (NORVASC) 5 MG tablet, Take 1 tablet (5 mg total) by mouth daily., Disp: 30 tablet, Rfl: 2   atorvastatin (LIPITOR) 80 MG tablet, Take 1 tablet (80 mg total) by mouth daily., Disp: 30 tablet, Rfl: 2   B Complex-C (B-COMPLEX WITH VITAMIN C) tablet, Take 1 tablet by mouth daily., Disp: , Rfl:    Cholecalciferol (VITAMIN D3) 50 MCG (2000  UT) TABS, Take 1 tablet by mouth daily., Disp: , Rfl:    EPINEPHrine (EPIPEN 2-PAK) 0.3 mg/0.3 mL IJ SOAJ injection, Inject 0.3 mg into the muscle as needed for anaphylaxis., Disp: 2 each, Rfl: 2   esomeprazole (NEXIUM) 40 MG capsule, Take 1 capsule (40 mg total) by mouth 2 (two) times daily before a meal. (Patient taking differently: Take 40 mg by mouth daily.), Disp: 60 capsule, Rfl: 11   famotidine (PEPCID) 40 MG tablet, Take 1 tablet (40 mg total) by mouth daily., Disp: 30 tablet, Rfl: 11   fluconazole (DIFLUCAN) 100 MG tablet, Take 1 tablet (100 mg total) by mouth daily., Disp: 7 tablet, Rfl: 0   Golimumab (SIMPONI ARIA IV), Inject 200 mg into the vein every 8 (eight) weeks., Disp: , Rfl:    levocetirizine (XYZAL) 5 MG tablet, Take 10 mg by mouth every evening., Disp: , Rfl:    linaclotide (LINZESS) 72 MCG capsule, Take 144 mcg by mouth daily before breakfast., Disp: , Rfl:    mirabegron ER (MYRBETRIQ) 50 MG TB24 tablet, Take 1 tablet (50 mg total) by mouth daily., Disp: 30 tablet, Rfl: 11   montelukast (SINGULAIR) 10 MG tablet, Take 1 tablet (10 mg total) by mouth at bedtime. (Patient taking differently: Take 10 mg by mouth daily.), Disp: 30 tablet, Rfl: 11   nitroGLYCERIN (NITROSTAT) 0.4 MG SL tablet, Place 1 tablet (0.4 mg total) under the tongue every 5 (five) minutes as needed for chest pain., Disp: 25 tablet, Rfl: 6   OXYGEN, Inhale 2 L into the lungs at bedtime., Disp: , Rfl:    ticagrelor (BRILINTA) 90 MG TABS tablet, Take 1 tablet (90 mg total) by mouth 2 (two) times daily., Disp: 60 tablet, Rfl: 5   ZOLMitriptan (ZOMIG) 2.5 MG tablet, Take 1 tablet (2.5 mg total) by mouth once for 1 dose. May repeat in 2 hours if headache persists or recurs. (Patient taking differently: Take 2.5 mg by mouth daily as needed for migraine.), Disp: 10 tablet, Rfl: 1  Observations/Objective: Patient is well-developed, well-nourished in no acute distress.  Resting comfortably  at home.  Head is  normocephalic, atraumatic.  No labored breathing.  Speech is clear and coherent with logical content.  Patient is alert and oriented at baseline.    Assessment and Plan:  1. Chemical burn  Continue ointment based moisturizer only try small areas prior to covering the entire  Continue allergy regimen of Xyzal daily      May visit UC if redness or swelling persists or worsens as she can tolerated steroid injections but not oral intake   Follow Up Instructions: I discussed the assessment and treatment plan with the patient. The  patient was provided an opportunity to ask questions and all were answered. The patient agreed with the plan and demonstrated an understanding of the instructions.  A copy of instructions were sent to the patient via MyChart unless otherwise noted below.    The patient was advised to call back or seek an in-person evaluation if the symptoms worsen or if the condition fails to improve as anticipated.    Viviano Simas, FNP

## 2023-07-18 NOTE — Progress Notes (Signed)
GYNECOLOGY OFFICE VISIT NOTE  History:   Casey [name] is a 52 y.o. G0P0000 here today for re-evaluation of vulvar irritation. Was seen by PCP on 07/14/23, diagnosed with yeast infection and prescribed Diflucan 100 mg po every day x 7 days.  Symptoms improving. She denies any abnormal vaginal discharge, bleeding, pelvic pain or other concerns.    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 Atopic dermatitis Formatting of this note might be different from the original. Formatting of this note might be different from the original. Atopic dermatitis Formatting of this note might be different from the original. Formatting of this note might be different from the original. Formatting of this note might be different from the or   Cataract 05/06/2015   Contusion of left wrist 07/08/2021   Crohn's disease (HCC) 04/21/2015   De Quervain's tenosynovitis, left 07/31/2021   Diabetes mellitus without complication (HCC)    Diabetes type 2, controlled (HCC) 04/21/2015   Dry eyes 05/06/2015   DVT (deep venous thrombosis) (HCC)    Dysfunction of both eustachian tubes 08/31/2012   Encounter for monitoring immunomodulating therapy 11/20/2021   Environmental allergies 08/12/2020   Esophageal candidiasis (HCC) 06/26/2022   Fibrositis 08/28/2014   Formatting of this note might be different from the original. Fibromyalgia Formatting of this note might be different from the original. Formatting of this note might be different from the original. Fibromyalgia   Flank pain 11/24/2020   Gastroesophageal reflux disease with esophagitis without hemorrhage 08/01/2019   Formatting of this note might be different from the original.  Formatting of this note might be different from the original. 07/2019: chronic symptoms of esophageal reflux since prior to sleeve gastrectomy surgery. Symptoms have worsened over the last 6 months with recent EGD findings of grade B esophagitis, irregular z line, erythematous mucosa, and evidence of sleeve gastrectomy. 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 Formatting of this note might be different from the original. 07/2019: 8 mm colonic polyp removed during colonoscopy, biopsy showed serrated polyp. Repeat colonoscopy recommended in one year. Formatting of this note might be different from the original. Added automatically from request for surgery [phone] Formatting of this note might b   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   History of kidney stones 08/12/2020   Hypercholesterolemia 10/07/2022   Hypertension, essential, benign 06/27/2015   Hypertensive disorder 08/28/2014   Formatting of this note might be different from the original. Hypertension Formatting of this note might be different from the original. Formatting of this note might be different from the original. 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 Keratoconus Formatting of this note might be different from the original. Formatting of this note might be different from the original. Keratoconus Formatting of this note might be different from the original. Formatting of  this note might be different from the original. Keratoconus Formatting of this note might be different from the or   Microhematuria 06/27/2015    Migraine 08/28/2014   Formatting of this note might be different from the original. Migraine Formatting of this note might be different from the original. Formatting of this note might be different from the original. Migraine Formatting of this note might be different from the original. Migraine Formatting of this note might be different from the original. Migraine Formatting of this note might be different from the or   Migraine without aura and without status migrainosus, not intractable 08/28/2014   Formatting of this note might be different from the original. Formatting of this note might be different from the original. Formatting of this note might be different from the original. Migraine Formatting of this note might be different from the original. Migraine Formatting of this note might be different from the original. Formatting of this note might be different from the original. Migraine F   Mild intermittent asthma without complication 10/30/2008   Formatting of this note might be different from the original. Formatting of this note might be different from the original. Formatting of this note might be different from the original. Asthma Formatting of this note might be different from the original. Asthma Formatting of this note might be different from the original. Formatting of this note might be different from the original. 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 Formatting of this note might be different from the original. 01/25/2016 with Dr. Dionisio David at [facility]. [place] of this note might be different from the original. 01/25/2016 with Dr. Dionisio David at [facility]. [place] of this note might be different from the original. Formatting of this note might be different from    Seronegative inflammatory arthritis 08/28/2014   Formatting of this note might be different from the original. Formatting of this note might be different from the original. Arthritis; diagnosed as IBD related - 2012 Formatting of this note might be different from the original. Followed by rheumatologist. Had to change providers due to an insurance change earlier this year resulting in patient not being able to have Remicade for ~6 months. 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 Formatting of this note might be different from the original. Treated with PRN miralax. Recent colonoscopy 07/2019. Formatting of this note might be different from the original. Treated with PRN miralax. Recent colonoscopy 07/2019. Formatting of this note might be different from the original. Formatting of this note might be different f   Status post placement of ureteral stent 02/06/2021   Tooth infection 08/12/2020   Ureteral stone 01/20/2022   Urinary tract infection, site not specified 06/26/2022    Past Surgical History:  Procedure Laterality  Date   BACK SURGERY     L5-S1   CERVICAL CONE BIOPSY  04/2000   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   CORONARY STENT INTERVENTION N/A 07/01/2023   Procedure: CORONARY STENT INTERVENTION;  Surgeon: Iran Ouch, MD;  Location: [facility];  Service: Cardiovascular;  Laterality: N/A;    EYE SURGERY     GASTRIC BYPASS  2017   Sleve   LAPAROSCOPIC GASTRIC SLEEVE RESECTION     LEFT HEART CATH AND CORONARY ANGIOGRAPHY N/A 07/01/2023   Procedure: LEFT HEART CATH AND CORONARY ANGIOGRAPHY;  Surgeon: Iran Ouch, MD;  Location: [facility];  Service: Cardiovascular;  Laterality: N/A;   Low back disc surgery     06/2000   TOTAL KNEE ARTHROPLASTY Right 11/2016    The following portions of the patient's history were reviewed and updated as appropriate: allergies, current medications, past family history, past medical history, past social history, past surgical history and problem list.   Health Maintenance:  No recent pap, history of LEEP and [facility] procedures.  Normal mammogram on 12/20/2022.   Review of Systems:  Pertinent items noted in HPI and remainder of comprehensive ROS otherwise negative.  Physical Exam:  BP 120/87   Pulse 89   Wt 221 lb 4 oz (100.4 kg)   BMI 39.19 kg/m  CONSTITUTIONAL: Well-developed, well-nourished female in no acute distress.  MUSCULOSKELETAL: Normal range of motion. No edema noted. NEUROLOGIC: Alert and oriented to person, place, and time. Normal muscle tone coordination. No cranial nerve deficit noted. PSYCHIATRIC: Normal mood and affect. Normal behavior. Normal judgment and thought content. CARDIOVASCULAR: Normal heart rate noted RESPIRATORY: Effort and breath sounds normal, no problems with respiration noted ABDOMEN: No masses noted. No other overt distention noted.   PELVIC: Mild diffuse erythema on labia otherwise normal appearing external genitalia; atrophic urethral meatus; atrophic vaginal mucosa and very atrophic cervix that is very small in appearance and palpation.  Pap smear obtained.  Thin discharge noted.  Not able to palpated uterus or adnexa secondary to habitus. Performed in the presence of a chaperone  Labs and Imaging Results for orders placed or performed in visit on 07/14/23 (from the past 168 hour(s))   Cervicovaginal ancillary only( [place])   Collection Time: 07/14/23  1:41 PM  Result Value Ref Range   Neisseria Gonorrhea Negative    Chlamydia Negative    Trichomonas Negative    Bacterial Vaginitis (gardnerella) Negative    Candida Vaginitis Positive (A)    Candida Glabrata Negative    Comment Normal Reference Range Candida Species - Negative    Comment Normal Reference Range Candida Galbrata - Negative    Comment Normal Reference Range Trichomonas - Negative    Comment Normal Reference [place] Chlamydia - Negative    Comment      Normal Reference Range Neisseria Gonorrhea - Negative   Comment      Normal Reference Range Bacterial Vaginosis - Negative  Results for orders placed or performed in visit on 07/14/23 (from the past 168 hour(s))  POCT Urinalysis Dipstick   Collection Time: 07/14/23  1:35 PM  Result Value Ref Range   Color, UA Dark Yellow    Clarity, UA Cloudy    Glucose, UA Negative Negative   Bilirubin, UA Small    Ketones, UA Negative    Spec Grav, UA 1.025 1.010 - 1.025   Blood, UA Moderate    pH, UA 6.0 5.0 - 8.0   Protein, UA Positive (A) Negative  Urobilinogen, UA 0.2 0.2 or 1.0 E.U./dL   Nitrite, UA Negative    Leukocytes, UA Negative Negative   Appearance     Odor    Urine Culture   Collection Time: 07/14/23  1:36 PM   Specimen: Urine  Result Value Ref Range   MICRO NUMBER: [id number]    SPECIMEN QUALITY: Adequate    Sample Source NOT GIVEN    STATUS: FINAL    Result:      Less than 10,000 CFU/mL of single Gram positive organism isolated. No further testing will be performed. If clinically indicated, recollection using a method to minimize contamination, with prompt transfer to Urine Culture Transport Tube, is recommended.       Assessment and Plan:     1. Vulvar irritation 2. Yeast infection involving the vagina and surrounding area Continue medication as prescribed.  If symptoms continue, may need prolonged Diflucan therapy.  She is  allergic to Nystatin cream.  Proper vulvar hygiene emphasized: discussed avoidance of perfumed soaps, detergents, lotions and any type of douches; in addition to wearing cotton underwear and no underwear at night.  Also recommended cleaning front to back, voiding and cleaning up after intercourse.    3. Pap smear for cervical cancer screening - Cytology - PAP done, likely will be insufficient given atrophic cervix. Will follow up results and manage accordingly.  Routine preventative health maintenance measures emphasized. Please refer to After Visit Summary for other counseling recommendations.   Return for any gynecologic concerns.    I spent 30 minutes dedicated to the care of this patient including pre-visit review of records, face to face time with the patient discussing her conditions and treatments and post visit orders.    Jaynie Collins, MD, FACOG Obstetrician & Gynecologist, [facility] for [employer], [facility]

## 2023-07-19 ENCOUNTER — Telehealth (HOSPITAL_COMMUNITY): Payer: Self-pay

## 2023-07-19 NOTE — Telephone Encounter (Signed)
Pt called wanting to schedule for cardiac rehab, pt will come in for orientation on 12/12@1030  and will attend the 1:45 exercise class time.   Sent letter

## 2023-07-20 LAB — CYTOLOGY - PAP
Comment: NEGATIVE
Diagnosis: NEGATIVE
Diagnosis: REACTIVE
High risk HPV: NEGATIVE

## 2023-07-21 ENCOUNTER — Encounter (HOSPITAL_COMMUNITY)
Admission: RE | Admit: 2023-07-21 | Discharge: 2023-07-21 | Disposition: A | Discharge status: Home / Self Care | Payer: 59 | Source: Ambulatory Visit | Attending: Cardiology | Admitting: Cardiology

## 2023-07-21 VITALS — BP 136/82 | HR 79 | Ht 63.0 in | Wt 221.6 lb

## 2023-07-21 DIAGNOSIS — Z955 Presence of coronary angioplasty implant and graft: Secondary | ICD-10-CM | POA: Insufficient documentation

## 2023-07-21 NOTE — Progress Notes (Signed)
Patient reports having chest pain 2 days ago while taking wet heavy laundry out of the washing machine. Patient took a sublingual nitroglycerin with relief. Denies having any chest pain today. Deb reports that she sometimes feels lightheaded when she bends over. Asymptomatic today. Sitting blood pressure 118/78. Standing blood pressure 132/86. Heart rate 75. Telemetry rhythm Sinus. Oxygen saturation 98% on room air. Will notify onsite provider Joni Reining DNP.   Onsite provider Joni Reining notified.  Per Honor Loh DNP Yes go ahead   Will continue to monitor and will let Dr Vanetta Shawl office know.Thayer Headings RN BSN

## 2023-07-21 NOTE — Progress Notes (Signed)
Cardiac Rehab Medication Review by a Nurse  Does the patient  feel that his/her medications are working for him/her?  yes  Has the patient been experiencing any side effects to the medications prescribed?  yes  Does the patient measure his/her own blood pressure or blood glucose at home?  no   Does the patient have any problems obtaining medications due to transportation or finances?   no  Understanding of regimen: excellent Understanding of indications: excellent Potential of compliance: excellent    Nurse comments: Casey [name] says she is taking her medications as prescribed. Casey [name] says that she has chronic diarrhea recently. Casey [name] says she will discuss with her GI doctor. Casey [name] does not have a blood pressure cuff. Casey [name] says that she cannot afford to buy one. Casey [name] says she will contact her insurance provider to see if she can get assistance with getting a  blood pressure cuff/monitor.     Arta Bruce [facility] RN 07/21/2023 10:52 AM

## 2023-07-21 NOTE — Progress Notes (Signed)
Cardiac Individual Treatment Plan  Patient Details  Name: Casey [name] MRN: [id number] Date of Birth: [birth year] Referring Provider:   Flowsheet Row INTENSIVE CARDIAC REHAB ORIENT from 07/21/2023 in [facility] for Heart, Vascular, & Lung [facility]  Referring Provider Gypsy Balsam, MD       Initial Encounter Date:  Flowsheet Row INTENSIVE CARDIAC REHAB ORIENT from 07/21/2023 in [facility] for Heart, Vascular, & Lung Health  Date 07/21/23       Visit Diagnosis: 07/01/23 DES LAD  Patient's Home Medications on Admission:  Current Outpatient Medications:    acetaminophen (TYLENOL) 500 MG tablet, Take 1,000 mg by mouth every 12 (twelve) hours as needed for mild pain or moderate pain., Disp: , Rfl:    albuterol (PROVENTIL) (2.5 MG/3ML) 0.083% nebulizer solution, Take 3 mLs (2.5 mg total) by nebulization every 6 (six) hours as needed for wheezing or shortness of breath., Disp: 150 mL, Rfl: 1   amLODipine (NORVASC) 5 MG tablet, Take 1 tablet (5 mg total) by mouth daily., Disp: 30 tablet, Rfl: 2   atorvastatin (LIPITOR) 80 MG tablet, Take 1 tablet (80 mg total) by mouth daily., Disp: 30 tablet, Rfl: 2   B Complex-C (B-COMPLEX WITH VITAMIN C) tablet, Take 1 tablet by mouth daily., Disp: , Rfl:    Cholecalciferol (VITAMIN D3) 50 MCG (2000 UT) TABS, Take 1 tablet by mouth daily., Disp: , Rfl:    EPINEPHrine (EPIPEN 2-PAK) 0.3 mg/0.3 mL IJ SOAJ injection, Inject 0.3 mg into the muscle as needed for anaphylaxis., Disp: 2 each, Rfl: 2   esomeprazole (NEXIUM) 40 MG capsule, Take 1 capsule (40 mg total) by mouth 2 (two) times daily before a meal. (Patient taking differently: Take 40 mg by mouth daily.), Disp: 60 capsule, Rfl: 11   famotidine (PEPCID) 40 MG tablet, Take 1 tablet (40 mg total) by mouth daily., Disp: 30 tablet, Rfl: 11   fluconazole (DIFLUCAN) 100 MG tablet, Take 1 tablet (100 mg total) by mouth daily., Disp: 7 tablet, Rfl: 0   Golimumab  (SIMPONI ARIA IV), Inject 200 mg into the vein every 8 (eight) weeks., Disp: , Rfl:    levocetirizine (XYZAL) 5 MG tablet, Take 10 mg by mouth every evening., Disp: , Rfl:    linaclotide (LINZESS) 72 MCG capsule, Take 144 mcg by mouth daily before breakfast., Disp: , Rfl:    mirabegron ER (MYRBETRIQ) 50 MG TB24 tablet, Take 1 tablet (50 mg total) by mouth daily., Disp: 30 tablet, Rfl: 11   montelukast (SINGULAIR) 10 MG tablet, Take 1 tablet (10 mg total) by mouth at bedtime. (Patient taking differently: Take 10 mg by mouth daily.), Disp: 30 tablet, Rfl: 11   nitroGLYCERIN (NITROSTAT) 0.4 MG SL tablet, Place 1 tablet (0.4 mg total) under the tongue every 5 (five) minutes as needed for chest pain., Disp: 25 tablet, Rfl: 6   OXYGEN, Inhale 2 L into the lungs at bedtime., Disp: , Rfl:    ticagrelor (BRILINTA) 90 MG TABS tablet, Take 1 tablet (90 mg total) by mouth 2 (two) times daily., Disp: 60 tablet, Rfl: 5   ZOLMitriptan (ZOMIG) 2.5 MG tablet, Take 1 tablet (2.5 mg total) by mouth once for 1 dose. May repeat in 2 hours if headache persists or recurs. (Patient taking differently: Take 2.5 mg by mouth daily as needed for migraine.), Disp: 10 tablet, Rfl: 1  Past Medical History: Past Medical History:  Diagnosis Date   Albuminuria 06/27/2015   Anxiety 11/04/2014  Arm DVT (deep venous thromboembolism), acute, left (HCC) 08/17/2022   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/30/2014   Asthma    Atopic dermatitis 10/30/2008   Formatting of this note might be different from the original. Atopic dermatitis Formatting of this note might be different from the original. Formatting of this note might be different from the original. Atopic dermatitis Formatting of this note might be different from the original. Formatting of this note might be different from the original. Formatting of this note might be different from the or   Cataract 05/06/2015   Contusion of left wrist 07/08/2021   Crohn's  disease (HCC) 04/21/2015   De Quervain's tenosynovitis, left 07/31/2021   Diabetes mellitus without complication (HCC)    Diabetes type 2, controlled (HCC) 04/21/2015   Dry eyes 05/06/2015   DVT (deep venous thrombosis) (HCC)    Dysfunction of both eustachian tubes 08/31/2012   Encounter for monitoring immunomodulating therapy 11/20/2021   Environmental allergies 08/12/2020   Esophageal candidiasis (HCC) 06/26/2022   Fibrositis 08/28/2014   Formatting of this note might be different from the original. Fibromyalgia Formatting of this note might be different from the original. Formatting of this note might be different from the original. Fibromyalgia   Flank pain 11/24/2020   Gastroesophageal reflux disease with esophagitis without hemorrhage 08/01/2019   Formatting of this note might be different from the original. Formatting of this note might be different from the original. 07/2019: chronic symptoms of esophageal reflux since prior to sleeve gastrectomy surgery. Symptoms have worsened over the last 6 months with recent EGD findings of grade B esophagitis, irregular z line, erythematous mucosa, and evidence of sleeve gastrectomy. 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 Formatting of this note might be different from the original. 07/2019: 8 mm colonic polyp removed during colonoscopy, biopsy showed serrated polyp. Repeat colonoscopy recommended in one year. Formatting of this note might be different from the original. Added automatically from request for surgery [phone] Formatting of this note might b   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   History of kidney stones 08/12/2020   Hypercholesterolemia 10/07/2022   Hypertension, essential, benign  06/27/2015   Hypertensive disorder 08/28/2014   Formatting of this note might be different from the original. Hypertension Formatting of this note might be different from the original. Formatting of this note might be different from the original. 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 Keratoconus Formatting of this note might be different from the original. Formatting of this note might be different from the original. Keratoconus Formatting of this note might be different from the original. Formatting of this note might be different from the original. Keratoconus Formatting of this note might be different from the or   Microhematuria 06/27/2015   Migraine 08/28/2014   Formatting of this note might be different from the original. Migraine Formatting of this note might be different from the original. Formatting of this note might be different from the original. Migraine Formatting of this note might be different from the original. Migraine Formatting of this note might be  different from the original. Migraine Formatting of this note might be different from the or   Migraine without aura and without status migrainosus, not intractable 08/28/2014   Formatting of this note might be different from the original. Formatting of this note might be different from the original. Formatting of this note might be different from the original. Migraine Formatting of this note might be different from the original. Migraine Formatting of this note might be different from the original. Formatting of this note might be different from the original. Migraine F   Mild intermittent asthma without complication 10/30/2008   Formatting of this note might be different from the original. Formatting of this note might be different from the original.  Formatting of this note might be different from the original. Asthma Formatting of this note might be different from the original. Asthma Formatting of this note might be different from the original. Formatting of this note might be different from the original. 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 Formatting of this note might be different from the original. 01/25/2016 with Dr. Dionisio David at [facility]. [place] of this note might be different from the original. 01/25/2016 with Dr. Dionisio David at [facility]. [place] of this note might be different from the original. Formatting of this note might be different from    Seronegative inflammatory arthritis 08/28/2014   Formatting of this note might be different from the original. Formatting of this note might be different from the original. Arthritis; diagnosed as IBD related - 2012 Formatting of this note might be different from the original. Followed by rheumatologist. Had to change providers due to an insurance change earlier this year resulting in patient not being able to have Remicade for ~6 months. 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 Formatting of this  note might be different from the original. Treated with PRN miralax. Recent colonoscopy 07/2019. Formatting of this note might be different from the original. Treated with PRN miralax. Recent colonoscopy 07/2019. Formatting of this note might be different from the original. Formatting of this note might be different f   Status post placement of ureteral stent 02/06/2021   Tooth infection 08/12/2020   Ureteral stone 01/20/2022   Urinary tract infection, site not specified 06/26/2022    Tobacco Use: Social History   Tobacco Use  Smoking Status Never   Passive exposure: Never  Smokeless Tobacco Never    Labs: Review Flowsheet       Latest Ref Rng & Units 11/30/2022 06/01/2023  Labs for ITP Cardiac and Pulmonary Rehab  Cholestrol 100 - 199 mg/dL - 657   LDL (calc) 0 - 99 mg/dL - 94   HDL-C >84 mg/dL - 58   Trlycerides 0 - 149 mg/dL - 696  Hemoglobin A1c 4.6 - 6.5 % 5.8  -    Capillary Blood Glucose: Lab Results  Component Value Date   GLUCAP 125 (H) 06/30/2023   GLUCAP 113 (H) 06/29/2023     Exercise Target Goals: Exercise Program Goal: Individual exercise prescription set using results from initial 6 min walk test and THRR while considering  patient's activity barriers and safety.   Exercise Prescription Goal: Initial exercise prescription builds to 30-45 minutes a day of aerobic activity, 2-3 days per week.  Home exercise guidelines will be given to patient during program as part of exercise prescription that the participant will acknowledge.  Activity Barriers & Risk Stratification:  Activity Barriers & Cardiac Risk Stratification - 07/21/23 1050       Activity Barriers & Cardiac Risk Stratification   Activity Barriers Right Knee Replacement;Neck/Spine Problems;Balance Concerns;Chest Pain/Angina;Shortness of Breath;Deconditioning;Joint Problems;Arthritis;Back Problems    Cardiac Risk Stratification High   <5 METs on            6 Minute Walk:  6 Minute  Walk     Row Name 07/21/23 1317         6 Minute Walk   Phase Initial     Distance 880 feet     Walk Time 6 minutes     # of Rest Breaks 1  5:00-6:00 due to L hip pain     MPH 1.67     METS 2.62     RPE 11     Perceived Dyspnea  0     VO2 Peak 9.17     Symptoms Yes (comment)     Comments L hip pain chronic 5/10, dizziness - resolved with rest     Resting HR 79 bpm     Resting BP 136/82     Resting Oxygen Saturation  98 %     Exercise Oxygen Saturation  during 6 min walk 98 %     Max Ex. HR 117 bpm     Max Ex. BP 142/86     2 Minute Post BP 128/74              Oxygen Initial Assessment:   Oxygen Re-Evaluation:   Oxygen Discharge (Final Oxygen Re-Evaluation):   Initial Exercise Prescription:  Initial Exercise Prescription - 07/21/23 1300       Date of Initial Exercise RX and Referring Provider   Date 07/21/23    Referring Provider Gypsy Balsam, MD    Expected Discharge Date 10/12/23      NuStep   Level 1    SPM 60    Minutes 15    METs 2      Recumbant Elliptical   Level 1    RPM 50    Watts 60    Minutes 15    METs 2      Prescription Details   Frequency (times per week) 3    Duration Progress to 30 minutes of continuous aerobic without signs/symptoms of physical distress      Intensity   THRR 40-80% of Max Heartrate 66-133    Ratings of Perceived Exertion 11-13    Perceived Dyspnea 0-4      Progression   Progression Continue progressive overload as per policy without signs/symptoms or physical distress.      Resistance Training   Training Prescription Yes    Weight 2    Reps 10-15             Perform Capillary Blood Glucose checks  as needed.  Exercise Prescription Changes:   Exercise Comments:   Exercise Goals and Review:   Exercise Goals     Row Name 07/21/23 0955             Exercise Goals   Increase Physical Activity Yes       Intervention Provide advice, education, support and counseling about physical  activity/exercise needs.;Develop an individualized exercise prescription for aerobic and resistive training based on initial evaluation findings, risk stratification, comorbidities and participant's personal goals.       Expected Outcomes Short Term: Attend rehab on a regular basis to increase amount of physical activity.;Long Term: Exercising regularly at least 3-5 days a week.;Long Term: Add in home exercise to make exercise part of routine and to increase amount of physical activity.       Increase Strength and Stamina Yes       Intervention Develop an individualized exercise prescription for aerobic and resistive training based on initial evaluation findings, risk stratification, comorbidities and participant's personal goals.;Provide advice, education, support and counseling about physical activity/exercise needs.       Expected Outcomes Short Term: Increase workloads from initial exercise prescription for resistance, speed, and METs.;Short Term: Perform resistance training exercises routinely during rehab and add in resistance training at home;Long Term: Improve cardiorespiratory fitness, muscular endurance and strength as measured by increased METs and functional capacity ( )       Able to understand and use rate of perceived exertion (RPE) scale Yes       Intervention Provide education and explanation on how to use RPE scale       Expected Outcomes Short Term: Able to use RPE daily in rehab to express subjective intensity level;Long Term:  Able to use RPE to guide intensity level when exercising independently       Able to understand and use Dyspnea scale Yes       Intervention Provide education and explanation on how to use Dyspnea scale       Expected Outcomes Short Term: Able to use Dyspnea scale daily in rehab to express subjective sense of shortness of breath during exertion;Long Term: Able to use Dyspnea scale to guide intensity level when exercising independently       Knowledge and  understanding of Target Heart Rate Range (THRR) Yes       Intervention Provide education and explanation of THRR including how the numbers were predicted and where they are located for reference       Expected Outcomes Short Term: Able to state/look up THRR;Short Term: Able to use daily as guideline for intensity in rehab;Long Term: Able to use THRR to govern intensity when exercising independently  astham; sleeps with 2L O2 at night       Understanding of Exercise Prescription Yes       Intervention Provide education, explanation, and written materials on patient's individual exercise prescription       Expected Outcomes Short Term: Able to explain program exercise prescription;Long Term: Able to explain home exercise prescription to exercise independently                Exercise Goals Re-Evaluation :   Discharge Exercise Prescription (Final Exercise Prescription Changes):   Nutrition:  Target Goals: Understanding of nutrition guidelines, daily intake of sodium 1500mg , cholesterol 200mg , calories 30% from fat and 7% or less from saturated fats, daily to have 5 or more servings of fruits and vegetables.  Biometrics:  Pre Biometrics - 07/21/23 0454  Pre Biometrics   Waist Circumference 52 inches    Hip Circumference 54 inches    Waist to Hip Ratio 0.96 %    Triceps Skinfold 30 mm    % Body Fat 50.9 %    Grip Strength 24 kg    Flexibility 0 in   not done- low back issues   Single Leg Stand 2 seconds              Nutrition Therapy Plan and Nutrition Goals:   Nutrition Assessments:  MEDIFICTS Score Key: >=70 Need to make dietary changes  40-70 Heart Healthy Diet <= 40 Therapeutic Level Cholesterol Diet    Picture Your Plate Scores: <40 Unhealthy dietary pattern with much room for improvement. 41-50 Dietary pattern unlikely to meet recommendations for good health and room for improvement. 51-60 More healthful dietary pattern, with some room for improvement.   >60 Healthy dietary pattern, although there may be some specific behaviors that could be improved.    Nutrition Goals Re-Evaluation:   Nutrition Goals Re-Evaluation:   Nutrition Goals Discharge (Final Nutrition Goals Re-Evaluation):   Psychosocial: Target Goals: Acknowledge presence or absence of significant depression and/or stress, maximize coping skills, provide positive support system. Participant is able to verbalize types and ability to use techniques and skills needed for reducing stress and depression.  Initial Review & Psychosocial Screening:  Initial Psych Review & Screening - 07/21/23 1052       Initial Review   Current issues with Current Stress Concerns;Current Sleep Concerns    Source of Stress Concerns Financial;Occupation;Unable to participate in former interests or hobbies    Comments Deb shared that she has had some low stress since she has been out of work since June, she is hoping to return after completing CRP2. Being out of work has caused some financial strain, however she states she has a plan to help. Deb denies any feelings of anxiety/depression. She also shared that she has not been sleeping well and fatigue is "running her life", she relates this to her neck pain and chest pressure she experiences occassionally.      Family Dynamics   Good Support System? Yes   Deb has her husband for support     Barriers   Psychosocial barriers to participate in program The patient should benefit from training in stress management and relaxation.      Screening Interventions   Interventions Encouraged to exercise;To provide support and resources with identified psychosocial needs;Provide feedback about the scores to participant    Expected Outcomes Long Term goal: The participant improves quality of Life and PHQ9 Scores as seen by post scores and/or verbalization of changes;Short Term goal: Identification and review with participant of any Quality of Life or Depression  concerns found by scoring the questionnaire.;Long Term Goal: Stressors or current issues are controlled or eliminated.;Short Term goal: Utilizing psychosocial counselor, staff and physician to assist with identification of specific Stressors or current issues interfering with healing process. Setting desired goal for each stressor or current issue identified.             Quality of Life Scores:  Quality of Life - 07/21/23 1319       Quality of Life   Select Quality of Life      Quality of Life Scores   Health/Function Pre 20.93 %    Socioeconomic Pre 21.88 %    Psych/Spiritual Pre 24.21 %    Family Pre 27 %    GLOBAL Pre 22.54 %  Scores of 19 and below usually indicate a poorer quality of life in these areas.  A difference of  2-3 points is a clinically meaningful difference.  A difference of 2-3 points in the total score of the Quality of Life Index has been associated with significant improvement in overall quality of life, self-image, physical symptoms, and general health in studies assessing change in quality of life.  PHQ-9: Review Flowsheet       07/21/2023 07/18/2023 02/01/2023 11/30/2022  Depression screen PHQ 2/9  Decreased Interest 0 0 0 2 0  Down, Depressed, Hopeless 0 0 0 2 0  PHQ - 2 Score 0 0 0 4 0  Altered sleeping 3 3 3 1   Tired, decreased energy 3 3 3 2   Change in appetite 0 0 3 2  Feeling bad or failure about yourself  0 0 0 1  Trouble concentrating 0 1 2 2   Moving slowly or fidgety/restless 0 0 2 0  Suicidal thoughts 0 0 0 0  PHQ-9 Score 6 7 13 12   Difficult doing work/chores - - - Somewhat difficult    Details       Multiple values from one day are sorted in reverse-chronological order        Interpretation of Total Score  Total Score Depression Severity:  1-4 = Minimal depression, 5-9 = Mild depression, 10-14 = Moderate depression, 15-19 = Moderately severe depression, 20-27 = Severe depression   Psychosocial Evaluation and  Intervention:   Psychosocial Re-Evaluation:   Psychosocial Discharge (Final Psychosocial Re-Evaluation):   Vocational Rehabilitation: Provide vocational rehab assistance to qualifying candidates.   Vocational Rehab Evaluation & Intervention:  Vocational Rehab - 07/21/23 1051       Initial Vocational Rehab Evaluation & Intervention   Assessment shows need for Vocational Rehabilitation No      Vocational Rehab Re-Evaulation   Comments [name] has not worked since June due to multiple health realted issues, she is hoping to return after completing CRP2             Education: Education Goals: Education classes will be provided on a weekly basis, covering required topics. Participant will state understanding/return demonstration of topics presented.     Core Videos: Exercise    Move It!  Clinical staff conducted group or individual video education with verbal and written material and guidebook.  Patient learns the recommended Pritikin exercise program. Exercise with the goal of living a long, healthy life. Some of the health benefits of exercise include controlled diabetes, healthier blood pressure levels, improved cholesterol levels, improved heart and lung capacity, improved sleep, and better body composition. Everyone should speak with their doctor before starting or changing an exercise routine.  Biomechanical Limitations Clinical staff conducted group or individual video education with verbal and written material and guidebook.  Patient learns how biomechanical limitations can impact exercise and how we can mitigate and possibly overcome limitations to have an impactful and balanced exercise routine.  Body Composition Clinical staff conducted group or individual video education with verbal and written material and guidebook.  Patient learns that body composition (ratio of muscle mass to fat mass) is a key component to assessing overall fitness, rather than body weight alone.  Increased fat mass, especially visceral belly fat, can put [place] at increased risk for metabolic syndrome, type 2 diabetes, heart disease, and even death. It is recommended to combine diet and exercise (cardiovascular and resistance training) to improve your body composition. Seek guidance from your physician and exercise physiologist  before implementing an exercise routine.  Exercise Action Plan Clinical staff conducted group or individual video education with verbal and written material and guidebook.  Patient learns the recommended strategies to achieve and enjoy long-term exercise adherence, including variety, self-motivation, self-efficacy, and positive decision making. Benefits of exercise include fitness, good health, weight management, more energy, better sleep, less stress, and overall well-being.  Medical   Heart Disease Risk Reduction Clinical staff conducted group or individual video education with verbal and written material and guidebook.  Patient learns our heart is our most vital organ as it circulates oxygen, nutrients, white blood cells, and hormones throughout the entire body, and carries waste away. Data supports a plant-based eating plan like the Pritikin Program for its effectiveness in slowing progression of and reversing heart disease. The video provides a number of recommendations to address heart disease.   Metabolic Syndrome and Belly Fat  Clinical staff conducted group or individual video education with verbal and written material and guidebook.  Patient learns what metabolic syndrome is, how it leads to heart disease, and how one can reverse it and keep it from coming back. You have metabolic syndrome if you have 3 of the following 5 criteria: abdominal obesity, high blood pressure, high triglycerides, low HDL cholesterol, and high blood sugar.  Hypertension and Heart Disease Clinical staff conducted group or individual video education with verbal and written material and  guidebook.  Patient learns that high blood pressure, or hypertension, is very common in the [place]. Hypertension is largely due to excessive salt intake, but other important risk factors include being overweight, physical inactivity, drinking too much alcohol, smoking, and not eating enough potassium from fruits and vegetables. High blood pressure is a leading risk factor for heart attack, stroke, congestive heart failure, dementia, kidney failure, and premature death. Long-term effects of excessive salt intake include stiffening of the arteries and thickening of heart muscle and organ damage. Recommendations include ways to reduce hypertension and the risk of heart disease.  Diseases of Our Time - Focusing on Diabetes Clinical staff conducted group or individual video education with verbal and written material and guidebook.  Patient learns why the best way to stop diseases of our time is prevention, through food and other lifestyle changes. Medicine (such as prescription pills and surgeries) is often only a Band-Aid on the problem, not a long-term solution. Most common diseases of our time include obesity, type 2 diabetes, hypertension, heart disease, and cancer. The Pritikin Program is recommended and has been proven to help reduce, reverse, and/or prevent the damaging effects of metabolic syndrome.  Nutrition   Overview of the Pritikin Eating Plan  Clinical staff conducted group or individual video education with verbal and written material and guidebook.  Patient learns about the Pritikin Eating Plan for disease risk reduction. The Pritikin Eating Plan emphasizes a wide variety of unrefined, minimally-processed carbohydrates, like fruits, vegetables, whole grains, and legumes. Go, Caution, and Stop food choices are explained. Plant-based and lean animal proteins are emphasized. Rationale provided for low sodium intake for blood pressure control, low added sugars for blood sugar stabilization,  and low added fats and oils for coronary artery disease risk reduction and weight management.  Calorie Density  Clinical staff conducted group or individual video education with verbal and written material and guidebook.  Patient learns about calorie density and how it impacts the Pritikin Eating Plan. Knowing the characteristics of the food you choose will help you decide whether those foods will lead to weight  gain or weight loss, and whether you want to consume more or less of them. Weight loss is usually a side effect of the Pritikin Eating Plan because of its focus on low calorie-dense foods.  Label Reading  Clinical staff conducted group or individual video education with verbal and written material and guidebook.  Patient learns about the Pritikin recommended label reading guidelines and corresponding recommendations regarding calorie density, added sugars, sodium content, and whole grains.  Dining Out - Part 1  Clinical staff conducted group or individual video education with verbal and written material and guidebook.  Patient learns that restaurant meals can be sabotaging because they can be so high in calories, fat, sodium, and/or sugar. Patient learns recommended strategies on how to positively address this and avoid unhealthy pitfalls.  Facts on Fats  Clinical staff conducted group or individual video education with verbal and written material and guidebook.  Patient learns that lifestyle modifications can be just as effective, if not more so, as many medications for lowering your risk of heart disease. A Pritikin lifestyle can help to reduce your risk of inflammation and atherosclerosis (cholesterol build-up, or plaque, in the artery walls). Lifestyle interventions such as dietary choices and physical activity address the cause of atherosclerosis. A review of the types of fats and their impact on blood cholesterol levels, along with dietary recommendations to reduce fat intake is also  included.  Nutrition Action Plan  Clinical staff conducted group or individual video education with verbal and written material and guidebook.  Patient learns how to incorporate Pritikin recommendations into their lifestyle. Recommendations include planning and keeping personal health goals in mind as an important part of their success.  Healthy Mind-Set    Healthy Minds, Bodies, Hearts  Clinical staff conducted group or individual video education with verbal and written material and guidebook.  Patient learns how to identify when they are stressed. Video will discuss the impact of that stress, as well as the many benefits of stress management. Patient will also be introduced to stress management techniques. The way we think, act, and feel has an impact on our hearts.  How Our Thoughts Can Heal Our Hearts  Clinical staff conducted group or individual video education with verbal and written material and guidebook.  Patient learns that negative thoughts can cause depression and anxiety. This can result in negative lifestyle behavior and serious health problems. Cognitive behavioral therapy is an effective method to help control our thoughts in order to change and improve our emotional outlook.  Additional Videos:  Exercise    Improving Performance  Clinical staff conducted group or individual video education with verbal and written material and guidebook.  Patient learns to use a non-linear approach by alternating intensity levels and lengths of time spent exercising to help burn more calories and lose more body fat. Cardiovascular exercise helps improve heart health, metabolism, hormonal balance, blood sugar control, and recovery from fatigue. Resistance training improves strength, endurance, balance, coordination, reaction time, metabolism, and muscle mass. Flexibility exercise improves circulation, posture, and balance. Seek guidance from your physician and exercise physiologist before  implementing an exercise routine and learn your capabilities and proper form for all exercise.  Introduction to Yoga  Clinical staff conducted group or individual video education with verbal and written material and guidebook.  Patient learns about yoga, a discipline of the coming together of mind, breath, and body. The benefits of yoga include improved flexibility, improved range of motion, better posture and core strength, increased lung function, weight  loss, and positive self-image. Yoga's heart health benefits include lowered blood pressure, healthier heart rate, decreased cholesterol and triglyceride levels, improved immune function, and reduced stress. Seek guidance from your physician and exercise physiologist before implementing an exercise routine and learn your capabilities and proper form for all exercise.  Medical   Aging: Enhancing Your Quality of Life  Clinical staff conducted group or individual video education with verbal and written material and guidebook.  Patient learns key strategies and recommendations to stay in good physical health and enhance quality of life, such as prevention strategies, having an advocate, securing a Health Care Proxy and Power of Attorney, and keeping a list of medications and system for tracking them. It also discusses how to avoid risk for bone loss.  Biology of Weight Control  Clinical staff conducted group or individual video education with verbal and written material and guidebook.  Patient learns that weight gain occurs because we consume more calories than we burn (eating more, moving less). Even if your body weight is normal, you may have higher ratios of fat compared to muscle mass. Too much body fat puts you at increased risk for cardiovascular disease, heart attack, stroke, type 2 diabetes, and obesity-related cancers. In addition to exercise, following the Pritikin Eating Plan can help reduce your risk.  Decoding Lab Results  Clinical staff  conducted group or individual video education with verbal and written material and guidebook.  Patient learns that lab test reflects one measurement whose values change over time and are influenced by many factors, including medication, stress, sleep, exercise, food, hydration, pre-existing medical conditions, and more. It is recommended to use the knowledge from this video to become more involved with your lab results and evaluate your numbers to speak with your doctor.   Diseases of Our Time - Overview  Clinical staff conducted group or individual video education with verbal and written material and guidebook.  Patient learns that according to the CDC, 50% to 70% of chronic diseases (such as obesity, type 2 diabetes, elevated lipids, hypertension, and heart disease) are avoidable through lifestyle improvements including healthier food choices, listening to satiety cues, and increased physical activity.  Sleep Disorders Clinical staff conducted group or individual video education with verbal and written material and guidebook.  Patient learns how good quality and duration of sleep are important to overall health and well-being. Patient also learns about sleep disorders and how they impact health along with recommendations to address them, including discussing with a physician.  Nutrition  Dining Out - Part 2 Clinical staff conducted group or individual video education with verbal and written material and guidebook.  Patient learns how to plan ahead and communicate in order to maximize their dining experience in a healthy and nutritious manner. Included are recommended food choices based on the type of restaurant the patient is visiting.   Fueling a Banker conducted group or individual video education with verbal and written material and guidebook.  There is a strong connection between our food choices and our health. Diseases like obesity and type 2 diabetes are very  prevalent and are in large-part due to lifestyle choices. The Pritikin Eating Plan provides plenty of food and hunger-curbing satisfaction. It is easy to follow, affordable, and helps reduce health risks.  Menu Workshop  Clinical staff conducted group or individual video education with verbal and written material and guidebook.  Patient learns that restaurant meals can sabotage health goals because they are often packed with calories, fat,  sodium, and sugar. Recommendations include strategies to plan ahead and to communicate with the manager, chef, or server to help order a healthier meal.  Planning Your Eating Strategy  Clinical staff conducted group or individual video education with verbal and written material and guidebook.  Patient learns about the Pritikin Eating Plan and its benefit of reducing the risk of disease. The Pritikin Eating Plan does not focus on calories. Instead, it emphasizes high-quality, nutrient-rich foods. By knowing the characteristics of the foods, we choose, we can determine their calorie density and make informed decisions.  Targeting Your Nutrition Priorities  Clinical staff conducted group or individual video education with verbal and written material and guidebook.  Patient learns that lifestyle habits have a tremendous impact on disease risk and progression. This video provides eating and physical activity recommendations based on your personal health goals, such as reducing LDL cholesterol, losing weight, preventing or controlling type 2 diabetes, and reducing high blood pressure.  Vitamins and Minerals  Clinical staff conducted group or individual video education with verbal and written material and guidebook.  Patient learns different ways to obtain key vitamins and minerals, including through a recommended healthy diet. It is important to discuss all supplements you take with your doctor.   Healthy Mind-Set    Smoking Cessation  Clinical staff conducted group  or individual video education with verbal and written material and guidebook.  Patient learns that cigarette smoking and tobacco addiction pose a serious health risk which affects millions of people. Stopping smoking will significantly reduce the risk of heart disease, lung disease, and many forms of cancer. Recommended strategies for quitting are covered, including working with your doctor to develop a successful plan.  Culinary   Becoming a Set designer conducted group or individual video education with verbal and written material and guidebook.  Patient learns that cooking at home can be healthy, cost-effective, quick, and puts them in control. Keys to cooking healthy recipes will include looking at your recipe, assessing your equipment needs, planning ahead, making it simple, choosing cost-effective seasonal ingredients, and limiting the use of added fats, salts, and sugars.  Cooking - Breakfast and Snacks  Clinical staff conducted group or individual video education with verbal and written material and guidebook.  Patient learns how important breakfast is to satiety and nutrition through the entire day. Recommendations include key foods to eat during breakfast to help stabilize blood sugar levels and to prevent overeating at meals later in the day. Planning ahead is also a key component.  Cooking - Educational psychologist conducted group or individual video education with verbal and written material and guidebook.  Patient learns eating strategies to improve overall health, including an approach to cook more at home. Recommendations include thinking of animal protein as a side on your plate rather than center stage and focusing instead on lower calorie dense options like vegetables, fruits, whole grains, and plant-based proteins, such as beans. Making sauces in large quantities to freeze for later and leaving the skin on your vegetables are also recommended to maximize  your experience.  Cooking - Healthy Salads and Dressing Clinical staff conducted group or individual video education with verbal and written material and guidebook.  Patient learns that vegetables, fruits, whole grains, and legumes are the foundations of the Pritikin Eating Plan. Recommendations include how to incorporate each of these in flavorful and healthy salads, and how to create homemade salad dressings. Proper handling of ingredients is also covered. Cooking -  Soups and Desserts  Cooking - Soups and Desserts Clinical staff conducted group or individual video education with verbal and written material and guidebook.  Patient learns that Pritikin soups and desserts make for easy, nutritious, and delicious snacks and meal components that are low in sodium, fat, sugar, and calorie density, while high in vitamins, minerals, and filling fiber. Recommendations include simple and healthy ideas for soups and desserts.   Overview     The Pritikin Solution Program Overview Clinical staff conducted group or individual video education with verbal and written material and guidebook.  Patient learns that the results of the Pritikin Program have been documented in more than 100 articles published in peer-reviewed journals, and the benefits include reducing risk factors for (and, in some cases, even reversing) high cholesterol, high blood pressure, type 2 diabetes, obesity, and more! An overview of the three key pillars of the Pritikin Program will be covered: eating well, doing regular exercise, and having a healthy mind-set.  WORKSHOPS  Exercise: Exercise Basics: Building Your Action Plan Clinical staff led group instruction and group discussion with PowerPoint presentation and patient guidebook. To enhance the learning environment the use of posters, models and videos may be added. At the conclusion of this workshop, patients will comprehend the difference between physical activity and exercise, as well  as the benefits of incorporating both, into their routine. Patients will understand the FITT (Frequency, Intensity, Time, and Type) principle and how to use it to build an exercise action plan. In addition, safety concerns and other considerations for exercise and cardiac rehab will be addressed by the presenter. The purpose of this lesson is to promote a comprehensive and effective weekly exercise routine in order to improve patients' overall level of fitness.   Managing Heart Disease: Your Path to a Healthier Heart Clinical staff led group instruction and group discussion with PowerPoint presentation and patient guidebook. To enhance the learning environment the use of posters, models and videos may be added.At the conclusion of this workshop, patients will understand the anatomy and physiology of the heart. Additionally, they will understand how Pritikin's three pillars impact the risk factors, the progression, and the management of heart disease.  The purpose of this lesson is to provide a high-level overview of the heart, heart disease, and how the Pritikin lifestyle positively impacts risk factors.  Exercise Biomechanics Clinical staff led group instruction and group discussion with PowerPoint presentation and patient guidebook. To enhance the learning environment the use of posters, models and videos may be added. Patients will learn how the structural parts of their bodies function and how these functions impact their daily activities, movement, and exercise. Patients will learn how to promote a neutral spine, learn how to manage pain, and identify ways to improve their physical movement in order to promote healthy living. The purpose of this lesson is to expose patients to common physical limitations that impact physical activity. Participants will learn practical ways to adapt and manage aches and pains, and to minimize their effect on regular exercise. Patients will learn how to  maintain good posture while sitting, walking, and lifting.  Balance Training and Fall Prevention  Clinical staff led group instruction and group discussion with PowerPoint presentation and patient guidebook. To enhance the learning environment the use of posters, models and videos may be added. At the conclusion of this workshop, patients will understand the importance of their sensorimotor skills (vision, proprioception, and the vestibular system) in maintaining their ability to balance as they age. Patients  will apply a variety of balancing exercises that are appropriate for their current level of function. Patients will understand the common causes for poor balance, possible solutions to these problems, and ways to modify their physical environment in order to minimize their fall risk. The purpose of this lesson is to teach patients about the importance of maintaining balance as they age and ways to minimize their risk of falling.  WORKSHOPS   Nutrition:  Fueling a Ship broker led group instruction and group discussion with PowerPoint presentation and patient guidebook. To enhance the learning environment the use of posters, models and videos may be added. Patients will review the foundational principles of the Pritikin Eating Plan and understand what constitutes a serving size in each of the food groups. Patients will also learn Pritikin-friendly foods that are better choices when away from home and review make-ahead meal and snack options. Calorie density will be reviewed and applied to three nutrition priorities: weight maintenance, weight loss, and weight gain. The purpose of this lesson is to reinforce (in a group setting) the key concepts around what patients are recommended to eat and how to apply these guidelines when away from home by planning and selecting Pritikin-friendly options. Patients will understand how calorie density may be adjusted for different weight management  goals.  Mindful Eating  Clinical staff led group instruction and group discussion with PowerPoint presentation and patient guidebook. To enhance the learning environment the use of posters, models and videos may be added. Patients will briefly review the concepts of the Pritikin Eating Plan and the importance of low-calorie dense foods. The concept of mindful eating will be introduced as well as the importance of paying attention to internal hunger signals. Triggers for non-hunger eating and techniques for dealing with triggers will be explored. The purpose of this lesson is to provide patients with the opportunity to review the basic principles of the Pritikin Eating Plan, discuss the value of eating mindfully and how to measure internal cues of hunger and fullness using the Hunger Scale. Patients will also discuss reasons for non-hunger eating and learn strategies to use for controlling emotional eating.  Targeting Your Nutrition Priorities Clinical staff led group instruction and group discussion with PowerPoint presentation and patient guidebook. To enhance the learning environment the use of posters, models and videos may be added. Patients will learn how to determine their genetic susceptibility to disease by reviewing their family history. Patients will gain insight into the importance of diet as part of an overall healthy lifestyle in mitigating the impact of genetics and other environmental insults. The purpose of this lesson is to provide patients with the opportunity to assess their personal nutrition priorities by looking at their family history, their own health history and current risk factors. Patients will also be able to discuss ways of prioritizing and modifying the Pritikin Eating Plan for their highest risk areas  Menu  Clinical staff led group instruction and group discussion with PowerPoint presentation and patient guidebook. To enhance the learning environment the use of posters,  models and videos may be added. Using menus brought in from [employer], or printed from [employer], patients will apply the Pritikin dining out guidelines that were presented in the [employer] video. Patients will also be able to practice these guidelines in a variety of provided scenarios. The purpose of this lesson is to provide patients with the opportunity to practice hands-on learning of the [employer] guidelines with actual menus  and practice scenarios.  Label Reading Clinical staff led group instruction and group discussion with PowerPoint presentation and patient guidebook. To enhance the learning environment the use of posters, models and videos may be added. Patients will review and discuss the Pritikin label reading guidelines presented in Pritikin's Label Reading Educational series video. Using fool labels brought in from local grocery stores and markets, patients will apply the label reading guidelines and determine if the packaged food meet the Pritikin guidelines. The purpose of this lesson is to provide patients with the opportunity to review, discuss, and practice hands-on learning of the Pritikin Label Reading guidelines with actual packaged food labels. Cooking School  Pritikin's [employer] are designed to teach patients ways to prepare quick, simple, and affordable recipes at home. The importance of nutrition's role in chronic disease risk reduction is reflected in its emphasis in the overall Pritikin program. By learning how to prepare essential core Pritikin Eating Plan recipes, patients will increase control over what they eat; be able to customize the flavor of foods without the use of added salt, sugar, or fat; and improve the quality of the food they consume. By learning a set of core recipes which are easily assembled, quickly prepared, and affordable, patients are more likely to prepare more healthy foods at home. These workshops  focus on convenient breakfasts, simple entres, side dishes, and desserts which can be prepared with minimal effort and are consistent with nutrition recommendations for cardiovascular risk reduction. Cooking [employer] are taught by a Armed forces logistics/support/administrative officer (RD) who has been trained by the [employer]. The chef or RD has a clear understanding of the importance of minimizing - if not completely eliminating - added fat, sugar, and sodium in recipes. Throughout the series of Cooking School Workshop sessions, patients will learn about healthy ingredients and efficient methods of cooking to build confidence in their capability to prepare    Cooking School weekly topics:  Adding Flavor- Sodium-Free  Fast and Healthy Breakfasts  Powerhouse Plant-Based Proteins  Satisfying Salads and Dressings  Simple Sides and Sauces  International Cuisine-Spotlight on the [employer] Zones  Delicious Desserts  Savory Soups  [employer] - Meals in a Astronomer Appetizers and Snacks  Comforting Weekend Breakfasts  One-Pot Wonders   Fast Evening Meals  Landscape architect Your Pritikin Plate  WORKSHOPS   Healthy Mindset (Psychosocial):  Focused Goals, Sustainable Changes Clinical staff led group instruction and group discussion with PowerPoint presentation and patient guidebook. To enhance the learning environment the use of posters, models and videos may be added. Patients will be able to apply effective goal setting strategies to establish at least one personal goal, and then take consistent, meaningful action toward that goal. They will learn to identify common barriers to achieving personal goals and develop strategies to overcome them. Patients will also gain an understanding of how our mind-set can impact our ability to achieve goals and the importance of cultivating a positive and growth-oriented mind-set. The purpose of this lesson is to provide patients with a deeper  understanding of how to set and achieve personal goals, as well as the tools and strategies needed to overcome common obstacles which may arise along the way.  From Head to Heart: The Power of a Healthy Outlook  Clinical staff led group instruction and group discussion with PowerPoint presentation and patient guidebook. To enhance the learning environment the use of posters, models and videos may be added. Patients will be  able to recognize and describe the impact of emotions and mood on physical health. They will discover the importance of self-care and explore self-care practices which may work for them. Patients will also learn how to utilize the 4 C's to cultivate a healthier outlook and better manage stress and challenges. The purpose of this lesson is to demonstrate to patients how a healthy outlook is an essential part of maintaining good health, especially as they continue their cardiac rehab journey.  Healthy Sleep for a Healthy Heart Clinical staff led group instruction and group discussion with PowerPoint presentation and patient guidebook. To enhance the learning environment the use of posters, models and videos may be added. At the conclusion of this workshop, patients will be able to demonstrate knowledge of the importance of sleep to overall health, well-being, and quality of life. They will understand the symptoms of, and treatments for, common sleep disorders. Patients will also be able to identify daytime and nighttime behaviors which impact sleep, and they will be able to apply these tools to help manage sleep-related challenges. The purpose of this lesson is to provide patients with a general overview of sleep and outline the importance of quality sleep. Patients will learn about a few of the most common sleep disorders. Patients will also be introduced to the concept of "sleep hygiene," and discover ways to self-manage certain sleeping problems through simple daily behavior changes.  Finally, the workshop will motivate patients by clarifying the links between quality sleep and their goals of heart-healthy living.   Recognizing and Reducing Stress Clinical staff led group instruction and group discussion with PowerPoint presentation and patient guidebook. To enhance the learning environment the use of posters, models and videos may be added. At the conclusion of this workshop, patients will be able to understand the types of stress reactions, differentiate between acute and chronic stress, and recognize the impact that chronic stress has on their health. They will also be able to apply different coping mechanisms, such as reframing negative self-talk. Patients will have the opportunity to practice a variety of stress management techniques, such as deep abdominal breathing, progressive muscle relaxation, and/or guided imagery.  The purpose of this lesson is to educate patients on the role of stress in their lives and to provide healthy techniques for coping with it.  Learning Barriers/Preferences:  Learning Barriers/Preferences - 07/21/23 1051       Learning Barriers/Preferences   Learning Barriers None    Learning Preferences Audio;Computer/Internet;Group Instruction;Individual Instruction;Skilled Demonstration;Verbal Instruction;Video;Written Material;Pictoral             Education Topics:  Knowledge Questionnaire Score:  Knowledge Questionnaire Score - 07/21/23 1051       Knowledge Questionnaire Score   Pre Score 22/24             Core Components/Risk Factors/Patient Goals at Admission:  Personal Goals and Risk Factors at Admission - 07/21/23 1051       Core Components/Risk Factors/Patient Goals on Admission    Weight Management Yes;Weight Loss    Intervention Weight Management: Develop a combined nutrition and exercise program designed to reach desired caloric intake, while maintaining appropriate intake of nutrient and fiber, sodium and fats, and  appropriate energy expenditure required for the weight goal.;Weight Management: Provide education and appropriate resources to help participant work on and attain dietary goals.;Weight Management/Obesity: Establish reasonable short term and long term weight goals.;Obesity: Provide education and appropriate resources to help participant work on and attain dietary goals.    Expected Outcomes  Short Term: Continue to assess and modify interventions until short term weight is achieved;Long Term: Adherence to nutrition and physical activity/exercise program aimed toward attainment of established weight goal;Weight Loss: Understanding of general recommendations for a balanced deficit meal plan, which promotes 1-2 lb weight loss per week and includes a negative energy balance of [phone] kcal/d;Understanding recommendations for meals to include 15-35% energy as protein, 25-35% energy from fat, 35-60% energy from carbohydrates, less than 200mg  of dietary cholesterol, 20-35 gm of total fiber daily;Understanding of distribution of calorie intake throughout the day with the consumption of 4-5 meals/snacks    Hypertension Yes    Intervention Provide education on lifestyle modifcations including regular physical activity/exercise, weight management, moderate sodium restriction and increased consumption of fresh fruit, vegetables, and low fat dairy, alcohol moderation, and smoking cessation.;Monitor prescription use compliance.    Expected Outcomes Short Term: Continued assessment and intervention until BP is < 140/36mm HG in hypertensive participants. < 130/34mm HG in hypertensive participants with diabetes, heart failure or chronic kidney disease.;Long Term: Maintenance of blood pressure at goal levels.    Lipids Yes    Intervention Provide education and support for participant on nutrition & aerobic/resistive exercise along with prescribed medications to achieve LDL 70mg , HDL >40mg .    Expected Outcomes Short Term:  Participant states understanding of desired cholesterol values and is compliant with medications prescribed. Participant is following exercise prescription and nutrition guidelines.;Long Term: Cholesterol controlled with medications as prescribed, with individualized exercise RX and with personalized nutrition plan. Value goals: LDL < 70mg , HDL > 40 mg.    Stress Yes    Intervention Offer individual and/or small group education and counseling on adjustment to heart disease, stress management and health-related lifestyle change. Teach and support self-help strategies.;Refer participants experiencing significant psychosocial distress to appropriate mental health specialists for further evaluation and treatment. When possible, include family members and significant others in education/counseling sessions.    Expected Outcomes Short Term: Participant demonstrates changes in health-related behavior, relaxation and other stress management skills, ability to obtain effective social support, and compliance with psychotropic medications if prescribed.;Long Term: Emotional wellbeing is indicated by absence of clinically significant psychosocial distress or social isolation.             Core Components/Risk Factors/Patient Goals Review:    Core Components/Risk Factors/Patient Goals at Discharge (Final Review):    ITP Comments:  ITP Comments     Row Name 07/21/23 0955           ITP Comments Dr. Armanda Magic medical director. Introduction to pritikin education/intensive cardiac rehab. Initial orientation packet reviewed with patient.                Comments: Participant attended orientation for the cardiac rehabilitation program on  07/21/2023  to perform initial intake and exercise walk test. Patient introduced to the [facility] Program education and orientation packet was reviewed. Completed 6-minute walk test, measurements, initial ITP, and exercise prescription. Vital signs stable.  Telemetry-normal sinus rhythm, asymptomatic. Pt has been taking sublingual nitroglycerin at home, RN Byrd Hesselbach aware who notified on site provider. See note for details.   Service time was from 1001 to 1200.  Jonna Coup, MS, ACSM-CEP 07/21/2023 1:35 PM

## 2023-07-23 ENCOUNTER — Other Ambulatory Visit (HOSPITAL_COMMUNITY): Payer: Self-pay

## 2023-07-25 ENCOUNTER — Encounter (HOSPITAL_COMMUNITY)
Admission: RE | Admit: 2023-07-25 | Discharge: 2023-07-25 | Disposition: A | Discharge status: Home / Self Care | Payer: 59 | Source: Ambulatory Visit | Attending: Cardiology | Admitting: Cardiology

## 2023-07-25 DIAGNOSIS — Z955 Presence of coronary angioplasty implant and graft: Secondary | ICD-10-CM | POA: Diagnosis present

## 2023-07-25 DIAGNOSIS — R079 Chest pain, unspecified: Secondary | ICD-10-CM

## 2023-07-25 NOTE — Progress Notes (Signed)
Mariha reported having 6/10 chest pressure on the nustep 12 minutes into exercise. Denies radiation, nausea. Skin warm and dry. Blood pressure 124/70. Oxygen saturation 98% on room air. Telemetry rhythm Sinus tach 115. Onsite provider Bernadene Person NP notified. 12 lead ECG ordered and obtained. 12 lead ECG shows normal sinus rhythm. Bernadene Person NP advised outpatient follow up before resuming exercise at cardiac rehab. Appointment obtained for Shammara to see Wallis Bamberg NP on 08/02/23. Will hold exercise until Araya is seen in the office and cleared to return to exercise. Patient states understanding and know to call Dr Lowella Curb office or to go to the ED if she has any more chest pressure. Shabria's chest pressure went away when exercise was stopped.Thayer Headings RN BSN

## 2023-07-25 NOTE — Progress Notes (Signed)
Daily Session Note  Patient Details  Name: Casey [name] MRN: [id number] Date of Birth: May 02, 1971 Referring Provider:   Flowsheet Row INTENSIVE CARDIAC REHAB ORIENT from 07/21/2023 in [facility]  Referring Provider Gypsy Balsam, MD       Encounter Date: 07/25/2023  Check In:  Session Check In - 07/25/23 1503       Check-In   Supervising physician immediately available to respond to emergencies [facility] - Physician supervision    Physician(s) Bernadene Person, NP    Location [facility]    Staff Present Lorin Picket, MS, ACSM-CEP, CCRP, Exercise Physiologist;Johnny Hale Bogus, MS, Exercise Physiologist;Olinty Peggye Pitt, MS, ACSM-CEP, Exercise Physiologist;Jetta Dan Humphreys BS, ACSM-CEP, Exercise Physiologist;Evaristo Tsuda, RN, BSN    Virtual Visit No    Medication changes reported     No    Fall or balance concerns reported    No    Tobacco Cessation No Change    Warm-up and Cool-down Performed as group-led instruction    Resistance Training Performed Yes    VAD Patient? No    PAD/SET Patient? No      Pain Assessment   Currently in Pain? No/denies    Pain Score 0-No pain    Multiple Pain Sites No             Capillary Blood Glucose: No results found for this or any previous visit (from the past 24 hours).   Exercise Prescription Changes - 07/25/23 1636       Response to Exercise   Blood Pressure (Admit) 126/80    Blood Pressure (Exercise) 124/70    Blood Pressure (Exit) 118/74    Heart Rate (Admit) 94 bpm    Heart Rate (Exercise) 111 bpm    Heart Rate (Exit) 103 bpm    Rating of Perceived Exertion (Exercise) 11.5    Perceived Dyspnea (Exercise) 0    Symptoms Chest pressure 15 mins into nustep. Stopped exercise 12 lead ECG obtained    Comments Pt first day in the [employer] program    Duration Progress to 30 minutes of  aerobic without signs/symptoms of physical distress    Intensity THRR  unchanged      Progression   Progression Continue to progress workloads to maintain intensity without signs/symptoms of physical distress.      Resistance Training   Training Prescription No    Weight 2   No cool down due to CP   Reps 10-15      NuStep   Level 1   Stopped exercise early due to CP   Minutes 15    METs 1.5             Social History   Tobacco Use  Smoking Status Never   Passive exposure: Never  Smokeless Tobacco Never    Goals Met:  Patient did not complete session today  Goals Unmet:  Had chest pressure at cardiac rehab  Comments:Pt started cardiac rehab today.  Pt tolerated light exercise without difficulty. VSS, telemetry-Sinus Rhythm, See previous documentation as Deb had chest pressure during exercise today.  Medication list reconciled. Exercise is currently on hold.Thayer Headings RN BSN    Dr. Armanda Magic is Medical Director for Cardiac Rehab at [facility].

## 2023-07-25 NOTE — Telephone Encounter (Signed)
Eye Exam Result abstracted.

## 2023-07-27 ENCOUNTER — Encounter (HOSPITAL_COMMUNITY): Payer: 59

## 2023-07-29 ENCOUNTER — Telehealth: Payer: Self-pay | Admitting: Cardiology

## 2023-07-29 ENCOUNTER — Encounter (HOSPITAL_COMMUNITY): Payer: Self-pay

## 2023-07-29 ENCOUNTER — Other Ambulatory Visit: Payer: Self-pay

## 2023-07-29 ENCOUNTER — Emergency Department (HOSPITAL_COMMUNITY): Payer: 59

## 2023-07-29 ENCOUNTER — Inpatient Hospital Stay (HOSPITAL_COMMUNITY)
Admission: EM | Admit: 2023-07-29 | Discharge: 2023-08-02 | DRG: 287 | Disposition: A | Discharge status: Home / Self Care | Payer: 59 | Attending: Internal Medicine | Admitting: Internal Medicine

## 2023-07-29 ENCOUNTER — Encounter (HOSPITAL_COMMUNITY): Payer: 59

## 2023-07-29 DIAGNOSIS — Z9102 Food additives allergy status: Secondary | ICD-10-CM

## 2023-07-29 DIAGNOSIS — Z86718 Personal history of other venous thrombosis and embolism: Secondary | ICD-10-CM

## 2023-07-29 DIAGNOSIS — R079 Chest pain, unspecified: Principal | ICD-10-CM

## 2023-07-29 DIAGNOSIS — Z880 Allergy status to penicillin: Secondary | ICD-10-CM

## 2023-07-29 DIAGNOSIS — Z7902 Long term (current) use of antithrombotics/antiplatelets: Secondary | ICD-10-CM

## 2023-07-29 DIAGNOSIS — I3139 Other pericardial effusion (noninflammatory): Secondary | ICD-10-CM | POA: Diagnosis not present

## 2023-07-29 DIAGNOSIS — J4540 Moderate persistent asthma, uncomplicated: Secondary | ICD-10-CM | POA: Diagnosis present

## 2023-07-29 DIAGNOSIS — K219 Gastro-esophageal reflux disease without esophagitis: Secondary | ICD-10-CM | POA: Diagnosis present

## 2023-07-29 DIAGNOSIS — I509 Heart failure, unspecified: Secondary | ICD-10-CM | POA: Diagnosis present

## 2023-07-29 DIAGNOSIS — M069 Rheumatoid arthritis, unspecified: Secondary | ICD-10-CM | POA: Diagnosis present

## 2023-07-29 DIAGNOSIS — M797 Fibromyalgia: Secondary | ICD-10-CM | POA: Diagnosis present

## 2023-07-29 DIAGNOSIS — K5901 Slow transit constipation: Secondary | ICD-10-CM | POA: Diagnosis present

## 2023-07-29 DIAGNOSIS — T361X6A Underdosing of cephalosporins and other beta-lactam antibiotics, initial encounter: Secondary | ICD-10-CM | POA: Diagnosis present

## 2023-07-29 DIAGNOSIS — Z5986 Financial insecurity: Secondary | ICD-10-CM

## 2023-07-29 DIAGNOSIS — Z6839 Body mass index (BMI) 39.0-39.9, adult: Secondary | ICD-10-CM

## 2023-07-29 DIAGNOSIS — I110 Hypertensive heart disease with heart failure: Secondary | ICD-10-CM | POA: Diagnosis present

## 2023-07-29 DIAGNOSIS — I25110 Atherosclerotic heart disease of native coronary artery with unstable angina pectoris: Principal | ICD-10-CM | POA: Diagnosis present

## 2023-07-29 DIAGNOSIS — B9689 Other specified bacterial agents as the cause of diseases classified elsewhere: Secondary | ICD-10-CM | POA: Diagnosis present

## 2023-07-29 DIAGNOSIS — E782 Mixed hyperlipidemia: Secondary | ICD-10-CM | POA: Diagnosis not present

## 2023-07-29 DIAGNOSIS — Z955 Presence of coronary angioplasty implant and graft: Secondary | ICD-10-CM

## 2023-07-29 DIAGNOSIS — T463X5A Adverse effect of coronary vasodilators, initial encounter: Secondary | ICD-10-CM | POA: Diagnosis not present

## 2023-07-29 DIAGNOSIS — E876 Hypokalemia: Secondary | ICD-10-CM | POA: Diagnosis present

## 2023-07-29 DIAGNOSIS — I10 Essential (primary) hypertension: Secondary | ICD-10-CM | POA: Diagnosis present

## 2023-07-29 DIAGNOSIS — Z8249 Family history of ischemic heart disease and other diseases of the circulatory system: Secondary | ICD-10-CM

## 2023-07-29 DIAGNOSIS — F419 Anxiety disorder, unspecified: Secondary | ICD-10-CM | POA: Diagnosis present

## 2023-07-29 DIAGNOSIS — B379 Candidiasis, unspecified: Secondary | ICD-10-CM | POA: Diagnosis present

## 2023-07-29 DIAGNOSIS — G4733 Obstructive sleep apnea (adult) (pediatric): Secondary | ICD-10-CM | POA: Diagnosis present

## 2023-07-29 DIAGNOSIS — I200 Unstable angina: Secondary | ICD-10-CM

## 2023-07-29 DIAGNOSIS — Z9981 Dependence on supplemental oxygen: Secondary | ICD-10-CM

## 2023-07-29 DIAGNOSIS — E7800 Pure hypercholesterolemia, unspecified: Secondary | ICD-10-CM | POA: Diagnosis present

## 2023-07-29 DIAGNOSIS — Z881 Allergy status to other antibiotic agents status: Secondary | ICD-10-CM

## 2023-07-29 DIAGNOSIS — Z860101 Personal history of adenomatous and serrated colon polyps: Secondary | ICD-10-CM

## 2023-07-29 DIAGNOSIS — Z882 Allergy status to sulfonamides status: Secondary | ICD-10-CM

## 2023-07-29 DIAGNOSIS — K5090 Crohn's disease, unspecified, without complications: Secondary | ICD-10-CM | POA: Diagnosis present

## 2023-07-29 DIAGNOSIS — E66812 Obesity, class 2: Secondary | ICD-10-CM | POA: Diagnosis present

## 2023-07-29 DIAGNOSIS — N390 Urinary tract infection, site not specified: Secondary | ICD-10-CM | POA: Diagnosis present

## 2023-07-29 DIAGNOSIS — J8283 Eosinophilic asthma: Secondary | ICD-10-CM | POA: Diagnosis present

## 2023-07-29 DIAGNOSIS — I2510 Atherosclerotic heart disease of native coronary artery without angina pectoris: Secondary | ICD-10-CM | POA: Diagnosis present

## 2023-07-29 DIAGNOSIS — G4440 Drug-induced headache, not elsewhere classified, not intractable: Secondary | ICD-10-CM | POA: Diagnosis not present

## 2023-07-29 DIAGNOSIS — I25118 Atherosclerotic heart disease of native coronary artery with other forms of angina pectoris: Secondary | ICD-10-CM | POA: Diagnosis not present

## 2023-07-29 DIAGNOSIS — M0609 Rheumatoid arthritis without rheumatoid factor, multiple sites: Secondary | ICD-10-CM | POA: Diagnosis present

## 2023-07-29 DIAGNOSIS — Z9884 Bariatric surgery status: Secondary | ICD-10-CM

## 2023-07-29 DIAGNOSIS — E785 Hyperlipidemia, unspecified: Secondary | ICD-10-CM | POA: Diagnosis present

## 2023-07-29 DIAGNOSIS — Z713 Dietary counseling and surveillance: Secondary | ICD-10-CM

## 2023-07-29 DIAGNOSIS — E119 Type 2 diabetes mellitus without complications: Secondary | ICD-10-CM | POA: Diagnosis present

## 2023-07-29 DIAGNOSIS — Z91048 Other nonmedicinal substance allergy status: Secondary | ICD-10-CM

## 2023-07-29 DIAGNOSIS — Z947 Corneal transplant status: Secondary | ICD-10-CM

## 2023-07-29 DIAGNOSIS — Z79899 Other long term (current) drug therapy: Secondary | ICD-10-CM

## 2023-07-29 DIAGNOSIS — Z833 Family history of diabetes mellitus: Secondary | ICD-10-CM

## 2023-07-29 DIAGNOSIS — Z96651 Presence of right artificial knee joint: Secondary | ICD-10-CM | POA: Diagnosis present

## 2023-07-29 DIAGNOSIS — Z888 Allergy status to other drugs, medicaments and biological substances status: Secondary | ICD-10-CM

## 2023-07-29 DIAGNOSIS — Z886 Allergy status to analgesic agent status: Secondary | ICD-10-CM

## 2023-07-29 DIAGNOSIS — M170 Bilateral primary osteoarthritis of knee: Secondary | ICD-10-CM | POA: Diagnosis present

## 2023-07-29 DIAGNOSIS — Z885 Allergy status to narcotic agent status: Secondary | ICD-10-CM

## 2023-07-29 DIAGNOSIS — R700 Elevated erythrocyte sedimentation rate: Secondary | ICD-10-CM | POA: Diagnosis not present

## 2023-07-29 HISTORY — DX: Unstable angina: I20.0

## 2023-07-29 LAB — BASIC METABOLIC PANEL
Anion gap: 11 (ref 5–15)
BUN: 9 mg/dL (ref 6–20)
CO2: 25 mmol/L (ref 22–32)
Calcium: 9.4 mg/dL (ref 8.9–10.3)
Chloride: 103 mmol/L (ref 98–111)
Creatinine, Ser: 0.78 mg/dL (ref 0.44–1.00)
GFR, Estimated: >60 mL/min (ref >60)
Glucose, Bld: 111 mg/dL — ABNORMAL HIGH (ref 70–99)
Potassium: 3.5 mmol/L (ref 3.5–5.1)
Sodium: 139 mmol/L (ref 135–145)

## 2023-07-29 LAB — CBC
HCT: 41.6 % (ref 36.0–46.0)
Hemoglobin: 13.6 g/dL (ref 12.0–15.0)
MCH: 26.7 pg (ref 26.0–34.0)
MCHC: 32.7 g/dL (ref 30.0–36.0)
MCV: 81.7 fL (ref 80.0–100.0)
Platelets: 337 10*3/uL (ref 150–400)
RBC: 5.09 MIL/uL (ref 3.87–5.11)
RDW: 17.1 % — ABNORMAL HIGH (ref 11.5–15.5)
WBC: 10.1 10*3/uL (ref 4.0–10.5)
nRBC: 0 % (ref 0.0–0.2)

## 2023-07-29 LAB — TROPONIN I (HIGH SENSITIVITY)
Troponin I (High Sensitivity): 3 ng/L (ref <18)
Troponin I (High Sensitivity): 3 ng/L (ref <18)

## 2023-07-29 MED ORDER — ACETAMINOPHEN 325 MG PO TABS: 650.0000 mg | ORAL_TABLET | Freq: Four times a day (QID) | ORAL | Status: DC | PRN

## 2023-07-29 MED ORDER — MONTELUKAST SODIUM 10 MG PO TABS
10.0000 mg | ORAL_TABLET | Freq: Every day | ORAL | Status: DC
  Administered 2023-07-30 – 2023-08-02 (×4): 10 mg via ORAL
  Filled 2023-07-29 (×4): qty 1

## 2023-07-29 MED ORDER — HEPARIN BOLUS VIA INFUSION
4000.0000 [IU] | Freq: Once | INTRAVENOUS | Status: AC
  Administered 2023-07-29: 4000 [IU] via INTRAVENOUS
  Filled 2023-07-29: qty 4000

## 2023-07-29 MED ORDER — HEPARIN (PORCINE) 25000 UT/250ML-% IV SOLN
1250.0000 [IU]/h | INTRAVENOUS | Status: DC
  Administered 2023-07-29: 900 [IU]/h via INTRAVENOUS
  Administered 2023-07-30: 1100 [IU]/h via INTRAVENOUS
  Administered 2023-07-31 – 2023-08-01 (×2): 1250 [IU]/h via INTRAVENOUS
  Filled 2023-07-29 (×4): qty 250

## 2023-07-29 MED ORDER — NITROGLYCERIN IN D5W 200-5 MCG/ML-% IV SOLN
0.0000 ug/min | INTRAVENOUS | Status: DC
  Administered 2023-07-29: 5 ug/min via INTRAVENOUS
  Administered 2023-08-01: 10 ug/min via INTRAVENOUS
  Filled 2023-07-29 (×2): qty 250

## 2023-07-29 MED ORDER — ONDANSETRON HCL 4 MG/2ML IJ SOLN
4.0000 mg | Freq: Once | INTRAMUSCULAR | Status: DC
  Filled 2023-07-29: qty 2

## 2023-07-29 MED ORDER — MORPHINE SULFATE (PF) 4 MG/ML IV SOLN
4.0000 mg | Freq: Once | INTRAVENOUS | Status: DC
  Filled 2023-07-29: qty 1

## 2023-07-29 MED ORDER — ALBUTEROL SULFATE (2.5 MG/3ML) 0.083% IN NEBU: 2.5000 mg | INHALATION_SOLUTION | Freq: Four times a day (QID) | RESPIRATORY_TRACT | Status: DC | PRN

## 2023-07-29 MED ORDER — PROMETHAZINE HCL 12.5 MG PO TABS: 12.5000 mg | ORAL_TABLET | Freq: Four times a day (QID) | ORAL | Status: DC | PRN

## 2023-07-29 MED ORDER — POLYETHYLENE GLYCOL 3350 17 G PO PACK: 17.0000 g | PACK | Freq: Every day | ORAL | Status: DC | PRN

## 2023-07-29 MED ORDER — PANTOPRAZOLE SODIUM 40 MG PO TBEC
80.0000 mg | DELAYED_RELEASE_TABLET | Freq: Every day | ORAL | Status: DC
  Filled 2023-07-29 (×2): qty 2

## 2023-07-29 MED ORDER — SODIUM CHLORIDE 0.9% FLUSH
3.0000 mL | Freq: Two times a day (BID) | INTRAVENOUS | Status: DC
  Administered 2023-07-31 – 2023-08-01 (×2): 3 mL via INTRAVENOUS

## 2023-07-29 MED ORDER — MORPHINE SULFATE (PF) 2 MG/ML IV SOLN: 1.0000 mg | INTRAVENOUS | Status: DC | PRN

## 2023-07-29 MED ORDER — ACETAMINOPHEN 650 MG RE SUPP: 650.0000 mg | Freq: Four times a day (QID) | RECTAL | Status: DC | PRN

## 2023-07-29 MED ORDER — ATORVASTATIN CALCIUM 80 MG PO TABS
80.0000 mg | ORAL_TABLET | Freq: Every day | ORAL | Status: DC
  Administered 2023-07-30 – 2023-08-02 (×4): 80 mg via ORAL
  Filled 2023-07-29 (×4): qty 1

## 2023-07-29 MED ORDER — TICAGRELOR 90 MG PO TABS
90.0000 mg | ORAL_TABLET | Freq: Two times a day (BID) | ORAL | Status: DC
Start: 2023-07-29 — End: 2023-08-02
  Administered 2023-07-29 – 2023-08-02 (×8): 90 mg via ORAL
  Filled 2023-07-29 (×8): qty 1

## 2023-07-29 MED ORDER — LINACLOTIDE 72 MCG PO CAPS
144.0000 ug | ORAL_CAPSULE | Freq: Every day | ORAL | Status: DC
  Administered 2023-07-31 – 2023-08-02 (×3): 144 ug via ORAL
  Filled 2023-07-29 (×3): qty 2

## 2023-07-29 NOTE — ED Provider Notes (Signed)
[place] EMERGENCY DEPARTMENT AT [facility] Provider Note   CSN: [id number] Arrival date & time: 07/29/23  1301     History  Chief Complaint  Patient presents with   Chest Pain    Casey [name] is a 52 y.o. female.  52 yo F with a chief complaint of chest pain.  This has been off and on for at least the past week.  Symptoms got significantly worse last night and her pain has persisted and has not resolved despite nitroglycerin.  She was just in the hospital couple weeks ago and found to have an 85% LAD occlusion that required stenting.  She went to cardiac rehab on Monday and during simple exertion had developed pain similar to her presenting symptoms prior to stenting.  She was told to not come back to cardiac rehab until she was reevaluated and cleared.   Chest Pain      Home Medications Prior to Admission medications   Medication Sig Start Date End Date Taking? Authorizing Provider  acetaminophen (TYLENOL) 500 MG tablet Take 1,000 mg by mouth every 12 (twelve) hours as needed for mild pain or moderate pain.   Yes [provider]  albuterol (PROVENTIL) (2.5 MG/3ML) 0.083% nebulizer solution Take 3 mLs (2.5 mg total) by nebulization every 6 (six) hours as needed for wheezing or shortness of breath. 11/30/22  Yes Olive Bass, FNP  amLODipine (NORVASC) 5 MG tablet Take 1 tablet (5 mg total) by mouth daily. 07/03/23  Yes Lonia Blood, MD  atorvastatin (LIPITOR) 80 MG tablet Take 1 tablet (80 mg total) by mouth daily. 07/03/23  Yes Lonia Blood, MD  B Complex-C (B-COMPLEX WITH VITAMIN C) tablet Take 1 tablet by mouth daily. 09/06/19  Yes [provider]  Cholecalciferol (VITAMIN D3) 50 MCG (2000 UT) TABS Take 1 tablet by mouth daily.   Yes [provider]  EPINEPHrine (EPIPEN 2-PAK) 0.3 mg/0.3 mL IJ SOAJ injection Inject 0.3 mg into the muscle as needed for anaphylaxis. 01/18/23  Yes Padgett, Pilar Grammes, MD   esomeprazole (NEXIUM) 40 MG capsule Take 1 capsule (40 mg total) by mouth 2 (two) times daily before a meal. Patient taking differently: Take 80 mg by mouth daily. 11/30/22  Yes Olive Bass, FNP  famotidine (PEPCID) 40 MG tablet Take 1 tablet (40 mg total) by mouth daily. 11/30/22  Yes Olive Bass, FNP  Golimumab [facility] ARIA IV) Inject 200 mg into the vein every 8 (eight) weeks.   Yes [provider]  levocetirizine (XYZAL) 5 MG tablet Take 10 mg by mouth in the morning.   Yes [provider]  linaclotide Karlene Einstein) 72 MCG capsule Take 144 mcg by mouth daily before breakfast. 03/23/23  Yes [provider]  mirabegron ER (MYRBETRIQ) 50 MG TB24 tablet Take 1 tablet (50 mg total) by mouth daily. 11/30/22  Yes Olive Bass, FNP  montelukast (SINGULAIR) 10 MG tablet Take 1 tablet (10 mg total) by mouth at bedtime. Patient taking differently: Take 10 mg by mouth daily. 11/30/22  Yes Olive Bass, FNP  nitroGLYCERIN (NITROSTAT) 0.4 MG SL tablet Place 1 tablet (0.4 mg total) under the tongue every 5 (five) minutes as needed for chest pain. 10/13/22  Yes Georgeanna Lea, MD  OXYGEN Inhale 2 L into the lungs at bedtime.   Yes [provider]  ticagrelor (BRILINTA) 90 MG TABS tablet Take 1 tablet (90 mg total) by mouth 2 (two) times daily. 07/01/23  Yes  Lonia Blood, MD  ZOLMitriptan (ZOMIG) 2.5 MG tablet Take 1 tablet (2.5 mg total) by mouth once for 1 dose. May repeat in 2 hours if headache persists or recurs. Patient taking differently: Take 2.5 mg by mouth daily as needed for migraine. 11/30/22 09/16/23 Yes Olive Bass, FNP      Allergies    Albuterol sulfate, Tape, Formoterol, Hydrocodone, Hydrocodone-acetaminophen, Nickel, Other, Oxycodone-acetaminophen, Salmeterol, Strawberry extract, Advair hfa [fluticasone-salmeterol], Proventil hfa [albuterol], Asa [aspirin], Cleocin [clindamycin], Clindamycin/lincomycin,  Fludrocortisone acetate, Gabapentin, Hydrochlorothiazide, Imipramine, Ipratropium bromide, Medroxyprogesterone, Meloxicam, Metformin and related, Nystatin, Penicillins, Pred forte [prednisolone], Prednisone, Solu-medrol [methylprednisolone], Sulfa antibiotics, Tegaderm ag mesh [silver], Toradol [ketorolac tromethamine], and Tramadol    Review of Systems   Review of Systems  Cardiovascular:  Positive for chest pain.    Physical Exam Updated Vital Signs BP 120/64   Pulse 97   Temp 98.1 F (36.7 C) (Oral)   Resp 20   Ht 5\' 3"  (1.6 m)   Wt 102.1 kg   SpO2 100%   BMI 39.86 kg/m  Physical Exam Vitals and nursing note reviewed.  Constitutional:      General: She is not in acute distress.    Appearance: She is well-developed. She is not diaphoretic.  HENT:     Head: Normocephalic and atraumatic.  Eyes:     Pupils: Pupils are equal, round, and reactive to light.  Cardiovascular:     Rate and Rhythm: Normal rate and regular rhythm.     Heart sounds: No murmur heard.    No friction rub. No gallop.  Pulmonary:     Effort: Pulmonary effort is normal.     Breath sounds: No wheezing or rales.  Abdominal:     General: There is no distension.     Palpations: Abdomen is soft.     Tenderness: There is no abdominal tenderness.  Musculoskeletal:        General: No tenderness.     Cervical back: Normal range of motion and neck supple.  Skin:    General: Skin is warm and dry.  Neurological:     Mental Status: She is alert and oriented to person, place, and time.  Psychiatric:        Behavior: Behavior normal.     ED Results / Procedures / Treatments   Labs (all labs ordered are listed, but only abnormal results are displayed) Labs Reviewed  BASIC METABOLIC PANEL - Abnormal; Notable for the following components:      Result Value   Glucose, Bld 111 (*)    All other components within normal limits  CBC - Abnormal; Notable for the following components:   RDW 17.1 (*)    All other  components within normal limits  HEPARIN LEVEL (UNFRACTIONATED)  CBC  BASIC METABOLIC PANEL  TROPONIN I (HIGH SENSITIVITY)  TROPONIN I (HIGH SENSITIVITY)    EKG None  Radiology DG Chest 2 View Result Date: 07/29/2023 CLINICAL DATA:  Chest pain. EXAM: CHEST - 2 VIEW COMPARISON:  Chest radiograph dated June 29, 2023. FINDINGS: Low lung volume. The heart size and mediastinal contours are within normal limits. No focal consolidation, pleural effusion, or pneumothorax. No acute osseous abnormality. IMPRESSION: Low lung volumes.  No acute cardiopulmonary findings. Electronically Signed   By: Hart Robinsons M.D.   On: 07/29/2023 16:19    Procedures .Critical Care  Performed by: Melene Plan, DO Authorized by: Melene Plan, DO   Critical care provider statement:    Critical care time (minutes):  35   Critical care time was exclusive of:  Separately billable procedures and treating other patients   Critical care was time spent personally by me on the following activities:  Development of treatment plan with patient or surrogate, discussions with consultants, evaluation of patient's response to treatment, examination of patient, ordering and review of laboratory studies, ordering and review of radiographic studies, ordering and performing treatments and interventions, pulse oximetry, re-evaluation of patient's condition and review of old charts   Care discussed with: admitting provider       Medications Ordered in ED Medications  ondansetron (ZOFRAN) injection 4 mg (4 mg Intravenous Not Given 07/29/23 1854)  ticagrelor (BRILINTA) tablet 90 mg (90 mg Oral Given 07/29/23 1925)  nitroGLYCERIN 50 mg in dextrose 5 % 250 mL (0.2 mg/mL) infusion (15 mcg/min Intravenous Infusion Verify 07/29/23 2104)  heparin ADULT infusion 100 units/mL (25000 units/278mL) (900 Units/hr Intravenous Infusion Verify 07/29/23 2027)  sodium chloride flush (NS) 0.9 % injection 3 mL (3 mLs Intravenous Not Given  07/29/23 2126)  acetaminophen (TYLENOL) tablet 650 mg (has no administration in time range)    Or  acetaminophen (TYLENOL) suppository 650 mg (has no administration in time range)  promethazine (PHENERGAN) tablet 12.5 mg (has no administration in time range)  polyethylene glycol (MIRALAX / GLYCOLAX) packet 17 g (has no administration in time range)  albuterol (PROVENTIL) (2.5 MG/3ML) 0.083% nebulizer solution 2.5 mg (has no administration in time range)  atorvastatin (LIPITOR) tablet 80 mg (has no administration in time range)  pantoprazole (PROTONIX) EC tablet 80 mg (has no administration in time range)  linaclotide (LINZESS) capsule 144 mcg (has no administration in time range)  montelukast (SINGULAIR) tablet 10 mg (has no administration in time range)  morphine (PF) 2 MG/ML injection 1 mg (has no administration in time range)  heparin bolus via infusion 4,000 Units (4,000 Units Intravenous Bolus from Bag 07/29/23 1930)    ED Course/ Medical Decision Making/ A&P                                 Medical Decision Making Amount and/or Complexity of Data Reviewed Labs: ordered. Radiology: ordered.  Risk Prescription drug management. Decision regarding hospitalization.   52 yo  F with exertional chest pain going on for the past week but has not gone away with rest since last night.  She has a recent history of an LAD stent that was placed a little bit less than 2 months ago.  Having ongoing pain here.  Troponin negative though her troponins were negative when she had a stent placed.  Will discuss with cardiology.  Discussed with Dr. Elease Hashimoto, cardiology.  Cardiology did come and evaluate.  Recommended medical admission.   The patients results and plan were reviewed and discussed.   Any x-rays performed were independently reviewed by myself.   Differential diagnosis were considered with the presenting HPI.  Medications  ondansetron (ZOFRAN) injection 4 mg (4 mg Intravenous Not Given  07/29/23 1854)  ticagrelor (BRILINTA) tablet 90 mg (90 mg Oral Given 07/29/23 1925)  nitroGLYCERIN 50 mg in dextrose 5 % 250 mL (0.2 mg/mL) infusion (15 mcg/min Intravenous Infusion Verify 07/29/23 2104)  heparin ADULT infusion 100 units/mL (25000 units/26mL) (900 Units/hr Intravenous Infusion Verify 07/29/23 2027)  sodium chloride flush (NS) 0.9 % injection 3 mL (3 mLs Intravenous Not Given 07/29/23 2126)  acetaminophen (TYLENOL) tablet 650 mg (has no administration in time range)  Or  acetaminophen (TYLENOL) suppository 650 mg (has no administration in time range)  promethazine (PHENERGAN) tablet 12.5 mg (has no administration in time range)  polyethylene glycol (MIRALAX / GLYCOLAX) packet 17 g (has no administration in time range)  albuterol (PROVENTIL) (2.5 MG/3ML) 0.083% nebulizer solution 2.5 mg (has no administration in time range)  atorvastatin (LIPITOR) tablet 80 mg (has no administration in time range)  pantoprazole (PROTONIX) EC tablet 80 mg (has no administration in time range)  linaclotide (LINZESS) capsule 144 mcg (has no administration in time range)  montelukast (SINGULAIR) tablet 10 mg (has no administration in time range)  morphine (PF) 2 MG/ML injection 1 mg (has no administration in time range)  heparin bolus via infusion 4,000 Units (4,000 Units Intravenous Bolus from Bag 07/29/23 1930)    Vitals:   07/29/23 2045 07/29/23 2048 07/29/23 2050 07/29/23 2100  BP: 122/89  (!) 106/47 120/64  Pulse:      Resp:   17 20  Temp:  98.1 F (36.7 C)    TempSrc:  Oral    SpO2:      Weight:      Height:        Final diagnoses:  Chest pain with high risk for cardiac etiology    Admission/ observation were discussed with the admitting physician, patient and/or family and they are comfortable with the plan.         Final Clinical Impression(s) / ED Diagnoses Final diagnoses:  Chest pain with high risk for cardiac etiology    Rx / DC Orders ED Discharge Orders      None         Melene Plan, DO 07/29/23 2221

## 2023-07-29 NOTE — Telephone Encounter (Signed)
Pt called complaining of chest pain " something sitting on my chest", Dyspnea on exertion, no relief with 2 nitroglycerins. Encouraged pt to go to urgent care or ER for evaluation. Pt verbalized understanding and had no further questions.

## 2023-07-29 NOTE — ED Triage Notes (Addendum)
Pt c/o mid-sternal chest pain that started last night. Pt had stent placed on 11/22. Pt states she took ntg x 3 overnight and continued to have pain and feeling like her heart was racing. Pt describes pain as mid-sternal radiating into back and upper shoulders. Pt has had nausea and shortness of breath, pt denies vomiting. Pt seen for similar complaints recently.

## 2023-07-29 NOTE — Progress Notes (Signed)
PHARMACY - ANTICOAGULATION CONSULT NOTE  Pharmacy Consult for heparin infusion Indication: chest pain/ACS  Allergies  Allergen Reactions   Albuterol Sulfate Shortness Of Breath    Other reaction(s): Other (See Comments)  can't breathe  Other reaction(s): Other (See Comments) can't breathe   Tape     Other reaction(s): Other (See Comments), Other (See Comments), Other (See Comments)  severe skin breakdown  Cast and Bandage Cover  severe skin breakdown  severe skin breakdown   Formoterol Other (See Comments)    Other reaction(s): Headaches  Other reaction(s): Headaches  Other reaction(s): Headaches  Headaches  Other reaction(s): Headaches Other reaction(s): Headaches    Other reaction(s): Headaches    Headaches   Hydrocodone Rash   Hydrocodone-Acetaminophen Rash   Nickel Rash and Other (See Comments)   Other Rash    cast and bandage use  cast and bandage use  cast and bandage use  cast and bandage use    cast and bandage use cast and bandage use   Oxycodone-Acetaminophen Other (See Comments)    headache   Salmeterol Rash   Strawberry Extract Hives    Full body rash from strawberry   Advair Hfa [Fluticasone-Salmeterol]     unknown   Proventil Hfa [Albuterol]     unknown   Asa [Aspirin] Rash   Cleocin [Clindamycin] Rash   Clindamycin/Lincomycin Rash   Fludrocortisone Acetate Rash   Gabapentin Rash   Hydrochlorothiazide Rash   Imipramine Rash   Ipratropium Bromide Rash   Medroxyprogesterone Rash   Meloxicam Rash   Metformin And Related Rash   Nystatin Rash   Penicillins Rash   Pred Forte [Prednisolone] Rash   Prednisone Rash   Solu-Medrol [Methylprednisolone] Rash   Sulfa Antibiotics Rash   Tegaderm Ag Mesh [Silver] Rash   Toradol [Ketorolac Tromethamine] Rash   Tramadol Rash    Patient Measurements: Height: 5\' 3"  (160 cm) Weight: 102.1 kg (225 lb) IBW/kg (Calculated) : 52.4 Heparin Dosing Weight: 76.5 kg  Vital Signs: Temp: 97.8 F (36.6  C) (12/20 1652) Temp Source: Oral (12/20 1325) BP: 130/83 (12/20 1730) Pulse Rate: 86 (12/20 1652)  Labs: Recent Labs    07/29/23 1420 07/29/23 1749  HGB 13.6  --   HCT 41.6  --   PLT 337  --   CREATININE 0.78  --   TROPONINIHS 3 3    Estimated Creatinine Clearance: 91.8 mL/min (by C-G formula based on SCr of 0.78 mg/dL).   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 Atopic dermatitis Formatting of this note might be different from the original. Formatting of this note might be different from the original. Atopic dermatitis Formatting of this note might be different from the original. Formatting of this note might be different from the original. Formatting of this note might be different from the or   Cataract 05/06/2015   Contusion of left wrist 07/08/2021   Crohn's disease (HCC) 04/21/2015   De Quervain's tenosynovitis, left 07/31/2021   Diabetes mellitus without complication (HCC)    Diabetes type 2, controlled (HCC) 04/21/2015   Dry eyes 05/06/2015   DVT (deep venous thrombosis) (HCC)    Dysfunction of both eustachian tubes 08/31/2012   Encounter for monitoring immunomodulating therapy  11/20/2021   Environmental allergies 08/12/2020   Esophageal candidiasis (HCC) 06/26/2022   Fibrositis 08/28/2014   Formatting of this note might be different from the original. Fibromyalgia Formatting of this note might be different from the original. Formatting of this note might be different from the original. Fibromyalgia   Flank pain 11/24/2020   Gastroesophageal reflux disease with esophagitis without hemorrhage 08/01/2019   Formatting of this note might be different from the original.  Formatting of this note might be different from the original. 07/2019: chronic symptoms of esophageal reflux since prior to sleeve gastrectomy surgery. Symptoms have worsened over the last 6 months with recent EGD findings of grade B esophagitis, irregular z line, erythematous mucosa, and evidence of sleeve gastrectomy. 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 Formatting of this note might be different from the original. 07/2019: 8 mm colonic polyp removed during colonoscopy, biopsy showed serrated polyp. Repeat colonoscopy recommended in one year. Formatting of this note might be different from the original. 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 Hypertension Formatting of this note might be different from the original. Formatting of this note might be different from the original. 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 Keratoconus Formatting of this note might be different from the original. Formatting of this note might be different from the original. Keratoconus Formatting of this note might be different from the original. Formatting of this note might be different from the original. Keratoconus Formatting of this note might be different from the or   Microhematuria 06/27/2015    Migraine 08/28/2014   Formatting of this note might be different from the original. Migraine Formatting of this note might be different from the original. Formatting of this note might be different from the original. Migraine Formatting of this note might be different from the original. Migraine Formatting of this note might be different from the original. Migraine Formatting of this note might be different from the or   Migraine without aura and without status migrainosus, not intractable 08/28/2014   Formatting of this note might be different from the original. Formatting of this note might be different from the original. Formatting of this note might be different from the original. Migraine Formatting of this note might be different from the original. Migraine Formatting of this note might be different from the original. Formatting of this note might be different from the original. Migraine F   Mild intermittent asthma without complication 10/30/2008   Formatting of this note might be different from the original. Formatting of this note might be different from the original. Formatting of this note might be different from the original. Asthma Formatting of this note might be different from the original. Asthma Formatting of this note might be different from the original. Formatting of this note might be  different from the original. 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 Formatting of this note might be different from the original. 01/25/2016 with Dr. Dionisio David at [facility]. [place] of this note might be different from the original. 01/25/2016 with Dr. Dionisio David at [facility]. [place] of this note might be different from the original. Formatting of this note might be different from    Seronegative inflammatory arthritis 08/28/2014   Formatting of this note might be different from the original. Formatting of this note might be different from the original. Arthritis; diagnosed as IBD related - 2012 Formatting of this note might be different from the original. Followed by rheumatologist. Had to change providers due to an insurance change earlier this year resulting in patient not being able to have Remicade for ~6 months. 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 Formatting of this note might be different from the original. Treated with PRN miralax. Recent colonoscopy 07/2019. Formatting of this note might be different from the original. Treated with PRN miralax. Recent colonoscopy 07/2019. Formatting of this note might be different from the original. Formatting of this note might be different f   Status post placement of ureteral stent 02/06/2021   Tooth infection 08/12/2020   Ureteral stone 01/20/2022   Urinary tract infection, site not specified 06/26/2022    Medications:  (Not in a hospital admission)   Assessment: 52 yo F presents to the ED with mid-sternal chest pain that started on 12/19 pm. Pt took nitroglycerin SL x3 overnight and continued to have chest pain. Pt is s/p PCI with DES to LAD on 07/01/23 and was initiated on ticagrelor x6 months. Pt does not take any anticoagulation at home.  Pharmacy consulted to dose heparin infusion per ACS protocol.   Hgb 13.6, Plt 337 No s/sx of bleeding  Goal of Therapy:  Heparin level 0.3-0.7 units/ml Monitor platelets by anticoagulation protocol: Yes   Plan:  Give heparin 4000 units IV bolus from infusion, then Initiate heparin infusion at 900 units/hr Check heparin level in 6 hours Monitor daily CBC, heparin level, and for s/sx of bleeding   Wilburn Cornelia, PharmD, BCPS Clinical Pharmacist 07/29/2023 7:23 PM   Please refer to AMION for pharmacy phone number

## 2023-07-29 NOTE — ED Notes (Signed)
Pt advised sort staff of increased CP and fatigue. Triage RN notified

## 2023-07-29 NOTE — H&P (Signed)
History and Physical    Casey [name]:[id number] DOB: Mar 06, 1971 DOA: 07/29/2023  PCP: Dominic Pea, PA-C  Patient coming from: Home  I have personally briefly reviewed patient's old medical records in [facility]  Chief Complaint: Chest pain  HPI: Casey [name] is a 52 y.o. female with medical history significant for CAD s/p DES to LAD 07/01/2023, moderate persistent asthma, HTN, HLD, RA on Simponi, complex hiatal hernia s/p gastric sleeve, Hx of LUE DVT no longer on anticoagulation who presented to the ED for evaluation of chest pain.  Patient recently admitted 06/29/2023-07/02/2023 for chest pain.  She underwent cardiac cath 11/22 which noted proximal LAD to mid LAD lesion with 85% stenosis.  This was treated with deployment of a DES.  Previously she was on Plavix which was switched to 6 months of Brilinta with plan to transition back to Plavix alone due to reported allergy to aspirin.  EF 55-65% on cardiac cath.  Patient was chest pain-free on discharge.  Patient states that she developed recurrent midsternal chest discomfort last night around 9:30 PM while she was at rest.  This was similar to her previous chest pain.  She took sublingual nitroglycerin with some transient relief.  Today she had continued and worsening pain with minimal exertion therefore she came to the ED for further evaluation.  She has had some nausea but no emesis.  Has had some chills but no diaphoresis or fevers.  She has had some shortness of breath during severe chest pain.  She denies any swelling to her lower extremities.  She reports a history of severe rash related to aspirin use when she was 52 years old.  ED Course  Labs/Imaging on admission: I have personally reviewed following labs and imaging studies.  Initial vitals showed BP 124/99, pulse 60, RR 18, temp 98.3 F, SpO2 100% on room air.  Labs show WBC 10.1, hemoglobin 13.6, platelets 337,000, sodium 139, potassium 3.5, bicarb 25, BUN 9,  creatinine 0.78, serum glucose 111, troponin 3 x 2.  2 view chest x-ray showed low lung volumes without focal consolidation, edema, or effusion.  No pneumothorax.  Cardiology were consulted and recommended medical admission and they will evaluate.  The hospitalist service was consulted admit.  Review of Systems: All systems reviewed and are negative except as documented in history of present illness above.   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 Atopic dermatitis Formatting of this note might be different from the original. Formatting of this note might be different from the original. Atopic dermatitis Formatting of this note might be different from the original. Formatting of this note might be different from the original. Formatting of this note might be different from the or   Cataract 05/06/2015   Contusion of left wrist 07/08/2021   Crohn's disease (HCC) 04/21/2015   De Quervain's tenosynovitis, left 07/31/2021   Diabetes mellitus without complication (HCC)    Diabetes type 2, controlled (HCC) 04/21/2015   Dry eyes 05/06/2015   DVT (deep venous thrombosis) (HCC)    Dysfunction of both eustachian tubes 08/31/2012   Encounter for monitoring immunomodulating therapy 11/20/2021   Environmental allergies 08/12/2020   Esophageal candidiasis (HCC) 06/26/2022   Fibrositis 08/28/2014  Formatting of this note might be different from the original. Fibromyalgia Formatting of this note might be different from the original. Formatting of this note might be different from the original. Fibromyalgia   Flank pain 11/24/2020   Gastroesophageal reflux disease with esophagitis without hemorrhage 08/01/2019    Formatting of this note might be different from the original. Formatting of this note might be different from the original. 07/2019: chronic symptoms of esophageal reflux since prior to sleeve gastrectomy surgery. Symptoms have worsened over the last 6 months with recent EGD findings of grade B esophagitis, irregular z line, erythematous mucosa, and evidence of sleeve gastrectomy. 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 Formatting of this note might be different from the original. 07/2019: 8 mm colonic polyp removed during colonoscopy, biopsy showed serrated polyp. Repeat colonoscopy recommended in one year. Formatting of this note might be different from the original. 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 Hypertension Formatting of this note might be different from the original. Formatting of this note might be different from the original. 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 Keratoconus Formatting of this note might be different from the original. Formatting of this note might be different from the original. Keratoconus Formatting of this note might be different from the original. Formatting of this note might be different from the original. Keratoconus Formatting of this  note might be different from the or   Microhematuria 06/27/2015   Migraine 08/28/2014   Formatting of this note might be different from the original. Migraine Formatting of this note might be different from the original. Formatting of this note might be different from the original. Migraine Formatting of this note might be different from the original. Migraine Formatting of this note might be different from the original. Migraine Formatting of this note might be different from the or   Migraine without aura and without status migrainosus, not intractable 08/28/2014   Formatting of this note might be different from the original. Formatting of this note might be different from the original. Formatting of this note might be different from the original. Migraine Formatting of this note might be different from the original. Migraine Formatting of this note might be different from the original. Formatting of this note might be different from the original. Migraine F   Mild intermittent asthma without complication 10/30/2008   Formatting of this note might be different from the original. Formatting of this note might be different from the original. Formatting of this note might be different from the original. Asthma Formatting of this note might be different from the original. Asthma Formatting of this note might be different from the original. Formatting of this note might be different from the original. 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 Formatting of this note might be different from the original. 01/25/2016 with Dr. Dionisio David at [facility] [place] of this note might be different from the original. 01/25/2016 with Dr. Dionisio David at [facility]. [place] of this note might be different from the original. Formatting of this note might be different from    Seronegative inflammatory arthritis 08/28/2014   Formatting of this note might be different from the original. Formatting of this note might be different from the original. Arthritis; diagnosed as IBD related - 2012 Formatting of this note might be different from the original. Followed by rheumatologist. Had to change providers due to an insurance change earlier this year resulting in patient not being able to have Remicade for ~6 months. 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 Formatting of this note might be different from the original. Treated with PRN miralax. Recent colonoscopy 07/2019. Formatting of this note might be different from the original. Treated with PRN miralax. Recent colonoscopy 07/2019. Formatting of this note might be different from the original. Formatting of this note might be different f   Status post placement of ureteral stent 02/06/2021   Tooth infection 08/12/2020   Ureteral stone 01/20/2022   Urinary tract infection, site not specified 06/26/2022    Past Surgical History:  Procedure Laterality Date   BACK SURGERY     L5-S1   CERVICAL CONE BIOPSY  04/2000   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   CORONARY STENT INTERVENTION N/A 07/01/2023   Procedure: CORONARY STENT INTERVENTION;  Surgeon: Iran Ouch, MD;  Location: [facility]  INVASIVE CV LAB;  Service: Cardiovascular;  Laterality: N/A;   EYE SURGERY     GASTRIC BYPASS  2017   Sleve   LAPAROSCOPIC GASTRIC SLEEVE RESECTION     LEFT HEART CATH AND CORONARY ANGIOGRAPHY N/A 07/01/2023   Procedure: LEFT HEART CATH AND CORONARY ANGIOGRAPHY;  Surgeon: Iran Ouch, MD;  Location: [facility] INVASIVE CV LAB;  Service: Cardiovascular;  Laterality: N/A;   Low back disc surgery     06/2000   TOTAL KNEE ARTHROPLASTY Right 11/2016    Social History:  reports that she has never smoked. She has never been exposed to tobacco smoke. She has never used smokeless tobacco. She reports current alcohol use. She reports that she does not use drugs.  Allergies  Allergen Reactions   Albuterol Sulfate Shortness Of Breath    Other reaction(s): Other (See Comments)  can't breathe  Other reaction(s): Other (See Comments) can't breathe   Tape     Other reaction(s): Other (See Comments), Other (See Comments), Other (See Comments)  severe skin breakdown  Cast and Bandage Cover  severe skin breakdown  severe skin breakdown   Formoterol Other (See Comments)    Other reaction(s): Headaches  Other reaction(s): Headaches  Other reaction(s): Headaches  Headaches  Other reaction(s): Headaches Other reaction(s): Headaches    Other reaction(s): Headaches    Headaches   Hydrocodone Rash   Hydrocodone-Acetaminophen Rash   Nickel Rash and Other (See Comments)   Other Rash    cast and bandage use  cast and bandage use  cast and bandage use  cast and bandage use    cast and bandage use cast and bandage use   Oxycodone-Acetaminophen Other (See Comments)    headache   Salmeterol Rash   Strawberry Extract Hives    Full body rash from strawberry   Advair Hfa [Fluticasone-Salmeterol]     unknown   Proventil Hfa [Albuterol]     unknown   Asa [Aspirin] Rash   Cleocin [Clindamycin] Rash   Clindamycin/Lincomycin Rash   Fludrocortisone Acetate Rash   Gabapentin Rash    Hydrochlorothiazide Rash   Imipramine Rash   Ipratropium Bromide Rash   Medroxyprogesterone Rash   Meloxicam Rash   Metformin And Related Rash   Nystatin Rash   Penicillins Rash   Pred Forte [Prednisolone] Rash   Prednisone Rash   Solu-Medrol [Methylprednisolone] Rash   Sulfa Antibiotics Rash   Tegaderm Ag Mesh [Silver] Rash   Toradol [Ketorolac Tromethamine] Rash   Tramadol Rash    Family History  Adopted: Yes  Problem Relation Age of Onset   Diabetes Paternal Grandmother    Diabetes Father    Hypertension Father    Diabetes Mother    Hypertension Mother    Hypertension Brother    Diabetes Brother    Hypertension Sister    Diabetes Sister    Cancer Other    Asthma Neg Hx    Allergic rhinitis Neg Hx    Atopy Neg Hx    Eczema Neg Hx      Prior to Admission medications   Medication Sig Start Date End Date Taking? Authorizing Provider  acetaminophen (TYLENOL) 500 MG tablet Take 1,000 mg by mouth every 12 (twelve) hours as needed for mild pain or moderate pain.    [provider]  albuterol (PROVENTIL) (2.5 MG/3ML) 0.083% nebulizer solution Take 3 mLs (2.5 mg total) by nebulization every 6 (six) hours as needed for wheezing or shortness of breath. 11/30/22   Olive Bass, FNP  amLODipine (NORVASC) 5 MG tablet Take 1 tablet (5 mg total) by mouth daily. 07/03/23   Lonia Blood, MD  atorvastatin (LIPITOR) 80 MG tablet Take 1 tablet (80 mg total) by mouth daily. 07/03/23   Lonia Blood, MD  B Complex-C (B-COMPLEX WITH VITAMIN C) tablet Take 1 tablet by mouth daily. 09/06/19   [provider]  Cholecalciferol (VITAMIN D3) 50 MCG (2000 UT) TABS Take 1 tablet by mouth daily.    [provider]  EPINEPHrine (EPIPEN 2-PAK) 0.3 mg/0.3 mL IJ SOAJ injection Inject 0.3 mg into the muscle as needed for anaphylaxis. 01/18/23   Marcelyn Bruins, MD  esomeprazole (NEXIUM) 40 MG capsule Take 1 capsule (40 mg total) by mouth 2 (two) times  daily before a meal. Patient taking differently: Take 40 mg by mouth daily. 11/30/22   Olive Bass, FNP  famotidine (PEPCID) 40 MG tablet Take 1 tablet (40 mg total) by mouth daily. 11/30/22   Olive Bass, FNP  fluconazole (DIFLUCAN) 100 MG tablet Take 1 tablet (100 mg total) by mouth daily. 07/15/23   Olive Bass, FNP  Golimumab [facility] ARIA IV) Inject 200 mg into the vein every 8 (eight) weeks.    [provider]  levocetirizine (XYZAL) 5 MG tablet Take 10 mg by mouth every evening.    [provider]  linaclotide Karlene Einstein) 72 MCG capsule Take 144 mcg by mouth daily before breakfast. 03/23/23   [provider]  mirabegron ER (MYRBETRIQ) 50 MG TB24 tablet Take 1 tablet (50 mg total) by mouth daily. 11/30/22   Olive Bass, FNP  montelukast (SINGULAIR) 10 MG tablet Take 1 tablet (10 mg total) by mouth at bedtime. Patient taking differently: Take 10 mg by mouth daily. 11/30/22   Olive Bass, FNP  nitroGLYCERIN (NITROSTAT) 0.4 MG SL tablet Place 1 tablet (0.4 mg total) under the tongue every 5 (five) minutes as needed for chest pain. 10/13/22   Georgeanna Lea, MD  OXYGEN Inhale 2 L into the lungs at bedtime.    [provider]  ticagrelor (BRILINTA) 90 MG TABS tablet Take 1 tablet (90 mg total) by mouth 2 (two) times daily. 07/01/23   Lonia Blood, MD  ZOLMitriptan (ZOMIG) 2.5 MG tablet Take 1 tablet (2.5 mg total) by mouth once for 1 dose. May repeat in 2 hours if headache persists or recurs. Patient taking differently: Take 2.5 mg by mouth daily as needed for migraine. 11/30/22 09/16/23  Olive Bass, FNP    Physical Exam: Vitals:   07/29/23 1652 07/29/23 1730 07/29/23 1830 07/29/23 1926  BP: (!) 140/100 130/83  125/83  Pulse: 86   83  Resp: 18 20 19 13   Temp: 97.8 F (36.6 C)     TempSrc:      SpO2: 100%   100%  Weight:      Height:       Constitutional: Resting in bed, NAD, calm,  comfortable Eyes: EOMI, lids and conjunctivae normal ENMT: Mucous membranes are moist. Posterior pharynx clear of any exudate or lesions.Normal dentition.  Neck: normal, supple, no masses. Respiratory: clear to auscultation bilaterally, no wheezing, no crackles. Normal respiratory effort. No accessory muscle use.  Cardiovascular: Regular rate and rhythm, no murmurs / rubs / gallops. No extremity edema. 2+ pedal pulses. Abdomen: no tenderness, no masses palpated.  Musculoskeletal: no clubbing / cyanosis. No joint deformity upper and lower extremities. Good ROM, no contractures. Normal muscle tone.  Skin: no rashes, lesions, ulcers. No induration Neurologic: Sensation intact. Strength 5/5 in all 4.  Psychiatric: Normal judgment and insight. Alert and oriented x 3. Normal mood.   EKG: Personally reviewed. Sinus rhythm, rate 84, no acute ischemic changes. Not significantly changed when compared to previous.  Assessment/Plan Principal Problem:   Unstable angina [facility]) Active Problems:   Essential hypertension   Coronary artery disease s/p PCI/DES to LAD 07/01/2023   Rheumatoid arthritis of multiple sites with 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 factor (HCC)   Hyperlipidemia   Moderate persistent asthma   Casey [name] is a 52 y.o. female with medical history significant for CAD s/p DES to LAD 07/01/2023, moderate persistent asthma, HTN, HLD, RA on Simponi, complex hiatal hernia s/p gastric sleeve, Hx of LUE DVT no longer on anticoagulation who is admitted with chest pain suspicious for unstable angina.  Cardiology consulting, started on heparin and nitroglycerin drips with plan for cardiac cath on 08/01/23.  Assessment and Plan: Chest pain/unstable angina CAD s/p DES to LAD 07/01/2023: Patient with recurrent unstable anginal symptoms.  Not on aspirin due to history of allergic reaction.  Has been on Brilinta since recent stenting.  Troponin is negative x 2.  EKG without significant ischemic  changes. -Cardiology following -Started on  IV heparin -Started on IV nitroglycerin -Continue Brilinta and atorvastatin -Cardiology anticipating heart catheterization on Monday 12/23  Moderate persistent asthma: Stable without wheezing.  Continue Singulair and albuterol nebulizer as needed.  Hypertension: Holding amlodipine while on nitro drip.  BP is currently stable.  Hyperlipidemia: Continue atorvastatin.  Rheumatoid arthritis: On Simponi every 8 weeks per [facility] rheumatology.  GERD: Continue PPI.   DVT prophylaxis: IV heparin Code Status: Full code, confirmed with patient on admission Family Communication: Discussed with patient, she has discussed with family Disposition Plan: From home and likely discharge to home pending clinical progress Consults called: Cardiology Severity of Illness: The appropriate patient status for this patient is INPATIENT. Inpatient status is judged to be reasonable and necessary in order to provide the required intensity of service to ensure the patient's safety. The patient's presenting symptoms, physical exam findings, and initial radiographic and laboratory data in the context of their chronic comorbidities is felt to place them at high risk for further clinical deterioration. Furthermore, it is not anticipated that the patient will be medically stable for discharge from the hospital within 2 midnights of admission.   * I certify that at the point of admission it is my clinical judgment that the patient will require inpatient hospital care spanning beyond 2 midnights from the point of admission due to high intensity of service, high risk for further deterioration and high frequency of surveillance required.Darreld Mclean MD [facility]  If 7PM-7AM, please contact night-coverage [email or web address]  07/29/2023, 7:48 PM

## 2023-07-29 NOTE — Hospital Course (Signed)
Casey [name] is a 52 y.o. female with medical history significant for CAD s/p DES to LAD 07/01/2023, moderate persistent asthma, HTN, HLD, RA on Simponi, complex hiatal hernia s/p gastric sleeve, Hx of LUE DVT no longer on anticoagulation who is admitted with chest pain suspicious for unstable angina.  Cardiology consulting, started on heparin and nitroglycerin drips with plan for cardiac cath on 08/01/23.

## 2023-07-29 NOTE — Telephone Encounter (Signed)
   Pt c/o of Chest Pain: STAT if active CP, including tightness, pressure, jaw pain, radiating pain to shoulder/upper arm/back, CP unrelieved by Nitro. Symptoms reported of SOB, nausea, vomiting, sweating.  1. Are you having CP right now? No     2. Are you experiencing any other symptoms (ex. SOB, nausea, vomiting, sweating)? No    3. Is your CP continuous or coming and going? Coming and going    4. Have you taken Nitroglycerin? Took 2    5. How long have you been experiencing CP? Since last week     6. If NO CP at time of call then end call with telling Pt to call back or call 911 if Chest pain returns prior to return call from triage team.   Pt is still scheduled for Tuesday 12/24 for appt but wanted to inform that she had chest pain again last night.

## 2023-07-29 NOTE — Consult Note (Signed)
Cardiology Consultation   Patient ID: TAMEKI NIESS MRN: [id number]; DOB: [birth year]  Admit date: 07/29/2023 Date of Consult: 07/29/2023  PCP:  Dimple Nanas   [facility] Providers Cardiologist:  Gypsy Balsam, MD   {     Patient Profile:   STACYANN KNIPPA is a 52 y.o. female with a hx of  CAD  who is being seen 07/29/2023 for the evaluation of  chest pain  at the request of  Dr. Adela Lank .  History of Present Illness:   Ms. Ganter has a hx of PCI to her LAD recently  She presents with frequent episodes of cp for the past several days . Worse since last night  Fairly  constant since last night  NTG helped the pain but it returned.   When she presented several weeks ago with her angina she had fairly constant chest pain and had negative troponin levels at that time.  Stenting of her LAD 07/01/23 Has had exertional cp since discharge Is on brilinta 90 BID .  No on asa due to an allergy    Current cp if 5/10  Slightly worse with deep breath    Hx of gastric bypass    Past Medical History:  Diagnosis Date   Albuminuria 06/27/2015   Anxiety 11/04/2014   Arm DVT (deep venous thromboembolism), acute, left (HCC) 08/17/2022   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/30/2014   Asthma    Atopic dermatitis 10/30/2008   Formatting of this note might be different from the original. Atopic dermatitis Formatting of this note might be different from the original. Formatting of this note might be different from the original. Atopic dermatitis Formatting of this note might be different from the original. Formatting of this note might be different from the original. Formatting of this note might be different from the or   Cataract 05/06/2015   Contusion of left wrist 07/08/2021   Crohn's disease (HCC) 04/21/2015   De Quervain's tenosynovitis, left 07/31/2021   Diabetes mellitus without complication (HCC)    Diabetes type 2, controlled (HCC)  04/21/2015   Dry eyes 05/06/2015   DVT (deep venous thrombosis) (HCC)    Dysfunction of both eustachian tubes 08/31/2012   Encounter for monitoring immunomodulating therapy 11/20/2021   Environmental allergies 08/12/2020   Esophageal candidiasis (HCC) 06/26/2022   Fibrositis 08/28/2014   Formatting of this note might be different from the original. Fibromyalgia Formatting of this note might be different from the original. Formatting of this note might be different from the original. Fibromyalgia   Flank pain 11/24/2020   Gastroesophageal reflux disease with esophagitis without hemorrhage 08/01/2019   Formatting of this note might be different from the original. Formatting of this note might be different from the original. 07/2019: chronic symptoms of esophageal reflux since prior to sleeve gastrectomy surgery. Symptoms have worsened over the last 6 months with recent EGD findings of grade B esophagitis, irregular z line, erythematous mucosa, and evidence of sleeve gastrectomy. 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 Formatting of this note might be different from the original. 07/2019: 8 mm colonic polyp removed during colonoscopy, biopsy showed serrated polyp. Repeat colonoscopy recommended in one year. Formatting of this note might  be different from the original. 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 Hypertension Formatting of this note might be  different from the original. Formatting of this note might be different from the original. 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 Keratoconus Formatting of this note might be different from the original. Formatting of this note might be different from the original. Keratoconus Formatting of this note might be different from the original. Formatting of this note might be different from the original. Keratoconus Formatting of this note might be different from the or   Microhematuria 06/27/2015   Migraine 08/28/2014   Formatting of this note might be different from the original. Migraine Formatting of this note might be different from the original. Formatting of this note might be different from the original. Migraine Formatting of this note might be different from the original. Migraine Formatting of this note might be different from the original. Migraine Formatting of this note might be different from the or   Migraine without aura and without status migrainosus, not intractable 08/28/2014   Formatting of this note might be different from the original. Formatting of this note might be different from the original. Formatting of this note might be different from the original. Migraine Formatting of this note might be different from the original. Migraine Formatting of this note might be different from the original. Formatting of this note might be different from the original. Migraine F   Mild intermittent asthma without complication 10/30/2008   Formatting of this note might be different from the original. Formatting of this note might be different from the original. Formatting of this note might be different from the original. Asthma Formatting of this note might be different from the original. Asthma Formatting of this note  might be different from the original. Formatting of this note might be different from the original. 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 Formatting of this note might be different from the original. 01/25/2016 with Dr. Dionisio David at [facility]. [place] of this note might be different from the original. 01/25/2016 with Dr. Dionisio David at [facility]. [place] of this note might be different from the original. Formatting of this note might be different from    Seronegative inflammatory arthritis 08/28/2014   Formatting of this note might be different from the original. Formatting of this note might be different from the original. Arthritis; diagnosed as IBD related - 2012 Formatting of this  note might be different from the original. Followed by rheumatologist. Had to change providers due to an insurance change earlier this year resulting in patient not being able to have Remicade for ~6 months. 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 Formatting of this note might be different from the original. Treated with PRN miralax. Recent colonoscopy 07/2019. Formatting of this note might be different from the original.  Treated with PRN miralax. Recent colonoscopy 07/2019. Formatting of this note might be different from the original. Formatting of this note might be different f   Status post placement of ureteral stent 02/06/2021   Tooth infection 08/12/2020   Ureteral stone 01/20/2022   Urinary tract infection, site not specified 06/26/2022    Past Surgical History:  Procedure Laterality Date   BACK SURGERY     L5-S1   CERVICAL CONE BIOPSY  04/2000   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   CORONARY STENT INTERVENTION N/A 07/01/2023   Procedure: CORONARY STENT INTERVENTION;  Surgeon: Iran Ouch, MD;  Location: [facility];  Service: Cardiovascular;  Laterality: N/A;   EYE SURGERY     GASTRIC BYPASS  2017   Sleve   LAPAROSCOPIC GASTRIC SLEEVE RESECTION     LEFT HEART CATH AND CORONARY ANGIOGRAPHY N/A 07/01/2023   Procedure: LEFT HEART CATH AND CORONARY ANGIOGRAPHY;  Surgeon: Iran Ouch, MD;  Location: [facility];  Service: Cardiovascular;  Laterality: N/A;   Low back disc surgery     06/2000   TOTAL KNEE ARTHROPLASTY Right 11/2016     Home Medications:  Prior to Admission medications   Medication Sig Start Date End Date Taking? Authorizing Provider  acetaminophen (TYLENOL) 500 MG tablet Take 1,000 mg by mouth every 12 (twelve) hours as needed for mild pain or moderate pain.    [provider]  albuterol (PROVENTIL) (2.5 MG/3ML) 0.083% nebulizer solution Take 3 mLs (2.5 mg total) by nebulization every 6 (six) hours as needed for wheezing or shortness of breath. 11/30/22   Olive Bass, FNP  amLODipine (NORVASC) 5 MG tablet Take 1 tablet (5 mg total) by mouth daily. 07/03/23   Lonia Blood, MD  atorvastatin (LIPITOR) 80 MG tablet Take 1 tablet (80 mg total) by mouth daily. 07/03/23   Lonia Blood, MD  B Complex-C (B-COMPLEX WITH VITAMIN C) tablet Take 1 tablet by mouth daily. 09/06/19   [provider]  Cholecalciferol (VITAMIN D3) 50 MCG  (2000 UT) TABS Take 1 tablet by mouth daily.    [provider]  EPINEPHrine (EPIPEN 2-PAK) 0.3 mg/0.3 mL IJ SOAJ injection Inject 0.3 mg into the muscle as needed for anaphylaxis. 01/18/23   Marcelyn Bruins, MD  esomeprazole (NEXIUM) 40 MG capsule Take 1 capsule (40 mg total) by mouth 2 (two) times daily before a meal. Patient taking differently: Take 40 mg by mouth daily. 11/30/22   Olive Bass, FNP  famotidine (PEPCID) 40 MG tablet Take 1 tablet (40 mg total) by mouth daily. 11/30/22   Olive Bass, FNP  fluconazole (DIFLUCAN) 100 MG tablet Take 1 tablet (100 mg total) by mouth daily. 07/15/23   Olive Bass, FNP  Golimumab [facility] ARIA IV) Inject 200 mg into the vein every 8 (eight) weeks.    [provider]  levocetirizine (XYZAL) 5 MG tablet Take 10 mg by mouth every evening.    [provider]  linaclotide (LINZESS) 72 MCG capsule Take 144 mcg by mouth daily before breakfast. 03/23/23   [provider]  mirabegron ER (MYRBETRIQ) 50 MG TB24 tablet Take 1 tablet (50 mg total) by mouth daily. 11/30/22   Olive Bass, FNP  montelukast (SINGULAIR) 10 MG tablet Take 1 tablet (10 mg total) by mouth at bedtime. Patient taking differently: Take 10 mg by mouth daily. 11/30/22   Olive Bass, FNP  nitroGLYCERIN (NITROSTAT) 0.4 MG SL tablet Place 1 tablet (0.4 mg total) under the tongue every 5 (five) minutes as needed for chest pain. 10/13/22   Georgeanna Lea, MD  OXYGEN Inhale 2 L into the lungs at bedtime.    [provider]  ticagrelor (BRILINTA) 90 MG TABS tablet Take 1 tablet (90 mg total) by mouth 2 (two) times daily. 07/01/23   Lonia Blood, MD  ZOLMitriptan (ZOMIG) 2.5 MG tablet Take 1 tablet (2.5 mg total) by mouth once for 1 dose. May repeat in 2 hours if headache persists or recurs. Patient taking differently: Take 2.5 mg by mouth daily as needed for migraine. 11/30/22 09/16/23  Olive Bass, FNP    Inpatient Medications: Scheduled Meds:   morphine injection  4 mg Intravenous Once   ondansetron (ZOFRAN) IV  4 mg Intravenous Once   Continuous Infusions:  PRN Meds:   Allergies:    Allergies  Allergen Reactions   Albuterol Sulfate Shortness Of Breath    Other reaction(s): Other (See Comments)  can't breathe  Other reaction(s): Other (See Comments) can't breathe   Tape     Other reaction(s): Other (See Comments), Other (See Comments), Other (See Comments)  severe skin breakdown  Cast and Bandage Cover  severe skin breakdown  severe skin breakdown   Formoterol Other (See Comments)    Other reaction(s): Headaches  Other reaction(s): Headaches  Other reaction(s): Headaches  Headaches  Other reaction(s): Headaches Other reaction(s): Headaches    Other reaction(s): Headaches    Headaches   Hydrocodone Rash   Hydrocodone-Acetaminophen Rash   Nickel Rash and Other (See Comments)   Other Rash    cast and bandage use  cast and bandage use  cast and bandage use  cast and bandage use    cast and bandage use cast and bandage use   Oxycodone-Acetaminophen Other (See Comments)    headache   Salmeterol Rash   Strawberry Extract Hives    Full body rash from strawberry   Advair Hfa [Fluticasone-Salmeterol]     unknown   Proventil Hfa [Albuterol]     unknown   Asa [Aspirin] Rash   Cleocin [Clindamycin] Rash   Clindamycin/Lincomycin Rash   Fludrocortisone Acetate Rash   Gabapentin Rash   Hydrochlorothiazide Rash   Imipramine Rash   Ipratropium Bromide Rash   Medroxyprogesterone Rash   Meloxicam Rash   Metformin And Related Rash   Nystatin Rash   Penicillins Rash   Pred Forte [Prednisolone] Rash   Prednisone Rash   Solu-Medrol [Methylprednisolone] Rash   Sulfa Antibiotics Rash   Tegaderm Ag Mesh [Silver] Rash   Toradol [Ketorolac Tromethamine] Rash   Tramadol Rash    Social History:   Social History   Socioeconomic History    Marital status: Married    Spouse name: Not on file   Number of children: Not on file   Years of education: Not on file   Highest education level: Professional school degree (e.g., MD, DDS, DVM, JD)  Occupational History  Not on file  Tobacco Use   Smoking status: Never    Passive exposure: Never   Smokeless tobacco: Never  Vaping Use   Vaping status: Never Used  Substance and Sexual Activity   Alcohol use: Yes    Comment: rare   Drug use: Never   Sexual activity: Yes  Other Topics Concern   Not on file  Social History Narrative   Not on file   Social Drivers of Health   Financial Resource Strain: High Risk (07/14/2023)   Overall Financial Resource Strain (CARDIA)    Difficulty of Paying Living Expenses: Hard  Food Insecurity: No Food Insecurity (07/14/2023)   Hunger Vital Sign    Worried About Running Out of Food in the Last Year: Never true    Ran Out of Food in the Last Year: Never true  Transportation Needs: No Transportation Needs (07/14/2023)   PRAPARE - Administrator, Civil Service (Medical): No    Lack of Transportation (Non-Medical): No  Physical Activity: Unknown (07/14/2023)   Exercise Vital Sign    Days of Exercise per Week: 0 days    Minutes of Exercise per Session: Not on file  Stress: No Stress Concern Present (07/14/2023)   [employer] of Occupational Health - Occupational Stress Questionnaire    Feeling of Stress : Not at all  Social Connections: Moderately Isolated (07/14/2023)   Social Connection and Isolation Panel [NHANES]    Frequency of Communication with Friends and Family: More than three times a week    Frequency of Social Gatherings with Friends and Family: Once a week    Attends Religious Services: Never    Database administrator or Organizations: No    Attends Engineer, structural: Not on file    Marital Status: Married  Catering manager Violence: Not At Risk (06/29/2023)   Humiliation, Afraid, Rape, and Kick  questionnaire    Fear of Current or Ex-Partner: No    Emotionally Abused: No    Physically Abused: No    Sexually Abused: No    Family History:    Family History  Adopted: Yes  Problem Relation Age of Onset   Diabetes Paternal Grandmother    Diabetes Father    Hypertension Father    Diabetes Mother    Hypertension Mother    Hypertension Brother    Diabetes Brother    Hypertension Sister    Diabetes Sister    Cancer Other    Asthma Neg Hx    Allergic rhinitis Neg Hx    Atopy Neg Hx    Eczema Neg Hx      ROS:  Please see the history of present illness.   All other ROS reviewed and negative.     Physical Exam/Data:   Vitals:   07/29/23 1330 07/29/23 1335 07/29/23 1652 07/29/23 1730  BP:  (!) 124/99 (!) 140/100 130/83  Pulse:  60 86   Resp:  18 18 20   Temp:   97.8 F (36.6 C)   TempSrc:      SpO2:  100% 100%   Weight: 102.1 kg     Height: 5\' 3"  (1.6 m)      No intake or output data in the 24 hours ending 07/29/23 1812    07/29/2023    1:30 PM 07/21/2023    9:54 AM 07/18/2023   10:32 AM  Last 3 Weights  Weight (lbs) 225 lb 221 lb 9 oz 221 lb 4 oz  Weight (kg) 102.059  kg 100.5 kg 100.358 kg     Body mass index is 39.86 kg/m.  General:  moderately obese , middle age female,  in no acute distress HEENT: normal Neck: no JVD Vascular: No carotid bruits; Distal pulses 2+ bilaterally Cardiac:  normal S1, S2; RRR; no murmur  Lungs:  clear to auscultation bilaterally, no wheezing, rhonchi or rales  Abd: soft, nontender, no hepatomegaly  Ext: no edema Musculoskeletal:  No deformities, BUE and BLE strength normal and equal Skin: warm and dry  Neuro:  CNs 2-12 intact, no focal abnormalities noted Psych:  Normal affect   EKG:  The EKG was personally reviewed and demonstrates:   Normal sinus rhythm at 84.  Nonspecific ST and T wave changes.   Telemetry:  Telemetry was personally reviewed and demonstrates:  nsr   Relevant CV Studies:   Laboratory  Data:  High Sensitivity Troponin:   Recent Labs  Lab 07/29/23 1420  TROPONINIHS 3     Chemistry Recent Labs  Lab 07/29/23 1420  NA 139  K 3.5  CL 103  CO2 25  GLUCOSE 111*  BUN 9  CREATININE 0.78  CALCIUM 9.4  GFRNONAA >60  ANIONGAP 11    No results for input(s): "PROT", "ALBUMIN", "AST", "ALT", "ALKPHOS", "BILITOT" in the last 168 hours. Lipids No results for input(s): "CHOL", "TRIG", "HDL", "LABVLDL", "LDLCALC", "CHOLHDL" in the last 168 hours.  Hematology Recent Labs  Lab 07/29/23 1420  WBC 10.1  RBC 5.09  HGB 13.6  HCT 41.6  MCV 81.7  MCH 26.7  MCHC 32.7  RDW 17.1*  PLT 337   Thyroid No results for input(s): "TSH", "FREET4" in the last 168 hours.  BNPNo results for input(s): "BNP", "PROBNP" in the last 168 hours.  DDimer No results for input(s): "DDIMER" in the last 168 hours.   Radiology/Studies:  DG Chest 2 View Result Date: 07/29/2023 CLINICAL DATA:  Chest pain. EXAM: CHEST - 2 VIEW COMPARISON:  Chest radiograph dated June 29, 2023. FINDINGS: Low lung volume. The heart size and mediastinal contours are within normal limits. No focal consolidation, pleural effusion, or pneumothorax. No acute osseous abnormality. IMPRESSION: Low lung volumes.  No acute cardiopulmonary findings. Electronically Signed   By: Hart Robinsons M.D.   On: 07/29/2023 16:19     Assessment and Plan:   1.  Unstable angina: [name] presents for recurrent unstable angina.  She has had exertional angina since she left the hospital.  Now that angina is starting to occur with minimal to no exertion.  The pain is relieved with sublingual nitroglycerin but typically comes back.  Is described as a chest pressure and feels  exactly like her angina that she presented with several weeks ago.  Will start on heparin and IV nitroglycerin.  I am hoping that we can keep her stable for the weekend.  Will anticipate doing a heart catheterization on her on Monday.  2.  Hyperlipidemia: She is on  atorvastatin 80 mg a day LDL from June 01, 2023 was 94.  I suspect it is lower now.   Risk Assessment/Risk Scores:   TIMI Risk Score for Unstable Angina or Non-ST Elevation MI:   The patient's TIMI risk score is  , which indicates a  % risk of all cause mortality, new or recurrent myocardial infarction or need for urgent revascularization in the next 14 days.          For questions or updates, please contact [facility] Please consult [email or web address] for contact info under  Signed, Kristeen Miss, MD  07/29/2023 6:12 PM

## 2023-07-29 NOTE — ED Provider Triage Note (Signed)
Emergency Medicine Provider Triage Evaluation Note  Casey [name] , a 52 y.o. female  was evaluated in triage.  Pt complains of chest pain. States same came on suddenly around 9 pm last night and has been persistent since. She has taken several doses of nitroglycerin since then.  States that the nitroglycerin "takes the edge off" but does not completely resolve her pain.  Notes some dyspnea with exertion as well.  States that she recently had a stent placement on 11/22.  She was seen at that time for chest pain that she states is exactly the same as this, had negative work-up but was admitted given her risk factors and LAD stenosis was seen on cath and DES was placed.  Patient states that she has been having intermittent episodes of pain since her stent placement, however it has not been as severe or as long-lasting as this episode.  She denies fevers, chills, cough, congestion, nausea, vomiting, or diaphoresis. Pain is substernal in nature  Review of Systems  Positive:  Negative:   Physical Exam  BP (!) 124/99 (BP Location: Right Arm)   Pulse 60   Temp 98.3 F (36.8 C) (Oral)   Resp 18   Ht 5\' 3"  (1.6 m)   Wt 102.1 kg   SpO2 100%   BMI 39.86 kg/m  Gen:   Awake, no distress   Resp:  Normal effort  MSK:   Moves extremities without difficulty  Other:    Medical Decision Making  Medically screening exam initiated at 1:58 PM.  Appropriate orders placed.  Casey [name] was informed that the remainder of the evaluation will be completed by another provider, this initial triage assessment does not replace that evaluation, and the importance of remaining in the ED until their evaluation is complete.     Silva Bandy, PA-C 07/29/23 1402

## 2023-07-30 ENCOUNTER — Encounter (HOSPITAL_COMMUNITY): Payer: Self-pay | Admitting: Internal Medicine

## 2023-07-30 DIAGNOSIS — I200 Unstable angina: Secondary | ICD-10-CM | POA: Diagnosis not present

## 2023-07-30 LAB — BASIC METABOLIC PANEL
Anion gap: 13 (ref 5–15)
BUN: 9 mg/dL (ref 6–20)
CO2: 22 mmol/L (ref 22–32)
Calcium: 9.2 mg/dL (ref 8.9–10.3)
Chloride: 104 mmol/L (ref 98–111)
Creatinine, Ser: 0.71 mg/dL (ref 0.44–1.00)
GFR, Estimated: >60 mL/min (ref >60)
Glucose, Bld: 117 mg/dL — ABNORMAL HIGH (ref 70–99)
Potassium: 3.4 mmol/L — ABNORMAL LOW (ref 3.5–5.1)
Sodium: 139 mmol/L (ref 135–145)

## 2023-07-30 LAB — URINALYSIS, ROUTINE W REFLEX MICROSCOPIC
Bilirubin Urine: NEGATIVE
Glucose, UA: NEGATIVE mg/dL
Hgb urine dipstick: NEGATIVE
Ketones, ur: NEGATIVE mg/dL
Leukocytes,Ua: NEGATIVE
Nitrite: NEGATIVE
Protein, ur: 30 mg/dL — AB
Specific Gravity, Urine: 1.02 (ref 1.005–1.030)
pH: 5 (ref 5.0–8.0)

## 2023-07-30 LAB — HEPARIN LEVEL (UNFRACTIONATED)
Heparin Unfractionated: 0.13 [IU]/mL — ABNORMAL LOW (ref 0.30–0.70)
Heparin Unfractionated: 0.3 [IU]/mL (ref 0.30–0.70)
Heparin Unfractionated: 0.25 [IU]/mL — ABNORMAL LOW (ref 0.30–0.70)

## 2023-07-30 LAB — CBC
HCT: 39.4 % (ref 36.0–46.0)
Hemoglobin: 12.6 g/dL (ref 12.0–15.0)
MCH: 25.9 pg — ABNORMAL LOW (ref 26.0–34.0)
MCHC: 32 g/dL (ref 30.0–36.0)
MCV: 80.9 fL (ref 80.0–100.0)
Platelets: 304 10*3/uL (ref 150–400)
RBC: 4.87 MIL/uL (ref 3.87–5.11)
RDW: 17.3 % — ABNORMAL HIGH (ref 11.5–15.5)
WBC: 10.8 10*3/uL — ABNORMAL HIGH (ref 4.0–10.5)
nRBC: 0 % (ref 0.0–0.2)

## 2023-07-30 MED ORDER — HYDROCODONE-ACETAMINOPHEN 5-325 MG PO TABS: 1.0000 | ORAL_TABLET | ORAL | Status: DC

## 2023-07-30 MED ORDER — FLUCONAZOLE 100 MG PO TABS
100.0000 mg | ORAL_TABLET | Freq: Every day | ORAL | Status: DC
  Administered 2023-07-30 – 2023-07-31 (×2): 100 mg via ORAL
  Filled 2023-07-30 (×4): qty 1

## 2023-07-30 MED ORDER — METOCLOPRAMIDE HCL 5 MG/ML IJ SOLN
5.0000 mg | INTRAMUSCULAR | Status: DC
Start: 2023-07-30 — End: 2023-07-30

## 2023-07-30 MED ORDER — DIPHENHYDRAMINE HCL 50 MG/ML IJ SOLN: 12.5000 mg | INTRAMUSCULAR | Status: DC

## 2023-07-30 MED ORDER — POTASSIUM CHLORIDE CRYS ER 10 MEQ PO TBCR
30.0000 meq | EXTENDED_RELEASE_TABLET | ORAL | Status: AC
  Filled 2023-07-30: qty 1

## 2023-07-30 MED ORDER — OXYCODONE HCL 5 MG PO TABS
5.0000 mg | ORAL_TABLET | ORAL | Status: AC
  Administered 2023-07-30: 5 mg via ORAL
  Filled 2023-07-30: qty 1

## 2023-07-30 MED ORDER — FAMOTIDINE 20 MG PO TABS
40.0000 mg | ORAL_TABLET | Freq: Every day | ORAL | Status: DC
  Administered 2023-07-30 – 2023-08-02 (×4): 40 mg via ORAL
  Filled 2023-07-30 (×4): qty 2

## 2023-07-30 MED ORDER — KETOROLAC TROMETHAMINE 15 MG/ML IJ SOLN: 15.0000 mg | INTRAMUSCULAR | Status: DC

## 2023-07-30 MED ORDER — HEPARIN BOLUS VIA INFUSION
2000.0000 [IU] | Freq: Once | INTRAVENOUS | Status: AC
  Administered 2023-07-30: 2000 [IU] via INTRAVENOUS
  Filled 2023-07-30: qty 2000

## 2023-07-30 MED ORDER — DILTIAZEM HCL ER COATED BEADS 120 MG PO CP24
120.0000 mg | ORAL_CAPSULE | Freq: Every day | ORAL | Status: DC
  Administered 2023-07-30: 120 mg via ORAL
  Filled 2023-07-30 (×3): qty 1

## 2023-07-30 NOTE — Progress Notes (Signed)
PHARMACY - ANTICOAGULATION CONSULT NOTE  Pharmacy Consult for heparin infusion Indication: chest pain/ACS  Allergies  Allergen Reactions   Albuterol Sulfate Shortness Of Breath    Other reaction(s): Other (See Comments)  can't breathe  Other reaction(s): Other (See Comments) can't breathe   Tape     Other reaction(s): Other (See Comments), Other (See Comments), Other (See Comments)  severe skin breakdown  Cast and Bandage Cover  severe skin breakdown  severe skin breakdown   Formoterol Other (See Comments)    Other reaction(s): Headaches  Other reaction(s): Headaches  Other reaction(s): Headaches  Headaches  Other reaction(s): Headaches Other reaction(s): Headaches    Other reaction(s): Headaches    Headaches   Hydrocodone Rash   Hydrocodone-Acetaminophen Rash   Nickel Rash and Other (See Comments)   Other Rash    cast and bandage use  cast and bandage use  cast and bandage use  cast and bandage use    cast and bandage use cast and bandage use   Oxycodone-Acetaminophen Other (See Comments)    headache   Salmeterol Rash   Strawberry Extract Hives    Full body rash from strawberry   Advair Hfa [Fluticasone-Salmeterol]     unknown   Proventil Hfa [Albuterol]     unknown   Asa [Aspirin] Rash   Cleocin [Clindamycin] Rash   Clindamycin/Lincomycin Rash   Fludrocortisone Acetate Rash   Gabapentin Rash   Hydrochlorothiazide Rash   Imipramine Rash   Ipratropium Bromide Rash   Medroxyprogesterone Rash   Meloxicam Rash   Metformin And Related Rash   Nystatin Rash   Penicillins Rash   Pred Forte [Prednisolone] Rash   Prednisone Rash   Solu-Medrol [Methylprednisolone] Rash   Sulfa Antibiotics Rash   Tegaderm Ag Mesh [Silver] Rash   Toradol [Ketorolac Tromethamine] Rash   Tramadol Rash    Patient Measurements: Height: 5\' 3"  (160 cm) Weight: 102.1 kg (225 lb) IBW/kg (Calculated) : 52.4 Heparin Dosing Weight: 76.5 kg  Vital Signs: Temp: 98.2 F (36.8  C) (12/21 0625) Temp Source: Oral (12/21 0625) BP: 134/88 (12/21 0700) Pulse Rate: 75 (12/21 0700)  Labs: Recent Labs    07/29/23 1420 07/29/23 1749 07/30/23 0741  HGB 13.6  --  12.6  HCT 41.6  --  39.4  PLT 337  --  304  HEPARINUNFRC  --   --  0.13*  CREATININE 0.78  --   --   TROPONINIHS 3 3  --     Estimated Creatinine Clearance: 91.8 mL/min (by C-G formula based on SCr of 0.78 mg/dL).   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 Atopic dermatitis Formatting of this note might be different from the original. Formatting of this note might be different from the original. Atopic dermatitis Formatting of this note might be different from the original. Formatting of this note might be different from the original. Formatting of this note might be different from the or   Cataract 05/06/2015   Contusion of left wrist 07/08/2021   Crohn's disease (HCC) 04/21/2015   De Quervain's tenosynovitis, left 07/31/2021   Diabetes mellitus without complication (HCC)    Diabetes type 2, controlled (HCC) 04/21/2015   Dry eyes 05/06/2015   DVT (  deep venous thrombosis) (HCC)    Dysfunction of both eustachian tubes 08/31/2012   Encounter for monitoring immunomodulating therapy 11/20/2021   Environmental allergies 08/12/2020   Esophageal candidiasis (HCC) 06/26/2022   Fibrositis 08/28/2014   Formatting of this note might be different from the original. Fibromyalgia Formatting of this note might be different from the original. Formatting of this note might be different from the original. Fibromyalgia   Flank pain 11/24/2020   Gastroesophageal reflux disease with esophagitis without hemorrhage  08/01/2019   Formatting of this note might be different from the original. Formatting of this note might be different from the original. 07/2019: chronic symptoms of esophageal reflux since prior to sleeve gastrectomy surgery. Symptoms have worsened over the last 6 months with recent EGD findings of grade B esophagitis, irregular z line, erythematous mucosa, and evidence of sleeve gastrectomy. 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 Formatting of this note might be different from the original. 07/2019: 8 mm colonic polyp removed during colonoscopy, biopsy showed serrated polyp. Repeat colonoscopy recommended in one year. Formatting of this note might be different from the original. 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 Hypertension Formatting of this note might be different from the original. Formatting of this note might be different from the original. 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 Keratoconus Formatting of this note might be different from the original. Formatting of this note might be different from the original. Keratoconus Formatting of this note might be different from the original. Formatting of this note might be different from the original. Keratoconus  Formatting of this note might be different from the or   Microhematuria 06/27/2015   Migraine 08/28/2014   Formatting of this note might be different from the original. Migraine Formatting of this note might be different from the original. Formatting of this note might be different from the original. Migraine Formatting of this note might be different from the original. Migraine Formatting of this note might be different from the original. Migraine Formatting of this note might be different from the or   Migraine without aura and without status migrainosus, not intractable 08/28/2014   Formatting of this note might be different from the original. Formatting of this note might be different from the original. Formatting of this note might be different from the original. Migraine Formatting of this note might be different from the original. Migraine Formatting of this note might be different from the original. Formatting of this note might be different from the original. Migraine F   Mild intermittent asthma without complication 10/30/2008   Formatting of this note might be different from the original. Formatting of this note might be different from the original. Formatting of this note might be different from the original. Asthma Formatting of this note might be different  from the original. Asthma Formatting of this note might be different from the original. Formatting of this note might be different from the original. 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 Formatting of this note might be different from the original. 01/25/2016 with Dr. Dionisio David at [facility]. [place] of this note might be different from the original. 01/25/2016 with Dr. Dionisio David at [facility]. [place] of this note might be different from the original. Formatting of this note might be different from    Seronegative inflammatory arthritis 08/28/2014   Formatting of this note might be different from the original. Formatting of this note might be different from the original. Arthritis; diagnosed as IBD related - 2012 Formatting of this note might be different from the original. Followed by rheumatologist. Had to change providers due to an insurance change earlier this year resulting in patient not being able to have Remicade for ~6 months. 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 Formatting of this note might be different from the original. Treated with PRN miralax. Recent colonoscopy 07/2019. Formatting of this note might be different from the original. Treated with PRN miralax. Recent colonoscopy 07/2019. Formatting of this note might be different from the original. Formatting of this note might be different f   Status post placement of ureteral stent 02/06/2021   Tooth infection 08/12/2020   Ureteral stone 01/20/2022   Urinary tract infection, site not specified 06/26/2022    Medications:  (Not in a hospital admission)   Assessment: 52 yo F presents to the ED with mid-sternal chest pain that started on 12/19 pm. Pt took nitroglycerin SL x3 overnight and continued to have chest pain. Pt is s/p PCI with DES to LAD on 07/01/23 and was  initiated on ticagrelor x6 months. Pt does not take any anticoagulation at home. Pharmacy consulted to dose heparin infusion per ACS protocol.   Hgb 12.6, Plt 304 No s/sx of bleeding, no issues reported.  Heparin level subtherapeutic at 0.13.   Goal of Therapy:  Heparin level 0.3-0.7 units/ml Monitor platelets by anticoagulation protocol: Yes   Plan:  Give heparin 2000 units IV bolus from infusion, then Increase heparin infusion to 1100 units/hr Check heparin level in 6 hours Monitor daily CBC, heparin level, and for s/sx of bleeding  Cedric Fishman, PharmD, BCPS, BCCCP Clinical Pharmacist  Please refer to [facility] for pharmacy phone number

## 2023-07-30 NOTE — Progress Notes (Signed)
Progress Note  Patient Name: Casey [name] Date of Encounter: 07/30/2023 Primary Cardiologist: Gypsy Balsam, MD   Subjective   Overnight no events. Patient notes no CP, SOB, Palpitations. Notes many medication interactions. Notes that she has normal had normal labs and underlying disease with her RA.  Vital Signs    Vitals:   07/30/23 0945 07/30/23 1000 07/30/23 1100 07/30/23 1200  BP: (!) 139/97  (!) 136/98 130/83  Pulse:      Resp: 16  14 15   Temp:  98 F (36.7 C)    TempSrc:      SpO2:      Weight:      Height:        Intake/Output Summary (Last 24 hours) at 07/30/2023 1222 Last data filed at 07/29/2023 2104 Gross per 24 hour  Intake 54.17 ml  Output --  Net 54.17 ml   Filed Weights   07/29/23 1330  Weight: 102.1 kg    Physical Exam   GEN: No acute distress.  Morbid obesity Neck: No JVD Cardiac: RRR, no murmurs, rubs, or gallops.  Respiratory: Clear to auscultation bilaterally. GI: Soft, nontender, non-distended  MS: No edema  Labs   Telemetry: SR   Chemistry Recent Labs  Lab 07/29/23 1420 07/30/23 0741  NA 139 139  K 3.5 3.4*  CL 103 104  CO2 25 22  GLUCOSE 111* 117*  BUN 9 9  CREATININE 0.78 0.71  CALCIUM 9.4 9.2  GFRNONAA >60 >60  ANIONGAP 11 13     Hematology Recent Labs  Lab 07/29/23 1420 07/30/23 0741  WBC 10.1 10.8*  RBC 5.09 4.87  HGB 13.6 12.6  HCT 41.6 39.4  MCV 81.7 80.9  MCH 26.7 25.9*  MCHC 32.7 32.0  RDW 17.1* 17.3*  PLT 337 304    Cardiac EnzymesNo results for input(s): "TROPONINI" in the last 168 hours. No results for input(s): "TROPIPOC" in the last 168 hours.   BNPNo results for input(s): "BNP", "PROBNP" in the last 168 hours.   DDimer No results for input(s): "DDIMER" in the last 168 hours.   Cardiac Studies   Cardiac Studies & Procedures   CARDIAC CATHETERIZATION  CARDIAC CATHETERIZATION 07/01/2023  Narrative   Prox LAD to Mid LAD lesion is 85% stenosed.   A drug-eluting stent was  successfully placed using a SYNERGY XD 3.0X20.   Post intervention, there is a 0% residual stenosis.   The left ventricular systolic function is normal.   LV end diastolic pressure is mildly elevated.   The left ventricular ejection fraction is 55-65% by visual estimate.  1.  Severe one-vessel coronary artery disease with 85% stenosis in the proximal/mid LAD shortly after the origin of a large first diagonal.  The area is hazy and highly suggestive of plaque rupture. 2.  Normal LV systolic function and mildly elevated left ventricular end-diastolic pressure at 13 mmHg. 3.  Successful drug-eluting stent placement to the proximal/mid LAD.  Recommendations: The patient is allergic to aspirin.  Thus, we will switch from clopidogrel to Brilinta which should be continued for a minimum of 6 months.  After 6 months, she can be switched back to clopidogrel. Recommend aggressive treatment of risk factors.  Findings Coronary Findings Diagnostic  Dominance: Right  Left Main Vessel is angiographically normal.  Left Anterior Descending Prox LAD to Mid LAD lesion is 85% stenosed. Vessel is the culprit lesion. The lesion is type C and distal to major branch. The lesion is mildly calcified. The lesion was not  previously treated .  Second Diagonal Branch Vessel is small in size. Vessel is angiographically normal.  Third Diagonal Branch Vessel is small in size. Vessel is angiographically normal.  Left Circumflex The vessel exhibits minimal luminal irregularities.  First Obtuse Marginal Branch Vessel is small in size.  Second Obtuse Marginal Branch Vessel is large in size. Vessel is angiographically normal.  Right Coronary Artery The vessel exhibits minimal luminal irregularities.  Right Posterior Descending Artery Vessel is angiographically normal.  Right Posterior Atrioventricular Artery Vessel is angiographically normal.  Intervention  Prox LAD to Mid LAD lesion Stent Lesion  length:  20 mm. CATH LAUNCHER 6FR EBU3.5 guide catheter was inserted. Unable to cross lesion with guidewire using a WIRE RUNTHROUGH .[id number]. Pre-stent angioplasty was not performed. A drug-eluting stent was successfully placed using a SYNERGY XD 3.0X20. Maximum pressure: 14 atm. Inflation time: 20 sec. Post-stent angioplasty was performed using a BALL SAPPHIRE NC24 3.25X12. Maximum pressure:  16 atm. Inflation time:  20 sec. Post-Intervention Lesion Assessment The intervention was successful. Pre-interventional TIMI flow is 3. Post-intervention TIMI flow is 3. No complications occurred at this lesion. There is a 0% residual stenosis post intervention.    ECHOCARDIOGRAM  ECHOCARDIOGRAM COMPLETE 06/24/2023  Narrative ECHOCARDIOGRAM REPORT    Patient Name:   Casey [name] Date of Exam: 06/24/2023 Medical Rec #:  [id number]    Height:       63.0 in Accession #:    [id number]   Weight:       224.6 lb Date of Birth:  Jul 07, 1971    BSA:          2.031 m Patient Age:    52 years     BP:           118/82 mmHg Patient Gender: F            HR:           65 bpm. [facility]  Procedure: 2D Echo, Cardiac Doppler and Color Doppler  Indications:    R06.9 DOE  History:        Patient has prior history of Echocardiogram examinations, most recent 10/14/2022. CAD, Chron's disease, Signs/Symptoms:Chest Pain, Dizziness/Lightheadedness and Dyspnea; Risk Factors:Morbid obesity, Hypertension, Non-Smoker, Diabetes and Dyslipidemia. UE-DVT.  Sonographer:    Dominica Severin RCS, RVS Referring Phys: [phone] Marveen Reeks KRASOWSKI   Sonographer Comments: Global longitudinal strain was attempted. IMPRESSIONS   1. Left ventricular ejection fraction, by estimation, is 60 to 65%. The left ventricle has normal function. The left ventricle has no regional wall motion abnormalities. Left ventricular diastolic parameters are consistent with Grade II diastolic dysfunction (pseudonormalization). 2. Right  ventricular systolic function is normal. The right ventricular size is normal. Tricuspid regurgitation signal is inadequate for assessing PA pressure. 3. The mitral valve is grossly normal. Trivial mitral valve regurgitation. 4. The aortic valve is tricuspid. Aortic valve regurgitation is not visualized. No aortic stenosis is present. Aortic valve mean gradient measures 4.0 mmHg. 5. The inferior vena cava is normal in size with greater than 50% respiratory variability, suggesting right atrial pressure of 3 mmHg.  Comparison(s): Prior images reviewed side by side. LVEF normal range at 60-65% with moderate diastolic dysfunction.  FINDINGS Left Ventricle: Left ventricular ejection fraction, by estimation, is 60 to 65%. The left ventricle has normal function. The left ventricle has no regional wall motion abnormalities. Global longitudinal strain performed but not reported based on interpreter judgement due to suboptimal tracking. The left ventricular internal cavity size  was normal in size. There is borderline left ventricular hypertrophy. Left ventricular diastolic parameters are consistent with Grade II diastolic dysfunction (pseudonormalization).  Right Ventricle: The right ventricular size is normal. No increase in right ventricular wall thickness. Right ventricular systolic function is normal. Tricuspid regurgitation signal is inadequate for assessing PA pressure.  Left Atrium: Left atrial size was normal in size.  Right Atrium: Right atrial size was normal in size.  Pericardium: There is no evidence of pericardial effusion.  Mitral Valve: The mitral valve is grossly normal. Trivial mitral valve regurgitation. MV peak gradient, 3.0 mmHg. The mean mitral valve gradient is 1.0 mmHg.  Tricuspid Valve: The tricuspid valve is grossly normal. Tricuspid valve regurgitation is trivial.  Aortic Valve: The aortic valve is tricuspid. Aortic valve regurgitation is not visualized. No aortic stenosis is  present. Aortic valve mean gradient measures 4.0 mmHg. Aortic valve peak gradient measures 6.2 mmHg. Aortic valve area, by VTI measures 2.80 cm.  Pulmonic Valve: The pulmonic valve was grossly normal. Pulmonic valve regurgitation is not visualized.  Aorta: The aortic root and ascending aorta are structurally normal, with no evidence of dilitation.  Venous: The inferior vena cava is normal in size with greater than 50% respiratory variability, suggesting right atrial pressure of 3 mmHg.  IAS/Shunts: No atrial level shunt detected by color flow Doppler.   LEFT VENTRICLE PLAX 2D LVIDd:         4.30 cm     Diastology LVIDs:         2.60 cm     LV e' medial:    5.33 cm/s LV PW:         1.00 cm     LV E/e' medial:  18.3 LV IVS:        1.00 cm     LV e' lateral:   5.33 cm/s LVOT diam:     2.10 cm     LV E/e' lateral: 18.3 LV SV:         66 LV SV Index:   32 LVOT Area:     3.46 cm  LV Volumes (MOD) LV vol d, MOD A2C: 47.5 ml LV vol d, MOD A4C: 62.2 ml LV vol s, MOD A2C: 30.7 ml LV vol s, MOD A4C: 22.0 ml LV SV MOD A2C:     16.8 ml LV SV MOD A4C:     62.2 ml LV SV MOD BP:      28.9 ml  RIGHT VENTRICLE RV Basal diam:  2.60 cm RV Mid diam:    3.20 cm RV S prime:     15.70 cm/s TAPSE (M-mode): 2.7 cm  LEFT ATRIUM           Index        RIGHT ATRIUM           Index LA diam:      2.90 cm 1.43 cm/m   RA Area:     10.30 cm LA Vol (A4C): 26.3 ml 12.95 ml/m  RA Volume:   19.30 ml  9.50 ml/m AORTIC VALVE                    PULMONIC VALVE AV Area (Vmax):    2.56 cm     PV Vmax:       1.15 m/s AV Area (Vmean):   2.45 cm     PV Peak grad:  5.3 mmHg AV Area (VTI):     2.80 cm AV Vmax:  124.00 cm/s AV Vmean:          89.200 cm/s AV VTI:            0.235 m AV Peak Grad:      6.2 mmHg AV Mean Grad:      4.0 mmHg LVOT Vmax:         91.60 cm/s LVOT Vmean:        63.100 cm/s LVOT VTI:          0.190 m LVOT/AV VTI ratio: 0.81  AORTA Ao Root diam: 3.00 cm Ao Asc diam:   3.30 cm  MITRAL VALVE MV Area (PHT): 3.02 cm    SHUNTS MV Area VTI:   2.47 cm    Systemic VTI:  0.19 m MV Peak grad:  3.0 mmHg    Systemic Diam: 2.10 cm MV Mean grad:  1.0 mmHg MV Vmax:       0.86 m/s MV Vmean:      53.8 cm/s MV Decel Time: 251 msec MV E velocity: 97.30 cm/s MV A velocity: 83.10 cm/s MV E/A ratio:  1.17  Nona Dell MD Electronically signed by Nona Dell MD Signature Date/Time: 06/24/2023/4:46:03 PM    Final                Assessment & Plan   CAD - with HTN and RA - complicated by morbid obesity - she is having residual pain similar to her prior PCI - has exertional angina with rest pain that brought her here; does not smoke and has taker her medications - consented for LHC and planned for LHC per Dr. Elease Hashimoto 07/29/23 - will get CRP and Sed rate, if elevated will get echo and consider prior procedural pericarditis (would be atypical by sx and EKG) - will trial diltiazem and hold home norvasc - on heparin and nitroglycerin drip   For questions or updates, please contact [facility] Please consult [email or web address] for contact info under Cardiology/STEMI.      Riley Lam, MD FASE [facility] Cardiologist [facility]  [address] [place], [address] [place], [place] [address] [phone]  12:22 PM

## 2023-07-30 NOTE — Progress Notes (Signed)
PROGRESS NOTE    Casey [name]  NWG:[id number] DOB: 01-15-1971 DOA: 07/29/2023 PCP: Dominic Pea, PA-C    Brief Narrative:   Casey [name] is a 52 y.o. female with past medical history significant for CAD s/p DES to LAD 07/01/2023, moderate persistent asthma, HTN, HLD, RA on Simponi, complex hiatal hernia s/p gastric sleeve, Hx of LUE DVT no longer on anticoagulation who presented to the ED for evaluation of chest pain. Patient states that she developed recurrent midsternal chest discomfort last night around 9:30 PM while she was at rest.  This was similar to her previous chest pain.  She took sublingual nitroglycerin with some transient relief.  Today she had continued and worsening pain with minimal exertion therefore she came to the ED for further evaluation.  She has had some nausea but no emesis.  Has had some chills but no diaphoresis or fevers.  She has had some shortness of breath during severe chest pain.  She denies any swelling to her lower extremities.   Patient recently admitted 06/29/2023-07/02/2023 for chest pain.  She underwent cardiac cath 11/22 which noted proximal LAD to mid LAD lesion with 85% stenosis.  This was treated with deployment of a DES.  Previously she was on Plavix which was switched to 6 months of Brilinta with plan to transition back to Plavix alone due to reported allergy to aspirin.  EF 55-65% on cardiac cath.  Patient was chest pain-free on discharge.   She reports a history of severe rash related to aspirin use when she was 52 years old.   In the ED, BP 124/99, pulse 60, RR 18, temp 98.3 F, SpO2 100% on room air.   Labs show WBC 10.1, hemoglobin 13.6, platelets 337,000, sodium 139, potassium 3.5, bicarb 25, BUN 9, creatinine 0.78, serum glucose 111, troponin 3 x 2. Chest x-ray showed low lung volumes without focal consolidation, edema, or effusion.  No pneumothorax. Cardiology consulted and recommended medical admission and they will evaluate.  The  hospitalist service was consulted to admit for concerns of unstable angina.  Assessment & Plan:   Chest pain/unstable angina CAD s/p DES to LAD 07/01/2023: Patient with recurrent unstable anginal symptoms.  Not on aspirin due to history of allergic reaction.  Has been on Brilinta since recent stenting.  Troponin is negative x 2.  EKG without significant ischemic changes. -- Cardiology following, appreciate assistance --CRP/ESR: Pending -- Heparin drip, pharmacy consulted for dosing/monitoring -- IV nitroglycerin drip per cardiology -- Started on diltiazem 120 mg p.o. daily -- Continue Brilinta and atorvastatin -- Cardiology anticipating heart catheterization on Monday 12/23 -- Monitor on telemetry  Urinary tract infection Patient reports incomplete treatment for outpatient UTI, states was on Diflucan. -- Restart Diflucan 100 mg p.o. daily -- Check urinalysis/urine culture  Hypokalemia Potassium 3.4, will replete. -- Repeat electrolytes in a.m. to, magnesium   Moderate persistent asthma: Stable without wheezing.   -- Continue Singulair 10 mg p.o. daily -- albuterol nebulizer as needed.   Hypertension: Holding amlodipine while on nitro drip.  BP is currently stable. --Cardiology started diltiazem CD120 mg p.o. daily today   Hyperlipidemia: -- Continue atorvastatin 80 mg p.o. daily   Rheumatoid arthritis: On Simponi every 8 weeks per [facility] rheumatology.   GERD: --Protonix 80 mg p.o. daily (substituted for home Nexium) -- Famotidine 40 mg p.o. daily  Morbid obesity, class II Body mass index is 39.86 kg/m.  Complicates all facets of care   DVT prophylaxis: Heparin drip  Code Status: Full Code Family Communication: No family present at bedside this morning  Disposition Plan:  Level of care: Progressive Status is: Inpatient Remains inpatient appropriate because: IV nitroglycerin/heparin drip, pending left heart catheterization 12/23    Consultants:   Cardiology  Procedures:  None  Antimicrobials:  Fluconazole 12/21>>   Subjective: Patient seen examined bedside, remains in ED holding area.  Lying in bed.  Complaining of headache related to nitroglycerin drip.  Also wants her home Nexium restarted, discussed with her that this is not available in the hospital and has been substituted with Protonix.  Also reports that she believes her urinary tract infection was not completely treated outpatient, states she was on Diflucan.  Discussed will continue Diflucan and reorder urinalysis with culture today.  Seen by cardiology with plan for left heart catheterization on Monday.  Also checking inflammatory markers for possible underlying pericarditis.  No other specific complaints, concerns or questions at this time.  Denies visual changes, no dizziness, no shortness of breath, no abdominal pain, no cough/congestion, no fever/chills/night sweats, no nausea/vomiting/diarrhea, no focal weakness, no fatigue, no paresthesia.  No acute events overnight per nurse staff.  Objective: Vitals:   07/30/23 0945 07/30/23 1000 07/30/23 1100 07/30/23 1200  BP: (!) 139/97  (!) 136/98 130/83  Pulse:      Resp: 16  14 15   Temp:  98 F (36.7 C)    TempSrc:      SpO2:      Weight:      Height:        Intake/Output Summary (Last 24 hours) at 07/30/2023 1231 Last data filed at 07/29/2023 2104 Gross per 24 hour  Intake 54.17 ml  Output --  Net 54.17 ml   Filed Weights   07/29/23 1330  Weight: 102.1 kg    Examination:  Physical Exam: GEN: NAD, alert and oriented x 3, obese HEENT: NCAT, PERRL, EOMI, sclera clear, MMM PULM: CTAB w/o wheezes/crackles, normal respiratory effort, on room air CV: RRR w/o M/G/R GI: abd soft, NTND, NABS, no R/G/M MSK: no peripheral edema, muscle strength globally intact 5/5 bilateral upper/lower extremities NEURO: CN II-XII intact, no focal deficits, sensation to light touch intact PSYCH: normal mood/affect Integumentary:  dry/intact, no rashes or wounds    Data Reviewed: I have personally reviewed following labs and imaging studies  CBC: Recent Labs  Lab 07/29/23 1420 07/30/23 0741  WBC 10.1 10.8*  HGB 13.6 12.6  HCT 41.6 39.4  MCV 81.7 80.9  PLT 337 304   Basic Metabolic Panel: Recent Labs  Lab 07/29/23 1420 07/30/23 0741  NA 139 139  K 3.5 3.4*  CL 103 104  CO2 25 22  GLUCOSE 111* 117*  BUN 9 9  CREATININE 0.78 0.71  CALCIUM 9.4 9.2   GFR: Estimated Creatinine Clearance: 91.8 mL/min (by C-G formula based on SCr of 0.71 mg/dL). Liver Function Tests: No results for input(s): "AST", "ALT", "ALKPHOS", "BILITOT", "PROT", "ALBUMIN" in the last 168 hours. No results for input(s): "LIPASE", "AMYLASE" in the last 168 hours. No results for input(s): "AMMONIA" in the last 168 hours. Coagulation Profile: No results for input(s): "INR", "PROTIME" in the last 168 hours. Cardiac Enzymes: No results for input(s): "CKTOTAL", "CKMB", "CKMBINDEX", "TROPONINI" in the last 168 hours. BNP (last 3 results) No results for input(s): "PROBNP" in the last 8760 hours. HbA1C: No results for input(s): "HGBA1C" in the last 72 hours. CBG: No results for input(s): "GLUCAP" in the last 168 hours. Lipid Profile: No results for input(s): "  CHOL", "HDL", "LDLCALC", "TRIG", "CHOLHDL", "LDLDIRECT" in the last 72 hours. Thyroid Function Tests: No results for input(s): "TSH", "T4TOTAL", "FREET4", "T3FREE", "THYROIDAB" in the last 72 hours. Anemia Panel: No results for input(s): "VITAMINB12", "FOLATE", "FERRITIN", "TIBC", "IRON", "RETICCTPCT" in the last 72 hours. Sepsis Labs: No results for input(s): "PROCALCITON", "LATICACIDVEN" in the last 168 hours.  No results found for this or any previous visit (from the past 240 hours).       Radiology Studies: DG Chest 2 View Result Date: 07/29/2023 CLINICAL DATA:  Chest pain. EXAM: CHEST - 2 VIEW COMPARISON:  Chest radiograph dated June 29, 2023. FINDINGS: Low  lung volume. The heart size and mediastinal contours are within normal limits. No focal consolidation, pleural effusion, or pneumothorax. No acute osseous abnormality. IMPRESSION: Low lung volumes.  No acute cardiopulmonary findings. Electronically Signed   By: Hart Robinsons M.D.   On: 07/29/2023 16:19        Scheduled Meds:  atorvastatin  80 mg Oral Daily   diltiazem  120 mg Oral Daily   famotidine  40 mg Oral Daily   fluconazole  100 mg Oral Daily   linaclotide  144 mcg Oral QAC breakfast   montelukast  10 mg Oral Daily   ondansetron (ZOFRAN) IV  4 mg Intravenous Once   pantoprazole  80 mg Oral Q1200   sodium chloride flush  3 mL Intravenous Q12H   ticagrelor  90 mg Oral BID   Continuous Infusions:  heparin 1,100 Units/hr (07/30/23 0928)   nitroGLYCERIN 10 mcg/min (07/30/23 0931)     LOS: 1 day    Time spent: 52 minutes spent on chart review, discussion with nursing staff, consultants, updating family and interview/physical exam; more than 50% of that time was spent in counseling and/or coordination of care.    Alvira Philips [place], DO [facility] Available via Epic secure chat 7am-7pm After these hours, please refer to coverage provider listed on [email or web address] 07/30/2023, 12:31 PM

## 2023-07-30 NOTE — ED Notes (Signed)
.  ed

## 2023-07-30 NOTE — ED Notes (Addendum)
PT has been c/o of a headache, nitroglycerin was reduced per physician and a PRN tylenol was offered. PT declined Tylenol stating it does nothing for her Dow Adolph DO was then consulted and PT was given Toradol,Diphenhydramine and Reglan. PT refused all three. Raphael Gibney was consulted again and  PT was offered hydrocodone which PT agreed to .

## 2023-07-30 NOTE — Progress Notes (Signed)
ANTICOAGULATION CONSULT NOTE - Follow-up Note  Pharmacy Consult for Heparin Indication: chest pain/ACS  Allergies  Allergen Reactions   Albuterol Sulfate Shortness Of Breath    Other reaction(s): Other (See Comments)  can't breathe  Other reaction(s): Other (See Comments) can't breathe   Tape     Other reaction(s): Other (See Comments), Other (See Comments), Other (See Comments)  severe skin breakdown  Cast and Bandage Cover  severe skin breakdown  severe skin breakdown   Formoterol Other (See Comments)    Other reaction(s): Headaches  Other reaction(s): Headaches  Other reaction(s): Headaches  Headaches  Other reaction(s): Headaches Other reaction(s): Headaches    Other reaction(s): Headaches    Headaches   Hydrocodone Rash   Hydrocodone-Acetaminophen Rash   Nickel Rash and Other (See Comments)   Other Rash    cast and bandage use  cast and bandage use  cast and bandage use  cast and bandage use    cast and bandage use cast and bandage use   Oxycodone-Acetaminophen Other (See Comments)    headache   Salmeterol Rash   Strawberry Extract Hives    Full body rash from strawberry   Advair Hfa [Fluticasone-Salmeterol]     unknown   Proventil Hfa [Albuterol]     unknown   Asa [Aspirin] Rash   Cleocin [Clindamycin] Rash   Clindamycin/Lincomycin Rash   Fludrocortisone Acetate Rash   Gabapentin Rash   Hydrochlorothiazide Rash   Imipramine Rash   Ipratropium Bromide Rash   Medroxyprogesterone Rash   Meloxicam Rash   Metformin And Related Rash   Nystatin Rash   Penicillins Rash   Pred Forte [Prednisolone] Rash   Prednisone Rash   Solu-Medrol [Methylprednisolone] Rash   Sulfa Antibiotics Rash   Tegaderm Ag Mesh [Silver] Rash   Toradol [Ketorolac Tromethamine] Rash   Tramadol Rash    Patient Measurements: Height: 5\' 3"  (160 cm) Weight: 102.1 kg (225 lb) IBW/kg (Calculated) : 52.4 Heparin Dosing Weight: 76.5 kg  Vital Signs: Temp: 98.5 F (36.9 C)  (12/21 1403) Temp Source: Oral (12/21 1403) BP: 133/95 (12/21 1500) Pulse Rate: 85 (12/21 1403)  Labs: Recent Labs    07/29/23 1420 07/29/23 1749 07/30/23 0741 07/30/23 1453  HGB 13.6  --  12.6  --   HCT 41.6  --  39.4  --   PLT 337  --  304  --   HEPARINUNFRC  --   --  0.13* 0.30  CREATININE 0.78  --  0.71  --   TROPONINIHS 3 3  --   --     Estimated Creatinine Clearance: 91.8 mL/min (by C-G formula based on SCr of 0.71 mg/dL).   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 Atopic dermatitis Formatting of this note might be different from the original. Formatting of this note might be different from the original. Atopic dermatitis Formatting of this note might be different from the original. Formatting of this note might be different from the original. Formatting of this note might be different from the or   Cataract 05/06/2015   Contusion of left wrist 07/08/2021   Crohn's disease (HCC) 04/21/2015   De Quervain's tenosynovitis, left 07/31/2021   Diabetes mellitus without complication (HCC)  Diabetes type 2, controlled (HCC) 04/21/2015   Dry eyes 05/06/2015   DVT (deep venous thrombosis) (HCC)    Dysfunction of both eustachian tubes 08/31/2012   Encounter for monitoring immunomodulating therapy 11/20/2021   Environmental allergies 08/12/2020   Esophageal candidiasis (HCC) 06/26/2022   Fibrositis 08/28/2014   Formatting of this note might be different from the original. Fibromyalgia Formatting of this note might be different from the original. Formatting of this note might be different from the original. Fibromyalgia   Flank pain 11/24/2020   Gastroesophageal reflux disease  with esophagitis without hemorrhage 08/01/2019   Formatting of this note might be different from the original. Formatting of this note might be different from the original. 07/2019: chronic symptoms of esophageal reflux since prior to sleeve gastrectomy surgery. Symptoms have worsened over the last 6 months with recent EGD findings of grade B esophagitis, irregular z line, erythematous mucosa, and evidence of sleeve gastrectomy. 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 Formatting of this note might be different from the original. 07/2019: 8 mm colonic polyp removed during colonoscopy, biopsy showed serrated polyp. Repeat colonoscopy recommended in one year. Formatting of this note might be different from the original. 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 Hypertension Formatting of this note might be different from the original. Formatting of this note might be different from the original. 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 Keratoconus Formatting of this note might be different from the original. Formatting of this note might be different from the original. Keratoconus Formatting of this note might be different from the original. Formatting of this note might be different  from the original. Keratoconus Formatting of this note might be different from the or   Microhematuria 06/27/2015   Migraine 08/28/2014   Formatting of this note might be different from the original. Migraine Formatting of this note might be different from the original. Formatting of this note might be different from the original. Migraine Formatting of this note might be different from the original. Migraine Formatting of this note might be different from the original. Migraine Formatting of this note might be different from the or   Migraine without aura and without status migrainosus, not intractable 08/28/2014   Formatting of this note might be different from the original. Formatting of this note might be different from the original. Formatting of this note might be different from the original. Migraine Formatting of this note might be different from the original. Migraine Formatting of this note might be different from the original. Formatting of this note might be different from the original. Migraine F   Mild intermittent asthma without complication 10/30/2008   Formatting of this note might be different from the original. Formatting of this note might be different from the original. Formatting of this note  might be different from the original. Asthma Formatting of this note might be different from the original. Asthma Formatting of this note might be different from the original. Formatting of this note might be different from the original. 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 Formatting of this note might be different from the original. 01/25/2016 with Dr. Dionisio David at [facility] [place] of this note might be different from the original. 01/25/2016 with Dr. Dionisio David at [facility]. [place] of this note might be different from the original. Formatting of this note might be different from    Seronegative inflammatory arthritis 08/28/2014   Formatting of this note might be different from the original. Formatting of this note might be different from the original. Arthritis; diagnosed as IBD related - 2012 Formatting of this note might be different from the original. Followed by rheumatologist. Had to change providers due to an insurance change earlier this year resulting in patient not being able to have Remicade for ~6 months. 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 Formatting of this note might be different from the original. Treated with PRN miralax. Recent colonoscopy 07/2019. Formatting of this note might be different from the original. Treated with PRN miralax. Recent colonoscopy 07/2019. Formatting of this note might be different from the original. Formatting of this note might be different f   Status post placement of ureteral stent 02/06/2021   Tooth infection 08/12/2020   Ureteral stone 01/20/2022   Urinary tract infection, site not specified 06/26/2022    Medications:  (Not in a hospital admission)  Scheduled:   atorvastatin  80 mg Oral Daily   diltiazem  120 mg Oral Daily   famotidine  40 mg Oral Daily   fluconazole  100 mg Oral Daily   linaclotide  144 mcg Oral QAC  breakfast   montelukast  10 mg Oral Daily   ondansetron (ZOFRAN) IV  4 mg Intravenous Once   pantoprazole  80 mg Oral Q1200   potassium chloride  30 mEq Oral Q4H   sodium chloride flush  3 mL Intravenous Q12H   ticagrelor  90 mg Oral BID   Infusions:   heparin 1,100 Units/hr (07/30/23 1409)   nitroGLYCERIN 15 mcg/min (07/30/23 1404)   PRN: acetaminophen **OR** acetaminophen, albuterol, morphine injection, polyethylene glycol, promethazine  Assessment: 52 yo F presents to the ED with mid-sternal chest pain that started on 12/19 pm. Pt took nitroglycerin SL x3 overnight and continued to have chest pain. Pt is s/p PCI with DES to LAD on 07/01/23 and was initiated on ticagrelor x6 months. Pt does not take any anticoagulation at home. Pharmacy consulted to dose heparin infusion per [facility] protocol.   Patient was not on anticoagulation prior to arrival.   On 1100 units/hr  IV heparin following 2000 unit IV heparin bolus which was an increase following sub-therapeutic heparin level.  Resulting heparin level is 0.3 which is therapeutic.  No issues with infusion or bleeding per RN.  Hgb 12.6; plt 304  Goal of Therapy:  Heparin level 0.3-0.7 units/ml Monitor platelets by anticoagulation protocol: Yes   Plan:  Continue heparin infusion at 1100 units/hr Check anti-Xa level at 2100 and daily while on heparin Continue to monitor H&H and platelets  Delmar Landau, PharmD, BCPS 07/30/2023 3:39 PM ED Clinical Pharmacist -  [phone]

## 2023-07-30 NOTE — Progress Notes (Signed)
ANTICOAGULATION CONSULT NOTE  Pharmacy Consult for Heparin Indication: chest pain/ACS  Allergies  Allergen Reactions   Albuterol Sulfate Shortness Of Breath    Other reaction(s): Other (See Comments)  can't breathe  Other reaction(s): Other (See Comments) can't breathe   Tape     Other reaction(s): Other (See Comments), Other (See Comments), Other (See Comments)  severe skin breakdown  Cast and Bandage Cover  severe skin breakdown  severe skin breakdown   Formoterol Other (See Comments)    Other reaction(s): Headaches  Other reaction(s): Headaches  Other reaction(s): Headaches  Headaches  Other reaction(s): Headaches Other reaction(s): Headaches    Other reaction(s): Headaches    Headaches   Hydrocodone Rash   Hydrocodone-Acetaminophen Rash   Nickel Rash and Other (See Comments)   Other Rash    cast and bandage use  cast and bandage use  cast and bandage use  cast and bandage use    cast and bandage use cast and bandage use   Oxycodone-Acetaminophen Other (See Comments)    headache   Salmeterol Rash   Strawberry Extract Hives    Full body rash from strawberry   Advair Hfa [Fluticasone-Salmeterol]     unknown   Proventil Hfa [Albuterol]     unknown   Asa [Aspirin] Rash   Cleocin [Clindamycin] Rash   Clindamycin/Lincomycin Rash   Fludrocortisone Acetate Rash   Gabapentin Rash   Hydrochlorothiazide Rash   Imipramine Rash   Ipratropium Bromide Rash   Medroxyprogesterone Rash   Meloxicam Rash   Metformin And Related Rash   Nystatin Rash   Penicillins Rash   Pred Forte [Prednisolone] Rash   Prednisone Rash   Solu-Medrol [Methylprednisolone] Rash   Sulfa Antibiotics Rash   Tegaderm Ag Mesh [Silver] Rash   Toradol [Ketorolac Tromethamine] Rash   Tramadol Rash    Patient Measurements: Height: 5\' 3"  (160 cm) Weight: 99.7 kg (219 lb 14.4 oz) IBW/kg (Calculated) : 52.4 Heparin Dosing Weight: 76.5 kg  Vital Signs: Temp: 98.6 [name] (37 C) (12/21  2018) Temp Source: Oral (12/21 2018) BP: 127/91 (12/21 2022) Pulse Rate: 93 (12/21 2018)  Labs: Recent Labs    07/29/23 1420 07/29/23 1749 07/30/23 0741 07/30/23 1453 07/30/23 2103  HGB 13.6  --  12.6  --   --   HCT 41.6  --  39.4  --   --   PLT 337  --  304  --   --   HEPARINUNFRC  --   --  0.13* 0.30 0.25*  CREATININE 0.78  --  0.71  --   --   TROPONINIHS 3 3  --   --   --     Estimated Creatinine Clearance: 90.5 mL/min (by C-G formula based on SCr of 0.71 mg/dL).   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 Atopic dermatitis Formatting of this note might be different from the original. Formatting of this note might be different from the original. Atopic dermatitis Formatting of this note might be different from the original. Formatting of this note might be different from the original. Formatting of this note might be different from the or   Cataract 05/06/2015   Contusion of left wrist 07/08/2021   Crohn's disease (  HCC) 04/21/2015   De Quervain's tenosynovitis, left 07/31/2021   Diabetes mellitus without complication (HCC)    Diabetes type 2, controlled (HCC) 04/21/2015   Dry eyes 05/06/2015   DVT (deep venous thrombosis) (HCC)    Dysfunction of both eustachian tubes 08/31/2012   Encounter for monitoring immunomodulating therapy 11/20/2021   Environmental allergies 08/12/2020   Esophageal candidiasis (HCC) 06/26/2022   Fibrositis 08/28/2014   Formatting of this note might be different from the original. Fibromyalgia Formatting of this note might be different from the original. Formatting of this note might be different from the original. Fibromyalgia   Flank pain  11/24/2020   Gastroesophageal reflux disease with esophagitis without hemorrhage 08/01/2019   Formatting of this note might be different from the original. Formatting of this note might be different from the original. 07/2019: chronic symptoms of esophageal reflux since prior to sleeve gastrectomy surgery. Symptoms have worsened over the last 6 months with recent EGD findings of grade B esophagitis, irregular z line, erythematous mucosa, and evidence of sleeve gastrectomy. 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 Formatting of this note might be different from the original. 07/2019: 8 mm colonic polyp removed during colonoscopy, biopsy showed serrated polyp. Repeat colonoscopy recommended in one year. Formatting of this note might be different from the original. 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 Hypertension Formatting of this note might be different from the original. Formatting of this note might be different from the original. 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 Keratoconus Formatting of this note might be different from the original. Formatting of this note might be different from the original. Keratoconus Formatting of this note might be different from the original.  Formatting of this note might be different from the original. Keratoconus Formatting of this note might be different from the or   Microhematuria 06/27/2015   Migraine 08/28/2014   Formatting of this note might be different from the original. Migraine Formatting of this note might be different from the original. Formatting of this note might be different from the original. Migraine Formatting of this note might be different from the original. Migraine Formatting of this note might be different from the original. Migraine Formatting of this note might be different from the or   Migraine without aura and without status migrainosus, not intractable 08/28/2014   Formatting of this note might be different from the original. Formatting of this note might be different from the original. Formatting of this note might be different from the original. Migraine Formatting of this note might be different from the original. Migraine Formatting of this note might be different from the original. Formatting of this note might be different from the original. Migraine [name]   Mild intermittent asthma without complication 10/30/2008   Formatting of this note might  be different from the original. Formatting of this note might be different from the original. Formatting of this note might be different from the original. Asthma Formatting of this note might be different from the original. Asthma Formatting of this note might be different from the original. Formatting of this note might be different from the original. 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 Formatting of this note might be different from the original. 01/25/2016 with Dr. Dionisio David at [facility]. [place] of this note might be different from the original. 01/25/2016 with Dr. Dionisio David at [facility]. [place] of this note might be different from the original. Formatting of this note might be different from    Seronegative inflammatory arthritis 08/28/2014   Formatting of this note might be different from the original. Formatting of this note might be different from the original. Arthritis; diagnosed as IBD related - 2012 Formatting of this note might be different from the original. Followed by rheumatologist. Had to change providers due to an insurance change earlier this year resulting in patient not being able to have Remicade for ~6 months. 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 Formatting of this note might be different from the original. Treated with PRN miralax. Recent colonoscopy 07/2019. Formatting of this note might be different from the original. Treated with PRN miralax. Recent colonoscopy 07/2019. Formatting of this note might be different from the original. Formatting of this note might be different [name]   Status post placement of ureteral stent 02/06/2021   Tooth infection 08/12/2020   Ureteral stone 01/20/2022   Urinary tract infection, site not specified 06/26/2022    Medications:  Medications Prior to Admission  Medication Sig Dispense Refill Last Dose/Taking   acetaminophen (TYLENOL) 500 MG tablet Take 1,000 mg by mouth every 12 (twelve) hours as  needed for mild pain or moderate pain.   Past Week Morning   albuterol (PROVENTIL) (2.5 MG/3ML) 0.083% nebulizer solution Take 3 mLs (2.5 mg total) by nebulization every 6 (six) hours as needed for wheezing or shortness of breath. 150 mL 1 Past Month Morning   amLODipine (NORVASC) 5 MG tablet Take 1 tablet (5 mg total) by mouth daily. 30 tablet 2 07/29/2023 Morning   atorvastatin (LIPITOR) 80 MG tablet Take 1 tablet (80 mg total) by mouth daily. 30 tablet 2 07/29/2023 Morning   B Complex-C (B-COMPLEX WITH VITAMIN C) tablet Take 1 tablet by mouth daily.   07/29/2023 Morning   Cholecalciferol (VITAMIN D3) 50 MCG (2000 UT) TABS Take 1 tablet by mouth daily.   07/29/2023   EPINEPHrine (EPIPEN 2-PAK) 0.3 mg/0.3 mL IJ SOAJ injection Inject 0.3 mg into the muscle as needed for anaphylaxis. 2 each 2 Past Month   esomeprazole (NEXIUM) 40 MG capsule Take 1  capsule (40 mg total) by mouth 2 (two) times daily before a meal. (Patient taking differently: Take 80 mg by mouth daily.) 60 capsule 11 07/29/2023 Morning   famotidine (PEPCID) 40 MG tablet Take 1 tablet (40 mg total) by mouth daily. 30 tablet 11 07/29/2023 Morning   Golimumab (SIMPONI ARIA IV) Inject 200 mg into the vein every 8 (eight) weeks.   Past Month Morning   levocetirizine (XYZAL) 5 MG tablet Take 10 mg by mouth in the morning.   07/29/2023 Morning   linaclotide (LINZESS) 72 MCG capsule Take 144 mcg by mouth daily before breakfast.   07/29/2023 Morning   mirabegron ER (MYRBETRIQ) 50 MG TB24 tablet Take 1 tablet (50 mg total) by mouth daily. 30 tablet 11 07/29/2023 Morning   montelukast (SINGULAIR) 10 MG tablet Take 1 tablet (10 mg total) by mouth at bedtime. (Patient taking differently: Take 10 mg by mouth daily.) 30 tablet 11 07/29/2023 Morning   nitroGLYCERIN (NITROSTAT) 0.4 MG SL tablet Place 1 tablet (0.4 mg total) under the tongue every 5 (five) minutes as needed for chest pain. 25 tablet 6 07/29/2023 Morning   OXYGEN Inhale 2 L into the lungs  at bedtime.   07/28/2023 Bedtime   ticagrelor (BRILINTA) 90 MG TABS tablet Take 1 tablet (90 mg total) by mouth 2 (two) times daily. 60 tablet 5 07/29/2023 at 10:00 AM   ZOLMitriptan (ZOMIG) 2.5 MG tablet Take 1 tablet (2.5 mg total) by mouth once for 1 dose. May repeat in 2 hours if headache persists or recurs. (Patient taking differently: Take 2.5 mg by mouth daily as needed for migraine.) 10 tablet 1 Past Month   Infusions:   heparin 1,100 Units/hr (07/30/23 1409)   nitroGLYCERIN 15 mcg/min (07/30/23 1404)    Assessment: Casey [name] presents to the ED with mid-sternal chest pain that started on 12/19 pm. Pt took nitroglycerin SL x3 overnight and continued to have chest pain. Pt is s/p PCI with DES to LAD on 07/01/23 and was initiated on ticagrelor x6 months. Pt does not take any anticoagulation at home. Pharmacy consulted to dose heparin infusion per ACS protocol.   Confirmatory heparin level 0.25 is below goal on 1100 units/hr.  No issues noted.    Goal of Therapy:  Heparin level 0.3-0.7 units/ml Monitor platelets by anticoagulation protocol: Yes   Plan:  Increase heparin infusion to 1250 units/hr 6h heparin level with AM labs Daily heparin level, CBC  Continue to monitor H&H and platelets  Trixie Rude, PharmD Clinical Pharmacist 07/30/2023  9:46 PM

## 2023-07-30 NOTE — ED Notes (Signed)
ED TO INPATIENT HANDOFF REPORT  ED Nurse Name and Phone #:  Elmor Kost [phone]  S Name/Age/Gender Casey [name] 52 y.o. female Room/Bed: 037C/037C  Code Status   Code Status: Full Code  Home/SNF/Other Home Patient oriented to: self, place, time, and situation Is this baseline? Yes   Triage Complete: Triage complete  Chief Complaint Chest pain [R07.9] Unstable angina (HCC) [I20.0]  Triage Note Pt c/o mid-sternal chest pain that started last night. Pt had stent placed on 11/22. Pt states she took ntg x 3 overnight and continued to have pain and feeling like her heart was racing. Pt describes pain as mid-sternal radiating into back and upper shoulders. Pt has had nausea and shortness of breath, pt denies vomiting. Pt seen for similar complaints recently.    Allergies Allergies  Allergen Reactions   Albuterol Sulfate Shortness Of Breath    Other reaction(s): Other (See Comments)  can't breathe  Other reaction(s): Other (See Comments) can't breathe   Tape     Other reaction(s): Other (See Comments), Other (See Comments), Other (See Comments)  severe skin breakdown  Cast and Bandage Cover  severe skin breakdown  severe skin breakdown   Formoterol Other (See Comments)    Other reaction(s): Headaches  Other reaction(s): Headaches  Other reaction(s): Headaches  Headaches  Other reaction(s): Headaches Other reaction(s): Headaches    Other reaction(s): Headaches    Headaches   Hydrocodone Rash   Hydrocodone-Acetaminophen Rash   Nickel Rash and Other (See Comments)   Other Rash    cast and bandage use  cast and bandage use  cast and bandage use  cast and bandage use    cast and bandage use cast and bandage use   Oxycodone-Acetaminophen Other (See Comments)    headache   Salmeterol Rash   Strawberry Extract Hives    Full body rash from strawberry   Advair Hfa [Fluticasone-Salmeterol]     unknown   Proventil Hfa [Albuterol]     unknown   Asa [Aspirin] Rash    Cleocin [Clindamycin] Rash   Clindamycin/Lincomycin Rash   Fludrocortisone Acetate Rash   Gabapentin Rash   Hydrochlorothiazide Rash   Imipramine Rash   Ipratropium Bromide Rash   Medroxyprogesterone Rash   Meloxicam Rash   Metformin And Related Rash   Nystatin Rash   Penicillins Rash   Pred Forte [Prednisolone] Rash   Prednisone Rash   Solu-Medrol [Methylprednisolone] Rash   Sulfa Antibiotics Rash   Tegaderm Ag Mesh [Silver] Rash   Toradol [Ketorolac Tromethamine] Rash   Tramadol Rash    Level of Care/Admitting Diagnosis ED Disposition     ED Disposition  Admit   Condition  --   Comment  Hospital Area: [facility] [[id number]]  Level of Care: Progressive [102]  Admit to Progressive based on following criteria: CARDIOVASCULAR & THORACIC of moderate stability with acute coronary syndrome symptoms/low risk myocardial infarction/hypertensive urgency/arrhythmias/heart failure potentially compromising stability and stable post cardiovascular intervention patients.  May admit patient to [facility] or [facility] if equivalent level of care is available:: No  Covid Evaluation: Asymptomatic - no recent exposure (last 10 days) testing not required  Diagnosis: Unstable angina [facility]) [[id number]]  Admitting Physician: Charlsie Quest [[id number]]  Attending Physician: Charlsie Quest [[id number]]  Certification:: I certify this patient will need inpatient services for at least 2 midnights  Expected Medical Readiness: 08/02/2023          B Medical/Surgery History Past Medical History:  Diagnosis Date  Albuminuria 06/27/2015   Anxiety 11/04/2014   Arm DVT (deep venous thromboembolism), acute, left (HCC) 08/17/2022   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/30/2014   Asthma    Atopic dermatitis 10/30/2008   Formatting of this note might be different from the original. Atopic dermatitis Formatting of this note might be different from the original. Formatting  of this note might be different from the original. Atopic dermatitis Formatting of this note might be different from the original. Formatting of this note might be different from the original. Formatting of this note might be different from the or   Cataract 05/06/2015   Contusion of left wrist 07/08/2021   Crohn's disease (HCC) 04/21/2015   De Quervain's tenosynovitis, left 07/31/2021   Diabetes mellitus without complication (HCC)    Diabetes type 2, controlled (HCC) 04/21/2015   Dry eyes 05/06/2015   DVT (deep venous thrombosis) (HCC)    Dysfunction of both eustachian tubes 08/31/2012   Encounter for monitoring immunomodulating therapy 11/20/2021   Environmental allergies 08/12/2020   Esophageal candidiasis (HCC) 06/26/2022   Fibrositis 08/28/2014   Formatting of this note might be different from the original. Fibromyalgia Formatting of this note might be different from the original. Formatting of this note might be different from the original. Fibromyalgia   Flank pain 11/24/2020   Gastroesophageal reflux disease with esophagitis without hemorrhage 08/01/2019   Formatting of this note might be different from the original. Formatting of this note might be different from the original. 07/2019: chronic symptoms of esophageal reflux since prior to sleeve gastrectomy surgery. Symptoms have worsened over the last 6 months with recent EGD findings of grade B esophagitis, irregular z line, erythematous mucosa, and evidence of sleeve gastrectomy. 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 Formatting of this note might be different from the original. 07/2019: 8 mm colonic polyp removed during colonoscopy, biopsy showed serrated polyp. Repeat colonoscopy  recommended in one year. Formatting of this note might be different from the original. 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 Hypertension Formatting of this note might be different from the original. Formatting of this note might be different from the original. 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 Keratoconus Formatting of this note might be different from the original. Formatting of this note might be different from the original. Keratoconus Formatting of this note might be different from the original. Formatting of this note might be different from the original. Keratoconus Formatting of this note might be different from the or   Microhematuria 06/27/2015   Migraine 08/28/2014   Formatting of this note might be different from the original. Migraine Formatting of this note might be different from the original. Formatting of this note might be different from the original. Migraine Formatting of this note might be different from the  original. Migraine Formatting of this note might be different from the original. Migraine Formatting of this note might be different from the or   Migraine without aura and without status migrainosus, not intractable 08/28/2014   Formatting of this note might be different from the original. Formatting of this note might be different from the original. Formatting of this note might be different from the original. Migraine Formatting of this note might be different from the original. Migraine  Formatting of this note might be different from the original. Formatting of this note might be different from the original. Migraine F   Mild intermittent asthma without complication 10/30/2008   Formatting of this note might be different from the original. Formatting of this note might be different from the original. Formatting of this note might be different from the original. Asthma Formatting of this note might be different from the original. Asthma Formatting of this note might be different from the original. Formatting of this note might be different from the original. 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 Formatting of this note might be different from the original. 01/25/2016 with Dr. Dionisio David at [facility]. [place] of this note might be different from the original. 01/25/2016 with Dr. Dionisio David at [facility]. [place] of this note might be different from the original. Formatting of this note might be different from    Seronegative inflammatory arthritis 08/28/2014   Formatting of this note might be different from the original. Formatting of this note might be different from the original. Arthritis; diagnosed as IBD related - 2012 Formatting of this note might be different from the original. Followed by  rheumatologist. Had to change providers due to an insurance change earlier this year resulting in patient not being able to have Remicade for ~6 months. 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 Formatting of this note might be different from the original. Treated with PRN miralax. Recent colonoscopy 07/2019. Formatting of this note might be different from the original. Treated with PRN miralax. Recent colonoscopy 07/2019. Formatting of this note might be different from the original. Formatting of this note might be different f   Status post placement of ureteral stent 02/06/2021   Tooth infection 08/12/2020   Ureteral stone 01/20/2022   Urinary tract infection, site not specified 06/26/2022   Past Surgical History:  Procedure Laterality Date   BACK SURGERY     L5-S1   CERVICAL CONE BIOPSY  04/2000   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   CORONARY STENT INTERVENTION N/A 07/01/2023   Procedure: CORONARY STENT INTERVENTION;  Surgeon: Iran Ouch, MD;  Location: [facility] INVASIVE CV [facility];  Service: Cardiovascular;  Laterality: N/A;   EYE SURGERY     GASTRIC BYPASS  2017   [facility]  LAPAROSCOPIC GASTRIC SLEEVE RESECTION     LEFT HEART CATH AND CORONARY ANGIOGRAPHY N/A 07/01/2023   Procedure: LEFT HEART CATH AND CORONARY ANGIOGRAPHY;  Surgeon: Iran Ouch, MD;  Location: [facility] INVASIVE CV LAB;  Service: Cardiovascular;  Laterality: N/A;   Low back disc surgery     06/2000   TOTAL KNEE ARTHROPLASTY Right 11/2016     A IV Location/Drains/Wounds Patient Lines/Drains/Airways Status     Active Line/Drains/Airways     Name Placement date Placement time Site Days   Peripheral IV 07/29/23 20 G Left Antecubital 07/29/23  1830  Antecubital  1            Intake/Output Last 24 hours  Intake/Output Summary (Last 24 hours) at 07/30/2023 1424 Last data filed at 07/29/2023 2104 Gross  per 24 hour  Intake 54.17 ml  Output --  Net 54.17 ml    Labs/Imaging Results for orders placed or performed during the hospital encounter of 07/29/23 (from the past 48 hours)  Basic metabolic panel     Status: Abnormal   Collection Time: 07/29/23  2:20 PM  Result Value Ref Range   Sodium 139 135 - 145 mmol/L   Potassium 3.5 3.5 - 5.1 mmol/L   Chloride 103 98 - 111 mmol/L   CO2 25 22 - 32 mmol/L   Glucose, Bld 111 (H) 70 - 99 mg/dL    Comment: Glucose reference range applies only to samples taken after fasting for at least 8 hours.   BUN 9 6 - 20 mg/dL   Creatinine, Ser 8.46 0.44 - 1.00 mg/dL   Calcium 9.4 8.9 - 96.2 mg/dL   GFR, Estimated >95 >28 mL/min    Comment: (NOTE) Calculated using the CKD-EPI Creatinine Equation (2021)    Anion gap 11 5 - 15    Comment: Performed at [facility], [address] [place], [place] [address]  CBC     Status: Abnormal   Collection Time: 07/29/23  2:20 PM  Result Value Ref Range   WBC 10.1 4.0 - 10.5 K/uL   RBC 5.09 3.87 - 5.11 MIL/uL   Hemoglobin 13.6 12.0 - 15.0 g/dL   HCT 40.1 02.7 - 25.3 %   MCV 81.7 80.0 - 100.0 fL   MCH 26.7 26.0 - 34.0 pg   MCHC 32.7 30.0 - 36.0 g/dL   RDW 66.4 (H) 40.3 - 47.4 %   Platelets 337 150 - 400 K/uL   nRBC 0.0 0.0 - 0.2 %    Comment: Performed at [facility], [address], [place], [place] [address]  Troponin I (High Sensitivity)     Status: None   Collection Time: 07/29/23  2:20 PM  Result Value Ref Range   Troponin I (High Sensitivity) 3 <18 ng/L    Comment: (NOTE) Elevated high sensitivity troponin I (hsTnI) values and significant  changes across serial measurements may suggest ACS but many other  chronic and acute conditions are known to elevate hsTnI results.  Refer to the "Links" section for chest pain algorithms and additional  guidance. Performed at [facility], [address] [place], [place] [address]   Troponin I (High Sensitivity)     Status: None    Collection Time: 07/29/23  5:49 PM  Result Value Ref Range   Troponin I (High Sensitivity) 3 <18 ng/L    Comment: (NOTE) Elevated high sensitivity troponin I (hsTnI) values and significant  changes across serial measurements may suggest ACS but many other  chronic and acute conditions are known to elevate hsTnI results.  Refer to the "Links" section for chest pain algorithms and additional  guidance. Performed at [facility], [address] [place], [place] [address]   CBC     Status: Abnormal   Collection Time: 07/30/23  7:41 AM  Result Value Ref Range   WBC 10.8 (H) 4.0 - 10.5 K/uL   RBC 4.87 3.87 - 5.11 MIL/uL   Hemoglobin 12.6 12.0 - 15.0 g/dL   HCT 21.3 08.6 - 57.8 %   MCV 80.9 80.0 - 100.0 fL   MCH 25.9 (L) 26.0 - 34.0 pg   MCHC 32.0 30.0 - 36.0 g/dL   RDW 46.9 (H) 62.9 - 52.8 %   Platelets 304 150 - 400 K/uL   nRBC 0.0 0.0 - 0.2 %    Comment: Performed at [facility], [address], [place], [place] [address]  Basic metabolic panel     Status: Abnormal   Collection Time: 07/30/23  7:41 AM  Result Value Ref Range   Sodium 139 135 - 145 mmol/L   Potassium 3.4 (L) 3.5 - 5.1 mmol/L   Chloride 104 98 - 111 mmol/L   CO2 22 22 - 32 mmol/L   Glucose, Bld 117 (H) 70 - 99 mg/dL    Comment: Glucose reference range applies only to samples taken after fasting for at least 8 hours.   BUN 9 6 - 20 mg/dL   Creatinine, Ser 4.01 0.44 - 1.00 mg/dL   Calcium 9.2 8.9 - 02.7 mg/dL   GFR, Estimated >25 >36 mL/min    Comment: (NOTE) Calculated using the CKD-EPI Creatinine Equation (2021)    Anion gap 13 5 - 15    Comment: Performed at [facility], [address], [place], [place] [address]  Heparin level (unfractionated)     Status: Abnormal   Collection Time: 07/30/23  7:41 AM  Result Value Ref Range   Heparin Unfractionated 0.13 (L) 0.30 - 0.70 IU/mL    Comment: (NOTE) The clinical reportable range upper limit is being lowered to >1.10 to align with the FDA  approved guidance for the current laboratory assay.  If heparin results are below expected values, and patient dosage has  been confirmed, suggest follow up testing of antithrombin III levels. Performed at [facility], [address] [place], [place] [address]   Urinalysis, Routine w reflex microscopic -Urine, Clean Catch     Status: Abnormal   Collection Time: 07/30/23  8:55 AM  Result Value Ref Range   Color, Urine AMBER (A) YELLOW    Comment: BIOCHEMICALS MAY BE AFFECTED BY COLOR   APPearance CLOUDY (A) CLEAR   Specific Gravity, Urine 1.020 1.005 - 1.030   pH 5.0 5.0 - 8.0   Glucose, UA NEGATIVE NEGATIVE mg/dL   Hgb urine dipstick NEGATIVE NEGATIVE   Bilirubin Urine NEGATIVE NEGATIVE   Ketones, ur NEGATIVE NEGATIVE mg/dL   Protein, ur 30 (A) NEGATIVE mg/dL   Nitrite NEGATIVE NEGATIVE   Leukocytes,Ua NEGATIVE NEGATIVE   RBC / HPF 0-5 0 - 5 RBC/hpf   WBC, UA 0-5 0 - 5 WBC/hpf   Bacteria, UA RARE (A) NONE SEEN   Squamous Epithelial / HPF 11-20 0 - 5 /HPF   Mucus PRESENT    Ca Oxalate Crys, UA PRESENT     Comment: Performed at [facility], [address] [place], [place] [address]   DG Chest 2 View Result  Date: 07/29/2023 CLINICAL DATA:  Chest pain. EXAM: CHEST - 2 VIEW COMPARISON:  Chest radiograph dated June 29, 2023. FINDINGS: Low lung volume. The heart size and mediastinal contours are within normal limits. No focal consolidation, pleural effusion, or pneumothorax. No acute osseous abnormality. IMPRESSION: Low lung volumes.  No acute cardiopulmonary findings. Electronically Signed   By: Hart Robinsons M.D.   On: 07/29/2023 16:19    Pending Labs Unresulted Labs (From admission, onward)     Start     Ordered   07/31/23 0500  Heparin level (unfractionated)  Daily,   R      07/29/23 1924   07/31/23 0500  C-reactive protein  Daily,   R        07/30/23 1213   07/31/23 0500  Sedimentation rate  Daily,   R        07/30/23 1214   07/31/23 0500  Basic  metabolic panel  Tomorrow morning,   R        07/30/23 1238   07/31/23 0500  Magnesium  Tomorrow morning,   R        07/30/23 1238   07/30/23 1400  Heparin level (unfractionated)  Once-Timed,   TIMED        07/30/23 0823   07/30/23 0802  Urine Culture (for pregnant, neutropenic or urologic patients or patients with an indwelling urinary catheter)  (Urine Labs)  Once,   R       Question:  Indication  Answer:  Dysuria   07/30/23 0802   07/30/23 0500  CBC  Daily,   R      07/29/23 1924            Vitals/Pain Today's Vitals   07/30/23 1257 07/30/23 1300 07/30/23 1400 07/30/23 1403  BP: (!) 127/58 109/62 (!) 150/99 (!) 150/99  Pulse: 89   85  Resp: 17 18 (!) 22 20  Temp: 97.7 F (36.5 C)   98.5 F (36.9 C)  TempSrc: Oral   Oral  SpO2: 93%   95%  Weight:      Height:      PainSc:    5     Isolation Precautions No active isolations  Medications Medications  ondansetron (ZOFRAN) injection 4 mg (4 mg Intravenous Not Given 07/29/23 1854)  ticagrelor (BRILINTA) tablet 90 mg (90 mg Oral Given 07/30/23 0931)  nitroGLYCERIN 50 mg in dextrose 5 % 250 mL (0.2 mg/mL) infusion (15 mcg/min Intravenous Rate/Dose Change 07/30/23 1404)  heparin ADULT infusion 100 units/mL (25000 units/2105mL) (1,100 Units/hr Intravenous New Bag/Given 07/30/23 1409)  sodium chloride flush (NS) 0.9 % injection 3 mL (3 mLs Intravenous Not Given 07/30/23 1017)  acetaminophen (TYLENOL) tablet 650 mg (has no administration in time range)    Or  acetaminophen (TYLENOL) suppository 650 mg (has no administration in time range)  promethazine (PHENERGAN) tablet 12.5 mg (has no administration in time range)  polyethylene glycol (MIRALAX / GLYCOLAX) packet 17 g (has no administration in time range)  albuterol (PROVENTIL) (2.5 MG/3ML) 0.083% nebulizer solution 2.5 mg (has no administration in time range)  atorvastatin (LIPITOR) tablet 80 mg (80 mg Oral Given 07/30/23 0931)  pantoprazole (PROTONIX) EC tablet 80 mg (80  mg Oral Not Given 07/30/23 1247)  linaclotide (LINZESS) capsule 144 mcg (144 mcg Oral Not Given 07/30/23 0928)  montelukast (SINGULAIR) tablet 10 mg (10 mg Oral Given 07/30/23 0931)  morphine (PF) 2 MG/ML injection 1 mg (has no administration in time range)  fluconazole (DIFLUCAN) tablet 100 mg (  100 mg Oral Given 07/30/23 1243)  famotidine (PEPCID) tablet 40 mg (40 mg Oral Given 07/30/23 1243)  diltiazem (CARDIZEM CD) 24 hr capsule 120 mg (120 mg Oral Given 07/30/23 1243)  potassium chloride (KLOR-CON M) CR tablet 30 mEq (30 mEq Oral Patient Refused/Not Given 07/30/23 1407)  heparin bolus via infusion 4,000 Units (4,000 Units Intravenous Bolus from Bag 07/29/23 1930)  oxyCODONE (Oxy IR/ROXICODONE) immediate release tablet 5 mg (5 mg Oral Given 07/30/23 0406)  heparin bolus via infusion 2,000 Units (2,000 Units Intravenous Bolus from Bag 07/30/23 0930)    Mobility walks     Focused Assessments Cardiac Assessment Handoff:  Cardiac Rhythm: Normal sinus rhythm No results found for: "CKTOTAL", "CKMB", "CKMBINDEX", "TROPONINI" Lab Results  Component Value Date   DDIMER 0.44 06/29/2023   Does the Patient currently have chest pain? Yes   , Neuro Assessment Handoff:  Swallow screen pass? Yes  Cardiac Rhythm: Normal sinus rhythm       Neuro Assessment: Within Defined Limits Neuro Checks:      Has TPA been given? No If patient is a Neuro Trauma and patient is going to OR before floor call report to 4N Charge nurse: [phone] or [phone]      , Pulmonary Assessment Handoff:  Lung sounds:   O2 Device: Room Air      R Recommendations: See Admitting Provider Note  Report given to:   Additional Notes:  pt is allergic to pulse ox monitoring equipment, has her own pulse ox monitor. Prefers apple juice to take medications, does not like ice or water here

## 2023-07-31 ENCOUNTER — Inpatient Hospital Stay (HOSPITAL_COMMUNITY): Payer: 59

## 2023-07-31 DIAGNOSIS — I3139 Other pericardial effusion (noninflammatory): Secondary | ICD-10-CM | POA: Diagnosis not present

## 2023-07-31 DIAGNOSIS — I200 Unstable angina: Secondary | ICD-10-CM | POA: Diagnosis not present

## 2023-07-31 LAB — CBC
HCT: 37.5 % (ref 36.0–46.0)
Hemoglobin: 12.1 g/dL (ref 12.0–15.0)
MCH: 26.4 pg (ref 26.0–34.0)
MCHC: 32.3 g/dL (ref 30.0–36.0)
MCV: 81.7 fL (ref 80.0–100.0)
Platelets: 303 10*3/uL (ref 150–400)
RBC: 4.59 MIL/uL (ref 3.87–5.11)
RDW: 17.3 % — ABNORMAL HIGH (ref 11.5–15.5)
WBC: 11.8 10*3/uL — ABNORMAL HIGH (ref 4.0–10.5)
nRBC: 0 % (ref 0.0–0.2)

## 2023-07-31 LAB — HEPARIN LEVEL (UNFRACTIONATED)
Heparin Unfractionated: 0.38 [IU]/mL (ref 0.30–0.70)
Heparin Unfractionated: 0.4 [IU]/mL (ref 0.30–0.70)

## 2023-07-31 LAB — BASIC METABOLIC PANEL
Anion gap: 10 (ref 5–15)
BUN: 10 mg/dL (ref 6–20)
CO2: 23 mmol/L (ref 22–32)
Calcium: 9.1 mg/dL (ref 8.9–10.3)
Chloride: 107 mmol/L (ref 98–111)
Creatinine, Ser: 0.72 mg/dL (ref 0.44–1.00)
GFR, Estimated: >60 mL/min (ref >60)
Glucose, Bld: 120 mg/dL — ABNORMAL HIGH (ref 70–99)
Potassium: 3.6 mmol/L (ref 3.5–5.1)
Sodium: 140 mmol/L (ref 135–145)

## 2023-07-31 LAB — SEDIMENTATION RATE: Sed Rate: 40 mm/h — ABNORMAL HIGH (ref 0–22)

## 2023-07-31 LAB — MAGNESIUM: Magnesium: 1.9 mg/dL (ref 1.7–2.4)

## 2023-07-31 LAB — ECHOCARDIOGRAM LIMITED
Height: 63 in
Weight: 3518.4 [oz_av]

## 2023-07-31 LAB — C-REACTIVE PROTEIN: CRP: 0.8 mg/dL (ref <1.0)

## 2023-07-31 MED ORDER — ASPIRIN 81 MG PO CHEW: 81.0000 mg | CHEWABLE_TABLET | ORAL | Status: DC

## 2023-07-31 MED ORDER — POTASSIUM CHLORIDE CRYS ER 20 MEQ PO TBCR
40.0000 meq | EXTENDED_RELEASE_TABLET | Freq: Once | ORAL | Status: DC
  Filled 2023-07-31: qty 2

## 2023-07-31 MED ORDER — POTASSIUM CHLORIDE 10 MEQ/100ML IV SOLN
10.0000 meq | INTRAVENOUS | Status: AC
  Administered 2023-07-31 (×2): 10 meq via INTRAVENOUS
  Filled 2023-07-31 (×2): qty 100

## 2023-07-31 MED ORDER — OXYCODONE HCL 5 MG PO TABS
5.0000 mg | ORAL_TABLET | Freq: Once | ORAL | Status: AC
  Administered 2023-07-31: 5 mg via ORAL
  Filled 2023-07-31: qty 1

## 2023-07-31 MED ORDER — KCL IN DEXTROSE-NACL 20-5-0.9 MEQ/L-%-% IV SOLN
INTRAVENOUS | Status: AC
Start: 2023-07-31 — End: 2023-08-01
  Filled 2023-07-31 (×2): qty 1000

## 2023-07-31 MED ORDER — OXYCODONE HCL 5 MG PO TABS: 5.0000 mg | ORAL_TABLET | Freq: Four times a day (QID) | ORAL | Status: DC | PRN

## 2023-07-31 MED ORDER — POTASSIUM CHLORIDE 10 MEQ/100ML IV SOLN
10.0000 meq | INTRAVENOUS | Status: AC
Start: 2023-07-31 — End: 2023-07-31
  Administered 2023-07-31: 10 meq via INTRAVENOUS
  Filled 2023-07-31: qty 100

## 2023-07-31 MED ORDER — SODIUM CHLORIDE 0.9 % IV SOLN: INTRAVENOUS | Status: DC

## 2023-07-31 MED ORDER — CEFTRIAXONE SODIUM 1 G IJ SOLR
1.0000 g | INTRAMUSCULAR | Status: DC
  Administered 2023-07-31 – 2023-08-01 (×2): 1 g via INTRAVENOUS
  Filled 2023-07-31 (×2): qty 10

## 2023-07-31 NOTE — Progress Notes (Signed)
*  PRELIMINARY RESULTS* Echocardiogram Limited 2-D Echocardiogram  has been performed.  Casey [name] 07/31/2023, 3:24 PM

## 2023-07-31 NOTE — Progress Notes (Signed)
ANTICOAGULATION CONSULT NOTE  Pharmacy Consult for Heparin Indication: chest pain/ACS  Allergies  Allergen Reactions   Albuterol Sulfate Shortness Of Breath    Other reaction(s): Other (See Comments)  can't breathe  Other reaction(s): Other (See Comments) can't breathe   Tape     Other reaction(s): Other (See Comments), Other (See Comments), Other (See Comments)  severe skin breakdown  Cast and Bandage Cover  severe skin breakdown  severe skin breakdown   Formoterol Other (See Comments)    Other reaction(s): Headaches  Other reaction(s): Headaches  Other reaction(s): Headaches  Headaches  Other reaction(s): Headaches Other reaction(s): Headaches    Other reaction(s): Headaches    Headaches   Hydrocodone Rash   Hydrocodone-Acetaminophen Rash   Nickel Rash and Other (See Comments)   Other Rash    cast and bandage use  cast and bandage use  cast and bandage use  cast and bandage use    cast and bandage use cast and bandage use   Oxycodone-Acetaminophen Other (See Comments)    headache   Salmeterol Rash   Strawberry Extract Hives    Full body rash from strawberry   Advair Hfa [Fluticasone-Salmeterol]     unknown   Proventil Hfa [Albuterol]     unknown   Asa [Aspirin] Rash   Cleocin [Clindamycin] Rash   Clindamycin/Lincomycin Rash   Fludrocortisone Acetate Rash   Gabapentin Rash   Hydrochlorothiazide Rash   Imipramine Rash   Ipratropium Bromide Rash   Medroxyprogesterone Rash   Meloxicam Rash   Metformin And Related Rash   Nystatin Rash   Penicillins Rash   Pred Forte [Prednisolone] Rash   Prednisone Rash   Solu-Medrol [Methylprednisolone] Rash   Sulfa Antibiotics Rash   Tegaderm Ag Mesh [Silver] Rash   Toradol [Ketorolac Tromethamine] Rash   Tramadol Rash    Patient Measurements: Height: 5\' 3"  (160 cm) Weight: 99.7 kg (219 lb 14.4 oz) IBW/kg (Calculated) : 52.4 Heparin Dosing Weight: 76.5 kg  Vital Signs: Temp: 97.7 F (36.5 C) (12/22  0424) Temp Source: Oral (12/22 0424) BP: 139/94 (12/22 0525) Pulse Rate: 74 (12/22 0525)  Labs: Recent Labs    07/29/23 1420 07/29/23 1420 07/29/23 1749 07/30/23 0741 07/30/23 1453 07/30/23 2103 07/31/23 0402  HGB 13.6  --   --  12.6  --   --  12.1  HCT 41.6  --   --  39.4  --   --  37.5  PLT 337  --   --  304  --   --  303  HEPARINUNFRC  --    < >  --  0.13* 0.30 0.25* 0.38  CREATININE 0.78  --   --  0.71  --   --  0.72  TROPONINIHS 3  --  3  --   --   --   --    < > = values in this interval not displayed.    Estimated Creatinine Clearance: 90.5 mL/min (by C-G formula based on SCr of 0.72 mg/dL).   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 Atopic dermatitis Formatting of this note might be different from the original. Formatting of this note might be different from the original. Atopic dermatitis Formatting of this  note might be different from the original. Formatting of this note might be different from the original. Formatting of this note might be different from the or   Cataract 05/06/2015   Contusion of left wrist 07/08/2021   Crohn's disease (HCC) 04/21/2015   De Quervain's tenosynovitis, left 07/31/2021   Diabetes mellitus without complication (HCC)    Diabetes type 2, controlled (HCC) 04/21/2015   Dry eyes 05/06/2015   DVT (deep venous thrombosis) (HCC)    Dysfunction of both eustachian tubes 08/31/2012   Encounter for monitoring immunomodulating therapy 11/20/2021   Environmental allergies 08/12/2020   Esophageal candidiasis (HCC) 06/26/2022   Fibrositis 08/28/2014   Formatting of this note might be different from the original. Fibromyalgia Formatting of this  note might be different from the original. Formatting of this note might be different from the original. Fibromyalgia   Flank pain 11/24/2020   Gastroesophageal reflux disease with esophagitis without hemorrhage 08/01/2019   Formatting of this note might be different from the original. Formatting of this note might be different from the original. 07/2019: chronic symptoms of esophageal reflux since prior to sleeve gastrectomy surgery. Symptoms have worsened over the last 6 months with recent EGD findings of grade B esophagitis, irregular z line, erythematous mucosa, and evidence of sleeve gastrectomy. 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 Formatting of this note might be different from the original. 07/2019: 8 mm colonic polyp removed during colonoscopy, biopsy showed serrated polyp. Repeat colonoscopy recommended in one year. Formatting of this note might be different from the original. 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 Hypertension Formatting of this note might be different from the original. Formatting of this note might be different from the original. 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 Keratoconus Formatting of this note might be different from the original.  Formatting of this note might be different from the original. Keratoconus Formatting of this note might be different from the original. Formatting of this note might be different from the original. Keratoconus Formatting of this note might be different from the or   Microhematuria 06/27/2015   Migraine 08/28/2014   Formatting of this note might be different from the original. Migraine Formatting of this note might be different from the original. Formatting of this note might be different from the original. Migraine Formatting of this note might be different from the original. Migraine Formatting of this note might be different from the original. Migraine Formatting of this note might be different from the or   Migraine without aura and without status migrainosus, not intractable 08/28/2014   Formatting of this note might be different from the original. Formatting of this note might be different from the original. Formatting of this note might be different from the original. Migraine Formatting of this note might be  different from the original. Migraine Formatting of this note might be different from the original. Formatting of this note might be different from the original. Migraine F   Mild intermittent asthma without complication 10/30/2008   Formatting of this note might be different from the original. Formatting of this note might be different from the original. Formatting of this note might be different from the original. Asthma Formatting of this note might be different from the original. Asthma Formatting of this note might be different from the original. Formatting of this note might be different from the original. 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 Formatting of this note might be different from the original. 01/25/2016 with Dr. Dionisio David at [facility]. [place] of this note might be different from the original. 01/25/2016 with Dr. Dionisio David at [facility]. [place] of this note might be different from the original. Formatting of this note might be different from    Seronegative inflammatory arthritis 08/28/2014   Formatting of this note might be different from the original. Formatting of this note might be different from the original. Arthritis; diagnosed as IBD related - 2012 Formatting of this note might be different from the original. Followed by rheumatologist. Had to change providers due to an insurance change earlier this year resulting in patient not being able to have Remicade for ~6 months. 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 Formatting of this note might be different from the original. Treated with PRN miralax. Recent colonoscopy 07/2019. Formatting of this note might be different from the original. Treated with PRN miralax. Recent colonoscopy 07/2019. Formatting of this note might be different from the original. Formatting of this note might be different f   Status post placement of ureteral stent 02/06/2021   Tooth infection 08/12/2020   Ureteral stone 01/20/2022   Urinary tract infection, site not specified 06/26/2022    Medications:  Medications Prior to Admission  Medication  Sig Dispense Refill Last Dose/Taking   acetaminophen (TYLENOL) 500 MG tablet Take 1,000 mg by mouth every 12 (twelve) hours as needed for mild pain or moderate pain.   Past Week Morning   albuterol (PROVENTIL) (2.5 MG/3ML) 0.083% nebulizer solution Take 3 mLs (2.5 mg total) by nebulization every 6 (six) hours as needed for wheezing or shortness of breath. 150 mL 1 Past Month Morning   amLODipine (NORVASC) 5 MG tablet Take 1 tablet (5 mg total) by mouth daily. 30 tablet 2 07/29/2023 Morning   atorvastatin (LIPITOR) 80 MG tablet Take 1 tablet (80 mg total) by mouth daily. 30 tablet 2 07/29/2023 Morning   B Complex-C (B-COMPLEX WITH VITAMIN C) tablet Take 1 tablet by mouth daily.   07/29/2023 Morning   Cholecalciferol (VITAMIN D3) 50 MCG (2000 UT) TABS Take 1  tablet by mouth daily.   07/29/2023   EPINEPHrine (EPIPEN 2-PAK) 0.3 mg/0.3 mL IJ SOAJ injection Inject 0.3 mg into the muscle as needed for anaphylaxis. 2 each 2 Past Month   esomeprazole (NEXIUM) 40 MG capsule Take 1 capsule (40 mg total) by mouth 2 (two) times daily before a meal. (Patient taking differently: Take 80 mg by mouth daily.) 60 capsule 11 07/29/2023 Morning   famotidine (PEPCID) 40 MG tablet Take 1 tablet (40 mg total) by mouth daily. 30 tablet 11 07/29/2023 Morning   Golimumab (SIMPONI ARIA IV) Inject 200 mg into the vein every 8 (eight) weeks.   Past Month Morning   levocetirizine (XYZAL) 5 MG tablet Take 10 mg by mouth in the morning.   07/29/2023 Morning   linaclotide (LINZESS) 72 MCG capsule Take 144 mcg by mouth daily before breakfast.   07/29/2023 Morning   mirabegron ER (MYRBETRIQ) 50 MG TB24 tablet Take 1 tablet (50 mg total) by mouth daily. 30 tablet 11 07/29/2023 Morning   montelukast (SINGULAIR) 10 MG tablet Take 1 tablet (10 mg total) by mouth at bedtime. (Patient taking differently: Take 10 mg by mouth daily.) 30 tablet 11 07/29/2023 Morning   nitroGLYCERIN (NITROSTAT) 0.4 MG SL tablet Place 1 tablet (0.4 mg total) under  the tongue every 5 (five) minutes as needed for chest pain. 25 tablet 6 07/29/2023 Morning   OXYGEN Inhale 2 L into the lungs at bedtime.   07/28/2023 Bedtime   ticagrelor (BRILINTA) 90 MG TABS tablet Take 1 tablet (90 mg total) by mouth 2 (two) times daily. 60 tablet 5 07/29/2023 at 10:00 AM   ZOLMitriptan (ZOMIG) 2.5 MG tablet Take 1 tablet (2.5 mg total) by mouth once for 1 dose. May repeat in 2 hours if headache persists or recurs. (Patient taking differently: Take 2.5 mg by mouth daily as needed for migraine.) 10 tablet 1 Past Month   Infusions:   heparin 1,250 Units/hr (07/30/23 2230)   nitroGLYCERIN 15 mcg/min (07/31/23 0413)    Assessment: 52 yo F presents to the ED with mid-sternal chest pain that started on 12/19 pm. Pt took nitroglycerin SL x3 overnight and continued to have chest pain. Pt is s/p PCI with DES to LAD on 07/01/23 and was initiated on ticagrelor x6 months. Pt does not take any anticoagulation at home. Pharmacy consulted to dose heparin infusion per ACS protocol.   Heparin level therapeutic at 0.38 on heparin 1250 units/hr. Hgb/plt wnl. No signs of bleeding or issues with infusion noted.   Goal of Therapy:  Heparin level 0.3-0.7 units/ml Monitor platelets by anticoagulation protocol: Yes   Plan:  Continue heparin infusion at 1250 units/hr 6 hour confirmatory heparin level  Daily heparin level, CBC  Continue to monitor H&H and platelets and for s/sx of bleeding  Thank you for involving pharmacy in the patient's care.   Theotis Burrow, PharmD PGY1 Acute Care Pharmacy Resident  07/31/2023 7:26 AM

## 2023-07-31 NOTE — Progress Notes (Addendum)
PROGRESS NOTE    Casey [name]  UXL:[id number] DOB: 07-11-1971 DOA: 07/29/2023 PCP: Dominic Pea, PA-C    Brief Narrative:   Casey [name] is a 52 y.o. female with past medical history significant for CAD s/p DES to LAD 07/01/2023, moderate persistent asthma, HTN, HLD, RA on Simponi, complex hiatal hernia s/p gastric sleeve, Hx of LUE DVT no longer on anticoagulation who presented to the ED for evaluation of chest pain. Patient states that she developed recurrent midsternal chest discomfort last night around 9:30 PM while she was at rest.  This was similar to her previous chest pain.  She took sublingual nitroglycerin with some transient relief.  Today she had continued and worsening pain with minimal exertion therefore she came to the ED for further evaluation.  She has had some nausea but no emesis.  Has had some chills but no diaphoresis or fevers.  She has had some shortness of breath during severe chest pain.  She denies any swelling to her lower extremities.   Patient recently admitted 06/29/2023-07/02/2023 for chest pain.  She underwent cardiac cath 11/22 which noted proximal LAD to mid LAD lesion with 85% stenosis.  This was treated with deployment of a DES.  Previously she was on Plavix which was switched to 6 months of Brilinta with plan to transition back to Plavix alone due to reported allergy to aspirin.  EF 55-65% on cardiac cath.  Patient was chest pain-free on discharge.   She reports a history of severe rash related to aspirin use when she was 52 years old.   In the ED, BP 124/99, pulse 60, RR 18, temp 98.3 F, SpO2 100% on room air.   Labs show WBC 10.1, hemoglobin 13.6, platelets 337,000, sodium 139, potassium 3.5, bicarb 25, BUN 9, creatinine 0.78, serum glucose 111, troponin 3 x 2. Chest x-ray showed low lung volumes without focal consolidation, edema, or effusion.  No pneumothorax. Cardiology consulted and recommended medical admission and they will evaluate.  The  hospitalist service was consulted to admit for concerns of unstable angina.  Assessment & Plan:   Chest pain/unstable angina CAD s/p DES to LAD 07/01/2023: Patient with recurrent unstable anginal symptoms.  Not on aspirin due to history of allergic reaction.  Has been on Brilinta since recent stenting.  Troponin is negative x 2.  EKG without significant ischemic changes. -- Cardiology following, appreciate assistance -- Limited Echo: pending -- CRP 0.8 and ESR 40 -- Heparin drip, pharmacy consulted for dosing/monitoring -- IV nitroglycerin drip per cardiology -- Started on diltiazem 120 mg p.o. daily on 12/21 -- Continue Brilinta and atorvastatin -- Cardiology anticipating heart catheterization on Monday 12/23; NPO after midnight -- Monitor on telemetry  GNR urinary tract infection Patient reports incomplete treatment for outpatient UTI, states was on Diflucan. -- Restart Diflucan 100 mg p.o. daily on 12/21 -- Urine Culture w/ 20K GNRs, await further identification/susceptibilities -- start ceftriaxone 1 g IV every 24 hours, plan 3-day course  Hypokalemia Potassium 3.6, will replete. -- Repeat electrolytes in a.m. to, magnesium   Moderate persistent asthma: Stable without wheezing.   -- Continue Singulair 10 mg p.o. daily -- albuterol nebulizer as needed.   Hypertension: Holding amlodipine while on nitro drip.  BP is currently stable. --Cardiology started diltiazem CD120 mg p.o. daily on 12/21   Hyperlipidemia: -- Continue atorvastatin 80 mg p.o. daily   Rheumatoid arthritis: On Simponi every 8 weeks per [facility] rheumatology.   GERD: --Protonix 80 mg p.o. daily (substituted for  home Nexium) -- Famotidine 40 mg p.o. daily  Morbid obesity, class II Body mass index is 38.95 kg/m.  Complicates all facets of care   DVT prophylaxis: Heparin drip    Code Status: Full Code Family Communication: No family present at bedside this morning  Disposition Plan:  Level of care:  Progressive Status is: Inpatient Remains inpatient appropriate because: IV nitroglycerin/heparin drip, pending left heart catheterization 12/23    Consultants:  Cardiology  Procedures:  None  Antimicrobials:  Fluconazole 12/21>> Ceftriaxone 12/22>>   Subjective: Patient seen examined bedside, lying in bed.  RN present at bedside.  Continues to report improved anterior chest pain, just "slight" right now.  Remains on IV heparin/nitroglycerin drip.  Seen by cardiology with plan for left heart catheterization tomorrow.  Still pending limited TTE for concerns of possible pericarditis.   No other specific complaints, concerns or questions at this time.  Denies visual changes, no dizziness, no shortness of breath, no abdominal pain, no cough/congestion, no fever/chills/night sweats, no nausea/vomiting/diarrhea, no focal weakness, no fatigue, no paresthesia.  No acute events overnight per nurse staff.  Objective: Vitals:   07/31/23 0217 07/31/23 0424 07/31/23 0525 07/31/23 0746  BP: 103/68  (!) 139/94 (!) 130/91  Pulse:  72 74 78  Resp:  14 18 18   Temp:  97.7 F (36.5 C)  (!) 97.4 F (36.3 C)  TempSrc:  Oral  Oral  SpO2:  95%  94%  Weight:      Height:        Intake/Output Summary (Last 24 hours) at 07/31/2023 1218 Last data filed at 07/31/2023 1158 Gross per 24 hour  Intake 840 ml  Output --  Net 840 ml   Filed Weights   07/29/23 1330 07/30/23 1800  Weight: 102.1 kg 99.7 kg    Examination:  Physical Exam: GEN: NAD, alert and oriented x 3, obese HEENT: NCAT, PERRL, EOMI, sclera clear, MMM PULM: CTAB w/o wheezes/crackles, normal respiratory effort, on room air CV: RRR w/o M/G/R GI: abd soft, NTND, NABS, no R/G/M MSK: no peripheral edema, muscle strength globally intact 5/5 bilateral upper/lower extremities NEURO: CN II-XII intact, no focal deficits, sensation to light touch intact PSYCH: normal mood/affect Integumentary: dry/intact, no rashes or wounds    Data  Reviewed: I have personally reviewed following labs and imaging studies  CBC: Recent Labs  Lab 07/29/23 1420 07/30/23 0741 07/31/23 0402  WBC 10.1 10.8* 11.8*  HGB 13.6 12.6 12.1  HCT 41.6 39.4 37.5  MCV 81.7 80.9 81.7  PLT 337 304 303   Basic Metabolic Panel: Recent Labs  Lab 07/29/23 1420 07/30/23 0741 07/31/23 0402  NA 139 139 140  K 3.5 3.4* 3.6  CL 103 104 107  CO2 25 22 23   GLUCOSE 111* 117* 120*  BUN 9 9 10   CREATININE 0.78 0.71 0.72  CALCIUM 9.4 9.2 9.1  MG  --   --  1.9   GFR: Estimated Creatinine Clearance: 90.5 mL/min (by C-G formula based on SCr of 0.72 mg/dL). Liver Function Tests: No results for input(s): "AST", "ALT", "ALKPHOS", "BILITOT", "PROT", "ALBUMIN" in the last 168 hours. No results for input(s): "LIPASE", "AMYLASE" in the last 168 hours. No results for input(s): "AMMONIA" in the last 168 hours. Coagulation Profile: No results for input(s): "INR", "PROTIME" in the last 168 hours. Cardiac Enzymes: No results for input(s): "CKTOTAL", "CKMB", "CKMBINDEX", "TROPONINI" in the last 168 hours. BNP (last 3 results) No results for input(s): "PROBNP" in the last 8760 hours. HbA1C: No results  for input(s): "HGBA1C" in the last 72 hours. CBG: No results for input(s): "GLUCAP" in the last 168 hours. Lipid Profile: No results for input(s): "CHOL", "HDL", "LDLCALC", "TRIG", "CHOLHDL", "LDLDIRECT" in the last 72 hours. Thyroid Function Tests: No results for input(s): "TSH", "T4TOTAL", "FREET4", "T3FREE", "THYROIDAB" in the last 72 hours. Anemia Panel: No results for input(s): "VITAMINB12", "FOLATE", "FERRITIN", "TIBC", "IRON", "RETICCTPCT" in the last 72 hours. Sepsis Labs: No results for input(s): "PROCALCITON", "LATICACIDVEN" in the last 168 hours.  Recent Results (from the past 240 hours)  Urine Culture (for pregnant, neutropenic or urologic patients or patients with an indwelling urinary catheter)     Status: Abnormal (Preliminary result)    Collection Time: 07/30/23  8:02 AM   Specimen: Urine, Clean Catch  Result Value Ref Range Status   Specimen Description URINE, CLEAN CATCH  Final   Special Requests NONE  Final   Culture (A)  Final    20,000 COLONIES/mL GRAM NEGATIVE RODS SUSCEPTIBILITIES TO FOLLOW Performed at [facility], [address] [place], [place] [address]    Report Status PENDING  Incomplete         Radiology Studies: DG Chest 2 View Result Date: 07/29/2023 CLINICAL DATA:  Chest pain. EXAM: CHEST - 2 VIEW COMPARISON:  Chest radiograph dated June 29, 2023. FINDINGS: Low lung volume. The heart size and mediastinal contours are within normal limits. No focal consolidation, pleural effusion, or pneumothorax. No acute osseous abnormality. IMPRESSION: Low lung volumes.  No acute cardiopulmonary findings. Electronically Signed   By: Hart Robinsons M.D.   On: 07/29/2023 16:19        Scheduled Meds:  atorvastatin  80 mg Oral Daily   famotidine  40 mg Oral Daily   fluconazole  100 mg Oral Daily   linaclotide  144 mcg Oral QAC breakfast   montelukast  10 mg Oral Daily   ondansetron (ZOFRAN) IV  4 mg Intravenous Once   pantoprazole  80 mg Oral Q1200   sodium chloride flush  3 mL Intravenous Q12H   ticagrelor  90 mg Oral BID   Continuous Infusions:  heparin 1,250 Units/hr (07/30/23 2230)   nitroGLYCERIN 15 mcg/min (07/31/23 0413)   potassium chloride 10 mEq (07/31/23 1034)     LOS: 2 days    Time spent: 52 minutes spent on chart review, discussion with nursing staff, consultants, updating family and interview/physical exam; more than 50% of that time was spent in counseling and/or coordination of care.    Alvira Philips [place], DO [facility] Available via Epic secure chat 7am-7pm After these hours, please refer to coverage provider listed on [email or web address] 07/31/2023, 12:18 PM

## 2023-07-31 NOTE — Progress Notes (Signed)
Progress Note  Patient Name: Casey [name] Date of Encounter: 07/31/2023 Primary Cardiologist: Gypsy Balsam, MD   Subjective   Sed rate elevated CRP normal Patient notes pain and tachycardia with diltiazem   Vital Signs    Vitals:   07/31/23 0117 07/31/23 0217 07/31/23 0424 07/31/23 0525  BP: 108/65 103/68  (!) 139/94  Pulse:   72 74  Resp:   14 18  Temp:   97.7 F (36.5 C)   TempSrc:   Oral   SpO2:   95%   Weight:      Height:        Intake/Output Summary (Last 24 hours) at 07/31/2023 0711 Last data filed at 07/31/2023 0100 Gross per 24 hour  Intake 360 ml  Output --  Net 360 ml   Filed Weights   07/29/23 1330 07/30/23 1800  Weight: 102.1 kg 99.7 kg    Physical Exam   GEN: No acute distress.  Morbid obesity Neck: No JVD Cardiac: RRR, no murmurs, rubs, or gallops. R radial +2 Respiratory: Clear to auscultation bilaterally. GI: Soft, nontender, non-distended  MS: No edema  Labs   Telemetry: SR    Chemistry Recent Labs  Lab 07/29/23 1420 07/30/23 0741 07/31/23 0402  NA 139 139 140  K 3.5 3.4* 3.6  CL 103 104 107  CO2 25 22 23   GLUCOSE 111* 117* 120*  BUN 9 9 10   CREATININE 0.78 0.71 0.72  CALCIUM 9.4 9.2 9.1  GFRNONAA >60 >60 >60  ANIONGAP 11 13 10      Hematology Recent Labs  Lab 07/29/23 1420 07/30/23 0741 07/31/23 0402  WBC 10.1 10.8* 11.8*  RBC 5.09 4.87 4.59  HGB 13.6 12.6 12.1  HCT 41.6 39.4 37.5  MCV 81.7 80.9 81.7  MCH 26.7 25.9* 26.4  MCHC 32.7 32.0 32.3  RDW 17.1* 17.3* 17.3*  PLT 337 304 303    Cardiac EnzymesNo results for input(s): "TROPONINI" in the last 168 hours. No results for input(s): "TROPIPOC" in the last 168 hours.   BNPNo results for input(s): "BNP", "PROBNP" in the last 168 hours.   DDimer No results for input(s): "DDIMER" in the last 168 hours.   Cardiac Studies   Cardiac Studies & Procedures   CARDIAC CATHETERIZATION  CARDIAC CATHETERIZATION 07/01/2023  Narrative   Prox LAD to Mid LAD  lesion is 85% stenosed.   A drug-eluting stent was successfully placed using a SYNERGY XD 3.0X20.   Post intervention, there is a 0% residual stenosis.   The left ventricular systolic function is normal.   LV end diastolic pressure is mildly elevated.   The left ventricular ejection fraction is 55-65% by visual estimate.  1.  Severe one-vessel coronary artery disease with 85% stenosis in the proximal/mid LAD shortly after the origin of a large first diagonal.  The area is hazy and highly suggestive of plaque rupture. 2.  Normal LV systolic function and mildly elevated left ventricular end-diastolic pressure at 13 mmHg. 3.  Successful drug-eluting stent placement to the proximal/mid LAD.  Recommendations: The patient is allergic to aspirin.  Thus, we will switch from clopidogrel to Brilinta which should be continued for a minimum of 6 months.  After 6 months, she can be switched back to clopidogrel. Recommend aggressive treatment of risk factors.  Findings Coronary Findings Diagnostic  Dominance: Right  Left Main Vessel is angiographically normal.  Left Anterior Descending Prox LAD to Mid LAD lesion is 85% stenosed. Vessel is the culprit lesion. The lesion is type  C and distal to major branch. The lesion is mildly calcified. The lesion was not previously treated .  Second Diagonal Branch Vessel is small in size. Vessel is angiographically normal.  Third Diagonal Branch Vessel is small in size. Vessel is angiographically normal.  Left Circumflex The vessel exhibits minimal luminal irregularities.  First Obtuse Marginal Branch Vessel is small in size.  Second Obtuse Marginal Branch Vessel is large in size. Vessel is angiographically normal.  Right Coronary Artery The vessel exhibits minimal luminal irregularities.  Right Posterior Descending Artery Vessel is angiographically normal.  Right Posterior Atrioventricular Artery Vessel is angiographically  normal.  Intervention  Prox LAD to Mid LAD lesion Stent Lesion length:  20 mm. CATH LAUNCHER 6FR EBU3.5 guide catheter was inserted. Unable to cross lesion with guidewire using a WIRE RUNTHROUGH .[id number]. Pre-stent angioplasty was not performed. A drug-eluting stent was successfully placed using a SYNERGY XD 3.0X20. Maximum pressure: 14 atm. Inflation time: 20 sec. Post-stent angioplasty was performed using a BALL SAPPHIRE NC24 3.25X12. Maximum pressure:  16 atm. Inflation time:  20 sec. Post-Intervention Lesion Assessment The intervention was successful. Pre-interventional TIMI flow is 3. Post-intervention TIMI flow is 3. No complications occurred at this lesion. There is a 0% residual stenosis post intervention.    ECHOCARDIOGRAM  ECHOCARDIOGRAM COMPLETE 06/24/2023  Narrative ECHOCARDIOGRAM REPORT    Patient Name:   Casey [name] Date of Exam: 06/24/2023 Medical Rec #:  [id number]    Height:       63.0 in Accession #:    [id number]   Weight:       224.6 lb Date of Birth:  October 05, 1970    BSA:          2.031 m Patient Age:    52 years     BP:           118/82 mmHg Patient Gender: F            HR:           65 bpm. [facility]  Procedure: 2D Echo, Cardiac Doppler and Color Doppler  Indications:    R06.9 DOE  History:        Patient has prior history of Echocardiogram examinations, most recent 10/14/2022. CAD, Chron's disease, Signs/Symptoms:Chest Pain, Dizziness/Lightheadedness and Dyspnea; Risk Factors:Morbid obesity, Hypertension, Non-Smoker, Diabetes and Dyslipidemia. UE-DVT.  Sonographer:    Dominica Severin RCS, RVS Referring Phys: [phone] Marveen Reeks KRASOWSKI   Sonographer Comments: Global longitudinal strain was attempted. IMPRESSIONS   1. Left ventricular ejection fraction, by estimation, is 60 to 65%. The left ventricle has normal function. The left ventricle has no regional wall motion abnormalities. Left ventricular diastolic parameters are consistent  with Grade II diastolic dysfunction (pseudonormalization). 2. Right ventricular systolic function is normal. The right ventricular size is normal. Tricuspid regurgitation signal is inadequate for assessing PA pressure. 3. The mitral valve is grossly normal. Trivial mitral valve regurgitation. 4. The aortic valve is tricuspid. Aortic valve regurgitation is not visualized. No aortic stenosis is present. Aortic valve mean gradient measures 4.0 mmHg. 5. The inferior vena cava is normal in size with greater than 50% respiratory variability, suggesting right atrial pressure of 3 mmHg.  Comparison(s): Prior images reviewed side by side. LVEF normal range at 60-65% with moderate diastolic dysfunction.  FINDINGS Left Ventricle: Left ventricular ejection fraction, by estimation, is 60 to 65%. The left ventricle has normal function. The left ventricle has no regional wall motion abnormalities. Global longitudinal strain performed but not  reported based on interpreter judgement due to suboptimal tracking. The left ventricular internal cavity size was normal in size. There is borderline left ventricular hypertrophy. Left ventricular diastolic parameters are consistent with Grade II diastolic dysfunction (pseudonormalization).  Right Ventricle: The right ventricular size is normal. No increase in right ventricular wall thickness. Right ventricular systolic function is normal. Tricuspid regurgitation signal is inadequate for assessing PA pressure.  Left Atrium: Left atrial size was normal in size.  Right Atrium: Right atrial size was normal in size.  Pericardium: There is no evidence of pericardial effusion.  Mitral Valve: The mitral valve is grossly normal. Trivial mitral valve regurgitation. MV peak gradient, 3.0 mmHg. The mean mitral valve gradient is 1.0 mmHg.  Tricuspid Valve: The tricuspid valve is grossly normal. Tricuspid valve regurgitation is trivial.  Aortic Valve: The aortic valve is  tricuspid. Aortic valve regurgitation is not visualized. No aortic stenosis is present. Aortic valve mean gradient measures 4.0 mmHg. Aortic valve peak gradient measures 6.2 mmHg. Aortic valve area, by VTI measures 2.80 cm.  Pulmonic Valve: The pulmonic valve was grossly normal. Pulmonic valve regurgitation is not visualized.  Aorta: The aortic root and ascending aorta are structurally normal, with no evidence of dilitation.  Venous: The inferior vena cava is normal in size with greater than 50% respiratory variability, suggesting right atrial pressure of 3 mmHg.  IAS/Shunts: No atrial level shunt detected by color flow Doppler.   LEFT VENTRICLE PLAX 2D LVIDd:         4.30 cm     Diastology LVIDs:         2.60 cm     LV e' medial:    5.33 cm/s LV PW:         1.00 cm     LV E/e' medial:  18.3 LV IVS:        1.00 cm     LV e' lateral:   5.33 cm/s LVOT diam:     2.10 cm     LV E/e' lateral: 18.3 LV SV:         66 LV SV Index:   32 LVOT Area:     3.46 cm  LV Volumes (MOD) LV vol d, MOD A2C: 47.5 ml LV vol d, MOD A4C: 62.2 ml LV vol s, MOD A2C: 30.7 ml LV vol s, MOD A4C: 22.0 ml LV SV MOD A2C:     16.8 ml LV SV MOD A4C:     62.2 ml LV SV MOD BP:      28.9 ml  RIGHT VENTRICLE RV Basal diam:  2.60 cm RV Mid diam:    3.20 cm RV S prime:     15.70 cm/s TAPSE (M-mode): 2.7 cm  LEFT ATRIUM           Index        RIGHT ATRIUM           Index LA diam:      2.90 cm 1.43 cm/m   RA Area:     10.30 cm LA Vol (A4C): 26.3 ml 12.95 ml/m  RA Volume:   19.30 ml  9.50 ml/m AORTIC VALVE                    PULMONIC VALVE AV Area (Vmax):    2.56 cm     PV Vmax:       1.15 m/s AV Area (Vmean):   2.45 cm     PV Peak grad:  5.3 mmHg  AV Area (VTI):     2.80 cm AV Vmax:           124.00 cm/s AV Vmean:          89.200 cm/s AV VTI:            0.235 m AV Peak Grad:      6.2 mmHg AV Mean Grad:      4.0 mmHg LVOT Vmax:         91.60 cm/s LVOT Vmean:        63.100 cm/s LVOT VTI:           0.190 m LVOT/AV VTI ratio: 0.81  AORTA Ao Root diam: 3.00 cm Ao Asc diam:  3.30 cm  MITRAL VALVE MV Area (PHT): 3.02 cm    SHUNTS MV Area VTI:   2.47 cm    Systemic VTI:  0.19 m MV Peak grad:  3.0 mmHg    Systemic Diam: 2.10 cm MV Mean grad:  1.0 mmHg MV Vmax:       0.86 m/s MV Vmean:      53.8 cm/s MV Decel Time: 251 msec MV E velocity: 97.30 cm/s MV A velocity: 83.10 cm/s MV E/A ratio:  1.17  Nona Dell MD Electronically signed by Nona Dell MD Signature Date/Time: 06/24/2023/4:46:03 PM    Final                Assessment & Plan   CAD - with HTN and RA - complicated by morbid obesity - she is having residual pain similar to her prior PCI - has exertional angina with rest pain that brought her here; does not smoke and has taker her medications - consented for [facility] and planned for [facility] per Dr. Elease Hashimoto 07/29/23 - will get limited echo: sed rate mildly elevated; EKG does not suggest pericarditis, but if patent cors may consider treatment  - NPO at midnight - will trial diltiazem and hold home norvasc - on heparin and nitroglycerin drip Risks and benefits of cardiac catheterization have been discussed with the patient.  These include bleeding, infection, kidney damage, stroke, heart attack, death.  The patient understands these risks and is willing to proceed.  Access recommendations: R radial    For questions or updates, please contact [facility] Please consult [email or web address] for contact info under Cardiology/STEMI.      Riley Lam, MD FASE [facility] Cardiologist [facility]  [address] [place], [address] [place], [place] [address] [phone]  7:11 AM

## 2023-07-31 NOTE — Progress Notes (Signed)
ANTICOAGULATION CONSULT NOTE  Pharmacy Consult for Heparin Indication: chest pain/ACS  Allergies  Allergen Reactions   Albuterol Sulfate Shortness Of Breath    Other reaction(s): Other (See Comments)  can't breathe  Other reaction(s): Other (See Comments) can't breathe   Tape     Other reaction(s): Other (See Comments), Other (See Comments), Other (See Comments)  severe skin breakdown  Cast and Bandage Cover  severe skin breakdown  severe skin breakdown   Formoterol Other (See Comments)    Other reaction(s): Headaches  Other reaction(s): Headaches  Other reaction(s): Headaches  Headaches  Other reaction(s): Headaches Other reaction(s): Headaches    Other reaction(s): Headaches    Headaches   Hydrocodone Rash   Hydrocodone-Acetaminophen Rash   Nickel Rash and Other (See Comments)   Other Rash    cast and bandage use  cast and bandage use  cast and bandage use  cast and bandage use    cast and bandage use cast and bandage use   Oxycodone-Acetaminophen Other (See Comments)    headache   Salmeterol Rash   Strawberry Extract Hives    Full body rash from strawberry   Advair Hfa [Fluticasone-Salmeterol]     unknown   Proventil Hfa [Albuterol]     unknown   Asa [Aspirin] Rash   Cleocin [Clindamycin] Rash   Clindamycin/Lincomycin Rash   Fludrocortisone Acetate Rash   Gabapentin Rash   Hydrochlorothiazide Rash   Imipramine Rash   Ipratropium Bromide Rash   Medroxyprogesterone Rash   Meloxicam Rash   Metformin And Related Rash   Nystatin Rash   Penicillins Rash   Pred Forte [Prednisolone] Rash   Prednisone Rash   Solu-Medrol [Methylprednisolone] Rash   Sulfa Antibiotics Rash   Tegaderm Ag Mesh [Silver] Rash   Toradol [Ketorolac Tromethamine] Rash   Tramadol Rash    Patient Measurements: Height: 5\' 3"  (160 cm) Weight: 99.7 kg (219 lb 14.4 oz) IBW/kg (Calculated) : 52.4 Heparin Dosing Weight: 76.5 kg  Vital Signs: Temp: 97.4 F (36.3 C) (12/22  0746) Temp Source: Oral (12/22 0746) BP: 162/107 (12/22 1220) Pulse Rate: 97 (12/22 1220)  Labs: Recent Labs    07/29/23 1420 07/29/23 1749 07/30/23 0741 07/30/23 1453 07/30/23 2103 07/31/23 0402 07/31/23 1235  HGB 13.6  --  12.6  --   --  12.1  --   HCT 41.6  --  39.4  --   --  37.5  --   PLT 337  --  304  --   --  303  --   HEPARINUNFRC  --   --  0.13*   < > 0.25* 0.38 0.40  CREATININE 0.78  --  0.71  --   --  0.72  --   TROPONINIHS 3 3  --   --   --   --   --    < > = values in this interval not displayed.    Estimated Creatinine Clearance: 90.5 mL/min (by C-G formula based on SCr of 0.72 mg/dL).   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 Atopic dermatitis Formatting of this note might be different from the original. Formatting of this note might be different from the original. Atopic dermatitis Formatting of this  note might be different from the original. Formatting of this note might be different from the original. Formatting of this note might be different from the or   Cataract 05/06/2015   Contusion of left wrist 07/08/2021   Crohn's disease (HCC) 04/21/2015   De Quervain's tenosynovitis, left 07/31/2021   Diabetes mellitus without complication (HCC)    Diabetes type 2, controlled (HCC) 04/21/2015   Dry eyes 05/06/2015   DVT (deep venous thrombosis) (HCC)    Dysfunction of both eustachian tubes 08/31/2012   Encounter for monitoring immunomodulating therapy 11/20/2021   Environmental allergies 08/12/2020   Esophageal candidiasis (HCC) 06/26/2022   Fibrositis 08/28/2014   Formatting of this note might be different from the original. Fibromyalgia Formatting of this  note might be different from the original. Formatting of this note might be different from the original. Fibromyalgia   Flank pain 11/24/2020   Gastroesophageal reflux disease with esophagitis without hemorrhage 08/01/2019   Formatting of this note might be different from the original. Formatting of this note might be different from the original. 07/2019: chronic symptoms of esophageal reflux since prior to sleeve gastrectomy surgery. Symptoms have worsened over the last 6 months with recent EGD findings of grade B esophagitis, irregular z line, erythematous mucosa, and evidence of sleeve gastrectomy. 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 Formatting of this note might be different from the original. 07/2019: 8 mm colonic polyp removed during colonoscopy, biopsy showed serrated polyp. Repeat colonoscopy recommended in one year. Formatting of this note might be different from the original. 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 Hypertension Formatting of this note might be different from the original. Formatting of this note might be different from the original. 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 Keratoconus Formatting of this note might be different from the original.  Formatting of this note might be different from the original. Keratoconus Formatting of this note might be different from the original. Formatting of this note might be different from the original. Keratoconus Formatting of this note might be different from the or   Microhematuria 06/27/2015   Migraine 08/28/2014   Formatting of this note might be different from the original. Migraine Formatting of this note might be different from the original. Formatting of this note might be different from the original. Migraine Formatting of this note might be different from the original. Migraine Formatting of this note might be different from the original. Migraine Formatting of this note might be different from the or   Migraine without aura and without status migrainosus, not intractable 08/28/2014   Formatting of this note might be different from the original. Formatting of this note might be different from the original. Formatting of this note might be different from the original. Migraine Formatting of this note might be  different from the original. Migraine Formatting of this note might be different from the original. Formatting of this note might be different from the original. Migraine F   Mild intermittent asthma without complication 10/30/2008   Formatting of this note might be different from the original. Formatting of this note might be different from the original. Formatting of this note might be different from the original. Asthma Formatting of this note might be different from the original. Asthma Formatting of this note might be different from the original. Formatting of this note might be different from the original. 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 Formatting of this note might be different from the original. 01/25/2016 with Dr. Dionisio David at [facility]. [place] of this note might be different from the original. 01/25/2016 with Dr. Dionisio David at [facility]. [place] of this note might be different from the original. Formatting of this note might be different from    Seronegative inflammatory arthritis 08/28/2014   Formatting of this note might be different from the original. Formatting of this note might be different from the original. Arthritis; diagnosed as IBD related - 2012 Formatting of this note might be different from the original. Followed by rheumatologist. Had to change providers due to an insurance change earlier this year resulting in patient not being able to have Remicade for ~6 months. 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 Formatting of this note might be different from the original. Treated with PRN miralax. Recent colonoscopy 07/2019. Formatting of this note might be different from the original. Treated with PRN miralax. Recent colonoscopy 07/2019. Formatting of this note might be different from the original. Formatting of this note might be different f   Status post placement of ureteral stent 02/06/2021   Tooth infection 08/12/2020   Ureteral stone 01/20/2022   Urinary tract infection, site not specified 06/26/2022    Medications:  Medications Prior to Admission  Medication  Sig Dispense Refill Last Dose/Taking   acetaminophen (TYLENOL) 500 MG tablet Take 1,000 mg by mouth every 12 (twelve) hours as needed for mild pain or moderate pain.   Past Week Morning   albuterol (PROVENTIL) (2.5 MG/3ML) 0.083% nebulizer solution Take 3 mLs (2.5 mg total) by nebulization every 6 (six) hours as needed for wheezing or shortness of breath. 150 mL 1 Past Month Morning   amLODipine (NORVASC) 5 MG tablet Take 1 tablet (5 mg total) by mouth daily. 30 tablet 2 07/29/2023 Morning   atorvastatin (LIPITOR) 80 MG tablet Take 1 tablet (80 mg total) by mouth daily. 30 tablet 2 07/29/2023 Morning   B Complex-C (B-COMPLEX WITH VITAMIN C) tablet Take 1 tablet by mouth daily.   07/29/2023 Morning   Cholecalciferol (VITAMIN D3) 50 MCG (2000 UT) TABS Take 1  tablet by mouth daily.   07/29/2023   EPINEPHrine (EPIPEN 2-PAK) 0.3 mg/0.3 mL IJ SOAJ injection Inject 0.3 mg into the muscle as needed for anaphylaxis. 2 each 2 Past Month   esomeprazole (NEXIUM) 40 MG capsule Take 1 capsule (40 mg total) by mouth 2 (two) times daily before a meal. (Patient taking differently: Take 80 mg by mouth daily.) 60 capsule 11 07/29/2023 Morning   famotidine (PEPCID) 40 MG tablet Take 1 tablet (40 mg total) by mouth daily. 30 tablet 11 07/29/2023 Morning   Golimumab (SIMPONI ARIA IV) Inject 200 mg into the vein every 8 (eight) weeks.   Past Month Morning   levocetirizine (XYZAL) 5 MG tablet Take 10 mg by mouth in the morning.   07/29/2023 Morning   linaclotide (LINZESS) 72 MCG capsule Take 144 mcg by mouth daily before breakfast.   07/29/2023 Morning   mirabegron ER (MYRBETRIQ) 50 MG TB24 tablet Take 1 tablet (50 mg total) by mouth daily. 30 tablet 11 07/29/2023 Morning   montelukast (SINGULAIR) 10 MG tablet Take 1 tablet (10 mg total) by mouth at bedtime. (Patient taking differently: Take 10 mg by mouth daily.) 30 tablet 11 07/29/2023 Morning   nitroGLYCERIN (NITROSTAT) 0.4 MG SL tablet Place 1 tablet (0.4 mg total) under  the tongue every 5 (five) minutes as needed for chest pain. 25 tablet 6 07/29/2023 Morning   OXYGEN Inhale 2 L into the lungs at bedtime.   07/28/2023 Bedtime   ticagrelor (BRILINTA) 90 MG TABS tablet Take 1 tablet (90 mg total) by mouth 2 (two) times daily. 60 tablet 5 07/29/2023 at 10:00 AM   ZOLMitriptan (ZOMIG) 2.5 MG tablet Take 1 tablet (2.5 mg total) by mouth once for 1 dose. May repeat in 2 hours if headache persists or recurs. (Patient taking differently: Take 2.5 mg by mouth daily as needed for migraine.) 10 tablet 1 Past Month   Infusions:   sodium chloride     cefTRIAXone (ROCEPHIN)  IV     dextrose 5 % and 0.9 % NaCl with KCl 20 mEq/L     heparin 1,250 Units/hr (07/31/23 1229)   nitroGLYCERIN 15 mcg/min (07/31/23 0413)   potassium chloride 10 mEq (07/31/23 1034)    Assessment: 52 yo F presents to the ED with mid-sternal chest pain that started on 12/19 pm. Pt took nitroglycerin SL x3 overnight and continued to have chest pain. Pt is s/p PCI with DES to LAD on 07/01/23 and was initiated on ticagrelor x6 months. Pt does not take any anticoagulation at home. Pharmacy consulted to dose heparin infusion per ACS protocol.   Confirmatory heparin level therapeutic at 0.4 on heparin 1250 units/hr. Hgb/plt wnl. No signs of bleeding or issues with infusion noted.   Goal of Therapy:  Heparin level 0.3-0.7 units/ml Monitor platelets by anticoagulation protocol: Yes   Plan:  Continue heparin infusion at 1250 units/hr Daily heparin level, CBC  Continue to monitor H&H and platelets and for s/sx of bleeding  Thank you for involving pharmacy in the patient's care.   Theotis Burrow, PharmD PGY1 Acute Care Pharmacy Resident  07/31/2023 1:09 PM

## 2023-08-01 ENCOUNTER — Encounter (HOSPITAL_COMMUNITY): Payer: 59

## 2023-08-01 ENCOUNTER — Encounter (HOSPITAL_COMMUNITY)
Admission: EM | Disposition: A | Discharge status: Home / Self Care | Payer: Self-pay | Source: Home / Self Care | Attending: Internal Medicine

## 2023-08-01 ENCOUNTER — Encounter: Payer: Self-pay | Admitting: Cardiology

## 2023-08-01 DIAGNOSIS — I25110 Atherosclerotic heart disease of native coronary artery with unstable angina pectoris: Secondary | ICD-10-CM | POA: Diagnosis not present

## 2023-08-01 DIAGNOSIS — R700 Elevated erythrocyte sedimentation rate: Secondary | ICD-10-CM | POA: Diagnosis not present

## 2023-08-01 DIAGNOSIS — M0609 Rheumatoid arthritis without rheumatoid factor, multiple sites: Secondary | ICD-10-CM | POA: Diagnosis not present

## 2023-08-01 DIAGNOSIS — I200 Unstable angina: Secondary | ICD-10-CM | POA: Diagnosis not present

## 2023-08-01 DIAGNOSIS — I10 Essential (primary) hypertension: Secondary | ICD-10-CM | POA: Diagnosis not present

## 2023-08-01 HISTORY — PX: LEFT HEART CATH AND CORONARY ANGIOGRAPHY: CATH118249

## 2023-08-01 LAB — BASIC METABOLIC PANEL
Anion gap: 9 (ref 5–15)
BUN: 7 mg/dL (ref 6–20)
CO2: 20 mmol/L — ABNORMAL LOW (ref 22–32)
Calcium: 8.9 mg/dL (ref 8.9–10.3)
Chloride: 108 mmol/L (ref 98–111)
Creatinine, Ser: 0.64 mg/dL (ref 0.44–1.00)
GFR, Estimated: >60 mL/min (ref >60)
Glucose, Bld: 119 mg/dL — ABNORMAL HIGH (ref 70–99)
Potassium: 3.8 mmol/L (ref 3.5–5.1)
Sodium: 137 mmol/L (ref 135–145)

## 2023-08-01 LAB — CBC
HCT: 36 % (ref 36.0–46.0)
Hemoglobin: 11.6 g/dL — ABNORMAL LOW (ref 12.0–15.0)
MCH: 26.5 pg (ref 26.0–34.0)
MCHC: 32.2 g/dL (ref 30.0–36.0)
MCV: 82.4 fL (ref 80.0–100.0)
Platelets: 264 10*3/uL (ref 150–400)
RBC: 4.37 MIL/uL (ref 3.87–5.11)
RDW: 17.5 % — ABNORMAL HIGH (ref 11.5–15.5)
WBC: 14.1 10*3/uL — ABNORMAL HIGH (ref 4.0–10.5)
nRBC: 0 % (ref 0.0–0.2)

## 2023-08-01 LAB — URINE CULTURE: Culture: 20000 — AB

## 2023-08-01 LAB — HEPARIN LEVEL (UNFRACTIONATED): Heparin Unfractionated: 0.4 [IU]/mL (ref 0.30–0.70)

## 2023-08-01 SURGERY — LEFT HEART CATH AND CORONARY ANGIOGRAPHY
Anesthesia: LOCAL

## 2023-08-01 MED ORDER — HEPARIN (PORCINE) IN NACL 1000-0.9 UT/500ML-% IV SOLN
INTRAVENOUS | Status: DC | PRN
  Administered 2023-08-01 (×2): 500 mL

## 2023-08-01 MED ORDER — MIDAZOLAM HCL 2 MG/2ML IJ SOLN
INTRAMUSCULAR | Status: DC | PRN
  Administered 2023-08-01: 2 mg via INTRAVENOUS

## 2023-08-01 MED ORDER — ESOMEPRAZOLE MAGNESIUM 40 MG PO CPDR
80.0000 mg | DELAYED_RELEASE_CAPSULE | Freq: Every day | ORAL | Status: DC
  Administered 2023-08-01: 80 mg via ORAL
  Filled 2023-08-01 (×3): qty 2

## 2023-08-01 MED ORDER — NON FORMULARY: 20.0000 mg | Freq: Every day | Status: DC

## 2023-08-01 MED ORDER — LIDOCAINE HCL (PF) 1 % IJ SOLN
INTRAMUSCULAR | Status: AC
  Filled 2023-08-01: qty 30

## 2023-08-01 MED ORDER — IOHEXOL 350 MG/ML SOLN
INTRAVENOUS | Status: DC | PRN
  Administered 2023-08-01: 30 mL

## 2023-08-01 MED ORDER — ESOMEPRAZOLE MAGNESIUM 20 MG PO CPDR: 80.0000 mg | DELAYED_RELEASE_CAPSULE | Freq: Every day | ORAL | Status: DC

## 2023-08-01 MED ORDER — DIPHENHYDRAMINE-ZINC ACETATE 2-0.1 % EX CREA
TOPICAL_CREAM | Freq: Three times a day (TID) | CUTANEOUS | Status: DC | PRN
  Filled 2023-08-01: qty 28

## 2023-08-01 MED ORDER — SODIUM CHLORIDE 0.9% FLUSH
3.0000 mL | Freq: Two times a day (BID) | INTRAVENOUS | Status: DC
  Administered 2023-08-01: 3 mL via INTRAVENOUS

## 2023-08-01 MED ORDER — FENTANYL CITRATE (PF) 100 MCG/2ML IJ SOLN
INTRAMUSCULAR | Status: DC | PRN
  Administered 2023-08-01: 25 ug via INTRAVENOUS

## 2023-08-01 MED ORDER — LIDOCAINE HCL (PF) 1 % IJ SOLN
INTRAMUSCULAR | Status: DC | PRN
  Administered 2023-08-01: 2 mL

## 2023-08-01 MED ORDER — ESOMEPRAZOLE MAGNESIUM 20 MG PO CPDR
20.0000 mg | DELAYED_RELEASE_CAPSULE | Freq: Every day | ORAL | Status: DC
  Filled 2023-08-01: qty 1

## 2023-08-01 MED ORDER — FENTANYL CITRATE (PF) 100 MCG/2ML IJ SOLN
INTRAMUSCULAR | Status: AC
  Filled 2023-08-01: qty 2

## 2023-08-01 MED ORDER — ESOMEPRAZOLE MAGNESIUM 20 MG PO CPDR
80.0000 mg | DELAYED_RELEASE_CAPSULE | Freq: Every day | ORAL | Status: DC
  Filled 2023-08-01 (×2): qty 4

## 2023-08-01 MED ORDER — MIDAZOLAM HCL 2 MG/2ML IJ SOLN
INTRAMUSCULAR | Status: AC
Start: 2023-08-01 — End: ?
  Filled 2023-08-01: qty 2

## 2023-08-01 MED ORDER — VERAPAMIL HCL 2.5 MG/ML IV SOLN
INTRAVENOUS | Status: DC | PRN
  Administered 2023-08-01: 5 mL via INTRA_ARTERIAL

## 2023-08-01 MED ORDER — HEPARIN SODIUM (PORCINE) 1000 UNIT/ML IJ SOLN
INTRAMUSCULAR | Status: DC | PRN
  Administered 2023-08-01: 5000 [IU] via INTRAVENOUS

## 2023-08-01 SURGICAL SUPPLY — 9 items
CATH INFINITI AMBI 5FR TG (CATHETERS) IMPLANT
CATH INFINITI JR4 5F (CATHETERS) IMPLANT
DEVICE RAD COMP TR BAND LRG (VASCULAR PRODUCTS) IMPLANT
GLIDESHEATH SLEND A-KIT 6F 22G (SHEATH) IMPLANT
GUIDEWIRE INQWIRE 1.5J.035X260 (WIRE) IMPLANT
INQWIRE 1.5J .035X260CM (WIRE) ×1
PACK CARDIAC CATHETERIZATION (CUSTOM PROCEDURE TRAY) ×1 IMPLANT
SET ATX-X65L (MISCELLANEOUS) IMPLANT
SHEATH PROBE COVER 6X72 (BAG) IMPLANT

## 2023-08-01 NOTE — Progress Notes (Addendum)
Patient Name: Casey [name] Date of Encounter: 08/01/2023 [facility] Cardiologist: Gypsy Balsam, MD   Interval Summary  .    Patient is doing well. She notes that the chest pain and shortness of breath feel similar but less intense when compared to her symptoms a month ago. She states her chest pain is mild at this time and says she has some shortness of breath when getting to walk around. She ambulated from the chair to the bed when I was in the room without any difficulty.  Vital Signs .    Vitals:   07/31/23 1708 07/31/23 2011 08/01/23 0026 08/01/23 0445  BP: 132/82 (!) 153/84 137/75 (!) 151/87  Pulse: 93 90 78 76  Resp: 20 16 17 19   Temp: 98.2 F (36.8 C) 98.2 F (36.8 C) 98.1 F (36.7 C) 98 F (36.7 C)  TempSrc: Oral Oral Oral Oral  SpO2: 94%   95%  Weight:      Height:        Intake/Output Summary (Last 24 hours) at 08/01/2023 1004 Last data filed at 08/01/2023 0351 Gross per 24 hour  Intake 3147.46 ml  Output --  Net 3147.46 ml      07/30/2023    6:00 PM 07/29/2023    1:30 PM 07/21/2023    9:54 AM  Last 3 Weights  Weight (lbs) 219 lb 14.4 oz 225 lb 221 lb 9 oz  Weight (kg) 99.746 kg 102.059 kg 100.5 kg      Telemetry/ECG    Sinus rhythm - Personally Reviewed  Physical Exam .   GEN: No acute distress.   Neck: No JVD Cardiac: RRR, no murmurs, rubs, or gallops.  Respiratory: Clear to auscultation bilaterally. GI: Soft, nontender, non-distended  MS: No edema  Assessment & Plan .     Chest Pain Unstable Angina CAD s/p DES to LAD 07/01/2023 Patient reported recurrent unstable anginal symptoms 12/19 where she took sublingual nitroglycerin with some transient relief.  Negative troponin x 2. EKG without significant ischemic changes  Echo from 12/22 showed LVEF 60-65%, normal LV function, no regional wall abnormalities, normal RV size and systolic function, small pericardial effusion, normal IVC -- Continue Brilinta 90 mg BID, continue  heparin, continue nitroglycerin drip  -- NPO today -- Scheduled for LHC   Hypertension -- Holding PO meds while on nitroglycerin drip -- Consider adding amlodipine after cath for BP control and anti-anginal benefit   Hyperlipidemia -- Continue atorvostatin 80 mg   Hypokalemia -- Resolving with IV repletion per primary team, most recent value 3.8  Moderate persistent asthma -- Continue Singuilar 10 mg and albuterol nebulizer PRN per primary team  Rheumatoid Arthritis Patient notes that she usually experiences flares with her RA during hospitalizations/procedures.  She is due for her next infusion of Simponi on 11/13. She denies any joint pain at this time -- Small pericardial effusion seen on echo from 12/22 -- CRP normal 0.8, Sed rate elevated at 40, WBC elevated at 14.1   Urinary Tract Infection Patient reports not completing outpatient treatment for UTI -- Continue ceftriaxone and Diflucan per primary team   For questions or updates, please contact [facility] Please consult [email or web address] for contact info under    Signed, Olena Leatherwood, PA-C    Patient seen and examined. Agree with assessment and plan.  Patient has experienced recurrent chest pain symptomatology which she states is similar to the pain that she experienced 1 month ago.  Troponins currently negative.  He  is on the schedule today for repeat catheterization.  Sed rate is elevated at 40.  Patient aware of risk benefits of the procedure.  Plan later this morning or early afternoon.   Lennette Bihari, MD, [facility] 08/01/2023 11:17 AM

## 2023-08-01 NOTE — H&P (View-Only) (Signed)
Patient Name: Casey [name] Date of Encounter: 08/01/2023 [facility] Cardiologist: Gypsy Balsam, MD   Interval Summary  .    Patient is doing well. She notes that the chest pain and shortness of breath feel similar but less intense when compared to her symptoms a month ago. She states her chest pain is mild at this time and says she has some shortness of breath when getting to walk around. She ambulated from the chair to the bed when I was in the room without any difficulty.  Vital Signs .    Vitals:   07/31/23 1708 07/31/23 2011 08/01/23 0026 08/01/23 0445  BP: 132/82 (!) 153/84 137/75 (!) 151/87  Pulse: 93 90 78 76  Resp: 20 16 17 19   Temp: 98.2 F (36.8 C) 98.2 F (36.8 C) 98.1 F (36.7 C) 98 F (36.7 C)  TempSrc: Oral Oral Oral Oral  SpO2: 94%   95%  Weight:      Height:        Intake/Output Summary (Last 24 hours) at 08/01/2023 1004 Last data filed at 08/01/2023 0351 Gross per 24 hour  Intake 3147.46 ml  Output --  Net 3147.46 ml      07/30/2023    6:00 PM 07/29/2023    1:30 PM 07/21/2023    9:54 AM  Last 3 Weights  Weight (lbs) 219 lb 14.4 oz 225 lb 221 lb 9 oz  Weight (kg) 99.746 kg 102.059 kg 100.5 kg      Telemetry/ECG    Sinus rhythm - Personally Reviewed  Physical Exam .   GEN: No acute distress.   Neck: No JVD Cardiac: RRR, no murmurs, rubs, or gallops.  Respiratory: Clear to auscultation bilaterally. GI: Soft, nontender, non-distended  MS: No edema  Assessment & Plan .     Chest Pain Unstable Angina CAD s/p DES to LAD 07/01/2023 Patient reported recurrent unstable anginal symptoms 12/19 where she took sublingual nitroglycerin with some transient relief.  Negative troponin x 2. EKG without significant ischemic changes  Echo from 12/22 showed LVEF 60-65%, normal LV function, no regional wall abnormalities, normal RV size and systolic function, small pericardial effusion, normal IVC -- Continue Brilinta 90 mg BID, continue  heparin, continue nitroglycerin drip  -- NPO today -- Scheduled for LHC   Hypertension -- Holding PO meds while on nitroglycerin drip -- Consider adding amlodipine after cath for BP control and anti-anginal benefit   Hyperlipidemia -- Continue atorvostatin 80 mg   Hypokalemia -- Resolving with IV repletion per primary team, most recent value 3.8  Moderate persistent asthma -- Continue Singuilar 10 mg and albuterol nebulizer PRN per primary team  Rheumatoid Arthritis Patient notes that she usually experiences flares with her RA during hospitalizations/procedures.  She is due for her next infusion of Simponi on 11/13. She denies any joint pain at this time -- Small pericardial effusion seen on echo from 12/22 -- CRP normal 0.8, Sed rate elevated at 40, WBC elevated at 14.1   Urinary Tract Infection Patient reports not completing outpatient treatment for UTI -- Continue ceftriaxone and Diflucan per primary team   For questions or updates, please contact [facility] Please consult [email or web address] for contact info under    Signed, Olena Leatherwood, PA-C    Patient seen and examined. Agree with assessment and plan.  Patient has experienced recurrent chest pain symptomatology which she states is similar to the pain that she experienced 1 month ago.  Troponins currently negative.  He  is on the schedule today for repeat catheterization.  Sed rate is elevated at 40.  Patient aware of risk benefits of the procedure.  Plan later this morning or early afternoon.   Lennette Bihari, MD, [facility] 08/01/2023 11:17 AM

## 2023-08-01 NOTE — Plan of Care (Signed)
   Problem: Education: Goal: Knowledge of General Education information will improve Description: Including pain rating scale, medication(s)/side effects and non-pharmacologic comfort measures Outcome: Progressing   Problem: Health Behavior/Discharge Planning: Goal: Ability to manage health-related needs will improve Outcome: Progressing   Problem: Clinical Measurements: Goal: Ability to maintain clinical measurements within normal limits will improve Outcome: Progressing Goal: Will remain free from infection Outcome: Progressing Goal: Diagnostic test results will improve Outcome: Progressing Goal: Respiratory complications will improve Outcome: Progressing Goal: Cardiovascular complication will be avoided Outcome: Progressing   Problem: Activity: Goal: Risk for activity intolerance will decrease Outcome: Progressing   Problem: Nutrition: Goal: Adequate nutrition will be maintained Outcome: Progressing   Problem: Coping: Goal: Level of anxiety will decrease Outcome: Progressing   Problem: Elimination: Goal: Will not experience complications related to bowel motility Outcome: Progressing Goal: Will not experience complications related to urinary retention Outcome: Progressing   Problem: Pain Management: Goal: General experience of comfort will improve Outcome: Progressing   Problem: Safety: Goal: Ability to remain free from injury will improve Outcome: Progressing   Problem: Skin Integrity: Goal: Risk for impaired skin integrity will decrease Outcome: Progressing   Problem: Education: Goal: Understanding of cardiac disease, CV risk reduction, and recovery process will improve Outcome: Progressing Goal: Individualized Educational Video(s) Outcome: Progressing   Problem: Activity: Goal: Ability to tolerate increased activity will improve Outcome: Progressing   Problem: Cardiac: Goal: Ability to achieve and maintain adequate cardiovascular perfusion will  improve Outcome: Progressing   Problem: Health Behavior/Discharge Planning: Goal: Ability to safely manage health-related needs after discharge will improve Outcome: Progressing   Problem: Education: Goal: Understanding of CV disease, CV risk reduction, and recovery process will improve Outcome: Progressing Goal: Individualized Educational Video(s) Outcome: Progressing   Problem: Activity: Goal: Ability to return to baseline activity level will improve Outcome: Progressing   Problem: Cardiovascular: Goal: Ability to achieve and maintain adequate cardiovascular perfusion will improve Outcome: Progressing Goal: Vascular access site(s) Level 0-1 will be maintained Outcome: Progressing   Problem: Health Behavior/Discharge Planning: Goal: Ability to safely manage health-related needs after discharge will improve Outcome: Progressing

## 2023-08-01 NOTE — Progress Notes (Signed)
PROGRESS NOTE    Casey [name]  UJW:[id number] DOB: [birth year] DOA: 07/29/2023 PCP: Dimple Nanas    Brief Narrative:  52 year old with history of rheumatoid arthritis, coronary artery disease with recent LAD stenting on 07/01/2023, moderate persistent asthma, hypertension, hyperlipidemia, history of DVT but not currently on anticoagulation presented to the emergency room with chest pressure sensation.  Due to recent cardiac stents, she was admitted to the hospital and treated with heparin infusion and nitroglycerin infusion with clinical improvement.  Waiting for cardiac cath today.  Subjective: Patient seen in the morning rounds.  Denies any ongoing chest pain.  Patient explained about 1 out of 10 pressure sensation on her mid sternum over last 24 hours without any significant recurrence of severe chest pain.  Denies any other complaints. Assessment & Plan:   Chest pain, unstable angina, recent drug-eluting stent of the LAD. Currently remains hemodynamically stable.  Troponins negative.  EKG nonischemic.  Followed by cardiology. Remains on heparin drip and nitroglycerin drip. On Cardizem.  Brilinta and atorvastatin. Cardiac cath today.  GNR, urinary tract infection: Incompletely treated.  Complained of yeast infection.  Currently remains on Diflucan, will complete 3 days of therapy.  She is also on Rocephin day 2/3.  Urine culture with 20K GNR's.  Chronic medical issues including Essential hypertension, blood pressure stable.  Off amlodipine.  On Cardizem. Hyperlipidemia, on high intensity statin. Rheumatoid arthritis, she is on Simponi every 8 weeks. GERD, on Protonix and famotidine. Morbid obesity, class II: Will benefit with weight loss and exercise.   DVT prophylaxis: Heparin infusion   Code Status: Full code Family Communication: None at the bedside Disposition Plan: Status is: Inpatient Remains inpatient appropriate because: Inpatient procedures planned      Consultants:  Cardiology  Procedures:  None  Antimicrobials:  None     Objective: Vitals:   08/01/23 1216 08/01/23 1221 08/01/23 1226 08/01/23 1237  BP: (!) 154/75 (!) 145/74 (!) 145/74 (!) 152/79  Pulse: 86 84 (!) 0 76  Resp: 20 (!) 21  17  Temp:      TempSrc:      SpO2: 100% 100% 100%   Weight:      Height:        Intake/Output Summary (Last 24 hours) at 08/01/2023 1342 Last data filed at 08/01/2023 0900 Gross per 24 hour  Intake 3267.46 ml  Output --  Net 3267.46 ml   Filed Weights   07/29/23 1330 07/30/23 1800  Weight: 102.1 kg 99.7 kg    Examination:  General exam: Appears calm and comfortable.  Pleasant interaction.  Sitting in couch.  On room air. Respiratory system: Clear to auscultation. Respiratory effort normal. Cardiovascular system: S1 & S2 heard, RRR. No JVD, murmurs, rubs, gallops or clicks. No pedal edema. Gastrointestinal system: Abdomen is nondistended, soft and nontender. No organomegaly or masses felt. Normal bowel sounds heard. Central nervous system: Alert and oriented. No focal neurological deficits. Extremities: Symmetric 5 x 5 power. Skin: No rashes, lesions or ulcers Psychiatry: Judgement and insight appear normal. Mood & affect appropriate.     Data Reviewed: I have personally reviewed following labs and imaging studies  CBC: Recent Labs  Lab 07/29/23 1420 07/30/23 0741 07/31/23 0402 08/01/23 0401  WBC 10.1 10.8* 11.8* 14.1*  HGB 13.6 12.6 12.1 11.6*  HCT 41.6 39.4 37.5 36.0  MCV 81.7 80.9 81.7 82.4  PLT 337 304 303 264   Basic Metabolic Panel: Recent Labs  Lab 07/29/23 1420 07/30/23 0741 07/31/23 0402 08/01/23 0401  NA 139 139 140 137  K 3.5 3.4* 3.6 3.8  CL 103 104 107 108  CO2 25 22 23  20*  GLUCOSE 111* 117* 120* 119*  BUN 9 9 10 7   CREATININE 0.78 0.71 0.72 0.64  CALCIUM 9.4 9.2 9.1 8.9  MG  --   --  1.9  --    GFR: Estimated Creatinine Clearance: 90.5 mL/min (by C-G formula based on SCr of 0.64  mg/dL). Liver Function Tests: No results for input(s): "AST", "ALT", "ALKPHOS", "BILITOT", "PROT", "ALBUMIN" in the last 168 hours. No results for input(s): "LIPASE", "AMYLASE" in the last 168 hours. No results for input(s): "AMMONIA" in the last 168 hours. Coagulation Profile: No results for input(s): "INR", "PROTIME" in the last 168 hours. Cardiac Enzymes: No results for input(s): "CKTOTAL", "CKMB", "CKMBINDEX", "TROPONINI" in the last 168 hours. BNP (last 3 results) No results for input(s): "PROBNP" in the last 8760 hours. HbA1C: No results for input(s): "HGBA1C" in the last 72 hours. CBG: No results for input(s): "GLUCAP" in the last 168 hours. Lipid Profile: No results for input(s): "CHOL", "HDL", "LDLCALC", "TRIG", "CHOLHDL", "LDLDIRECT" in the last 72 hours. Thyroid Function Tests: No results for input(s): "TSH", "T4TOTAL", "FREET4", "T3FREE", "THYROIDAB" in the last 72 hours. Anemia Panel: No results for input(s): "VITAMINB12", "FOLATE", "FERRITIN", "TIBC", "IRON", "RETICCTPCT" in the last 72 hours. Sepsis Labs: No results for input(s): "PROCALCITON", "LATICACIDVEN" in the last 168 hours.  Recent Results (from the past 240 hours)  Urine Culture (for pregnant, neutropenic or urologic patients or patients with an indwelling urinary catheter)     Status: Abnormal   Collection Time: 07/30/23  8:02 AM   Specimen: Urine, Clean Catch  Result Value Ref Range Status   Specimen Description URINE, CLEAN CATCH  Final   Special Requests   Final    NONE Performed at [facility], [address], [place], [place] [address]    Culture 20,000 COLONIES/mL ESCHERICHIA COLI (A)  Final   Report Status 08/01/2023 FINAL  Final   Organism ID, Bacteria ESCHERICHIA COLI (A)  Final      Susceptibility   Escherichia coli - MIC*    AMPICILLIN >=32 RESISTANT Resistant     CEFAZOLIN <=4 SENSITIVE Sensitive     CEFEPIME <=0.12 SENSITIVE Sensitive     CEFTRIAXONE <=0.25 SENSITIVE Sensitive      CIPROFLOXACIN <=0.25 SENSITIVE Sensitive     GENTAMICIN <=1 SENSITIVE Sensitive     IMIPENEM <=0.25 SENSITIVE Sensitive     NITROFURANTOIN <=16 SENSITIVE Sensitive     TRIMETH/SULFA <=20 SENSITIVE Sensitive     AMPICILLIN/SULBACTAM >=32 RESISTANT Resistant     PIP/TAZO 8 SENSITIVE Sensitive ug/mL    * 20,000 COLONIES/mL ESCHERICHIA COLI         Radiology Studies: CARDIAC CATHETERIZATION Result Date: 08/01/2023 Images from the original result were not included.   RPAV lesion is 40% stenosed.   Prox Cx to Mid Cx lesion is 20% stenosed. Left Heart Catheterization 08/01/23: Hemodynamic data: LV: 139/-2, EDP 15 mmHg.  Ao 127/78, mean 99 mmHg.  No pressure gradient across the aortic valve. Angiographic data: LV: Normal LV systolic function, EF 55 to 60%.  No regional wall motion abnormality in the RAO projection. LM: Large-caliber vessel.  It is smooth and normal. LAD: Gives origin to large D1.  Just after the D1, previously placed Synergy 3.0 x 20 mm stent on 08/30/2022 is widely patent.  There is mild disease in the mid to distal LAD and apical LAD. LCx:  Gives origin to a very small OM1, large OM 2 and continues in the AV groove.  There is mild disease noted in the proximal segment. RCA: Large-caliber vessel nondominant vessel.  Gives origin to large PDA and a larger PL branch which has ostial 40% stenosis.  RCA itself is mildly diffusely diseased. Impression and recommendations: Widely patent stent in the mid LAD, no significant change in coronary anatomy otherwise.  Continue aggressive risk modification and secondary prevention.  In the absence of EKG changes, negative troponin leak, evaluate for noncardiac causes of chest pain versus coronary spasm.   ECHOCARDIOGRAM LIMITED Result Date: 07/31/2023    ECHOCARDIOGRAM LIMITED REPORT   Patient Name:   Casey [name] Date of Exam: 07/31/2023 Medical Rec #:  [id number]    Height:       63.0 in Accession #:    [id number]   Weight:       219.9 lb Date of  Birth:  Jul 13, 1971    BSA:          2.013 m Patient Age:    52 years     BP:           162/107 mmHg Patient Gender: F            HR:           82 bpm. Exam Location:  Inpatient Procedure: Limited Echo and Cardiac Doppler Indications:    Pericardial Effusion l31.3  History:        Patient has prior history of Echocardiogram examinations, most                 recent 06/24/2023. Risk Factors:Dyslipidemia, Diabetes,                 Hypertension and Non-Smoker. Morbid obesity.  Sonographer:    Celesta Gentile RCS Referring Phys: [id number] [facility] A CHANDRASEKHAR IMPRESSIONS  1. Left ventricular ejection fraction, by estimation, is 60 to 65%. The left ventricle has normal function. The left ventricle has no regional wall motion abnormalities.  2. Right ventricular systolic function is normal. The right ventricular size is normal.  3. A small pericardial effusion is present.  4. The inferior vena cava is normal in size with greater than 50% respiratory variability, suggesting right atrial pressure of 3 mmHg. FINDINGS  Left Ventricle: Left ventricular ejection fraction, by estimation, is 60 to 65%. The left ventricle has normal function. The left ventricle has no regional wall motion abnormalities. Right Ventricle: The right ventricular size is normal. No increase in right ventricular wall thickness. Right ventricular systolic function is normal. Pericardium: A small pericardial effusion is present. Venous: The inferior vena cava is normal in size with greater than 50% respiratory variability, suggesting right atrial pressure of 3 mmHg. Epifanio Lesches MD Electronically signed by Epifanio Lesches MD Signature Date/Time: 07/31/2023/3:34:17 PM    Final         Scheduled Meds:  atorvastatin  80 mg Oral Daily   famotidine  40 mg Oral Daily   fluconazole  100 mg Oral Daily   linaclotide  144 mcg Oral QAC breakfast   montelukast  10 mg Oral Daily   ondansetron (ZOFRAN) IV  4 mg Intravenous Once   pantoprazole   80 mg Oral Q1200   sodium chloride flush  3 mL Intravenous Q12H   sodium chloride flush  3 mL Intravenous Q12H   ticagrelor  90 mg Oral BID   Continuous Infusions:  cefTRIAXone (ROCEPHIN)  IV 1 g (08/01/23 1324)  nitroGLYCERIN 10 mcg/min (08/01/23 0351)     LOS: 3 days    Time spent: 35 minutes    Dorcas Carrow, MD [facility]

## 2023-08-01 NOTE — Progress Notes (Signed)
ANTICOAGULATION CONSULT NOTE  Pharmacy Consult for Heparin Indication: chest pain/ACS  Allergies  Allergen Reactions   Albuterol Sulfate Shortness Of Breath    Other reaction(s): Other (See Comments)  can't breathe  Other reaction(s): Other (See Comments) can't breathe   Tape     Other reaction(s): Other (See Comments), Other (See Comments), Other (See Comments)  severe skin breakdown  Cast and Bandage Cover  severe skin breakdown  severe skin breakdown   Formoterol Other (See Comments)    Other reaction(s): Headaches  Other reaction(s): Headaches  Other reaction(s): Headaches  Headaches  Other reaction(s): Headaches Other reaction(s): Headaches    Other reaction(s): Headaches    Headaches   Hydrocodone Rash   Hydrocodone-Acetaminophen Rash   Nickel Rash and Other (See Comments)   Other Rash    cast and bandage use  cast and bandage use  cast and bandage use  cast and bandage use    cast and bandage use cast and bandage use   Oxycodone-Acetaminophen Other (See Comments)    headache   Salmeterol Rash   Strawberry Extract Hives    Full body rash from strawberry   Advair Hfa [Fluticasone-Salmeterol]     unknown   Proventil Hfa [Albuterol]     unknown   Asa [Aspirin] Rash   Cleocin [Clindamycin] Rash   Clindamycin/Lincomycin Rash   Fludrocortisone Acetate Rash   Gabapentin Rash   Hydrochlorothiazide Rash   Imipramine Rash   Ipratropium Bromide Rash   Medroxyprogesterone Rash   Meloxicam Rash   Metformin And Related Rash   Nystatin Rash   Penicillins Rash   Pred Forte [Prednisolone] Rash   Prednisone Rash   Solu-Medrol [Methylprednisolone] Rash   Sulfa Antibiotics Rash   Tegaderm Ag Mesh [Silver] Rash   Toradol [Ketorolac Tromethamine] Rash   Tramadol Rash    Patient Measurements: Height: 5\' 3"  (160 cm) Weight: 99.7 kg (219 lb 14.4 oz) IBW/kg (Calculated) : 52.4 Heparin Dosing Weight: 76.5 kg  Vital Signs: Temp: 98 F (36.7 C) (12/23  0445) Temp Source: Oral (12/23 0445) BP: 151/87 (12/23 0445) Pulse Rate: 76 (12/23 0445)  Labs: Recent Labs    07/29/23 1420 07/29/23 1749 07/30/23 0741 07/30/23 1453 07/31/23 0402 07/31/23 1235 08/01/23 0401  HGB 13.6  --  12.6  --  12.1  --  11.6*  HCT 41.6  --  39.4  --  37.5  --  36.0  PLT 337  --  304  --  303  --  264  HEPARINUNFRC  --   --  0.13*   < > 0.38 0.40 0.40  CREATININE 0.78  --  0.71  --  0.72  --  0.64  TROPONINIHS 3 3  --   --   --   --   --    < > = values in this interval not displayed.    Estimated Creatinine Clearance: 90.5 mL/min (by C-G formula based on SCr of 0.64 mg/dL).   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 Atopic dermatitis Formatting of this note might be different from the original. Formatting of this note might be different from the original. Atopic dermatitis Formatting of this note might be different from the original. Formatting  of this note might be different from the original. Formatting of this note might be different from the or   Cataract 05/06/2015   Contusion of left wrist 07/08/2021   Coronary artery disease s/p PCI/DES to LAD 07/01/2023 12/28/2022   Crohn's disease (HCC) 04/21/2015   De Quervain's tenosynovitis, left 07/31/2021   Diabetes mellitus without complication (HCC)    Diabetes type 2, controlled (HCC) 04/21/2015   Dry eyes 05/06/2015   DVT (deep venous thrombosis) (HCC)    Dysfunction of both eustachian tubes 08/31/2012   Encounter for monitoring immunomodulating therapy 11/20/2021   Environmental allergies 08/12/2020   Esophageal candidiasis (HCC) 06/26/2022   Essential hypertension 06/27/2015   Fibrositis  08/28/2014   Formatting of this note might be different from the original. Fibromyalgia Formatting of this note might be different from the original. Formatting of this note might be different from the original. Fibromyalgia   Flank pain 11/24/2020   Gastroesophageal reflux disease with esophagitis without hemorrhage 08/01/2019   Formatting of this note might be different from the original. Formatting of this note might be different from the original. 07/2019: chronic symptoms of esophageal reflux since prior to sleeve gastrectomy surgery. Symptoms have worsened over the last 6 months with recent EGD findings of grade B esophagitis, irregular z line, erythematous mucosa, and evidence of sleeve gastrectomy. 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 Formatting of this note might be different from the original. 07/2019: 8 mm colonic polyp removed during colonoscopy, biopsy showed serrated polyp. Repeat colonoscopy recommended in one year. Formatting of this note might be different from the original. 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 Hypertension Formatting of this note might be different from the original. Formatting of this note might be different from the original. 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 Keratoconus Formatting of this note might be different from the original. Formatting of this note might be different from the original. Keratoconus Formatting of this note might be different from the original. Formatting of this note might be different from the original. Keratoconus Formatting of this note might be different from the or   Microhematuria 06/27/2015   Migraine 08/28/2014   Formatting of this note might be different from the original. Migraine Formatting of this note might be different from the original. Formatting of this note might be different from the original. Migraine Formatting of this note might be different from the original. Migraine Formatting of this note might be different from the original. Migraine Formatting of this note might be different from the or   Migraine without aura and without status migrainosus, not intractable 08/28/2014   Formatting of this note might be different from the original. Formatting of this note might be different from the original. Formatting of this note might be different from the  original. Migraine Formatting of this note might be different from the original. Migraine Formatting of this note might be different from the original. Formatting of this note might be different from the original. Migraine F   Mild intermittent asthma without complication 10/30/2008   Formatting of this note might be different from the original. Formatting of this note might be different from the original. Formatting of this note might be different from the original. Asthma Formatting of this note might be different from the original. Asthma Formatting of this note might be different from the original. Formatting of this note might be different from the original. 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 Formatting of this note might be different from the original. 01/25/2016 with Dr[facility] Dionisio David at [facility]. [place] of this note might be different from the original. 01/25/2016 with Dr. Dionisio David at [facility]. [place] of this note might be different from the original. Formatting of this note might be different from    Seronegative inflammatory arthritis 08/28/2014   Formatting of this note might be different from the original. Formatting of this note might be different from the original. Arthritis; diagnosed as IBD related - 2012 Formatting of this note might be different from the original. Followed by rheumatologist. Had to change providers due to an insurance change earlier this year resulting in patient not being able to have Remicade for ~6 months. 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 Formatting of this note might be different from the original. Treated with PRN miralax. Recent colonoscopy 07/2019. Formatting of this note might be different from the original. Treated with PRN miralax. Recent colonoscopy 07/2019. Formatting of this note might be different from the original. Formatting of this note might be different f   Status post placement of ureteral stent 02/06/2021   Tooth infection 08/12/2020   Unstable angina (HCC) 07/29/2023    Ureteral stone 01/20/2022   Urinary tract infection, site not specified 06/26/2022    Medications:  Medications Prior to Admission  Medication Sig Dispense Refill Last Dose/Taking   acetaminophen (TYLENOL) 500 MG tablet Take 1,000 mg by mouth every 12 (twelve) hours as needed for mild pain or moderate pain.   Past Week Morning   albuterol (PROVENTIL) (2.5 MG/3ML) 0.083% nebulizer solution Take 3 mLs (2.5 mg total) by nebulization every 6 (six) hours as needed for wheezing or shortness of breath. 150 mL 1 Past Month Morning   amLODipine (NORVASC) 5 MG tablet Take 1 tablet (5 mg total) by mouth daily. 30 tablet 2 07/29/2023 Morning   atorvastatin (LIPITOR) 80 MG tablet Take 1 tablet (80 mg total) by mouth daily. 30 tablet 2 07/29/2023 Morning   B Complex-C (B-COMPLEX WITH VITAMIN C) tablet Take 1 tablet by mouth daily.  07/29/2023 Morning   Cholecalciferol (VITAMIN D3) 50 MCG (2000 UT) TABS Take 1 tablet by mouth daily.   07/29/2023   EPINEPHrine (EPIPEN 2-PAK) 0.3 mg/0.3 mL IJ SOAJ injection Inject 0.3 mg into the muscle as needed for anaphylaxis. 2 each 2 Past Month   esomeprazole (NEXIUM) 40 MG capsule Take 1 capsule (40 mg total) by mouth 2 (two) times daily before a meal. (Patient taking differently: Take 80 mg by mouth daily.) 60 capsule 11 07/29/2023 Morning   famotidine (PEPCID) 40 MG tablet Take 1 tablet (40 mg total) by mouth daily. 30 tablet 11 07/29/2023 Morning   Golimumab (SIMPONI ARIA IV) Inject 200 mg into the vein every 8 (eight) weeks.   Past Month Morning   levocetirizine (XYZAL) 5 MG tablet Take 10 mg by mouth in the morning.   07/29/2023 Morning   linaclotide (LINZESS) 72 MCG capsule Take 144 mcg by mouth daily before breakfast.   07/29/2023 Morning   mirabegron ER (MYRBETRIQ) 50 MG TB24 tablet Take 1 tablet (50 mg total) by mouth daily. 30 tablet 11 07/29/2023 Morning   montelukast (SINGULAIR) 10 MG tablet Take 1 tablet (10 mg total) by mouth at bedtime. (Patient taking  differently: Take 10 mg by mouth daily.) 30 tablet 11 07/29/2023 Morning   nitroGLYCERIN (NITROSTAT) 0.4 MG SL tablet Place 1 tablet (0.4 mg total) under the tongue every 5 (five) minutes as needed for chest pain. 25 tablet 6 07/29/2023 Morning   OXYGEN Inhale 2 L into the lungs at bedtime.   07/28/2023 Bedtime   ticagrelor (BRILINTA) 90 MG TABS tablet Take 1 tablet (90 mg total) by mouth 2 (two) times daily. 60 tablet 5 07/29/2023 at 10:00 AM   ZOLMitriptan (ZOMIG) 2.5 MG tablet Take 1 tablet (2.5 mg total) by mouth once for 1 dose. May repeat in 2 hours if headache persists or recurs. (Patient taking differently: Take 2.5 mg by mouth daily as needed for migraine.) 10 tablet 1 Past Month   Infusions:   sodium chloride     cefTRIAXone (ROCEPHIN)  IV Stopped (07/31/23 1657)   dextrose 5 % and 0.9 % NaCl with KCl 20 mEq/L 75 mL/hr at 08/01/23 0351   heparin 1,250 Units/hr (08/01/23 0858)   nitroGLYCERIN 10 mcg/min (08/01/23 0351)    Assessment: 52 yo F presents to the ED with mid-sternal chest pain that started on 12/19 pm. Pt took nitroglycerin SL x3 overnight and continued to have chest pain. Pt is s/p PCI with DES to LAD on 07/01/23 and was initiated on ticagrelor x6 months. Pt does not take any anticoagulation at home. Pharmacy consulted to dose heparin infusion per ACS protocol.   Heparin level therapeutic at 0.40 on heparin 1250 units/hr. Hgb/plt wnl. No signs of bleeding or issues with infusion noted.   Goal of Therapy:  Heparin level 0.3-0.7 units/ml Monitor platelets by anticoagulation protocol: Yes   Plan:  Continue heparin infusion at 1250 units/hr Daily heparin level, CBC   Fredonia Highland, PharmD, [place], [facility] Clinical Pharmacist [phone] Please check AMION for all [facility] Pharmacy numbers 08/01/2023

## 2023-08-01 NOTE — Interval H&P Note (Signed)
History and Physical Interval Note:  08/01/2023 11:58 AM  Casey [name]  has presented today for surgery, with the diagnosis of unstable angina.  The various methods of treatment have been discussed with the patient and family. After consideration of risks, benefits and other options for treatment, the patient has consented to  Procedure(s): LEFT HEART CATH AND CORONARY ANGIOGRAPHY (N/A) and possible coronary intervention as a surgical intervention.  The patient's history has been reviewed, patient examined, no change in status, stable for surgery.  I have reviewed the patient's chart and labs.  Questions were answered to the patient's satisfaction.     Yates Decamp

## 2023-08-01 NOTE — TOC Initial Note (Signed)
Transition of Care [facility]) - Initial/Assessment Note    Patient Details  Name: Casey [name] MRN: [id number] Date of Birth: 05/03/1971  Transition [facility] CM/SW Contact:    Gala Lewandowsky, RN Phone Number: 08/01/2023, 3:42 PM  Clinical Narrative: Patient presented for unstable angina-post left heart cath. PTA patient was from home with spouse. Patient has PCP and insurance. No home needs identified at this time.                  Expected Discharge Plan: Home/Self Care Barriers to Discharge: No Barriers Identified  Patient Goals and CMS Choice Patient states their goals for this hospitalization and ongoing recovery are:: to return home.   Choice offered to / list presented to : NA    Expected Discharge Plan and Services   Discharge Planning Services: CM Consult Post Acute Care Choice: NA Living arrangements for the past 2 months: Single Family Home  Prior Living Arrangements/Services Living arrangements for the past 2 months: Single Family Home Lives with:: Spouse Patient language and need for interpreter reviewed:: Yes Do you feel safe going back to the place where you live?: Yes      Need for Family Participation in Patient Care: No (Comment) Care giver support system in place?: No (comment)   Criminal Activity/Legal Involvement Pertinent to Current Situation/Hospitalization: No - Comment as needed  Activities of Daily Living   ADL Screening (condition at time of admission) Independently performs ADLs?: Yes (appropriate for developmental age) Is the patient deaf or have difficulty hearing?: No Does the patient have difficulty seeing, even when wearing glasses/contacts?: No Does the patient have difficulty concentrating, remembering, or making decisions?: No  Permission Sought/Granted Permission sought to share information with : Family Supports, Case Manager   Emotional Assessment Appearance:: Appears stated age Attitude/Demeanor/Rapport:  Engaged Affect (typically observed): Appropriate Orientation: : Oriented to Self, Oriented to Place, Oriented to  Time, Oriented to Situation Alcohol / Substance Use: Not Applicable Psych Involvement: No (comment) Admission diagnosis:  Unstable angina (HCC) [I20.0] Chest pain [R07.9] Chest pain with high risk for cardiac etiology [R07.9] Patient Active Problem List   Diagnosis Date Noted   Unstable angina (HCC) 07/29/2023   Moderate persistent asthma 07/29/2023   History of DVT (deep vein thrombosis) 06/30/2023   Chest pain, rule out acute myocardial infarction 06/29/2023   Nocturnal hypoxemia 06/13/2023   Oropharyngeal candidiasis 01/02/2023   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   Coronary artery disease s/p PCI/DES to LAD 07/01/2023 12/28/2022   Precordial chest pain 10/13/2022   Hyperlipidemia 10/07/2022   Hiatal hernia 10/07/2022   Esophageal candidiasis (HCC) 06/26/2022   Urinary tract infection, site not specified 06/26/2022   Ureteral stone 01/20/2022   Encounter for monitoring immunomodulating therapy 11/20/2021   Rheumatoid arthritis of multiple sites with 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 factor (HCC) 11/16/2021   Pyelonephritis 09/25/2021   De Quervain's tenosynovitis, left 07/31/2021   Contusion of left wrist 07/08/2021   Status post placement of ureteral stent 02/06/2021   Proteinuria 08/12/2020   Environmental allergies 08/12/2020   History of kidney stones 08/12/2020   Tooth infection 08/12/2020   Gastroesophageal reflux disease with esophagitis without hemorrhage 08/01/2019   Slow transit constipation 08/01/2019   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   History of colon polyps 06/19/2019   Irregular astigmatism of both eyes 10/11/2018   Nephrolithiasis 09/08/2018   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   Plantar wart 11/04/2016   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   Myopia with  astigmatism and presbyopia 05/07/2016   Hematochezia  03/18/2016   Overactive bladder 11/18/2015   Incontinence 10/07/2015   Albuminuria 06/27/2015   Essential hypertension 06/27/2015   Microhematuria 06/27/2015   Hematuria 06/18/2015   Cataract 05/06/2015   Dry eyes 05/06/2015   Crohn's disease (HCC) 04/21/2015   Osteoarthritis of both knees 04/21/2015   Right knee pain 04/21/2015   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/30/2014   History of abnormal cervical Pap smear 11/29/2014   Fibrositis 08/28/2014   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   Migraine without aura and without status migrainosus, not intractable 08/28/2014   Migraine 08/28/2014   Seronegative inflammatory arthritis 08/28/2014   Increased urinary frequency 07/16/2013   Dysfunction of both eustachian tubes 08/31/2012   Asthma 10/30/2008   Mild intermittent asthma without complication 10/30/2008   PCP:  Dominic Pea, PA-C Pharmacy:   [facility] [address] [place] - [address] [facility] [place] [address] Phone: [phone] Fax: [phone]  [facility] [address] [place] [address] Phone: [phone] Fax: [phone]  [facility] [address] [place] [address] Phone: [phone] Fax: [phone]  Social Drivers of Health (SDOH) Social History: SDOH Screenings   Food Insecurity: No Food Insecurity (07/30/2023)  Housing: Low Risk  (07/30/2023)  Transportation Needs: No Transportation Needs (07/30/2023)  Utilities: Not At Risk (07/30/2023)  Alcohol Screen: Low Risk  (07/14/2023)  Depression (PHQ2-9): Medium Risk (07/21/2023)  Financial Resource Strain: High Risk (07/14/2023)  Physical Activity: Unknown (07/14/2023)  Social Connections: Moderately Isolated (07/14/2023)  Stress: No Stress Concern Present (07/14/2023)  Tobacco Use: Low Risk  (07/30/2023)   Readmission Risk Interventions     No data to display

## 2023-08-02 ENCOUNTER — Telehealth (HOSPITAL_COMMUNITY): Payer: Self-pay | Admitting: *Deleted

## 2023-08-02 ENCOUNTER — Encounter (HOSPITAL_COMMUNITY): Payer: Self-pay | Admitting: Cardiology

## 2023-08-02 ENCOUNTER — Ambulatory Visit: Payer: 59 | Admitting: Cardiology

## 2023-08-02 DIAGNOSIS — I10 Essential (primary) hypertension: Secondary | ICD-10-CM | POA: Diagnosis not present

## 2023-08-02 DIAGNOSIS — I25118 Atherosclerotic heart disease of native coronary artery with other forms of angina pectoris: Secondary | ICD-10-CM | POA: Diagnosis not present

## 2023-08-02 DIAGNOSIS — I3139 Other pericardial effusion (noninflammatory): Secondary | ICD-10-CM | POA: Diagnosis not present

## 2023-08-02 DIAGNOSIS — M0609 Rheumatoid arthritis without rheumatoid factor, multiple sites: Secondary | ICD-10-CM | POA: Diagnosis not present

## 2023-08-02 DIAGNOSIS — I200 Unstable angina: Secondary | ICD-10-CM | POA: Diagnosis not present

## 2023-08-02 LAB — CBC
HCT: 37.7 % (ref 36.0–46.0)
Hemoglobin: 12.1 g/dL (ref 12.0–15.0)
MCH: 26.4 pg (ref 26.0–34.0)
MCHC: 32.1 g/dL (ref 30.0–36.0)
MCV: 82.1 fL (ref 80.0–100.0)
Platelets: 293 10*3/uL (ref 150–400)
RBC: 4.59 MIL/uL (ref 3.87–5.11)
RDW: 17.5 % — ABNORMAL HIGH (ref 11.5–15.5)
WBC: 10.4 10*3/uL (ref 4.0–10.5)
nRBC: 0 % (ref 0.0–0.2)

## 2023-08-02 LAB — BASIC METABOLIC PANEL
Anion gap: 8 (ref 5–15)
BUN: <5 mg/dL — ABNORMAL LOW (ref 6–20)
CO2: 23 mmol/L (ref 22–32)
Calcium: 9.1 mg/dL (ref 8.9–10.3)
Chloride: 107 mmol/L (ref 98–111)
Creatinine, Ser: 0.71 mg/dL (ref 0.44–1.00)
GFR, Estimated: >60 mL/min (ref >60)
Glucose, Bld: 108 mg/dL — ABNORMAL HIGH (ref 70–99)
Potassium: 3.5 mmol/L (ref 3.5–5.1)
Sodium: 138 mmol/L (ref 135–145)

## 2023-08-02 NOTE — Progress Notes (Signed)
Discharge instructions given. Patient verbalized understanding and all questions were answered.  ?

## 2023-08-02 NOTE — Progress Notes (Addendum)
Patient Name: Casey [name] Date of Encounter: 08/02/2023 [facility] Cardiologist: Gypsy Balsam, MD   Interval Summary  .    Patient is doing well, siting up in bed. She says she had some mild chest pain and shortness of breath after walking the length of the hallway yesterday but she has no symptoms at rest. We discussed her cath results, that she was already aware of. She is now focused on her RA and ensuring that she does not have any flares while in the hospital. She states her joint pain ranges from 3-6/10 depending on if she is walking around or sitting for extended periods of time. Will restart home amlodipine dose for further BP control   Vital Signs .    Vitals:   08/01/23 1237 08/01/23 1500 08/01/23 2018 08/02/23 0637  BP: (!) 152/79 (!) 152/91 (!) 159/100 (!) 136/90  Pulse: 76 88 89 81  Resp: 17 18 18 19   Temp:  98.4 F (36.9 C) 98.4 F (36.9 C) 98 F (36.7 C)  TempSrc:  Oral Oral Oral  SpO2:   95% 95%  Weight:      Height:        Intake/Output Summary (Last 24 hours) at 08/02/2023 0929 Last data filed at 08/02/2023 0849 Gross per 24 hour  Intake 360 ml  Output --  Net 360 ml      07/30/2023    6:00 PM 07/29/2023    1:30 PM 07/21/2023    9:54 AM  Last 3 Weights  Weight (lbs) 219 lb 14.4 oz 225 lb 221 lb 9 oz  Weight (kg) 99.746 kg 102.059 kg 100.5 kg      Telemetry/ECG    Sinus rhythm - Personally Reviewed  Physical Exam .   GEN: No acute distress.   Neck: No JVD Cardiac: RRR, no murmurs, rubs, or gallops.  Respiratory: Clear to auscultation bilaterally. GI: Soft, nontender, non-distended  MS: No edema  Assessment & Plan .     Chest Pain Unstable Angina CAD s/p DES to LAD 07/01/2023 Patient reported recurrent unstable anginal symptoms 12/19 where she took sublingual nitroglycerin with some transient relief.  Negative troponin x 2. EKG without significant ischemic changes  Echo from 12/22 showed LVEF 60-65%, normal LV function,  no regional wall abnormalities, normal RV size and systolic function, small pericardial effusion, normal IVC LHC done 12/23 showed RPAV lesion is 40% stenosed. Prox Cx to Mid Cx lesion is 20% stenosed. Widely patent stent in the mid LAD, no significant change in coronary anatomy otherwise  -- Continue Brilinta 90 mg BID   Hypertension -- Will restart home dose of amlodipine 5 mg daily for BP control and anti-anginal benefit upon discharge    Hyperlipidemia -- Continue atorvostatin 80 mg    Hypokalemia -- Resolving with IV repletion per primary team, most recent value 3.8   Moderate persistent asthma -- Continue Singuilar 10 mg and albuterol nebulizer PRN per primary team   Rheumatoid Arthritis Patient notes that she usually experiences flares with her RA during hospitalizations/procedures.  She is due for her next infusion of Simponi on 11/13. She denies any joint pain at this time -- Small pericardial effusion seen on echo from 12/22 -- CRP normal 0.8, Sed rate elevated at 40, WBC elevated at 14.1  -- Patient transferring care from [facility] to a more local rheumatologist and will continue to be followed up outpatient    Urinary Tract Infection Patient reports not completing outpatient treatment for UTI --  Managed by primary team   For questions or updates, please contact [facility] Please consult [email or web address] for contact info under    Signed, Olena Leatherwood, PA-C    Patient seen and examined. Agree with assessment and plan.  No recurrent chest pain.  Catheterization data reviewed.  Mildly patent mid LAD stent.  No significant change in additional coronary anatomy.  Continue DAPT.  Sed rate elevated.  May need additional treatment for rheumatoid arthritis inflammatory response.  Patient had small pericardial effusion noted on echo on December 22.  Would recommend follow-up echo as outpatient for reassessment of small effusion.  Cardiac stable for discharge today.  Plan  to see Dr. Bing Matter for follow-up Cardiologic evaluation.   Lennette Bihari, MD, [facility] 08/02/2023 9:51 AM

## 2023-08-02 NOTE — Care Management Important Message (Signed)
Important Message  Patient Details  Name: [name] MRN: [id number] Date of Birth: [birth year]   Important Message Given:  Yes - Medicare IM     Renie Ora 08/02/2023, 10:32 AM

## 2023-08-02 NOTE — Discharge Summary (Signed)
Physician Discharge Summary  Casey [name] WGN:[id number] DOB: Mar 06, 1971 DOA: 07/29/2023  PCP: Dominic Pea, PA-C  Admit date: 07/29/2023 Discharge date: 08/02/2023  Admitted From: Home Disposition: Home  Recommendations for Outpatient Follow-up:  Follow up with PCP in 1-2 weeks Cardiology to schedule follow-up Follow-up with your rheumatologist  Home Health: N/A Equipment/Devices: N/A  Discharge Condition: Stable CODE STATUS: Full code Diet recommendation: Low-salt diet  Discharge summary: 52 year old with history of rheumatoid arthritis, coronary artery disease with recent LAD stenting on 07/01/2023, moderate persistent asthma, hypertension, hyperlipidemia, history of DVT but not currently on anticoagulation presented to the emergency room with chest pressure sensation. Due to recent cardiac stents, she was admitted to the hospital and treated with heparin infusion and nitroglycerin infusion with clinical improvement.  Patient underwent cardiac cath that showed patent LAD stent and no other significant coronary disease.  She likely has musculoskeletal pain.  Currently stable.  Able to go home with continuing Cardizem, Brilinta and atorvastatin.  Outpatient follow-up with cardiology.  She will follow-up with her rheumatologist about her rheumatoid arthritis.  She takes Simponi.  Suspected UTI, cultures with 20,000 E. coli.  Received 2 days of Rocephin and 2 doses of Diflucan.  No urinary symptoms.  No indication for further antibiotics.  Stable for discharge.   Discharge Diagnoses:  Principal Problem:   Unstable angina [facility]) Active Problems:   Essential hypertension   Coronary artery disease s/p PCI/DES to LAD 07/01/2023   Rheumatoid arthritis of multiple sites with 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 factor (HCC)   Hyperlipidemia   Moderate persistent asthma    Discharge Instructions  Discharge Instructions     Diet - low sodium heart healthy   Complete by: As directed     Increase activity slowly   Complete by: As directed       Allergies as of 08/02/2023       Reactions   Albuterol Sulfate Shortness Of Breath   Other reaction(s): Other (See Comments)  can't breathe Other reaction(s): Other (See Comments) can't breathe   Tape    Other reaction(s): Other (See Comments), Other (See Comments), Other (See Comments)  severe skin breakdown  Cast and Bandage Cover  severe skin breakdown  severe skin breakdown   Formoterol Other (See Comments)   Other reaction(s): Headaches  Other reaction(s): Headaches Other reaction(s): Headaches Headaches Other reaction(s): Headaches Other reaction(s): Headaches    Other reaction(s): Headaches    Headaches   Hydrocodone Rash   Hydrocodone-acetaminophen Rash   Nickel Rash, Other (See Comments)   Other Rash   cast and bandage use cast and bandage use  cast and bandage use cast and bandage use    cast and bandage use cast and bandage use   Oxycodone-acetaminophen Other (See Comments)   headache   Salmeterol Rash   Strawberry Extract Hives   Full body rash from strawberry   Advair Hfa [fluticasone-salmeterol]    unknown   Proventil Hfa [albuterol]    unknown   Asa [aspirin] Rash   Cleocin [clindamycin] Rash   Clindamycin/lincomycin Rash   Fludrocortisone Acetate Rash   Gabapentin Rash   Hydrochlorothiazide Rash   Imipramine Rash   Ipratropium Bromide Rash   Medroxyprogesterone Rash   Meloxicam Rash   Metformin And Related Rash   Nystatin Rash   Penicillins Rash   Pred Forte [prednisolone] Rash   Prednisone Rash   Solu-medrol [methylprednisolone] Rash   Sulfa Antibiotics Rash   Tegaderm Ag Mesh [silver] Rash   Toradol [ketorolac Tromethamine] Rash  Tramadol Rash        Medication List     TAKE these medications    acetaminophen 500 MG tablet Commonly known as: TYLENOL Take 1,000 mg by mouth every 12 (twelve) hours as needed for mild pain or moderate pain.   albuterol (2.5 MG/3ML)  0.083% nebulizer solution Commonly known as: PROVENTIL Take 3 mLs (2.5 mg total) by nebulization every 6 (six) hours as needed for wheezing or shortness of breath.   amLODipine 5 MG tablet Commonly known as: NORVASC Take 1 tablet (5 mg total) by mouth daily.   atorvastatin 80 MG tablet Commonly known as: LIPITOR Take 1 tablet (80 mg total) by mouth daily.   B-complex with vitamin C tablet Take 1 tablet by mouth daily.   EPINEPHrine 0.3 mg/0.3 mL Soaj injection Commonly known as: EpiPen 2-Pak Inject 0.3 mg into the muscle as needed for anaphylaxis.   esomeprazole 40 MG capsule Commonly known as: NEXIUM Take 1 capsule (40 mg total) by mouth 2 (two) times daily before a meal. What changed:  how much to take when to take this   famotidine 40 MG tablet Commonly known as: PEPCID Take 1 tablet (40 mg total) by mouth daily.   levocetirizine 5 MG tablet Commonly known as: XYZAL Take 10 mg by mouth in the morning.   linaclotide 72 MCG capsule Commonly known as: LINZESS Take 144 mcg by mouth daily before breakfast.   mirabegron ER 50 MG Tb24 tablet Commonly known as: Myrbetriq Take 1 tablet (50 mg total) by mouth daily.   montelukast 10 MG tablet Commonly known as: SINGULAIR Take 1 tablet (10 mg total) by mouth at bedtime. What changed: when to take this   nitroGLYCERIN 0.4 MG SL tablet Commonly known as: NITROSTAT Place 1 tablet (0.4 mg total) under the tongue every 5 (five) minutes as needed for chest pain.   OXYGEN Inhale 2 L into the lungs at bedtime.   SIMPONI ARIA IV Inject 200 mg into the vein every 8 (eight) weeks.   ticagrelor 90 MG Tabs tablet Commonly known as: BRILINTA Take 1 tablet (90 mg total) by mouth 2 (two) times daily.   Vitamin D3 50 MCG (2000 UT) Tabs Take 1 tablet by mouth daily.   ZOLMitriptan 2.5 MG tablet Commonly known as: Zomig Take 1 tablet (2.5 mg total) by mouth once for 1 dose. May repeat in 2 hours if headache persists or  recurs. What changed:  when to take this reasons to take this additional instructions        Allergies  Allergen Reactions   Albuterol Sulfate Shortness Of Breath    Other reaction(s): Other (See Comments)  can't breathe  Other reaction(s): Other (See Comments) can't breathe   Tape     Other reaction(s): Other (See Comments), Other (See Comments), Other (See Comments)  severe skin breakdown  Cast and Bandage Cover  severe skin breakdown  severe skin breakdown   Formoterol Other (See Comments)    Other reaction(s): Headaches  Other reaction(s): Headaches  Other reaction(s): Headaches  Headaches  Other reaction(s): Headaches Other reaction(s): Headaches    Other reaction(s): Headaches    Headaches   Hydrocodone Rash   Hydrocodone-Acetaminophen Rash   Nickel Rash and Other (See Comments)   Other Rash    cast and bandage use  cast and bandage use  cast and bandage use  cast and bandage use    cast and bandage use cast and bandage use   Oxycodone-Acetaminophen Other (See Comments)  headache   Salmeterol Rash   Strawberry Extract Hives    Full body rash from strawberry   Advair Hfa [Fluticasone-Salmeterol]     unknown   Proventil Hfa [Albuterol]     unknown   Asa [Aspirin] Rash   Cleocin [Clindamycin] Rash   Clindamycin/Lincomycin Rash   Fludrocortisone Acetate Rash   Gabapentin Rash   Hydrochlorothiazide Rash   Imipramine Rash   Ipratropium Bromide Rash   Medroxyprogesterone Rash   Meloxicam Rash   Metformin And Related Rash   Nystatin Rash   Penicillins Rash   Pred Forte [Prednisolone] Rash   Prednisone Rash   Solu-Medrol [Methylprednisolone] Rash   Sulfa Antibiotics Rash   Tegaderm Ag Mesh [Silver] Rash   Toradol [Ketorolac Tromethamine] Rash   Tramadol Rash    Consultations: Cardiology   Procedures/Studies: CARDIAC CATHETERIZATION Result Date: 08/01/2023 Images from the original result were not included.   RPAV lesion is 40%  stenosed.   Prox Cx to Mid Cx lesion is 20% stenosed. Left Heart Catheterization 08/01/23: Hemodynamic data: LV: 139/-2, EDP 15 mmHg.  Ao 127/78, mean 99 mmHg.  No pressure gradient across the aortic valve. Angiographic data: LV: Normal LV systolic function, EF 55 to 60%.  No regional wall motion abnormality in the RAO projection. LM: Large-caliber vessel.  It is smooth and normal. LAD: Gives origin to large D1.  Just after the D1, previously placed Synergy 3.0 x 20 mm stent on 08/30/2022 is widely patent.  There is mild disease in the mid to distal LAD and apical LAD. LCx: Gives origin to a very small OM1, large OM 2 and continues in the AV groove.  There is mild disease noted in the proximal segment. RCA: Large-caliber vessel nondominant vessel.  Gives origin to large PDA and a larger PL branch which has ostial 40% stenosis.  RCA itself is mildly diffusely diseased. Impression and recommendations: Widely patent stent in the mid LAD, no significant change in coronary anatomy otherwise.  Continue aggressive risk modification and secondary prevention.  In the absence of EKG changes, negative troponin leak, evaluate for noncardiac causes of chest pain versus coronary spasm.   ECHOCARDIOGRAM LIMITED Result Date: 07/31/2023    ECHOCARDIOGRAM LIMITED REPORT   Patient Name:   Casey [name] Date of Exam: 07/31/2023 Medical Rec #:  [id number]    Height:       63.0 in Accession #:    [id number]   Weight:       219.9 lb Date of Birth:  [birth year]    BSA:          2.013 m Patient Age:    52 years     BP:           162/107 mmHg Patient Gender: F            HR:           82 bpm. Exam Location:  Inpatient Procedure: Limited Echo and Cardiac Doppler Indications:    Pericardial Effusion l31.3  History:        Patient has prior history of Echocardiogram examinations, most                 recent 06/24/2023. Risk Factors:Dyslipidemia, Diabetes,                 Hypertension and Non-Smoker. Morbid obesity.  Sonographer:    Celesta Gentile RCS Referring Phys: [id number] [facility] A CHANDRASEKHAR IMPRESSIONS  1. Left ventricular ejection fraction, by estimation, is 60 to  65%. The left ventricle has normal function. The left ventricle has no regional wall motion abnormalities.  2. Right ventricular systolic function is normal. The right ventricular size is normal.  3. A small pericardial effusion is present.  4. The inferior vena cava is normal in size with greater than 50% respiratory variability, suggesting right atrial pressure of 3 mmHg. FINDINGS  Left Ventricle: Left ventricular ejection fraction, by estimation, is 60 to 65%. The left ventricle has normal function. The left ventricle has no regional wall motion abnormalities. Right Ventricle: The right ventricular size is normal. No increase in right ventricular wall thickness. Right ventricular systolic function is normal. Pericardium: A small pericardial effusion is present. Venous: The inferior vena cava is normal in size with greater than 50% respiratory variability, suggesting right atrial pressure of 3 mmHg. Epifanio Lesches MD Electronically signed by Epifanio Lesches MD Signature Date/Time: 07/31/2023/3:34:17 PM    Final    DG Chest 2 View Result Date: 07/29/2023 CLINICAL DATA:  Chest pain. EXAM: CHEST - 2 VIEW COMPARISON:  Chest radiograph dated June 29, 2023. FINDINGS: Low lung volume. The heart size and mediastinal contours are within normal limits. No focal consolidation, pleural effusion, or pneumothorax. No acute osseous abnormality. IMPRESSION: Low lung volumes.  No acute cardiopulmonary findings. Electronically Signed   By: Hart Robinsons M.D.   On: 07/29/2023 16:19   (Echo, Carotid, EGD, Colonoscopy, ERCP)    Subjective: Patient seen and examined.  Occasional mild midsternal pain but no palpitations or shortness of breath.   Discharge Exam: Vitals:   08/01/23 2018 08/02/23 0637  BP: (!) 159/100 (!) 136/90  Pulse: 89 81  Resp: 18 19  Temp: 98.4 F  (36.9 C) 98 F (36.7 C)  SpO2: 95% 95%   Vitals:   08/01/23 1237 08/01/23 1500 08/01/23 2018 08/02/23 0637  BP: (!) 152/79 (!) 152/91 (!) 159/100 (!) 136/90  Pulse: 76 88 89 81  Resp: 17 18 18 19   Temp:  98.4 F (36.9 C) 98.4 F (36.9 C) 98 F (36.7 C)  TempSrc:  Oral Oral Oral  SpO2:   95% 95%  Weight:      Height:        General: Pt is alert, awake, not in acute distress Cardiovascular: RRR, S1/S2 +, no rubs, no gallops Respiratory: CTA bilaterally, no wheezing, no rhonchi Abdominal: Soft, NT, ND, bowel sounds + Extremities: no edema, no cyanosis    The results of significant diagnostics from this hospitalization (including imaging, microbiology, ancillary and laboratory) are listed below for reference.     Microbiology: Recent Results (from the past 240 hours)  Urine Culture (for pregnant, neutropenic or urologic patients or patients with an indwelling urinary catheter)     Status: Abnormal   Collection Time: 07/30/23  8:02 AM   Specimen: Urine, Clean Catch  Result Value Ref Range Status   Specimen Description URINE, CLEAN CATCH  Final   Special Requests   Final    NONE Performed at [facility], [address] [place], [place] [address]    Culture 20,000 COLONIES/mL ESCHERICHIA COLI (A)  Final   Report Status 08/01/2023 FINAL  Final   Organism ID, Bacteria ESCHERICHIA COLI (A)  Final      Susceptibility   Escherichia coli - MIC*    AMPICILLIN >=32 RESISTANT Resistant     CEFAZOLIN <=4 SENSITIVE Sensitive     CEFEPIME <=0.12 SENSITIVE Sensitive     CEFTRIAXONE <=0.25 SENSITIVE Sensitive     CIPROFLOXACIN <=0.25  SENSITIVE Sensitive     GENTAMICIN <=1 SENSITIVE Sensitive     IMIPENEM <=0.25 SENSITIVE Sensitive     NITROFURANTOIN <=16 SENSITIVE Sensitive     TRIMETH/SULFA <=20 SENSITIVE Sensitive     AMPICILLIN/SULBACTAM >=32 RESISTANT Resistant     PIP/TAZO 8 SENSITIVE Sensitive ug/mL    * 20,000 COLONIES/mL ESCHERICHIA COLI     Labs: BNP (last 3  results) No results for input(s): "BNP" in the last 8760 hours. Basic Metabolic Panel: Recent Labs  Lab 07/29/23 1420 07/30/23 0741 07/31/23 0402 08/01/23 0401 08/02/23 0357  NA 139 139 140 137 138  K 3.5 3.4* 3.6 3.8 3.5  CL 103 104 107 108 107  CO2 25 22 23  20* 23  GLUCOSE 111* 117* 120* 119* 108*  BUN 9 9 10 7  <5*  CREATININE 0.78 0.71 0.72 0.64 0.71  CALCIUM 9.4 9.2 9.1 8.9 9.1  MG  --   --  1.9  --   --    Liver Function Tests: No results for input(s): "AST", "ALT", "ALKPHOS", "BILITOT", "PROT", "ALBUMIN" in the last 168 hours. No results for input(s): "LIPASE", "AMYLASE" in the last 168 hours. No results for input(s): "AMMONIA" in the last 168 hours. CBC: Recent Labs  Lab 07/29/23 1420 07/30/23 0741 07/31/23 0402 08/01/23 0401 08/02/23 0357  WBC 10.1 10.8* 11.8* 14.1* 10.4  HGB 13.6 12.6 12.1 11.6* 12.1  HCT 41.6 39.4 37.5 36.0 37.7  MCV 81.7 80.9 81.7 82.4 82.1  PLT 337 304 303 264 293   Cardiac Enzymes: No results for input(s): "CKTOTAL", "CKMB", "CKMBINDEX", "TROPONINI" in the last 168 hours. BNP: Invalid input(s): "POCBNP" CBG: No results for input(s): "GLUCAP" in the last 168 hours. D-Dimer No results for input(s): "DDIMER" in the last 72 hours. Hgb A1c No results for input(s): "HGBA1C" in the last 72 hours. Lipid Profile No results for input(s): "CHOL", "HDL", "LDLCALC", "TRIG", "CHOLHDL", "LDLDIRECT" in the last 72 hours. Thyroid function studies No results for input(s): "TSH", "T4TOTAL", "T3FREE", "THYROIDAB" in the last 72 hours.  Invalid input(s): "FREET3" Anemia work up No results for input(s): "VITAMINB12", "FOLATE", "FERRITIN", "TIBC", "IRON", "RETICCTPCT" in the last 72 hours. Urinalysis    Component Value Date/Time   COLORURINE AMBER (A) 07/30/2023 0855   APPEARANCEUR CLOUDY (A) 07/30/2023 0855   LABSPEC 1.020 07/30/2023 0855   PHURINE 5.0 07/30/2023 0855   GLUCOSEU NEGATIVE 07/30/2023 0855   HGBUR NEGATIVE 07/30/2023 0855    BILIRUBINUR NEGATIVE 07/30/2023 0855   BILIRUBINUR Small 07/14/2023 1335   KETONESUR NEGATIVE 07/30/2023 0855   PROTEINUR 30 (A) 07/30/2023 0855   UROBILINOGEN 0.2 07/14/2023 1335   NITRITE NEGATIVE 07/30/2023 0855   LEUKOCYTESUR NEGATIVE 07/30/2023 0855   Sepsis Labs Recent Labs  Lab 07/30/23 0741 07/31/23 0402 08/01/23 0401 08/02/23 0357  WBC 10.8* 11.8* 14.1* 10.4   Microbiology Recent Results (from the past 240 hours)  Urine Culture (for pregnant, neutropenic or urologic patients or patients with an indwelling urinary catheter)     Status: Abnormal   Collection Time: 07/30/23  8:02 AM   Specimen: Urine, Clean Catch  Result Value Ref Range Status   Specimen Description URINE, CLEAN CATCH  Final   Special Requests   Final    NONE Performed at [facility], [address] [place], [place] [address]    Culture 20,000 COLONIES/mL ESCHERICHIA COLI (A)  Final   Report Status 08/01/2023 FINAL  Final   Organism ID, Bacteria ESCHERICHIA COLI (A)  Final      Susceptibility  Escherichia coli - MIC*    AMPICILLIN >=32 RESISTANT Resistant     CEFAZOLIN <=4 SENSITIVE Sensitive     CEFEPIME <=0.12 SENSITIVE Sensitive     CEFTRIAXONE <=0.25 SENSITIVE Sensitive     CIPROFLOXACIN <=0.25 SENSITIVE Sensitive     GENTAMICIN <=1 SENSITIVE Sensitive     IMIPENEM <=0.25 SENSITIVE Sensitive     NITROFURANTOIN <=16 SENSITIVE Sensitive     TRIMETH/SULFA <=20 SENSITIVE Sensitive     AMPICILLIN/SULBACTAM >=32 RESISTANT Resistant     PIP/TAZO 8 SENSITIVE Sensitive ug/mL    * 20,000 COLONIES/mL ESCHERICHIA COLI     Time coordinating discharge: 32 minutes  SIGNED:   Dorcas Carrow, MD  [facility] 08/02/2023, 9:23 AM

## 2023-08-02 NOTE — Telephone Encounter (Signed)
Received departmental voice mail for Cardiac rehab.  Pt advising she was in the hospital and is now discharged. Called pt. Pt has follow up on 1/30.  Advised pt not to return until seen and cleared to resume Cardiac Rehab.  Verbalized understanding. Alanson Aly, BSN Cardiac and Emergency planning/management officer

## 2023-08-05 ENCOUNTER — Encounter (HOSPITAL_COMMUNITY): Payer: 59

## 2023-08-08 ENCOUNTER — Encounter (HOSPITAL_COMMUNITY): Admission: RE | Admit: 2023-08-08 | Payer: 59 | Source: Ambulatory Visit

## 2023-08-08 ENCOUNTER — Encounter: Payer: Self-pay | Admitting: Cardiology

## 2023-08-08 ENCOUNTER — Ambulatory Visit: Payer: 59 | Attending: Cardiology | Admitting: Cardiology

## 2023-08-08 ENCOUNTER — Telehealth (HOSPITAL_COMMUNITY): Payer: Self-pay | Admitting: *Deleted

## 2023-08-08 VITALS — BP 126/90 | HR 84 | Ht 63.0 in | Wt 218.0 lb

## 2023-08-08 DIAGNOSIS — I25118 Atherosclerotic heart disease of native coronary artery with other forms of angina pectoris: Secondary | ICD-10-CM

## 2023-08-08 DIAGNOSIS — M1990 Unspecified osteoarthritis, unspecified site: Secondary | ICD-10-CM

## 2023-08-08 DIAGNOSIS — I3131 Malignant pericardial effusion in diseases classified elsewhere: Secondary | ICD-10-CM

## 2023-08-08 DIAGNOSIS — I10 Essential (primary) hypertension: Secondary | ICD-10-CM | POA: Diagnosis not present

## 2023-08-08 DIAGNOSIS — M0609 Rheumatoid arthritis without rheumatoid factor, multiple sites: Secondary | ICD-10-CM

## 2023-08-08 MED ORDER — AMLODIPINE BESYLATE 10 MG PO TABS: 10.0000 mg | ORAL_TABLET | Freq: Every day | ORAL | 3 refills | Status: DC

## 2023-08-08 NOTE — Progress Notes (Signed)
Cardiology Office Note:    Date:  08/08/2023   ID:  Casey [name], DOB Dec 21, 1970, MRN [id number]  PCP:  Dominic Pea, PA-C  Cardiologist:  Gypsy Balsam, MD    Referring MD: Olive Bass,*   Chief Complaint  Patient presents with   Hospitalization Follow-up    History of Present Illness:    Casey [name] is a 52 y.o. female complex past medical history which include essential hypertension, dyslipidemia, rheumatoid arthritis on biological Mary agent, type 2 diabetes status post laparoscopic sleeve gastric surgery done years ago she was referred to [place] initially because of chest pain, coronary CT angio showed moderate stenosis of the proximal ID with fraction flow reserve negative, that was done in March, then in November she presented with worsening of chest pain.  Cardiac catheterization has been done which showed severe stenosis of the proximal LAD, that being addressed with stenting.  She was put on dual antiplatelets therapy however months later she ended up coming back to the cardiac Cath Lab because of recurrence of chest pain this time stent site was perfectly open there were residual hemodynamically insignificant lesions in different arteries but no intervention has been needed also at that time she had echocardiogram done which showed small amount of pericardial effusion without tamponade.  She comes today to my office complaining of having chest pain she actually goes to cardiac rehab but she could not do it because of chest pain at this exact same pain that she got before she went for second cardiac catheterization initial impression was that we may do stress testing however since she just had a cardiac catheterization literally few days ago which showed no obstructive disease I think he will be try to manage her medically.  Also she does have rheumatoid arthritis and that may be acting up diabetes which was possible for pericardial fusion and pain that she described.  She  does have nitroglycerin I we will give her a higher dose of amlodipine she takes 5 mg we will go to 10 to see if I can suppress her symptoms if it comes from coronary arteries small disease small vessel disease.  However can recent cardiac cath did not show any obstructive lesions  Past Medical History:  Diagnosis Date   Albuminuria 06/27/2015   Anxiety 11/04/2014   Arm DVT (deep venous thromboembolism), acute, left (HCC) 08/17/2022   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/30/2014   Asthma    Atopic dermatitis 10/30/2008   Formatting of this note might be different from the original. Atopic dermatitis Formatting of this note might be different from the original. Formatting of this note might be different from the original. Atopic dermatitis Formatting of this note might be different from the original. Formatting of this note might be different from the original. Formatting of this note might be different from the or   Cataract 05/06/2015   Contusion of left wrist 07/08/2021   Coronary artery disease s/p PCI/DES to LAD 07/01/2023 12/28/2022   Crohn's disease (HCC) 04/21/2015   De Quervain's tenosynovitis, left 07/31/2021   Diabetes mellitus without complication (HCC)    Diabetes type 2, controlled (HCC) 04/21/2015   Dry eyes 05/06/2015   DVT (deep venous thrombosis) (HCC)    Dysfunction of both eustachian tubes 08/31/2012   Encounter for monitoring immunomodulating therapy 11/20/2021   Environmental allergies 08/12/2020   Esophageal candidiasis (HCC) 06/26/2022   Essential hypertension 06/27/2015   Fibrositis 08/28/2014   Formatting of this note might  be different from the original. Fibromyalgia Formatting of this note might be different from the original. Formatting of this note might be different from the original. Fibromyalgia   Flank pain 11/24/2020   Gastroesophageal reflux disease with esophagitis without hemorrhage 08/01/2019   Formatting of this note might be  different from the original. Formatting of this note might be different from the original. 07/2019: chronic symptoms of esophageal reflux since prior to sleeve gastrectomy surgery. Symptoms have worsened over the last 6 months with recent EGD findings of grade B esophagitis, irregular z line, erythematous mucosa, and evidence of sleeve gastrectomy. 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 Formatting of this note might be different from the original. 07/2019: 8 mm colonic polyp removed during colonoscopy, biopsy showed serrated polyp. Repeat colonoscopy recommended in one year. Formatting of this note might be different from the original. Added automatically from request for surgery [phone] Formatting of this note might b   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   History of kidney stones 08/12/2020   Hypercholesterolemia 10/07/2022   Hypertension, essential, benign 06/27/2015   Hypertensive disorder 08/28/2014   Formatting of this note might be different from the original. Hypertension Formatting of this note might be different from the original. Formatting of this note might be different from the original. 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 Keratoconus Formatting of this note might be different from the original. Formatting of this note might be different from the original. Keratoconus Formatting of this note might be different from the original. Formatting of this note might be different from the original. Keratoconus Formatting of this note might be different from the or    Microhematuria 06/27/2015   Migraine 08/28/2014   Formatting of this note might be different from the original. Migraine Formatting of this note might be different from the original. Formatting of this note might be different from the original. Migraine Formatting of this note might be different from the original. Migraine Formatting of this note might be different from the original. Migraine Formatting of this note might be different from the or   Migraine without aura and without status migrainosus, not intractable 08/28/2014   Formatting of this note might be different from the original. Formatting of this note might be different from the original. Formatting of this note might be different from the original. Migraine Formatting of this note might be different from the original. Migraine Formatting of this note might be different from the original. Formatting of this note might be different from the original. Migraine F   Mild intermittent asthma without complication 10/30/2008   Formatting of this note might be different from the original. Formatting of this note might be different from the original. Formatting of this note might be different from the original. Asthma Formatting of this note might be different from the original. Asthma Formatting of this note might be different from the original. Formatting of this note might be different from the original. 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 Formatting of this note might be different from the original. 01/25/2016 with Dr. Dionisio David at [facility] [place] of this note might be different from the original. 01/25/2016 with Dr. Dionisio David at [facility] [place] of this note might be different from the original. Formatting of this note might be different from    Seronegative inflammatory arthritis 08/28/2014   Formatting of this note might be different from the original. Formatting of this note might be different from the original. Arthritis; diagnosed as IBD related - 2012 Formatting of this note might be different from the original. Followed by rheumatologist. Had to change providers due to an insurance change earlier this year resulting in patient not being able to have Remicade for ~6 months. 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 Formatting of this note might be different from the original. Treated with PRN miralax. Recent colonoscopy 07/2019. Formatting of this note might be different from the original. Treated with PRN miralax. Recent colonoscopy 07/2019. Formatting of this note might be different from the original. Formatting of this note might be different f   Status post placement of ureteral stent 02/06/2021   Tooth infection 08/12/2020   Unstable angina (HCC) 07/29/2023   Ureteral stone 01/20/2022   Urinary tract infection, site not specified 06/26/2022    Past Surgical History:  Procedure Laterality Date   BACK SURGERY     L5-S1   CERVICAL CONE BIOPSY  04/2000   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   CORONARY STENT INTERVENTION N/A 07/01/2023   Procedure: CORONARY STENT INTERVENTION;  Surgeon: Iran Ouch, MD;  Location: [facility]  INVASIVE CV LAB;  Service: Cardiovascular;  Laterality: N/A;   EYE SURGERY     GASTRIC BYPASS  2017   Sleve   LAPAROSCOPIC GASTRIC SLEEVE RESECTION     LEFT HEART CATH AND CORONARY ANGIOGRAPHY N/A 07/01/2023   Procedure: LEFT HEART CATH AND CORONARY ANGIOGRAPHY;  Surgeon: Iran Ouch, MD;  Location: [facility] INVASIVE CV LAB;  Service: Cardiovascular;  Laterality: N/A;   LEFT HEART CATH AND CORONARY ANGIOGRAPHY N/A 08/01/2023   Procedure: LEFT HEART CATH AND CORONARY ANGIOGRAPHY;  Surgeon: Yates Decamp, MD;  Location: [facility] INVASIVE CV LAB;  Service: Cardiovascular;  Laterality: N/A;   Low back disc surgery     06/2000   TOTAL KNEE ARTHROPLASTY Right 11/2016    Current Medications: Current Meds  Medication Sig   acetaminophen (TYLENOL) 500 MG tablet Take 1,000 mg by mouth every 12 (twelve) hours as needed for mild pain or moderate pain.   albuterol (PROVENTIL) (2.5 MG/3ML) 0.083% nebulizer solution Take 3 mLs (2.5 mg total) by nebulization every 6 (six) hours as needed for wheezing or shortness of breath.   amLODipine (NORVASC) 10 MG tablet Take 1 tablet (10 mg total) by mouth daily.   atorvastatin (LIPITOR) 80 MG tablet Take 1  tablet (80 mg total) by mouth daily.   B Complex-C (B-COMPLEX WITH VITAMIN C) tablet Take 1 tablet by mouth daily.   Cholecalciferol (VITAMIN D3) 50 MCG (2000 UT) TABS Take 1 tablet by mouth daily.   EPINEPHrine (EPIPEN 2-PAK) 0.3 mg/0.3 mL IJ SOAJ injection Inject 0.3 mg into the muscle as needed for anaphylaxis.   esomeprazole (NEXIUM) 40 MG capsule Take 1 capsule (40 mg total) by mouth 2 (two) times daily before a meal. (Patient taking differently: Take 80 mg by mouth daily.)   famotidine (PEPCID) 40 MG tablet Take 1 tablet (40 mg total) by mouth daily.   Golimumab (SIMPONI ARIA IV) Inject 200 mg into the vein every 8 (eight) weeks.   levocetirizine (XYZAL) 5 MG tablet Take 10 mg by mouth in the morning.   linaclotide (LINZESS) 72 MCG capsule Take 144 mcg by mouth  daily before breakfast.   mirabegron ER (MYRBETRIQ) 50 MG TB24 tablet Take 1 tablet (50 mg total) by mouth daily.   montelukast (SINGULAIR) 10 MG tablet Take 1 tablet (10 mg total) by mouth at bedtime. (Patient taking differently: Take 10 mg by mouth daily.)   nitroGLYCERIN (NITROSTAT) 0.4 MG SL tablet Place 1 tablet (0.4 mg total) under the tongue every 5 (five) minutes as needed for chest pain.   OXYGEN Inhale 2 L into the lungs at bedtime.   ticagrelor (BRILINTA) 90 MG TABS tablet Take 1 tablet (90 mg total) by mouth 2 (two) times daily.   ZOLMitriptan (ZOMIG) 2.5 MG tablet Take 1 tablet (2.5 mg total) by mouth once for 1 dose. May repeat in 2 hours if headache persists or recurs. (Patient taking differently: Take 2.5 mg by mouth daily as needed for migraine.)   [DISCONTINUED] amLODipine (NORVASC) 5 MG tablet Take 1 tablet (5 mg total) by mouth daily.     Allergies:   Albuterol sulfate, Tape, Formoterol, Hydrocodone, Hydrocodone-acetaminophen, Nickel, Other, Oxycodone-acetaminophen, Salmeterol, Strawberry extract, Advair hfa [fluticasone-salmeterol], Proventil hfa [albuterol], Asa [aspirin], Cleocin [clindamycin], Clindamycin/lincomycin, Fludrocortisone acetate, Gabapentin, Hydrochlorothiazide, Imipramine, Ipratropium bromide, Medroxyprogesterone, Meloxicam, Metformin and related, Nystatin, Penicillins, Pred forte [prednisolone], Prednisone, Solu-medrol [methylprednisolone], Sulfa antibiotics, Tegaderm ag mesh [silver], Toradol [ketorolac tromethamine], and Tramadol   Social History   Socioeconomic History   Marital status: Married    Spouse name: Not on file   Number of children: Not on file   Years of education: Not on file   Highest education level: Professional school degree (e.g., MD, DDS, DVM, JD)  Occupational History   Not on file  Tobacco Use   Smoking status: Never    Passive exposure: Never   Smokeless tobacco: Never  Vaping Use   Vaping status: Never Used  Substance and  Sexual Activity   Alcohol use: Yes    Comment: rare   Drug use: Never   Sexual activity: Yes  Other Topics Concern   Not on file  Social History Narrative   Not on file   Social Drivers of Health   Financial Resource Strain: High Risk (07/14/2023)   Overall Financial Resource Strain (CARDIA)    Difficulty of Paying Living Expenses: Hard  Food Insecurity: No Food Insecurity (07/30/2023)   Hunger Vital Sign    Worried About Running Out of Food in the Last Year: Never true    Ran Out of Food in the Last Year: Never true  Transportation Needs: No Transportation Needs (07/30/2023)   PRAPARE - Administrator, Civil Service (Medical): No    Lack of Transportation (  Non-Medical): No  Physical Activity: Unknown (07/14/2023)   Exercise Vital Sign    Days of Exercise per Week: 0 days    Minutes of Exercise per Session: Not on file  Stress: No Stress Concern Present (07/14/2023)   [employer] of Occupational Health - Occupational Stress Questionnaire    Feeling of Stress : Not at all  Social Connections: Moderately Isolated (07/14/2023)   Social Connection and Isolation Panel [NHANES]    Frequency of Communication with Friends and Family: More than three times a week    Frequency of Social Gatherings with Friends and Family: Once a week    Attends Religious Services: Never    Database administrator or Organizations: No    Attends Engineer, structural: Not on file    Marital Status: Married     Family History: The patient's family history includes Cancer in an other family member; Diabetes in her brother, father, mother, paternal grandmother, and sister; Hypertension in her brother, father, mother, and sister. There is no history of Asthma, Allergic rhinitis, Atopy, or Eczema. She was adopted. ROS:   Please see the history of present illness.    All 14 point review of systems negative except as described per history of present illness  EKGs/Labs/Other Studies  Reviewed:         Recent Labs: 11/30/2022: TSH 0.52 06/29/2023: ALT 21 07/31/2023: Magnesium 1.9 08/02/2023: BUN <5; Creatinine, Ser 0.71; Hemoglobin 12.1; Platelets 293; Potassium 3.5; Sodium 138  Recent Lipid Panel    Component Value Date/Time   CHOL 177 06/01/2023 0900   TRIG 145 06/01/2023 0900   HDL 58 06/01/2023 0900   CHOLHDL 3.1 06/01/2023 0900   LDLCALC 94 06/01/2023 0900    Physical Exam:    VS:  BP (!) 126/90 (BP Location: Left Arm, Patient Position: Sitting)   Pulse 84   Ht 5\' 3"  (1.6 m)   Wt 218 lb (98.9 kg)   SpO2 98%   BMI 38.62 kg/m     Wt Readings from Last 3 Encounters:  08/08/23 218 lb (98.9 kg)  07/30/23 219 lb 14.4 oz (99.7 kg)  07/21/23 221 lb 9 oz (100.5 kg)     GEN:  Well nourished, well developed in no acute distress HEENT: Normal NECK: No JVD; No carotid bruits LYMPHATICS: No lymphadenopathy CARDIAC: RRR, no murmurs, no rubs, no gallops RESPIRATORY:  Clear to auscultation without rales, wheezing or rhonchi  ABDOMEN: Soft, non-tender, non-distended MUSCULOSKELETAL:  No edema; No deformity  SKIN: Warm and dry LOWER EXTREMITIES: no swelling NEUROLOGIC:  Alert and oriented x 3 PSYCHIATRIC:  Normal affect   ASSESSMENT:    1. Arthritis   2. Malignant pericardial effusion   3. Coronary artery disease of native artery of native heart with stable angina pectoris (HCC)   4. Essential hypertension   5. Rheumatoid arthritis of multiple sites with 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 factor (HCC)    PLAN:    In order of problems listed above:  Recurrent chest pain.  Recent cardiac catheterization done literally few days ago was negative still possible to small vessel disease may be vasospasm as well will increase dose of amlodipine.  Continue rest of the medication including antiplatelets therapy. Pericardial effusion it is not malignant and will take away this diagnosis only small without evidence of tamponade in few weeks we will recheck her echocardiogram.   Could be related to her rheumatological problems should be referred to rheumatology. Essential hypertension blood pressure well-controlled continue present management. Dyslipidemia I did  review K PN which show me LDL 94 HDL 58 we will recheck fasting lipid profile will augment her medical therapy target LDL is below 70 favorably below 55   Medication Adjustments/Labs and Tests Ordered: Current medicines are reviewed at length with the patient today.  Concerns regarding medicines are outlined above.  Orders Placed This Encounter  Procedures   Ambulatory referral to Rheumatology   ECHOCARDIOGRAM COMPLETE   Medication changes:  Meds ordered this encounter  Medications   amLODipine (NORVASC) 10 MG tablet    Sig: Take 1 tablet (10 mg total) by mouth daily.    Dispense:  90 tablet    Refill:  3    Signed, Georgeanna Lea, MD, [facility] 08/08/2023 12:15 PM    [facility]

## 2023-08-08 NOTE — Telephone Encounter (Signed)
Received voice mail message today from Casey [name] that Dr. Bing Matter is putting her cardiac rehab on hold for now. Please cancel appointments until further notice.

## 2023-08-08 NOTE — Patient Instructions (Signed)
Medication Instructions:   INCREASE: Amlodipine to 10mg  daily   Lab Work: None Ordered If you have labs (blood work) drawn today and your tests are completely normal, you will receive your results only by: MyChart Message (if you have MyChart) OR A paper copy in the mail If you have any lab test that is abnormal or we need to change your treatment, we will call you to review the results.   Testing/Procedures: in 5 weeks Your physician has requested that you have an echocardiogram. Echocardiography is a painless test that uses sound waves to create images of your heart. It provides your doctor with information about the size and shape of your heart and how well your heart's chambers and valves are working. This procedure takes approximately one hour. There are no restrictions for this procedure. Please do NOT wear cologne, perfume, aftershave, or lotions (deodorant is allowed). Please arrive 15 minutes prior to your appointment time.    Please note: We ask at that you not bring children with you during ultrasound (echo/ vascular) testing. Due to room size and safety concerns, children are not allowed in the ultrasound rooms during exams. Our front office staff cannot provide observation of children in our lobby area while testing is being conducted. An adult accompanying a patient to their appointment will only be allowed in the ultrasound room at the discretion of the ultrasound technician under special circumstances. We apologize for any inconvenience.    Follow-Up: At [facility], you and your health needs are our priority.  As part of our continuing mission to provide you with exceptional heart care, we have created designated Provider Care Teams.  These Care Teams include your primary Cardiologist (physician) and Advanced Practice Providers (APPs -  Physician Assistants and Nurse Practitioners) who all work together to provide you with the care you need, when you need it.  We  recommend signing up for the patient portal called "MyChart".  Sign up information is provided on this After Visit Summary.  MyChart is used to connect with patients for Virtual Visits (Telemedicine).  Patients are able to view lab/test results, encounter notes, upcoming appointments, etc.  Non-urgent messages can be sent to your provider as well.   To learn more about what you can do with MyChart, go to [email or web address].    Your next appointment:   5 week(s)  The format for your next appointment:   In Person  Provider:   Gypsy Balsam, MD    Other Instructions NA

## 2023-08-09 ENCOUNTER — Telehealth (HOSPITAL_COMMUNITY): Payer: Self-pay | Admitting: *Deleted

## 2023-08-09 NOTE — Telephone Encounter (Signed)
 Left message will cancel appointments as requested.Thayer Headings RN BSN

## 2023-08-12 ENCOUNTER — Encounter (HOSPITAL_COMMUNITY): Payer: 59

## 2023-08-15 ENCOUNTER — Encounter: Payer: Self-pay | Admitting: Cardiology

## 2023-08-15 ENCOUNTER — Encounter (HOSPITAL_COMMUNITY): Payer: 59

## 2023-08-17 ENCOUNTER — Encounter (HOSPITAL_COMMUNITY): Payer: 59

## 2023-08-18 ENCOUNTER — Telehealth: Payer: Self-pay

## 2023-08-18 DIAGNOSIS — R079 Chest pain, unspecified: Secondary | ICD-10-CM

## 2023-08-18 LAB — TROPONIN T: Troponin T (Highly Sensitive): <6 ng/L (ref 0–14)

## 2023-08-18 MED ORDER — ISOSORBIDE MONONITRATE ER 30 MG PO TB24: 30.0000 mg | ORAL_TABLET | Freq: Every day | ORAL | 0 refills | Status: DC

## 2023-08-18 MED ORDER — RANOLAZINE ER 500 MG PO TB12: 500.0000 mg | ORAL_TABLET | Freq: Two times a day (BID) | ORAL | 3 refills | Status: DC

## 2023-08-18 NOTE — Addendum Note (Signed)
 Addended by: Baldo Ash D on: 08/18/2023 02:09 PM   Modules accepted: Orders

## 2023-08-18 NOTE — Telephone Encounter (Signed)
 Imdur 30mg  q d sent to pharmacy, Troponin Stat ordered per Dr. Bing Matter.

## 2023-08-18 NOTE — Telephone Encounter (Signed)
 Pt not able to tolerate Imdur. Per Dr. Bing Matter, change to Ranolazine 500mg  Bid. Sent to pharmacy.

## 2023-08-18 NOTE — Telephone Encounter (Signed)
 Patient is following up. She states she cannot take Imdur because it causes massive headaches and she would like a call back to discuss.

## 2023-08-18 NOTE — Telephone Encounter (Signed)
 Spoke with pt in office and Ranolazine was sent in place of Imdur

## 2023-08-19 ENCOUNTER — Encounter (HOSPITAL_COMMUNITY): Payer: 59

## 2023-08-22 ENCOUNTER — Encounter (HOSPITAL_COMMUNITY): Payer: 59

## 2023-08-22 ENCOUNTER — Telehealth: Payer: Self-pay | Admitting: Cardiology

## 2023-08-22 NOTE — Telephone Encounter (Signed)
 Pt c/o medication issue:  1. Name of Medication: Ranexa   2. How are you currently taking this medication (dosage and times per day)?   3. Are you having a reaction (difficulty breathing--STAT)?   4. What is your medication issue? Causing her not to sleep and also causing stomach issues- she says she is also having palpitations    Patient c/o Palpitations:  STAT if patient reporting lightheadedness, shortness of breath, or chest pain  How long have you had palpitations/irregular HR/ Afib? Are you having the symptoms now? She is having palpitations at this time- when she moves around she gets palpitations  Are you currently experiencing lightheadedness, SOB or CP? Yes- when she have the palpitations, not at this time  Do you have a history of afib (atrial fibrillation) or irregular heart rhythm?   Have you checked your BP or HR? (document readings if available):137/84, 138/90 pulse ranging from 92 to 112t   Are you experiencing any other symptoms? no

## 2023-08-22 NOTE — Telephone Encounter (Signed)
 Pt stated that she was having trouble sleeping and GI issues and thinks it may be the Ranexa . Advised per Dr. Krasowski to stop the Ranexa  to see if her symptoms resolve. Pt agreed and verbalized understanding. She stated that she would call back in a few days to let us  know if this helps.

## 2023-08-24 ENCOUNTER — Telehealth: Payer: Self-pay

## 2023-08-24 ENCOUNTER — Encounter (HOSPITAL_COMMUNITY): Payer: 59

## 2023-08-24 NOTE — Telephone Encounter (Signed)
 Left message on My Chart with normal lab results per Dr. Vanetta Shawl note. Routed to PCP.

## 2023-08-25 ENCOUNTER — Encounter (HOSPITAL_BASED_OUTPATIENT_CLINIC_OR_DEPARTMENT_OTHER): Payer: Self-pay | Admitting: Emergency Medicine

## 2023-08-25 ENCOUNTER — Other Ambulatory Visit (HOSPITAL_BASED_OUTPATIENT_CLINIC_OR_DEPARTMENT_OTHER): Payer: Self-pay

## 2023-08-25 ENCOUNTER — Ambulatory Visit (HOSPITAL_BASED_OUTPATIENT_CLINIC_OR_DEPARTMENT_OTHER)
Admission: EM | Admit: 2023-08-25 | Discharge: 2023-08-25 | Disposition: A | Discharge status: Home / Self Care | Payer: 59 | Attending: Family Medicine | Admitting: Family Medicine

## 2023-08-25 DIAGNOSIS — B370 Candidal stomatitis: Secondary | ICD-10-CM | POA: Diagnosis not present

## 2023-08-25 MED ORDER — FLUCONAZOLE 100 MG PO TABS
100.0000 mg | ORAL_TABLET | Freq: Every day | ORAL | 0 refills | Status: DC
  Filled 2023-08-25: qty 10, 10d supply, fill #0

## 2023-08-25 NOTE — ED Triage Notes (Signed)
Pt c/o patchy white areas to tongue this morning. Pt has had candidiasis in the past.

## 2023-08-25 NOTE — ED Provider Notes (Signed)
[name]    CSN: [id number] Arrival date & time: 08/25/23  1608      History   Chief Complaint No chief complaint on file.   HPI Casey [name] is a 53 y.o. female.   Reports history of oral thrush.  She cannot take nystatin due to allergies.  In the past fluconazole pills have not been very helpful.  She reports that most recent episode of thrush she saw infectious disease in [place] and was given IV antifungals daily for several days.  She is not currently seeing that infectious disease group and is requesting referral to an infectious disease provider in [place].  Explained that we do not make referrals from urgent care but could provide her information on providers she might contact.  She believes she has thrush right now.     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 Atopic dermatitis Formatting of this note might be different from the original. Formatting of this note might be different from the original. Atopic dermatitis Formatting of this note might be different from the original. Formatting of this note might be different from the original. Formatting of this note might be different from the or   Cataract 05/06/2015   Contusion of left wrist 07/08/2021   Coronary artery disease s/p PCI/DES to LAD 07/01/2023 12/28/2022   Crohn's disease (HCC) 04/21/2015   De Quervain's tenosynovitis, left 07/31/2021   Diabetes mellitus without complication (HCC)    Diabetes type 2, controlled (HCC) 04/21/2015   Dry eyes 05/06/2015   DVT (deep venous thrombosis) (HCC)    Dysfunction of both eustachian tubes 08/31/2012   Encounter for monitoring immunomodulating therapy 11/20/2021    Environmental allergies 08/12/2020   Esophageal candidiasis (HCC) 06/26/2022   Essential hypertension 06/27/2015   Fibrositis 08/28/2014   Formatting of this note might be different from the original. Fibromyalgia Formatting of this note might be different from the original. Formatting of this note might be different from the original. Fibromyalgia   Flank pain 11/24/2020   Gastroesophageal reflux disease with esophagitis without hemorrhage 08/01/2019   Formatting of this note might be different from the original. Formatting of this note might be different from the original. 07/2019: chronic symptoms of esophageal reflux since prior to sleeve gastrectomy surgery. Symptoms have worsened over the last 6 months with recent EGD findings of grade B esophagitis, irregular z line, erythematous mucosa, and evidence of sleeve gastrectomy. 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 Formatting of this note might be different from the original. 07/2019: 8 mm colonic polyp removed during colonoscopy, biopsy showed serrated polyp. Repeat colonoscopy recommended in one year. Formatting of this note might be different from the original. Added automatically from request for surgery [id number] Formatting of this note might b   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   History of kidney stones 08/12/2020   Hypercholesterolemia 10/07/2022   Hypertension, essential, benign 06/27/2015   Hypertensive disorder 08/28/2014   Formatting of  this note might be different from the original. Hypertension Formatting of this note might be different from the original. Formatting of this note might be different from the original. 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 Keratoconus Formatting of this note might be different from the original. Formatting of this note might be different from the original. Keratoconus Formatting of this note might be different from the original. Formatting of this note might be different from the original. Keratoconus Formatting of this note might be different from the or   Microhematuria 06/27/2015   Migraine 08/28/2014   Formatting of this note might be different from the original. Migraine Formatting of this note might be different from the original. Formatting of this note might be different from the original. Migraine Formatting of this note might be different from the original. Migraine Formatting of this note might be different from the original. Migraine Formatting of this note might be different from the or   Migraine without aura and without status migrainosus, not intractable 08/28/2014   Formatting of this note might be different from the original. Formatting of this note might be different from the original. Formatting of this note might be different from the original. Migraine Formatting of this note might be different from the original. Migraine Formatting of this note might be different from the original. Formatting of this note might be different from the original. Migraine F   Mild intermittent asthma without complication 10/30/2008   Formatting of this note might be different from the original. Formatting of this note might be different from the original. Formatting of this note might be different from the original. Asthma Formatting of this note might be different from the original. Asthma Formatting of this note might be different from the original. Formatting of this note might be different from the original. 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 Formatting of this note might be different from the original. 01/25/2016 with Dr. Dionisio David at [facility]. [place] of this note might be different from the original. 01/25/2016 with Dr. Dionisio David at [facility]. [place] of this note might be different from the original. Formatting of this note might be different from    Seronegative inflammatory arthritis 08/28/2014   Formatting of this note might be different from the original. Formatting of this note might be different from the original. Arthritis; diagnosed as IBD related - 2012 Formatting of this note might be different from the original. Followed by rheumatologist. Had to change providers due to an insurance change earlier this year resulting in patient not being able to have Remicade for ~6 months. [place]   Sero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 arthritis (HCC)    Slow transit constipation 08/01/2019   Formatting  of this note might be different from the original. Formatting of this note might be different from the original. Treated with PRN miralax. Recent colonoscopy 07/2019. Formatting of this note might be different from the original. Treated with PRN miralax. Recent colonoscopy 07/2019. Formatting of this note might be different from the original. 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   Coronary artery disease s/p PCI/DES to LAD 07/01/2023 12/28/2022   Precordial chest pain 10/13/2022   Hyperlipidemia 10/07/2022   Hiatal hernia 10/07/2022   Esophageal candidiasis (HCC) 06/26/2022   Urinary tract infection, site not specified 06/26/2022   Ureteral stone 01/20/2022   Encounter for monitoring immunomodulating therapy 11/20/2021   Rheumatoid arthritis of multiple sites with 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 factor (HCC) 11/16/2021   Pyelonephritis 09/25/2021   De Quervain's tenosynovitis, left 07/31/2021   Contusion of left wrist 07/08/2021   Status post placement of ureteral stent 02/06/2021   Proteinuria 08/12/2020   Environmental allergies 08/12/2020   History of kidney stones 08/12/2020   Tooth infection 08/12/2020   Gastroesophageal reflux disease with esophagitis without hemorrhage 08/01/2019   Slow transit constipation 08/01/2019   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   History of colon polyps 06/19/2019   Irregular astigmatism of both eyes 10/11/2018   Nephrolithiasis 09/08/2018   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   Plantar wart 11/04/2016   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   Myopia with astigmatism and presbyopia 05/07/2016   Hematochezia 03/18/2016   Overactive bladder 11/18/2015   Incontinence 10/07/2015   Albuminuria 06/27/2015   Essential hypertension 06/27/2015    Microhematuria 06/27/2015   Hematuria 06/18/2015   Cataract 05/06/2015   Dry eyes 05/06/2015   Crohn's disease (HCC) 04/21/2015   Osteoarthritis of both knees 04/21/2015   Right knee pain 04/21/2015   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/30/2014   History of abnormal cervical Pap smear 11/29/2014   Fibrositis 08/28/2014   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   Migraine without aura and without status migrainosus, not intractable 08/28/2014   Migraine 08/28/2014   Seronegative inflammatory arthritis 08/28/2014   Increased urinary frequency 07/16/2013   Dysfunction of both eustachian tubes 08/31/2012   Asthma 10/30/2008   Mild intermittent asthma without complication 10/30/2008    Past Surgical History:  Procedure Laterality Date   BACK SURGERY     L5-S1   CERVICAL CONE BIOPSY  04/2000   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   CORONARY STENT INTERVENTION N/A 07/01/2023   Procedure: CORONARY STENT INTERVENTION;  Surgeon: Iran Ouch, MD;  Location: [facility];  Service: Cardiovascular;  Laterality: N/A;   EYE SURGERY     GASTRIC BYPASS  2017   Sleve   LAPAROSCOPIC GASTRIC SLEEVE RESECTION     LEFT HEART CATH AND CORONARY ANGIOGRAPHY N/A 07/01/2023   Procedure: LEFT HEART CATH AND CORONARY ANGIOGRAPHY;  Surgeon: Iran Ouch, MD;  Location: [facility];  Service: Cardiovascular;  Laterality: N/A;  LEFT HEART CATH AND CORONARY ANGIOGRAPHY N/A 08/01/2023   Procedure: LEFT HEART CATH AND CORONARY ANGIOGRAPHY;  Surgeon: Yates Decamp, MD;  Location: [facility];  Service: Cardiovascular;  Laterality: N/A;   Low back disc surgery     06/2000   TOTAL KNEE ARTHROPLASTY Right 11/2016    OB History     Gravida  0   Para  0   Term  0   Preterm  0   AB  0   Living  0      SAB  0   IAB  0   Ectopic  0   Multiple  0   Live Births  0            Home Medications    Prior to Admission medications   Medication Sig Start Date End  Date Taking? Authorizing Provider  amLODipine (NORVASC) 10 MG tablet Take 1 tablet (10 mg total) by mouth daily. 08/08/23 11/06/23 Yes Georgeanna Lea, MD  esomeprazole (NEXIUM) 40 MG capsule Take 1 capsule (40 mg total) by mouth 2 (two) times daily before a meal. Patient taking differently: Take 80 mg by mouth daily. 11/30/22  Yes Olive Bass, FNP  famotidine (PEPCID) 40 MG tablet Take 1 tablet (40 mg total) by mouth daily. 11/30/22  Yes Olive Bass, FNP  fluconazole (DIFLUCAN) 100 MG tablet Take 1 tablet (100 mg total) by mouth daily for 10 days. 08/25/23 09/04/23 Yes Prescilla Sours, FNP  linaclotide Karlene Einstein) 72 MCG capsule Take 144 mcg by mouth daily before breakfast. 03/23/23  Yes [provider]  mirabegron ER (MYRBETRIQ) 50 MG TB24 tablet Take 1 tablet (50 mg total) by mouth daily. 11/30/22  Yes Olive Bass, FNP  montelukast (SINGULAIR) 10 MG tablet Take 1 tablet (10 mg total) by mouth at bedtime. Patient taking differently: Take 10 mg by mouth daily. 11/30/22  Yes Olive Bass, FNP  ticagrelor (BRILINTA) 90 MG TABS tablet Take 1 tablet (90 mg total) by mouth 2 (two) times daily. 07/01/23  Yes Lonia Blood, MD  acetaminophen (TYLENOL) 500 MG tablet Take 1,000 mg by mouth every 12 (twelve) hours as needed for mild pain or moderate pain.    [provider]  albuterol (PROVENTIL) (2.5 MG/3ML) 0.083% nebulizer solution Take 3 mLs (2.5 mg total) by nebulization every 6 (six) hours as needed for wheezing or shortness of breath. 11/30/22   Olive Bass, FNP  atorvastatin (LIPITOR) 80 MG tablet Take 1 tablet (80 mg total) by mouth daily. 07/03/23   Lonia Blood, MD  B Complex-C (B-COMPLEX WITH VITAMIN C) tablet Take 1 tablet by mouth daily. 09/06/19   [provider]  Cholecalciferol (VITAMIN D3) 50 MCG (2000 UT) TABS Take 1 tablet by mouth daily.    [provider]  EPINEPHrine (EPIPEN 2-PAK) 0.3 mg/0.3 mL  IJ SOAJ injection Inject 0.3 mg into the muscle as needed for anaphylaxis. 01/18/23   Padgett, Pilar Grammes, MD  Golimumab (SIMPONI ARIA IV) Inject 200 mg into the vein every 8 (eight) weeks.    [provider]  levocetirizine (XYZAL) 5 MG tablet Take 10 mg by mouth in the morning.    [provider]  nitroGLYCERIN (NITROSTAT) 0.4 MG SL tablet Place 1 tablet (0.4 mg total) under the tongue every 5 (five) minutes as needed for chest pain. 10/13/22   Georgeanna Lea, MD  OXYGEN Inhale 2 L into the lungs at bedtime.    [provider]  ranolazine (RANEXA) 500 MG 12 hr tablet Take 1 tablet (500 mg total) by mouth 2 (two) times daily. 08/18/23   Georgeanna Lea, MD  ZOLMitriptan (ZOMIG) 2.5 MG tablet Take 1 tablet (2.5 mg total) by mouth once for 1 dose. May repeat in 2 hours if headache persists or recurs. Patient taking differently: Take 2.5 mg by mouth daily as needed for migraine. 11/30/22 09/16/23  Olive Bass, FNP    Family History Family History  Adopted: Yes  Problem Relation Age of Onset   Diabetes Paternal Grandmother    Diabetes Father    Hypertension Father    Diabetes Mother    Hypertension Mother    Hypertension Brother    Diabetes Brother    Hypertension Sister    Diabetes Sister    Cancer Other    Asthma Neg Hx    Allergic rhinitis Neg Hx    Atopy Neg Hx    Eczema Neg Hx     Social History Social History   Tobacco Use   Smoking status: Never    Passive exposure: Never   Smokeless tobacco: Never  Vaping Use   Vaping status: Never Used  Substance Use Topics   Alcohol use: Yes    Comment: rare   Drug use: Never     Allergies   Albuterol sulfate, Tape, Formoterol, Hydrocodone, Hydrocodone-acetaminophen, Nickel, Other, Oxycodone-acetaminophen, Salmeterol, Strawberry extract, Advair hfa [fluticasone-salmeterol], Proventil hfa [albuterol], Asa [aspirin], Cleocin [clindamycin], Clindamycin/lincomycin, Fludrocortisone acetate,  Gabapentin, Hydrochlorothiazide, Imipramine, Ipratropium bromide, Medroxyprogesterone, Meloxicam, Metformin and related, Nystatin, Penicillins, Pred forte [prednisolone], Prednisone, Solu-medrol [methylprednisolone], Sulfa antibiotics, Tegaderm ag mesh [silver], Toradol [ketorolac tromethamine], and Tramadol   Review of Systems Review of Systems  Constitutional:  Negative for chills and fever.  HENT:  Negative for ear pain and sore throat.        Reporting white plaque or coating on her tongue.  Eyes:  Negative for pain and visual disturbance.  Respiratory:  Negative for cough and shortness of breath.   Cardiovascular:  Negative for chest pain and palpitations.  Gastrointestinal:  Negative for abdominal pain and vomiting.  Genitourinary:  Negative for dysuria and hematuria.  Musculoskeletal:  Negative for arthralgias and back pain.  Skin:  Negative for color change and rash.  Neurological:  Negative for seizures and syncope.  All other systems reviewed and are negative.    Physical Exam Triage Vital Signs ED Triage Vitals  Encounter Vitals Group     BP 08/25/23 1641 (!) 140/87     Systolic BP Percentile --      Diastolic BP Percentile --      Pulse Rate 08/25/23 1641 93     Resp 08/25/23 1641 18     Temp 08/25/23 1641 98.2 F (36.8 C)     Temp Source 08/25/23 1641 Oral     SpO2 08/25/23 1641 95 %     Weight --      Height --      Head Circumference --      Peak Flow --      Pain Score 08/25/23 1639 0     Pain Loc --      Pain Education --      Exclude from Growth Chart --    No data found.  Updated Vital Signs BP (!) 140/87 (BP Location: Right Arm)   Pulse 93   Temp 98.2 F (36.8 C) (Oral)   Resp 18   SpO2 95%   Visual Acuity Right  Eye Distance:   Left Eye Distance:   Bilateral Distance:    Right Eye Near:   Left Eye Near:    Bilateral Near:     Physical Exam Vitals and nursing note reviewed.  Constitutional:      General: She is not in acute  distress.    Appearance: She is well-developed. She is morbidly obese. She is not ill-appearing or toxic-appearing.  HENT:     Head: Normocephalic and atraumatic.     Right Ear: Hearing, tympanic membrane, ear canal and external ear normal.     Left Ear: Hearing, tympanic membrane, ear canal and external ear normal.     Nose: Nose normal.     Mouth/Throat:     Lips: Pink.     Tongue: Lesions (White plaque) present.     Pharynx: Uvula midline. No oropharyngeal exudate or posterior oropharyngeal erythema.  Eyes:     Conjunctiva/sclera: Conjunctivae normal.     Pupils: Pupils are equal, round, and reactive to light.  Cardiovascular:     Rate and Rhythm: Normal rate and regular rhythm.     Heart sounds: Normal heart sounds, S1 normal and S2 normal. No murmur heard. Pulmonary:     Effort: Pulmonary effort is normal. No respiratory distress.     Breath sounds: Normal breath sounds. No decreased breath sounds, wheezing, rhonchi or rales.  Abdominal:     Palpations: Abdomen is soft.     Tenderness: There is no abdominal tenderness.     Comments: Large pannus.  Musculoskeletal:        General: No swelling.     Cervical back: Neck supple.  Lymphadenopathy:     Head:     Right side of head: No submental, submandibular, tonsillar, preauricular or posterior auricular adenopathy.     Left side of head: No submental, submandibular, tonsillar, preauricular or posterior auricular adenopathy.     Cervical: No cervical adenopathy.     Right cervical: No superficial cervical adenopathy.    Left cervical: No superficial cervical adenopathy.  Skin:    General: Skin is warm and dry.     Capillary Refill: Capillary refill takes less than 2 seconds.     Findings: No rash.  Neurological:     Mental Status: She is alert and oriented to person, place, and time.  Psychiatric:        Mood and Affect: Mood normal.      UC Treatments / Results  Labs (all labs ordered are listed, but only abnormal  results are displayed) Labs Reviewed - No data to display  EKG   Radiology No results found.  Procedures Procedures (including critical care time)  Medications Ordered in UC Medications - No data to display  Initial Impression / Assessment and Plan / UC Course  I have reviewed the triage vital signs and the nursing notes.  Pertinent labs & imaging results that were available during my care of the patient were reviewed by me and considered in my medical decision making (see chart for details).  Reviewed options for treating her thrush with the pharmacist, in light of her allergies and current medications.  Will treat with fluconazole, 100 mg, #1, daily for 10 days.  The allergy reaction with fludrocortisone acetate is mild or low risk according to the pharmacist.  She does have some risk of orthostatic hypotension due to being on amlodipine.  Encouraged to drink plenty of fluids and stand up slowly.  She is on Brilinta and there is some  risk of increased anticoagulation.  Advised to stop the fluconazole if she vomits blood, has a nosebleed, mouth bleed, urinates blood, has blood in her stool or unusual bleeding.  If those side effects occur stop the fluconazole.  Explained that we cannot do referrals but provided information about infectious disease in [place].  She may call for self-referral.  Otherwise when she gets set up with family practice they can make a referral for her.  Follow-up if symptoms do not improve, worsen or new symptoms occur.  At the end of the visit the patient decided she did not intend to pick up or take the fluconazole.  She will try to get connected with infectious disease.  Or she will speak to her cardiologist about whether or not they feel she can take fluconazole.  Mostly she feels like fluconazole no longer works for her and has no value. Final Clinical Impressions(s) / UC Diagnoses   Final diagnoses:  Oral thrush     Discharge Instructions       Exam shows thrush.  Will treat with fluconazole, 100 mg, daily for 10 days.  Not able to take nystatin due to allergies.  Reviewed use of fluconazole regarding her allergies and current medications with the pharmacist.  Should be cautious about possible low blood pressure reaction with her amlodipine so stay well-hydrated and stand up slowly.  Some potential of the Brilinta being potentiated.  So be aware of any unusual bleeding, nosebleeds, mouth bleeding, vomiting blood, bloody bowel movements, blood in urine.  If any unusual bleeding or bruising stop the fluconazole.    Provided information about infectious disease practice that she may contact for self-referral.  Otherwise she can speak with the family practice group she is joining and asked them for referral.  Return here if symptoms do not improve, worsen, or new symptoms occur.     ED Prescriptions     Medication Sig Dispense Auth. Provider   fluconazole (DIFLUCAN) 100 MG tablet Take 1 tablet (100 mg total) by mouth daily for 10 days. 10 tablet Prescilla Sours, FNP      PDMP not reviewed this encounter.   Prescilla Sours, FNP 08/25/23 1731

## 2023-08-25 NOTE — Discharge Instructions (Addendum)
Exam shows thrush.  Will treat with fluconazole, 100 mg, daily for 10 days.  Not able to take nystatin due to allergies.  Reviewed use of fluconazole regarding her allergies and current medications with the pharmacist.  Should be cautious about possible low blood pressure reaction with her amlodipine so stay well-hydrated and stand up slowly.  Some potential of the Brilinta being potentiated.  So be aware of any unusual bleeding, nosebleeds, mouth bleeding, vomiting blood, bloody bowel movements, blood in urine.  If any unusual bleeding or bruising stop the fluconazole.    Provided information about infectious disease practice that she may contact for self-referral.  Otherwise she can speak with the family practice group she is joining and asked them for referral.  Return here if symptoms do not improve, worsen, or new symptoms occur.

## 2023-08-26 ENCOUNTER — Encounter (HOSPITAL_COMMUNITY): Payer: 59

## 2023-08-26 ENCOUNTER — Other Ambulatory Visit: Payer: Self-pay

## 2023-08-26 ENCOUNTER — Encounter (HOSPITAL_BASED_OUTPATIENT_CLINIC_OR_DEPARTMENT_OTHER): Payer: Self-pay

## 2023-08-26 ENCOUNTER — Ambulatory Visit (HOSPITAL_BASED_OUTPATIENT_CLINIC_OR_DEPARTMENT_OTHER): Payer: Self-pay | Admitting: Student

## 2023-08-26 ENCOUNTER — Emergency Department (HOSPITAL_BASED_OUTPATIENT_CLINIC_OR_DEPARTMENT_OTHER)
Admission: EM | Admit: 2023-08-26 | Discharge: 2023-08-26 | Disposition: A | Discharge status: Home / Self Care | Payer: 59

## 2023-08-26 ENCOUNTER — Other Ambulatory Visit (HOSPITAL_BASED_OUTPATIENT_CLINIC_OR_DEPARTMENT_OTHER): Payer: Self-pay

## 2023-08-26 DIAGNOSIS — Z79899 Other long term (current) drug therapy: Secondary | ICD-10-CM | POA: Insufficient documentation

## 2023-08-26 DIAGNOSIS — R1310 Dysphagia, unspecified: Secondary | ICD-10-CM

## 2023-08-26 DIAGNOSIS — I2510 Atherosclerotic heart disease of native coronary artery without angina pectoris: Secondary | ICD-10-CM | POA: Diagnosis not present

## 2023-08-26 DIAGNOSIS — I10 Essential (primary) hypertension: Secondary | ICD-10-CM | POA: Insufficient documentation

## 2023-08-26 DIAGNOSIS — E119 Type 2 diabetes mellitus without complications: Secondary | ICD-10-CM | POA: Insufficient documentation

## 2023-08-26 DIAGNOSIS — J45909 Unspecified asthma, uncomplicated: Secondary | ICD-10-CM | POA: Diagnosis not present

## 2023-08-26 DIAGNOSIS — B370 Candidal stomatitis: Secondary | ICD-10-CM | POA: Insufficient documentation

## 2023-08-26 LAB — COMPREHENSIVE METABOLIC PANEL
ALT: 19 U/L (ref 0–44)
AST: 21 U/L (ref 15–41)
Albumin: 4.1 g/dL (ref 3.5–5.0)
Alkaline Phosphatase: 95 U/L (ref 38–126)
Anion gap: 8 (ref 5–15)
BUN: 10 mg/dL (ref 6–20)
CO2: 29 mmol/L (ref 22–32)
Calcium: 9.7 mg/dL (ref 8.9–10.3)
Chloride: 102 mmol/L (ref 98–111)
Creatinine, Ser: 0.72 mg/dL (ref 0.44–1.00)
GFR, Estimated: >60 mL/min (ref >60)
Glucose, Bld: 125 mg/dL — ABNORMAL HIGH (ref 70–99)
Potassium: 3.4 mmol/L — ABNORMAL LOW (ref 3.5–5.1)
Sodium: 139 mmol/L (ref 135–145)
Total Bilirubin: 0.7 mg/dL (ref 0.0–1.2)
Total Protein: 8.4 g/dL — ABNORMAL HIGH (ref 6.5–8.1)

## 2023-08-26 LAB — CBC WITH DIFFERENTIAL/PLATELET
Abs Immature Granulocytes: 0.03 10*3/uL (ref 0.00–0.07)
Basophils Absolute: 0 10*3/uL (ref 0.0–0.1)
Basophils Relative: 0 %
Eosinophils Absolute: 0.2 10*3/uL (ref 0.0–0.5)
Eosinophils Relative: 2 %
HCT: 40.5 % (ref 36.0–46.0)
Hemoglobin: 13.2 g/dL (ref 12.0–15.0)
Immature Granulocytes: 0 %
Lymphocytes Relative: 24 %
Lymphs Abs: 2.6 10*3/uL (ref 0.7–4.0)
MCH: 26.6 pg (ref 26.0–34.0)
MCHC: 32.6 g/dL (ref 30.0–36.0)
MCV: 81.5 fL (ref 80.0–100.0)
Monocytes Absolute: 0.9 10*3/uL (ref 0.1–1.0)
Monocytes Relative: 8 %
Neutro Abs: 7 10*3/uL (ref 1.7–7.7)
Neutrophils Relative %: 66 %
Platelets: 366 10*3/uL (ref 150–400)
RBC: 4.97 MIL/uL (ref 3.87–5.11)
RDW: 17.2 % — ABNORMAL HIGH (ref 11.5–15.5)
WBC: 10.8 10*3/uL — ABNORMAL HIGH (ref 4.0–10.5)
nRBC: 0 % (ref 0.0–0.2)

## 2023-08-26 MED ORDER — POSACONAZOLE 40 MG/ML PO SUSP: 400.0000 mg | Freq: Two times a day (BID) | ORAL | 0 refills | Status: DC

## 2023-08-26 MED ORDER — FLUCONAZOLE 40 MG/ML PO SUSR: 200.0000 mg | Freq: Every day | ORAL | 0 refills | Status: AC

## 2023-08-26 NOTE — Telephone Encounter (Signed)
This RN returned call to patient. Patient stated she no longer needed assistance at this time, as she has made contact with Infectious Disease and has an appointment with them for Tuesday. Advised patient to call back with any further needs.   Copied from CRM [id number]. Topic: Clinical - Medical Advice >> Aug 26, 2023  8:24 AM Irine Seal wrote: Reason for CRM: PT was seen in ED 08/25/23 Dx with oral thrush, pt is in the process of TOC from Dr. Olive Bass to Dr. Teryl Lucy Rothfuss, she was last seen virtually with Dr. Dayton Scrape 07/14/23 her upcoming appt to establish care with Dr. Ranae Plumber is 09/06/23, Pt stated she was Fluconazole resistant therefore she requesting a referral for Infectious Disease,  since she has not been seen by Rothfuss yet she is trying to obtain the referral from Dr. Dayton Scrape. Per clinic manager at [facility] [place] the TOC was effective 07/28/23,  Pt would have to be reestablished as a new pt appt with [facility] [place]- only availabilities after her upcoming appt with new PCP, pt verbalized understanding, but is desperate for  medical advice on where she should be seen or what her next steps would be. She is frustrated and asking if she should go to the ED again or what can be done. call back [phone]

## 2023-08-26 NOTE — ED Provider Notes (Signed)
[place] EMERGENCY DEPARTMENT AT [facility] Provider Note   CSN: [id number] Arrival date & time: 08/26/23  1022     History  Chief Complaint  Patient presents with   Sore Throat    thrush    Casey [name] is a 53 y.o. female.   Sore Throat   53 year old female presents to the emergency department with complaints of painful swallowing and white patches on her tongue/mouth.  Patient reports history of esophageal and oral candidiasis of which she has had to be admitted twice to the [facility] health system for IV antifungal agents.  States that current symptoms began yesterday when she noticed white patches on her tongue.  States as the day progressed, noticed some pain with swallowing prompting visit to an urgent care.  Was sent home on fluconazole with ID referral.  States she has an appointment with infectious disease on Tuesday but presented again due to worsening pain with swallowing that occurred overnight.  States that she is still able to tolerate liquids as well as soft foods but it has been painful.  Patient is immunocompromised on Simponi Aria for her RA.  Patient states that she remembers that her fungal culture was resistant to fluconazole when she was in the [place] [facility] system and is requesting different medication.  Past medical history significant for CAD with stent, DVT, hypercholesterolemia, obesity, RA, hypertension, diabetes mellitus type 2, OSA, migraine, asthma, GERD, esophageal candidiasis  Home Medications Prior to Admission medications   Medication Sig Start Date End Date Taking? Authorizing Provider  fluconazole (DIFLUCAN) 40 MG/ML suspension Take 5 mLs (200 mg total) by mouth daily for 7 days. 08/26/23 09/02/23 Yes Sherian Maroon A, PA  acetaminophen (TYLENOL) 500 MG tablet Take 1,000 mg by mouth every 12 (twelve) hours as needed for mild pain or moderate pain.    [provider]  albuterol (PROVENTIL) (2.5 MG/3ML) 0.083% nebulizer solution Take  3 mLs (2.5 mg total) by nebulization every 6 (six) hours as needed for wheezing or shortness of breath. 11/30/22   Olive Bass, FNP  amLODipine (NORVASC) 10 MG tablet Take 1 tablet (10 mg total) by mouth daily. 08/08/23 11/06/23  Georgeanna Lea, MD  atorvastatin (LIPITOR) 80 MG tablet Take 1 tablet (80 mg total) by mouth daily. 07/03/23   Lonia Blood, MD  B Complex-C (B-COMPLEX WITH VITAMIN C) tablet Take 1 tablet by mouth daily. 09/06/19   [provider]  Cholecalciferol (VITAMIN D3) 50 MCG (2000 UT) TABS Take 1 tablet by mouth daily.    [provider]  EPINEPHrine (EPIPEN 2-PAK) 0.3 mg/0.3 mL IJ SOAJ injection Inject 0.3 mg into the muscle as needed for anaphylaxis. 01/18/23   Marcelyn Bruins, MD  esomeprazole (NEXIUM) 40 MG capsule Take 1 capsule (40 mg total) by mouth 2 (two) times daily before a meal. Patient taking differently: Take 80 mg by mouth daily. 11/30/22   Olive Bass, FNP  famotidine (PEPCID) 40 MG tablet Take 1 tablet (40 mg total) by mouth daily. 11/30/22   Olive Bass, FNP  Golimumab [facility] ARIA IV) Inject 200 mg into the vein every 8 (eight) weeks.    [provider]  levocetirizine (XYZAL) 5 MG tablet Take 10 mg by mouth in the morning.    [provider]  linaclotide Karlene Einstein) 72 MCG capsule Take 144 mcg by mouth daily before breakfast. 03/23/23   [provider]  mirabegron ER (MYRBETRIQ) 50 MG TB24 tablet Take 1 tablet (50 mg  total) by mouth daily. 11/30/22   Olive Bass, FNP  montelukast (SINGULAIR) 10 MG tablet Take 1 tablet (10 mg total) by mouth at bedtime. Patient taking differently: Take 10 mg by mouth daily. 11/30/22   Olive Bass, FNP  nitroGLYCERIN (NITROSTAT) 0.4 MG SL tablet Place 1 tablet (0.4 mg total) under the tongue every 5 (five) minutes as needed for chest pain. 10/13/22   Georgeanna Lea, MD  OXYGEN Inhale 2 L into the lungs at bedtime.     [provider]  ticagrelor (BRILINTA) 90 MG TABS tablet Take 1 tablet (90 mg total) by mouth 2 (two) times daily. 07/01/23   Lonia Blood, MD  ZOLMitriptan (ZOMIG) 2.5 MG tablet Take 1 tablet (2.5 mg total) by mouth once for 1 dose. May repeat in 2 hours if headache persists or recurs. Patient taking differently: Take 2.5 mg by mouth daily as needed for migraine. 11/30/22 09/16/23  Olive Bass, FNP      Allergies    Albuterol sulfate, Tape, Formoterol, Hydrocodone, Hydrocodone-acetaminophen, Nickel, Other, Oxycodone-acetaminophen, Salmeterol, Strawberry extract, Advair hfa [fluticasone-salmeterol], Proventil hfa [albuterol], Asa [aspirin], Cleocin [clindamycin], Clindamycin/lincomycin, Fludrocortisone acetate, Gabapentin, Hydrochlorothiazide, Imipramine, Ipratropium bromide, Medroxyprogesterone, Meloxicam, Metformin and related, Nystatin, Penicillins, Pred forte [prednisolone], Prednisone, Solu-medrol [methylprednisolone], Sulfa antibiotics, Tegaderm ag mesh [silver], Toradol [ketorolac tromethamine], and Tramadol    Review of Systems   Review of Systems  All other systems reviewed and are negative.   Physical Exam Updated Vital Signs BP (!) 137/91   Pulse 86   Temp 97.7 F (36.5 C) (Oral)   Resp 16   SpO2 100%  Physical Exam Vitals and nursing note reviewed.  Constitutional:      General: She is not in acute distress.    Appearance: She is well-developed.  HENT:     Head: Normocephalic and atraumatic.     Mouth/Throat:     Comments: Patient with white plaquing appreciated on tongue.  Uvula midline right symmetric with phonation.  Tonsils 1+ benign without obvious exudate.  No sublingual extremity swelling.  No trismus.  White plaque able to be scraped off with tongue depressor.. Eyes:     Conjunctiva/sclera: Conjunctivae normal.  Cardiovascular:     Rate and Rhythm: Normal rate and regular rhythm.     Heart sounds: No murmur heard. Pulmonary:     Effort:  Pulmonary effort is normal. No respiratory distress.     Breath sounds: Normal breath sounds.  Abdominal:     Palpations: Abdomen is soft.     Tenderness: There is no abdominal tenderness.  Musculoskeletal:        General: No swelling.     Cervical back: Neck supple.  Skin:    General: Skin is warm and dry.     Capillary Refill: Capillary refill takes less than 2 seconds.  Neurological:     Mental Status: She is alert.  Psychiatric:        Mood and Affect: Mood normal.     ED Results / Procedures / Treatments   Labs (all labs ordered are listed, but only abnormal results are displayed) Labs Reviewed  CBC WITH DIFFERENTIAL/PLATELET - Abnormal; Notable for the following components:      Result Value   WBC 10.8 (*)    RDW 17.2 (*)    All other components within normal limits  COMPREHENSIVE METABOLIC PANEL - Abnormal; Notable for the following components:   Potassium 3.4 (*)    Glucose, Bld 125 (*)  Total Protein 8.4 (*)    All other components within normal limits    EKG None  Radiology No results found.  Procedures Procedures    Medications Ordered in ED Medications - No data to display  ED Course/ Medical Decision Making/ A&P Clinical Course as of 08/26/23 1405  Fri Aug 26, 2023  1203 Consulted Dr. Luciana Axe of infectious disease who recommended continuation of oral liquid fluconazole.  ID signed off on patient's care in July due to biopsy without evidence of fungal involvement from EGD.  Given the patient has grown out candidiasis susceptible to all azoles in May of last year, will continue liquid fluconazole. [CR]    Clinical Course User Index [CR] Peter Garter, PA                                 Medical Decision Making Amount and/or Complexity of Data Reviewed Labs: ordered.  Risk Prescription drug management.   This patient presents to the ED for concern of fungal infection, this involves an extensive number of treatment options, and is a  complaint that carries with it a high risk of complications and morbidity.  The differential diagnosis includes oral candidiasis, malignancy, esophageal candidiasis, other   Co morbidities that complicate the patient evaluation  See HPI   Additional history obtained:  Additional history obtained from EMR External records from outside source obtained and reviewed including hospital records   Lab Tests:  I Ordered, and personally interpreted labs.  The pertinent results include: Leukocytosis of 10.8.  No evidence of anemia.  Placed within range.  Mild hypokalemia 3.4 but otherwise, tries within normal limits.  No transaminitis.  No renal dysfunction.   Imaging Studies ordered:  N/a   Cardiac Monitoring: / EKG:  The patient was maintained on a cardiac monitor.  I personally viewed and interpreted the cardiac monitored which showed an underlying rhythm of: sinus rhythm   Consultations Obtained:  See ED course  Problem List / ED Course / Critical interventions / Medication management  Odynophagia, oral candidiasis Reevaluation of the patient showed that the patient stayed the same I have reviewed the patients home medicines and have made adjustments as needed   Social Determinants of Health:  Denies tobacco, illicit drug use   Test / Admission - Considered:  Odynophagia, oral candidiasis Vitals signs within normal range and stable throughout visit. Laboratory studies significant for: See above 53 year old female presents emergency department with complaints of painful swallowing as well as white patches on tongue/mouth.  Patient reported history of both oral and esophageal candidiasis accompanying odynophagia requiring admission to the [facility] for IV antifungals.  Per chart review, patient admitted in May as well as July of last year.  Did have biopsy in May significant for candidiasis susceptible to all Azole's with biopsy in July negative for the same  presentation.  Was signed off from an infectious disease standpoint.  Patient reports recurrence of symptoms yesterday with appreciable scrape-able white oral plaque consistent with oral candidiasis.  Was seen yesterday and given fluconazole by urgent care.  Given patient's history as well as 2 admissions before in the past, consulted infectious disease who recommended continuation of patient's already prescribed fluconazole given that she can still tolerate p.o. with follow-up on Tuesday.  Will relay information to patient and give strict return precautions.   Worrisome signs and symptoms were discussed with the patient, and the patient acknowledged understanding  to return to the ED if noticed. Patient was stable upon discharge.          Final Clinical Impression(s) / ED Diagnoses Final diagnoses:  Odynophagia  Oral candidiasis    Rx / DC Orders      Peter Garter, [place] 08/26/23 1405    Durwin Glaze, MD 08/26/23 1416

## 2023-08-26 NOTE — ED Triage Notes (Signed)
Pt c/o patchy white areas to tongue noted yesterday morning; hx esophageal candidiasis, hospitalization for same. Advised that she has resistance to fluconazole.  Appt Tuesday w ID, states "I won't be able to make it through the weekend like this."

## 2023-08-26 NOTE — Discharge Instructions (Addendum)
As discussed, we will change your antifungal medicine. Your last culture was sensitive to both of the posaconazole and the fluconazole medications. Keep your upcoming appointment with ID.  Please do not hesitate to return if the worrisome signs and symptoms we discussed become apparent.

## 2023-08-29 ENCOUNTER — Encounter (HOSPITAL_COMMUNITY): Payer: 59

## 2023-08-30 ENCOUNTER — Encounter: Payer: Self-pay | Admitting: Internal Medicine

## 2023-08-30 ENCOUNTER — Other Ambulatory Visit: Payer: Self-pay

## 2023-08-30 ENCOUNTER — Ambulatory Visit (INDEPENDENT_AMBULATORY_CARE_PROVIDER_SITE_OTHER): Payer: 59 | Admitting: Internal Medicine

## 2023-08-30 ENCOUNTER — Telehealth: Payer: Self-pay

## 2023-08-30 VITALS — BP 129/85 | HR 106 | Resp 16 | Ht 63.0 in | Wt 223.0 lb

## 2023-08-30 DIAGNOSIS — B3781 Candidal esophagitis: Secondary | ICD-10-CM

## 2023-08-30 NOTE — Progress Notes (Unsigned)
RFV:  recurrent thrush  Patient ID: Casey [name], female   DOB: 1971/04/05, 53 y.o.   MRN: [id number]  HPI Hx of recurrent thrush - presumably had fluconazole R - since required IV therapy at her last episode in July  Has gastric sleeve, with recent hiatal hernia repair in oct Last EGD in November shwoing improvement  Difficulty swallowing liquid fluconazole always vomiting  Had recent infusion for seronegative RA--may have been precursor to this recent episdoe.  Symptoms started on thursdya, infusion 4 days prior  Has tegaderm allergy, also fearful of getitng picc line. Previously went to an infusion center daily to get infusion- through [facility]   Outpatient Encounter Medications as of 08/30/2023  Medication Sig   acetaminophen (TYLENOL) 500 MG tablet Take 1,000 mg by mouth every 12 (twelve) hours as needed for mild pain or moderate pain.   albuterol (PROVENTIL) (2.5 MG/3ML) 0.083% nebulizer solution Take 3 mLs (2.5 mg total) by nebulization every 6 (six) hours as needed for wheezing or shortness of breath.   amLODipine (NORVASC) 10 MG tablet Take 1 tablet (10 mg total) by mouth daily.   B Complex-C (B-COMPLEX WITH VITAMIN C) tablet Take 1 tablet by mouth daily.   Cholecalciferol (VITAMIN D3) 50 MCG (2000 UT) TABS Take 1 tablet by mouth daily.   EPINEPHrine (EPIPEN 2-PAK) 0.3 mg/0.3 mL IJ SOAJ injection Inject 0.3 mg into the muscle as needed for anaphylaxis.   esomeprazole (NEXIUM) 40 MG capsule Take 1 capsule (40 mg total) by mouth 2 (two) times daily before a meal. (Patient taking differently: Take 80 mg by mouth daily.)   famotidine (PEPCID) 40 MG tablet Take 1 tablet (40 mg total) by mouth daily.   fluconazole (DIFLUCAN) 40 MG/ML suspension Take 5 mLs (200 mg total) by mouth daily for 7 days.   Golimumab (SIMPONI ARIA IV) Inject 200 mg into the vein every 8 (eight) weeks.   levocetirizine (XYZAL) 5 MG tablet Take 10 mg by mouth in the morning.   linaclotide (LINZESS) 72 MCG  capsule Take 144 mcg by mouth daily before breakfast.   mirabegron ER (MYRBETRIQ) 50 MG TB24 tablet Take 1 tablet (50 mg total) by mouth daily.   montelukast (SINGULAIR) 10 MG tablet Take 1 tablet (10 mg total) by mouth at bedtime. (Patient taking differently: Take 10 mg by mouth daily.)   nitroGLYCERIN (NITROSTAT) 0.4 MG SL tablet Place 1 tablet (0.4 mg total) under the tongue every 5 (five) minutes as needed for chest pain.   oxyCODONE (OXY IR/ROXICODONE) 5 MG immediate release tablet    OXYGEN Inhale 2 L into the lungs at bedtime.   ticagrelor (BRILINTA) 90 MG TABS tablet Take 1 tablet (90 mg total) by mouth 2 (two) times daily.   ZOLMitriptan (ZOMIG) 2.5 MG tablet Take 1 tablet (2.5 mg total) by mouth once for 1 dose. May repeat in 2 hours if headache persists or recurs. (Patient taking differently: Take 2.5 mg by mouth daily as needed for migraine.)   atorvastatin (LIPITOR) 80 MG tablet Take 1 tablet (80 mg total) by mouth daily. (Patient not taking: Reported on 08/30/2023)   No facility-administered encounter medications on file as of 08/30/2023.     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   Coronary artery disease s/p PCI/DES to LAD 07/01/2023 12/28/2022   Precordial chest pain 10/13/2022   Hyperlipidemia 10/07/2022   Hiatal hernia 10/07/2022   Esophageal candidiasis (HCC) 06/26/2022   Urinary tract infection, site not specified 06/26/2022   Ureteral stone 01/20/2022   Encounter for monitoring immunomodulating therapy 11/20/2021   Rheumatoid arthritis of multiple sites with 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 factor (HCC) 11/16/2021   Pyelonephritis 09/25/2021   De Quervain's  tenosynovitis, left 07/31/2021   Contusion of left wrist 07/08/2021   Status post placement of ureteral stent 02/06/2021   Proteinuria 08/12/2020   Environmental allergies 08/12/2020   History of kidney stones 08/12/2020   Tooth infection 08/12/2020   Gastroesophageal reflux disease with esophagitis without hemorrhage 08/01/2019   Slow transit constipation 08/01/2019   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   History of colon polyps 06/19/2019   Irregular astigmatism of both eyes 10/11/2018   Nephrolithiasis 09/08/2018   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   Plantar wart 11/04/2016   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   Myopia with astigmatism and presbyopia 05/07/2016   Hematochezia 03/18/2016   Overactive bladder 11/18/2015   Incontinence 10/07/2015   Albuminuria 06/27/2015   Essential hypertension 06/27/2015   Microhematuria 06/27/2015   Hematuria 06/18/2015   Cataract 05/06/2015   Dry eyes 05/06/2015   Crohn's disease (HCC) 04/21/2015   Osteoarthritis of both knees 04/21/2015   Right knee pain 04/21/2015   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/30/2014   History of abnormal cervical Pap smear 11/29/2014   Fibrositis 08/28/2014   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   Migraine without aura and without status migrainosus, not intractable 08/28/2014   Migraine 08/28/2014   Seronegative inflammatory arthritis 08/28/2014   Increased urinary frequency 07/16/2013   Dysfunction of both eustachian tubes 08/31/2012   Asthma 10/30/2008   Mild intermittent asthma without complication 10/30/2008     Health Maintenance Due  Topic Date Due   Pneumococcal Vaccine 52-63 Years old (1 of 2 - PCV) Never done   FOOT EXAM  Never done   Diabetic kidney evaluation - Urine ACR  Never done   Hepatitis C Screening  Never done   DTaP/Tdap/Td (1 - Tdap) Never done   Zoster Vaccines- Shingrix (1 of 2) Never done   Colonoscopy  Never done   COVID-19 Vaccine (3  - Mixed Product risk series) 07/05/2022   HEMOGLOBIN A1C  06/01/2023     Review of Systems  Physical Exam   BP 129/85   Pulse (!) 106   Resp 16   Ht 5\' 3"  (1.6 m)   Wt 223 lb (101.2 kg)   SpO2 99%   BMI 39.50 kg/m    No results found for: "CD4TCELL" No results found for: "CD4TABS" No results found for: "HIV1RNAQUANT" No results found for: "HEPBSAB" No results found for: "RPR", "LABRPR"  CBC Lab Results  Component Value Date   WBC 10.8 (H) 08/26/2023   RBC 4.97 08/26/2023   HGB 13.2 08/26/2023   HCT 40.5 08/26/2023   PLT 366 08/26/2023   MCV 81.5 08/26/2023   MCH 26.6 08/26/2023   MCHC 32.6 08/26/2023   RDW 17.2 (H) 08/26/2023   LYMPHSABS 2.6 08/26/2023   MONOABS 0.9 08/26/2023   EOSABS 0.2 08/26/2023    BMET Lab Results  Component Value Date   NA 139 08/26/2023   K 3.4 (L) 08/26/2023   CL 102 08/26/2023   CO2 29 08/26/2023   GLUCOSE 125 (H) 08/26/2023  BUN 10 08/26/2023   CREATININE 0.72 08/26/2023   CALCIUM 9.7 08/26/2023   GFRNONAA >60 08/26/2023      Assessment and Plan   Get labs Plan for daily infusion of echinocandin. Plan for 2 wk  Diagnosis: ***  Culture Result: ***  Allergies  Allergen Reactions   Albuterol Sulfate Shortness Of Breath    Other reaction(s): Other (See Comments)  can't breathe  Other reaction(s): Other (See Comments) can't breathe   Tape     Other reaction(s): Other (See Comments), Other (See Comments), Other (See Comments)  severe skin breakdown  Cast and Bandage Cover  severe skin breakdown  severe skin breakdown   Formoterol Other (See Comments)    Other reaction(s): Headaches  Other reaction(s): Headaches  Other reaction(s): Headaches  Headaches  Other reaction(s): Headaches Other reaction(s): Headaches    Other reaction(s): Headaches    Headaches   Hydrocodone Rash   Hydrocodone-Acetaminophen Rash   Nickel Rash and Other (See Comments)   Other Rash    cast and bandage use  cast and bandage  use  cast and bandage use  cast and bandage use    cast and bandage use cast and bandage use   Oxycodone-Acetaminophen Other (See Comments)    headache   Salmeterol Rash   Strawberry Extract Hives    Full body rash from strawberry   Advair Hfa [Fluticasone-Salmeterol]     unknown   Proventil Hfa [Albuterol]     unknown   Asa [Aspirin] Rash   Cleocin [Clindamycin] Rash   Clindamycin/Lincomycin Rash   Fludrocortisone Acetate Rash   Gabapentin Rash   Hydrochlorothiazide Rash   Imipramine Rash   Ipratropium Bromide Rash   Medroxyprogesterone Rash   Meloxicam Rash   Metformin And Related Rash   Nystatin Rash   Penicillins Rash   Pred Forte [Prednisolone] Rash   Prednisone Rash   Solu-Medrol [Methylprednisolone] Rash   Sulfa Antibiotics Rash   Tegaderm Ag Mesh [Silver] Rash   Toradol [Ketorolac Tromethamine] Rash   Tramadol Rash    OPAT Orders Discharge antibiotics to be given via PICC line Discharge antibiotics: Per pharmacy protocol  Aim for Vancomycin trough 15-20 or AUC 400-550 (unless otherwise indicated) Duration: 14 days   [facility] Care Per Protocol:  Infusion center RN for IV administrationand labs.    Labs weekly while on IV antibiotics:   _x_ CMP weekly __ CRP   Fax weekly labs to [phone]  Clinic Follow Up Appt: ***  @

## 2023-08-30 NOTE — Telephone Encounter (Signed)
Infusion Center working to get insurance auth to do Caspofungion Injections M-F at Infusion Clinic since allergic to Tegaderm per Dr. Drue Second. Will await confirmed availability and then contact patient and send details via mychart message per patient request.

## 2023-08-31 ENCOUNTER — Encounter: Payer: Self-pay | Admitting: Internal Medicine

## 2023-08-31 ENCOUNTER — Encounter (HOSPITAL_COMMUNITY): Payer: 59

## 2023-08-31 ENCOUNTER — Telehealth: Payer: Self-pay | Admitting: Pharmacy Technician

## 2023-08-31 ENCOUNTER — Other Ambulatory Visit: Payer: Self-pay | Admitting: Internal Medicine

## 2023-08-31 LAB — COMPREHENSIVE METABOLIC PANEL
AG Ratio: 1 (calc) (ref 1.0–2.5)
ALT: 19 U/L (ref 6–29)
AST: 22 U/L (ref 10–35)
Albumin: 4.2 g/dL (ref 3.6–5.1)
Alkaline phosphatase (APISO): 104 U/L (ref 37–153)
BUN: 11 mg/dL (ref 7–25)
CO2: 24 mmol/L (ref 20–32)
Calcium: 10.3 mg/dL (ref 8.6–10.4)
Chloride: 103 mmol/L (ref 98–110)
Creat: 0.85 mg/dL (ref 0.50–1.03)
Globulin: 4.4 g/dL — ABNORMAL HIGH (ref 1.9–3.7)
Glucose, Bld: 127 mg/dL — ABNORMAL HIGH (ref 65–99)
Potassium: 3.4 mmol/L — ABNORMAL LOW (ref 3.5–5.3)
Sodium: 140 mmol/L (ref 135–146)
Total Bilirubin: 0.5 mg/dL (ref 0.2–1.2)
Total Protein: 8.6 g/dL — ABNORMAL HIGH (ref 6.1–8.1)

## 2023-08-31 LAB — HIV ANTIBODY (ROUTINE TESTING W REFLEX): HIV 1&2 Ab, 4th Generation: NONREACTIVE

## 2023-08-31 NOTE — Telephone Encounter (Signed)
Patient left voicemail requesting update.   Sandie Ano, RN'

## 2023-08-31 NOTE — Telephone Encounter (Signed)
Auth Submission: NO AUTH NEEDED Site of care: Site of care: [facility] Payer: [employer] Medication & CPT/J Code(s) submitted:  CASPOFUNGIN Route of submission (phone, fax, portal): PORTAL Phone # Fax # Auth type: Buy/Bill PB Units/visits requested: 50mg  daily x 10 doses Reference number: [id number] Approval from: 08/31/23 to 01/29/24

## 2023-08-31 NOTE — Telephone Encounter (Addendum)
Infusion will be calling patient once medication arrives to schedule appt. No Berkley Harvey is required. Patient was informed that once med arrives infusion clinic will contact her to schedule daily doses. She was informed that this would not require a hospital admission as she had checked if insurance covers admission incase this med was not found in outpatient setting. [facility] Triage informed.

## 2023-09-01 ENCOUNTER — Ambulatory Visit: Payer: 59

## 2023-09-02 ENCOUNTER — Encounter (HOSPITAL_COMMUNITY): Payer: 59

## 2023-09-02 ENCOUNTER — Encounter (HOSPITAL_BASED_OUTPATIENT_CLINIC_OR_DEPARTMENT_OTHER): Payer: Self-pay

## 2023-09-02 ENCOUNTER — Ambulatory Visit (HOSPITAL_BASED_OUTPATIENT_CLINIC_OR_DEPARTMENT_OTHER)
Admit: 2023-09-02 | Discharge: 2023-09-02 | Disposition: A | Discharge status: Home / Self Care | Payer: 59 | Attending: Internal Medicine | Admitting: Radiology

## 2023-09-02 ENCOUNTER — Ambulatory Visit (HOSPITAL_BASED_OUTPATIENT_CLINIC_OR_DEPARTMENT_OTHER)
Admission: EM | Admit: 2023-09-02 | Discharge: 2023-09-02 | Disposition: A | Discharge status: Home / Self Care | Payer: 59 | Attending: Family Medicine | Admitting: Family Medicine

## 2023-09-02 ENCOUNTER — Ambulatory Visit (INDEPENDENT_AMBULATORY_CARE_PROVIDER_SITE_OTHER): Payer: 59

## 2023-09-02 VITALS — BP 127/80 | HR 81 | Temp 97.6°F | Resp 20

## 2023-09-02 VITALS — BP 128/78 | HR 71 | Temp 98.0°F | Resp 20 | Ht 63.0 in | Wt 221.8 lb

## 2023-09-02 DIAGNOSIS — S8011XA Contusion of right lower leg, initial encounter: Secondary | ICD-10-CM

## 2023-09-02 DIAGNOSIS — B3781 Candidal esophagitis: Secondary | ICD-10-CM

## 2023-09-02 DIAGNOSIS — M7989 Other specified soft tissue disorders: Secondary | ICD-10-CM | POA: Diagnosis not present

## 2023-09-02 DIAGNOSIS — L03115 Cellulitis of right lower limb: Secondary | ICD-10-CM

## 2023-09-02 DIAGNOSIS — M79661 Pain in right lower leg: Secondary | ICD-10-CM | POA: Diagnosis not present

## 2023-09-02 MED ORDER — SODIUM CHLORIDE 0.9 % IV SOLN
50.0000 mg | Freq: Once | INTRAVENOUS | Status: AC
  Administered 2023-09-02: 50 mg via INTRAVENOUS
  Filled 2023-09-02: qty 10

## 2023-09-02 MED ORDER — CEPHALEXIN 500 MG PO CAPS: 1000.0000 mg | ORAL_CAPSULE | Freq: Two times a day (BID) | ORAL | 0 refills | Status: AC

## 2023-09-02 NOTE — ED Provider Notes (Signed)
[name]    CSN: [id number] Arrival date & time: 09/02/23  1520      History   Chief Complaint Chief Complaint  Patient presents with   Leg Injury    Insurance leg on Monday through a laundry cart and it now feels hot to the touch - Entered by patient    HPI Casey [name] is a 53 y.o. female.   Patient was pushing a cart at the laundromat on Monday, 08/29/2023, and the cart, hit something and snapped back and hit her right lower leg.  It has been bruised and there was large lump of swelling that she thought was kind of a hematoma.  That has gone away and she just has some bruising.  But starting today the area is throbbing and feels warm to touch and she is concerned that she might have an infection.  She was seen here on 08/25/2023, for oral thrush.  She has since gotten connected with infectious disease in [place].  She is now on IV antifungals for her thrush.  She started the IVs today.  Patient is worried about a possible osteomyelitis of her right lower leg or even a broken bone.  She has increasing pain in that right lower leg.     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 Atopic dermatitis Formatting of this note might be different from the original. Formatting of this note might be different from the original. Atopic dermatitis Formatting of this note might be different from the original. Formatting of this note might be different from the original. Formatting of this note might be different from the or   Cataract 05/06/2015   Contusion of left wrist 07/08/2021   Coronary artery disease s/p PCI/DES to LAD 07/01/2023 12/28/2022   Crohn's disease (HCC) 04/21/2015   De Quervain's  tenosynovitis, left 07/31/2021   Diabetes mellitus without complication (HCC)    Diabetes type 2, controlled (HCC) 04/21/2015   Dry eyes 05/06/2015   DVT (deep venous thrombosis) (HCC)    Dysfunction of both eustachian tubes 08/31/2012   Encounter for monitoring immunomodulating therapy 11/20/2021   Environmental allergies 08/12/2020   Esophageal candidiasis (HCC) 06/26/2022   Essential hypertension 06/27/2015   Fibrositis 08/28/2014   Formatting of this note might be different from the original. Fibromyalgia Formatting of this note might be different from the original. Formatting of this note might be different from the original. Fibromyalgia   Flank pain 11/24/2020   Gastroesophageal reflux disease with esophagitis without hemorrhage 08/01/2019   Formatting of this note might be different from the original. Formatting of this note might be different from the original. 07/2019: chronic symptoms of esophageal reflux since prior to sleeve gastrectomy surgery. Symptoms have worsened over the last 6 months with recent EGD findings of grade B esophagitis, irregular z line, erythematous mucosa, and evidence of sleeve gastrectomy. 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 Formatting of this note might be different from the  original. 07/2019: 8 mm colonic polyp removed during colonoscopy, biopsy showed serrated polyp. Repeat colonoscopy recommended in one year. Formatting of this note might be different from the original. Added automatically from request for surgery [phone] Formatting of this note might b   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   History of kidney stones 08/12/2020   Hypercholesterolemia 10/07/2022   Hypertension, essential, benign  06/27/2015   Hypertensive disorder 08/28/2014   Formatting of this note might be different from the original. Hypertension Formatting of this note might be different from the original. Formatting of this note might be different from the original. 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 Keratoconus Formatting of this note might be different from the original. Formatting of this note might be different from the original. Keratoconus Formatting of this note might be different from the original. Formatting of this note might be different from the original. Keratoconus Formatting of this note might be different from the or   Microhematuria 06/27/2015   Migraine 08/28/2014   Formatting of this note might be different from the original. Migraine Formatting of this note might be different from the original. Formatting of this note might be different from the original. Migraine Formatting of this note might be different from the original. Migraine Formatting of this note might be different from the original. Migraine Formatting of this note might be different from the or   Migraine without aura and without status migrainosus, not intractable 08/28/2014   Formatting of this note might be different from the original. Formatting of this note might be different from the original. Formatting of this note might be different from the original. Migraine Formatting of this note might be different from the original. Migraine Formatting of this note might be different from the original. Formatting of this note might be different from the original. Migraine F   Mild intermittent asthma without complication 10/30/2008   Formatting of this note might be different from the original. Formatting of this note might be different from the original.  Formatting of this note might be different from the original. Asthma Formatting of this note might be different from the original. Asthma Formatting of this note might be different from the original. Formatting of this note might be different from the original. 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 Formatting of this note might be different from the original. 01/25/2016 with Dr. Dionisio David at [facility]. [place] of this note might be different from the original. 01/25/2016 with Dr. Dionisio David at [facility]. [place] of this note might be different from the original. Formatting of this note might be different from    Seronegative inflammatory arthritis 08/28/2014   Formatting of this note might be  different from the original. Formatting of this note might be different from the original. Arthritis; diagnosed as IBD related - 2012 Formatting of this note might be different from the original. Followed by rheumatologist. Had to change providers due to an insurance change earlier this year resulting in patient not being able to have Remicade for ~6 months. 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 Formatting of this  note might be different from the original. Treated with PRN miralax. Recent colonoscopy 07/2019. Formatting of this note might be different from the original. Treated with PRN miralax. Recent colonoscopy 07/2019. Formatting of this note might be different from the original. 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   Coronary artery disease s/p PCI/DES to LAD 07/01/2023 12/28/2022   Precordial chest pain 10/13/2022   Hyperlipidemia 10/07/2022   Hiatal hernia 10/07/2022   Esophageal candidiasis (HCC) 06/26/2022   Urinary tract infection, site not specified 06/26/2022   Ureteral stone 01/20/2022   Encounter for monitoring immunomodulating therapy 11/20/2021   Rheumatoid arthritis of multiple sites with 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 factor (HCC) 11/16/2021   Pyelonephritis 09/25/2021   De Quervain's tenosynovitis, left 07/31/2021   Contusion of left wrist 07/08/2021   Status post placement of ureteral stent 02/06/2021   Proteinuria 08/12/2020   Environmental allergies 08/12/2020   History of kidney stones 08/12/2020   Tooth infection 08/12/2020   Gastroesophageal reflux disease with esophagitis without hemorrhage 08/01/2019   Slow transit constipation 08/01/2019   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   History of colon polyps 06/19/2019   Irregular astigmatism of both eyes 10/11/2018   Nephrolithiasis 09/08/2018   Organic  sleep related movement disorder 10/07/2017   Plantar wart 11/04/2016   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   Myopia with astigmatism and presbyopia 05/07/2016   Hematochezia 03/18/2016   Overactive bladder 11/18/2015   Incontinence 10/07/2015   Albuminuria 06/27/2015   Essential hypertension 06/27/2015   Microhematuria 06/27/2015   Hematuria 06/18/2015   Cataract 05/06/2015   Dry eyes 05/06/2015   Crohn's disease (HCC) 04/21/2015   Osteoarthritis of both knees 04/21/2015   Right knee pain 04/21/2015   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/30/2014   History of abnormal cervical Pap smear 11/29/2014   Fibrositis 08/28/2014   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   Migraine without aura and without status migrainosus, not intractable 08/28/2014   Migraine 08/28/2014   Seronegative inflammatory arthritis 08/28/2014   Increased urinary frequency 07/16/2013   Dysfunction of both eustachian tubes 08/31/2012   Asthma 10/30/2008   Mild intermittent asthma without complication 10/30/2008    Past Surgical History:  Procedure Laterality Date   BACK SURGERY     L5-S1   CERVICAL CONE BIOPSY  04/2000   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   CORONARY STENT INTERVENTION N/A 07/01/2023   Procedure: CORONARY STENT INTERVENTION;  Surgeon: Iran Ouch,  MD;  Location: [facility];  Service: Cardiovascular;  Laterality: N/A;   EYE SURGERY     GASTRIC BYPASS  2017   [place]   LAPAROSCOPIC GASTRIC SLEEVE RESECTION     LEFT HEART CATH AND CORONARY ANGIOGRAPHY N/A 07/01/2023   Procedure: LEFT HEART CATH AND CORONARY ANGIOGRAPHY;  Surgeon: Iran Ouch, MD;  Location: [facility];  Service: Cardiovascular;  Laterality: N/A;   LEFT HEART CATH AND CORONARY ANGIOGRAPHY N/A 08/01/2023   Procedure: LEFT HEART CATH AND CORONARY ANGIOGRAPHY;  Surgeon: Yates Decamp, MD;  Location: [facility];  Service: Cardiovascular;  Laterality: N/A;   Low back disc surgery     06/2000    TOTAL KNEE ARTHROPLASTY Right 11/2016    OB History     Gravida  0   Para  0   Term  0   Preterm  0   AB  0   Living  0      SAB  0   IAB  0   Ectopic  0   Multiple  0   Live Births  0            Home Medications    Prior to Admission medications   Medication Sig Start Date End Date Taking? Authorizing Provider  amLODipine (NORVASC) 10 MG tablet Take 1 tablet (10 mg total) by mouth daily. 08/08/23 11/06/23 Yes Georgeanna Lea, MD  cephALEXin (KEFLEX) 500 MG capsule Take 2 capsules (1,000 mg total) by mouth 2 (two) times daily after a meal for 7 days. 09/02/23 09/09/23 Yes Prescilla Sours, FNP  esomeprazole (NEXIUM) 40 MG capsule Take 1 capsule (40 mg total) by mouth 2 (two) times daily before a meal. Patient taking differently: Take 80 mg by mouth daily. 11/30/22  Yes Olive Bass, FNP  famotidine (PEPCID) 40 MG tablet Take 1 tablet (40 mg total) by mouth daily. 11/30/22  Yes Olive Bass, FNP  linaclotide [facility]) 72 MCG capsule Take 144 mcg by mouth daily before breakfast. 03/23/23  Yes [provider]  montelukast (SINGULAIR) 10 MG tablet Take 1 tablet (10 mg total) by mouth at bedtime. Patient taking differently: Take 10 mg by mouth daily. 11/30/22  Yes Olive Bass, FNP  ticagrelor (BRILINTA) 90 MG TABS tablet Take 1 tablet (90 mg total) by mouth 2 (two) times daily. 07/01/23  Yes Lonia Blood, MD  acetaminophen (TYLENOL) 500 MG tablet Take 1,000 mg by mouth every 12 (twelve) hours as needed for mild pain or moderate pain.    [provider]  albuterol (PROVENTIL) (2.5 MG/3ML) 0.083% nebulizer solution Take 3 mLs (2.5 mg total) by nebulization every 6 (six) hours as needed for wheezing or shortness of breath. 11/30/22   Olive Bass, FNP  atorvastatin (LIPITOR) 80 MG tablet Take 1 tablet (80 mg total) by mouth daily. Patient not taking: Reported on 08/30/2023 07/03/23   Lonia Blood, MD  B  Complex-C (B-COMPLEX WITH VITAMIN C) tablet Take 1 tablet by mouth daily. 09/06/19   [provider]  Cholecalciferol (VITAMIN D3) 50 MCG (2000 UT) TABS Take 1 tablet by mouth daily.    [provider]  EPINEPHrine (EPIPEN 2-PAK) 0.3 mg/0.3 mL IJ SOAJ injection Inject 0.3 mg into the muscle as needed for anaphylaxis. 01/18/23   Marcelyn Bruins, MD  fluconazole (DIFLUCAN) 40 MG/ML suspension Take 5 mLs (200 mg total) by mouth daily for 7 days. 08/26/23 09/02/23  Peter Garter, PA  Golimumab (SIMPONI ARIA IV) Inject 200 mg into the vein every 8 (eight) weeks.    [provider]  levocetirizine (XYZAL) 5 MG tablet Take 10 mg by mouth in the morning.    [provider]  mirabegron ER (MYRBETRIQ) 50 MG TB24 tablet Take 1 tablet (50 mg total) by mouth daily. 11/30/22   Olive Bass, FNP  nitroGLYCERIN (NITROSTAT) 0.4 MG SL tablet Place 1 tablet (0.4 mg total) under the tongue every 5 (five) minutes as needed for chest pain. 10/13/22   Georgeanna Lea, MD  oxyCODONE (OXY IR/ROXICODONE) 5 MG immediate release tablet     [provider]  OXYGEN Inhale 2 L into the lungs at bedtime.    [provider]  ZOLMitriptan (ZOMIG) 2.5 MG tablet Take 1 tablet (2.5 mg total) by mouth once for 1 dose. May repeat in 2 hours if headache persists or recurs. Patient taking differently: Take 2.5 mg by mouth daily as needed for migraine. 11/30/22 09/16/23  Olive Bass, FNP    Family History Family History  Adopted: Yes  Problem Relation Age of Onset   Diabetes Paternal Grandmother    Diabetes Father    Hypertension Father    Diabetes Mother    Hypertension Mother    Hypertension Brother    Diabetes Brother    Hypertension Sister    Diabetes Sister    Cancer Other    Asthma Neg Hx    Allergic rhinitis Neg Hx    Atopy Neg Hx    Eczema Neg Hx     Social History Social History   Tobacco Use   Smoking status: Never    Passive  exposure: Never   Smokeless tobacco: Never  Vaping Use   Vaping status: Never Used  Substance Use Topics   Alcohol use: Yes    Comment: rare   Drug use: Never     Allergies   Albuterol sulfate, Tape, Formoterol, Hydrocodone, Hydrocodone-acetaminophen, Nickel, Other, Oxycodone-acetaminophen, Salmeterol, Strawberry extract, Advair hfa [fluticasone-salmeterol], Proventil hfa [albuterol], Asa [aspirin], Cleocin [clindamycin], Clindamycin/lincomycin, Fludrocortisone acetate, Gabapentin, Hydrochlorothiazide, Imipramine, Ipratropium bromide, Medroxyprogesterone, Meloxicam, Metformin and related, Nystatin, Penicillins, Pred forte [prednisolone], Prednisone, Solu-medrol [methylprednisolone], Sulfa antibiotics, Tegaderm ag mesh [silver], Toradol [ketorolac tromethamine], and Tramadol   Review of Systems Review of Systems  Constitutional:  Negative for chills and fever.  HENT:  Positive for sore throat. Negative for ear pain.   Eyes:  Negative for pain and visual disturbance.  Respiratory:  Negative for cough and shortness of breath.   Cardiovascular:  Negative for chest pain and palpitations.  Gastrointestinal:  Negative for abdominal pain and vomiting.  Genitourinary:  Negative for dysuria and hematuria.  Musculoskeletal:  Negative for arthralgias and back pain.  Skin:  Positive for wound (Right lower leg with some bruising and a small abraded area.  The site is warm to touch, but no exudate.). Negative for color change and rash.  Neurological:  Negative for seizures and syncope.  All other systems reviewed and are negative.    Physical Exam Triage Vital Signs ED Triage Vitals  Encounter Vitals Group     BP 09/02/23 1559 127/80     Systolic BP Percentile --      Diastolic BP Percentile --      Pulse Rate 09/02/23 1559 81     Resp 09/02/23 1559 20     Temp 09/02/23 1559 97.6 F (36.4 C)     Temp Source 09/02/23 1559 Oral     SpO2  09/02/23 1559 98 %     Weight --      Height --       Head Circumference --      Peak Flow --      Pain Score 09/02/23 1558 7     Pain Loc --      Pain Education --      Exclude from Growth Chart --    No data found.  Updated Vital Signs BP 127/80 (BP Location: Right Arm)   Pulse 81   Temp 97.6 F (36.4 C) (Oral)   Resp 20   SpO2 98%   Visual Acuity Right Eye Distance:   Left Eye Distance:   Bilateral Distance:    Right Eye Near:   Left Eye Near:    Bilateral Near:     Physical Exam Vitals and nursing note reviewed.  Constitutional:      General: She is not in acute distress.    Appearance: She is well-developed. She is not ill-appearing or toxic-appearing.  HENT:     Head: Normocephalic and atraumatic.     Right Ear: External ear normal.     Left Ear: External ear normal.     Nose: Nose normal.     Mouth/Throat:     Tongue: Lesions (White plaque on tongue) present.  Eyes:     Conjunctiva/sclera: Conjunctivae normal.     Pupils: Pupils are equal, round, and reactive to light.  Cardiovascular:     Rate and Rhythm: Normal rate and regular rhythm.     Heart sounds: S1 normal and S2 normal. No murmur heard. Pulmonary:     Effort: Pulmonary effort is normal. No respiratory distress.     Breath sounds: Normal breath sounds. No decreased breath sounds, wheezing, rhonchi or rales.  Abdominal:     General: Abdomen is protuberant.     Palpations: Abdomen is soft.     Tenderness: There is no abdominal tenderness.  Musculoskeletal:        General: No swelling.     Cervical back: Neck supple.  Lymphadenopathy:     Head:     Right side of head: No submental, submandibular, tonsillar, preauricular or posterior auricular adenopathy.     Left side of head: No submental, submandibular, tonsillar, preauricular or posterior auricular adenopathy.     Cervical: No cervical adenopathy.     Right cervical: No superficial cervical adenopathy.    Left cervical: No superficial cervical adenopathy.  Skin:    General: Skin is warm and  dry.     Capillary Refill: Capillary refill takes less than 2 seconds.     Findings: Ecchymosis (Anterior right lower leg with a 4 x 6 inch mid foot area that is bruised.) and wound (Small abrasion on the anterior right lower leg and the lower edge of the area with the ecchymosis.  No exudate.  Minimal erythema.) present.  Neurological:     Mental Status: She is alert and oriented to person, place, and time.  Psychiatric:        Mood and Affect: Mood normal.      UC Treatments / Results  Labs (all labs ordered are listed, but only abnormal results are displayed) Labs Reviewed - No data to display  EKG   Radiology DG Tibia/Fibula Right Result Date: 09/02/2023 CLINICAL DATA:  Right leg pain EXAM: RIGHT TIBIA AND FIBULA - 2 VIEW COMPARISON:  None Available. FINDINGS: Osteopenia. No fracture or dislocation. Total knee arthroplasty seen expected alignment. Mild soft  tissue swelling IMPRESSION: Osteopenia. No acute osseous abnormality. Total knee arthroplasty seen at the edge of the imaging field. Electronically Signed   By: Karen Kays M.D.   On: 09/02/2023 17:01    Procedures Procedures (including critical care time)  Medications Ordered in UC Medications - No data to display  Initial Impression / Assessment and Plan / UC Course  I have reviewed the triage vital signs and the nursing notes.  Pertinent labs & imaging results that were available during my care of the patient were reviewed by me and considered in my medical decision making (see chart for details).  Tib-fib film is negative.  Patient advised that radiology will review it and if there is any difference in the report we will call her otherwise the report will be available on her portal.  Will treat for cellulitis of the right lower leg with cephalexin, 500 mg, number 2 pills, twice daily for 7 days.  Warm compresses to help resolve the hematoma.  Follow-up if symptoms do not improve, worsen, or new symptoms  occur.  Advised that with use of antibiotics she may need her IV antifungals longer than expected for her oral thrush.  Encouraged to speak to her infectious disease physician about this. Final Clinical Impressions(s) / UC Diagnoses   Final diagnoses:  Pain and swelling of right lower leg  Hematoma of right lower leg  Cellulitis of right anterior lower leg     Discharge Instructions      Tibia fibula film appears normal or negative.  No sign of stress fracture.  Warm compresses to help resolve the bruising and hematoma.  Will treat with cephalexin, 500 mg, number 2 pills, twice daily for 7 days for cellulitis of the right lower leg.  Advised to speak to her infectious disease doctor, as she may need longer treatment for the oral thrush with the IV antifungal.  Follow-up if symptoms do not improve, worsen or new symptoms occur.     ED Prescriptions     Medication Sig Dispense Auth. Provider   cephALEXin (KEFLEX) 500 MG capsule Take 2 capsules (1,000 mg total) by mouth 2 (two) times daily after a meal for 7 days. 28 capsule Prescilla Sours, FNP      PDMP not reviewed this encounter.   Prescilla Sours, FNP 09/02/23 [phone]

## 2023-09-02 NOTE — Patient Instructions (Signed)
Caspofungin Injection What is this medication? CASPOFUNGIN (kas poe FUN jin) treats fungal infections. It belongs to a group of medications called antifungals. It will not treat colds, the flu, or infections caused by bacteria or viruses. This medicine may be used for other purposes; ask your health care provider or pharmacist if you have questions. COMMON BRAND NAME(S): Cancidas What should I tell my care team before I take this medication? They need to know if you have any of these conditions: Liver disease An unusual or allergic reaction to caspofungin, other medications, foods, dyes, or preservatives Pregnant or trying to get pregnant Breast-feeding How should I use this medication? This medication is injected into a vein. It is given by your care team in a hospital or clinic setting. Talk to your care team about the use of this medication in children. While this medication may be prescribed for children as young as 3 months for selected conditions, precautions do apply. Overdosage: If you think you have taken too much of this medicine contact a poison control center or emergency room at once. NOTE: This medicine is only for you. Do not share this medicine with others. What if I miss a dose? Keep appointments for follow-up doses. It is important not to miss your dose. Call your care team if you are unable to keep an appointment. What may interact with this medication? Carbamazepine Cyclosporine Dexamethasone Efavirenz Nevirapine Phenytoin Rifampin Tacrolimus This list may not describe all possible interactions. Give your health care provider a list of all the medicines, herbs, non-prescription drugs, or dietary supplements you use. Also tell them if you smoke, drink alcohol, or use illegal drugs. Some items may interact with your medicine. What should I watch for while using this medication? Your condition will be monitored carefully while you are receiving this medication. This  medication may cause serious skin reactions. They can happen weeks to months after starting the medication. Contact your care team right away if you notice fevers or flu-like symptoms with a rash. The rash may be red or purple and then turn into blisters or peeling of the skin. You may also notice a red rash with swelling of the face, lips, or lymph nodes in your neck or under your arms. What side effects may I notice from receiving this medication? Side effects that you should report to your care team as soon as possible: Allergic reactions--skin rash, itching, hives, swelling of the face, lips, tongue, or throat Infusion reactions--chest pain, shortness of breath or trouble breathing, feeling faint or lightheaded Liver injury--right upper belly pain, loss of appetite, nausea, light-colored stool, dark yellow or brown urine, yellowing skin or eyes, unusual weakness or fatigue Redness, blistering, peeling, or loosening of the skin, including inside the mouth Side effects that usually do not require medical attention (report to your care team if they continue or are bothersome): Chills Diarrhea Fever Headache Nausea Stomach pain This list may not describe all possible side effects. Call your doctor for medical advice about side effects. You may report side effects to FDA at 1-800-FDA-1088. Where should I keep my medication? This medication is given in a hospital or clinic. It will not be stored at home. NOTE: This sheet is a summary. It may not cover all possible information. If you have questions about this medicine, talk to your doctor, pharmacist, or health care provider.  2024 [employer] Standard (2021-10-16 00:00:00)

## 2023-09-02 NOTE — ED Triage Notes (Signed)
Pt reports at the laundry mat she was pushing the laundry cart and it came back on her and hit her right shin area of her leg, her leg is bruised, swollen, warm to touch. Bruise noted to right ankle to with swelling

## 2023-09-02 NOTE — Progress Notes (Signed)
Diagnosis: Esophageal candidiasis   Provider:  Chilton Greathouse MD  Procedure: IV Infusion  IV Type: Peripheral, IV Location: L Antecubital  Caspofungin, Dose: 50mg   Infusion Start Time: 0922  Infusion Stop Time: 1041  Post Infusion IV Care: Patient declined observation and Peripheral IV Discontinued  Discharge: Condition: Good, Destination: Home . AVS Declined  Performed by:  Nat Math, RN

## 2023-09-02 NOTE — Discharge Instructions (Addendum)
Tibia fibula film appears normal or negative.  No sign of stress fracture.  Warm compresses to help resolve the bruising and hematoma.  Will treat with cephalexin, 500 mg, number 2 pills, twice daily for 7 days for cellulitis of the right lower leg.  Advised to speak to her infectious disease doctor, as she may need longer treatment for the oral thrush with the IV antifungal.  Follow-up if symptoms do not improve, worsen or new symptoms occur.

## 2023-09-05 ENCOUNTER — Ambulatory Visit: Payer: 59 | Admitting: Cardiology

## 2023-09-05 ENCOUNTER — Ambulatory Visit: Payer: 59 | Attending: Cardiology

## 2023-09-05 ENCOUNTER — Encounter (HOSPITAL_COMMUNITY): Payer: 59

## 2023-09-05 ENCOUNTER — Ambulatory Visit: Payer: 59

## 2023-09-05 VITALS — BP 123/82 | HR 72 | Temp 97.6°F | Resp 20 | Ht 63.0 in | Wt 222.6 lb

## 2023-09-05 DIAGNOSIS — B3781 Candidal esophagitis: Secondary | ICD-10-CM

## 2023-09-05 DIAGNOSIS — I422 Other hypertrophic cardiomyopathy: Secondary | ICD-10-CM | POA: Diagnosis not present

## 2023-09-05 DIAGNOSIS — I3131 Malignant pericardial effusion in diseases classified elsewhere: Secondary | ICD-10-CM

## 2023-09-05 LAB — ECHOCARDIOGRAM COMPLETE
Height: 63 in
S' Lateral: 2.2 cm
Weight: 3561.6 [oz_av]

## 2023-09-05 MED ORDER — SODIUM CHLORIDE 0.9 % IV SOLN
50.0000 mg | Freq: Once | INTRAVENOUS | Status: AC
  Administered 2023-09-05: 50 mg via INTRAVENOUS
  Filled 2023-09-05: qty 10

## 2023-09-05 NOTE — Progress Notes (Signed)
Diagnosis: Esophageal candidiasis   Provider:  Chilton Greathouse MD  Procedure: IV Infusion  IV Type: Peripheral, IV Location: L Antecubital  Caspofungin, Dose: 50mg   Infusion Start Time: 0929  Infusion Stop Time: 1041  Post Infusion IV Care: Peripheral IV Discontinued  Discharge: Condition: Good, Destination: Home . AVS Declined  Performed by:  Garnette Czech, RN

## 2023-09-06 ENCOUNTER — Encounter (HOSPITAL_BASED_OUTPATIENT_CLINIC_OR_DEPARTMENT_OTHER): Payer: Self-pay | Admitting: Student

## 2023-09-06 ENCOUNTER — Ambulatory Visit: Payer: 59

## 2023-09-06 ENCOUNTER — Telehealth: Payer: Self-pay

## 2023-09-06 ENCOUNTER — Ambulatory Visit (HOSPITAL_BASED_OUTPATIENT_CLINIC_OR_DEPARTMENT_OTHER): Payer: 59 | Admitting: Student

## 2023-09-06 VITALS — BP 114/68 | HR 87 | Temp 97.5°F | Resp 18 | Ht 63.0 in | Wt <= 1120 oz

## 2023-09-06 VITALS — BP 145/89 | HR 85 | Temp 98.3°F | Ht 63.39 in | Wt 221.8 lb

## 2023-09-06 DIAGNOSIS — I10 Essential (primary) hypertension: Secondary | ICD-10-CM | POA: Diagnosis not present

## 2023-09-06 DIAGNOSIS — E785 Hyperlipidemia, unspecified: Secondary | ICD-10-CM | POA: Diagnosis not present

## 2023-09-06 DIAGNOSIS — B3781 Candidal esophagitis: Secondary | ICD-10-CM | POA: Diagnosis not present

## 2023-09-06 DIAGNOSIS — I25118 Atherosclerotic heart disease of native coronary artery with other forms of angina pectoris: Secondary | ICD-10-CM

## 2023-09-06 DIAGNOSIS — Z136 Encounter for screening for cardiovascular disorders: Secondary | ICD-10-CM

## 2023-09-06 DIAGNOSIS — R801 Persistent proteinuria, unspecified: Secondary | ICD-10-CM | POA: Diagnosis not present

## 2023-09-06 DIAGNOSIS — Z131 Encounter for screening for diabetes mellitus: Secondary | ICD-10-CM | POA: Diagnosis not present

## 2023-09-06 DIAGNOSIS — Z9884 Bariatric surgery status: Secondary | ICD-10-CM

## 2023-09-06 DIAGNOSIS — J4540 Moderate persistent asthma, uncomplicated: Secondary | ICD-10-CM

## 2023-09-06 DIAGNOSIS — Z7689 Persons encountering health services in other specified circumstances: Secondary | ICD-10-CM

## 2023-09-06 DIAGNOSIS — I200 Unstable angina: Secondary | ICD-10-CM

## 2023-09-06 LAB — POCT URINALYSIS DIPSTICK
Bilirubin, UA: NEGATIVE
Glucose, UA: NEGATIVE
Ketones, UA: NEGATIVE
Leukocytes, UA: NEGATIVE
Nitrite, UA: NEGATIVE
Protein, UA: POSITIVE — AB
Spec Grav, UA: >=1.03 — AB (ref 1.010–1.025)
Urobilinogen, UA: 0.2 U/dL
pH, UA: 5.5 (ref 5.0–8.0)

## 2023-09-06 MED ORDER — CASPOFUNGIN ACETATE 50 MG IV SOLR
50.0000 mg | Freq: Once | INTRAVENOUS | Status: AC
  Administered 2023-09-06: 50 mg via INTRAVENOUS
  Filled 2023-09-06: qty 10

## 2023-09-06 NOTE — Assessment & Plan Note (Signed)
Patient desires referral for nephrology today.  Will place referral.

## 2023-09-06 NOTE — Assessment & Plan Note (Signed)
Stable.  - continue to follow with cardiology

## 2023-09-06 NOTE — Telephone Encounter (Signed)
Patient called, states she was prescribed a 7 day course of Keflex on 1/24 for right leg cellulitis. She was wanting to know if Dr. Drue Second wanted to extend her capsofungin.   Sandie Ano, RN

## 2023-09-06 NOTE — Assessment & Plan Note (Signed)
Stable-continue current regimen.  Patient would not like to increase medication at this time.  Notes that this is followed by cardio.

## 2023-09-06 NOTE — Progress Notes (Signed)
New Patient Office Visit  Subjective    Patient ID: Casey [name], female    DOB: 1971/05/26  Age: 53 y.o. MRN: [id number]  CC:  Chief Complaint  Patient presents with   Establish Care    Here to establish care.   Cellulitis    Still have right leg cellulitis.     HPI Casey [name] presents to establish care. Prior PCP was Allyne Gee at [facility]. Last physical was last April. she notes that she requires refills of no medications.  Proteinuria- Patient notes that she would like a referral to nephrology. Was being seen by a [facility] provider. Notes consistent proteinuria for around 10 years. Patient is only stage 2 CKD. Proteinuria on POC UA today.   Chronic Urolithiasis- Sees Urology at [facility] due to history of urolithiasis.  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   Moderate Persistent Asthma- well controlled. No night time symptoms. Last sleep study revealed low o2 sat at night. Is utillizing 2L of O2 at night. Not needed during the day. Sees pulm and allergist. Has neb for use as needed. Allergic to molecule in inhalers per patient.   Persistent Esophageal Candidiasis- Followed by infectious disease for candidiasis.  R Leg Cellulitis- Seen 09/02/2023 in the urgent care for cellulitis of her R leg.  Was given 7 days of twice daily Keflex. Looks to be improving, patient also feels that it is improving today. Recommended to call clinic if not completely gone with finishing abx.    History of gastric sleeve (2017)- has maintained 100 to 110 lb of weight loss.  Unstable Angina/CAD- Question of possible MI in November? Stent placed in LAD in November. Continues to have palpitations with exercise. Echo yesterday, sees cardiology next week.  Hypertension-  Notes that this is generally well controlled. Patient notes that Cardiologist has generally been okay with her BP readings. Notes that she cannot afford a BP cuff. Lisinopril in the past, notes  that BP would tank. Encouraged patient to take BP at grocery stores.   HLD- On atorvastatin 80mg . Stable- no myalgias or arthralgias.   Screenings:  Colon Cancer: Due for colonoscopy.  Is working with GI. [facility] of colon cancer. Is working to get candidiasis in order before colonoscopy. Lung Cancer: no Breast Cancer: Mammogram in October 2024- clean Diabetes: History of DM. No issue since gastric sleeve. HLD: On atorvastatin 80mg  tab.   The 10-year ASCVD risk score (Arnett DK, et al., 2019) is: 3.1%   Outpatient Encounter Medications as of 09/06/2023  Medication Sig   acetaminophen (TYLENOL) 500 MG tablet Take 1,000 mg by mouth every 12 (twelve) hours as needed for mild pain or moderate pain.   albuterol (PROVENTIL) (2.5 MG/3ML) 0.083% nebulizer solution Take 3 mLs (2.5 mg total) by nebulization every 6 (six) hours as needed for wheezing or shortness of breath.   amLODipine (NORVASC) 10 MG tablet Take 1 tablet (10 mg total) by mouth daily.   atorvastatin (LIPITOR) 80 MG tablet Take 1 tablet (80 mg total) by mouth daily.   B Complex-C (B-COMPLEX WITH VITAMIN C) tablet Take 1 tablet by mouth daily.   cephALEXin (KEFLEX) 500 MG capsule Take 2 capsules (1,000 mg total) by mouth 2 (two) times daily after a meal for 7 days.   Cholecalciferol (VITAMIN D3) 50 MCG (2000 UT) TABS Take 1 tablet by mouth daily.   EPINEPHrine (EPIPEN 2-PAK) 0.3 mg/0.3 mL IJ SOAJ injection Inject 0.3 mg into the  muscle as needed for anaphylaxis.   esomeprazole (NEXIUM) 40 MG capsule Take 1 capsule (40 mg total) by mouth 2 (two) times daily before a meal. (Patient taking differently: Take 80 mg by mouth daily.)   famotidine (PEPCID) 40 MG tablet Take 1 tablet (40 mg total) by mouth daily.   Golimumab (SIMPONI ARIA IV) Inject 200 mg into the vein every 8 (eight) weeks.   levocetirizine (XYZAL) 5 MG tablet Take 10 mg by mouth in the morning.   linaclotide (LINZESS) 145 MCG CAPS capsule Take 145 mcg by mouth daily before  breakfast.   mirabegron ER (MYRBETRIQ) 50 MG TB24 tablet Take 1 tablet (50 mg total) by mouth daily.   montelukast (SINGULAIR) 10 MG tablet Take 1 tablet (10 mg total) by mouth at bedtime. (Patient taking differently: Take 10 mg by mouth daily.)   nitroGLYCERIN (NITROSTAT) 0.4 MG SL tablet Place 1 tablet (0.4 mg total) under the tongue every 5 (five) minutes as needed for chest pain.   oxyCODONE (OXY IR/ROXICODONE) 5 MG immediate release tablet Take 5 mg by mouth as needed. Very rarely   OXYGEN Inhale 2 L into the lungs at bedtime.   ticagrelor (BRILINTA) 90 MG TABS tablet Take 1 tablet (90 mg total) by mouth 2 (two) times daily.   ZOLMitriptan (ZOMIG) 2.5 MG tablet Take 1 tablet (2.5 mg total) by mouth once for 1 dose. May repeat in 2 hours if headache persists or recurs. (Patient taking differently: Take 2.5 mg by mouth daily as needed for migraine.)   No facility-administered encounter medications on file as of 09/06/2023.    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 Atopic dermatitis Formatting of this note might be different from the original. Formatting of this note might be different from the original. Atopic dermatitis Formatting of this note might be different from the original. Formatting of this note might be different from the original. Formatting of this note might be different from the or   Cataract 05/06/2015   Contusion of left wrist 07/08/2021   Coronary artery disease s/p PCI/DES to LAD 07/01/2023 12/28/2022   Crohn's disease (HCC) 04/21/2015   De Quervain's tenosynovitis, left 07/31/2021   Diabetes mellitus without complication (HCC)    Diabetes type 2, controlled (HCC) 04/21/2015   Dry eyes  05/06/2015   DVT (deep venous thrombosis) (HCC)    Dysfunction of both eustachian tubes 08/31/2012   Encounter for monitoring immunomodulating therapy 11/20/2021   Environmental allergies 08/12/2020   Esophageal candidiasis (HCC) 06/26/2022   Essential hypertension 06/27/2015   Fibrositis 08/28/2014   Formatting of this note might be different from the original. Fibromyalgia Formatting of this note might be different from the original. Formatting of this note might be different from the original. Fibromyalgia   Flank pain 11/24/2020   Gastroesophageal reflux disease with esophagitis without hemorrhage 08/01/2019   Formatting of this note might be different from the original. Formatting of this note might be different from the original. 07/2019: chronic symptoms of esophageal reflux since prior to sleeve gastrectomy surgery. Symptoms have worsened over the last 6 months with recent EGD findings of grade B esophagitis, irregular z line, erythematous mucosa, and evidence of sleeve gastrectomy. Biopsies with ch  Glaucoma suspect of both eyes 05/07/2016   Hematochezia 03/18/2016   Hematuria 06/18/2015   Hiatal hernia 10/07/2022   History of abnormal cervical Pap smear 11/29/2014   History of colon polyps 06/19/2019   Formatting of this note might be different from the original. Formatting of this note might be different from the original. 07/2019: 8 mm colonic polyp removed during colonoscopy, biopsy showed serrated polyp. Repeat colonoscopy recommended in one year. Formatting of this note might be different from the original. 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 Hypertension Formatting of this  note might be different from the original. Formatting of this note might be different from the original. 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 Keratoconus Formatting of this note might be different from the original. Formatting of this note might be different from the original. Keratoconus Formatting of this note might be different from the original. Formatting of this note might be different from the original. Keratoconus Formatting of this note might be different from the or   Microhematuria 06/27/2015   Migraine 08/28/2014   Formatting of this note might be different from the original. Migraine Formatting of this note might be different from the original. Formatting of this note might be different from the original. Migraine Formatting of this note might be different from the original. Migraine Formatting of this note might be different from the original. Migraine Formatting of this note might be different from the or   Migraine without aura and without status migrainosus, not intractable 08/28/2014   Formatting of this note might be different from the original. Formatting of this note might be different from the original. Formatting of this note might be different from the original. Migraine Formatting of this note might be different from the original. Migraine Formatting of this note might be different from the original. Formatting of this note might be different from the original. Migraine F   Mild intermittent asthma without complication 10/30/2008   Formatting of this note might be different from the original. Formatting of this note might be different from the original. Formatting of this note might be different from the original. Asthma Formatting of this note might be different from the original. Asthma Formatting of  this note might be different from the original. Formatting of this note might be different from the original. 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 Formatting of this note might be different from the original. 01/25/2016 with Dr.  Dionisio David at [facility]. [place] of this note might be different from the original. 01/25/2016 with Dr. Dionisio David at [facility]. [place] of this note might be different from the original. Formatting of this note might be different from    Seronegative inflammatory arthritis 08/28/2014   Formatting of this note might be different from the original. Formatting of this note might be different from the original. Arthritis; diagnosed as IBD related - 2012 Formatting of this note might be different from the original. Followed by rheumatologist. Had to change providers due to an insurance change earlier this year resulting in patient not being able to have Remicade for ~6 months. 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 Formatting of this note might be different from the original. Treated with PRN miralax. Recent colonoscopy 07/2019. Formatting of this note might be different from the  original. Treated with PRN miralax. Recent colonoscopy 07/2019. Formatting of this note might be different from the original. Formatting of this note might be different f   Status post placement of ureteral stent 02/06/2021   Tooth infection 08/12/2020   Unstable angina (HCC) 07/29/2023   Ureteral stone 01/20/2022   Urinary tract infection, site not specified 06/26/2022    Past Surgical History:  Procedure Laterality Date   BACK SURGERY     L5-S1   CERVICAL CONE BIOPSY  04/2000   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   CORONARY STENT INTERVENTION N/A 07/01/2023   Procedure: CORONARY STENT INTERVENTION;  Surgeon: Iran Ouch, MD;  Location: [facility];  Service: Cardiovascular;  Laterality: N/A;   EYE SURGERY     GASTRIC BYPASS  2017   Sleve   LAPAROSCOPIC GASTRIC SLEEVE RESECTION     LEFT HEART CATH AND CORONARY ANGIOGRAPHY N/A 07/01/2023   Procedure: LEFT HEART CATH AND CORONARY ANGIOGRAPHY;  Surgeon: Iran Ouch, MD;  Location: [facility];  Service: Cardiovascular;  Laterality: N/A;   LEFT HEART CATH AND CORONARY ANGIOGRAPHY N/A 08/01/2023   Procedure: LEFT HEART CATH AND CORONARY ANGIOGRAPHY;  Surgeon: Yates Decamp, MD;  Location: [facility];  Service: Cardiovascular;  Laterality: N/A;   Low back disc surgery     06/2000   TOTAL KNEE ARTHROPLASTY Right 11/2016    Family History  Adopted: Yes  Problem Relation Age of Onset   Diabetes Mother    Hypertension Mother    Diabetes Father    Hypertension Father    Hypertension Sister    Diabetes Sister    Hypertension Brother    Diabetes Brother    Diabetes Paternal Grandmother    Cancer Other    Asthma Neg Hx    Allergic rhinitis Neg Hx    Atopy Neg Hx    Eczema Neg Hx     Social History   Socioeconomic History   Marital status: Married    Spouse name: Not on file   Number of children: 0   Years of education: Not on file   Highest education level: Professional school degree (e.g., MD,  DDS, DVM, JD)  Occupational History   Not on file  Tobacco Use   Smoking status: Never    Passive exposure: Never   Smokeless tobacco: Never  Vaping Use   Vaping status: Never Used  Substance and Sexual Activity   Alcohol use: Yes    Comment: rare   Drug use: Never  Sexual activity: Yes  Other Topics Concern   Not on file  Social History Narrative   2 dogs    Social Drivers of Health   Financial Resource Strain: High Risk (07/14/2023)   Overall Financial Resource Strain (CARDIA)    Difficulty of Paying Living Expenses: Hard  Food Insecurity: No Food Insecurity (07/30/2023)   Hunger Vital Sign    Worried About Running Out of Food in the Last Year: Never true    Ran Out of Food in the Last Year: Never true  Transportation Needs: No Transportation Needs (07/30/2023)   PRAPARE - Administrator, Civil Service (Medical): No    Lack of Transportation (Non-Medical): No  Physical Activity: Unknown (07/14/2023)   Exercise Vital Sign    Days of Exercise per Week: 0 days    Minutes of Exercise per Session: Not on file  Stress: No Stress Concern Present (07/14/2023)   [employer] of Occupational Health - Occupational Stress Questionnaire    Feeling of Stress : Not at all  Social Connections: Moderately Isolated (07/14/2023)   Social Connection and Isolation Panel [NHANES]    Frequency of Communication with Friends and Family: More than three times a week    Frequency of Social Gatherings with Friends and Family: Once a week    Attends Religious Services: Never    Database administrator or Organizations: No    Attends Engineer, structural: Not on file    Marital Status: Married  Catering manager Violence: Not At Risk (07/30/2023)   Humiliation, Afraid, Rape, and Kick questionnaire    Fear of Current or Ex-Partner: No    Emotionally Abused: No    Physically Abused: No    Sexually Abused: No    ROS  Per HPI      Objective    BP (!) 145/89 (BP  Location: Left Arm, Patient Position: Sitting, Cuff Size: Normal)   Pulse 85   Temp 98.3 F (36.8 C) (Oral)   Ht 5' 3.39" (1.61 m)   Wt 221 lb 12.8 oz (100.6 kg)   SpO2 99%   BMI 38.81 kg/m   Physical Exam Constitutional:      General: She is not in acute distress.    Appearance: Normal appearance. She is not ill-appearing.  HENT:     Head: Normocephalic and atraumatic.     Nose: Nose normal.     Mouth/Throat:     Mouth: Mucous membranes are moist.     Pharynx: Oropharynx is clear.  Eyes:     General: No scleral icterus.    Extraocular Movements: Extraocular movements intact.     Conjunctiva/sclera: Conjunctivae normal.     Pupils: Pupils are equal, round, and reactive to light.  Neck:     Vascular: No carotid bruit.  Cardiovascular:     Rate and Rhythm: Normal rate and regular rhythm.     Pulses: Normal pulses.     Heart sounds: Normal heart sounds. No murmur heard.    No friction rub.  Pulmonary:     Effort: Pulmonary effort is normal. No respiratory distress.     Breath sounds: Normal breath sounds. No wheezing, rhonchi or rales.  Musculoskeletal:        General: Normal range of motion.     Cervical back: Neck supple.     Right lower leg: No edema.     Left lower leg: No edema.  Skin:    General: Skin is warm and dry.  Coloration: Skin is not jaundiced or pale.  Neurological:     General: No focal deficit present.     Mental Status: She is alert.  Psychiatric:        Mood and Affect: Mood normal.        Behavior: Behavior normal.         Assessment & Plan:   Encounter to establish care  Persistent proteinuria Assessment & Plan: Patient desires referral for nephrology today.  Will place referral.   Coronary artery disease of native artery of native heart with stable angina pectoris (HCC) -     POCT urinalysis dipstick  Unstable angina [facility]) Assessment & Plan: Stable-continue to follow with cardiology.   Moderate persistent asthma without  complication Assessment & Plan: Stable-continue to follow with pulmonology.  Continue to utilize 2 L of O2 nightly.   Esophageal candidiasis (HCC) Assessment & Plan: Gets IV antifungals for persistent esophageal candidiasis.  Continue to follow with infectious disease.   Screening for cardiovascular condition -     Lipid panel  Screening for diabetes mellitus -     Hemoglobin A1c  S/P laparoscopic sleeve gastrectomy  Essential hypertension Assessment & Plan: Stable-continue current regimen.  Patient would not like to increase medication at this time.  Notes that this is followed by cardio.   Hyperlipidemia, unspecified hyperlipidemia type Assessment & Plan: Stable-continue current regimen.  Will reassess lipid panel today.   I have spent greater than 60 minutes charting, educating, diagnosing and managing this patient for this visit.   Return in about 6 weeks (around 10/18/2023) for Chronic Followup.   Teryl Lucy Chais Fehringer, PA-C

## 2023-09-06 NOTE — Progress Notes (Signed)
Diagnosis:   Esophageal candidiasis    Provider:  Chilton Greathouse MD  Procedure: IV Infusion  IV Type: Peripheral, IV Location: L Antecubital  caspofungin, Dose: 50mg    Infusion Start Time: 1338  Infusion Stop Time: 1444  Post Infusion IV Care: Peripheral IV Discontinued  Discharge: Condition: Good, Destination: Home . AVS Declined  Performed by:  Loney Hering, LPN

## 2023-09-06 NOTE — Assessment & Plan Note (Signed)
Gets IV antifungals for persistent esophageal candidiasis.  Continue to follow with infectious disease.

## 2023-09-06 NOTE — Patient Instructions (Signed)
It was nice to see you today!  I will let you know your results. Please keep [place] in the loop for any of your upcoming specialist visits.  If you have any problems before your next visit feel free to message me via MyChart (minor issues or questions) or call the office, otherwise you may reach out to schedule an office visit.  Thank you! Gerilyn Pilgrim Gwendolin Briel, PA-C

## 2023-09-06 NOTE — Assessment & Plan Note (Signed)
Stable-continue to follow with pulmonology.  Continue to utilize 2 L of O2 nightly.

## 2023-09-06 NOTE — Assessment & Plan Note (Signed)
Stable-continue current regimen.  Will reassess lipid panel today.

## 2023-09-07 ENCOUNTER — Ambulatory Visit (INDEPENDENT_AMBULATORY_CARE_PROVIDER_SITE_OTHER): Payer: 59

## 2023-09-07 ENCOUNTER — Encounter (HOSPITAL_COMMUNITY): Payer: 59

## 2023-09-07 ENCOUNTER — Ambulatory Visit: Payer: 59

## 2023-09-07 ENCOUNTER — Encounter (HOSPITAL_BASED_OUTPATIENT_CLINIC_OR_DEPARTMENT_OTHER): Payer: Self-pay | Admitting: Student

## 2023-09-07 VITALS — BP 135/75 | HR 97 | Temp 97.9°F | Resp 18 | Ht 63.0 in | Wt 222.8 lb

## 2023-09-07 DIAGNOSIS — B3781 Candidal esophagitis: Secondary | ICD-10-CM | POA: Diagnosis not present

## 2023-09-07 LAB — LIPID PANEL
Chol/HDL Ratio: 3.4 {ratio} (ref 0.0–4.4)
Cholesterol, Total: 249 mg/dL — ABNORMAL HIGH (ref 100–199)
HDL: 73 mg/dL (ref >39)
LDL Chol Calc (NIH): 140 mg/dL — ABNORMAL HIGH (ref 0–99)
Triglycerides: 205 mg/dL — ABNORMAL HIGH (ref 0–149)
VLDL Cholesterol Cal: 36 mg/dL (ref 5–40)

## 2023-09-07 LAB — HEMOGLOBIN A1C
Est. average glucose Bld gHb Est-mCnc: 126 mg/dL
Hgb A1c MFr Bld: 6 % — ABNORMAL HIGH (ref 4.8–5.6)

## 2023-09-07 MED ORDER — CASPOFUNGIN ACETATE 50 MG IV SOLR
50.0000 mg | Freq: Once | INTRAVENOUS | Status: AC
  Administered 2023-09-07: 50 mg via INTRAVENOUS
  Filled 2023-09-07: qty 10

## 2023-09-07 NOTE — Progress Notes (Signed)
Diagnosis: Esophageal candidiasis  Provider:  Chilton Greathouse MD  Procedure: IV Infusion  IV Type: Peripheral, IV Location: L Antecubital  Cancidas (caspofungin), Dose: 50 mg  Infusion Start Time: 1521  Infusion Stop Time: 1632  Post Infusion IV Care:  IV left in placed, flushed.  Dressing reinforced  Discharge: Condition: Good, Destination: Home . AVS Declined  Performed by:  Wyvonne Lenz, RN

## 2023-09-08 ENCOUNTER — Ambulatory Visit (HOSPITAL_COMMUNITY)
Admission: RE | Admit: 2023-09-08 | Discharge: 2023-09-08 | Disposition: A | Discharge status: Home / Self Care | Payer: 59 | Source: Ambulatory Visit | Attending: Internal Medicine | Admitting: Internal Medicine

## 2023-09-08 ENCOUNTER — Other Ambulatory Visit: Payer: Self-pay

## 2023-09-08 ENCOUNTER — Ambulatory Visit: Payer: 59

## 2023-09-08 ENCOUNTER — Other Ambulatory Visit: Payer: Self-pay | Admitting: Internal Medicine

## 2023-09-08 VITALS — BP 129/76 | HR 80 | Temp 98.5°F | Resp 20 | Ht 63.0 in | Wt 223.0 lb

## 2023-09-08 DIAGNOSIS — B3781 Candidal esophagitis: Secondary | ICD-10-CM | POA: Diagnosis not present

## 2023-09-08 DIAGNOSIS — M79602 Pain in left arm: Secondary | ICD-10-CM

## 2023-09-08 DIAGNOSIS — T82898S Other specified complication of vascular prosthetic devices, implants and grafts, sequela: Secondary | ICD-10-CM

## 2023-09-08 DIAGNOSIS — T82898A Other specified complication of vascular prosthetic devices, implants and grafts, initial encounter: Secondary | ICD-10-CM

## 2023-09-08 DIAGNOSIS — Z86718 Personal history of other venous thrombosis and embolism: Secondary | ICD-10-CM

## 2023-09-08 MED ORDER — SODIUM CHLORIDE 0.9 % IV SOLN
50.0000 mg | Freq: Once | INTRAVENOUS | Status: AC
  Administered 2023-09-08: 50 mg via INTRAVENOUS
  Filled 2023-09-08: qty 10

## 2023-09-08 NOTE — Progress Notes (Signed)
Patient receiving daily caspofungin for esophageal candidiasis. She is also difficult venous access. She had piv placed on 1/29 and kept in place in the left [facility]. Today she complains of pain to bicep that feels like dvt (where she has had in the past?)   Though she is on anticoagulation, I feel this would be unlikely, but patient is concerned/anxious. Will get U/S to ensure no DVT, if so, will treat accordingly. Ask rn to give recs on managing phlebitis as well.  Placed order to rule out dvt in upper arm of left arm. Spoke with Bayard Hugger at infusion center to relay appt to patient. Our office will arrange appt today

## 2023-09-08 NOTE — Progress Notes (Signed)
Diagnosis: Esophageal candidiasis   Provider:  Chilton Greathouse MD  Procedure: IV Infusion  IV Type: Peripheral, IV Location: R Forearm  Cancidas (caspofungin), Dose: 50 mg  Infusion Start Time: 1019  Infusion Stop Time: 1125  Post Infusion IV Care: Patient declined observation and Peripheral IV Discontinued  Discharge: Condition: Stable, Destination: Home . AVS Provided. This RN reviewed patient education information on phlebitis with patient that was included with AVS. Patient verbalized understanding and had no additional questions.  Performed by:  Wyvonne Lenz, RN

## 2023-09-09 ENCOUNTER — Emergency Department (HOSPITAL_BASED_OUTPATIENT_CLINIC_OR_DEPARTMENT_OTHER)
Admission: EM | Admit: 2023-09-09 | Discharge: 2023-09-09 | Disposition: A | Discharge status: Home / Self Care | Payer: 59 | Attending: Emergency Medicine | Admitting: Emergency Medicine

## 2023-09-09 ENCOUNTER — Other Ambulatory Visit: Payer: Self-pay

## 2023-09-09 ENCOUNTER — Encounter (HOSPITAL_COMMUNITY): Payer: 59

## 2023-09-09 ENCOUNTER — Encounter (HOSPITAL_BASED_OUTPATIENT_CLINIC_OR_DEPARTMENT_OTHER): Payer: Self-pay | Admitting: Urology

## 2023-09-09 ENCOUNTER — Ambulatory Visit: Payer: 59

## 2023-09-09 ENCOUNTER — Ambulatory Visit (HOSPITAL_BASED_OUTPATIENT_CLINIC_OR_DEPARTMENT_OTHER): Payer: Self-pay | Admitting: Student

## 2023-09-09 ENCOUNTER — Ambulatory Visit
Admission: RE | Admit: 2023-09-09 | Discharge: 2023-09-09 | Disposition: A | Discharge status: Home / Self Care | Payer: 59 | Source: Ambulatory Visit | Attending: Student | Admitting: Student

## 2023-09-09 ENCOUNTER — Emergency Department (HOSPITAL_BASED_OUTPATIENT_CLINIC_OR_DEPARTMENT_OTHER): Payer: 59

## 2023-09-09 VITALS — BP 136/85 | HR 89 | Temp 98.2°F | Resp 22 | Ht 63.0 in | Wt 224.0 lb

## 2023-09-09 VITALS — BP 137/85 | HR 92 | Temp 98.1°F | Resp 17

## 2023-09-09 DIAGNOSIS — L03115 Cellulitis of right lower limb: Secondary | ICD-10-CM | POA: Insufficient documentation

## 2023-09-09 DIAGNOSIS — E11628 Type 2 diabetes mellitus with other skin complications: Secondary | ICD-10-CM | POA: Diagnosis not present

## 2023-09-09 DIAGNOSIS — M7989 Other specified soft tissue disorders: Secondary | ICD-10-CM | POA: Diagnosis present

## 2023-09-09 DIAGNOSIS — I2510 Atherosclerotic heart disease of native coronary artery without angina pectoris: Secondary | ICD-10-CM | POA: Insufficient documentation

## 2023-09-09 DIAGNOSIS — L03116 Cellulitis of left lower limb: Secondary | ICD-10-CM | POA: Insufficient documentation

## 2023-09-09 DIAGNOSIS — J45909 Unspecified asthma, uncomplicated: Secondary | ICD-10-CM | POA: Diagnosis not present

## 2023-09-09 DIAGNOSIS — E876 Hypokalemia: Secondary | ICD-10-CM | POA: Diagnosis not present

## 2023-09-09 DIAGNOSIS — E119 Type 2 diabetes mellitus without complications: Secondary | ICD-10-CM | POA: Insufficient documentation

## 2023-09-09 DIAGNOSIS — B3781 Candidal esophagitis: Secondary | ICD-10-CM

## 2023-09-09 LAB — TROPONIN I (HIGH SENSITIVITY): Troponin I (High Sensitivity): 3 ng/L (ref <18)

## 2023-09-09 LAB — CBC WITH DIFFERENTIAL/PLATELET
Abs Immature Granulocytes: 0.04 10*3/uL (ref 0.00–0.07)
Basophils Absolute: 0.1 10*3/uL (ref 0.0–0.1)
Basophils Relative: 1 %
Eosinophils Absolute: 0.2 10*3/uL (ref 0.0–0.5)
Eosinophils Relative: 1 %
HCT: 37.1 % (ref 36.0–46.0)
Hemoglobin: 12.2 g/dL (ref 12.0–15.0)
Immature Granulocytes: 0 %
Lymphocytes Relative: 28 %
Lymphs Abs: 3.5 10*3/uL (ref 0.7–4.0)
MCH: 26.6 pg (ref 26.0–34.0)
MCHC: 32.9 g/dL (ref 30.0–36.0)
MCV: 81 fL (ref 80.0–100.0)
Monocytes Absolute: 1 10*3/uL (ref 0.1–1.0)
Monocytes Relative: 8 %
Neutro Abs: 7.5 10*3/uL (ref 1.7–7.7)
Neutrophils Relative %: 62 %
Platelets: 371 10*3/uL (ref 150–400)
RBC: 4.58 MIL/uL (ref 3.87–5.11)
RDW: 16.2 % — ABNORMAL HIGH (ref 11.5–15.5)
WBC: 12.2 10*3/uL — ABNORMAL HIGH (ref 4.0–10.5)
nRBC: 0 % (ref 0.0–0.2)

## 2023-09-09 LAB — URINALYSIS, MICROSCOPIC (REFLEX): Bacteria, UA: NONE SEEN

## 2023-09-09 LAB — URINALYSIS, ROUTINE W REFLEX MICROSCOPIC
Bilirubin Urine: NEGATIVE
Glucose, UA: NEGATIVE mg/dL
Ketones, ur: NEGATIVE mg/dL
Leukocytes,Ua: NEGATIVE
Nitrite: NEGATIVE
Protein, ur: 30 mg/dL — AB
Specific Gravity, Urine: 1.025 (ref 1.005–1.030)
pH: 5.5 (ref 5.0–8.0)

## 2023-09-09 LAB — COMPREHENSIVE METABOLIC PANEL
ALT: 20 U/L (ref 0–44)
AST: 27 U/L (ref 15–41)
Albumin: 3.6 g/dL (ref 3.5–5.0)
Alkaline Phosphatase: 95 U/L (ref 38–126)
Anion gap: 8 (ref 5–15)
BUN: 10 mg/dL (ref 6–20)
CO2: 26 mmol/L (ref 22–32)
Calcium: 9.1 mg/dL (ref 8.9–10.3)
Chloride: 104 mmol/L (ref 98–111)
Creatinine, Ser: 0.77 mg/dL (ref 0.44–1.00)
GFR, Estimated: >60 mL/min (ref >60)
Glucose, Bld: 117 mg/dL — ABNORMAL HIGH (ref 70–99)
Potassium: 2.8 mmol/L — ABNORMAL LOW (ref 3.5–5.1)
Sodium: 138 mmol/L (ref 135–145)
Total Bilirubin: 0.5 mg/dL (ref 0.0–1.2)
Total Protein: 8.2 g/dL — ABNORMAL HIGH (ref 6.5–8.1)

## 2023-09-09 LAB — BRAIN NATRIURETIC PEPTIDE: B Natriuretic Peptide: 57 pg/mL (ref 0.0–100.0)

## 2023-09-09 LAB — MAGNESIUM: Magnesium: 1.8 mg/dL (ref 1.7–2.4)

## 2023-09-09 MED ORDER — CEFTRIAXONE SODIUM 1 G IJ SOLR
1.0000 g | Freq: Once | INTRAMUSCULAR | Status: AC
  Administered 2023-09-09: 1 g via INTRAVENOUS
  Filled 2023-09-09: qty 10

## 2023-09-09 MED ORDER — DOXYCYCLINE HYCLATE 100 MG PO CAPS: 100.0000 mg | ORAL_CAPSULE | Freq: Two times a day (BID) | ORAL | 0 refills | Status: DC

## 2023-09-09 MED ORDER — POTASSIUM CHLORIDE 10 MEQ/100ML IV SOLN
10.0000 meq | Freq: Once | INTRAVENOUS | Status: AC
  Administered 2023-09-09: 10 meq via INTRAVENOUS
  Filled 2023-09-09: qty 100

## 2023-09-09 MED ORDER — SODIUM CHLORIDE 0.9 % IV SOLN
50.0000 mg | Freq: Once | INTRAVENOUS | Status: AC
  Administered 2023-09-09: 50 mg via INTRAVENOUS
  Filled 2023-09-09: qty 10

## 2023-09-09 MED ORDER — OXYCODONE HCL 5 MG PO TABS: 5.0000 mg | ORAL_TABLET | ORAL | 0 refills | Status: DC | PRN

## 2023-09-09 MED ORDER — POTASSIUM CHLORIDE CRYS ER 20 MEQ PO TBCR
40.0000 meq | EXTENDED_RELEASE_TABLET | Freq: Once | ORAL | Status: DC
  Filled 2023-09-09: qty 2

## 2023-09-09 NOTE — ED Triage Notes (Signed)
Pt sent from UC Dx with cellulitis in right leg and placed on antibiotics 1 week ago, leg swelling was going down, started to get worse today Noticed left leg started swelling 1 hr pta  Low grade fever at time of triage

## 2023-09-09 NOTE — ED Notes (Signed)
 Ultrasound at bedside

## 2023-09-09 NOTE — ED Notes (Signed)
Patient is being discharged from the Urgent Care and sent to the Emergency Department via POV . Per Reita May, FNP, patient is in need of higher level of care due to bilateral leg swelling. Patient is aware and verbalizes understanding of plan of care.  Vitals:   09/09/23 1738  BP: 137/85  Pulse: 92  Resp: 17  Temp: 98.1 F (36.7 C)  SpO2: 97%

## 2023-09-09 NOTE — Telephone Encounter (Signed)
  Chief Complaint: leg cellulitis Symptoms: red, warm, swollen, painful Frequency: x 1 week Pertinent Negatives: Patient denies fever, weakness Disposition: [] ED /[x] Urgent Care (no appt availability in office) / [] Appointment(In office/virtual)/ []  [facility]/ [] Home Care/ [] Refused Recommended Disposition /[] [facility]/ []  Follow-up with PCP Additional Notes: Patient c/o worsening cellulitis of R leg x 1 week. Went to UC and was given abx, and has 1 dose left with no improvement of sx. Denies fever, weakness. Endorses pain to touch, when walking, and increased swelling, redness and warmth. Scheduled patient per protocol on 09/09/2023 at [facility]. Patient verbalized understanding.     Copied from CRM [id number]. Topic: Clinical - Red Word Triage >> Sep 09, 2023  4:08 PM Maxwell Marion wrote: Red Word that prompted transfer to Nurse Triage: cellulitis and swelling in right leg Reason for Disposition  [1] Taking antibiotic > 24 hours AND [2] cellulitis symptoms are WORSE (e.g., spreading redness, pain, swelling)  Answer Assessment - Initial Assessment Questions 1. SYMPTOM: "What's the main symptom you're concerned about?" (e.g., redness, swelling, pain, fever, weakness)     Leg is starting to feel hot, bright red, painful again - has 1 dose left of abx 2. CELLULITIS LOCATION: "Where is the cellulitis located?" (e.g., hand, arm, foot, leg, face)     R leg 3. CELLULITIS SIZE: "What is the size of the red area?" (e.g., inches, centimeters; compare to size of a coin) .     Foot to just below knee 4. BETTER-SAME-WORSE: "Are you getting better, staying the same, or getting worse compared to the day you started the antibiotics?"      worse 5. PAIN: Do you have any pain?"  If Yes, ask: "How bad is the pain?"  (e.g., Scale 1-10; mild, moderate, or severe)    - MILD (1-3): Doesn't interfere with normal activities.     - MODERATE (4-7): Interferes with normal activities or  awakens from sleep.    - SEVERE (8-10): Excruciating pain, unable to do any normal activities.       6/10 6. FEVER: "Do you have a fever?" If Yes, ask: "What is it, how was it measured and when did it start?"     Low grade fevers 7. OTHER SYMPTOMS: "Do you have any other symptoms?" (e.g., pus coming from a wound, red streaks, weakness)     Red undertone, brown spot at site of injury 8. DIAGNOSIS DATE: "When was the cellulitis diagnosed?" "By whom?"      A week ago 9. ANTIBIOTIC NAME: "What antibiotic(s) are you taking?"  "How many times per day?" (Be sure the patient is receiving the antibiotic as directed).      keflex 10. ANTIBIOTIC DATE: "When was the antibiotic started?"       1/24 11. FOLLOW-UP APPOINTMENT: "Do you have follow-up appointment with your doctor?"       no  Protocols used: Cellulitis on Antibiotic Follow-up Call-[facility]

## 2023-09-09 NOTE — ED Triage Notes (Signed)
Pt presents with bilateral leg swelling. States right leg began swelling after she hit it with laundry cart last week. She has been on abx since 09/02/23. Today she woke up with right leg red and painful and left leg swelling.

## 2023-09-09 NOTE — ED Provider Notes (Signed)
[name]    CSN: [id number] Arrival date & time: 09/09/23  1710      History   Chief Complaint Chief Complaint  Patient presents with   Leg Swelling    Entered by patient    HPI Casey [name] is a 53 y.o. female.   Casey [name] is a 53 y.o. female presenting for evaluation of recent injury and cellulitis to the right anterior shin. She struck her shin on her laundry cart 10 days ago and has been taking cephalexin antibiotic for the last 7 days without missed doses to treat cellulitis associated with wound.  States cephalexin was helping initially, however she started noticing worsening redness and swelling to the right lower extremity today.  She additionally has noticed redness and swelling/warmth to the left lower extremity that started this afternoon and has become progressively worse throughout the last few hours.  No recent reinjuries of the legs.  Denies recent orthopnea or history of CHF/leg swelling at baseline.  She admits she has been slightly more short of breath with daily activities in the last 5 to 7 days, otherwise denies chest pain, nausea, vomiting, fever, chills, and bodyaches.  History of type 2 diabetes and CAD. Sugars are well controlled. History of DVT. Takes Brilinta due to history of CAD and recent drug-eluting stent to the LAD in November 2024.  She has been applying ice to the legs without relief. Multiple drug allergies.      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 Atopic dermatitis Formatting of this note might be different from the original. Formatting of this note might be different from the original. Atopic dermatitis Formatting of this note might be different  from the original. Formatting of this note might be different from the original. Formatting of this note might be different from the or   Cataract 05/06/2015   Contusion of left wrist 07/08/2021   Coronary artery disease s/p PCI/DES to LAD 07/01/2023 12/28/2022   Crohn's disease (HCC) 04/21/2015   De Quervain's tenosynovitis, left 07/31/2021   Diabetes mellitus without complication (HCC)    Diabetes type 2, controlled (HCC) 04/21/2015   Dry eyes 05/06/2015   DVT (deep venous thrombosis) (HCC)    Dysfunction of both eustachian tubes 08/31/2012   Encounter for monitoring immunomodulating therapy 11/20/2021   Environmental allergies 08/12/2020   Esophageal candidiasis (HCC) 06/26/2022   Essential hypertension 06/27/2015   Fibrositis 08/28/2014   Formatting of this note might be different from the original. Fibromyalgia Formatting of this note might be different from the original. Formatting of this note might be different from the original. Fibromyalgia   Flank pain 11/24/2020   Gastroesophageal reflux disease with esophagitis without hemorrhage 08/01/2019   Formatting of this note might be different from the original. Formatting of this note might be different from the original. 07/2019: chronic symptoms of esophageal reflux since prior to sleeve gastrectomy surgery. Symptoms have worsened over the last 6 months with recent EGD findings of grade B esophagitis, irregular z line, erythematous mucosa, and evidence of sleeve gastrectomy. 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 Formatting of this note might be different from the original. 07/2019: 8 mm colonic polyp removed during colonoscopy, biopsy showed serrated polyp. Repeat colonoscopy recommended  in one year. Formatting of this note might be different from the original. 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 Hypertension Formatting of this note might be different from the original. Formatting of this note might be different from the original. 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 Keratoconus Formatting of this note might be different from the original. Formatting of this note might be different from the original. Keratoconus Formatting of this note might be different from the original. Formatting of this note might be different from the original. Keratoconus Formatting of this note might be different from the or   Microhematuria 06/27/2015   Migraine 08/28/2014   Formatting of this note might be different from the original. Migraine Formatting of this note might be different from the original. Formatting of this note might be different from the original. Migraine Formatting of this note might be different from the original. Migraine Formatting of this note might be different from the original. Migraine Formatting of this note might be different from the or   Migraine without aura and without status migrainosus, not intractable 08/28/2014   Formatting of this note might be different from the original. Formatting of this note might be different from the original. Formatting of this note might be different from the original. Migraine Formatting of this note might be different from the original. Migraine Formatting of  this note might be different from the original. Formatting of this note might be different from the original. Migraine F   Mild intermittent asthma without complication 10/30/2008   Formatting of this note might be different from the original. Formatting of this note might be different from the original. Formatting of this note might be different from the original. Asthma Formatting of this note might be different from the original. Asthma Formatting of this note might be different from the original. Formatting of this note might be different from the original. 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 Formatting of this note might be different from the original. 01/25/2016 with Dr. Dionisio David at [facility]. [place] of this note might be different from the original. 01/25/2016 with Dr[facility] Dionisio David at [facility]. Formatting of this note might be different from the original. Formatting of this note might be different from    Seronegative inflammatory arthritis 08/28/2014   Formatting of this note might be different from the original. Formatting of this note might be different from the original. Arthritis; diagnosed as IBD related - 2012 Formatting of this note might be different from the original. Followed by rheumatologist. Had to  change providers due to an insurance change earlier this year resulting in patient not being able to have Remicade for ~6 months. 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 Formatting of this note might be different from the original. Treated with PRN miralax. Recent colonoscopy 07/2019. Formatting of this note might be different from the original. Treated with PRN miralax. Recent colonoscopy 07/2019. Formatting of this note might be different from the original. 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   Coronary artery disease s/p PCI/DES to LAD 07/01/2023 12/28/2022   Precordial chest pain 10/13/2022   Hyperlipidemia 10/07/2022   Hiatal hernia 10/07/2022   Esophageal candidiasis (HCC) 06/26/2022   Urinary tract infection, site not specified 06/26/2022   Ureteral stone 01/20/2022   Encounter for monitoring immunomodulating therapy 11/20/2021   Rheumatoid arthritis of multiple sites with 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 factor (HCC) 11/16/2021   Pyelonephritis 09/25/2021   De Quervain's tenosynovitis, left 07/31/2021   Contusion of left wrist 07/08/2021   Status post placement of ureteral stent 02/06/2021   Proteinuria 08/12/2020   Environmental allergies 08/12/2020   History of kidney stones 08/12/2020   Tooth infection  08/12/2020   Gastroesophageal reflux disease with esophagitis without hemorrhage 08/01/2019   Slow transit constipation 08/01/2019   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   History of colon polyps 06/19/2019   Irregular astigmatism of both eyes 10/11/2018   Nephrolithiasis 09/08/2018   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   Plantar wart 11/04/2016   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   Myopia with astigmatism and presbyopia 05/07/2016   Hematochezia 03/18/2016   Overactive bladder 11/18/2015   Incontinence 10/07/2015   Albuminuria 06/27/2015   Essential hypertension 06/27/2015   Microhematuria 06/27/2015   Hematuria 06/18/2015   Cataract 05/06/2015   Dry eyes 05/06/2015   Crohn's disease (HCC) 04/21/2015   Osteoarthritis of both knees 04/21/2015   Right knee pain 04/21/2015   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/30/2014   History of abnormal cervical Pap smear 11/29/2014   Fibrositis 08/28/2014   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   Migraine without aura and without status migrainosus, not intractable 08/28/2014   Migraine 08/28/2014   Seronegative inflammatory arthritis 08/28/2014   Increased urinary frequency 07/16/2013   Dysfunction of both eustachian tubes 08/31/2012   Asthma 10/30/2008   Mild intermittent asthma without complication 10/30/2008    Past Surgical History:  Procedure Laterality Date   BACK SURGERY  L5-S1   CERVICAL CONE BIOPSY  04/2000   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   CORONARY STENT INTERVENTION N/A 07/01/2023   Procedure: CORONARY STENT INTERVENTION;  Surgeon: Iran Ouch, MD;  Location: [facility] INVAS[facility];  Service: Cardiovascular;  Laterality: N/A;   EYE SURGERY     GASTRIC BYPASS  2017   Sleve   LAPAROSCOPIC GASTRIC SLEEVE RESECTION     LEFT HEART CATH AND CORONARY ANGIOGRAPHY N/A 07/01/2023   Procedure: LEFT HEART CATH AND CORONARY ANGIOGRAPHY;  Surgeon: Iran Ouch, MD;  Location: [facility]  INVASIVE CV [facility];  Service: Cardiovascular;  Laterality: N/A;   LEFT HEART CATH AND CORONARY ANGIOGRAPHY N/A 08/01/2023   Procedure: LEFT HEART CATH AND CORONARY ANGIOGRAPHY;  Surgeon: Yates Decamp, MD;  Location: [facility];  Service: Cardiovascular;  Laterality: N/A;   Low back disc surgery     06/2000   TOTAL KNEE ARTHROPLASTY Right 11/2016    OB History     Gravida  0   Para  0   Term  0   Preterm  0   AB  0   Living  0      SAB  0   IAB  0   Ectopic  0   Multiple  0   Live Births  0            Home Medications    Prior to Admission medications   Medication Sig Start Date End Date Taking? Authorizing Provider  acetaminophen (TYLENOL) 500 MG tablet Take 1,000 mg by mouth every 12 (twelve) hours as needed for mild pain or moderate pain.    [provider]  albuterol (PROVENTIL) (2.5 MG/3ML) 0.083% nebulizer solution Take 3 mLs (2.5 mg total) by nebulization every 6 (six) hours as needed for wheezing or shortness of breath. 11/30/22   Olive Bass, FNP  amLODipine (NORVASC) 10 MG tablet Take 1 tablet (10 mg total) by mouth daily. 08/08/23 11/06/23  Georgeanna Lea, MD  atorvastatin (LIPITOR) 80 MG tablet Take 1 tablet (80 mg total) by mouth daily. Patient not taking: Reported on 09/09/2023 07/03/23   Lonia Blood, MD  B Complex-C (B-COMPLEX WITH VITAMIN C) tablet Take 1 tablet by mouth daily. 09/06/19   [provider]  cephALEXin (KEFLEX) 500 MG capsule Take 2 capsules (1,000 mg total) by mouth 2 (two) times daily after a meal for 7 days. 09/02/23 09/09/23  Prescilla Sours, FNP  Cholecalciferol (VITAMIN D3) 50 MCG (2000 UT) TABS Take 1 tablet by mouth daily.    [provider]  EPINEPHrine (EPIPEN 2-PAK) 0.3 mg/0.3 mL IJ SOAJ injection Inject 0.3 mg into the muscle as needed for anaphylaxis. 01/18/23   Marcelyn Bruins, MD  esomeprazole (NEXIUM) 40 MG capsule Take 1 capsule (40 mg total) by mouth 2 (two) times  daily before a meal. Patient taking differently: Take 80 mg by mouth daily. 11/30/22   Olive Bass, FNP  famotidine (PEPCID) 40 MG tablet Take 1 tablet (40 mg total) by mouth daily. 11/30/22   Olive Bass, FNP  Golimumab [facility] ARIA IV) Inject 200 mg into the vein every 8 (eight) weeks.    [provider]  levocetirizine (XYZAL) 5 MG tablet Take 10 mg by mouth in the morning.    [provider]  linaclotide Karlene Einstein) 145 MCG CAPS capsule Take 145 mcg by mouth daily before breakfast. 03/23/23   [provider]  mirabegron ER (MYRBETRIQ) 50 MG TB24 tablet Take  1 tablet (50 mg total) by mouth daily. 11/30/22   Olive Bass, FNP  montelukast (SINGULAIR) 10 MG tablet Take 1 tablet (10 mg total) by mouth at bedtime. Patient taking differently: Take 10 mg by mouth daily. 11/30/22   Olive Bass, FNP  nitroGLYCERIN (NITROSTAT) 0.4 MG SL tablet Place 1 tablet (0.4 mg total) under the tongue every 5 (five) minutes as needed for chest pain. 10/13/22   Georgeanna Lea, MD  oxyCODONE (OXY IR/ROXICODONE) 5 MG immediate release tablet Take 5 mg by mouth as needed for moderate pain (pain score 4-6) or severe pain (pain score 7-10). Very rarely    [provider]  OXYGEN Inhale 2 L into the lungs at bedtime.    [provider]  ticagrelor (BRILINTA) 90 MG TABS tablet Take 1 tablet (90 mg total) by mouth 2 (two) times daily. 07/01/23   Lonia Blood, MD  ZOLMitriptan (ZOMIG) 2.5 MG tablet Take 1 tablet (2.5 mg total) by mouth once for 1 dose. May repeat in 2 hours if headache persists or recurs. Patient taking differently: Take 2.5 mg by mouth daily as needed for migraine. 11/30/22 09/16/23  Olive Bass, FNP    Family History Family History  Adopted: Yes  Problem Relation Age of Onset   Diabetes Mother    Hypertension Mother    Diabetes Father    Hypertension Father    Hypertension Sister    Diabetes Sister     Hypertension Brother    Diabetes Brother    Diabetes Paternal Grandmother    Cancer Other    Asthma Neg Hx    Allergic rhinitis Neg Hx    Atopy Neg Hx    Eczema Neg Hx     Social History Social History   Tobacco Use   Smoking status: Never    Passive exposure: Never   Smokeless tobacco: Never  Vaping Use   Vaping status: Never Used  Substance Use Topics   Alcohol use: Yes    Comment: rare   Drug use: Never     Allergies   Albuterol sulfate, Tape, Formoterol, Hydrocodone, Hydrocodone-acetaminophen, Nickel, Other, Oxycodone-acetaminophen, Salmeterol, Strawberry extract, Advair hfa [fluticasone-salmeterol], Proventil hfa [albuterol], Asa [aspirin], Cleocin [clindamycin], Clindamycin/lincomycin, Fludrocortisone acetate, Gabapentin, Hydrochlorothiazide, Imipramine, Ipratropium bromide, Medroxyprogesterone, Meloxicam, Metformin and related, Nystatin, Penicillins, Pred forte [prednisolone], Prednisone, Solu-medrol [methylprednisolone], Sulfa antibiotics, Tegaderm ag mesh [silver], Toradol [ketorolac tromethamine], and Tramadol   Review of Systems Review of Systems Per HPI  Physical Exam Triage Vital Signs ED Triage Vitals  Encounter Vitals Group     BP 09/09/23 1738 137/85     Systolic BP Percentile --      Diastolic BP Percentile --      Pulse Rate 09/09/23 1738 92     Resp 09/09/23 1738 17     Temp 09/09/23 1738 98.1 F (36.7 C)     Temp Source 09/09/23 1738 Oral     SpO2 09/09/23 1738 97 %     Weight --      Height --      Head Circumference --      Peak Flow --      Pain Score 09/09/23 1736 7     Pain Loc --      Pain Education --      Exclude from Growth Chart --    No data found.  Updated Vital Signs BP 137/85 (BP Location: Right Arm)   Pulse 92   Temp 98.1 F (36.7 C) (  Oral)   Resp 17   SpO2 97%   Visual Acuity Right Eye Distance:   Left Eye Distance:   Bilateral Distance:    Right Eye Near:   Left Eye Near:    Bilateral Near:     Physical  Exam Vitals and nursing note reviewed.  Constitutional:      Appearance: She is not ill-appearing or toxic-appearing.  HENT:     Head: Normocephalic and atraumatic.     Right Ear: Hearing and external ear normal.     Left Ear: Hearing and external ear normal.     Nose: Nose normal.     Mouth/Throat:     Lips: Pink.  Eyes:     General: Lids are normal. Vision grossly intact. Gaze aligned appropriately.     Extraocular Movements: Extraocular movements intact.     Conjunctiva/sclera: Conjunctivae normal.  Pulmonary:     Effort: Pulmonary effort is normal.  Musculoskeletal:     Cervical back: Neck supple.  Skin:    General: Skin is warm and dry.     Capillary Refill: Capillary refill takes less than 2 seconds.     Findings: Erythema present. No rash.          Comments: Subtle superficial erythema to bilateral lower extremities. Wound present to RLE, no drainage or overt redness surrounding wound. BLE warmth to palpation with +2 pitting edema bilaterally. Negative homans signs bilaterally. +2 anterior tibialis pulses.   Neurological:     General: No focal deficit present.     Mental Status: She is alert and oriented to person, place, and time. Mental status is at baseline.     Cranial Nerves: No dysarthria or facial asymmetry.  Psychiatric:        Mood and Affect: Mood normal.        Speech: Speech normal.        Behavior: Behavior normal.        Thought Content: Thought content normal.        Judgment: Judgment normal.      UC Treatments / Results  Labs (all labs ordered are listed, but only abnormal results are displayed) Labs Reviewed - No data to display  EKG   Radiology   Procedures Procedures (including critical care time)  Medications Ordered in UC Medications - No data to display  Initial Impression / Assessment and Plan / UC Course  I have reviewed the triage vital signs and the nursing notes.  Pertinent labs & imaging results that were available during  my care of the patient were reviewed by me and considered in my medical decision making (see chart for details).   1. Leg swelling Unclear etiology of patient's leg swelling. Question venous stasis dermatitis contributing to warmth and subtle redness to the bilateral lower extremities versus persistent cellulitis. She has allergies to penicillin, clindamycin, and sulfa antibiotics. Lungs clear.  Patient would benefit from further workup and evaluation in the emergency department with stat blood work and possible IV antibiotics to treat persistent cellulitis in the setting of immunosuppression.  Discussed clinical concerns/exam findings leading to recommendation for further workup in the ER setting and risks of deferring ER visit with patient/family. Patient/family express understanding and agreement with plan, discharged to ER via personal vehicle.   Final Clinical Impressions(s) / UC Diagnoses   Final diagnoses:  Leg swelling   Discharge Instructions   None    ED Prescriptions   None    PDMP not reviewed this encounter.  Carlisle Beers, [place] 09/09/23 Silva Bandy

## 2023-09-09 NOTE — ED Provider Notes (Signed)
[place] [facility] Provider Note   CSN: [id number] Arrival date & time: 09/09/23  1833     History  Chief Complaint  Patient presents with   Leg Swelling    Casey [name] is a 53 y.o. female.  Pt is a 53 yo female with pmhx significant for cellulitis, dvt, asthma, RA, DM, kidney stones, Crohn's disease, glaucoma, hld, anxiety, atopic dermatitis, gerd, migraines, CAD s/p stent to LAD (11/24), and esophageal candidiasis requiring IV antifungals.  Pt had cellulitis in RLE that was diagnosed on 1/24 and rx'd with keflex (just bid).  Pt said the redness has not improved and now she has some redness and swelling to both legs.  Pt went back to UC today and was sent here for further eval.        Home Medications Prior to Admission medications   Medication Sig Start Date End Date Taking? Authorizing Provider  doxycycline (VIBRAMYCIN) 100 MG capsule Take 1 capsule (100 mg total) by mouth 2 (two) times daily. 09/09/23  Yes Jacalyn Lefevre, MD  oxyCODONE (OXY IR/ROXICODONE) 5 MG immediate release tablet Take 1 tablet (5 mg total) by mouth every 4 (four) hours as needed for severe pain (pain score 7-10). 09/09/23  Yes Jacalyn Lefevre, MD  acetaminophen (TYLENOL) 500 MG tablet Take 1,000 mg by mouth every 12 (twelve) hours as needed for mild pain or moderate pain.    [provider]  albuterol (PROVENTIL) (2.5 MG/3ML) 0.083% nebulizer solution Take 3 mLs (2.5 mg total) by nebulization every 6 (six) hours as needed for wheezing or shortness of breath. 11/30/22   Olive Bass, FNP  amLODipine (NORVASC) 10 MG tablet Take 1 tablet (10 mg total) by mouth daily. 08/08/23 11/06/23  Georgeanna Lea, MD  atorvastatin (LIPITOR) 80 MG tablet Take 1 tablet (80 mg total) by mouth daily. Patient not taking: Reported on 09/09/2023 07/03/23   Lonia Blood, MD  B Complex-C (B-COMPLEX WITH VITAMIN C) tablet Take 1 tablet by mouth daily. 09/06/19    [provider]  cephALEXin (KEFLEX) 500 MG capsule Take 2 capsules (1,000 mg total) by mouth 2 (two) times daily after a meal for 7 days. 09/02/23 09/09/23  Prescilla Sours, FNP  Cholecalciferol (VITAMIN D3) 50 MCG (2000 UT) TABS Take 1 tablet by mouth daily.    [provider]  EPINEPHrine (EPIPEN 2-PAK) 0.3 mg/0.3 mL IJ SOAJ injection Inject 0.3 mg into the muscle as needed for anaphylaxis. 01/18/23   Marcelyn Bruins, MD  esomeprazole (NEXIUM) 40 MG capsule Take 1 capsule (40 mg total) by mouth 2 (two) times daily before a meal. Patient taking differently: Take 80 mg by mouth daily. 11/30/22   Olive Bass, FNP  famotidine (PEPCID) 40 MG tablet Take 1 tablet (40 mg total) by mouth daily. 11/30/22   Olive Bass, FNP  Golimumab [facility] ARIA IV) Inject 200 mg into the vein every 8 (eight) weeks.    [provider]  levocetirizine (XYZAL) 5 MG tablet Take 10 mg by mouth in the morning.    [provider]  linaclotide Karlene Einstein) 145 MCG CAPS capsule Take 145 mcg by mouth daily before breakfast. 03/23/23   [provider]  mirabegron ER (MYRBETRIQ) 50 MG TB24 tablet Take 1 tablet (50 mg total) by mouth daily. 11/30/22   Olive Bass, FNP  montelukast (SINGULAIR) 10 MG tablet Take 1 tablet (10 mg total) by mouth at bedtime. Patient taking differently: Take 10 mg  by mouth daily. 11/30/22   Olive Bass, FNP  nitroGLYCERIN (NITROSTAT) 0.4 MG SL tablet Place 1 tablet (0.4 mg total) under the tongue every 5 (five) minutes as needed for chest pain. 10/13/22   Georgeanna Lea, MD  oxyCODONE (OXY IR/ROXICODONE) 5 MG immediate release tablet Take 5 mg by mouth as needed for moderate pain (pain score 4-6) or severe pain (pain score 7-10). Very rarely    [provider]  OXYGEN Inhale 2 L into the lungs at bedtime.    [provider]  ticagrelor (BRILINTA) 90 MG TABS tablet Take 1 tablet (90 mg total) by mouth  2 (two) times daily. 07/01/23   Lonia Blood, MD  ZOLMitriptan (ZOMIG) 2.5 MG tablet Take 1 tablet (2.5 mg total) by mouth once for 1 dose. May repeat in 2 hours if headache persists or recurs. Patient taking differently: Take 2.5 mg by mouth daily as needed for migraine. 11/30/22 09/16/23  Olive Bass, FNP      Allergies    Albuterol sulfate, Tape, Formoterol, Hydrocodone, Hydrocodone-acetaminophen, Nickel, Other, Oxycodone-acetaminophen, Salmeterol, Strawberry extract, Advair hfa [fluticasone-salmeterol], Proventil hfa [albuterol], Asa [aspirin], Cleocin [clindamycin], Clindamycin/lincomycin, Fludrocortisone acetate, Gabapentin, Hydrochlorothiazide, Imipramine, Ipratropium bromide, Medroxyprogesterone, Meloxicam, Metformin and related, Nystatin, Penicillins, Pred forte [prednisolone], Prednisone, Solu-medrol [methylprednisolone], Sulfa antibiotics, Tegaderm ag mesh [silver], Toradol [ketorolac tromethamine], and Tramadol    Review of Systems   Review of Systems  Skin:  Positive for color change.  All other systems reviewed and are negative.   Physical Exam Updated Vital Signs BP (!) 158/79 (BP Location: Right Arm)   Pulse 100   Temp 99.6 F (37.6 C) (Oral)   Resp 18   Ht 5\' 3"  (1.6 m)   Wt 101.6 kg   SpO2 100%   BMI 39.68 kg/m  Physical Exam Vitals and nursing note reviewed.  Constitutional:      Appearance: Normal appearance. She is obese.  HENT:     Head: Normocephalic and atraumatic.     Right Ear: External ear normal.     Left Ear: External ear normal.     Nose: Nose normal.     Mouth/Throat:     Mouth: Mucous membranes are moist.     Pharynx: Oropharynx is clear.  Eyes:     Extraocular Movements: Extraocular movements intact.     Conjunctiva/sclera: Conjunctivae normal.     Pupils: Pupils are equal, round, and reactive to light.  Cardiovascular:     Rate and Rhythm: Normal rate and regular rhythm.     Pulses: Normal pulses.     Heart sounds: Normal  heart sounds.  Pulmonary:     Effort: Pulmonary effort is normal.     Breath sounds: Normal breath sounds.  Abdominal:     General: Abdomen is flat. Bowel sounds are normal.     Palpations: Abdomen is soft.  Musculoskeletal:     Cervical back: Normal range of motion and neck supple.     Right lower leg: Edema present.     Left lower leg: Edema present.     Comments: BLE with some slight redness; both lower legs are warm to touch  Skin:    Capillary Refill: Capillary refill takes less than 2 seconds.  Neurological:     General: No focal deficit present.     Mental Status: She is alert and oriented to person, place, and time.  Psychiatric:        Mood and Affect: Mood normal.  Behavior: Behavior normal.     ED Results / Procedures / Treatments   Labs (all labs ordered are listed, but only abnormal results are displayed) Labs Reviewed  COMPREHENSIVE METABOLIC PANEL - Abnormal; Notable for the following components:      Result Value   Potassium 2.8 (*)    Glucose, Bld 117 (*)    Total Protein 8.2 (*)    All other components within normal limits  CBC WITH DIFFERENTIAL/PLATELET - Abnormal; Notable for the following components:   WBC 12.2 (*)    RDW 16.2 (*)    All other components within normal limits  URINALYSIS, ROUTINE W REFLEX MICROSCOPIC - Abnormal; Notable for the following components:   Hgb urine dipstick MODERATE (*)    Protein, ur 30 (*)    All other components within normal limits  BRAIN NATRIURETIC PEPTIDE  URINALYSIS, MICROSCOPIC (REFLEX)  MAGNESIUM  TROPONIN I (HIGH SENSITIVITY)  TROPONIN I (HIGH SENSITIVITY)    EKG None  Radiology US Venous Img Lower Bilateral (DVT) Result Date: 09/09/2023 CLINICAL DATA:  Bilateral leg swelling and right leg pain. Recent cellulitis. History of pain, edema, color changes, varicose veins, spider veins. EXAM: Bilateral LOWER EXTREMITY VENOUS DOPPLER ULTRASOUND TECHNIQUE: Gray-scale sonography with compression, as well  as color and duplex ultrasound, were performed to evaluate the deep venous system(s) from the level of the common femoral vein through the popliteal and proximal calf veins. COMPARISON:  None Available. FINDINGS: VENOUS Normal compressibility of the bilateral common femoral, superficial femoral, and popliteal veins, as well as the visualized calf veins. Visualized portions of profunda femoral vein and great saphenous vein unremarkable. No filling defects to suggest DVT on grayscale or color Doppler imaging. Doppler waveforms show normal direction of venous flow, normal respiratory plasticity and response to augmentation. OTHER Images obtained in the right anterior shin corresponding to an area of focal pain demonstrate a small fluid collection measuring 1.2 x 0.9 x 1.1 cm in the subcutaneous soft tissues. This could represent edema, hematoma, or small abscess. Soft tissue edema is demonstrated in the calf regions bilaterally. Limitations: none IMPRESSION: 1. No evidence of deep venous thrombosis in the visualized lower extremity veins. 2. Bilateral lower extremity soft tissue edema. 3. Small fluid collection in the right anterior shin possibly representing edema, hematoma, or small abscess. Electronically Signed   By: Burman Nieves M.D.   On: 09/09/2023 22:13   UE VENOUS DUPLEX Result Date: 09/08/2023 UPPER VENOUS STUDY  Patient Name:  LANDI BISCARDI  Date of Exam:   09/08/2023 Medical Rec #: [id number]     Accession #:    [id number] Date of Birth: [birth year]     Patient Gender: F Patient Age:   52 years Exam Location:  [facility] Procedure:      VAS [place] UPPER EXTREMITY VENOUS DUPLEX Referring Phys: Judyann Munson --------------------------------------------------------------------------------  Indications: Pain Other Indications: Recent LUE IV placement. Limitations: Poor ultrasound/tissue interface and body habitus. 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 Bilateral testing is considered an integral part of a complete examination. Limited examinations for reoccurring indications may be performed as noted.  Right Findings: +----------+------------+---------+-----------+----------+-------+ RIGHT  CompressiblePhasicitySpontaneousPropertiesSummary +----------+------------+---------+-----------+----------+-------+ Subclavian    Full       Yes       Yes                      +----------+------------+---------+-----------+----------+-------+  Left Findings: +----------+------------+---------+-----------+----------+-------+ LEFT      CompressiblePhasicitySpontaneousPropertiesSummary +----------+------------+---------+-----------+----------+-------+ IJV           Full       Yes       Yes                      +----------+------------+---------+-----------+----------+-------+ Subclavian    Full       Yes       Yes                      +----------+------------+---------+-----------+----------+-------+ Axillary      Full       Yes       Yes                      +----------+------------+---------+-----------+----------+-------+ Brachial      Full       Yes       Yes                      +----------+------------+---------+-----------+----------+-------+ Radial        Full                                          +----------+------------+---------+-----------+----------+-------+ Ulnar         Full                                          +----------+------------+---------+-----------+----------+-------+ Cephalic      Full                                          +----------+------------+---------+-----------+----------+-------+ Basilic        Full       Yes       Yes                      +----------+------------+---------+-----------+----------+-------+  Summary:  Right: No evidence of thrombosis in the subclavian.  Left: No evidence of deep vein thrombosis in the upper extremity. No evidence of superficial vein thrombosis in the upper extremity.  *See table(s) above for measurements and observations.  Diagnosing physician: Sherald Hess MD Electronically signed by Sherald Hess MD on 09/08/2023 at 6:53:40 PM.    Final     Procedures Procedures    Medications Ordered in ED Medications  potassium chloride SA (KLOR-CON M) CR tablet 40 mEq (40 mEq Oral Patient Refused/Not Given 09/09/23 2233)  potassium chloride 10 mEq in 100 mL IVPB (10 mEq Intravenous New Bag/Given 09/09/23 2240)  cefTRIAXone (ROCEPHIN) 1 g in sodium chloride 0.9 % 100 mL IVPB (0 g Intravenous Stopped 09/09/23 2133)    ED Course/ Medical Decision Making/ A&P                                 Medical Decision Making  Amount and/or Complexity of Data Reviewed Labs: ordered.  Risk Prescription drug management.   This patient presents to the ED for concern of BLE redness and swelling, this involves an extensive number of treatment options, and is a complaint that carries with it a high risk of complications and morbidity.  The differential diagnosis includes cellulitis, DVT, peripheral edema   Co morbidities that complicate the patient evaluation  cellulitis, dvt, asthma, RA, DM, kidney stones, Crohn's disease, glaucoma, hld, anxiety, atopic dermatitis, gerd, migraines, CAD s/p stent to LAD (11/24), and esophageal candidiasis requiring IV antifungals   Additional history obtained:  Additional history obtained from epic chart review  Lab Tests:  I Ordered, and personally interpreted labs.  The pertinent results include:  ua + hgb, protein, no uti; trop nl, bnp nl at 57; cbc with sl elevation in wbc at 12.2, cmp with k low at 2.8   Imaging  Studies ordered:  I ordered imaging studies including [place]  I independently visualized and interpreted imaging which showed  No evidence of deep venous thrombosis in the visualized lower  extremity veins.  2. Bilateral lower extremity soft tissue edema.  3. Small fluid collection in the right anterior shin possibly  representing edema, hematoma, or small abscess.   I agree with the radiologist interpretation   Medicines ordered and prescription drug management:  I ordered medication including rocephin  for sx  Reevaluation of the patient after these medicines showed that the patient improved I have reviewed the patients home medicines and have made adjustments as needed   Test Considered:  [place]   Critical Interventions:  Abx; k  Problem List / ED Course:  Cellulitis:  iv rocephin given.  Pt is d/c with doxy.  She knows to return if worse.  F/u with pcp. Hypokalemia:  iv k given.  Pt does not want oral k.  She said it makes her vomit.  She knows to have her pcp recheck her k in about a week.   Reevaluation:  After the interventions noted above, I reevaluated the patient and found that they have :improved   Social Determinants of Health:  Lives at home   Dispostion:  After consideration of the diagnostic results and the patients response to treatment, I feel that the patent would benefit from discharge with outpatient f/u.          Final Clinical Impression(s) / ED Diagnoses Final diagnoses:  Cellulitis of right lower extremity  Cellulitis of left lower extremity  Hypokalemia    Rx / DC Orders ED Discharge Orders          Ordered    doxycycline (VIBRAMYCIN) 100 MG capsule  2 times daily        09/09/23 2243    oxyCODONE (OXY IR/ROXICODONE) 5 MG immediate release tablet  Every 4 hours PRN        09/09/23 2243              Jacalyn Lefevre, MD 09/09/23 2245

## 2023-09-09 NOTE — ED Notes (Signed)
Pt ambulated to restroom to obtain urine specimen independently with normal gait.

## 2023-09-09 NOTE — Progress Notes (Signed)
Diagnosis: Esophageal candidiasis  Provider:  Chilton Greathouse MD  Procedure: IV Infusion  IV Type: Peripheral, IV Location: L Forearm  Cancidas (caspofungin), Dose: 50 mg  Infusion Start Time: 1529  Infusion Stop Time: 1635  Post Infusion IV Care: Patient declined observation and Peripheral IV Discontinued  Discharge: Condition: Stable, Destination: Home . AVS Declined  Performed by:  Wyvonne Lenz, RN

## 2023-09-10 ENCOUNTER — Other Ambulatory Visit: Payer: Self-pay

## 2023-09-10 ENCOUNTER — Encounter (HOSPITAL_COMMUNITY): Payer: Self-pay | Admitting: *Deleted

## 2023-09-10 ENCOUNTER — Emergency Department (HOSPITAL_COMMUNITY): Payer: 59

## 2023-09-10 ENCOUNTER — Emergency Department (HOSPITAL_COMMUNITY)
Admission: EM | Admit: 2023-09-10 | Discharge: 2023-09-11 | Disposition: A | Discharge status: Home / Self Care | Payer: 59 | Attending: Emergency Medicine | Admitting: Emergency Medicine

## 2023-09-10 DIAGNOSIS — J45909 Unspecified asthma, uncomplicated: Secondary | ICD-10-CM | POA: Insufficient documentation

## 2023-09-10 DIAGNOSIS — Z955 Presence of coronary angioplasty implant and graft: Secondary | ICD-10-CM | POA: Insufficient documentation

## 2023-09-10 DIAGNOSIS — I2510 Atherosclerotic heart disease of native coronary artery without angina pectoris: Secondary | ICD-10-CM | POA: Diagnosis not present

## 2023-09-10 DIAGNOSIS — R002 Palpitations: Secondary | ICD-10-CM | POA: Diagnosis not present

## 2023-09-10 DIAGNOSIS — E876 Hypokalemia: Secondary | ICD-10-CM | POA: Diagnosis not present

## 2023-09-10 DIAGNOSIS — I10 Essential (primary) hypertension: Secondary | ICD-10-CM | POA: Diagnosis not present

## 2023-09-10 DIAGNOSIS — Z79899 Other long term (current) drug therapy: Secondary | ICD-10-CM | POA: Insufficient documentation

## 2023-09-10 DIAGNOSIS — R079 Chest pain, unspecified: Secondary | ICD-10-CM | POA: Diagnosis present

## 2023-09-10 LAB — CBC
HCT: 39.6 % (ref 36.0–46.0)
Hemoglobin: 12.6 g/dL (ref 12.0–15.0)
MCH: 26.6 pg (ref 26.0–34.0)
MCHC: 31.8 g/dL (ref 30.0–36.0)
MCV: 83.7 fL (ref 80.0–100.0)
Platelets: 357 10*3/uL (ref 150–400)
RBC: 4.73 MIL/uL (ref 3.87–5.11)
RDW: 16 % — ABNORMAL HIGH (ref 11.5–15.5)
WBC: 12.5 10*3/uL — ABNORMAL HIGH (ref 4.0–10.5)
nRBC: 0 % (ref 0.0–0.2)

## 2023-09-10 LAB — BASIC METABOLIC PANEL
Anion gap: 13 (ref 5–15)
BUN: 8 mg/dL (ref 6–20)
CO2: 22 mmol/L (ref 22–32)
Calcium: 9.5 mg/dL (ref 8.9–10.3)
Chloride: 107 mmol/L (ref 98–111)
Creatinine, Ser: 0.81 mg/dL (ref 0.44–1.00)
GFR, Estimated: >60 mL/min (ref >60)
Glucose, Bld: 121 mg/dL — ABNORMAL HIGH (ref 70–99)
Potassium: 2.9 mmol/L — ABNORMAL LOW (ref 3.5–5.1)
Sodium: 142 mmol/L (ref 135–145)

## 2023-09-10 LAB — TROPONIN I (HIGH SENSITIVITY)
Troponin I (High Sensitivity): 4 ng/L (ref <18)
Troponin I (High Sensitivity): 3 ng/L (ref <18)

## 2023-09-10 LAB — CBG MONITORING, ED: Glucose-Capillary: 103 mg/dL — ABNORMAL HIGH (ref 70–99)

## 2023-09-10 MED ORDER — FENTANYL CITRATE PF 50 MCG/ML IJ SOSY
50.0000 ug | PREFILLED_SYRINGE | Freq: Once | INTRAMUSCULAR | Status: AC
  Administered 2023-09-10: 50 ug via INTRAVENOUS
  Filled 2023-09-10: qty 1

## 2023-09-10 MED ORDER — FENTANYL CITRATE PF 50 MCG/ML IJ SOSY
100.0000 ug | PREFILLED_SYRINGE | Freq: Once | INTRAMUSCULAR | Status: AC
Start: 2023-09-10 — End: 2023-09-10
  Administered 2023-09-10: 100 ug via INTRAVENOUS
  Filled 2023-09-10: qty 2

## 2023-09-10 MED ORDER — POTASSIUM CHLORIDE 10 MEQ/100ML IV SOLN
10.0000 meq | INTRAVENOUS | Status: AC
  Administered 2023-09-10 (×2): 10 meq via INTRAVENOUS
  Filled 2023-09-10 (×2): qty 100

## 2023-09-10 NOTE — ED Triage Notes (Addendum)
Acute onset chest pain starting at 1515.  Took 1 sl nitroglycerin with no relief, but did vomit after taking nitro.  However, the 2nd nitroglycerin given by ems did drop pain from 7 to 6. Pt was not give asa d/t allergy.  EKG unremarkable.  Cardiac stent placed in Nov.  Chest pain 6/10.    128/86 Hr 80 98% ra Cbg 20  Recently started doxycycline for cellulitis of leg.

## 2023-09-10 NOTE — ED Provider Triage Note (Signed)
Emergency Medicine Provider Triage Evaluation Note  Casey [name] , a 53 y.o. female  was evaluated in triage.  Pt complains of pain, sudden onset just prior to notifying EMS.  Review of Systems  Positive: Chest pain.  Ongoing cellulitis treatment currently on doxycycline Negative: Syncope  Physical Exam  There were no vitals taken for this visit. Gen:   Awake, no distress speaking clearly Resp:  Normal effort no increased work of breathing MSK:   Moves extremities without difficulty right lower extremity with trace erythema Other:  Neuro intact  Medical Decision Making  Medically screening exam initiated at 4:28 PM.  Appropriate orders placed.  Casey [name] was informed that the remainder of the evaluation will be completed by another provider, this initial triage assessment does not replace that evaluation, and the importance of remaining in the ED until their evaluation is complete.   Gerhard Munch, MD 09/10/23 [phone]

## 2023-09-10 NOTE — ED Provider Notes (Signed)
[place] EMERGENCY DEPARTMENT AT [facility] Provider Note   CSN: [id number] Arrival date & time: 09/10/23  1623     History  Chief Complaint  Patient presents with   Chest Pain    Casey [name] is a 53 y.o. female.  Pt is a 53 year old with history of rheumatoid arthritis, coronary artery disease with recent LAD stenting on 07/01/2023 and repeat cath in dec without acute new findings, moderate persistent asthma, hypertension, hyperlipidemia, history of DVT but not currently on anticoagulation who was in the emergency room last night due to cellulitis of her right lower extremity who received IV Rocephin, potassium and was sent home with doxycycline presenting today with complaint of palpitations and chest pain.  Patient reports that this morning she noticed the swelling in her legs had improved.  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  She took a nitro which did not significantly help her symptoms and she got up and vomited in the bathroom.  EMS gave her a second nitro on the way here and she reports currently her pain is just a dull achy sensation in the center of her chest that is a 6 out of 10.  She has had no further vomiting and denies any abdominal pain.  She does report it makes her feel short of breath.  Is not recently had cough or congestion.  She also reports still getting antifungals through her doctor's office.  The history is provided by the patient and medical records.  Chest Pain      Home Medications Prior to Admission medications   Medication Sig Start Date End Date Taking? Authorizing Provider  acetaminophen (TYLENOL) 500 MG tablet Take 1,000 mg by mouth every 12 (twelve) hours as needed for mild pain or moderate pain.    [provider]  albuterol (PROVENTIL) (2.5  MG/3ML) 0.083% nebulizer solution Take 3 mLs (2.5 mg total) by nebulization every 6 (six) hours as needed for wheezing or shortness of breath. 11/30/22   Olive Bass, FNP  amLODipine (NORVASC) 10 MG tablet Take 1 tablet (10 mg total) by mouth daily. 08/08/23 11/06/23  Georgeanna Lea, MD  atorvastatin (LIPITOR) 80 MG tablet Take 1 tablet (80 mg total) by mouth daily. Patient not taking: Reported on 09/09/2023 07/03/23   Lonia Blood, MD  B Complex-C (B-COMPLEX WITH VITAMIN C) tablet Take 1 tablet by mouth daily. 09/06/19   [provider]  Cholecalciferol (VITAMIN D3) 50 MCG (2000 UT) TABS Take 1 tablet by mouth daily.    [provider]  doxycycline (VIBRAMYCIN) 100 MG capsule Take 1 capsule (100 mg total) by mouth 2 (two) times daily. 09/09/23   Jacalyn Lefevre, MD  EPINEPHrine (EPIPEN 2-PAK) 0.3 mg/0.3 mL IJ SOAJ injection Inject 0.3 mg into the muscle as needed for anaphylaxis. 01/18/23   Marcelyn Bruins, MD  esomeprazole (NEXIUM) 40 MG capsule Take 1 capsule (40 mg total) by mouth 2 (two) times daily before a meal. Patient taking differently: Take 80 mg by mouth daily. 11/30/22   Olive Bass, FNP  famotidine (PEPCID) 40 MG tablet Take 1 tablet (40 mg total) by mouth daily. 11/30/22   Olive Bass, FNP  Golimumab [facility] ARIA IV) Inject 200 mg into the vein every 8 (eight) weeks.  [provider]  levocetirizine (XYZAL) 5 MG tablet Take 10 mg by mouth in the morning.    [provider]  linaclotide Karlene Einstein) 145 MCG CAPS capsule Take 145 mcg by mouth daily before breakfast. 03/23/23   [provider]  mirabegron ER (MYRBETRIQ) 50 MG TB24 tablet Take 1 tablet (50 mg total) by mouth daily. 11/30/22   Olive Bass, FNP  montelukast (SINGULAIR) 10 MG tablet Take 1 tablet (10 mg total) by mouth at bedtime. Patient taking differently: Take 10 mg by mouth daily. 11/30/22   Olive Bass, FNP   nitroGLYCERIN (NITROSTAT) 0.4 MG SL tablet Place 1 tablet (0.4 mg total) under the tongue every 5 (five) minutes as needed for chest pain. 10/13/22   Georgeanna Lea, MD  oxyCODONE (OXY IR/ROXICODONE) 5 MG immediate release tablet Take 5 mg by mouth as needed for moderate pain (pain score 4-6) or severe pain (pain score 7-10). Very rarely    [provider]  oxyCODONE (OXY IR/ROXICODONE) 5 MG immediate release tablet Take 1 tablet (5 mg total) by mouth every 4 (four) hours as needed for severe pain (pain score 7-10). 09/09/23   Jacalyn Lefevre, MD  OXYGEN Inhale 2 L into the lungs at bedtime.    [provider]  ticagrelor (BRILINTA) 90 MG TABS tablet Take 1 tablet (90 mg total) by mouth 2 (two) times daily. 07/01/23   Lonia Blood, MD  ZOLMitriptan (ZOMIG) 2.5 MG tablet Take 1 tablet (2.5 mg total) by mouth once for 1 dose. May repeat in 2 hours if headache persists or recurs. Patient taking differently: Take 2.5 mg by mouth daily as needed for migraine. 11/30/22 09/16/23  Olive Bass, FNP      Allergies    Albuterol sulfate, Tape, Formoterol, Hydrocodone, Hydrocodone-acetaminophen, Nickel, Other, Oxycodone-acetaminophen, Salmeterol, Strawberry extract, Advair hfa [fluticasone-salmeterol], Proventil hfa [albuterol], Asa [aspirin], Cleocin [clindamycin], Clindamycin/lincomycin, Fludrocortisone acetate, Gabapentin, Hydrochlorothiazide, Imipramine, Ipratropium bromide, Medroxyprogesterone, Meloxicam, Metformin and related, Nystatin, Penicillins, Pred forte [prednisolone], Prednisone, Solu-medrol [methylprednisolone], Sulfa antibiotics, Tegaderm ag mesh [silver], Toradol [ketorolac tromethamine], and Tramadol    Review of Systems   Review of Systems  Cardiovascular:  Positive for chest pain.    Physical Exam Updated Vital Signs BP 139/84   Pulse 85   Temp 98.2 F (36.8 C) (Oral)   Resp 17   Ht 5\' 3"  (1.6 m)   Wt 101.6 kg   SpO2 98%   BMI 39.68 kg/m   Physical Exam Vitals and nursing note reviewed.  Constitutional:      General: She is not in acute distress.    Appearance: She is well-developed.  HENT:     Head: Normocephalic and atraumatic.  Eyes:     Pupils: Pupils are equal, round, and reactive to light.  Cardiovascular:     Rate and Rhythm: Normal rate and regular rhythm.     Heart sounds: Normal heart sounds. No murmur heard.    No friction rub.  Pulmonary:     Effort: Pulmonary effort is normal.     Breath sounds: Normal breath sounds. No wheezing or rales.  Abdominal:     General: Bowel sounds are normal. There is no distension.     Palpations: Abdomen is soft.     Tenderness: There is no abdominal tenderness. There is no guarding or rebound.     Comments: No notable abdominal pain  Musculoskeletal:        General: No tenderness. Normal range of motion.  Right lower leg: Edema present.     Comments: Mild erythema and warmth noted of the right lower extremity with some mild nonpitting edema in the right lower extremity below the knee  Skin:    General: Skin is warm and dry.     Findings: No rash.  Neurological:     Mental Status: She is alert and oriented to person, place, and time. Mental status is at baseline.     Cranial Nerves: No cranial nerve deficit.  Psychiatric:        Behavior: Behavior normal.     ED Results / Procedures / Treatments   Labs (all labs ordered are listed, but only abnormal results are displayed) Labs Reviewed  BASIC METABOLIC PANEL - Abnormal; Notable for the following components:      Result Value   Potassium 2.9 (*)    Glucose, Bld 121 (*)    All other components within normal limits  CBC - Abnormal; Notable for the following components:   WBC 12.5 (*)    RDW 16.0 (*)    All other components within normal limits  CBG MONITORING, ED - Abnormal; Notable for the following components:   Glucose-Capillary 103 (*)    All other components within normal limits  TROPONIN I (HIGH  SENSITIVITY)  TROPONIN I (HIGH SENSITIVITY)    EKG EKG Interpretation Date/Time:  Saturday September 10 2023 16:28:27 EST Ventricular Rate:  91 PR Interval:  146 QRS Duration:  84 QT Interval:  364 QTC Calculation: 447 R Axis:   -46  Text Interpretation: Normal sinus rhythm Left anterior fascicular block Possible Anterolateral infarct , age undetermined No significant change since last tracing When compared with ECG of 30-Jul-2023 14:01, PREVIOUS ECG IS PRESENT Confirmed by Gwyneth Sprout ([id number]) on 09/10/2023 5:58:51 PM  Radiology DG Chest Portable 1 View Result Date: 09/10/2023 CLINICAL DATA:  Chest pain. EXAM: PORTABLE CHEST 1 VIEW COMPARISON:  July 29, 2023. FINDINGS: The heart size and mediastinal contours are within normal limits. Both lungs are clear. The visualized skeletal structures are unremarkable. IMPRESSION: No active disease. Electronically Signed   By: Lupita Raider M.D.   On: 09/10/2023 17:01   US Venous Img Lower Bilateral (DVT) Result Date: 09/09/2023 CLINICAL DATA:  Bilateral leg swelling and right leg pain. Recent cellulitis. History of pain, edema, color changes, varicose veins, spider veins. EXAM: Bilateral LOWER EXTREMITY VENOUS DOPPLER ULTRASOUND TECHNIQUE: Gray-scale sonography with compression, as well as color and duplex ultrasound, were performed to evaluate the deep venous system(s) from the level of the common femoral vein through the popliteal and proximal calf veins. COMPARISON:  None Available. FINDINGS: VENOUS Normal compressibility of the bilateral common femoral, superficial femoral, and popliteal veins, as well as the visualized calf veins. Visualized portions of profunda femoral vein and great saphenous vein unremarkable. No filling defects to suggest DVT on grayscale or color Doppler imaging. Doppler waveforms show normal direction of venous flow, normal respiratory plasticity and response to augmentation. OTHER Images obtained in the right anterior  shin corresponding to an area of focal pain demonstrate a small fluid collection measuring 1.2 x 0.9 x 1.1 cm in the subcutaneous soft tissues. This could represent edema, hematoma, or small abscess. Soft tissue edema is demonstrated in the calf regions bilaterally. Limitations: none IMPRESSION: 1. No evidence of deep venous thrombosis in the visualized lower extremity veins. 2. Bilateral lower extremity soft tissue edema. 3. Small fluid collection in the right anterior shin possibly representing edema, hematoma, or small abscess. Electronically  Signed   By: Burman Nieves M.D.   On: 09/09/2023 22:13    Procedures Procedures    Medications Ordered in ED Medications  fentaNYL (SUBLIMAZE) injection 50 mcg (50 mcg Intravenous Given 09/10/23 1951)  potassium chloride 10 mEq in 100 mL IVPB (0 mEq Intravenous Stopped 09/10/23 2204)  fentaNYL (SUBLIMAZE) injection 100 mcg (100 mcg Intravenous Given 09/10/23 2103)    ED Course/ Medical Decision Making/ A&P                                 Medical Decision Making Amount and/or Complexity of Data Reviewed Independent Historian: EMS External Data Reviewed: notes. Labs: ordered. Decision-making details documented in ED Course. Radiology: ordered and independent interpretation performed. Decision-making details documented in ED Course. ECG/medicine tests: ordered and independent interpretation performed. Decision-making details documented in ED Course.  Risk Prescription drug management.   Pt with multiple medical problems and comorbidities and presenting today with a complaint that caries a high risk for morbidity and mortality.  Here today with complaint of chest pain.  Patient also complains of palpitations however no evidence of dysrhythmia today based on my independent interpretation of her EKG she is in a sinus rhythm with no acute changes.  Concern for ACS, GI pathology given recent antibiotics, lower suspicion for PE as patient did have DVT scans  that were negative prior to her having any chest pain and she has had a DVT in her upper extremity after having an IV but has never had lower extremity DVT.  Low suspicion for CHF today as she had BNP done yesterday which was normal.  Also concern for electrolyte abnormalities. I independently interpreted patient's labs And BMP today with persistent hypokalemia at 2.9 with normal renal function and calcium levels, initial troponin is 4  and repeat is 3 and CBC with white count of 12 which has been persistent most likely related to the cellulitis that she is currently getting treatment for.  I have independently visualized and interpreted pt's images today.  Chest x-ray without acute findings and low suspicion for pneumothorax, rib fractures or pneumonia based on patient's history.  Will treat symptomatically for the pain again feel most likely related to GERD as it started 30 minutes after eating when she was lying down.  Will also replace potassium which patient reports she has to have IV because she cannot tolerate p.o.  Pt is tolerating po's and vS have remained stable.  Has been on continuous cardiac monitoring with no evidence of dysrhythmias.  At this time feel that patient is stable for discharge home.  She has all the medication she needs at home and has follow-up with infectious disease and cardiology this coming week.         Final Clinical Impression(s) / ED Diagnoses Final diagnoses:  Nonspecific chest pain  Hypokalemia    Rx / DC Orders ED Discharge Orders     None         Gwyneth Sprout, MD 09/10/23 2259

## 2023-09-10 NOTE — Discharge Instructions (Addendum)
All the testing on your heart today looks normal and no signs of abnormal rhythms.  Try to eat foods with increased potassium and continue to take your antacid pills.  However the antibiotics may be irritating your stomach as well however they do appear to be working.

## 2023-09-11 LAB — FUNGUS CULTURE W SMEAR
MICRO NUMBER:: 15982793
SMEAR:: NONE SEEN
SPECIMEN QUALITY:: ADEQUATE

## 2023-09-11 LAB — SUSCEPTIBILITY,YEAST LIMITED PANEL
Micro Number: 16019776
Specimen Quality: ADEQUATE

## 2023-09-11 LAB — TEST AUTHORIZATION: TEST CODE:: 36271

## 2023-09-11 NOTE — ED Notes (Signed)
Patient upset that she could not get a taxi voucher back to [facility].  This RN explained to patient that I did not have authority to give a taxi voucher back to [place], that she could wait in a hall bed for her ride or wait on social work to be able to get her voucher.  Patient chose to wait in the hall for her ride.

## 2023-09-12 ENCOUNTER — Encounter: Payer: Self-pay | Admitting: Podiatry

## 2023-09-12 ENCOUNTER — Encounter (HOSPITAL_COMMUNITY): Payer: 59

## 2023-09-12 ENCOUNTER — Ambulatory Visit: Payer: 59

## 2023-09-12 VITALS — BP 125/79 | HR 74 | Temp 98.1°F | Resp 16 | Ht 63.0 in | Wt 223.0 lb

## 2023-09-12 DIAGNOSIS — B3781 Candidal esophagitis: Secondary | ICD-10-CM

## 2023-09-12 MED ORDER — SODIUM CHLORIDE 0.9 % IV SOLN
50.0000 mg | Freq: Once | INTRAVENOUS | Status: AC
  Administered 2023-09-12: 50 mg via INTRAVENOUS
  Filled 2023-09-12: qty 10

## 2023-09-12 NOTE — Progress Notes (Signed)
Diagnosis: Esophageal candidiasis  Provider:  Chilton Greathouse MD  Procedure: IV Infusion  IV Type: Peripheral, IV Location: L Hand  Cancidas (caspofungin), Dose: 50 mg  Infusion Start Time: 1118  Infusion Stop Time: 1229  Post Infusion IV Care: Patient declined observation and Peripheral IV Discontinued  Discharge: Condition: Stable, Destination: Home . AVS Declined  Performed by:  Wyvonne Lenz, RN

## 2023-09-13 ENCOUNTER — Ambulatory Visit: Payer: 59 | Attending: Cardiology | Admitting: Cardiology

## 2023-09-13 ENCOUNTER — Encounter: Payer: Self-pay | Admitting: Cardiology

## 2023-09-13 ENCOUNTER — Ambulatory Visit: Payer: 59

## 2023-09-13 VITALS — BP 136/92 | HR 88 | Ht 63.0 in | Wt 223.8 lb

## 2023-09-13 VITALS — BP 129/69 | HR 84 | Temp 97.3°F | Resp 18 | Ht 63.0 in | Wt 224.2 lb

## 2023-09-13 DIAGNOSIS — D84821 Immunodeficiency due to drugs: Secondary | ICD-10-CM

## 2023-09-13 DIAGNOSIS — B3781 Candidal esophagitis: Secondary | ICD-10-CM

## 2023-09-13 DIAGNOSIS — I25118 Atherosclerotic heart disease of native coronary artery with other forms of angina pectoris: Secondary | ICD-10-CM

## 2023-09-13 DIAGNOSIS — I10 Essential (primary) hypertension: Secondary | ICD-10-CM

## 2023-09-13 DIAGNOSIS — R002 Palpitations: Secondary | ICD-10-CM

## 2023-09-13 DIAGNOSIS — E7800 Pure hypercholesterolemia, unspecified: Secondary | ICD-10-CM

## 2023-09-13 DIAGNOSIS — Z79899 Other long term (current) drug therapy: Secondary | ICD-10-CM

## 2023-09-13 DIAGNOSIS — Z9884 Bariatric surgery status: Secondary | ICD-10-CM

## 2023-09-13 MED ORDER — SODIUM CHLORIDE 0.9 % IV SOLN
50.0000 mg | Freq: Once | INTRAVENOUS | Status: AC
  Administered 2023-09-13: 50 mg via INTRAVENOUS
  Filled 2023-09-13: qty 10

## 2023-09-13 NOTE — Progress Notes (Signed)
 Diagnosis: Esophageal candidiasis  Provider:  Theophilus Roosevelt MD  Procedure: IV Infusion  IV Type: Peripheral, IV Location: L Antecubital  Cancidas  (caspofungin ), Dose: 50 mg  Infusion Start Time: 1525  Infusion Stop Time: 1638  Post Infusion IV Care: Patient declined observation and Peripheral IV Discontinued  Discharge: Condition: Stable, Destination: Home . AVS Declined  Performed by:  Rocky FORBES Sar, RN

## 2023-09-13 NOTE — Patient Instructions (Signed)
Medication Instructions:  Your physician recommends that you continue on your current medications as directed. Please refer to the Current Medication list given to you today.  *If you need a refill on your cardiac medications before your next appointment, please call your pharmacy*   Lab Work: None Ordered If you have labs (blood work) drawn today and your tests are completely normal, you will receive your results only by: MyChart Message (if you have MyChart) OR A paper copy in the mail If you have any lab test that is abnormal or we need to change your treatment, we will call you to review the results.   Testing/Procedures:  WHY IS MY DOCTOR PRESCRIBING ZIO? The Zio system is proven and trusted by physicians to detect and diagnose irregular heart rhythms -- and has been prescribed to hundreds of thousands of patients.  The FDA has cleared the Zio system to monitor for many different kinds of irregular heart rhythms. In a study, physicians were able to reach a diagnosis 90% of the time with the Zio system1.  You can wear the Zio monitor -- a small, discreet, comfortable patch -- during your normal day-to-day activity, including while you sleep, shower, and exercise, while it records every single heartbeat for analysis.  1Barrett, P., et al. Comparison of 24 Hour Holter Monitoring Versus 14 Day Novel Adhesive Patch Electrocardiographic Monitoring. American Journal of Medicine, 2014.  ZIO VS. HOLTER MONITORING The Zio monitor can be comfortably worn for up to 14 days. Holter monitors can be worn for 24 to 48 hours, limiting the time to record any irregular heart rhythms you may have. Zio is able to capture data for the 51% of patients who have their first symptom-triggered arrhythmia after 48 hours.1  LIVE WITHOUT RESTRICTIONS The Zio ambulatory cardiac monitor is a small, unobtrusive, and water-resistant patch--you might even forget you're wearing it. The Zio monitor records and stores  every beat of your heart, whether you're sleeping, working out, or showering.     Follow-Up: At [facility], you and your health needs are our priority.  As part of our continuing mission to provide you with exceptional heart care, we have created designated Provider Care Teams.  These Care Teams include your primary Cardiologist (physician) and Advanced Practice Providers (APPs -  Physician Assistants and Nurse Practitioners) who all work together to provide you with the care you need, when you need it.  We recommend signing up for the patient portal called "MyChart".  Sign up information is provided on this After Visit Summary.  MyChart is used to connect with patients for Virtual Visits (Telemedicine).  Patients are able to view lab/test results, encounter notes, upcoming appointments, etc.  Non-urgent messages can be sent to your provider as well.   To learn more about what you can do with MyChart, go to [email or web address].    Your next appointment:   5 month(s)  The format for your next appointment:   In Person  Provider:   Gypsy Balsam, MD    Other Instructions Referral to Pharm D - they will call for appt

## 2023-09-13 NOTE — Progress Notes (Signed)
 Cardiology Office Note:    Date:  09/13/2023   ID:  Casey [name], DOB July 19, 1971, MRN [id number]  PCP:  Iven Lang DASEN, PA-C  Cardiologist:  Lamar Fitch, MD    Referring MD: Fitch Lamar PARAS, MD   Chief Complaint  Patient presents with   Palpitations    History of Present Illness:    Casey [name] is a 53 y.o. female complex past medical history.  She does have morbid obesity, status post gastric sleeve surgery, rheumatoid arthritis on biological agent, type 2 diabetes, coronary artery disease.  PTCA and stenting of proximal LAD done in November 2024, after that Having chest pain.  Another cardiac catheterization done in the middle of December showing patent stent site in spite of that still having some chest pain.  Eventually look like the problem is candidiasis.  She developed cellulitis of lower extremities she was put on antibiotic after that developed again candidiasis treated aggressively with antibiotics right now.  The problem is her statin has been withdrawn because of that.  She said she is doing better in terms of chest pain but complained of having palpitations.  What you mean by that heart speeding up quite rapidly no dizziness no passing out  Past Medical History:  Diagnosis Date   Albuminuria 06/27/2015   Anxiety 11/04/2014   Arm DVT (deep venous thromboembolism), acute, left (HCC) 08/17/2022   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/30/2014   Asthma    Atopic dermatitis 10/30/2008   Formatting of this note might be different from the original. Atopic dermatitis Formatting of this note might be different from the original. Formatting of this note might be different from the original. Atopic dermatitis Formatting of this note might be different from the original. Formatting of this note might be different from the original. Formatting of this note might be different from the or   Cataract 05/06/2015   Contusion of left wrist 07/08/2021   Coronary  artery disease s/p PCI/DES to LAD 07/01/2023 12/28/2022   Crohn's disease (HCC) 04/21/2015   De Quervain's tenosynovitis, left 07/31/2021   Diabetes mellitus without complication (HCC)    Diabetes type 2, controlled (HCC) 04/21/2015   Dry eyes 05/06/2015   DVT (deep venous thrombosis) (HCC)    Dysfunction of both eustachian tubes 08/31/2012   Encounter for monitoring immunomodulating therapy 11/20/2021   Environmental allergies 08/12/2020   Esophageal candidiasis (HCC) 06/26/2022   Essential hypertension 06/27/2015   Fibrositis 08/28/2014   Formatting of this note might be different from the original. Fibromyalgia Formatting of this note might be different from the original. Formatting of this note might be different from the original. Fibromyalgia   Flank pain 11/24/2020   Gastroesophageal reflux disease with esophagitis without hemorrhage 08/01/2019   Formatting of this note might be different from the original. Formatting of this note might be different from the original. 07/2019: chronic symptoms of esophageal reflux since prior to sleeve gastrectomy surgery. Symptoms have worsened over the last 6 months with recent EGD findings of grade B esophagitis, irregular z line, erythematous mucosa, and evidence of sleeve gastrectomy. 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 Formatting of this note might be different from the original. 07/2019: 8 mm colonic polyp removed  during colonoscopy, biopsy showed serrated polyp. Repeat colonoscopy recommended in one year. Formatting of this note might be different from the original. 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 Hypertension Formatting of this note might be different from the original. Formatting of this note might be different from the original. 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 Keratoconus Formatting of this note might be different from the original. Formatting of this note might be different from the original. Keratoconus Formatting of this note might be different from the original. Formatting of this note might be different from the original. Keratoconus Formatting of this note might be different from the or   Microhematuria 06/27/2015   Migraine 08/28/2014   Formatting of this note might be different from the original. Migraine Formatting of this note might be different from the original. Formatting of this note might be different from the original. Migraine Formatting of this note might be different from the original. Migraine Formatting of this note might be different from the original. Migraine Formatting of this note might be different from the or   Migraine without aura and without status migrainosus, not intractable 08/28/2014   Formatting of this note might be different from the original. Formatting of this note might be different from the original. Formatting of this note might be different from the original. Migraine Formatting of this note might be different from the original. Migraine Formatting of this note might be different from the original. Formatting of this note might be different from the original. Migraine F   Mild intermittent asthma without complication 10/30/2008   Formatting of this note  might be different from the original. Formatting of this note might be different from the original. Formatting of this note might be different from the original. Asthma Formatting of this note might be different from the original. Asthma Formatting of this note might be different from the original. Formatting of this note might be different from the original. 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 Formatting of this note might be different from the original. 01/25/2016 with Dr. Loney Pol at [facility]. [place] of this note might be different from the original. 01/25/2016 with Dr. Loney Pol at [facility]. [place] of this note might be different from the original. Formatting of this note might be different from    Seronegative inflammatory arthritis 08/28/2014   Formatting of this note might be different from the original. Formatting of this  note might be different from the original. Arthritis; diagnosed as IBD related - 2012 Formatting of this note might be different from the original. Followed by rheumatologist. Had to change providers due to an insurance change earlier this year resulting in patient not being able to have Remicade  for ~6 months. [facility]   Sero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 arthritis (HCC)    Slow transit  constipation 08/01/2019   Formatting of this note might be different from the original. Formatting of this note might be different from the original. Treated with PRN miralax . Recent colonoscopy 07/2019. Formatting of this note might be different from the original. Treated with PRN miralax . Recent colonoscopy 07/2019. Formatting of this note might be different from the original. Formatting of this note might be different f   Status post placement of ureteral stent 02/06/2021   Tooth infection 08/12/2020   Unstable angina (HCC) 07/29/2023   Ureteral stone 01/20/2022   Urinary tract infection, site not specified 06/26/2022    Past Surgical History:  Procedure Laterality Date   BACK SURGERY     L5-S1   CERVICAL CONE BIOPSY  04/2000   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   CORONARY STENT INTERVENTION N/A 07/01/2023   Procedure: CORONARY STENT INTERVENTION;  Surgeon: Darron Deatrice LABOR, MD;  Location: [facility];  Service: Cardiovascular;  Laterality: N/A;   EYE SURGERY     GASTRIC BYPASS  2017   Sleve   LAPAROSCOPIC GASTRIC SLEEVE RESECTION     LEFT HEART CATH AND CORONARY ANGIOGRAPHY N/A 07/01/2023   Procedure: LEFT HEART CATH AND CORONARY ANGIOGRAPHY;  Surgeon: Darron Deatrice LABOR, MD;  Location: [facility];  Service: Cardiovascular;  Laterality: N/A;   LEFT HEART CATH AND CORONARY ANGIOGRAPHY N/A 08/01/2023   Procedure: LEFT HEART CATH AND CORONARY ANGIOGRAPHY;  Surgeon: Ladona Heinz, MD;  Location: [facility];  Service: Cardiovascular;  Laterality: N/A;   Low back disc surgery     06/2000   TOTAL KNEE ARTHROPLASTY Right 11/2016    Current Medications: Current Meds  Medication Sig   acetaminophen  (TYLENOL ) 500 MG tablet Take 1,000 mg by mouth every 12 (twelve) hours as needed for mild pain or moderate pain.   albuterol  (PROVENTIL ) (2.5 MG/3ML) 0.083% nebulizer solution Take 3 mLs (2.5 mg total) by nebulization every 6 (six) hours as needed for wheezing or shortness of  breath.   amLODipine  (NORVASC ) 10 MG tablet Take 1 tablet (10 mg total) by mouth daily.   atorvastatin  (LIPITOR) 80 MG tablet Take 1 tablet (80 mg total) by mouth daily. (Patient not taking: Reported on 09/09/2023)   B Complex-C (B-COMPLEX WITH VITAMIN C) tablet Take 1 tablet by mouth daily.   [EXPIRED] cephALEXin  (KEFLEX ) 500 MG capsule Take 2 capsules (1,000 mg total) by mouth 2 (two) times daily after a meal for 7 days.   Cholecalciferol  (VITAMIN D3) 50 MCG (2000 UT) TABS Take 1 tablet by mouth daily.   EPINEPHrine  (EPIPEN  2-PAK) 0.3 mg/0.3 mL IJ SOAJ injection Inject 0.3 mg into the muscle as needed for anaphylaxis.   esomeprazole  (NEXIUM ) 40 MG capsule Take 1 capsule (40 mg total) by mouth 2 (two) times daily before a meal. (Patient taking differently: Take 80 mg by mouth daily.)   famotidine  (PEPCID ) 40 MG tablet Take 1 tablet (40 mg total) by mouth daily.   Golimumab (SIMPONI ARIA IV) Inject 200 mg into the vein every 8 (eight) weeks.   levocetirizine (XYZAL) 5 MG tablet Take 10 mg by mouth in the morning.  linaclotide  (LINZESS ) 145 MCG CAPS capsule Take 145 mcg by mouth daily before breakfast.   mirabegron  ER (MYRBETRIQ ) 50 MG TB24 tablet Take 1 tablet (50 mg total) by mouth daily.   montelukast  (SINGULAIR ) 10 MG tablet Take 1 tablet (10 mg total) by mouth at bedtime. (Patient taking differently: Take 10 mg by mouth daily.)   nitroGLYCERIN  (NITROSTAT ) 0.4 MG SL tablet Place 1 tablet (0.4 mg total) under the tongue every 5 (five) minutes as needed for chest pain.   oxyCODONE  (OXY IR/ROXICODONE ) 5 MG immediate release tablet Take 5 mg by mouth as needed for moderate pain (pain score 4-6) or severe pain (pain score 7-10). Very rarely   OXYGEN Inhale 2 L into the lungs at bedtime.   ticagrelor  (BRILINTA ) 90 MG TABS tablet Take 1 tablet (90 mg total) by mouth 2 (two) times daily.   ZOLMitriptan  (ZOMIG ) 2.5 MG tablet Take 1 tablet (2.5 mg total) by mouth once for 1 dose. May repeat in 2 hours if  headache persists or recurs. (Patient taking differently: Take 2.5 mg by mouth daily as needed for migraine.)     Allergies:   Albuterol  sulfate, Tape, Formoterol, Hydrocodone , Hydrocodone -acetaminophen , Nickel, Other, Oxycodone -acetaminophen , Salmeterol, Strawberry extract, Advair hfa [fluticasone-salmeterol], Proventil  hfa [albuterol ], Asa [aspirin ], Cleocin [clindamycin], Clindamycin/lincomycin, Fludrocortisone acetate, Gabapentin, Hydrochlorothiazide, Imipramine, Ipratropium bromide, Medroxyprogesterone, Meloxicam, Metformin and related, Nystatin, Penicillins, Pred forte [prednisolone], Prednisone, Solu-medrol  [methylprednisolone ], Sulfa antibiotics, Tegaderm ag mesh [silver], Toradol  [ketorolac  tromethamine ], and Tramadol   Social History   Socioeconomic History   Marital status: Married    Spouse name: Not on file   Number of children: 0   Years of education: Not on file   Highest education level: Professional school degree (e.g., MD, DDS, DVM, JD)  Occupational History   Not on file  Tobacco Use   Smoking status: Never    Passive exposure: Never   Smokeless tobacco: Never  Vaping Use   Vaping status: Never Used  Substance and Sexual Activity   Alcohol use: Yes    Comment: rare   Drug use: Never   Sexual activity: Yes  Other Topics Concern   Not on file  Social History Narrative   2 dogs    Social Drivers of Health   Financial Resource Strain: High Risk (07/14/2023)   Overall Financial Resource Strain (CARDIA)    Difficulty of Paying Living Expenses: Hard  Food Insecurity: No Food Insecurity (07/30/2023)   Hunger Vital Sign    Worried About Running Out of Food in the Last Year: Never true    Ran Out of Food in the Last Year: Never true  Transportation Needs: No Transportation Needs (07/30/2023)   PRAPARE - Administrator, Civil Service (Medical): No    Lack of Transportation (Non-Medical): No  Physical Activity: Unknown (07/14/2023)   Exercise Vital Sign     Days of Exercise per Week: 0 days    Minutes of Exercise per Session: Not on file  Stress: No Stress Concern Present (07/14/2023)   [employer] of Occupational Health - Occupational Stress Questionnaire    Feeling of Stress : Not at all  Social Connections: Moderately Isolated (07/14/2023)   Social Connection and Isolation Panel [NHANES]    Frequency of Communication with Friends and Family: More than three times a week    Frequency of Social Gatherings with Friends and Family: Once a week    Attends Religious Services: Never    Database Administrator or Organizations: No    Attends  Engineer, Structural: Not on file    Marital Status: Married     Family History: The patient's family history includes Cancer in an other family member; Diabetes in her brother, father, mother, paternal grandmother, and sister; Hypertension in her brother, father, mother, and sister. There is no history of Asthma, Allergic rhinitis, Atopy, or Eczema. She was adopted. ROS:   Please see the history of present illness.    All 14 point review of systems negative except as described per history of present illness  EKGs/Labs/Other Studies Reviewed:         Recent Labs: 11/30/2022: TSH 0.52 09/09/2023: ALT 20; B Natriuretic Peptide 57.0; Magnesium  1.8 09/10/2023: BUN 8; Creatinine, Ser 0.81; Hemoglobin 12.6; Platelets 357; Potassium 2.9; Sodium 142  Recent Lipid Panel    Component Value Date/Time   CHOL 249 (H) 09/06/2023 1120   TRIG 205 (H) 09/06/2023 1120   HDL 73 09/06/2023 1120   CHOLHDL 3.4 09/06/2023 1120   LDLCALC 140 (H) 09/06/2023 1120    Physical Exam:    VS:  BP (!) 136/92 (BP Location: Right Arm, Patient Position: Sitting)   Pulse 88   Ht 5' 3 (1.6 m)   Wt 223 lb 12.8 oz (101.5 kg)   SpO2 92%   BMI 39.64 kg/m     Wt Readings from Last 3 Encounters:  09/13/23 223 lb 12.8 oz (101.5 kg)  09/12/23 223 lb (101.2 kg)  09/10/23 223 lb 15.8 oz (101.6 kg)     GEN:  Well  nourished, well developed in no acute distress HEENT: Normal NECK: No JVD; No carotid bruits LYMPHATICS: No lymphadenopathy CARDIAC: RRR, no murmurs, no rubs, no gallops RESPIRATORY:  Clear to auscultation without rales, wheezing or rhonchi  ABDOMEN: Soft, non-tender, non-distended MUSCULOSKELETAL:  No edema; No deformity  SKIN: Warm and dry LOWER EXTREMITIES: no swelling NEUROLOGIC:  Alert and oriented x 3 PSYCHIATRIC:  Normal affect   ASSESSMENT:    1. Coronary artery disease of native artery of native heart with stable angina pectoris (HCC)   2. Essential hypertension   3. 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 S/P laparoscopic sleeve gastrectomy    PLAN:    In order of problems listed above:  Coronary disease stable from that point review on dual antiplatelets therapy which I will continue. Dyslipidemia I did review K PN which show me her LDL 140 HDL 73 obviously unacceptable will refer her to our pharmacist to initiate PCSK9 agent.  The reason why atorvastatin  has been with nose correction with her medication for fungal. Palpitations: Will schedule her to have Zio patch for 2 weeks to see exactly what kind of arrhythmia she is dealing with. Immunocompromisation related to her rheumatoid arthritis treatment.  Noted. Status post laparoscopic sleeve surgery.  Noted.   Medication Adjustments/Labs and Tests Ordered: Current medicines are reviewed at length with the patient today.  Concerns regarding medicines are outlined above.  No orders of the defined types were placed in this encounter.  Medication changes: No orders of the defined types were placed in this encounter.   Signed, Lamar DOROTHA Fitch, MD, [facility] 09/13/2023 1:23 PM    [facility]

## 2023-09-14 ENCOUNTER — Ambulatory Visit: Payer: 59

## 2023-09-14 ENCOUNTER — Encounter (HOSPITAL_COMMUNITY): Payer: 59

## 2023-09-14 VITALS — BP 123/79 | HR 82 | Temp 97.7°F | Resp 20 | Ht 63.0 in | Wt 222.6 lb

## 2023-09-14 DIAGNOSIS — B3781 Candidal esophagitis: Secondary | ICD-10-CM | POA: Diagnosis not present

## 2023-09-14 MED ORDER — SODIUM CHLORIDE 0.9 % IV SOLN
50.0000 mg | Freq: Once | INTRAVENOUS | Status: AC
  Administered 2023-09-14: 50 mg via INTRAVENOUS
  Filled 2023-09-14: qty 10

## 2023-09-14 NOTE — Progress Notes (Signed)
 Diagnosis: Esophageal candidiasis  Provider:  Mannam, Praveen MD  Procedure: IV Infusion  IV Type: Peripheral, IV Location: R Antecubital  Cancidas  (caspofungin ), Dose: 50 mg  Infusion Start Time: 1057  Infusion Stop Time: 1211  Post Infusion IV Care: Patient declined observation and Peripheral IV Discontinued  Discharge: Condition: Stable, Destination: Home . AVS Declined  Performed by:  Rocky FORBES Sar, RN

## 2023-09-15 ENCOUNTER — Ambulatory Visit (INDEPENDENT_AMBULATORY_CARE_PROVIDER_SITE_OTHER): Payer: 59

## 2023-09-15 ENCOUNTER — Ambulatory Visit: Payer: 59

## 2023-09-15 VITALS — BP 114/71 | HR 87 | Temp 98.5°F | Resp 16 | Ht 63.0 in | Wt 222.6 lb

## 2023-09-15 DIAGNOSIS — B3781 Candidal esophagitis: Secondary | ICD-10-CM

## 2023-09-15 MED ORDER — SODIUM CHLORIDE 0.9 % IV SOLN
50.0000 mg | Freq: Once | INTRAVENOUS | Status: AC
  Administered 2023-09-15: 50 mg via INTRAVENOUS
  Filled 2023-09-15: qty 10

## 2023-09-15 NOTE — Progress Notes (Signed)
 Diagnosis: Esophageal candidiasis  Provider:  Theophilus Roosevelt MD  Procedure: IV Infusion  IV Type: Peripheral, IV Location: L Antecubital  Cancidas  (caspofungin ), Dose: 50 mg  Infusion Start Time: 1330  Infusion Stop Time: 1435  Post Infusion IV Care: Patient declined observation and Peripheral IV Discontinued  Discharge: Condition: Fair, Destination: Home . AVS Declined  Performed by:  Rocky FORBES Sar, RN

## 2023-09-16 ENCOUNTER — Encounter (HOSPITAL_COMMUNITY): Payer: 59

## 2023-09-16 ENCOUNTER — Ambulatory Visit (INDEPENDENT_AMBULATORY_CARE_PROVIDER_SITE_OTHER): Payer: 59 | Admitting: Internal Medicine

## 2023-09-16 ENCOUNTER — Telehealth: Payer: Self-pay | Admitting: Cardiology

## 2023-09-16 ENCOUNTER — Encounter: Payer: Self-pay | Admitting: Internal Medicine

## 2023-09-16 ENCOUNTER — Other Ambulatory Visit: Payer: Self-pay

## 2023-09-16 VITALS — BP 126/85 | HR 97 | Temp 98.1°F | Ht 63.0 in | Wt 225.0 lb

## 2023-09-16 DIAGNOSIS — L0390 Cellulitis, unspecified: Secondary | ICD-10-CM | POA: Insufficient documentation

## 2023-09-16 DIAGNOSIS — R600 Localized edema: Secondary | ICD-10-CM | POA: Diagnosis not present

## 2023-09-16 DIAGNOSIS — B3781 Candidal esophagitis: Secondary | ICD-10-CM | POA: Diagnosis not present

## 2023-09-16 DIAGNOSIS — L03119 Cellulitis of unspecified part of limb: Secondary | ICD-10-CM | POA: Diagnosis not present

## 2023-09-16 MED ORDER — EPLERENONE 25 MG PO TABS: 25.0000 mg | ORAL_TABLET | Freq: Every day | ORAL | 2 refills | Status: DC

## 2023-09-16 NOTE — Telephone Encounter (Signed)
 Patient called in to see if RN found out the type of adhesive used for heart monitor discussed during her appointment. She also mentions increased swelling. She just left infectious disease doctor and they informed her it is probably not related to cellulitis and to follow up with PCP + cardiology, but PCP is closed.  Pt c/o swelling: STAT is pt has developed SOB within 24 hours  How much weight have you gained and in what time span?  No weight gain   If swelling, where is the swelling located?  Both legs and feet   Are you currently taking a fluid pill?  Patient states she doesn't believe so   Are you currently SOB?  No   Do you have a log of your daily weights (if so, list)?    Have you gained 3 pounds in a day or 5 pounds in a week?   Have you traveled recently?

## 2023-09-16 NOTE — Addendum Note (Signed)
 Addended by: Jevaughn Degollado P on: 09/16/2023 04:44 PM   Modules accepted: Orders

## 2023-09-16 NOTE — Progress Notes (Signed)
   Subjective:    Patient ID: Casey [name], female    DOB: 03/06/1971, 53 y.o.   MRN: [id number]  HPI Casey [name] is here for follow-up of candidal esophagitis. She has a history RF 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 arthritis on Remicade  multiple endoscopies with positive biopsies for Candida and no resistance noted seen here for symptoms of candidal esophagitis and treated with IV caspofungin  for 10 days.  She feels improved.  She was diagnosed with cellulitis of first to right leg, then her left leg and has been on cephalexin  and now doxycycline .  She has had significant swelling of both legs.   Review of Systems  Constitutional:  Negative for chills, fatigue and fever.  Cardiovascular:  Positive for leg swelling.       Objective:   Physical Exam HENT:     Mouth/Throat:     Mouth: Mucous membranes are moist.     Pharynx: Oropharynx is clear. No oropharyngeal exudate or posterior oropharyngeal erythema.  Eyes:     General: No scleral icterus. Neurological:     Mental Status: She is alert.   SH: no tobacco        Assessment & Plan:

## 2023-09-16 NOTE — Telephone Encounter (Signed)
Patient calling back for update. Please advise  

## 2023-09-16 NOTE — Assessment & Plan Note (Signed)
 No signs of cellulitis at this time with no erythema in either leg.  Some slight warmth due to edema.  She can stop antibiotics

## 2023-09-16 NOTE — Telephone Encounter (Signed)
 Called and spoke to patient regarding her leg swelling. Patient reports both of her legs are swollen, and that she is unsure if she has gained any weight due to her not owning a scale. Patient denies any shortness of breath. Informed patient that Dr. Krasowski recommendation is for her to have BMP and ProBNP done today, if possible. Patient asked what she should do while waiting on lab results, and patient was advised to go to the ER if her swelling increased, she had any shortness of breath or any other symptoms. Patient stated that she is frustrated due to her lab work always being normal. Patient ended the call.

## 2023-09-16 NOTE — Assessment & Plan Note (Signed)
 To medically improved and no new concerns.  No further antifungal required at this time.  She can follow-up with Dr. Levern Reader as needed

## 2023-09-16 NOTE — Telephone Encounter (Addendum)
 Spoke to Dr. Krasowski regarding patient stating that she frustrated. Dr. Krasowski gave verbal orders for Potassium 40 meq today, and then Potassium 20 meq daily after today and Lasix  20 mg daily.  Called patient and reviewed Dr. Bernie recommendations. Patient reports she is not able to take any potassium supplement orally due to it causing her to vomit. Dr. Karry new recommendation is for patient to take Spironolactone 25 mg once a day and come in Monday for blood work. Patient has an allergy  to a component in Spironolactone . Dr. Krasowski gave orders for Inspra  25 mg once daily. Patient agreeable with this. She verbalized understanding and had no further questions. Rx sent to pharmacy.

## 2023-09-16 NOTE — Assessment & Plan Note (Signed)
 She has edema in both legs of unknown etiology.  She does follow with cardiology and no report of heart failure.  I recommended elevation and compression stockings.  Will follow with her primary doctor for this

## 2023-09-19 ENCOUNTER — Telehealth: Payer: Self-pay | Admitting: Cardiology

## 2023-09-19 ENCOUNTER — Encounter (HOSPITAL_COMMUNITY): Payer: 59

## 2023-09-19 ENCOUNTER — Other Ambulatory Visit: Payer: Self-pay

## 2023-09-19 MED ORDER — ATORVASTATIN CALCIUM 80 MG PO TABS: 80.0000 mg | ORAL_TABLET | Freq: Every day | ORAL | 2 refills | Status: DC

## 2023-09-19 NOTE — Telephone Encounter (Signed)
 Pt wants to switch from Dr Krasowski to Dr Alroy Aspen . Please advise

## 2023-09-20 ENCOUNTER — Telehealth: Payer: Self-pay | Admitting: Cardiology

## 2023-09-20 LAB — BASIC METABOLIC PANEL
BUN/Creatinine Ratio: 10 (ref 9–23)
BUN: 9 mg/dL (ref 6–24)
CO2: 25 mmol/L (ref 20–29)
Calcium: 9.7 mg/dL (ref 8.7–10.2)
Chloride: 103 mmol/L (ref 96–106)
Creatinine, Ser: 0.86 mg/dL (ref 0.57–1.00)
Glucose: 104 mg/dL — ABNORMAL HIGH (ref 70–99)
Potassium: 3.3 mmol/L — ABNORMAL LOW (ref 3.5–5.2)
Sodium: 143 mmol/L (ref 134–144)
eGFR: 80 mL/min/{1.73_m2} (ref >59)

## 2023-09-20 LAB — PRO B NATRIURETIC PEPTIDE: NT-Pro BNP: 153 pg/mL (ref 0–249)

## 2023-09-20 NOTE — Telephone Encounter (Signed)
Called and spoke to patient who stated she wanted to know how she was going to get a potassium IV infusion due to her seeing the result in mychart. Advised patient that Dr. Bing Matter did not order this or recommend this. If she wanted a potassium infusion she would need to go to the ER. Patient verbalized understanding and had no further questions.

## 2023-09-20 NOTE — Telephone Encounter (Signed)
Patient stated she is returning RN's call.

## 2023-09-21 ENCOUNTER — Encounter (HOSPITAL_BASED_OUTPATIENT_CLINIC_OR_DEPARTMENT_OTHER): Payer: Self-pay | Admitting: Student

## 2023-09-21 ENCOUNTER — Encounter (HOSPITAL_COMMUNITY): Payer: 59

## 2023-09-21 ENCOUNTER — Ambulatory Visit (INDEPENDENT_AMBULATORY_CARE_PROVIDER_SITE_OTHER): Payer: 59 | Admitting: Student

## 2023-09-21 VITALS — BP 136/74 | HR 84 | Temp 97.9°F | Ht 63.0 in | Wt 224.3 lb

## 2023-09-21 DIAGNOSIS — R600 Localized edema: Secondary | ICD-10-CM | POA: Diagnosis not present

## 2023-09-21 DIAGNOSIS — E876 Hypokalemia: Secondary | ICD-10-CM | POA: Insufficient documentation

## 2023-09-21 NOTE — Patient Instructions (Addendum)
It was nice to see you today!  As we discussed in clinic come back in one week to recheck potassium- go to the ER if you have any concerning symptoms.   I encourage you to buy some compression stockings online at this time and give them a try before next visit so you can let me know how it is going.  If you have any problems before your next visit feel free to message me via MyChart (minor issues or questions) or call the office, otherwise you may reach out to schedule an office visit.  Thank you! Gerilyn Pilgrim Espyn Radwan, PA-C

## 2023-09-21 NOTE — Assessment & Plan Note (Signed)
Clinically stable appearance, nonemergent. Has been low the last several times she has had potassium drawn.  Last potassium was only slightly below normal at 3.3. Encouraged to increase dietary potassium. With dietary intervention, we will plan to redraw potassium in about 1 week.

## 2023-09-21 NOTE — Assessment & Plan Note (Addendum)
She continues to have bilateral lower extremity edema.  I suspect that this is likely secondary to venous insufficiency.  Possible slight appearance of hemosiderin deposition on bilateral lower extremities.  No ulcerations of lower extremities currently.  Patient has been instructed to buy compression stockings to wear and to prop legs up at the end of the day.  Patient was encouraged to continue leg exercises at home.

## 2023-09-21 NOTE — Progress Notes (Signed)
Established Patient Office Visit  Subjective   Patient ID: JEANNA GIUFFRE, female    DOB: [birth year]  Age: 53 y.o. MRN: [id number]  Chief Complaint  Patient presents with   hypokalemia    Need to discuss K+ being low since 07/2023. Needs frequent IV infusions due to intolerance to tablets/liquid. Having palpitations. Last time it was checked it was 2.9. Cardio said it was at 3.3 last week. Both legs are swollen. Was told she no longer has cellulitis. Was standing while cutting meat earlier for dogs. Would like to have K+ checked on a regular basis. Does not want to be having to go to ER. Husband just had an amputation.     HPI  Hypokalemia-she states that this was last an issue back around 2019.  Currently, patient notes that this episode started back in December 2024 in which she has had potassium come back low several times. 09/10/23 had a k of 2.9.  Patient states that she got 2-3 bags of K in the emergency department due to this.  Her next potassium level was on 09/19/23 which resulted in a k of 3.3. PO potassium has caused her to projectile vomit in the past.  Patient desires and order for potassium to be given at an infusion center if necessary.  Discussed increasing dietary potassium at this time and rechecking in about a week.  Patient will see nephrology as below, if this ends up being a chronic issue, we will defer to them.  LE swelling/Likely Venous insufficiency-seen by cardiology, patient was given a clean bill by them with a proBNP of only about 153.  She had cellulitis in her right leg before, this has healed but she continues to get swelling bilaterally with slightly more noted in her right leg.  Leg swelling is noted predominantely with walking or standing. Notes that when she has had swelling go down when she props up her legs in the evening. Patient notes that she has been looking into compression socks online, encouraged patient to buy compression socks.  Encouraged patient to  continue to prop up legs at the end of the day.  Encouraged patient to continue leg exercises at home to help with this.  [facility] appointment on 2/27.    ROS Negative unless indicated in HPI.    Objective:     BP 136/74 (BP Location: Right Arm, Patient Position: Sitting, Cuff Size: Normal)   Pulse 84   Temp 97.9 F (36.6 C) (Oral)   Ht 5\' 3"  (1.6 m)   Wt 224 lb 4.8 oz (101.7 kg)   SpO2 98%   BMI 39.73 kg/m    Physical Exam Constitutional:      General: She is not in acute distress.    Appearance: Normal appearance. She is not ill-appearing.  HENT:     Head: Normocephalic and atraumatic.     Mouth/Throat:     Mouth: Mucous membranes are moist.     Pharynx: Oropharynx is clear.     Comments: No signs of thrush Eyes:     General: No scleral icterus.    Extraocular Movements: Extraocular movements intact.     Conjunctiva/sclera: Conjunctivae normal.  Cardiovascular:     Rate and Rhythm: Normal rate and regular rhythm.     Pulses: Normal pulses.     Heart sounds: Normal heart sounds. No murmur heard.    No friction rub.  Pulmonary:     Effort: Pulmonary effort is normal. No respiratory distress.  Breath sounds: Normal breath sounds. No wheezing, rhonchi or rales.  Musculoskeletal:        General: Normal range of motion.     Right lower leg: Edema (1+ pitting) present.     Left lower leg: Edema (1+ pitting) present.  Skin:    General: Skin is warm and dry.     Coloration: Skin is not jaundiced or pale.     Comments: Possible slight hemosiderin deposition on lower extremities bilaterally, without ulcerations.  Cellulitis healed, no signs of recurrence.  Neurological:     General: No focal deficit present.     Mental Status: She is alert.  Psychiatric:        Mood and Affect: Mood normal.        Behavior: Behavior normal.      No results found for any visits on 09/21/23.    The 10-year ASCVD risk score (Arnett DK, et al., 2019) is: 5%     Assessment & Plan:   Bilateral lower extremity edema Assessment & Plan: She continues to have bilateral lower extremity edema.  I suspect that this is likely secondary to venous insufficiency.  Possible slight appearance of hemosiderin deposition on bilateral lower extremities.  No ulcerations of lower extremities currently.  Patient has been instructed to buy compression stockings to wear and to prop legs up at the end of the day.  Patient was encouraged to continue leg exercises at home.   Hypokalemia Assessment & Plan: Clinically stable appearance, nonemergent. Has been low the last several times she has had potassium drawn.  Last potassium was only slightly below normal at 3.3. Encouraged to increase dietary potassium. With dietary intervention, we will plan to redraw potassium in about 1 week.     I have spent greater than 40 minutes charting, educating, diagnosing and managing this patient for this visit.   Return for Chronic Followup- LE edema.    Teryl Lucy Michaella Imai, PA-C

## 2023-09-23 ENCOUNTER — Encounter (HOSPITAL_BASED_OUTPATIENT_CLINIC_OR_DEPARTMENT_OTHER): Payer: Self-pay

## 2023-09-23 ENCOUNTER — Encounter (HOSPITAL_COMMUNITY): Payer: 59

## 2023-09-23 ENCOUNTER — Ambulatory Visit (HOSPITAL_BASED_OUTPATIENT_CLINIC_OR_DEPARTMENT_OTHER)
Admission: RE | Admit: 2023-09-23 | Discharge: 2023-09-23 | Disposition: A | Discharge status: Home / Self Care | Payer: 59 | Source: Ambulatory Visit

## 2023-09-23 ENCOUNTER — Ambulatory Visit (HOSPITAL_BASED_OUTPATIENT_CLINIC_OR_DEPARTMENT_OTHER): Payer: Self-pay | Admitting: Student

## 2023-09-23 VITALS — BP 147/76 | HR 83 | Temp 98.0°F | Resp 20

## 2023-09-23 DIAGNOSIS — I872 Venous insufficiency (chronic) (peripheral): Secondary | ICD-10-CM

## 2023-09-23 NOTE — ED Triage Notes (Signed)
Diagnosed with cellulitis of RLE approx 3 weeks ago. Both legs became affected. States has finished treatment and has followed up with infectious disease and cleared. Patient states bilat swelling has returned and now feeling  "warmth" to RLE.  Extremities showing 2+ edema. Pain with ambulation.

## 2023-09-23 NOTE — Telephone Encounter (Signed)
  Chief Complaint: Leg swelling  Symptoms: Leg swelling, leg pain, leg redness  Frequency: Constant  Pertinent Negatives: Patient denies fever Disposition: [] ED /[x] Urgent Care (no appt availability in office) / [] Appointment(In office/virtual)/ []  [facility]/ [] Home Care/ [] Refused Recommended Disposition /[] [facility]/ []  Follow-up with PCP Additional Notes: Patients states that approximately 2 weeks ago she was seen for leg swelling and redness and treated for cellulitis. She states that her symptoms resolved except for the leg swelling. She states that today her legs became red and warm to the touch again and she is concerned the cellulitis returned. Appointment made with urgent care as there were no available visits in the office.     Reason for Disposition  [1] Red area or streak [2] large (> 2 in. or 5 cm)  Answer Assessment - Initial Assessment Questions 1. ONSET: "When did the swelling start?" (e.g., minutes, hours, days)     Swelling has been a constant problem, redness and warmth returned today  2. LOCATION: "What part of the leg is swollen?"  "Are both legs swollen or just one leg?"     Both legs, right worse than left  3. SEVERITY: "How bad is the swelling?" (e.g., localized; mild, moderate, severe)   - Localized: Small area of swelling localized to one leg.   - MILD pedal edema: Swelling limited to foot and ankle, pitting edema < 1/4 inch (6 mm) deep, rest and elevation eliminate most or all swelling.   - MODERATE edema: Swelling of lower leg to knee, pitting edema > 1/4 inch (6 mm) deep, rest and elevation only partially reduce swelling.   - SEVERE edema: Swelling extends above knee, facial or hand swelling present.      Moderate swelling up to knees 4. REDNESS: "Does the swelling look red or infected?"     Yes, redness  5. PAIN: "Is the swelling painful to touch?" If Yes, ask: "How painful is it?"   (Scale 1-10; mild, moderate or severe)     5/10 6.  FEVER: "Do you have a fever?" If Yes, ask: "What is it, how was it measured, and when did it start?"      Chills, no recorded fever  7. CAUSE: "What do you think is causing the leg swelling?"     Just treated for cellulitis  8. 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 RECURRENT SYMPTOM: "Have you had leg swelling before?" If Yes, ask: "When was the last time?" "What happened that time?"     Not before having cellulitis 2 week  10. OTHER SYMPTOMS: "Do you have any other symptoms?" (e.g., chest pain, difficulty breathing)       Intermittent difficulty breathing  11. PREGNANCY: "Is there any chance you are pregnant?" "When was your last menstrual period?"       No  Protocols used: Leg Swelling and Edema-A-AH

## 2023-09-23 NOTE — Telephone Encounter (Signed)
Copied from CRM [id number]. Topic: Clinical - Red Word Triage >> Sep 23, 2023 11:34 AM Kristie Cowman wrote: Red Word that prompted transfer to Nurse Triage: The patient is having swelling, pain, redness in both lower legs.  Warm to touch, especially her right leg even with compression socks on.  Erroneous encounter. Please see previous nurse triage note.

## 2023-09-23 NOTE — ED Provider Notes (Signed)
 [name]    CSN: [id number] Arrival date & time: 09/23/23  1332      History   Chief Complaint Chief Complaint  Patient presents with   Leg Swelling    Recently treated for cellulitis - Entered by patient    HPI Casey [name] is a 53 y.o. female.  Concerns for cellulitis of bilateral lower legs She has history of lower leg swelling. On 1/16 treated with Keflex for possible cellulitis.  However returned to the ED 1/31 and treated with doxycycline.  She finished the full week of antibiotics and symptoms improved.  Yesterday she noticed some warmth and redness of the bilateral lower extremities.  She wanted to make sure it was not cellulitis again  She has been seeing her PCP for leg swelling.  He recommended compression stockings.  She just got some yesterday. She does report swelling improves when legs are elevated, and worsens when she stands for long periods of time  She denies fever, calf pain, numbness or tingling   Past Medical History:  Diagnosis Date   Albuminuria 06/27/2015   Anxiety 11/04/2014   Arm DVT (deep venous thromboembolism), acute, left (HCC) 08/17/2022   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/30/2014   Asthma    Atopic dermatitis 10/30/2008   Formatting of this note might be different from the original. Atopic dermatitis Formatting of this note might be different from the original. Formatting of this note might be different from the original. Atopic dermatitis Formatting of this note might be different from the original. Formatting of this note might be different from the original. Formatting of this note might be different from the or   Cataract 05/06/2015   Contusion of left wrist 07/08/2021   Coronary artery disease s/p PCI/DES to LAD 07/01/2023 12/28/2022   Crohn's disease (HCC) 04/21/2015   De Quervain's tenosynovitis, left 07/31/2021   Diabetes mellitus without complication (HCC)    Diabetes type 2, controlled (HCC)  04/21/2015   Dry eyes 05/06/2015   DVT (deep venous thrombosis) (HCC)    Dysfunction of both eustachian tubes 08/31/2012   Encounter for monitoring immunomodulating therapy 11/20/2021   Environmental allergies 08/12/2020   Esophageal candidiasis (HCC) 06/26/2022   Essential hypertension 06/27/2015   Fibrositis 08/28/2014   Formatting of this note might be different from the original. Fibromyalgia Formatting of this note might be different from the original. Formatting of this note might be different from the original. Fibromyalgia   Flank pain 11/24/2020   Gastroesophageal reflux disease with esophagitis without hemorrhage 08/01/2019   Formatting of this note might be different from the original. Formatting of this note might be different from the original. 07/2019: chronic symptoms of esophageal reflux since prior to sleeve gastrectomy surgery. Symptoms have worsened over the last 6 months with recent EGD findings of grade B esophagitis, irregular z line, erythematous mucosa, and evidence of sleeve gastrectomy. 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 Formatting of this note might be different from the original. 07/2019: 8 mm colonic polyp removed during colonoscopy, biopsy showed serrated polyp. Repeat colonoscopy recommended in one year. Formatting of this note might be different from the original. Added automatically from request for surgery [phone] Formatting of this note might b  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   History of kidney stones 08/12/2020   Hypercholesterolemia 10/07/2022   Hypertension, essential, benign 06/27/2015   Hypertensive disorder 08/28/2014   Formatting of this note might be different from the original.  Hypertension Formatting of this note might be different from the original. Formatting of this note might be different from the original. 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 Keratoconus Formatting of this note might be different from the original. Formatting of this note might be different from the original. Keratoconus Formatting of this note might be different from the original. Formatting of this note might be different from the original. Keratoconus Formatting of this note might be different from the or   Microhematuria 06/27/2015   Migraine 08/28/2014   Formatting of this note might be different from the original. Migraine Formatting of this note might be different from the original. Formatting of this note might be different from the original. Migraine Formatting of this note might be different from the original. Migraine Formatting of this note might be different from the original. Migraine Formatting of this note might be different from the or   Migraine without aura and without status migrainosus, not intractable 08/28/2014   Formatting of this note might be different from the original. Formatting of this note might be different from the original. Formatting of this note might be different from the original. Migraine Formatting of this note might be different from the original. Migraine Formatting of this note might be different from the original. Formatting of this note might be different from the original. Migraine F   Mild intermittent asthma without complication 10/30/2008   Formatting of this note might be different from the original. Formatting of this note might be different from the original. Formatting of this note might be different from the original. Asthma Formatting of this note might be different from the  original. Asthma Formatting of this note might be different from the original. Formatting of this note might be different from the original. 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 Formatting of this note might be different from the original. 01/25/2016 with Dr. Dionisio David at [facility]. [place] of this note might be different from the original. 01/25/2016 with Dr. Dionisio David at [facility]. [place] of this note might be different from the original. Formatting of this note might be different from    Seronegative inflammatory arthritis 08/28/2014   Formatting of this note might be different from the original. Formatting of this note might be different from the original. Arthritis; diagnosed as IBD related - 2012 Formatting of this note might be different from the original. Followed by rheumatologist. Had to change providers due to an insurance change earlier  this year resulting in patient not being able to have Remicade for ~6 months. 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 Formatting of this note might be different from the original. Treated with PRN miralax. Recent colonoscopy 07/2019. Formatting of this note  might be different from the original. Treated with PRN miralax. Recent colonoscopy 07/2019. Formatting of this note might be different from the original. Formatting of this note might be different f   Status post placement of ureteral stent 02/06/2021   Tooth infection 08/12/2020   Unstable angina (HCC) 07/29/2023   Ureteral stone 01/20/2022   Urinary tract infection, site not specified 06/26/2022    Patient Active Problem List   Diagnosis Date Noted   Hypokalemia 09/21/2023   Cellulitis 09/16/2023   Bilateral lower extremity edema 09/16/2023   Unstable angina (HCC) 07/29/2023   Moderate persistent asthma 07/29/2023   History of DVT (deep vein thrombosis) 06/30/2023   Chest pain, rule out acute myocardial infarction 06/29/2023   Nocturnal hypoxemia 06/13/2023   Oropharyngeal candidiasis 01/02/2023   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   Coronary artery disease s/p PCI/DES to LAD 07/01/2023 12/28/2022   Precordial chest pain 10/13/2022   Hyperlipidemia 10/07/2022   Hiatal hernia 10/07/2022   Esophageal candidiasis (HCC) 06/26/2022   Urinary tract infection, site not specified 06/26/2022   Ureteral stone 01/20/2022   Encounter for monitoring immunomodulating therapy 11/20/2021   Rheumatoid arthritis of multiple sites with 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 factor (HCC) 11/16/2021   Pyelonephritis 09/25/2021   De Quervain's tenosynovitis, left 07/31/2021   Contusion of left wrist 07/08/2021   Status post placement of ureteral stent 02/06/2021   Proteinuria 08/12/2020   Environmental allergies 08/12/2020   History of kidney stones 08/12/2020   Tooth infection 08/12/2020   Gastroesophageal reflux disease with esophagitis without hemorrhage 08/01/2019   Slow transit constipation 08/01/2019   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   History of colon polyps 06/19/2019   Irregular astigmatism of both eyes 10/11/2018   Nephrolithiasis 09/08/2018   Organic sleep related movement  disorder 10/07/2017   Plantar wart 11/04/2016   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   Myopia with astigmatism and presbyopia 05/07/2016   Overactive bladder 11/18/2015   Incontinence 10/07/2015   Albuminuria 06/27/2015   Essential hypertension 06/27/2015   Microhematuria 06/27/2015   Hematuria 06/18/2015   Cataract 05/06/2015   Dry eyes 05/06/2015   Crohn's disease (HCC) 04/21/2015   Osteoarthritis of both knees 04/21/2015   Right knee pain 04/21/2015   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/30/2014   History of abnormal cervical Pap smear 11/29/2014   Fibrositis 08/28/2014   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   Migraine without aura and without status migrainosus, not intractable 08/28/2014   Migraine 08/28/2014   Seronegative inflammatory arthritis 08/28/2014   Increased urinary frequency 07/16/2013   Dysfunction of both eustachian tubes 08/31/2012   Asthma 10/30/2008   Mild intermittent asthma without complication 10/30/2008    Past Surgical History:  Procedure Laterality Date   BACK SURGERY     L5-S1   CERVICAL CONE BIOPSY  04/2000   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   CORONARY STENT INTERVENTION N/A 07/01/2023   Procedure: CORONARY STENT INTERVENTION;  Surgeon: Iran Ouch, MD;  Location: [facility];  Service: Cardiovascular;  Laterality: N/A;   EYE SURGERY     GASTRIC BYPASS  2017   Sleve   LAPAROSCOPIC GASTRIC SLEEVE  RESECTION     LEFT HEART CATH AND CORONARY ANGIOGRAPHY N/A 07/01/2023   Procedure: LEFT HEART CATH AND CORONARY ANGIOGRAPHY;  Surgeon: Iran Ouch, MD;  Location: [facility];  Service: Cardiovascular;  Laterality: N/A;   LEFT HEART CATH AND CORONARY ANGIOGRAPHY N/A 08/01/2023   Procedure: LEFT HEART CATH AND CORONARY ANGIOGRAPHY;  Surgeon: Yates Decamp, MD;  Location: [facility];  Service: Cardiovascular;  Laterality: N/A;   Low back disc surgery     06/2000   TOTAL KNEE ARTHROPLASTY Right 11/2016    OB  History     Gravida  0   Para  0   Term  0   Preterm  0   AB  0   Living  0      SAB  0   IAB  0   Ectopic  0   Multiple  0   Live Births  0            Home Medications    Prior to Admission medications   Medication Sig Start Date End Date Taking? Authorizing Provider  acetaminophen (TYLENOL) 500 MG tablet Take 1,000 mg by mouth every 12 (twelve) hours as needed for mild pain or moderate pain.    [provider]  albuterol (PROVENTIL) (2.5 MG/3ML) 0.083% nebulizer solution Take 3 mLs (2.5 mg total) by nebulization every 6 (six) hours as needed for wheezing or shortness of breath. 11/30/22   Olive Bass, FNP  amLODipine (NORVASC) 10 MG tablet Take 1 tablet (10 mg total) by mouth daily. 08/08/23 11/06/23  Georgeanna Lea, MD  atorvastatin (LIPITOR) 80 MG tablet Take 1 tablet (80 mg total) by mouth daily. 09/19/23   Georgeanna Lea, MD  B Complex-C (B-COMPLEX WITH VITAMIN C) tablet Take 1 tablet by mouth daily. 09/06/19   [provider]  Cholecalciferol (VITAMIN D3) 50 MCG (2000 UT) TABS Take 1 tablet by mouth daily.    [provider]  EPINEPHrine (EPIPEN 2-PAK) 0.3 mg/0.3 mL IJ SOAJ injection Inject 0.3 mg into the muscle as needed for anaphylaxis. 01/18/23   Marcelyn Bruins, MD  eplerenone (INSPRA) 25 MG tablet Take 1 tablet (25 mg total) by mouth daily. 09/16/23   Georgeanna Lea, MD  esomeprazole (NEXIUM) 40 MG capsule Take 1 capsule (40 mg total) by mouth 2 (two) times daily before a meal. Patient taking differently: Take 80 mg by mouth daily. 11/30/22   Olive Bass, FNP  famotidine (PEPCID) 40 MG tablet Take 1 tablet (40 mg total) by mouth daily. 11/30/22   Olive Bass, FNP  Golimumab [facility] ARIA IV) Inject 200 mg into the vein every 8 (eight) weeks.    [provider]  levocetirizine (XYZAL) 5 MG tablet Take 10 mg by mouth in the morning.    [provider]  linaclotide  Karlene Einstein) 145 MCG CAPS capsule Take 145 mcg by mouth daily before breakfast. 03/23/23   [provider]  mirabegron ER (MYRBETRIQ) 50 MG TB24 tablet Take 1 tablet (50 mg total) by mouth daily. 11/30/22   Olive Bass, FNP  montelukast (SINGULAIR) 10 MG tablet Take 1 tablet (10 mg total) by mouth at bedtime. Patient taking differently: Take 10 mg by mouth daily. 11/30/22   Olive Bass, FNP  nitroGLYCERIN (NITROSTAT) 0.4 MG SL tablet Place 1 tablet (0.4 mg total) under the tongue every 5 (five) minutes as needed for chest pain. 10/13/22   Georgeanna Lea, MD  oxyCODONE (OXY IR/ROXICODONE) 5 MG immediate release tablet Take 5 mg by mouth as needed for moderate pain (pain score 4-6) or severe pain (pain score 7-10). Very rarely    [provider]  OXYGEN Inhale 2 L into the lungs at bedtime.    [provider]  ticagrelor (BRILINTA) 90 MG TABS tablet Take 1 tablet (90 mg total) by mouth 2 (two) times daily. 07/01/23   Lonia Blood, MD  ZOLMitriptan (ZOMIG) 2.5 MG tablet Take 1 tablet (2.5 mg total) by mouth once for 1 dose. May repeat in 2 hours if headache persists or recurs. Patient taking differently: Take 2.5 mg by mouth daily as needed for migraine. 11/30/22 09/16/23  Olive Bass, FNP    Family History Family History  Adopted: Yes  Problem Relation Age of Onset   Diabetes Mother    Hypertension Mother    Diabetes Father    Hypertension Father    Hypertension Sister    Diabetes Sister    Hypertension Brother    Diabetes Brother    Diabetes Paternal Grandmother    Cancer Other    Asthma Neg Hx    Allergic rhinitis Neg Hx    Atopy Neg Hx    Eczema Neg Hx     Social History Social History   Tobacco Use   Smoking status: Never    Passive exposure: Never   Smokeless tobacco: Never  Vaping Use   Vaping status: Never Used  Substance Use Topics   Alcohol use: Yes    Comment: rare   Drug use: Never     Allergies    Albuterol sulfate, Tape, Formoterol, Hydrocodone, Hydrocodone-acetaminophen, Nickel, Other, Oxycodone-acetaminophen, Salmeterol, Strawberry extract, Advair hfa [fluticasone-salmeterol], Proventil hfa [albuterol], Asa [aspirin], Cleocin [clindamycin], Clindamycin/lincomycin, Fludrocortisone acetate, Gabapentin, Hydrochlorothiazide, Imipramine, Ipratropium bromide, Medroxyprogesterone, Meloxicam, Metformin and related, Nystatin, Penicillins, Pred forte [prednisolone], Prednisone, Solu-medrol [methylprednisolone], Sulfa antibiotics, Tegaderm ag mesh [silver], Toradol [ketorolac tromethamine], and Tramadol   Review of Systems Review of Systems Per HPI  Physical Exam Triage Vital Signs ED Triage Vitals  Encounter Vitals Group     BP 09/23/23 1413 (!) 147/76     Systolic BP Percentile --      Diastolic BP Percentile --      Pulse Rate 09/23/23 1413 83     Resp 09/23/23 1413 20     Temp 09/23/23 1413 98 F (36.7 C)     Temp Source 09/23/23 1413 Oral     SpO2 09/23/23 1413 98 %     Weight --      Height --      Head Circumference --      Peak Flow --      Pain Score 09/23/23 1415 7     Pain Loc --      Pain Education --      Exclude from Growth Chart --    No data found.  Updated Vital Signs BP (!) 147/76 (BP Location: Right Arm)   Pulse 83   Temp 98 F (36.7 C) (Oral)   Resp 20   SpO2 98%   Visual Acuity Right Eye Distance:   Left Eye Distance:   Bilateral Distance:    Right Eye Near:   Left Eye Near:    Bilateral Near:     Physical Exam Vitals and nursing note reviewed.  Constitutional:      General: She is not in acute distress. HENT:     Mouth/Throat:     Pharynx: Oropharynx  is clear.  Cardiovascular:     Rate and Rhythm: Normal rate and regular rhythm.     Pulses: Normal pulses.     Heart sounds: Normal heart sounds.  Pulmonary:     Effort: Pulmonary effort is normal.     Breath sounds: Normal breath sounds.  Musculoskeletal:     Cervical back: Normal  range of motion.     Right lower leg: Edema present.     Left lower leg: Edema present.     Comments: There is 1+ edema in the bilateral lower legs, not pitting.  The right foot has a little more swelling than the left.  There is no swelling or tenderness of the calves.  No erythema or warmth.  No rash or lesions.  Sensation intact distally, DP pulses 2+  Skin:    Capillary Refill: Capillary refill takes less than 2 seconds.  Neurological:     Mental Status: She is alert and oriented to person, place, and time.      UC Treatments / Results  Labs (all labs ordered are listed, but only abnormal results are displayed) Labs Reviewed - No data to display  EKG  Radiology No results found.  Procedures Procedures (including critical care time)  Medications Ordered in UC Medications - No data to display  Initial Impression / Assessment and Plan / UC Course  I have reviewed the triage vital signs and the nursing notes.  Pertinent labs & imaging results that were available during my care of the patient were reviewed by me and considered in my medical decision making (see chart for details).  Well-appearing, afebrile, stable vitals Discussed with patient there is no sign of cellulitis or infection on her exam at this time.  I suspect she has venous insufficiency.  She just purchased compression stockings yesterday, recommend wearing them as much as possible and elevating her legs whenever she can.  She has a follow-up with her primary care next week and will touch base with them.  We did discuss reasons to return or be seen in the emergency department.  Patient is agreeable to plan  Final Clinical Impressions(s) / UC Diagnoses   Final diagnoses:  Venous insufficiency of both lower extremities     Discharge Instructions      Please wear compression stockings as often as possible Elevate the legs whenever you have a chance to further reduce swelling Please follow-up with your  primary care regarding symptoms. If at any point symptoms become severe or worsen, go to the emergency department     ED Prescriptions   None    PDMP not reviewed this encounter.   Marlow Baars, [place] 09/23/23 1546

## 2023-09-23 NOTE — Discharge Instructions (Addendum)
Please wear compression stockings as often as possible Elevate the legs whenever you have a chance to further reduce swelling Please follow-up with your primary care regarding symptoms. If at any point symptoms become severe or worsen, go to the emergency department

## 2023-09-26 ENCOUNTER — Encounter (HOSPITAL_COMMUNITY): Payer: 59

## 2023-09-26 ENCOUNTER — Telehealth (HOSPITAL_BASED_OUTPATIENT_CLINIC_OR_DEPARTMENT_OTHER): Payer: Self-pay | Admitting: Student

## 2023-09-26 NOTE — Telephone Encounter (Signed)
 Patient called stating they are scheduled for a lab appointment on 2/20 but also needs to have stitches removed. Would you like [place] to schedule her with Gerilyn Pilgrim or add on to her current lab appointment?

## 2023-09-28 ENCOUNTER — Encounter (HOSPITAL_COMMUNITY): Payer: 59

## 2023-09-29 ENCOUNTER — Telehealth: Payer: Self-pay | Admitting: Cardiology

## 2023-09-29 ENCOUNTER — Ambulatory Visit (HOSPITAL_BASED_OUTPATIENT_CLINIC_OR_DEPARTMENT_OTHER): Payer: 59 | Admitting: Student

## 2023-09-29 ENCOUNTER — Encounter (HOSPITAL_BASED_OUTPATIENT_CLINIC_OR_DEPARTMENT_OTHER): Payer: Self-pay | Admitting: Student

## 2023-09-29 VITALS — BP 127/81 | HR 96 | Temp 98.2°F | Ht 63.0 in | Wt 222.6 lb

## 2023-09-29 DIAGNOSIS — E876 Hypokalemia: Secondary | ICD-10-CM

## 2023-09-29 DIAGNOSIS — S0990XD Unspecified injury of head, subsequent encounter: Secondary | ICD-10-CM | POA: Diagnosis not present

## 2023-09-29 NOTE — Telephone Encounter (Signed)
 Patient wants a provider switch from Dr. Bing Matter in [place] to Dr. Odis Hollingshead in [place].  Please confirm.

## 2023-09-29 NOTE — Patient Instructions (Addendum)
 It was nice to see you today!  Magnesium glycinate 400mg  daily for magnesium levels  Please keep the stitch site clean. You may shower as normal but do not submerge your site under water for a few days while the site closes up.  If you have any problems before your next visit feel free to message me via MyChart (minor issues or questions) or call the office, otherwise you may reach out to schedule an office visit.  Thank you! Gerilyn Pilgrim Trica Usery, PA-C

## 2023-09-29 NOTE — Progress Notes (Signed)
   Acute Office Visit  Subjective:     Patient ID: Casey [name], female    DOB: 07-22-1971, 53 y.o.   MRN: [id number]  Chief Complaint  Patient presents with   Follow-up    Pt. Here for stiches removal and blood work.     HPI Stitches placed- She face-planted on Sunday while waiting in line for food. She does not remember exactly what happened. She was checked out at [facility]. Did a CT scan at [facility], was normal per patient. Discussed possible etiologies of syncope.  Patient is here to have sutures removed, she is aware of risks and benefits of this procedure and would like to continue.  Suture Removal  Date/Time: 09/29/2023 1:36 PM  Performed by: Dominic Pea, PA-C Authorized by: Dominic Pea, PA-C  Body area: head/neck Location details: left eyebrow Wound Appearance: clean Sutures Removed: 4 Post-procedure assessment: no dressing applied. Patient tolerance: patient tolerated the procedure well with no immediate complications Comments: Patient instructed to keep clean.      ROS  Per hpi    Objective:    BP 127/81 (BP Location: Right Arm, Patient Position: Sitting, Cuff Size: Normal)   Pulse 96   Temp 98.2 F (36.8 C) (Oral)   Ht 5\' 3"  (1.6 m)   Wt 222 lb 9.6 oz (101 kg)   SpO2 98%   BMI 39.43 kg/m    Physical Exam Constitutional:      Appearance: Normal appearance.  HENT:     Nose: Nose normal.  Eyes:     Conjunctiva/sclera: Conjunctivae normal.     Comments: Bruising around left eye  Skin:    General: Skin is warm and dry.     Coloration: Skin is not jaundiced or pale.     Findings: Bruising present.     Comments: Bruising around left eye  Neurological:     General: No focal deficit present.     Mental Status: She is alert.  Psychiatric:        Mood and Affect: Mood normal.        Behavior: Behavior normal.     No results found for any visits on 09/29/23.      Assessment & Plan:   Head injury- Stitch  removal -Procedure done. -4 stitches removed. - Patient tolerated well. - No other issues. -Patient given care instructions.   Return for as scheduled.  Casey Lucy Nneka Blanda, PA-C

## 2023-09-30 ENCOUNTER — Encounter (HOSPITAL_COMMUNITY): Payer: 59

## 2023-09-30 ENCOUNTER — Encounter (HOSPITAL_BASED_OUTPATIENT_CLINIC_OR_DEPARTMENT_OTHER): Payer: Self-pay | Admitting: Student

## 2023-09-30 LAB — POTASSIUM: Potassium: 4.2 mmol/L (ref 3.5–5.2)

## 2023-10-01 NOTE — Telephone Encounter (Signed)
 Sure.  Tessa Lerner, DO, [facility]

## 2023-10-03 ENCOUNTER — Encounter (HOSPITAL_COMMUNITY): Payer: 59

## 2023-10-03 NOTE — Telephone Encounter (Signed)
Patient is calling back for update. Please advise  

## 2023-10-05 ENCOUNTER — Encounter (HOSPITAL_COMMUNITY): Payer: 59

## 2023-10-06 ENCOUNTER — Other Ambulatory Visit: Payer: Self-pay | Admitting: Nephrology

## 2023-10-06 DIAGNOSIS — R8290 Unspecified abnormal findings in urine: Secondary | ICD-10-CM

## 2023-10-06 LAB — HM HEPATITIS C SCREENING LAB: HM Hepatitis Screen: NEGATIVE

## 2023-10-07 ENCOUNTER — Encounter (HOSPITAL_COMMUNITY): Payer: 59

## 2023-10-10 ENCOUNTER — Encounter (HOSPITAL_COMMUNITY): Payer: 59

## 2023-10-11 ENCOUNTER — Encounter (HOSPITAL_BASED_OUTPATIENT_CLINIC_OR_DEPARTMENT_OTHER): Payer: Self-pay | Admitting: Student

## 2023-10-11 ENCOUNTER — Ambulatory Visit (INDEPENDENT_AMBULATORY_CARE_PROVIDER_SITE_OTHER): Admitting: Student

## 2023-10-11 VITALS — BP 123/75 | HR 101 | Temp 97.9°F | Ht 63.0 in | Wt 225.8 lb

## 2023-10-11 DIAGNOSIS — S060X9A Concussion with loss of consciousness of unspecified duration, initial encounter: Secondary | ICD-10-CM | POA: Insufficient documentation

## 2023-10-11 DIAGNOSIS — S060X9D Concussion with loss of consciousness of unspecified duration, subsequent encounter: Secondary | ICD-10-CM | POA: Diagnosis not present

## 2023-10-11 DIAGNOSIS — R600 Localized edema: Secondary | ICD-10-CM

## 2023-10-11 DIAGNOSIS — R801 Persistent proteinuria, unspecified: Secondary | ICD-10-CM | POA: Diagnosis not present

## 2023-10-11 NOTE — Progress Notes (Signed)
 Acute Office Visit  Subjective:     Patient ID: Casey [name], female    DOB: May 04, 1971, 53 y.o.   MRN: [id number]  Chief Complaint  Patient presents with   Medical Management of Chronic Issues    Here for follow up.   Headache    Has been having headaches since her fall when she hit her forehead. Stays cold. Having pain under left eye. Stayed fatigued. Wobbly. [facility] never told her she had a concussion. Cannot recall the fall. Blurred vision. CT was done at [facility] but no MRI, they said it was clear.     HPI Patient presents today for an acute visit and would like to discuss follow-up at this time as well.  Headache- There was loss of consciousness. Feels that she has had memory issues since her fall. has had constant left sided headache sicne her fall. Goes from annoying pain to quite a bit of pressure. Notes that she has quite a bit of tenderness to palpation around her eye. States that she has had quite a bit of fatigue. Notes that she gets very tired when she stands for prolonged periods of time. Had some blurred vision a couple of days ago. Discussed gradual return to activity.  Standardized concussion worksheet was filled out accordingly and should be available in the media tab.  Due to symptomatology, loss of consciousness associated with the event, and negative head CT, her injury of her head most likely resulted in a concussion.  Proteinuria- Saw nephrologist a few days ago. 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 Hypokalemia resolved.  BLE edema- Utilizing compression stockings.  Patient has begun compression stockings only at her feet, encouraged her to get compression stockings that at least cover her calves.  ROS      Objective:    BP 123/75 (BP Location: Right Arm, Patient Position: Sitting, Cuff Size: Normal)   Pulse (!) 101   Temp 97.9 F (36.6 C) (Oral)   Ht 5\' 3"  (1.6 m)   Wt 225 lb 12.8 oz (102.4 kg)   SpO2 98%   BMI 40.00 kg/m     Physical Exam Constitutional:      Appearance: She is well-developed.  HENT:     Head: Normocephalic and atraumatic.  Eyes:     Extraocular Movements: Extraocular movements intact.     Right eye: Normal extraocular motion.     Left eye: Normal extraocular motion.     Pupils: Pupils are equal, round, and reactive to light.     Right eye: Pupil is round and reactive.     Left eye: Pupil is round and reactive.     Comments: Pain with EOM.  Cardiovascular:     Rate and Rhythm: Normal rate and regular rhythm.     Heart sounds: Normal heart sounds. No murmur heard.    No gallop.  Pulmonary:     Effort: Pulmonary effort is normal. No respiratory distress.     Breath sounds: Normal breath sounds. No wheezing, rhonchi or rales.  Musculoskeletal:        General: No swelling or tenderness.  Skin:    General: Skin is warm and dry.     Coloration: Skin is not cyanotic or pale.  Neurological:     Mental Status: She is alert.     Coordination: Romberg sign negative. Coordination normal.  Psychiatric:        Mood and Affect: Mood normal.  Behavior: Behavior normal.     No results found for any visits on 10/11/23.      Assessment & Plan:   Casey [name] was seen today for medical management of chronic issues and headache.  Concussion with loss of consciousness, subsequent encounter Assessment & Plan: Standardized concussion worksheet was filled out accordingly and should be available in the media tab.  Due to symptomatology, loss of consciousness associated with the event, and negative head CT, her injury of her head most likely resulted in a concussion   Persistent proteinuria Assessment & Plan: Continue to follow with nephrology. Extensive workup done by nephrology-awaiting results.   Bilateral lower extremity edema Assessment & Plan: Continue to utilize compression stockings. Encouraged patient to get compression stockings to cover her calves.    I have spent greater  than 30 minutes charting, educating, diagnosing and managing this patient for this visit.  Return in about 6 weeks (around 11/22/2023) for concussion follow up.Gerilyn Pilgrim T Brodie Correll, PA-C

## 2023-10-11 NOTE — Assessment & Plan Note (Signed)
 Standardized concussion worksheet was filled out accordingly and should be available in the media tab.  Due to symptomatology, loss of consciousness associated with the event, and negative head CT, her injury of her head most likely resulted in a concussion

## 2023-10-11 NOTE — Assessment & Plan Note (Signed)
 Continue to follow with nephrology. Extensive workup done by nephrology-awaiting results.

## 2023-10-11 NOTE — Patient Instructions (Signed)
 It was nice to see you today!  As we discussed in clinic I would like to see you in 6 weeks to make sure your symptoms have gone..  If you have any problems before your next visit feel free to message me via MyChart (minor issues or questions) or call the office, otherwise you may reach out to schedule an office visit.  Thank you! Gerilyn Pilgrim Allisyn Kunz, PA-C

## 2023-10-11 NOTE — Progress Notes (Addendum)
 Office Visit Note  Patient: Casey [name]             Date of Birth: 04-Nov-1970           MRN: [id number]             PCP: Dimple Nanas Referring: Olive Bass,* Visit Date: 10/24/2023 Occupation: @GUAROCC @  Subjective:  Pain in all the joints and muscles  History of Present Illness: Casey [name] is a 53 y.o. female seen in consultation per request of her PCP.  She had dislocation of right patella at age 87 and later developed osteoarthritis.She was diagnosed with sero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 arthritis in 2013.  She had right total knee replacement in 2018.  She had viscosupplement injections to the left knee joint.  She was under care of [facility] rheumatology for a while and was discharged from their practice while she was on infliximab 1000 mg every 4 weeks and last was given on September 11, 2021.  Patient was evaluated by Dr. Gaye Pollack at [facility] and then  at [facility], at [facility] and by Dr. Tod Persia in the past.  She was also given the diagnosis of inflammatory bowel disease associated with arthritis.  She had several colonoscopies in the past.  Patient states that she cannot have colonoscopy anymore due to her medical condition.  Patient was under care of [facility] rheumatology fand then under care of [facility] rheumatology.  Office visit note from Ms. Cheema PA on March 01, 2022 RF negative, anti-CCP negative.  Feet x-rays did not show any erosions.  Patient was getting infliximab infusions 10 mg/kg every 4 to 8 weeks due to episodes of Candida esophagitis. Telemedicine visit June 06, 2023.  Patient states she has been on simply Aria infusions since April 2024 every 8 weeks.  Her last infusion was in December 2024.  She decided not to go to [facility] anymore due to commute.  Previous treatment hydroxychloroquine till September 2013 methotrexate from August 2013 till December 2013-dyspepsia Methotrexate  Sulfasalazine March 2014-hives Imuran March 2014-May  2014-discontinued due to nausea Humira August 2014-November 2014-GI side effects and rash Cimzia October 2015-stopped eventually due to lack of efficacy Infliximab January 2018 initially 600 mg then increased to 1000 mg every 4 weeks-04/24-recurrent esophageal candidiasis Simponi Aria 04/24 q 8 weeks- last infusion 12/ 2024  HLA-B27 negative RF negative anti-CCP negative ANA negative, dsDNA negative C3-C4 normal  Treated by [facility].   history of recurrent Candida esophagitis.  According the patient she has been seen at [facility] now.  Activities of Daily Living:  Patient reports morning stiffness for 10-15 minutes.   Patient Reports nocturnal pain.  Difficulty dressing/grooming: Reports Difficulty climbing stairs: Reports Difficulty getting out of chair: Reports Difficulty using hands for taps, buttons, cutlery, and/or writing: Reports  Review of Systems  Constitutional:  Positive for fatigue.  HENT:  Positive for mouth dryness. Negative for mouth sores.   Eyes:  Positive for dryness.  Respiratory:  Positive for shortness of breath.   Cardiovascular:  Positive for chest pain and palpitations.  Gastrointestinal:  Positive for constipation. Negative for blood in stool and diarrhea.  Endocrine: Negative for increased urination.  Genitourinary:  Positive for involuntary urination.  Musculoskeletal:  Positive for joint pain, gait problem, joint pain, joint swelling, myalgias, muscle weakness, morning stiffness, muscle tenderness and myalgias.  Skin:  Positive for sensitivity to sunlight. Negative for color change, rash and hair loss.  Allergic/Immunologic: Positive for susceptible to infections.  Neurological:  Positive for dizziness and headaches.  Hematological:  Negative for swollen glands.  Psychiatric/Behavioral:  Positive for sleep disturbance. Negative for depressed mood. The patient is not nervous/anxious.     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   Coronary artery disease s/p PCI/DES to LAD 07/01/2023 12/28/2022   Precordial chest pain 10/13/2022   Hyperlipidemia 10/07/2022   Hiatal hernia 10/07/2022   Esophageal candidiasis (HCC) 06/26/2022   Urinary tract infection, site not specified 06/26/2022   Ureteral stone 01/20/2022   Encounter for monitoring immunomodulating therapy 11/20/2021   Rheumatoid arthritis of multiple sites with 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 factor (HCC) 11/16/2021   Pyelonephritis 09/25/2021   De Quervain's tenosynovitis, left 07/31/2021   Contusion of left wrist 07/08/2021   Status post placement of ureteral stent 02/06/2021   Proteinuria 08/12/2020   Environmental allergies 08/12/2020   History of kidney stones 08/12/2020   Tooth infection 08/12/2020   Gastroesophageal reflux disease with esophagitis without hemorrhage 08/01/2019   Slow transit constipation 08/01/2019   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   History of colon polyps 06/19/2019   Irregular astigmatism of both eyes 10/11/2018   Nephrolithiasis 09/08/2018   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   Plantar wart 11/04/2016   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   Myopia with astigmatism and presbyopia 05/07/2016   Overactive bladder 11/18/2015   Incontinence 10/07/2015   Albuminuria 06/27/2015   Essential hypertension 06/27/2015   Microhematuria 06/27/2015   Hematuria 06/18/2015   Cataract 05/06/2015   Dry eyes 05/06/2015   Crohn's disease (HCC) 04/21/2015    Osteoarthritis of both knees 04/21/2015   Right knee pain 04/21/2015   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/30/2014   History of abnormal cervical Pap smear 11/29/2014   Fibrositis 08/28/2014   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   Migraine without aura and without status migrainosus, not intractable 08/28/2014   Migraine 08/28/2014   Seronegative inflammatory arthritis 08/28/2014   Increased urinary frequency 07/16/2013   Dysfunction of both eustachian tubes 08/31/2012   Asthma 10/30/2008   Mild intermittent asthma without complication 10/30/2008    Past Medical History:  Diagnosis Date   Albuminuria 06/27/2015   Anxiety 11/04/2014   Arm DVT (deep venous thromboembolism), acute, left (HCC) 08/17/2022   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/30/2014   Asthma    Atopic dermatitis 10/30/2008   Formatting of this note might be different from the original. Atopic dermatitis Formatting of this note might be different from the original. Formatting of this note might be different from the original. Atopic dermatitis Formatting of this note might be different from the original. Formatting of this note might be different from the original. Formatting of this note might be different from the or   Cataract 05/06/2015   Contusion of left wrist 07/08/2021   Coronary artery disease s/p PCI/DES to LAD 07/01/2023 12/28/2022   Crohn's disease (HCC) 04/21/2015   De Quervain's tenosynovitis, left 07/31/2021   Diabetes mellitus without complication (HCC)    Diabetes type 2, controlled (HCC) 04/21/2015   Dry eyes 05/06/2015  DVT (deep venous thrombosis) (HCC)    Dysfunction of both eustachian tubes 08/31/2012   Encounter for monitoring immunomodulating therapy 11/20/2021   Environmental allergies 08/12/2020   Esophageal candidiasis (HCC) 06/26/2022   Essential hypertension 06/27/2015   Fibrositis 08/28/2014   Formatting of this note might be different from the original.  Fibromyalgia Formatting of this note might be different from the original. Formatting of this note might be different from the original. Fibromyalgia   Flank pain 11/24/2020   Gastroesophageal reflux disease with esophagitis without hemorrhage 08/01/2019   Formatting of this note might be different from the original. Formatting of this note might be different from the original. 07/2019: chronic symptoms of esophageal reflux since prior to sleeve gastrectomy surgery. Symptoms have worsened over the last 6 months with recent EGD findings of grade B esophagitis, irregular z line, erythematous mucosa, and evidence of sleeve gastrectomy. 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 Formatting of this note might be different from the original. 07/2019: 8 mm colonic polyp removed during colonoscopy, biopsy showed serrated polyp. Repeat colonoscopy recommended in one year. Formatting of this note might be different from the original. Added automatically from request for surgery [phone] Formatting of this note might b   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   History of kidney stones 08/12/2020   Hypercholesterolemia 10/07/2022   Hypertension, essential, benign 06/27/2015   Hypertensive disorder 08/28/2014   Formatting of this note might be different from the original. Hypertension Formatting of this note might be different from the original. Formatting of this note might be different from the original. 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 Keratoconus Formatting of this note might be  different from the original. Formatting of this note might be different from the original. Keratoconus Formatting of this note might be different from the original. Formatting of this note might be different from the original. Keratoconus Formatting of this note might be different from the or   Microhematuria 06/27/2015   Migraine 08/28/2014   Formatting of this note might be different from the original. Migraine Formatting of this note might be different from the original. Formatting of this note might be different from the original. Migraine Formatting of this note might be different from the original. Migraine Formatting of this note might be different from the original. Migraine Formatting of this note might be different from the or   Migraine without aura and without status migrainosus, not intractable 08/28/2014   Formatting of this note might be different from the original. Formatting of this note might be different from the original. Formatting of this note might be different from the original. Migraine Formatting of this note might be different from the original. Migraine Formatting of this note might be different from the original. Formatting of this note might be different from the original. Migraine F   Mild intermittent asthma without complication 10/30/2008   Formatting of this note might be different from the original. Formatting of this note might be different from the original. Formatting of this note might be different from the original. Asthma Formatting  of this note might be different from the original. Asthma Formatting of this note might be different from the original. Formatting of this note might be different from the original. 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 Formatting of this note might be different from the original. 01/25/2016 with Dr. Dionisio David at [facility]. [place] of this note might be different from the original. 01/25/2016 with Dr. Dionisio David at [facility]. [place] of this note might be different from the original. Formatting of this note might be different from    Seronegative inflammatory arthritis 08/28/2014   Formatting of this note might be different from the original. Formatting of this note might be different from the original. Arthritis; diagnosed as IBD related - 2012 Formatting of this note might be different from the original. Followed by rheumatologist. Had to change providers due to an insurance change earlier this year resulting in patient not being able to have Remicade for ~6 months. 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 Formatting of this note might be different from the original. Treated with PRN miralax. Recent colonoscopy 07/2019. Formatting of this note might be different from the original. Treated with PRN miralax. Recent colonoscopy 07/2019. Formatting of this note might be different from the original. Formatting of this note might be different f   Status post placement of ureteral stent 02/06/2021   Tooth infection 08/12/2020   Unstable angina (HCC) 07/29/2023   Ureteral stone 01/20/2022   Urinary tract infection, site not specified  06/26/2022    Family History  Adopted: Yes  Problem Relation Age of Onset   Diabetes Mother    Hypertension Mother    Diabetes Father    Hypertension Father    Hypertension Sister    Diabetes Sister    Hypertension Brother    Diabetes Brother    Diabetes Paternal Grandmother    Cancer Other    Asthma Neg Hx    Allergic rhinitis Neg Hx    Atopy Neg Hx    Eczema Neg Hx    Past Surgical History:  Procedure Laterality Date   BACK SURGERY     L5-S1   CERVICAL CONE BIOPSY  04/2000   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   CORONARY STENT INTERVENTION N/A 07/01/2023   Procedure: CORONARY STENT INTERVENTION;  Surgeon: Iran Ouch, MD;  Location: [facility]AS[facility];  Service: Cardiovascular;  Laterality: N/A;   EYE SURGERY     GASTRIC BYPASS  2017   Sleve   LAPAROSCOPIC GASTRIC SLEEVE RESECTION     LEFT HEART CATH AND CORONARY ANGIOGRAPHY N/A 07/01/2023   Procedure: LEFT HEART CATH AND CORONARY ANGIOGRAPHY;  Surgeon: Iran Ouch, MD;  Location: [facility];  Service: Cardiovascular;  Laterality: N/A;   LEFT HEART  CATH AND CORONARY ANGIOGRAPHY N/A 08/01/2023   Procedure: LEFT HEART CATH AND CORONARY ANGIOGRAPHY;  Surgeon: Yates Decamp, MD;  Location: [facility];  Service: Cardiovascular;  Laterality: N/A;   Low back disc surgery     06/2000   TOTAL KNEE ARTHROPLASTY Right 11/2016   Social History   Social History Narrative   2 dogs    Immunization History  Administered Date(s) Administered   Hep A, Unspecified 09/09/2011, 10/15/2011   Hepatitis B, ADULT 09/09/2011, 10/15/2011   Influenza, Quadrivalent, Recombinant, Inj, Pf 05/27/2020   Influenza,inj,Quad PF,6+ Mos 04/19/2022, 06/13/2023   Influenza-Unspecified 05/03/2012   Moderna Covid-19 Fall Seasonal Vaccine 63yrs & older 06/07/2022   Pfizer Covid-19 Vaccine Bivalent Booster 53yrs & up 07/06/2021     Objective: Vital Signs: BP 108/73 (BP Location: Right Arm, Patient Position: Sitting, Cuff Size:  Large)   Pulse 86   Resp 14   Ht 5' 3.5" (1.613 m)   Wt 221 lb (100.2 kg)   BMI 38.53 kg/m    Physical Exam Vitals and nursing note reviewed.  Constitutional:      Appearance: She is well-developed.  HENT:     Head: Normocephalic and atraumatic.  Eyes:     Conjunctiva/sclera: Conjunctivae normal.  Cardiovascular:     Rate and Rhythm: Normal rate and regular rhythm.     Heart sounds: Normal heart sounds.  Pulmonary:     Effort: Pulmonary effort is normal.     Breath sounds: Normal breath sounds.  Abdominal:     General: Bowel sounds are normal.     Palpations: Abdomen is soft.  Musculoskeletal:     Cervical back: Normal range of motion.  Lymphadenopathy:     Cervical: No cervical adenopathy.  Skin:    General: Skin is warm and dry.     Capillary Refill: Capillary refill takes less than 2 seconds.  Neurological:     Mental Status: She is alert and oriented to person, place, and time.  Psychiatric:        Behavior: Behavior normal.      Musculoskeletal Exam: Cervical spine was in good range of motion.  Shoulder joints, elbow joints, wrist joints, MCPs PIPs and DIPs Juengel range of motion with no synovitis.  Hip joints were in good range of motion without any discomfort.  Knee joints with good range of motion without any warmth swelling or effusion.  There was no synovitis over ankles or MTPs.  CDAI Exam: CDAI Score: -- Patient Global: --; Provider Global: -- Swollen: --; Tender: -- Joint Exam 10/24/2023   No joint exam has been documented for this visit   There is currently no information documented on the homunculus. Go to the Rheumatology activity and complete the homunculus joint exam.  Investigation: No additional findings.  Imaging: XR Foot 2 Views Left Result Date: 10/24/2023 PIP and DIP narrowing was noted.  No MTP, intertarsal, tibiotalar or subtalar joint space narrowing was noted.  No erosive changes were noted.  Posterior calcaneal spur was noted.  Impression: These findings suggestive of osteoarthritis of the foot.  XR Foot 2 Views Right Result Date: 10/24/2023 PIP and DIP narrowing was noted.  No MTP, intertarsal, tibiotalar or subtalar joint space narrowing was noted.  No erosive changes were noted.  Small posterior calcaneal spur was noted. Impression: These findings are suggestive of osteoarthritis of the foot.  XR Hand 2 View Left Result Date: 10/24/2023 CMC, PIP and DIP narrowing was noted.  No MCP, intercarpal or radiocarpal joint  space narrowing was noted.  No erosive changes were noted. Impression: These findings with history of osteoarthritis of the hand.  XR Hand 2 View Right Result Date: 10/24/2023 CMC, PIP and DIP narrowing was noted.  No MCP, intercarpal or radiocarpal joint space narrowing was noted.  No erosive changes were noted. Impression: These findings with history of osteoarthritis of the hand.  DG Chest 2 View Result Date: 10/16/2023 CLINICAL DATA:  Chest pain EXAM: CHEST - 2 VIEW COMPARISON:  X-ray 10/13/2023 and older FINDINGS: Underinflation. Minimal linear opacity left lung base likely scar or atelectasis. Normal cardiopericardial silhouette without edema when adjusting for level of inflation. No consolidation, pneumothorax or effusion. Films are under penetrated. IMPRESSION: Underinflation.  Slight left basilar scar or atelectasis. Electronically Signed   By: Karen Kays M.D.   On: 10/16/2023 17:22   DG Chest 2 View Result Date: 10/13/2023 CLINICAL DATA:  Cough and fever. EXAM: CHEST - 2 VIEW COMPARISON:  Two-view chest x-ray 10/12/2023. FINDINGS: The heart size is upper limits of normal, exaggerated by low lung volumes. Mild pulmonary vascular congestion is present without frank edema or effusions. No focal airspace disease is present. The visualized soft tissues and bony thorax are unremarkable. IMPRESSION: Low lung volumes and mild pulmonary vascular congestion. Electronically Signed   By: Marin Roberts M.D.    On: 10/13/2023 11:46   DG Chest 2 View Result Date: 10/12/2023 CLINICAL DATA:  Productive cough.  Vomiting. EXAM: CHEST - 2 VIEW COMPARISON:  09/10/2023 FINDINGS: The heart size and mediastinal contours are within normal limits. Both lungs are clear. The visualized skeletal structures are unremarkable. IMPRESSION: No active cardiopulmonary disease. Electronically Signed   By: Danae Orleans M.D.   On: 10/12/2023 21:16    Recent Labs: Lab Results  Component Value Date   WBC 6.7 10/16/2023   HGB 11.8 (L) 10/16/2023   PLT 313 10/16/2023   NA 137 10/16/2023   K 3.3 (L) 10/16/2023   CL 104 10/16/2023   CO2 25 10/16/2023   GLUCOSE 102 (H) 10/16/2023   BUN 5 (L) 10/16/2023   CREATININE 0.81 10/16/2023   BILITOT 0.9 10/13/2023   ALKPHOS 84 10/13/2023   AST 28 10/13/2023   ALT 18 10/13/2023   PROT 7.2 10/13/2023   ALBUMIN 3.4 (L) 10/13/2023   CALCIUM 8.9 10/16/2023   October 06, 2023 hepatitis B nonreactive, Hepatitis C nonreactive, HIV nonreactive, ANCA negative, C3 normal, C4 normal, ANA 1: 320  August 22, 2023 UA 1+ protein   Speciality Comments: Previous treatment-hydroxychloroquine, methotrexate, sulfasalazine, Imuran, Humira, Cimzia, IV Remicade 10 mg/kg every 4 weeks, Simponi Aria infusions 04/24-12/24.  Procedures:  No procedures performed Allergies: Albuterol sulfate, Tape, Formoterol, Hydrocodone, Hydrocodone-acetaminophen, Nickel, Other, Oxycodone-acetaminophen, Salmeterol, Strawberry extract, Advair hfa [fluticasone-salmeterol], Proventil hfa [albuterol], Asa [aspirin], Cleocin [clindamycin], Clindamycin/lincomycin, Fludrocortisone acetate, Gabapentin, Hydrochlorothiazide, Imipramine, Ipratropium bromide, Medroxyprogesterone, Meloxicam, Metformin and related, Nystatin, Penicillins, Pred forte [prednisolone], Prednisone, Solu-medrol [methylprednisolone], Sulfa antibiotics, Tegaderm ag mesh [silver], Toradol [ketorolac tromethamine], and Tramadol   Assessment / Plan:      Visit Diagnoses: Rheumatoid arthritis of multiple sites with 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 factor (HCC)-patient states that she was diagnosed with the rheumatoid arthritis in [place] by her rheumatologist when she developed a rash on her hands.  She said initially she was diagnosed with IBD related arthritis with possible diagnosis of Crohn's disease.  Later she was diagnosed with sero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 arthritis.  She was tried on methotrexate, sulfasalazine, Imuran, Humira, Cimzia, infliximab and later Simponi Aria infusions.  She had been  care of [facility] rheumatology, [facility] and then [facility]ology.  Patient states she stopped going to [facility] rheumatology due to commute.  Her last medication was Simponi Aria infusion which was started in April 2024 and the last infusion was in December 2024.  She states that her joints never swell.  She states that she feels pain in all of her joints and muscles.  She states she had side effects from all previous medications or intolerance or inadequate response.  While she was on Remicade she felt the best but she had frequent Candida infections.  I had a detailed discussion with the patient.  I explained to her that I do not see synovitis on the examination.  I will obtain x-rays and labs today.  I do not think an immunosuppressive agent will be effective in managing pain if she does not have active autoimmune disease.  It is possible that if she had rheumatoid arthritis it may be in remission as I did not see any synovitis or synovial thickening on the examination.  High risk medication use -Previous treatment-hydroxychloroquine, methotrexate-dyspepsia, sulfasalazine-hives, Imuran-nausea, Humira-rash, Cimzia-inadequate response, IV Remicade 10 mg/kg every 4 weeks-recurrent esophageal candidiasis, Simponi Aria infusions 04/24-12/24.October 06, 2023 hepatitis B nonreactive, hepatitis C nonreactive, HIV nonreactive.  Plan: QuantiFERON-TB Gold Plus, Serum protein  electrophoresis with reflex, IgG, IgA, IgM  De Quervain's tenosynovitis, left - status post surgery.  Currently not symptomatic.  Status post total right knee replacement - 04/18 in [place].  Doing well.  Pain in both hands -she complains of pain in her bilateral hands.  No synovitis was noted on the examination.  Plan: XR Hand 2 View Right, XR Hand 2 View Left, Rheumatoid factor, Cyclic citrul peptide antibody, IgG, Sedimentation rate  Primary osteoarthritis of left knee -patient states that she has severe osteoarthritis in her left knee joint.  No warmth swelling or effusion was noted.  According to the patient she could not have left total knee replacement due to medical condition.  Pain in both feet -she complains of pain and discomfort in the bilateral feet.  No synovitis was noted.  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  She continues to have some lower back pain.  Positive ANA (antinuclear antibody) -she had workup by the nephrologist due to proteinuria.  Her ANA was positive.  I will obtain additional labs today.  Plan: Anti-scleroderma antibody, ANA, RNP Antibody, Sjogrens syndrome-A extractable nuclear antibody, Anti-Smith antibody, Sjogrens syndrome-B extractable nuclear antibody, Anti-DNA antibody, double-stranded, C3 and C4, Beta-2 glycoprotein antibodies, Cardiolipin antibodies, IgG, IgM, IgA  Coronary artery disease of native artery of native heart with stable angina pectoris (HCC) - s/p stent placement 2024  Crohn's disease of both small and large intestine without complication (HCC) - uncertain diagnosis per patient.  Patient states she is followed by [facility].  Patient states that the Crohn's disease diagnosis could not be confirmed as she could not have repeat colonoscopy.  Hepatic steatosis  Gastroesophageal reflux disease with esophagitis  without hemorrhage  H/O gastric sleeve - 2017  Esophageal dysmotility - History of esophageal dilation  History of DVT (deep vein thrombosis) - 2023 left arm- after IV. Eliquis x3 months.  Essential hypertension  Other proteinuria-patient states that she was evaluated by nephrology for proteinuria.  And there was a question about lupus nephritis.  I reviewed Dr.Kolluru office visit note.  Per note patient was seen by her nephrologist and [facility] [place] and was diagnosed with microscopic hematuria and proteinuria several years ago.  There is no mention of lupus nephritis.  According to the note hematuria could be due to nephrolithiasis and proteinuria due to diabetes.  Fibromyalgia -patient states that she was initially diagnosed with fibromyalgia and was tried on multiple medication.  In the past she was treated with gabapentin, duloxetine, and amitriptyline. Gabapentin was associated with nausea, and amitriptyline was associated with suicidal ideations  Other fatigue-related to fibromyalgia  Other insomnia  Environmental allergies  History of kidney stones  Moderate persistent asthma without complication  Migraine without aura and without status migrainosus, not intractable  Orders: Orders Placed This Encounter  Procedures   XR Hand 2 View Right   XR Hand 2 View Left   XR Foot 2 Views Right   XR Foot 2 Views Left   Rheumatoid factor   Cyclic citrul peptide antibody, IgG   Sedimentation rate   Anti-scleroderma antibody   ANA   RNP Antibody   Sjogrens syndrome-A extractable nuclear antibody   Anti-Smith antibody   Sjogrens syndrome-B extractable nuclear antibody   Anti-DNA antibody, double-stranded   C3 and C4   Beta-2 glycoprotein antibodies   Cardiolipin antibodies, IgG, IgM, IgA   QuantiFERON-TB Gold Plus   Serum protein electrophoresis with reflex   IgG, IgA, IgM   No orders of the defined types were placed in this encounter.   Face-to-face time spent with  patient was over 60 minutes. Greater than 50% of time was spent in counseling and coordination of care.  Total time spent in reviewing previous records, precharting was over 89 minutes.  Follow-Up Instructions: Return for Rheumatoid arthritis.   Pollyann Savoy, MD  Note - This record has been created using Animal nutritionist.  Chart creation errors have been sought, but may not always  have been located. Such creation errors do not reflect on  the standard of medical care.

## 2023-10-11 NOTE — Assessment & Plan Note (Signed)
 Continue to utilize compression stockings. Encouraged patient to get compression stockings to cover her calves.

## 2023-10-12 ENCOUNTER — Ambulatory Visit (HOSPITAL_BASED_OUTPATIENT_CLINIC_OR_DEPARTMENT_OTHER)
Admit: 2023-10-12 | Discharge: 2023-10-12 | Disposition: A | Discharge status: Home / Self Care | Attending: Family Medicine | Admitting: Radiology

## 2023-10-12 ENCOUNTER — Encounter (HOSPITAL_BASED_OUTPATIENT_CLINIC_OR_DEPARTMENT_OTHER): Payer: Self-pay

## 2023-10-12 ENCOUNTER — Ambulatory Visit (HOSPITAL_BASED_OUTPATIENT_CLINIC_OR_DEPARTMENT_OTHER): Payer: Self-pay | Admitting: Student

## 2023-10-12 ENCOUNTER — Ambulatory Visit (HOSPITAL_BASED_OUTPATIENT_CLINIC_OR_DEPARTMENT_OTHER)
Admission: RE | Admit: 2023-10-12 | Discharge: 2023-10-12 | Disposition: A | Discharge status: Home / Self Care | Source: Ambulatory Visit | Attending: Family Medicine | Admitting: Family Medicine

## 2023-10-12 ENCOUNTER — Encounter (HOSPITAL_BASED_OUTPATIENT_CLINIC_OR_DEPARTMENT_OTHER): Payer: Self-pay | Admitting: Student

## 2023-10-12 ENCOUNTER — Encounter (HOSPITAL_COMMUNITY): Payer: 59

## 2023-10-12 ENCOUNTER — Other Ambulatory Visit (HOSPITAL_BASED_OUTPATIENT_CLINIC_OR_DEPARTMENT_OTHER): Payer: Self-pay | Admitting: Student

## 2023-10-12 VITALS — BP 114/77 | HR 114 | Temp 98.6°F | Resp 26

## 2023-10-12 DIAGNOSIS — R051 Acute cough: Secondary | ICD-10-CM

## 2023-10-12 DIAGNOSIS — R059 Cough, unspecified: Secondary | ICD-10-CM | POA: Diagnosis not present

## 2023-10-12 DIAGNOSIS — R1110 Vomiting, unspecified: Secondary | ICD-10-CM | POA: Diagnosis not present

## 2023-10-12 MED ORDER — TRIAMCINOLONE ACETONIDE 40 MG/ML IJ SUSP
40.0000 mg | Freq: Once | INTRAMUSCULAR | Status: AC
  Administered 2023-10-12: 40 mg via INTRAMUSCULAR

## 2023-10-12 MED ORDER — BENZONATATE 100 MG PO CAPS: 100.0000 mg | ORAL_CAPSULE | Freq: Two times a day (BID) | ORAL | 0 refills | Status: DC | PRN

## 2023-10-12 NOTE — Discharge Instructions (Signed)
 I am not seeing anything on x-ray and lungs were clear on exam.  Recommend doing some Mucinex to help with congestion. Viral test was inconclusive.  You can use albuterol as needed.  Also recommend antihistamine. Recommend follow-up with your primary care for any continued or worsening problems

## 2023-10-12 NOTE — ED Notes (Signed)
 Covid/Flu combo test was invald with red streaking after time completed.   This RN made patient aware of test and the need to recollect specimen for covid/flu test. Pt reports that she is not feeling very good at this time and prefers not to be swabbed again.  This RN made Traci, NP aware of test and patient not wanting to be re-tested at this time.

## 2023-10-12 NOTE — ED Provider Notes (Signed)
 [name]    CSN: [id number] Arrival date & time: 10/12/23  1731      History   Chief Complaint Chief Complaint  Patient presents with   Cough    HPI BROOKIE WAYMENT is a 53 y.o. female.   53 year old female presents today for cough.  This started as a dry cough this morning and then progressed into a wet copious cough this afternoon.  Patient coughing up copious amount of white sputum.  Reports that the sputum is gagging her and making her vomit. Some associated fevers and chills.   Cough Associated symptoms: shortness of breath     Past Medical History:  Diagnosis Date   Albuminuria 06/27/2015   Anxiety 11/04/2014   Arm DVT (deep venous thromboembolism), acute, left (HCC) 08/17/2022   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/30/2014   Asthma    Atopic dermatitis 10/30/2008   Formatting of this note might be different from the original. Atopic dermatitis Formatting of this note might be different from the original. Formatting of this note might be different from the original. Atopic dermatitis Formatting of this note might be different from the original. Formatting of this note might be different from the original. Formatting of this note might be different from the or   Cataract 05/06/2015   Contusion of left wrist 07/08/2021   Coronary artery disease s/p PCI/DES to LAD 07/01/2023 12/28/2022   Crohn's disease (HCC) 04/21/2015   De Quervain's tenosynovitis, left 07/31/2021   Diabetes mellitus without complication (HCC)    Diabetes type 2, controlled (HCC) 04/21/2015   Dry eyes 05/06/2015   DVT (deep venous thrombosis) (HCC)    Dysfunction of both eustachian tubes 08/31/2012   Encounter for monitoring immunomodulating therapy 11/20/2021   Environmental allergies 08/12/2020   Esophageal candidiasis (HCC) 06/26/2022   Essential hypertension 06/27/2015   Fibrositis 08/28/2014   Formatting of this note might be different from the original.  Fibromyalgia Formatting of this note might be different from the original. Formatting of this note might be different from the original. Fibromyalgia   Flank pain 11/24/2020   Gastroesophageal reflux disease with esophagitis without hemorrhage 08/01/2019   Formatting of this note might be different from the original. Formatting of this note might be different from the original. 07/2019: chronic symptoms of esophageal reflux since prior to sleeve gastrectomy surgery. Symptoms have worsened over the last 6 months with recent EGD findings of grade B esophagitis, irregular z line, erythematous mucosa, and evidence of sleeve gastrectomy. 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 Formatting of this note might be different from the original. 07/2019: 8 mm colonic polyp removed during colonoscopy, biopsy showed serrated polyp. Repeat colonoscopy recommended in one year. Formatting of this note might be different from the original. 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 Hypertension Formatting of this note might be different from the original. Formatting of this note might be different from the original. 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 Keratoconus Formatting of this note might be  different from the original. Formatting of this note might be different from the original. Keratoconus Formatting of this note might be different from the original. Formatting of this note might be different from the original. Keratoconus Formatting of this note might be different from the or   Microhematuria 06/27/2015   Migraine 08/28/2014   Formatting of this note might be different from the original. Migraine Formatting of this note might be different from the original. Formatting of this note might be different from the original. Migraine Formatting of this note might be different from the original. Migraine Formatting of this note might be different from the original. Migraine Formatting of this note might be different from the or   Migraine without aura and without status migrainosus, not intractable 08/28/2014   Formatting of this note might be different from the original. Formatting of this note might be different from the original. Formatting of this note might be different from the original. Migraine Formatting of this note might be different from the original. Migraine Formatting of this note might be different from the original. Formatting of this note might be different from the original. Migraine F   Mild intermittent asthma without complication 10/30/2008   Formatting of this note might be different from the original. Formatting of this note might be different from the original. Formatting of this note might be different from the original. Asthma Formatting of this note might be different from the original. Asthma Formatting of this note might be different from the original. Formatting of this note might be different from the original. 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 Formatting of this note might be different from the original. 01/25/2016 with Dr. Dionisio David at [facility]. [place] of this note might be different from the original. 01/25/2016 with Dr. Dionisio David at [facility]. [place] of this note might be different from the original. Formatting of this note might be different from    Seronegative inflammatory arthritis 08/28/2014   Formatting of this note might be different from the original. Formatting of this note might be different from the original. Arthritis; diagnosed as IBD related - 2012 Formatting of this note might be different from the original. Followed by rheumatologist. Had to change providers due to an insurance change earlier this year resulting in patient not being able to have Remicade for ~6 months. 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 Formatting of this note might be different from the original. Treated with PRN miralax. Recent colonoscopy 07/2019. Formatting of this note might be different from the original.  Treated with PRN miralax. Recent colonoscopy 07/2019. Formatting of this note might be different from the original. Formatting of this note might be different f   Status post placement of ureteral stent 02/06/2021   Tooth infection 08/12/2020   Unstable angina (HCC) 07/29/2023   Ureteral stone 01/20/2022   Urinary tract infection, site not specified  06/26/2022    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   Coronary artery disease s/p PCI/DES to LAD 07/01/2023 12/28/2022   Precordial chest pain 10/13/2022   Hyperlipidemia 10/07/2022   Hiatal hernia 10/07/2022   Esophageal candidiasis (HCC) 06/26/2022   Urinary tract infection, site not specified 06/26/2022   Ureteral stone 01/20/2022   Encounter for monitoring immunomodulating therapy 11/20/2021   Rheumatoid arthritis of multiple sites with 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 factor (HCC) 11/16/2021   Pyelonephritis 09/25/2021   De Quervain's tenosynovitis, left 07/31/2021   Contusion of left wrist 07/08/2021   Status post placement of ureteral stent 02/06/2021   Proteinuria 08/12/2020   Environmental allergies 08/12/2020   History of kidney stones 08/12/2020   Tooth infection 08/12/2020   Gastroesophageal reflux disease with esophagitis without hemorrhage 08/01/2019   Slow transit constipation 08/01/2019   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   History of colon polyps 06/19/2019   Irregular astigmatism of both eyes 10/11/2018   Nephrolithiasis 09/08/2018   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   Plantar wart 11/04/2016   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   Myopia with astigmatism and presbyopia 05/07/2016   Overactive bladder 11/18/2015   Incontinence 10/07/2015   Albuminuria 06/27/2015   Essential hypertension 06/27/2015   Microhematuria 06/27/2015   Hematuria 06/18/2015   Cataract 05/06/2015    Dry eyes 05/06/2015   Crohn's disease (HCC) 04/21/2015   Osteoarthritis of both knees 04/21/2015   Right knee pain 04/21/2015   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/30/2014   History of abnormal cervical Pap smear 11/29/2014   Fibrositis 08/28/2014   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   Migraine without aura and without status migrainosus, not intractable 08/28/2014   Migraine 08/28/2014   Seronegative inflammatory arthritis 08/28/2014   Increased urinary frequency 07/16/2013   Dysfunction of both eustachian tubes 08/31/2012   Asthma 10/30/2008   Mild intermittent asthma without complication 10/30/2008    Past Surgical History:  Procedure Laterality Date   BACK SURGERY     L5-S1   CERVICAL CONE BIOPSY  04/2000   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   CORONARY STENT INTERVENTION N/A 07/01/2023   Procedure: CORONARY STENT INTERVENTION;  Surgeon: Iran Ouch, MD;  Location: [facility];  Service: Cardiovascular;  Laterality: N/A;   EYE SURGERY     GASTRIC BYPASS  2017   Sleve   LAPAROSCOPIC GASTRIC SLEEVE RESECTION     LEFT HEART CATH AND CORONARY ANGIOGRAPHY N/A 07/01/2023   Procedure: LEFT HEART CATH AND CORONARY ANGIOGRAPHY;  Surgeon: Iran Ouch, MD;  Location: [facility];  Service: Cardiovascular;  Laterality: N/A;   LEFT HEART CATH AND CORONARY ANGIOGRAPHY N/A 08/01/2023   Procedure: LEFT HEART CATH AND CORONARY ANGIOGRAPHY;  Surgeon: Yates Decamp, MD;  Location: [facility];  Service: Cardiovascular;  Laterality: N/A;   Low back disc surgery     06/2000   TOTAL KNEE ARTHROPLASTY Right 11/2016    OB History     Gravida  0   Para  0   Term  0   Preterm  0   AB  0   Living  0      SAB  0   IAB  0   Ectopic  0   Multiple  0   Live Births  0            Home Medications    Prior to Admission medications   Medication Sig Start Date End Date Taking? Authorizing Provider  acetaminophen (TYLENOL) 500 MG tablet  Take 1,000 mg by mouth every 12 (twelve) hours as needed for mild pain or moderate pain.    [provider]  albuterol (PROVENTIL) (2.5 MG/3ML) 0.083% nebulizer solution Take 3 mLs (2.5 mg total) by nebulization every 6 (six) hours as needed for wheezing or shortness of breath. 11/30/22   Olive Bass, FNP  amLODipine (NORVASC) 10 MG tablet Take 1 tablet (10 mg total) by mouth daily. 08/08/23 11/06/23  Georgeanna Lea, MD  atorvastatin (LIPITOR) 80 MG tablet Take 1 tablet (80 mg total) by mouth daily. 09/19/23   Georgeanna Lea, MD  B Complex-C (B-COMPLEX WITH VITAMIN C) tablet Take 1 tablet by mouth daily. 09/06/19   [provider]  benzonatate (TESSALON) 100 MG capsule Take 1 capsule (100 mg total) by mouth 2 (two) times daily as needed for cough. 10/12/23   Rothfuss, Teryl Lucy, PA-C  Cholecalciferol (VITAMIN D3) 50 MCG (2000 UT) TABS Take 1 tablet by mouth daily.    [provider]  EPINEPHrine (EPIPEN 2-PAK) 0.3 mg/0.3 mL IJ SOAJ injection Inject 0.3 mg into the muscle as needed for anaphylaxis. 01/18/23   Marcelyn Bruins, MD  esomeprazole (NEXIUM) 40 MG capsule Take 1 capsule (40 mg total) by mouth 2 (two) times daily before a meal. Patient taking differently: Take 80 mg by mouth daily. 11/30/22   Olive Bass, FNP  famotidine (PEPCID) 40 MG tablet Take 1 tablet (40 mg total) by mouth daily. 11/30/22   Olive Bass, FNP  Golimumab [facility] ARIA IV) Inject 200 mg into the vein every 8 (eight) weeks.    [provider]  levocetirizine (XYZAL) 5 MG tablet Take 5 mg by mouth. 2 tablets once day    [provider]  lubiprostone (AMITIZA) 8 MCG capsule Take 8 mcg by mouth daily with breakfast. Patient not taking: Reported on 10/11/2023 10/07/23 04/04/24  [provider]  mirabegron ER (MYRBETRIQ) 50 MG TB24 tablet Take 1 tablet (50 mg total) by mouth daily. 11/30/22   Olive Bass, FNP  montelukast  (SINGULAIR) 10 MG tablet Take 1 tablet (10 mg total) by mouth at bedtime. Patient taking differently: Take 10 mg by mouth daily. 11/30/22   Olive Bass, FNP  nitroGLYCERIN (NITROSTAT) 0.4 MG SL tablet Place 1 tablet (0.4 mg total) under the tongue every 5 (five) minutes as needed for chest pain. 10/13/22   Georgeanna Lea, MD  oxyCODONE (OXY IR/ROXICODONE) 5 MG immediate release tablet Take 5 mg by mouth as needed for moderate pain (pain score 4-6) or severe pain (pain score 7-10). Very rarely    [provider]  ticagrelor (BRILINTA) 90 MG TABS tablet Take 1 tablet (90  mg total) by mouth 2 (two) times daily. 07/01/23   Lonia Blood, MD  ZOLMitriptan (ZOMIG) 2.5 MG tablet Take 1 tablet (2.5 mg total) by mouth once for 1 dose. May repeat in 2 hours if headache persists or recurs. Patient taking differently: Take 2.5 mg by mouth daily as needed for migraine. 11/30/22 09/16/23  Olive Bass, FNP    Family History Family History  Adopted: Yes  Problem Relation Age of Onset   Diabetes Mother    Hypertension Mother    Diabetes Father    Hypertension Father    Hypertension Sister    Diabetes Sister    Hypertension Brother    Diabetes Brother    Diabetes Paternal Grandmother    Cancer Other    Asthma Neg Hx    Allergic rhinitis Neg Hx    Atopy Neg Hx    Eczema Neg Hx     Social History Social History   Tobacco Use   Smoking status: Never    Passive exposure: Never   Smokeless tobacco: Never  Vaping Use   Vaping status: Never Used  Substance Use Topics   Alcohol use: Yes    Comment: rare   Drug use: Never     Allergies   Albuterol sulfate, Tape, Formoterol, Hydrocodone, Hydrocodone-acetaminophen, Nickel, Other, Oxycodone-acetaminophen, Salmeterol, Strawberry extract, Advair hfa [fluticasone-salmeterol], Proventil hfa [albuterol], Asa [aspirin], Cleocin [clindamycin], Clindamycin/lincomycin, Fludrocortisone acetate, Gabapentin,  Hydrochlorothiazide, Imipramine, Ipratropium bromide, Medroxyprogesterone, Meloxicam, Metformin and related, Nystatin, Penicillins, Pred forte [prednisolone], Prednisone, Solu-medrol [methylprednisolone], Sulfa antibiotics, Tegaderm ag mesh [silver], Toradol [ketorolac tromethamine], and Tramadol   Review of Systems Review of Systems  Respiratory:  Positive for cough, chest tightness and shortness of breath.      Physical Exam Triage Vital Signs ED Triage Vitals  Encounter Vitals Group     BP 10/12/23 1748 114/77     Systolic BP Percentile --      Diastolic BP Percentile --      Pulse Rate 10/12/23 1748 (!) 114     Resp 10/12/23 1748 (!) 26     Temp 10/12/23 1748 98.6 F (37 C)     Temp Source 10/12/23 1748 Oral     SpO2 10/12/23 1748 98 %     Weight --      Height --      Head Circumference --      Peak Flow --      Pain Score 10/12/23 1747 6     Pain Loc --      Pain Education --      Exclude from Growth Chart --    No data found.  Updated Vital Signs BP 114/77 (BP Location: Right Arm)   Pulse (!) 114   Temp 98.6 F (37 C) (Oral)   Resp (!) 26   SpO2 98%   Visual Acuity Right Eye Distance:   Left Eye Distance:   Bilateral Distance:    Right Eye Near:   Left Eye Near:    Bilateral Near:     Physical Exam Constitutional:      Appearance: She is ill-appearing.  HENT:     Head: Normocephalic and atraumatic.  Cardiovascular:     Rate and Rhythm: Normal rate and regular rhythm.  Pulmonary:     Effort: Pulmonary effort is normal.     Breath sounds: Normal breath sounds.     Comments: Spitting up white phlegm Skin:    General: Skin is warm and dry.  Neurological:  Mental Status: She is alert.      UC Treatments / Results  Labs (all labs ordered are listed, but only abnormal results are displayed) Labs Reviewed - No data to display  EKG   Radiology DG Chest 2 View Result Date: 10/12/2023 CLINICAL DATA:  Productive cough.  Vomiting. EXAM: CHEST  - 2 VIEW COMPARISON:  09/10/2023 FINDINGS: The heart size and mediastinal contours are within normal limits. Both lungs are clear. The visualized skeletal structures are unremarkable. IMPRESSION: No active cardiopulmonary disease. Electronically Signed   By: Danae Orleans M.D.   On: 10/12/2023 21:16    Procedures Procedures (including critical care time)  Medications Ordered in UC Medications  triamcinolone acetonide (KENALOG-40) injection 40 mg (40 mg Intramuscular Given 10/12/23 1930)    Initial Impression / Assessment and Plan / UC Course  I have reviewed the triage vital signs and the nursing notes.  Pertinent labs & imaging results that were available during my care of the patient were reviewed by me and considered in my medical decision making (see chart for details).     Cough-patient with productive cough.  X-ray with no acute findings.  Was already prescribed Tessalon Perles by her primary care earlier today.  Patient requesting steroid injection.  Has a lot of allergies.  Reports that the only steroid she can take is Depo-Medrol.  Depo-Medrol 40 mg given here in clinic.  Recommend Mucinex, hydrate and fluids.  She can follow-up with her primary care for any continued issues. Final Clinical Impressions(s) / UC Diagnoses   Final diagnoses:  Acute cough     Discharge Instructions      I am not seeing anything on x-ray and lungs were clear on exam.  Recommend doing some Mucinex to help with congestion. Viral test was inconclusive.  You can use albuterol as needed.  Also recommend antihistamine. Recommend follow-up with your primary care for any continued or worsening problems     ED Prescriptions   None    PDMP not reviewed this encounter.   Janace Aris, FNP 10/13/23 [phone]

## 2023-10-12 NOTE — ED Triage Notes (Signed)
 Pt woke up with a cough this morning. Reports called "Casey [name]" who sent in [place]. Reports cough has turned into "wet cough". Reports getting up lots of white phlegm.  Hx bronchitis. Tried Vick's rub on chest and neck, Halls cough drops. Pt vomited today as result of coughing.

## 2023-10-12 NOTE — Telephone Encounter (Signed)
 Red Word that prompted transfer to Nurse Triage: have bad cough needing to speak with nurse to make sure she is taking the right thing                 Chief Complaint: Non-productive cough, severe. Taking Coricidin and Hall's cough drops. Asking what else she can/should be taking. Symptoms: Above Frequency: Last night Pertinent Negatives: Patient denies fever Disposition: [] ED /[] Urgent Care (no appt availability in office) / [] Appointment(In office/virtual)/ []  [facility]/ [] Home Care/ [] Refused Recommended Disposition /[] [facility]/ [x]  Follow-up with PCP Additional Notes: Please advise pt.  Reason for Disposition  [1] Continuous (nonstop) coughing interferes with work or school AND [2] no improvement using cough treatment per Care Advice  Answer Assessment - Initial Assessment Questions 1. ONSET: "When did the cough begin?"      Last night 2. SEVERITY: "How bad is the cough today?"      Severe 3. SPUTUM: "Describe the color of your sputum" (none, dry cough; clear, white, yellow, green)     None 4. HEMOPTYSIS: "Are you coughing up any blood?" If so ask: "How much?" (flecks, streaks, tablespoons, etc.)     No 5. DIFFICULTY BREATHING: "Are you having difficulty breathing?" If Yes, ask: "How bad is it?" (e.g., mild, moderate, severe)    - MILD: No SOB at rest, mild SOB with walking, speaks normally in sentences, can lie down, no retractions, pulse < 100.    - MODERATE: SOB at rest, SOB with minimal exertion and prefers to sit, cannot lie down flat, speaks in phrases, mild retractions, audible wheezing, pulse 100-120.    - SEVERE: Very SOB at rest, speaks in single words, struggling to breathe, sitting hunched forward, retractions, pulse > 120      Mild 6. FEVER: "Do you have a fever?" If Yes, ask: "What is your temperature, how was it measured, and when did it start?"     No 7. CARDIAC HISTORY: "Do you have any history of heart disease?" (e.g., heart attack,  congestive heart failure)      Stents 8. LUNG HISTORY: "Do you have any history of lung disease?"  (e.g., pulmonary embolus, asthma, emphysema)     Asthma 9. PE RISK FACTORS: "Do you have a history of blood clots?" (or: recent major surgery, recent prolonged travel, bedridden)     No 10. OTHER SYMPTOMS: "Do you have any other symptoms?" (e.g., runny nose, wheezing, chest pain)       No 11. PREGNANCY: "Is there any chance you are pregnant?" "When was your last menstrual period?"       No 12. TRAVEL: "Have you traveled out of the country in the last month?" (e.g., travel history, exposures)       No  Protocols used: Cough - Acute Non-Productive-A-AH

## 2023-10-13 ENCOUNTER — Emergency Department (HOSPITAL_COMMUNITY)
Admission: EM | Admit: 2023-10-13 | Discharge: 2023-10-13 | Disposition: A | Discharge status: Home / Self Care | Attending: Emergency Medicine | Admitting: Emergency Medicine

## 2023-10-13 ENCOUNTER — Emergency Department (HOSPITAL_COMMUNITY)

## 2023-10-13 ENCOUNTER — Encounter (HOSPITAL_COMMUNITY): Payer: Self-pay

## 2023-10-13 ENCOUNTER — Other Ambulatory Visit: Payer: Self-pay

## 2023-10-13 DIAGNOSIS — E119 Type 2 diabetes mellitus without complications: Secondary | ICD-10-CM | POA: Diagnosis not present

## 2023-10-13 DIAGNOSIS — Z7902 Long term (current) use of antithrombotics/antiplatelets: Secondary | ICD-10-CM | POA: Insufficient documentation

## 2023-10-13 DIAGNOSIS — R509 Fever, unspecified: Secondary | ICD-10-CM | POA: Diagnosis present

## 2023-10-13 DIAGNOSIS — F419 Anxiety disorder, unspecified: Secondary | ICD-10-CM | POA: Insufficient documentation

## 2023-10-13 DIAGNOSIS — R000 Tachycardia, unspecified: Secondary | ICD-10-CM | POA: Diagnosis not present

## 2023-10-13 DIAGNOSIS — Z79899 Other long term (current) drug therapy: Secondary | ICD-10-CM | POA: Insufficient documentation

## 2023-10-13 DIAGNOSIS — J101 Influenza due to other identified influenza virus with other respiratory manifestations: Secondary | ICD-10-CM | POA: Diagnosis not present

## 2023-10-13 LAB — COMPREHENSIVE METABOLIC PANEL
ALT: 18 U/L (ref 0–44)
AST: 28 U/L (ref 15–41)
Albumin: 3.4 g/dL — ABNORMAL LOW (ref 3.5–5.0)
Alkaline Phosphatase: 84 U/L (ref 38–126)
Anion gap: 10 (ref 5–15)
BUN: 7 mg/dL (ref 6–20)
CO2: 24 mmol/L (ref 22–32)
Calcium: 9.1 mg/dL (ref 8.9–10.3)
Chloride: 103 mmol/L (ref 98–111)
Creatinine, Ser: 0.82 mg/dL (ref 0.44–1.00)
GFR, Estimated: >60 mL/min (ref >60)
Glucose, Bld: 113 mg/dL — ABNORMAL HIGH (ref 70–99)
Potassium: 3 mmol/L — ABNORMAL LOW (ref 3.5–5.1)
Sodium: 137 mmol/L (ref 135–145)
Total Bilirubin: 0.9 mg/dL (ref 0.0–1.2)
Total Protein: 7.2 g/dL (ref 6.5–8.1)

## 2023-10-13 LAB — CBC WITH DIFFERENTIAL/PLATELET
Abs Immature Granulocytes: 0.05 10*3/uL (ref 0.00–0.07)
Basophils Absolute: 0 10*3/uL (ref 0.0–0.1)
Basophils Relative: 0 %
Eosinophils Absolute: 0 10*3/uL (ref 0.0–0.5)
Eosinophils Relative: 0 %
HCT: 33.6 % — ABNORMAL LOW (ref 36.0–46.0)
Hemoglobin: 11.1 g/dL — ABNORMAL LOW (ref 12.0–15.0)
Immature Granulocytes: 1 %
Lymphocytes Relative: 7 %
Lymphs Abs: 0.7 10*3/uL (ref 0.7–4.0)
MCH: 26.9 pg (ref 26.0–34.0)
MCHC: 33 g/dL (ref 30.0–36.0)
MCV: 81.4 fL (ref 80.0–100.0)
Monocytes Absolute: 1 10*3/uL (ref 0.1–1.0)
Monocytes Relative: 12 %
Neutro Abs: 7.2 10*3/uL (ref 1.7–7.7)
Neutrophils Relative %: 80 %
Platelets: 289 10*3/uL (ref 150–400)
RBC: 4.13 MIL/uL (ref 3.87–5.11)
RDW: 15.3 % (ref 11.5–15.5)
WBC: 9 10*3/uL (ref 4.0–10.5)
nRBC: 0 % (ref 0.0–0.2)

## 2023-10-13 LAB — URINALYSIS, W/ REFLEX TO CULTURE (INFECTION SUSPECTED)
Bacteria, UA: NONE SEEN
Bilirubin Urine: NEGATIVE
Glucose, UA: NEGATIVE mg/dL
Ketones, ur: 20 mg/dL — AB
Leukocytes,Ua: NEGATIVE
Nitrite: NEGATIVE
Protein, ur: NEGATIVE mg/dL
Specific Gravity, Urine: 1.012 (ref 1.005–1.030)
pH: 8 (ref 5.0–8.0)

## 2023-10-13 LAB — RESP PANEL BY RT-PCR (RSV, FLU A&B, COVID)  RVPGX2
Influenza A by PCR: POSITIVE — AB
Influenza B by PCR: NEGATIVE
Resp Syncytial Virus by PCR: NEGATIVE
SARS Coronavirus 2 by RT PCR: NEGATIVE

## 2023-10-13 MED ORDER — SODIUM CHLORIDE 0.9 % IV BOLUS
1000.0000 mL | Freq: Once | INTRAVENOUS | Status: AC
  Administered 2023-10-13: 1000 mL via INTRAVENOUS

## 2023-10-13 MED ORDER — POTASSIUM CHLORIDE 10 MEQ/100ML IV SOLN
10.0000 meq | Freq: Once | INTRAVENOUS | Status: AC
  Administered 2023-10-13: 10 meq via INTRAVENOUS
  Filled 2023-10-13: qty 100

## 2023-10-13 MED ORDER — SODIUM CHLORIDE 0.9 % IV BOLUS
500.0000 mL | Freq: Once | INTRAVENOUS | Status: AC
  Administered 2023-10-13: 500 mL via INTRAVENOUS

## 2023-10-13 MED ORDER — ALBUTEROL SULFATE (2.5 MG/3ML) 0.083% IN NEBU: 2.5000 mg | INHALATION_SOLUTION | Freq: Four times a day (QID) | RESPIRATORY_TRACT | 1 refills | Status: DC | PRN

## 2023-10-13 MED ORDER — ACETAMINOPHEN 500 MG PO TABS
1000.0000 mg | ORAL_TABLET | Freq: Once | ORAL | Status: AC
  Administered 2023-10-13: 1000 mg via ORAL
  Filled 2023-10-13: qty 2

## 2023-10-13 MED ORDER — ONDANSETRON HCL 4 MG/2ML IJ SOLN
4.0000 mg | Freq: Once | INTRAMUSCULAR | Status: AC
  Administered 2023-10-13: 4 mg via INTRAVENOUS
  Filled 2023-10-13: qty 2

## 2023-10-13 MED ORDER — POTASSIUM CHLORIDE CRYS ER 20 MEQ PO TBCR: 40.0000 meq | EXTENDED_RELEASE_TABLET | Freq: Once | ORAL | Status: DC

## 2023-10-13 MED ORDER — OSELTAMIVIR PHOSPHATE 75 MG PO CAPS: 75.0000 mg | ORAL_CAPSULE | Freq: Two times a day (BID) | ORAL | 0 refills | Status: DC

## 2023-10-13 MED ORDER — BENZONATATE 100 MG PO CAPS
200.0000 mg | ORAL_CAPSULE | Freq: Once | ORAL | Status: AC
  Administered 2023-10-13: 200 mg via ORAL
  Filled 2023-10-13: qty 2

## 2023-10-13 MED ORDER — ONDANSETRON 4 MG PO TBDP: 4.0000 mg | ORAL_TABLET | Freq: Four times a day (QID) | ORAL | 0 refills | Status: DC | PRN

## 2023-10-13 NOTE — Discharge Instructions (Addendum)
 You have tested positive for influenza A this is likely contributing to your ongoing coughing and symptoms.  Your chest x-ray did not show any signs of pneumonia today.  Use prescribed Zofran as needed for any nausea or vomiting and I encourage that you take this prior to taking Tamiflu.  Given your underlying immunocompromise from your rheumatoid arthritis we have prescribed Tamiflu which you should take twice daily for the next 5 days.  This medication most commonly causes some GI upset.  You can continue to use the [employer] that was prescribed for you to help with cough from your primary care office.  Trying to keep small amounts of fluids on the stomach or using warm tea with honey can also help soothe cough.  I have also refilled your nebulizer solution this can sometimes help with coughing fits as well.   If your oxygen saturation does dip below 90% or your heart rate is persistently above 120 and even when you are at rest or you feel like you are having increasing difficulty breathing or unable to keep down fluids or medications at all you should return for reevaluation.

## 2023-10-13 NOTE — ED Notes (Signed)
 Administered tylenol with gingerale per patient request, patient was not able to keep the gingerale down. This nurse asked patient if she would anything for her nausea and patient stated she does not want any nausea medications. Patient stated that it never works for her even if its IV route

## 2023-10-13 NOTE — ED Provider Notes (Signed)
 [place] EMERGENCY DEPARTMENT AT [facility] Provider Note   CSN: [id number] Arrival date & time: 10/13/23  4098     History  Chief Complaint  Patient presents with   Shortness of Breath    Casey [name] is a 53 y.o. female.  Casey [name] is a 53 y.o. female with a history of rheumatoid arthritis, asthma, diabetes, bariatric surgery, DVT, who presents to the emergency department for evaluation of cough and general malaise.  Symptoms have been present for about 24 hours.  Patient reports persistent coughing with mucus production.  Patient reports frequent posttussive emesis due to force of cough.  She has had some subjective fevers and chills and reports with EMS of temperature of 100.4.  She reports some shortness of breath.  She is supposed to use 2 L nasal cannula at home when sleeping but reports that the concentrator was too loud so that she returned it.  She has used 4 albuterol treatments in the last 24 hours without improvement in her symptoms.  She was seen at urgent care and reports that they tried to test her for COVID and flu but had issues with the testing.  Due to worsening of her symptoms she came in for further evaluation.  She reports that because her vomiting is often after coughing that nausea medicine typically does not help with the symptoms.  Patient is on immunologic therapy for her rheumatoid arthritis.  The history is provided by the patient and medical records.  Shortness of Breath Associated symptoms: chest pain, cough, fever and vomiting   Associated symptoms: no abdominal pain        Home Medications Prior to Admission medications   Medication Sig Start Date End Date Taking? Authorizing Provider  ondansetron (ZOFRAN-ODT) 4 MG disintegrating tablet Take 1 tablet (4 mg total) by mouth every 6 (six) hours as needed for nausea or vomiting. 10/13/23  Yes Rosezella Rumpf, PA-C  oseltamivir (TAMIFLU) 75 MG capsule Take 1 capsule (75 mg total) by mouth  every 12 (twelve) hours. 10/13/23  Yes Rosezella Rumpf, PA-C  acetaminophen (TYLENOL) 500 MG tablet Take 1,000 mg by mouth every 12 (twelve) hours as needed for mild pain or moderate pain.    [provider]  albuterol (PROVENTIL) (2.5 MG/3ML) 0.083% nebulizer solution Take 3 mLs (2.5 mg total) by nebulization every 6 (six) hours as needed for wheezing or shortness of breath. 10/13/23   Rosezella Rumpf, PA-C  amLODipine (NORVASC) 10 MG tablet Take 1 tablet (10 mg total) by mouth daily. 08/08/23 11/06/23  Georgeanna Lea, MD  atorvastatin (LIPITOR) 80 MG tablet Take 1 tablet (80 mg total) by mouth daily. 09/19/23   Georgeanna Lea, MD  B Complex-C (B-COMPLEX WITH VITAMIN C) tablet Take 1 tablet by mouth daily. 09/06/19   [provider]  benzonatate (TESSALON) 100 MG capsule Take 1 capsule (100 mg total) by mouth 2 (two) times daily as needed for cough. 10/12/23   Rothfuss, Teryl Lucy, PA-C  Cholecalciferol (VITAMIN D3) 50 MCG (2000 UT) TABS Take 1 tablet by mouth daily.    [provider]  clopidogrel (PLAVIX) 75 MG tablet Take 75 mg by mouth daily.    [provider]  EPINEPHrine (EPIPEN 2-PAK) 0.3 mg/0.3 mL IJ SOAJ injection Inject 0.3 mg into the muscle as needed for anaphylaxis. 01/18/23   Marcelyn Bruins, MD  eplerenone (INSPRA) 25 MG tablet Take 50 mg by mouth daily.    [provider]  esomeprazole (NEXIUM) 40 MG capsule Take 1 capsule (40 mg total) by mouth 2 (two) times daily before a meal. Patient taking differently: Take 80 mg by mouth daily. 11/30/22   Olive Bass, FNP  famotidine (PEPCID) 40 MG tablet Take 1 tablet (40 mg total) by mouth daily. 11/30/22   Olive Bass, FNP  Golimumab [facility] ARIA IV) Inject 200 mg into the vein every 8 (eight) weeks.    [provider]  levocetirizine (XYZAL) 5 MG tablet Take 5 mg by mouth. 2 tablets once day    [provider]  linaclotide (LINZESS) 145 MCG CAPS  capsule Take 145 mcg by mouth daily before breakfast.    [provider]  lubiprostone (AMITIZA) 8 MCG capsule Take 8 mcg by mouth daily with breakfast. Patient not taking: Reported on 10/11/2023 10/07/23 04/04/24  [provider]  mirabegron ER (MYRBETRIQ) 50 MG TB24 tablet Take 1 tablet (50 mg total) by mouth daily. 11/30/22   Olive Bass, FNP  montelukast (SINGULAIR) 10 MG tablet Take 1 tablet (10 mg total) by mouth at bedtime. Patient taking differently: Take 10 mg by mouth daily. 11/30/22   Olive Bass, FNP  nitroGLYCERIN (NITROSTAT) 0.4 MG SL tablet Place 1 tablet (0.4 mg total) under the tongue every 5 (five) minutes as needed for chest pain. 10/13/22   Georgeanna Lea, MD  oxyCODONE (OXY IR/ROXICODONE) 5 MG immediate release tablet Take 5 mg by mouth as needed for moderate pain (pain score 4-6) or severe pain (pain score 7-10). Very rarely    [provider]  ranolazine (RANEXA) 500 MG 12 hr tablet Take 500 mg by mouth 2 (two) times daily.    [provider]  ticagrelor (BRILINTA) 90 MG TABS tablet Take 1 tablet (90 mg total) by mouth 2 (two) times daily. 07/01/23   Lonia Blood, MD  ZOLMitriptan (ZOMIG) 2.5 MG tablet Take 1 tablet (2.5 mg total) by mouth once for 1 dose. May repeat in 2 hours if headache persists or recurs. Patient taking differently: Take 2.5 mg by mouth daily as needed for migraine. 11/30/22 09/16/23  Olive Bass, FNP      Allergies    Albuterol sulfate, Tape, Formoterol, Hydrocodone, Hydrocodone-acetaminophen, Nickel, Other, Oxycodone-acetaminophen, Salmeterol, Strawberry extract, Advair hfa [fluticasone-salmeterol], Proventil hfa [albuterol], Asa [aspirin], Cleocin [clindamycin], Clindamycin/lincomycin, Fludrocortisone acetate, Gabapentin, Hydrochlorothiazide, Imipramine, Ipratropium bromide, Medroxyprogesterone, Meloxicam, Metformin and related, Nystatin, Penicillins, Pred forte [prednisolone],  Prednisone, Solu-medrol [methylprednisolone], Sulfa antibiotics, Tegaderm ag mesh [silver], Toradol [ketorolac tromethamine], and Tramadol    Review of Systems   Review of Systems  Constitutional:  Positive for chills and fever.  HENT:  Positive for congestion and rhinorrhea.   Respiratory:  Positive for cough and shortness of breath.   Cardiovascular:  Positive for chest pain.  Gastrointestinal:  Positive for vomiting. Negative for abdominal pain and nausea.  Musculoskeletal:  Positive for myalgias.    Physical Exam Updated Vital Signs BP 107/88   Pulse 98   Temp 100 F (37.8 C) (Oral)   Resp 19   Ht 5\' 3"  (1.6 m)   Wt 102.4 kg   SpO2 94%   BMI 39.99 kg/m  Physical Exam Vitals and nursing note reviewed.  Constitutional:      General: She is not in acute distress.    Appearance: Normal appearance. She is well-developed. She is not diaphoretic.     Comments: Appears anxious, with frequent coughing but in no acute distress  HENT:  Head: Normocephalic and atraumatic.  Eyes:     General:        Right eye: No discharge.        Left eye: No discharge.     Pupils: Pupils are equal, round, and reactive to light.  Cardiovascular:     Rate and Rhythm: Normal rate and regular rhythm.     Pulses: Normal pulses.     Heart sounds: Normal heart sounds.  Pulmonary:     Effort: Pulmonary effort is normal. No respiratory distress.     Breath sounds: Normal breath sounds. No wheezing or rales.     Comments: Respirations equal and unlabored, patient able to speak in full sentences, lungs clear to auscultation bilaterally  Chest:     Chest wall: No tenderness.  Abdominal:     General: Bowel sounds are normal. There is no distension.     Palpations: Abdomen is soft. There is no mass.     Tenderness: There is no abdominal tenderness. There is no guarding.     Comments: Abdomen soft, nondistended, nontender to palpation in all quadrants without guarding or peritoneal signs   Musculoskeletal:        General: No deformity.     Cervical back: Neck supple.     Right lower leg: No tenderness. No edema.     Left lower leg: No tenderness. No edema.  Skin:    General: Skin is warm and dry.     Capillary Refill: Capillary refill takes less than 2 seconds.  Neurological:     Mental Status: She is alert and oriented to person, place, and time.     Coordination: Coordination normal.     Comments: Speech is clear, able to follow commands CN III-XII intact Normal strength in upper and lower extremities bilaterally including dorsiflexion and plantar flexion, strong and equal grip strength Sensation normal to light and sharp touch Moves extremities without ataxia, coordination intact  Psychiatric:        Mood and Affect: Mood normal.        Behavior: Behavior normal.     ED Results / Procedures / Treatments   Labs (all labs ordered are listed, but only abnormal results are displayed) Labs Reviewed  RESP PANEL BY RT-PCR (RSV, FLU A&B, COVID)  RVPGX2 - Abnormal; Notable for the following components:      Result Value   Influenza A by PCR POSITIVE (*)    All other components within normal limits  COMPREHENSIVE METABOLIC PANEL - Abnormal; Notable for the following components:   Potassium 3.0 (*)    Glucose, Bld 113 (*)    Albumin 3.4 (*)    All other components within normal limits  CBC WITH DIFFERENTIAL/PLATELET - Abnormal; Notable for the following components:   Hemoglobin 11.1 (*)    HCT 33.6 (*)    All other components within normal limits  CULTURE, BLOOD (ROUTINE X 2)  CULTURE, BLOOD (ROUTINE X 2)  URINALYSIS, W/ REFLEX TO CULTURE (INFECTION SUSPECTED)    EKG EKG Interpretation Date/Time:  Thursday October 13 2023 10:44:04 EST Ventricular Rate:  104 PR Interval:  161 QRS Duration:  99 QT Interval:  350 QTC Calculation: 461 R Axis:   -27  Text Interpretation: Sinus tachycardia Borderline left axis deviation Borderline low voltage, extremity leads  Confirmed by Alvester Chou [phone]) on 10/13/2023 10:55:04 AM  Radiology DG Chest 2 View Result Date: 10/13/2023 CLINICAL DATA:  Cough and fever. EXAM: CHEST - 2 VIEW COMPARISON:  Two-view chest x-ray  10/12/2023. FINDINGS: The heart size is upper limits of normal, exaggerated by low lung volumes. Mild pulmonary vascular congestion is present without frank edema or effusions. No focal airspace disease is present. The visualized soft tissues and bony thorax are unremarkable. IMPRESSION: Low lung volumes and mild pulmonary vascular congestion. Electronically Signed   By: Marin Roberts M.D.   On: 10/13/2023 11:46   DG Chest 2 View Result Date: 10/12/2023 CLINICAL DATA:  Productive cough.  Vomiting. EXAM: CHEST - 2 VIEW COMPARISON:  09/10/2023 FINDINGS: The heart size and mediastinal contours are within normal limits. Both lungs are clear. The visualized skeletal structures are unremarkable. IMPRESSION: No active cardiopulmonary disease. Electronically Signed   By: Danae Orleans M.D.   On: 10/12/2023 21:16    Procedures Procedures    Medications Ordered in ED Medications  potassium chloride 10 mEq in 100 mL IVPB (10 mEq Intravenous New Bag/Given 10/13/23 1456)  ondansetron (ZOFRAN) injection 4 mg (4 mg Intravenous Given 10/13/23 1451)  sodium chloride 0.9 % bolus 500 mL (0 mLs Intravenous Stopped 10/13/23 1244)  acetaminophen (TYLENOL) tablet 1,000 mg (1,000 mg Oral Given 10/13/23 1320)  sodium chloride 0.9 % bolus 1,000 mL (1,000 mLs Intravenous New Bag/Given 10/13/23 1456)  benzonatate (TESSALON) capsule 200 mg (200 mg Oral Given 10/13/23 1457)    ED Course/ Medical Decision Making/ A&P                               Medical Decision Making Amount and/or Complexity of Data Reviewed Labs: ordered. Decision-making details documented in ED Course. Radiology: ordered.  Risk OTC drugs. Prescription drug management.   53 year old female presents with cough with posttussive emesis and shortness of  breath.  Low-grade temp with EMS and patient noted to be tachycardic.  Given underlying immunocompromise from immunologic therapy for rheumatoid arthritis will send blood cultures as well as basic labs, urinalysis, flu, COVID and RSV panel as well as chest x-ray and EKG.  Laboratory evaluation with no leukocytosis, stable chronic anemia, some hypokalemia present with potassium of 3.0, likely in the setting of frequent posttussive emesis, no other significant electrolyte derangements and normal renal and liver function.  Urinalysis without signs of infection.  Viral panel returned positive for influenza A which likely explains patient's symptoms.  Blood cultures are pending.   Tachycardia resolved in the ED with IV fluids and Tylenol.  Patient given cough medication nausea medication and potassium replacement here in the ED and.  Had extensive discussion with patient about supportive care for influenza.  Patient expressed concern about coughing and posttussive emesis.  Encouraged small frequent sips of fluids and using prescribed Tessalon Perles which she has at home already.  Patient was able to ambulate here in the emergency department to the bathroom and back to her room and maintaining normal O2 sats with mild tachycardia.  I had shared decision-making discussion with patient and given that she is maintaining normal O2 sats and has been able to keep down some fluids here in the ED I feel that at this point she is appropriate for outpatient management and supportive care, given underlying immunocompromise will prescribe Tamiflu as well as Zofran and refill of albuterol nebulizer solution.  Patient has home pulse ox and I discussed strict return precautions and close outpatient follow-up  At this time there does not appear to be any evidence of an acute emergency medical condition requiring further emergent evaluation and the patient appears stable  for discharge with appropriate outpatient follow up.  Diagnosis and return precautions discussed with patient who verbalizes understanding and is agreeable to discharge.           Final Clinical Impression(s) / ED Diagnoses Final diagnoses:  Influenza A    Rx / DC Orders ED Discharge Orders          Ordered    ondansetron (ZOFRAN-ODT) 4 MG disintegrating tablet  Every 6 hours PRN        10/13/23 1540    albuterol (PROVENTIL) (2.5 MG/3ML) 0.083% nebulizer solution  Every 6 hours PRN        10/13/23 1540    oseltamivir (TAMIFLU) 75 MG capsule  Every 12 hours        10/13/23 1540              Rosezella Rumpf, [place] 10/19/23 2328    Terald Sleeper, MD 10/20/23 [phone]

## 2023-10-13 NOTE — ED Triage Notes (Signed)
 BIB [facility] from home for cough, general malaise for 24 hours, chest pressure with coughing thin bubbly mucus production noted with EMS. Patient has history of asthma. Patient is supposed to be on 2L [place] but patient does not wear it at home. Patient self administered 4 albuterol treatment in the last 24 hours without relief.

## 2023-10-14 ENCOUNTER — Other Ambulatory Visit (HOSPITAL_BASED_OUTPATIENT_CLINIC_OR_DEPARTMENT_OTHER): Payer: 59 | Admitting: Radiology

## 2023-10-14 ENCOUNTER — Encounter (HOSPITAL_BASED_OUTPATIENT_CLINIC_OR_DEPARTMENT_OTHER): Payer: Self-pay

## 2023-10-15 LAB — BLOOD CULTURE ID PANEL (REFLEXED) - BCID2

## 2023-10-16 ENCOUNTER — Emergency Department (HOSPITAL_COMMUNITY)

## 2023-10-16 ENCOUNTER — Other Ambulatory Visit: Payer: Self-pay

## 2023-10-16 ENCOUNTER — Emergency Department (HOSPITAL_COMMUNITY)
Admission: EM | Admit: 2023-10-16 | Discharge: 2023-10-17 | Disposition: A | Discharge status: Home / Self Care | Attending: Emergency Medicine | Admitting: Emergency Medicine

## 2023-10-16 ENCOUNTER — Encounter (HOSPITAL_COMMUNITY): Payer: Self-pay

## 2023-10-16 DIAGNOSIS — R0602 Shortness of breath: Secondary | ICD-10-CM | POA: Insufficient documentation

## 2023-10-16 DIAGNOSIS — J45909 Unspecified asthma, uncomplicated: Secondary | ICD-10-CM | POA: Diagnosis not present

## 2023-10-16 DIAGNOSIS — E119 Type 2 diabetes mellitus without complications: Secondary | ICD-10-CM | POA: Diagnosis not present

## 2023-10-16 DIAGNOSIS — Z7901 Long term (current) use of anticoagulants: Secondary | ICD-10-CM | POA: Insufficient documentation

## 2023-10-16 LAB — BASIC METABOLIC PANEL
Anion gap: 8 (ref 5–15)
BUN: 5 mg/dL — ABNORMAL LOW (ref 6–20)
CO2: 25 mmol/L (ref 22–32)
Calcium: 8.9 mg/dL (ref 8.9–10.3)
Chloride: 104 mmol/L (ref 98–111)
Creatinine, Ser: 0.81 mg/dL (ref 0.44–1.00)
GFR, Estimated: >60 mL/min (ref >60)
Glucose, Bld: 102 mg/dL — ABNORMAL HIGH (ref 70–99)
Potassium: 3.3 mmol/L — ABNORMAL LOW (ref 3.5–5.1)
Sodium: 137 mmol/L (ref 135–145)

## 2023-10-16 LAB — TROPONIN I (HIGH SENSITIVITY)
Troponin I (High Sensitivity): 4 ng/L (ref <18)
Troponin I (High Sensitivity): 4 ng/L (ref <18)

## 2023-10-16 LAB — CBC
HCT: 37.3 % (ref 36.0–46.0)
Hemoglobin: 11.8 g/dL — ABNORMAL LOW (ref 12.0–15.0)
MCH: 26.6 pg (ref 26.0–34.0)
MCHC: 31.6 g/dL (ref 30.0–36.0)
MCV: 84.2 fL (ref 80.0–100.0)
Platelets: 313 10*3/uL (ref 150–400)
RBC: 4.43 MIL/uL (ref 3.87–5.11)
RDW: 15.2 % (ref 11.5–15.5)
WBC: 6.7 10*3/uL (ref 4.0–10.5)
nRBC: 0 % (ref 0.0–0.2)

## 2023-10-16 NOTE — ED Triage Notes (Signed)
 Pt bib ems, dx with flu on 3/6, pt called due to hypoxia at 88%. When ems arrived pt was doing a neb treatment.  SPO2 en route maintained 96% on room air.

## 2023-10-16 NOTE — ED Notes (Signed)
 Pt reports she feels like an elephant is sitting on her chest, describes it at chest pain, worse with coughing.

## 2023-10-16 NOTE — ED Provider Notes (Signed)
 Triage note Medical screening  Patient with acute shortness of breath this morning.  According to EMS pulse ox was 88% on room air patient did do a nebulizer treatment now satting about 96% but she still feels short of breath.  Patient was seen March 6 developed flulike symptoms on March 5 was positive for influenza A on the 6 and chest x-ray read was negative.  Patient also treated with Tamiflu she thinks it actually was helping but felt worse today.  EKG done was normal sinus rhythm.  Patient has autoimmune problems questionable lupus.  Temp here 98.2 pulse 86 respirations 18 blood pressure 118/71 oxygen saturation on room air is 96%. Patient nontoxic no acute distress Lungs are clear bilaterally with occasional faint wheezes.  And some rhonchi. Heart regular rate and rhythm Abdomen soft nontender  Will get chest x-ray basic labs clinical concern would be for development of pneumonia.   Vanetta Mulders, MD 10/16/23 [phone]

## 2023-10-17 LAB — CULTURE, BLOOD (ROUTINE X 2): Special Requests: ADEQUATE

## 2023-10-17 NOTE — Discharge Instructions (Addendum)
 Your workup this evening was reassuring with no signs of pneumonia on chest x-ray.  Please complete the course of Tamiflu previously prescribed.  Influenza symptoms typically last for 5 days to a week.  Follow-up with your primary care provider for further evaluation of your shortness of breath.  If you do see oxygen saturations persistently below 90% you should come back for further evaluation.  This evening your oxygen has maintained above 95%.

## 2023-10-17 NOTE — ED Notes (Signed)
 Ambulated pt with spO2. Pt had steady gait and complained of slight dizziness, but stated it wasn't new as it had accompanied her flu symptoms. Pt also complained of not being able to take full breaths, but stated she did not feel short of breath.

## 2023-10-17 NOTE — ED Provider Notes (Signed)
 [place] EMERGENCY DEPARTMENT AT [facility] Provider Note   CSN: [id number] Arrival date & time: 10/16/23  1452     History  No chief complaint on file.   Casey [name] is a 53 y.o. female.  Patient with past medical history significant for rheumatoid arthritis, Crohn's disease, history of DVT, asthma, type II DM presents to the emergency department complaining of continued shortness of breath.  Patient was diagnosed with the flu on March 6 and was prescribed Tamiflu.  She states she has been doing better at home but was advised that if her heart rate goes above 120 or that if her oxygen drops below 90% she should come back for reevaluation.  She states that this morning when walking she became short of breath and noticed that her oxygen saturation was in the 80s and that her heart rate was at approximately 130.  She called EMS for transport.  Prior to transport she used an albuterol nebulized treatment at home.  She endorses cough, continued subjective fever, shortness of breath.  She denies chest pain, abdominal pain, nausea, vomiting.  HPI     Home Medications Prior to Admission medications   Medication Sig Start Date End Date Taking? Authorizing Provider  acetaminophen (TYLENOL) 500 MG tablet Take 1,000 mg by mouth every 12 (twelve) hours as needed for mild pain or moderate pain.    [provider]  albuterol (PROVENTIL) (2.5 MG/3ML) 0.083% nebulizer solution Take 3 mLs (2.5 mg total) by nebulization every 6 (six) hours as needed for wheezing or shortness of breath. 10/13/23   Rosezella Rumpf, PA-C  amLODipine (NORVASC) 10 MG tablet Take 1 tablet (10 mg total) by mouth daily. 08/08/23 11/06/23  Georgeanna Lea, MD  atorvastatin (LIPITOR) 80 MG tablet Take 1 tablet (80 mg total) by mouth daily. 09/19/23   Georgeanna Lea, MD  B Complex-C (B-COMPLEX WITH VITAMIN C) tablet Take 1 tablet by mouth daily. 09/06/19   [provider]  benzonatate (TESSALON) 100  MG capsule Take 1 capsule (100 mg total) by mouth 2 (two) times daily as needed for cough. 10/12/23   Rothfuss, Teryl Lucy, PA-C  Cholecalciferol (VITAMIN D3) 50 MCG (2000 UT) TABS Take 1 tablet by mouth daily.    [provider]  clopidogrel (PLAVIX) 75 MG tablet Take 75 mg by mouth daily.    [provider]  EPINEPHrine (EPIPEN 2-PAK) 0.3 mg/0.3 mL IJ SOAJ injection Inject 0.3 mg into the muscle as needed for anaphylaxis. 01/18/23   Marcelyn Bruins, MD  eplerenone (INSPRA) 25 MG tablet Take 50 mg by mouth daily.    [provider]  esomeprazole (NEXIUM) 40 MG capsule Take 1 capsule (40 mg total) by mouth 2 (two) times daily before a meal. Patient taking differently: Take 80 mg by mouth daily. 11/30/22   Olive Bass, FNP  famotidine (PEPCID) 40 MG tablet Take 1 tablet (40 mg total) by mouth daily. 11/30/22   Olive Bass, FNP  Golimumab [facility] ARIA IV) Inject 200 mg into the vein every 8 (eight) weeks.    [provider]  levocetirizine (XYZAL) 5 MG tablet Take 5 mg by mouth. 2 tablets once day    [provider]  linaclotide (LINZESS) 145 MCG CAPS capsule Take 145 mcg by mouth daily before breakfast.    [provider]  lubiprostone (AMITIZA) 8 MCG capsule Take 8 mcg by mouth daily with breakfast. Patient not taking: Reported on 10/11/2023 10/07/23 04/04/24  [provider]  mirabegron ER (MYRBETRIQ) 50 MG TB24 tablet Take 1 tablet (50 mg total) by mouth daily. 11/30/22   Olive Bass, FNP  montelukast (SINGULAIR) 10 MG tablet Take 1 tablet (10 mg total) by mouth at bedtime. Patient taking differently: Take 10 mg by mouth daily. 11/30/22   Olive Bass, FNP  nitroGLYCERIN (NITROSTAT) 0.4 MG SL tablet Place 1 tablet (0.4 mg total) under the tongue every 5 (five) minutes as needed for chest pain. 10/13/22   Georgeanna Lea, MD  ondansetron (ZOFRAN-ODT) 4 MG disintegrating tablet Take 1 tablet (4  mg total) by mouth every 6 (six) hours as needed for nausea or vomiting. 10/13/23   Rosezella Rumpf, PA-C  oseltamivir (TAMIFLU) 75 MG capsule Take 1 capsule (75 mg total) by mouth every 12 (twelve) hours. 10/13/23   Rosezella Rumpf, PA-C  oxyCODONE (OXY IR/ROXICODONE) 5 MG immediate release tablet Take 5 mg by mouth as needed for moderate pain (pain score 4-6) or severe pain (pain score 7-10). Very rarely    [provider]  ranolazine (RANEXA) 500 MG 12 hr tablet Take 500 mg by mouth 2 (two) times daily.    [provider]  ticagrelor (BRILINTA) 90 MG TABS tablet Take 1 tablet (90 mg total) by mouth 2 (two) times daily. 07/01/23   Lonia Blood, MD  ZOLMitriptan (ZOMIG) 2.5 MG tablet Take 1 tablet (2.5 mg total) by mouth once for 1 dose. May repeat in 2 hours if headache persists or recurs. Patient taking differently: Take 2.5 mg by mouth daily as needed for migraine. 11/30/22 09/16/23  Olive Bass, FNP      Allergies    Albuterol sulfate, Tape, Formoterol, Hydrocodone, Hydrocodone-acetaminophen, Nickel, Other, Oxycodone-acetaminophen, Salmeterol, Strawberry extract, Advair hfa [fluticasone-salmeterol], Proventil hfa [albuterol], Asa [aspirin], Cleocin [clindamycin], Clindamycin/lincomycin, Fludrocortisone acetate, Gabapentin, Hydrochlorothiazide, Imipramine, Ipratropium bromide, Medroxyprogesterone, Meloxicam, Metformin and related, Nystatin, Penicillins, Pred forte [prednisolone], Prednisone, Solu-medrol [methylprednisolone], Sulfa antibiotics, Tegaderm ag mesh [silver], Toradol [ketorolac tromethamine], and Tramadol    Review of Systems   Review of Systems  Physical Exam Updated Vital Signs BP (!) 141/90 (BP Location: Left Arm)   Pulse 89   Temp 98.7 F (37.1 C) (Oral)   Resp 18   Ht 5\' 3"  (1.6 m)   Wt 102.1 kg   SpO2 100%   BMI 39.86 kg/m  Physical Exam Vitals and nursing note reviewed.  Constitutional:      General: She is not in acute distress.     Appearance: She is well-developed.  HENT:     Head: Normocephalic and atraumatic.  Eyes:     Conjunctiva/sclera: Conjunctivae normal.  Cardiovascular:     Rate and Rhythm: Normal rate and regular rhythm.  Pulmonary:     Effort: Pulmonary effort is normal. No respiratory distress.     Breath sounds: Rhonchi present.  Musculoskeletal:        General: No swelling.     Cervical back: Neck supple.  Skin:    General: Skin is warm and dry.     Capillary Refill: Capillary refill takes less than 2 seconds.  Neurological:     Mental Status: She is alert.  Psychiatric:        Mood and Affect: Mood normal.     ED Results / Procedures / Treatments   Labs (all labs ordered are listed, but only abnormal results are displayed) Labs Reviewed  BASIC METABOLIC PANEL - Abnormal; Notable for the following components:  Result Value   Potassium 3.3 (*)    Glucose, Bld 102 (*)    BUN 5 (*)    All other components within normal limits  CBC - Abnormal; Notable for the following components:   Hemoglobin 11.8 (*)    All other components within normal limits  TROPONIN I (HIGH SENSITIVITY)  TROPONIN I (HIGH SENSITIVITY)    EKG EKG Interpretation Date/Time:  Sunday October 16 2023 15:24:39 EDT Ventricular Rate:  84 PR Interval:  162 QRS Duration:  84 QT Interval:  382 QTC Calculation: 451 R Axis:   -17  Text Interpretation: Normal sinus rhythm Normal ECG When compared with ECG of 13-Oct-2023 10:44, PREVIOUS ECG IS PRESENT Confirmed by Vanetta Mulders [phone]) on 10/16/2023 3:38:58 PM  Radiology DG Chest 2 View Result Date: 10/16/2023 CLINICAL DATA:  Chest pain EXAM: CHEST - 2 VIEW COMPARISON:  X-ray 10/13/2023 and older FINDINGS: Underinflation. Minimal linear opacity left lung base likely scar or atelectasis. Normal cardiopericardial silhouette without edema when adjusting for level of inflation. No consolidation, pneumothorax or effusion. Films are under penetrated. IMPRESSION:  Underinflation.  Slight left basilar scar or atelectasis. Electronically Signed   By: Karen Kays M.D.   On: 10/16/2023 17:22    Procedures Procedures    Medications Ordered in ED Medications - No data to display  ED Course/ Medical Decision Making/ A&P                                 Medical Decision Making Amount and/or Complexity of Data Reviewed Labs: ordered. Radiology: ordered.   This patient presents to the ED for concern of shortness of breath, this involves an extensive number of treatment options, and is a complaint that carries with it a high risk of complications and morbidity.  The differential diagnosis includes influenza, respiratory failure, pneumonia,, ACS, others   Co morbidities that complicate the patient evaluation  RA, influenza   Additional history obtained:  Additional history obtained from EMS External records from outside source obtained and reviewed including previous lab results   Lab Tests:  I Ordered, and personally interpreted labs.  The pertinent results include: Troponin 4 with no delta change, potassium 3.3, unremarkable CBC with no leukocytosis   Imaging Studies ordered:  I ordered imaging studies including chest x-ray I independently visualized and interpreted imaging which showed Underinflation.  Slight left basilar scar or atelectasis.  I agree with the radiologist interpretation   Cardiac Monitoring: / EKG:  The patient was maintained on a cardiac monitor.  I personally viewed and interpreted the cardiac monitored which showed an underlying rhythm of: Sinus rhythm   Test / Admission - Considered:  Patient maintain SpO2 of 100% while ambulating.  Negative troponins x 2 with a nonischemic EKG.  No tachycardia noted during ambulation.  Patient with known influenza infection.  I did offer the patient a DuoNeb treatment the patient declined stating she has allergic reactions to DuoNebs.  At this time I see no indication for  further emergent workup or admission.  Patient is stable for discharge home.  Return precautions provided.         Final Clinical Impression(s) / ED Diagnoses Final diagnoses:  Shortness of breath    Rx / DC Orders ED Discharge Orders     None         Pamala Duffel 10/17/23 0315    Nira Conn, MD 10/17/23 [phone]

## 2023-10-18 ENCOUNTER — Encounter (HOSPITAL_BASED_OUTPATIENT_CLINIC_OR_DEPARTMENT_OTHER): Payer: Self-pay | Admitting: Student

## 2023-10-18 ENCOUNTER — Telehealth (HOSPITAL_BASED_OUTPATIENT_CLINIC_OR_DEPARTMENT_OTHER): Payer: Self-pay

## 2023-10-18 ENCOUNTER — Encounter: Payer: Self-pay | Admitting: Allergy

## 2023-10-18 LAB — CULTURE, BLOOD (ROUTINE X 2): Culture: NO GROWTH

## 2023-10-18 NOTE — Telephone Encounter (Signed)
 Post ED Visit - Positive Culture Follow-up  Culture report reviewed by antimicrobial stewardship pharmacist: [facility] []  Enzo Bi, Pharm.D. []  Celedonio Miyamoto, Pharm.D., BCPS AQ-ID []  Garvin Fila, Pharm.D., BCPS []  Georgina Pillion, [address].D., BCPS []  McKay, [address].D., BCPS, AAHIVP []  Estella Husk, Pharm.D., BCPS, AAHIVP []  Lysle Pearl, PharmD, BCPS []  Phillips Climes, PharmD, BCPS []  Agapito Games, PharmD, BCPS []  Verlan Friends, PharmD []  Mervyn Gay, PharmD, BCPS []  Vinnie Level, PharmD X   Daylene Posey, PharmD  [facility] []  Len Childs, PharmD []  Greer Pickerel, PharmD []  Adalberto Cole, PharmD []  Perlie Gold, Rph []  Lonell Face) Jean Rosenthal, PharmD []  Earl Many, PharmD []  Junita Push, PharmD []  Dorna Leitz, PharmD []  Terrilee Files, PharmD []  Lynann Beaver, PharmD []  Keturah Barre, PharmD []  Loralee Pacas, PharmD []  Bernadene Person, PharmD  Positive blood culture "1/4 likely contaminant called in 3/8. Pt since returned to ED 3/9 and discharged. Ok "  Arvid Right 10/18/2023, 9:32 AM

## 2023-10-19 ENCOUNTER — Ambulatory Visit (HOSPITAL_BASED_OUTPATIENT_CLINIC_OR_DEPARTMENT_OTHER): Payer: 59 | Admitting: Student

## 2023-10-19 ENCOUNTER — Encounter (HOSPITAL_BASED_OUTPATIENT_CLINIC_OR_DEPARTMENT_OTHER): Payer: Self-pay | Admitting: Student

## 2023-10-19 ENCOUNTER — Ambulatory Visit (HOSPITAL_BASED_OUTPATIENT_CLINIC_OR_DEPARTMENT_OTHER): Admitting: Student

## 2023-10-19 VITALS — BP 127/75 | HR 76 | Temp 98.7°F | Ht 63.0 in | Wt 219.6 lb

## 2023-10-19 DIAGNOSIS — Z6838 Body mass index (BMI) 38.0-38.9, adult: Secondary | ICD-10-CM

## 2023-10-19 DIAGNOSIS — R051 Acute cough: Secondary | ICD-10-CM | POA: Diagnosis not present

## 2023-10-19 MED ORDER — BUDESONIDE 0.25 MG/2ML IN SUSP: 0.2500 mg | Freq: Every day | RESPIRATORY_TRACT | 5 refills | Status: DC

## 2023-10-19 NOTE — Progress Notes (Signed)
   Acute Office Visit  Subjective:     Patient ID: Casey [name], female    DOB: 1971/03/28, 53 y.o.   MRN: [id number]  Chief Complaint  Patient presents with   Cough    Had the flu and still coughing. Benzonatate pearls are not helping. Has a form she needs filled out. O2 dropped to 88%. Having bloody nose from coughing.     HPI  Cough- States that it is worse at night. Has not slept well in the past week. Coughing for around 10 minutes. Getting bloody nose associated. States that she has been improving but the cough is just lingering. O2 sat is good today.  Discussed treatment options.  ROS  Per HPI    Objective:    BP 127/75 (BP Location: Right Arm, Patient Position: Sitting, Cuff Size: Normal)   Pulse 76   Temp 98.7 F (37.1 C) (Oral)   Ht 5\' 3"  (1.6 m)   Wt 219 lb 9.6 oz (99.6 kg)   SpO2 98%   BMI 38.90 kg/m    Physical Exam Constitutional:      Appearance: Normal appearance.  HENT:     Head: Normocephalic and atraumatic.     Right Ear: External ear normal.     Left Ear: External ear normal.     Nose: Nose normal.  Eyes:     Conjunctiva/sclera: Conjunctivae normal.  Cardiovascular:     Rate and Rhythm: Normal rate and regular rhythm.     Heart sounds: Normal heart sounds. No murmur heard.    No friction rub.  Pulmonary:     Effort: Pulmonary effort is normal.     Breath sounds: Normal breath sounds. No wheezing, rhonchi or rales.  Skin:    General: Skin is warm and dry.     Coloration: Skin is not jaundiced or pale.  Neurological:     Mental Status: She is alert.  Psychiatric:        Mood and Affect: Mood normal.        Behavior: Behavior normal.     No results found for any visits on 10/19/23.      Assessment & Plan:   Acute Cough - Still dealing with cough - Due to heart issues, DM products are not recommended - Increase Tessalon Perles to 200 mg 3 times daily - Provided with budesonide nebulizer solution to be taken alongside albuterol  nightly  Paperwork to be filled out -Provided paperwork-states that she needs this by August  I have spent greater than 20 minutes charting, educating, diagnosing and managing this patient for this visit.  Return if symptoms worsen or fail to improve.  Teryl Lucy Jaaziel Peatross, PA-C

## 2023-10-19 NOTE — Patient Instructions (Signed)
 It was nice to see you today!  As we discussed in clinic I recommend trying two tessalon perles (200mg ) up to three times daily.  If you have any problems before your next visit feel free to message me via MyChart (minor issues or questions) or call the office, otherwise you may reach out to schedule an office visit.  Thank you! Gerilyn Pilgrim Valmore Arabie, PA-C

## 2023-10-24 ENCOUNTER — Ambulatory Visit

## 2023-10-24 ENCOUNTER — Encounter: Payer: Self-pay | Admitting: Rheumatology

## 2023-10-24 ENCOUNTER — Encounter (HOSPITAL_COMMUNITY): Payer: 59 | Attending: Rheumatology | Admitting: Rheumatology

## 2023-10-24 ENCOUNTER — Ambulatory Visit: Attending: Rheumatology

## 2023-10-24 VITALS — BP 108/73 | HR 86 | Resp 14 | Ht 63.5 in | Wt 221.0 lb

## 2023-10-24 DIAGNOSIS — M170 Bilateral primary osteoarthritis of knee: Secondary | ICD-10-CM

## 2023-10-24 DIAGNOSIS — M79671 Pain in right foot: Secondary | ICD-10-CM

## 2023-10-24 DIAGNOSIS — J4540 Moderate persistent asthma, uncomplicated: Secondary | ICD-10-CM | POA: Insufficient documentation

## 2023-10-24 DIAGNOSIS — K5080 Crohn's disease of both small and large intestine without complications: Secondary | ICD-10-CM | POA: Insufficient documentation

## 2023-10-24 DIAGNOSIS — Z79899 Other long term (current) drug therapy: Secondary | ICD-10-CM | POA: Diagnosis not present

## 2023-10-24 DIAGNOSIS — M654 Radial styloid tenosynovitis [de Quervain]: Secondary | ICD-10-CM | POA: Insufficient documentation

## 2023-10-24 DIAGNOSIS — M79642 Pain in left hand: Secondary | ICD-10-CM | POA: Insufficient documentation

## 2023-10-24 DIAGNOSIS — Z903 Acquired absence of stomach [part of]: Secondary | ICD-10-CM | POA: Insufficient documentation

## 2023-10-24 DIAGNOSIS — M79641 Pain in right hand: Secondary | ICD-10-CM

## 2023-10-24 DIAGNOSIS — K224 Dyskinesia of esophagus: Secondary | ICD-10-CM | POA: Insufficient documentation

## 2023-10-24 DIAGNOSIS — K2100 Gastro-esophageal reflux disease with esophagitis, without bleeding: Secondary | ICD-10-CM | POA: Insufficient documentation

## 2023-10-24 DIAGNOSIS — R768 Other specified abnormal immunological findings in serum: Secondary | ICD-10-CM | POA: Insufficient documentation

## 2023-10-24 DIAGNOSIS — H04123 Dry eye syndrome of bilateral lacrimal glands: Secondary | ICD-10-CM | POA: Insufficient documentation

## 2023-10-24 DIAGNOSIS — Z96651 Presence of right artificial knee joint: Secondary | ICD-10-CM | POA: Diagnosis not present

## 2023-10-24 DIAGNOSIS — G4733 Obstructive sleep apnea (adult) (pediatric): Secondary | ICD-10-CM

## 2023-10-24 DIAGNOSIS — R5383 Other fatigue: Secondary | ICD-10-CM | POA: Insufficient documentation

## 2023-10-24 DIAGNOSIS — Z9109 Other allergy status, other than to drugs and biological substances: Secondary | ICD-10-CM | POA: Insufficient documentation

## 2023-10-24 DIAGNOSIS — I25118 Atherosclerotic heart disease of native coronary artery with other forms of angina pectoris: Secondary | ICD-10-CM | POA: Insufficient documentation

## 2023-10-24 DIAGNOSIS — M0609 Rheumatoid arthritis without rheumatoid factor, multiple sites: Secondary | ICD-10-CM | POA: Insufficient documentation

## 2023-10-24 DIAGNOSIS — M797 Fibromyalgia: Secondary | ICD-10-CM | POA: Insufficient documentation

## 2023-10-24 DIAGNOSIS — G43009 Migraine without aura, not intractable, without status migrainosus: Secondary | ICD-10-CM | POA: Insufficient documentation

## 2023-10-24 DIAGNOSIS — G4709 Other insomnia: Secondary | ICD-10-CM | POA: Insufficient documentation

## 2023-10-24 DIAGNOSIS — M1712 Unilateral primary osteoarthritis, left knee: Secondary | ICD-10-CM | POA: Insufficient documentation

## 2023-10-24 DIAGNOSIS — M79672 Pain in left foot: Secondary | ICD-10-CM

## 2023-10-24 DIAGNOSIS — Z86718 Personal history of other venous thrombosis and embolism: Secondary | ICD-10-CM | POA: Insufficient documentation

## 2023-10-24 DIAGNOSIS — I10 Essential (primary) hypertension: Secondary | ICD-10-CM | POA: Insufficient documentation

## 2023-10-24 DIAGNOSIS — R808 Other proteinuria: Secondary | ICD-10-CM | POA: Insufficient documentation

## 2023-10-24 DIAGNOSIS — Z87442 Personal history of urinary calculi: Secondary | ICD-10-CM | POA: Insufficient documentation

## 2023-10-24 DIAGNOSIS — K760 Fatty (change of) liver, not elsewhere classified: Secondary | ICD-10-CM | POA: Insufficient documentation

## 2023-10-24 DIAGNOSIS — M47816 Spondylosis without myelopathy or radiculopathy, lumbar region: Secondary | ICD-10-CM | POA: Insufficient documentation

## 2023-10-24 NOTE — Telephone Encounter (Signed)
 Casey [name] - please send discharge letter.  I called patient, patient said she never saw Dr. Dierdre Forth or his PA, patient said she saw a nephrologist within [facility] and Dr. Corliss Skains should be able to see those notes. [facility] does not have a nephrologist. Patient stated her hands showed OA previously on xrays., I offered to referral patient to Dr. Herma Carson in [facility], patient refused referral to anyone at [facility], patient said she would call [facility] to schedule appt and hung up on me.

## 2023-10-24 NOTE — Telephone Encounter (Signed)
 Per patient's message I made the changes in the office visit note: She probably saw Dr. Shawnee Knapp PA and not Dr. Dierdre Forth.  I also reviewed the note of the nephrologist.  There is no mention of lupus nephritis.  They mentioned that proteinuria could be due to previous history of diabetes.  There was  mention about proteinuria and hematuria since she was living in [place].  Please advise patient that  the x-rays obtained of bilateral hands did not show any changes suggestive of rheumatoid arthritis.  Will contact her once the lab results are available.  Patient was dissatisfied with the office visit today.  She told Adrienne in the x-ray room that she was dissatisfied with her experience during the office visit.  She also said that I did not review her records.  I have spent more than an hour reviewing her previous records.  I do not think we will have good doctor-patient relationship going forward.  I would recommend that she should establish with another rheumatologist or go back to [facility] rheumatology.  Please send a discharge letter to the patient.

## 2023-10-25 ENCOUNTER — Ambulatory Visit (INDEPENDENT_AMBULATORY_CARE_PROVIDER_SITE_OTHER): Payer: 59 | Admitting: Allergy

## 2023-10-25 ENCOUNTER — Encounter: Payer: Self-pay | Admitting: Allergy

## 2023-10-25 ENCOUNTER — Ambulatory Visit (INDEPENDENT_AMBULATORY_CARE_PROVIDER_SITE_OTHER)
Admission: RE | Admit: 2023-10-25 | Discharge: 2023-10-25 | Disposition: A | Discharge status: Home / Self Care | Source: Ambulatory Visit | Attending: Nephrology | Admitting: Nephrology

## 2023-10-25 VITALS — BP 114/60 | HR 80 | Temp 98.0°F | Resp 12 | Ht 63.5 in | Wt 220.0 lb

## 2023-10-25 DIAGNOSIS — Z91038 Other insect allergy status: Secondary | ICD-10-CM | POA: Diagnosis not present

## 2023-10-25 DIAGNOSIS — H1013 Acute atopic conjunctivitis, bilateral: Secondary | ICD-10-CM

## 2023-10-25 DIAGNOSIS — R8290 Unspecified abnormal findings in urine: Secondary | ICD-10-CM

## 2023-10-25 DIAGNOSIS — T7800XD Anaphylactic reaction due to unspecified food, subsequent encounter: Secondary | ICD-10-CM | POA: Diagnosis not present

## 2023-10-25 DIAGNOSIS — R809 Proteinuria, unspecified: Secondary | ICD-10-CM | POA: Diagnosis not present

## 2023-10-25 DIAGNOSIS — J302 Other seasonal allergic rhinitis: Secondary | ICD-10-CM

## 2023-10-25 DIAGNOSIS — J4530 Mild persistent asthma, uncomplicated: Secondary | ICD-10-CM

## 2023-10-25 DIAGNOSIS — J3089 Other allergic rhinitis: Secondary | ICD-10-CM | POA: Diagnosis not present

## 2023-10-25 NOTE — Patient Instructions (Addendum)
-   continue avoidance measures for grass pollen, weed pollen, tree pollen, outdoor mold, dust mite, cat, dog, mixed feathers, horse, cockroach and tobacco leaf.    - Continue with Singulair (montelukast) 10mg  daily - Xyzal daily 1-2 tabs a day For itchy/watery eyes if able you can try Pataday 1 drop each eye daily as needed for itchy/watery eyes. - Consider allergy shots as a means of long-term control.  Allergy shots "re-train" and "reset" the immune system to ignore environmental allergens and decrease the resulting immune response to those allergens (sneezing, itchy watery eyes, runny nose, nasal congestion, etc).   Allergy shots improve symptoms in 75-85% of patients.  She will go ahead and schedule a new start appointment to get started with traditional buildup.  - Continue avoidance of strawberry and stinging insects - Strawberry skin testing was positive.   - you are on the venom testing list and you will be notified when our test venom skin testing day is - Have access to self-injectable epinephrine (Epipen) 0.3mg  at all times - Follow emergency action plan in case of allergic reaction  - Continue as needed use of albuterol 1 vial via nebulizer - Continue further recommendations as per Dr Judeth Horn   - Continue direction and follow-up care with your infectious disease doctor - Continue direction and follow-up care with your rheumatologist for RA management  Follow-up in 6 months or sooner if needed

## 2023-10-25 NOTE — Progress Notes (Signed)
 Follow-up Note  RE: Casey [name] MRN: [id number] DOB: 01/16/1971 Date of Office Visit: 10/25/2023   History of present illness: Casey [name] is a 54 y.o. female presenting today for follow-up of allergic rhinitis with conjunctivitis, food allergy, hymenoptera allergy, asthma and history of recurrent esophageal candidiasis and rheumatoid arthritis.  She follows with infectious disease as well as pulmonologist Dr. Judeth Horn.  In regards to infectious prophy she has sting the recommendation currently is to treat the esophageal candidiasis if it returns and at this point does not need any type of suppressive therapy for this. Discussed the use of AI scribe software for clinical note transcription with the patient, who gave verbal consent to proceed.  She had the flu a couple of weeks ago, which was treated with Tamiflu, nebulizer treatments, and Zofran. She has mostly recovered, but a cough is still lingering. This is typical for her after a bad respiratory illness.. She uses a nebulizer at night nightly right now due to the flu, but otherwise uses it on a PRN basis. Prior to the flu, she barely needed to use the nebulizer.  She does not use any maintenance inhalers as she has history of esophageal candidiasis and also does not tolerate use.  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  With the recent flu she was prescribed Pulmicort via nebulizer that she had not tried the inhaled steroid medications in this form to see if she would tolerated however she discontinued Pulmicort after developing a rash and itchiness after a few breaths or if receiving the medication.  She has been experiencing epistaxis since having the flu as well. She is leery of using nasal sprays like Flonase due to headaches and irritation. She uses saline to help with  nasal moisture and plans to purchase a nasal lavage system.  She is currently using a small nebulizer intermittently to help keep the nosebleed.  Her current medications include Singulair (montelukast) and Xyzal, with Xyzal taken as two 5 mg tablets. She has not needed eye drops despite experiencing dry eyes.  She is ready to start allergy shots at this time.  She has access to an epinephrine device and she continues to avoid strawberries has not had any stings.  Review of systems: 10pt ROS negative unless noted above in HPI  Past medical/social/surgical/family history have been reviewed and are unchanged unless specifically indicated below.  No changes  Medication List: Current Outpatient Medications  Medication Sig Dispense Refill   acetaminophen (TYLENOL) 500 MG tablet Take 1,000 mg by mouth every 12 (twelve) hours as needed for mild pain or moderate pain.     albuterol (PROVENTIL) (2.5 MG/3ML) 0.083% nebulizer solution Take 3 mLs (2.5 mg total) by nebulization every 6 (six) hours as needed for wheezing or shortness of breath. 150 mL 1   amLODipine (NORVASC) 10 MG tablet Take 1 tablet (10 mg total) by mouth daily. 90 tablet 3   atorvastatin (LIPITOR) 80 MG tablet Take 1 tablet (80 mg total) by mouth daily. 30 tablet 2   B Complex-C (B-COMPLEX WITH VITAMIN C) tablet Take 1 tablet by mouth daily.     Cholecalciferol (VITAMIN D3) 50 MCG (2000 UT) TABS Take 1 tablet by mouth daily.     EPINEPHrine (EPIPEN 2-PAK) 0.3 mg/0.3 mL IJ SOAJ injection Inject 0.3 mg  into the muscle as needed for anaphylaxis. 2 each 2   famotidine (PEPCID) 40 MG tablet Take 1 tablet (40 mg total) by mouth daily. 30 tablet 11   Golimumab (SIMPONI ARIA IV) Inject 200 mg into the vein every 8 (eight) weeks.     levocetirizine (XYZAL) 5 MG tablet Take 5 mg by mouth. 2 tablets once day     lubiprostone (AMITIZA) 8 MCG capsule Take 8 mcg by mouth daily with breakfast.     mirabegron ER (MYRBETRIQ) 50 MG TB24 tablet Take 1  tablet (50 mg total) by mouth daily. 30 tablet 11   montelukast (SINGULAIR) 10 MG tablet Take 1 tablet (10 mg total) by mouth at bedtime. (Patient taking differently: Take 10 mg by mouth daily.) 30 tablet 11   nitroGLYCERIN (NITROSTAT) 0.4 MG SL tablet Place 1 tablet (0.4 mg total) under the tongue every 5 (five) minutes as needed for chest pain. 25 tablet 6   oxyCODONE (OXY IR/ROXICODONE) 5 MG immediate release tablet Take 5 mg by mouth as needed for moderate pain (pain score 4-6) or severe pain (pain score 7-10). Very rarely     ranolazine (RANEXA) 500 MG 12 hr tablet Take 500 mg by mouth 2 (two) times daily.     ticagrelor (BRILINTA) 90 MG TABS tablet Take 1 tablet (90 mg total) by mouth 2 (two) times daily. 60 tablet 5   benzonatate (TESSALON) 100 MG capsule Take 1 capsule (100 mg total) by mouth 2 (two) times daily as needed for cough. (Patient not taking: Reported on 10/25/2023) 20 capsule 0   esomeprazole (NEXIUM) 40 MG capsule Take 1 capsule (40 mg total) by mouth 2 (two) times daily before a meal. (Patient not taking: Reported on 10/25/2023) 60 capsule 11   No current facility-administered medications for this visit.     Known medication allergies: Allergies  Allergen Reactions   Albuterol Sulfate Shortness Of Breath    Other reaction(s): Other (See Comments)  can't breathe  Other reaction(s): Other (See Comments) can't breathe   Tape     Other reaction(s): Other (See Comments), Other (See Comments), Other (See Comments)  severe skin breakdown  Cast and Bandage Cover  severe skin breakdown  severe skin breakdown   Formoterol Other (See Comments)    Other reaction(s): Headaches  Other reaction(s): Headaches  Other reaction(s): Headaches  Headaches  Other reaction(s): Headaches Other reaction(s): Headaches    Other reaction(s): Headaches    Headaches   Hydrocodone Rash   Hydrocodone-Acetaminophen Rash   Nickel Rash and Other (See Comments)   Other Rash    cast and  bandage use  cast and bandage use  cast and bandage use  cast and bandage use    cast and bandage use cast and bandage use   Oxycodone-Acetaminophen Other (See Comments)    headache   Salmeterol Rash   Strawberry Extract Hives    Full body rash from strawberry   Advair Hfa [Fluticasone-Salmeterol]     unknown   Proventil Hfa [Albuterol]     unknown   Asa [Aspirin] Rash   Cleocin [Clindamycin] Rash   Clindamycin/Lincomycin Rash   Fludrocortisone Acetate Rash   Gabapentin Rash   Hydrochlorothiazide Rash   Imipramine Rash   Ipratropium Bromide Rash   Medroxyprogesterone Rash   Meloxicam Rash   Metformin And Related Rash   Nystatin Rash   Penicillins Rash   Pred Forte [Prednisolone] Rash   Prednisone Rash   Solu-Medrol [Methylprednisolone] Rash   Sulfa Antibiotics Rash  Tegaderm Ag Mesh [Silver] Rash   Toradol [Ketorolac Tromethamine] Rash   Tramadol Rash     Physical examination: Blood pressure 114/60, pulse 80, temperature 98 F (36.7 C), temperature source Temporal, resp. rate 12, height 5' 3.5" (1.613 m), weight 220 lb (99.8 kg), SpO2 98%.  General: Alert, interactive, in no acute distress. HEENT: PERRLA, TMs pearly gray, turbinates mildly edematous without discharge, post-pharynx non erythematous. Neck: Supple without lymphadenopathy. Lungs: Clear to auscultation without wheezing, rhonchi or rales. {no increased work of breathing. CV: Normal S1, S2 without murmurs. Abdomen: Nondistended, nontender. Skin: Warm and dry, without lesions or rashes. Extremities:  No clubbing, cyanosis or edema. Neuro:   Grossly intact.  Diagnositics/Labs:  Spirometry: Deferred as she states this always causes her to have an asthma attack.  Assessment and plan: Allergic rhinitis with conjunctivitis  - continue avoidance measures for grass pollen, weed pollen, tree pollen, outdoor mold, dust mite, cat, dog, mixed feathers, horse, cockroach and tobacco leaf.    - Continue with  Singulair (montelukast) 10mg  daily - Xyzal daily 1-2 tabs a day For itchy/watery eyes if able you can try Pataday 1 drop each eye daily as needed for itchy/watery eyes. - Consider allergy shots as a means of long-term control.  Allergy shots "re-train" and "reset" the immune system to ignore environmental allergens and decrease the resulting immune response to those allergens (sneezing, itchy watery eyes, runny nose, nasal congestion, etc).   Allergy shots improve symptoms in 75-85% of patients.  She will go ahead and schedule a new start appointment to get started with traditional buildup.  Food allergy Hymenoptera allergy - Continue avoidance of strawberry and stinging insects - Strawberry skin testing was positive.   - you are on the venom testing list and you will be notified when our test venom skin testing day is - Have access to self-injectable epinephrine (Epipen) 0.3mg  at all times - Follow emergency action plan in case of allergic reaction  Asthma - Continue as needed use of albuterol 1 vial via nebulizer - Continue further recommendations as per Dr Judeth Horn   History of esophageal candidiasis Rheumatoid arthritis - Continue direction and follow-up care with your infectious disease doctor - Continue direction and follow-up care with your rheumatologist for RA management Year probably Follow-up in 6 months or sooner if needed  I appreciate the opportunity to take part in Tallyn's care. Please do not hesitate to contact me with questions.  Sincerely,   Margo Aye, MD Allergy/Immunology [facility]

## 2023-10-26 DIAGNOSIS — J3081 Allergic rhinitis due to animal (cat) (dog) hair and dander: Secondary | ICD-10-CM | POA: Diagnosis not present

## 2023-10-26 NOTE — Progress Notes (Signed)
 Aeroallergen Immunotherapy  Ordering Provider: Dr. Margo Aye  Patient Details Name: Casey [name] MRN: [id number] Date of Birth: [birth year]  Order 2 of 2  Vial Label: Mold, mites, roach  0.2 ml (Volume)  1:20 Concentration -- Alternaria alternata 0.2 ml (Volume)  1:20 Concentration -- Drechslera spicifera 0.2 ml (Volume)  1:10 Concentration -- Mucor plumbeus 0.2 ml (Volume)  1:10 Concentration -- Fusarium moniliforme 0.3 ml (Volume)  1:20 Concentration -- Cockroach, German 0.5 ml (Volume)   AU Concentration -- Mite Mix (DF 5,000 & DP 5,000)   1.6  ml Extract Subtotal 3.4  ml Diluent 5.0  ml Maintenance Total  Schedule:  B Blue Vial (1:100,000): Schedule B (6 doses) Yellow Vial (1:10,000): Schedule B (6 doses) Green Vial (1:1,000): Schedule B (6 doses) Red Vial (1:100): Schedule A (14 doses)  Special Instructions: After completion of the first Red Vial, please space to every two weeks. After completion of the second Red Vial, please space to every 4 weeks. Ok to up dose new vials at 0.25mL --> 0.3 mL --> 0.5 mL.  Ok to come twice weekly, if desired, as long as there is 48 hours between injections.       Priority and Order

## 2023-10-26 NOTE — Progress Notes (Signed)
 Aeroallergen Immunotherapy  Ordering Provider: Dr. Margo Aye  Patient Details Name: Casey [name] MRN: [id number] Date of Birth: November 29, 1970  Order 1 of 2  Vial Label: Pollen, pets  0.3 ml (Volume)  BAU Concentration -- 7 Grass Mix* 100,000 ([address] [place], [place], [place], [place], [place], Sweet Vernal, Timothy) 0.3 ml (Volume)  BAU Concentration -- [place] 10,000 0.2 ml (Volume)  1:20 Concentration -- Cocklebur 0.2 ml (Volume)  1:10 Concentration -- Plantain English 0.2 ml (Volume)  1:10 Concentration -- Sheep Sorrell* 0.2 ml (Volume)  1:20 Concentration -- Guernsey Thistle 0.2 ml (Volume)  1:80 Concentration -- Dogfennel 0.5 ml (Volume)  1:20 Concentration -- Eastern 10 Tree Mix (also Sweet Gum) 0.2 ml (Volume)  1:20 Concentration -- Red Mulberry 0.5 ml (Volume)  1:10 Concentration -- Cat Hair 0.5 ml (Volume)  1:10 Concentration -- Dog Epithelia   3.3  ml Extract Subtotal 1.7  ml Diluent 5.0  ml Maintenance Total  Schedule:  B Blue Vial (1:100,000): Schedule B (6 doses) Yellow Vial (1:10,000): Schedule B (6 doses) Green Vial (1:1,000): Schedule B (6 doses) Red Vial (1:100): Schedule A (14 doses)  Special Instructions: After completion of the first Red Vial, please space to every two weeks. After completion of the second Red Vial, please space to every 4 weeks. Ok to up dose new vials at 0.66mL --> 0.3 mL --> 0.5 mL.  Ok to come twice weekly, if desired, as long as there is 48 hours between injections.

## 2023-10-26 NOTE — Addendum Note (Signed)
 Addended by: Lorrin Mais on: 10/26/2023 09:48 AM   Modules accepted: Orders

## 2023-10-26 NOTE — Progress Notes (Signed)
 VIAL SET POLLEN-PET MADE 10-26-23. EXP 10-15-24

## 2023-10-27 DIAGNOSIS — J3089 Other allergic rhinitis: Secondary | ICD-10-CM | POA: Diagnosis not present

## 2023-10-27 LAB — RNP ANTIBODY: Ribonucleic Protein(ENA) Antibody, IgG: <1 AI

## 2023-10-27 LAB — SJOGRENS SYNDROME-B EXTRACTABLE NUCLEAR ANTIBODY: SSB (La) (ENA) Antibody, IgG: <1 AI

## 2023-10-27 LAB — C3 AND C4
C3 Complement: 144 mg/dL (ref 83–193)
C4 Complement: 27 mg/dL (ref 15–57)

## 2023-10-27 LAB — SEDIMENTATION RATE: Sed Rate: 46 mm/h — ABNORMAL HIGH (ref 0–30)

## 2023-10-27 LAB — SJOGRENS SYNDROME-A EXTRACTABLE NUCLEAR ANTIBODY: SSA (Ro) (ENA) Antibody, IgG: <1 AI

## 2023-10-27 LAB — ANTI-NUCLEAR AB-TITER (ANA TITER): ANA Titer 1: 1:320 {titer} — ABNORMAL HIGH

## 2023-10-27 LAB — PROTEIN ELECTROPHORESIS, SERUM, WITH REFLEX
Albumin ELP: 3.9 g/dL (ref 3.8–4.8)
Alpha 1: 0.3 g/dL (ref 0.2–0.3)
Alpha 2: 0.8 g/dL (ref 0.5–0.9)
Beta 2: 0.5 g/dL (ref 0.2–0.5)
Beta Globulin: 0.6 g/dL (ref 0.4–0.6)
Gamma Globulin: 1.8 g/dL — ABNORMAL HIGH (ref 0.8–1.7)
Total Protein: 7.9 g/dL (ref 6.1–8.1)

## 2023-10-27 LAB — ANTI-SCLERODERMA ANTIBODY: Scleroderma (Scl-70) (ENA) Antibody, IgG: <1 AI

## 2023-10-27 LAB — RHEUMATOID FACTOR: Rheumatoid fact SerPl-aCnc: <10 [IU]/mL (ref <14)

## 2023-10-27 LAB — BETA-2 GLYCOPROTEIN ANTIBODIES
Beta-2 Glyco 1 IgA: <2 U/mL (ref <20.0)
Beta-2 Glyco 1 IgM: <2 U/mL (ref <20.0)
Beta-2 Glyco I IgG: <2 U/mL (ref <20.0)

## 2023-10-27 LAB — QUANTIFERON-TB GOLD PLUS
Mitogen-NIL: >10 [IU]/mL
NIL: 0.04 [IU]/mL
QuantiFERON-TB Gold Plus: NEGATIVE
TB1-NIL: <0 [IU]/mL
TB2-NIL: <0 [IU]/mL

## 2023-10-27 LAB — CARDIOLIPIN ANTIBODIES, IGG, IGM, IGA
Anticardiolipin IgA: <2 [APL'U]/mL (ref <20.0)
Anticardiolipin IgG: <2 [GPL'U]/mL (ref <20.0)
Anticardiolipin IgM: <2 [MPL'U]/mL (ref <20.0)

## 2023-10-27 LAB — IGG, IGA, IGM
IgG (Immunoglobin G), Serum: 1963 mg/dL — ABNORMAL HIGH (ref 600–1640)
IgM, Serum: 118 mg/dL (ref 50–300)
Immunoglobulin A: 421 mg/dL — ABNORMAL HIGH (ref 47–310)

## 2023-10-27 LAB — ANTI-SMITH ANTIBODY: ENA SM Ab Ser-aCnc: <1 AI

## 2023-10-27 LAB — CYCLIC CITRUL PEPTIDE ANTIBODY, IGG: Cyclic Citrullin Peptide Ab: <16 U

## 2023-10-27 LAB — ANA: Anti Nuclear Antibody (ANA): POSITIVE — AB

## 2023-10-27 LAB — ANTI-DNA ANTIBODY, DOUBLE-STRANDED: ds DNA Ab: 1 [IU]/mL

## 2023-10-27 NOTE — Progress Notes (Signed)
 VIAL SET MOLD-DM-CR MADE 10-27-23. EXP 10-26-24

## 2023-10-27 NOTE — Progress Notes (Signed)
 Sedimentation rate is elevated at 46 and stable.  ANA is positive at 1: 320 NH.  ENA panel which includes more specific test for lupus, Sjogren's, scleroderma and mixed connective tissue disease is negative.  Complements normal.  Rheumatoid factor and anti-CCP (the labs for rheumatoid arthritis) negative, SPEP normal, IgA and IgG are mildly elevated and nonspecific, TB Gold negative, beta-2 GP 1 and anticardiolipin antibodies (which can cause increased risk of blood clotting) were negative.  Please notify patient and forward results to her rheumatologist.

## 2023-10-28 ENCOUNTER — Encounter: Payer: Self-pay | Admitting: *Deleted

## 2023-10-28 ENCOUNTER — Ambulatory Visit: Attending: Cardiology | Admitting: Cardiology

## 2023-10-28 ENCOUNTER — Encounter: Payer: Self-pay | Admitting: Cardiology

## 2023-10-28 ENCOUNTER — Ambulatory Visit (INDEPENDENT_AMBULATORY_CARE_PROVIDER_SITE_OTHER)

## 2023-10-28 VITALS — BP 130/88 | HR 87 | Resp 16 | Ht 63.0 in | Wt 222.4 lb

## 2023-10-28 DIAGNOSIS — I25118 Atherosclerotic heart disease of native coronary artery with other forms of angina pectoris: Secondary | ICD-10-CM | POA: Diagnosis not present

## 2023-10-28 DIAGNOSIS — I10 Essential (primary) hypertension: Secondary | ICD-10-CM

## 2023-10-28 DIAGNOSIS — E66812 Obesity, class 2: Secondary | ICD-10-CM

## 2023-10-28 DIAGNOSIS — R002 Palpitations: Secondary | ICD-10-CM | POA: Diagnosis not present

## 2023-10-28 DIAGNOSIS — Z903 Acquired absence of stomach [part of]: Secondary | ICD-10-CM

## 2023-10-28 DIAGNOSIS — E7800 Pure hypercholesterolemia, unspecified: Secondary | ICD-10-CM

## 2023-10-28 DIAGNOSIS — I2510 Atherosclerotic heart disease of native coronary artery without angina pectoris: Secondary | ICD-10-CM | POA: Diagnosis not present

## 2023-10-28 DIAGNOSIS — Z6839 Body mass index (BMI) 39.0-39.9, adult: Secondary | ICD-10-CM

## 2023-10-28 MED ORDER — NITROGLYCERIN 0.4 MG SL SUBL: 0.4000 mg | SUBLINGUAL_TABLET | SUBLINGUAL | 0 refills | Status: DC | PRN

## 2023-10-28 NOTE — Patient Instructions (Addendum)
 Medication Instructions:  Your physician recommends that you continue on your current medications as directed. Please refer to the Current Medication list given to you today.  Refill for nitroglycerin has been sent to your pharmacy.  *If you need a refill on your cardiac medications before your next appointment, please call your pharmacy*  Lab Work: None ordered today. If you have labs (blood work) drawn today and your tests are completely normal, you will receive your results only by: MyChart Message (if you have MyChart) OR A paper copy in the mail If you have any lab test that is abnormal or we need to change your treatment, we will call you to review the results.  Testing/Procedures: Your physician has requested that you wear a Zio heart monitor for 7 days. This will be mailed to your home with instructions on how to apply the monitor and how to return it when finished. Please allow 2 weeks after returning the heart monitor before our office calls you with the results.   Follow-Up: At [facility], you and your health needs are our priority.  As part of our continuing mission to provide you with exceptional heart care, we have created designated Provider Care Teams.  These Care Teams include your primary Cardiologist (physician) and Advanced Practice Providers (APPs -  Physician Assistants and Nurse Practitioners) who all work together to provide you with the care you need, when you need it.  We recommend signing up for the patient portal called "MyChart".  Sign up information is provided on this After Visit Summary.  MyChart is used to connect with patients for Virtual Visits (Telemedicine).  Patients are able to view lab/test results, encounter notes, upcoming appointments, etc.  Non-urgent messages can be sent to your provider as well.   To learn more about what you can do with MyChart, go to [email or web address].    Your next appointment:   6 month(s)  The format for your next  appointment:   In Person  Provider:   Tessa Lerner, DO {  Other Instructions You have been referred to PharmD for initiation of a PCSK9 for lipid management.    You have been referred to cardiac rehab.   ZIO XT- Long Term Monitor Instructions     Your physician has requested you wear a ZIO patch monitor for 7 days.  This is a single patch monitor. [employer] supplies one patch monitor per enrollment. Additional  stickers are not available. Please do not apply patch if you will be having a Nuclear Stress Test,  Echocardiogram, Cardiac CT, MRI, or Chest Xray during the period you would be wearing the  monitor. The patch cannot be worn during these tests. You cannot remove and re-apply the  ZIO XT patch monitor.  Your ZIO patch monitor will be mailed 3 day USPS to your address on file. It may take 3-5 days  to receive your monitor after you have been enrolled.  Once you have received your monitor, please review the enclosed instructions. Your monitor  has already been registered assigning a specific monitor serial # to you.     Billing and Patient Assistance Program Information     We have supplied [employer] with any of your insurance information on file for billing purposes.  [employer] offers a sliding scale Patient Assistance Program for patients that do not have  insurance, or whose insurance does not completely cover the cost of the ZIO monitor.  You must apply for the Patient Assistance Program to qualify for  this discounted rate.  To apply, please call [employer] at [phone], select option 4, select option 2, ask to apply for  Patient Assistance Program. Meredeth Ide will ask your household income, and how many people  are in your household. They will quote your out-of-pocket cost based on that information.  [employer] will also be able to set up a 25-month, interest-free payment plan if needed.     Applying the monitor     Shave hair from upper left chest.  Hold abrader disc by orange tab.  Rub abrader in 40 strokes over the upper left chest as  indicated in your monitor instructions.  Clean area with 4 enclosed alcohol pads. Let dry.  Apply patch as indicated in monitor instructions. Patch will be placed under collarbone on left  side of chest with arrow pointing upward.  Rub patch adhesive wings for 2 minutes. Remove white label marked "1". Remove the white  label marked "2". Rub patch adhesive wings for 2 additional minutes.  While looking in a mirror, press and release button in center of patch. A small green light will  flash 3-4 times. This will be your only indicator that the monitor has been turned on.  Do not shower for the first 24 hours. You may shower after the first 24 hours.  Press the button if you feel a symptom. You will hear a small click. Record Date, Time and  Symptom in the Patient Logbook.  When you are ready to remove the patch, follow instructions on the last 2 pages of Patient  Logbook. Stick patch monitor onto the last page of Patient Logbook.  Place Patient Logbook in the blue and white box. Use locking tab on box and tape box closed  securely. The blue and white box has prepaid postage on it. Please place it in the mailbox as  soon as possible. Your physician should have your test results approximately 7 days after the  monitor has been mailed back to [facility].  Call [facility] Customer Care at [phone] if you have questions regarding  your ZIO XT patch monitor. Call them immediately if you see an orange light blinking on your  monitor.  If your monitor falls off in less than 4 days, contact our Monitor department at [phone].  If your monitor becomes loose or falls off after 4 days call [employer] at [phone] for  suggestions on securing your monitor.       1st Floor: - Lobby - Registration  - Pharmacy  - Lab - Cafe  2nd Floor: - PV Lab - Diagnostic Testing (echo, CT, nuclear med)  3rd Floor: - Vacant  4th  Floor: - TCTS (cardiothoracic surgery) - AFib Clinic - Structural Heart Clinic - Vascular Surgery  - Vascular Ultrasound  5th Floor: - HeartCare Cardiology (general and EP) - Clinical Pharmacy for coumadin, hypertension, lipid, weight-loss medications, and med management appointments    Valet parking services will be available as well.

## 2023-10-28 NOTE — Telephone Encounter (Signed)
 Dismissal letter mailed to patient.

## 2023-10-28 NOTE — Progress Notes (Unsigned)
 Applied a 7 day [employer] long term monitor to patient in the office with sensitive patches Mini EL [phone]

## 2023-10-28 NOTE — Progress Notes (Signed)
 Cardiology Office Note:  .   Date:  10/28/2023  ID:  Casey [name], DOB 05/09/1971, MRN [id number] PCP:  Ranae Plumber [facility] [place]  Former Cardiology Providers: Dr. [facility] Providers Cardiologist:  Tessa Lerner, DO , [facility] (established care 10/28/23) Electrophysiologist:  None  Click to update primary MD,subspecialty MD or APP then REFRESH:1}    Chief Complaint  Patient presents with   Palpitations        Establish Care    History of Present Illness: .   Casey [name] is a 53 y.o. 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   Formally under the care of Dr. Bing Matter who last saw Casey [name] back in 09/11/2023. I am seeing her for the first time to re-establishing care.   CAD: In November 2024 patient presented to the hospital with a chief complaint of chest pain.  Her workup eventually led up to left heart catheterization and was noted to have obstructive disease and underwent coronary intervention.  Since then the focus has been on to improve her modifiable cardiovascular risk factors for secondary prevention.  Clinically she denies anginal chest pain at this time.  The patient is allergic to aspirin and therefore currently on Brilinta.  Patient states that she started cardiac rehab but never completed it.  Overall physical capacity is limited rheumatoid arthritis flareup.  Patient is wanting to resume cardiac rehab status post PCI in November 2024.  Palpitations: Occurs weekly. Duration: Seconds. More noticeable with effort related activities. Improves with resting. Associated with shortness of breath and the sensation  of being winded. Factors to consider: History of thyroid disease: hx of goiter, hx of thyroid biopsy  History of anemia: none Coffee consumption: none Caffeinated beverages/sodas: 2-3 severing / day  Energy drinks: None Alcohol use: rare Stimulants: none Illicit drugs: none Weight loss supplements: none New over-the-counter medications: none Herbal supplements: None  Review of Systems: .   Review of Systems  Constitutional: Positive for malaise/fatigue.  Cardiovascular:  Positive for dyspnea on exertion and palpitations. Negative for chest pain, claudication, irregular heartbeat, leg swelling, near-syncope, orthopnea, paroxysmal nocturnal dyspnea and syncope.  Respiratory:  Negative for shortness of breath.   Hematologic/Lymphatic: Negative for bleeding problem.    Studies Reviewed:   EKG: EKG Interpretation Date/Time:  Friday October 28 2023 14:38:26 EDT Ventricular Rate:  81 PR Interval:  150 QRS Duration:  88 QT Interval:  404 QTC Calculation: 469 R Axis:   -18  Text Interpretation: Normal sinus rhythm Normal ECG When compared with ECG of 16-Oct-2023 15:24, No significant change was found Confirmed by Tessa Lerner [phone]) on 10/28/2023 2:54:32 PM  Echocardiogram: 09/05/2023  1. Left ventricular ejection fraction, by estimation, is 60 to 65%. The left ventricle has normal function. The left ventricle has no regional  wall motion abnormalities. There is mild left ventricular hypertrophy. Left ventricular diastolic parameters are consistent with Grade II diastolic dysfunction (pseudonormalization). The average left ventricular global longitudinal strain is -16.3 %. The global longitudinal strain is abnormal.   2. Right ventricular systolic function is normal. The right ventricular  size is normal. There is normal pulmonary artery systolic pressure.   3. There is no evidence  of cardiac tamponade.   4. The mitral valve is normal in structure. No evidence of mitral valve  regurgitation. No evidence of mitral stenosis.   5. The aortic valve is normal in structure. Aortic valve regurgitation is not visualized. No aortic stenosis is present.   6. The inferior vena cava is normal in size with greater than 50% respiratory variability, suggesting right atrial pressure of 3 mmHg.   Left Heart Catheterization 08/01/23: Hemodynamic data: LV: 139/-2, EDP 15 mmHg.  Ao 127/78, mean 99 mmHg.  No pressure gradient across the aortic valve.   Angiographic data: LV: Normal LV systolic function, EF 55 to 60%.  No regional wall motion abnormality in the RAO projection. LM: Large-caliber vessel.  It is smooth and normal. LAD: Gives origin to large D1.  Just after the D1, previously placed Synergy 3.0 x 20 mm stent on 08/30/2022 is widely patent.  There is mild disease in the mid to distal LAD and apical LAD. LCx: Gives origin to a very small OM1, large OM 2 and continues in the AV groove.  There is mild disease noted in the proximal segment. RCA: Large-caliber vessel nondominant vessel.  Gives origin to large PDA and a larger PL branch which has ostial 40% stenosis.  RCA itself is mildly diffusely diseased.  RADIOLOGY: NA  Risk Assessment/Calculations:   NA   Labs:       Latest Ref Rng & Units 10/16/2023    3:21 PM 10/13/2023   10:50 AM 09/10/2023    5:30 PM  CBC  WBC 4.0 - 10.5 K/uL 6.7  9.0  12.5   Hemoglobin 12.0 - 15.0 g/dL 16.1  09.6  04.5   Hematocrit 36.0 - 46.0 % 37.3  33.6  39.6   Platelets 150 - 400 K/uL 313  289  357        Latest Ref Rng & Units 10/16/2023    3:21 PM 10/13/2023   10:50 AM 09/29/2023    2:05 PM  BMP  Glucose 70 - 99 mg/dL 409  811    BUN 6 - 20 mg/dL 5  7    Creatinine 9.14 - 1.00 mg/dL 7.82  9.56    Sodium 213 - 145 mmol/L 137  137    Potassium 3.5 - 5.1 mmol/L 3.3  3.0  4.2   Chloride 98 - 111 mmol/L 104  103    CO2 22 - 32 mmol/L 25  24    Calcium 8.9 - 10.3 mg/dL 8.9  9.1        Latest Ref Rng & Units 10/24/2023   10:14 AM 10/16/2023     3:21 PM 10/13/2023   10:50 AM  CMP  Glucose 70 - 99 mg/dL  086  578   BUN 6 - 20 mg/dL  5  7   Creatinine 4.69 - 1.00 mg/dL  6.29  5.28   Sodium 413 - 145 mmol/L  137  137   Potassium 3.5 - 5.1 mmol/L  3.3  3.0   Chloride 98 - 111 mmol/L  104  103   CO2 22 - 32 mmol/L  25  24   Calcium 8.9 - 10.3 mg/dL  8.9  9.1   Total Protein 6.1 - 8.1 g/dL 7.9   7.2   Total Bilirubin 0.0 - 1.2 mg/dL   0.9   Alkaline Phos 38 - 126 U/L   84   AST 15 - 41 U/L   28   ALT 0 - 44 U/L  18     Lab Results  Component Value Date   CHOL 249 (H) 09/06/2023   HDL 73 09/06/2023   LDLCALC 140 (H) 09/06/2023   TRIG 205 (H) 09/06/2023   CHOLHDL 3.4 09/06/2023   Recent Labs    07/02/23 0321  LIPOA 58.1*   No components found for: "NTPROBNP" Recent Labs    09/19/23 1006  PROBNP 153   Recent Labs    11/30/22 1000  TSH 0.52    Physical Exam:    Today's Vitals   10/28/23 1434  BP: 130/88  Pulse: 87  Resp: 16  SpO2: 97%  Weight: 222 lb 6.4 oz (100.9 kg)  Height: 5\' 3"  (1.6 m)   Body mass index is 39.4 kg/m. Wt Readings from Last 3 Encounters:  10/28/23 222 lb 6.4 oz (100.9 kg)  10/25/23 220 lb (99.8 kg)  10/24/23 221 lb (100.2 kg)    Physical Exam  Constitutional: No distress.  hemodynamically stable  HENT:  Poor dentition  Neck: No JVD present.  Cardiovascular: Normal rate, regular rhythm, S1 normal and S2 normal. Exam reveals no gallop, no S3 and no S4.  No murmur heard. Pulmonary/Chest: Effort normal and breath sounds normal. No stridor. She has no wheezes. She has no rales.  Musculoskeletal:        General: No edema.     Cervical back: Neck supple.  Skin: Skin is warm.     Impression & Recommendation(s):  Impression:   ICD-10-CM   1. Coronary artery disease involving native coronary artery of native heart without angina pectoris  I25.10 EKG 12-Lead    AMB referral to cardiac rehabilitation    2. Palpitations  R00.2 LONG TERM MONITOR (3-14 DAYS)    3. Essential  hypertension  I10     4. Pure hypercholesterolemia  E78.00 AMB Referral to [facility]    5. H/O gastric sleeve  Z90.3     6. Class 2 severe obesity due to excess calories with serious comorbidity and body mass index (BMI) of 39.0 to 39.9 in adult [facility])  H84.696    E66.01    Z68.39        Recommendation(s):  Coronary artery disease involving native coronary artery of native heart without angina pectoris Status post PCI to the proximal/mid LAD, 07/01/2023 Recommended Brilinta for at least 6 months and thereafter transition to Plavix. Patient is allergic to aspirin 81 mg p.o. daily. Denies anginal pectoralis. EKG is nonischemic. Currently on maximally tolerated dose of statin therapy, lipids are not well-controlled.  Will initiate PCSK9 inhibitor. Patient interested in completing her cardiac rehab-referral placed  Palpitations Chronic and longstanding. More noticeable. Reemphasized importance of keeping herself well-hydrated, 3 balanced heart healthy meals, changing positions slowly Check TSH and BMP In the past cardiac monitors have not been performed as she is allergic to adhesives which causes itching. Spoke to our monitor/device nurses and patient will be set up with a [employer] long-term monitor. Further recommendations to follow. No prior history of being on AV nodal blocking agents. Extensive list of allergies which will need to be reviewed prior to initiating medical therapy  Essential hypertension Office blood pressures are acceptable. Continue amlodipine 10 mg p.o. daily Reemphasized importance of low-salt diet.  Pure hypercholesterolemia Currently on Lipitor 80 mg p.o. daily. Lipids as of January 2025 illustrate LDL of 140 mmHg despite being on high intensity statin therapy. Given her coronary interventions recommend a goal LDL <55 mg/dL. Will refer to Pharm.D. for PCSK9 initiation  H/O gastric  sleeve Class 2 severe obesity due to excess calories with  serious comorbidity and body mass index (BMI) of 39.0 to 39.9 in adult [facility]) Body mass index is 39.4 kg/m. I reviewed with her importance of diet, regular physical activity/exercise, weight loss.   Patient is educated on the importance of increasing physical activity gradually as tolerated with a goal of moderate intensity exercise for 30 minutes a day 5 days a week.  Recommend starting slowly and titrating up as tolerated.  Will refer to cardiac rehab as discussed above. Recommend that she discusses with PCP with regards to seeing health and wellness center or weight loss management program for patient education and further guidance to help facilitate weight loss.  Orders Placed:  Orders Placed This Encounter  Procedures   AMB Referral to [facility] Pharm-D    Referral Priority:   Routine    Referral Type:   Consultation    Referral Reason:   Specialty Services Required    Number of Visits Requested:   1   AMB referral to cardiac rehabilitation    Referral Priority:   Routine    Referral Type:   Consultation    Number of Visits Requested:   1   LONG TERM MONITOR (3-14 DAYS)    Standing Status:   Future    Number of Occurrences:   1    Expected Date:   10/28/2023    Expiration Date:   10/27/2024    Where should this test be performed?:   [facility]    Does the patient have an implanted cardiac device?:   No    Prescribed days of wear:   7    Type of enrollment:   Home Enrollment    Vendor::   [employer]   EKG 12-Lead     Final Medication List:    Meds ordered this encounter  Medications   nitroGLYCERIN (NITROSTAT) 0.4 MG SL tablet    Sig: Place 1 tablet (0.4 mg total) under the tongue every 5 (five) minutes as needed for chest pain.    Dispense:  30 tablet    Refill:  0    Medications Discontinued During This Encounter  Medication Reason   benzonatate (TESSALON) 100 MG capsule Completed Course   ranolazine (RANEXA) 500 MG 12 hr tablet Completed Course   nitroGLYCERIN  (NITROSTAT) 0.4 MG SL tablet Reorder     Current Outpatient Medications:    acetaminophen (TYLENOL) 500 MG tablet, Take 1,000 mg by mouth every 12 (twelve) hours as needed for mild pain or moderate pain., Disp: , Rfl:    albuterol (PROVENTIL) (2.5 MG/3ML) 0.083% nebulizer solution, Take 3 mLs (2.5 mg total) by nebulization every 6 (six) hours as needed for wheezing or shortness of breath., Disp: 150 mL, Rfl: 1   amLODipine (NORVASC) 10 MG tablet, Take 1 tablet (10 mg total) by mouth daily., Disp: 90 tablet, Rfl: 3   atorvastatin (LIPITOR) 80 MG tablet, Take 1 tablet (80 mg total) by mouth daily., Disp: 30 tablet, Rfl: 2   B Complex-C (B-COMPLEX WITH VITAMIN C) tablet, Take 1 tablet by mouth daily., Disp: , Rfl:    Cholecalciferol (VITAMIN D3) 50 MCG (2000 UT) TABS, Take 1 tablet by mouth daily., Disp: , Rfl:    EPINEPHrine (EPIPEN 2-PAK) 0.3 mg/0.3 mL IJ SOAJ injection, Inject 0.3 mg into the muscle as needed for anaphylaxis., Disp: 2 each, Rfl: 2   esomeprazole (NEXIUM) 40 MG capsule, Take 1 capsule (40 mg total) by mouth 2 (two)  times daily before a meal., Disp: 60 capsule, Rfl: 11   famotidine (PEPCID) 40 MG tablet, Take 1 tablet (40 mg total) by mouth daily., Disp: 30 tablet, Rfl: 11   Golimumab (SIMPONI ARIA IV), Inject 200 mg into the vein every 8 (eight) weeks., Disp: , Rfl:    levocetirizine (XYZAL) 5 MG tablet, Take 5 mg by mouth. 2 tablets once day, Disp: , Rfl:    lubiprostone (AMITIZA) 8 MCG capsule, Take 8 mcg by mouth daily with breakfast., Disp: , Rfl:    mirabegron ER (MYRBETRIQ) 50 MG TB24 tablet, Take 1 tablet (50 mg total) by mouth daily., Disp: 30 tablet, Rfl: 11   montelukast (SINGULAIR) 10 MG tablet, Take 1 tablet (10 mg total) by mouth at bedtime. (Patient taking differently: Take 10 mg by mouth daily.), Disp: 30 tablet, Rfl: 11   oxyCODONE (OXY IR/ROXICODONE) 5 MG immediate release tablet, Take 5 mg by mouth as needed for moderate pain (pain score 4-6) or severe pain (pain  score 7-10). Very rarely, Disp: , Rfl:    ticagrelor (BRILINTA) 90 MG TABS tablet, Take 1 tablet (90 mg total) by mouth 2 (two) times daily., Disp: 60 tablet, Rfl: 5   nitroGLYCERIN (NITROSTAT) 0.4 MG SL tablet, Place 1 tablet (0.4 mg total) under the tongue every 5 (five) minutes as needed for chest pain., Disp: 30 tablet, Rfl: 0  Consent:   NA  Disposition:   6 months follow-up  Total time spent: 54 minutes. New patient to the practice, transferring from other provider Reviewed progress note 2//2025 by her former cardiologist. Reviewed and independently reviewed EKG. Independently reviewed echocardiogram from 09/05/2023. Reviewed heart catheterization report from 07/01/2023. Discussed management of her active problem list and management of at least 2 chronic comorbid conditions. Prescription drug management. Ordered additional labs for diagnostic workup.  Her questions and concerns were addressed to her satisfaction. She voices understanding of the recommendations provided during this encounter.    Signed, Tessa Lerner, DO, [facility] [place]  [facility]  [address] [place], [place] [address] 10/28/2023 7:07 PM

## 2023-10-31 ENCOUNTER — Ambulatory Visit (HOSPITAL_BASED_OUTPATIENT_CLINIC_OR_DEPARTMENT_OTHER)
Admission: RE | Admit: 2023-10-31 | Discharge: 2023-10-31 | Disposition: A | Discharge status: Home / Self Care | Source: Ambulatory Visit | Attending: Family Medicine | Admitting: Family Medicine

## 2023-10-31 ENCOUNTER — Encounter (HOSPITAL_BASED_OUTPATIENT_CLINIC_OR_DEPARTMENT_OTHER): Payer: Self-pay

## 2023-10-31 ENCOUNTER — Telehealth: Payer: Self-pay | Admitting: Cardiology

## 2023-10-31 VITALS — BP 130/79 | HR 76 | Temp 97.9°F | Resp 20

## 2023-10-31 DIAGNOSIS — M069 Rheumatoid arthritis, unspecified: Secondary | ICD-10-CM

## 2023-10-31 MED ORDER — TRIAMCINOLONE ACETONIDE 40 MG/ML IJ SUSP
40.0000 mg | Freq: Once | INTRAMUSCULAR | Status: AC
  Administered 2023-10-31: 40 mg via INTRAMUSCULAR

## 2023-10-31 NOTE — Telephone Encounter (Signed)
 I will leave 5 Hydrocolloid strips at the front desk at our [facility] office for you to pick up.

## 2023-10-31 NOTE — ED Triage Notes (Signed)
 Joint pain "all over". Symptoms about 1 week. To have next infusion on April 1st.

## 2023-10-31 NOTE — Telephone Encounter (Signed)
 Pt called in stating her heart monitor adhesive is falling off due to allergy. She asked if she should take it off early, she states she was supposed to wear for 7 days. Please advise.

## 2023-10-31 NOTE — Telephone Encounter (Signed)
 Please direct the question to the staff who do monitors to see if she can get come sticker until the company sends her more.   Casey Stilley [place], DO, [facility]

## 2023-10-31 NOTE — Telephone Encounter (Signed)
 Patient reports that she started wearing her heart monitor last Friday. She states that they gave her four stickers and she is currently on her fourth one. She states that they start to bubble up and then fall off. She states that the monitor company will send her more but she wont get them for another couple of days. She would like to know if she can get any more stickers from [place] and if not should she just go ahead and send the monitor back.

## 2023-10-31 NOTE — ED Provider Notes (Signed)
 [name]    CSN: [id number] Arrival date & time: 10/31/23  1103      History   Chief Complaint Chief Complaint  Patient presents with   Joint Pain    I have RA and am late getting  my infusion. my doctor  Alene Mires at [facility] has oked my getting steroid shot when in this much pain as I can't take oral steriods - Entered by patient    HPI Casey [name] is a 53 y.o. female.   53 year old female presents today for RA flare.  Due to being sick last month she missed her infusion.  She is scheduled for infusion on April 1.  She is hurting currently multiple joint sites.  No other associated symptoms.  No fever or chills.     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 Atopic dermatitis Formatting of this note might be different from the original. Formatting of this note might be different from the original. Atopic dermatitis Formatting of this note might be different from the original. Formatting of this note might be different from the original. Formatting of this note might be different from the or   Cataract 05/06/2015   Contusion of left wrist 07/08/2021   Coronary artery disease s/p PCI/DES to LAD 07/01/2023 12/28/2022   Crohn's disease (HCC) 04/21/2015   De Quervain's tenosynovitis, left 07/31/2021   Diabetes mellitus without complication (HCC)    Diabetes type 2, controlled (HCC) 04/21/2015   Dry eyes 05/06/2015   DVT (deep venous thrombosis) (HCC)    Dysfunction of both eustachian tubes 08/31/2012   Encounter for monitoring immunomodulating therapy 11/20/2021   Environmental allergies 08/12/2020   Esophageal candidiasis (HCC) 06/26/2022   Essential hypertension 06/27/2015    Fibrositis 08/28/2014   Formatting of this note might be different from the original. Fibromyalgia Formatting of this note might be different from the original. Formatting of this note might be different from the original. Fibromyalgia   Flank pain 11/24/2020   Gastroesophageal reflux disease with esophagitis without hemorrhage 08/01/2019   Formatting of this note might be different from the original. Formatting of this note might be different from the original. 07/2019: chronic symptoms of esophageal reflux since prior to sleeve gastrectomy surgery. Symptoms have worsened over the last 6 months with recent EGD findings of grade B esophagitis, irregular z line, erythematous mucosa, and evidence of sleeve gastrectomy. 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 Formatting of this note might be different from the original. 07/2019: 8 mm colonic polyp removed during colonoscopy, biopsy showed serrated polyp. Repeat colonoscopy recommended in one year. Formatting of this note might be different from the original. 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 Hypertension Formatting of this note might be different from the original. Formatting of this note might be different from the original. 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 Keratoconus Formatting of this note might be different from the original. Formatting of this note might be different from the original. Keratoconus Formatting of this note might be different from the original. Formatting of this note might be different from the original. Keratoconus Formatting of this note might be different from the or   Microhematuria 06/27/2015   Migraine 08/28/2014   Formatting of this note might be different from the original. Migraine Formatting of this note might be different from the original. Formatting of this note might be different from the original. Migraine Formatting of this note might be different from the original. Migraine Formatting of this note might be different from the original. Migraine Formatting of this note might be different from the or   Migraine without aura and without status migrainosus, not intractable 08/28/2014   Formatting of this note might be different from the original. Formatting of this note might be different from the original. Formatting of this note might be different from the original. Migraine Formatting of this note might be different from the original. Migraine Formatting of this note might be different from the original. Formatting of this note might be different from the original. Migraine F   Mild intermittent asthma without complication 10/30/2008   Formatting of this note might be different from the original. Formatting of this note might be different from the original. Formatting of this note might be different from the original. Asthma Formatting of this note might be different from the original. Asthma Formatting of this note might be different from the original. Formatting of this note might be different from the original. 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 Formatting of this note might be different from the original. 01/25/2016 with Dr. Dionisio David at [facility]. [place] of this note might be different from the original. 01/25/2016 with Dr. Dionisio David at [facility]. [place] of this note might be different from the original. Formatting of this note might be different from    Seronegative inflammatory arthritis 08/28/2014   Formatting of this note might be different from the original. Formatting of this note might be different from the original. Arthritis; diagnosed as IBD related - 2012 Formatting of this note might be different from the original. Followed by rheumatologist. Had to change providers due to an insurance change earlier this year resulting in patient not being able to have Remicade for ~6 months. Restar   Sero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 arthritis (HCC)    Slow transit constipation 08/01/2019   Formatting of this note might be different  from the original. Formatting of this note might be different from the original. Treated with PRN miralax. Recent colonoscopy 07/2019. Formatting of this note might be different from the original. Treated with PRN miralax. Recent colonoscopy 07/2019. Formatting of this note might be different from the original. Formatting of this note might be different f   Status post placement of ureteral stent 02/06/2021   Tooth infection 08/12/2020   Unstable angina (HCC)  07/29/2023   Ureteral stone 01/20/2022   Urinary tract infection, site not specified 06/26/2022    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   Coronary artery disease s/p PCI/DES to LAD 07/01/2023 12/28/2022   Precordial chest pain 10/13/2022   Hyperlipidemia 10/07/2022   Hiatal hernia 10/07/2022   Esophageal candidiasis (HCC) 06/26/2022   Urinary tract infection, site not specified 06/26/2022   Ureteral stone 01/20/2022   Encounter for monitoring immunomodulating therapy 11/20/2021   Rheumatoid arthritis of multiple sites with 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 factor (HCC) 11/16/2021   Pyelonephritis 09/25/2021   De Quervain's tenosynovitis, left 07/31/2021   Contusion of left wrist 07/08/2021   Status post placement of ureteral stent 02/06/2021   Proteinuria 08/12/2020   Environmental allergies 08/12/2020   History of kidney stones 08/12/2020   Tooth infection 08/12/2020   Gastroesophageal reflux disease with esophagitis without hemorrhage 08/01/2019   Slow transit constipation 08/01/2019   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   History of colon polyps 06/19/2019   Irregular astigmatism of both eyes 10/11/2018   Nephrolithiasis 09/08/2018   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   Plantar wart 11/04/2016   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   Myopia with astigmatism and presbyopia 05/07/2016   Overactive bladder 11/18/2015   Incontinence 10/07/2015   Albuminuria 06/27/2015   Essential hypertension  06/27/2015   Microhematuria 06/27/2015   Hematuria 06/18/2015   Cataract 05/06/2015   Dry eyes 05/06/2015   Crohn's disease (HCC) 04/21/2015   Osteoarthritis of both knees 04/21/2015   Right knee pain 04/21/2015   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/30/2014   History of abnormal cervical Pap smear 11/29/2014   Fibrositis 08/28/2014   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   Migraine without aura and without status migrainosus, not intractable 08/28/2014   Migraine 08/28/2014   Seronegative inflammatory arthritis 08/28/2014   Increased urinary frequency 07/16/2013   Dysfunction of both eustachian tubes 08/31/2012   Asthma 10/30/2008   Mild intermittent asthma without complication 10/30/2008    Past Surgical History:  Procedure Laterality Date   BACK SURGERY     L5-S1   CERVICAL CONE BIOPSY  04/2000   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   CORONARY STENT INTERVENTION N/A 07/01/2023   Procedure: CORONARY STENT INTERVENTION;  Surgeon: Iran Ouch, MD;  Location: [facility];  Service: Cardiovascular;  Laterality: N/A;   EYE SURGERY     GASTRIC BYPASS  2017   Sleve   LAPAROSCOPIC GASTRIC SLEEVE RESECTION     LEFT HEART CATH AND CORONARY ANGIOGRAPHY N/A 07/01/2023   Procedure: LEFT HEART CATH AND CORONARY ANGIOGRAPHY;  Surgeon: Iran Ouch, MD;  Location: [facility];  Service: Cardiovascular;  Laterality: N/A;   LEFT HEART CATH AND CORONARY ANGIOGRAPHY N/A 08/01/2023   Procedure: LEFT HEART CATH AND CORONARY ANGIOGRAPHY;  Surgeon: Yates Decamp, MD;  Location: [facility];  Service: Cardiovascular;  Laterality: N/A;   Low back disc surgery     06/2000   TOTAL KNEE ARTHROPLASTY Right 11/2016    OB History     Gravida  0   Para  0   Term  0   Preterm  0   AB  0   Living  0      SAB  0   IAB  0   Ectopic  0   Multiple  0   Live Births  0            Home Medications    Prior to Admission medications   Medication Sig  Start Date End Date Taking? Authorizing Provider  acetaminophen (TYLENOL) 500 MG tablet Take 1,000 mg by mouth every 12 (twelve) hours as needed for mild pain or moderate pain.    [provider]  albuterol (PROVENTIL) (2.5 MG/3ML) 0.083% nebulizer solution Take 3 mLs (2.5 mg total) by nebulization every 6 (six) hours as needed for wheezing or shortness of breath. 10/13/23   Rosezella Rumpf, PA-C  amLODipine (NORVASC) 10 MG tablet Take 1 tablet (10 mg total) by mouth daily. 08/08/23 11/06/23  Georgeanna Lea, MD  atorvastatin (LIPITOR) 80 MG tablet Take 1 tablet (80 mg total) by mouth daily. 09/19/23   Georgeanna Lea, MD  B Complex-C (B-COMPLEX WITH VITAMIN C) tablet Take 1 tablet by mouth daily. 09/06/19   [provider]  Cholecalciferol (VITAMIN D3) 50 MCG (2000 UT) TABS Take 1 tablet by mouth daily.    [provider]  EPINEPHrine (EPIPEN 2-PAK) 0.3 mg/0.3 mL IJ SOAJ injection Inject 0.3 mg into the muscle as needed for anaphylaxis. 01/18/23   Marcelyn Bruins, MD  esomeprazole (NEXIUM) 40 MG capsule Take 1 capsule (40 mg total) by mouth 2 (two) times daily before a meal. 11/30/22   Olive Bass, FNP  famotidine (PEPCID) 40 MG tablet Take 1 tablet (40 mg total) by mouth daily. 11/30/22   Olive Bass, FNP  Golimumab [facility] ARIA IV) Inject 200 mg into the vein every 8 (eight) weeks.    [provider]  levocetirizine (XYZAL) 5 MG tablet Take 5 mg by mouth. 2 tablets once day    [provider]  lubiprostone (AMITIZA) 8 MCG capsule Take 8 mcg by mouth daily with breakfast. 10/07/23 04/04/24  [provider]  mirabegron ER (MYRBETRIQ) 50 MG TB24 tablet Take 1 tablet (50 mg total) by mouth daily. 11/30/22   Olive Bass, FNP  montelukast (SINGULAIR) 10 MG tablet Take 1 tablet (10 mg total) by mouth at bedtime. Patient taking differently: Take 10 mg by mouth daily. 11/30/22   Olive Bass, FNP   nitroGLYCERIN (NITROSTAT) 0.4 MG SL tablet Place 1 tablet (0.4 mg total) under the tongue every 5 (five) minutes as needed for chest pain. 10/28/23   Tolia, Sunit, DO  oxyCODONE (OXY IR/ROXICODONE) 5 MG immediate release tablet Take 5 mg by mouth as needed for moderate pain (pain score 4-6) or severe pain (pain score 7-10). Very rarely    [provider]  ticagrelor (BRILINTA) 90 MG TABS tablet Take 1 tablet (90 mg total) by mouth 2 (two) times daily. 07/01/23   Lonia Blood,  MD    Family History Family History  Adopted: Yes  Problem Relation Age of Onset   Diabetes Mother    Hypertension Mother    Diabetes Father    Hypertension Father    Hypertension Sister    Diabetes Sister    Hypertension Brother    Diabetes Brother    Diabetes Paternal Grandmother    Cancer Other    Asthma Neg Hx    Allergic rhinitis Neg Hx    Atopy Neg Hx    Eczema Neg Hx     Social History Social History   Tobacco Use   Smoking status: Never    Passive exposure: Never   Smokeless tobacco: Never  Vaping Use   Vaping status: Never Used  Substance Use Topics   Alcohol use: Yes    Comment: rare   Drug use: Never     Allergies   Albuterol sulfate, Tape, Formoterol, Hydrocodone, Hydrocodone-acetaminophen, Nickel, Other, Oxycodone-acetaminophen, Salmeterol, Strawberry extract, Advair hfa [fluticasone-salmeterol], Proventil hfa [albuterol], Asa [aspirin], Cleocin [clindamycin], Clindamycin/lincomycin, Fludrocortisone acetate, Gabapentin, Hydrochlorothiazide, Imipramine, Ipratropium bromide, Medroxyprogesterone, Meloxicam, Metformin and related, Nystatin, Penicillins, Pred forte [prednisolone], Prednisone, Solu-medrol [methylprednisolone], Sulfa antibiotics, Tegaderm ag mesh [silver], Toradol [ketorolac tromethamine], and Tramadol   Review of Systems Review of Systems  Musculoskeletal:  Positive for arthralgias.     Physical Exam Triage Vital Signs ED Triage Vitals  Encounter  Vitals Group     BP 10/31/23 1119 130/79     Systolic BP Percentile --      Diastolic BP Percentile --      Pulse Rate 10/31/23 1119 76     Resp 10/31/23 1119 20     Temp 10/31/23 1119 97.9 F (36.6 C)     Temp Source 10/31/23 1119 Oral     SpO2 10/31/23 1119 97 %     Weight --      Height --      Head Circumference --      Peak Flow --      Pain Score 10/31/23 1121 8     Pain Loc --      Pain Education --      Exclude from Growth Chart --    No data found.  Updated Vital Signs BP 130/79 (BP Location: Right Arm)   Pulse 76   Temp 97.9 F (36.6 C) (Oral)   Resp 20   SpO2 97%   Visual Acuity Right Eye Distance:   Left Eye Distance:   Bilateral Distance:    Right Eye Near:   Left Eye Near:    Bilateral Near:     Physical Exam Constitutional:      General: She is not in acute distress.    Appearance: Normal appearance. She is not ill-appearing.  Pulmonary:     Effort: Pulmonary effort is normal.  Neurological:     Mental Status: She is alert.  Psychiatric:        Mood and Affect: Mood normal.      UC Treatments / Results  Labs (all labs ordered are listed, but only abnormal results are displayed) Labs Reviewed - No data to display  EKG   Radiology No results found.  Procedures Procedures (including critical care time)  Medications Ordered in UC Medications  triamcinolone acetonide (KENALOG-40) injection 40 mg (40 mg Intramuscular Given 10/31/23 1135)    Initial Impression / Assessment and Plan / UC Course  I have reviewed the triage vital signs and the nursing notes.  Pertinent labs &  imaging results that were available during my care of the patient were reviewed by me and considered in my medical decision making (see chart for details).     Rheumatoid arthritis-steroid injection given today for RA flare.  Has follow-up on the first for infusion. No other concerns today.  Follow-up as needed Final Clinical Impressions(s) / UC Diagnoses    Final diagnoses:  Rheumatoid arthritis flare (HCC)     Discharge Instructions      We have given you a steroid injection here.  Follow up with your infusion as planned.      ED Prescriptions   None    PDMP not reviewed this encounter.   Janace Aris, FNP 10/31/23 1136

## 2023-10-31 NOTE — Telephone Encounter (Signed)
 Pt calling to check on strips, requesting cb

## 2023-10-31 NOTE — Discharge Instructions (Signed)
 We have given you a steroid injection here.  Follow up with your infusion as planned.

## 2023-11-01 ENCOUNTER — Ambulatory Visit: Payer: 59 | Admitting: Cardiology

## 2023-11-05 LAB — BASIC METABOLIC PANEL WITH GFR
BUN/Creatinine Ratio: 15 (ref 9–23)
BUN: 11 mg/dL (ref 6–24)
CO2: 24 mmol/L (ref 20–29)
Calcium: 9.7 mg/dL (ref 8.7–10.2)
Chloride: 102 mmol/L (ref 96–106)
Creatinine, Ser: 0.74 mg/dL (ref 0.57–1.00)
Glucose: 139 mg/dL — ABNORMAL HIGH (ref 70–99)
Potassium: 3.7 mmol/L (ref 3.5–5.2)
Sodium: 141 mmol/L (ref 134–144)
eGFR: 96 mL/min/{1.73_m2} (ref >59)

## 2023-11-05 LAB — TSH: TSH: 0.983 u[IU]/mL (ref 0.450–4.500)

## 2023-11-05 LAB — PRO B NATRIURETIC PEPTIDE: NT-Pro BNP: 316 pg/mL — ABNORMAL HIGH (ref 0–249)

## 2023-11-07 ENCOUNTER — Telehealth: Payer: Self-pay | Admitting: Cardiology

## 2023-11-07 NOTE — Telephone Encounter (Signed)
Patient calling in about her results. Please advise

## 2023-11-07 NOTE — Telephone Encounter (Signed)
 Spoke with pt and explained that Dr. Odis Hollingshead has not sent his result note yet regarding the recent labs completed. Explained that Dr. Odis Hollingshead is not in the office this week but as soon as the result note is completed, we will call her to go over the results and any recommendations. Pt verbalized understanding and had no further questions at this time.

## 2023-11-07 NOTE — Telephone Encounter (Signed)
 Patient is following up regarding results. She requesting to speak with RN over the phone regarding results. She says she would prefer this over MyChart messaging if possible.

## 2023-11-08 ENCOUNTER — Ambulatory Visit: Payer: 59 | Admitting: Cardiology

## 2023-11-08 NOTE — Telephone Encounter (Signed)
 Pt was sent results via MyChart and has viewed the results and Dr. Emelda Brothers result note.

## 2023-11-09 ENCOUNTER — Ambulatory Visit (INDEPENDENT_AMBULATORY_CARE_PROVIDER_SITE_OTHER): Admitting: Student

## 2023-11-09 ENCOUNTER — Encounter (HOSPITAL_BASED_OUTPATIENT_CLINIC_OR_DEPARTMENT_OTHER): Payer: Self-pay | Admitting: Student

## 2023-11-09 VITALS — BP 137/72 | HR 75 | Temp 98.6°F | Resp 16 | Ht 63.0 in | Wt 221.2 lb

## 2023-11-09 DIAGNOSIS — D72825 Bandemia: Secondary | ICD-10-CM

## 2023-11-09 NOTE — Patient Instructions (Signed)
 It was nice to see you today!  I will let you know your results.  If you have any problems before your next visit feel free to message me via MyChart (minor issues or questions) or call the office, otherwise you may reach out to schedule an office visit.  Thank you! Gerilyn Pilgrim Deagen Krass, PA-C

## 2023-11-09 NOTE — Addendum Note (Signed)
 Addended by: Melissa Montane on: 11/09/2023 02:02 PM   Modules accepted: Level of Service

## 2023-11-09 NOTE — Progress Notes (Signed)
 Acute Office Visit  Subjective:     Patient ID: Casey [name], female    DOB: Nov 06, 1970, 53 y.o.   MRN: [id number]  Chief Complaint  Patient presents with   Medical Management of Chronic Issues    Here to discuss labs done with Rheumo. WBC are low and hemoglobin keeps going down. They told pt to make appt with PCP. Was referred to Oncologist. Just finished wearing heart monitor for 7 days. Could not wear it longer due to adhesive allergies. Was waiting in line at store yesterday and got really dizzy and had to be sat down. Had small bag of fluids after getting infusion yesterday. Only change that pt can think of is starting Jardiance. Took nitrostat yesterday.   Discussed the use of AI scribe software for clinical note transcription with the patient, who gave verbal consent to proceed.  History of Present Illness   Casey [name] "Casey [name]" is a 53 year old female who presents with dizziness and headaches.  She has been experiencing dizziness and headaches for the past 24 to 36 hours. The dizziness is associated with nausea, photophobia, and headaches. The photophobia is severe, making it difficult to tolerate even minimal light exposure- notes that a lot of this has been present since she had corneal surgery. No real change in neck ROM. No fever, chills, or significant chest pain, but she notes some shortness of breath with exertion.  She has a history of recent elevated white blood cell count, recently noted at 18,900, which is higher than previous values. This is what led to today's visit. There are no signs of infection, and her white blood cell count has fluctuated in the past. She recalls a previous episode of elevated white blood cell count due to a kidney stone and infection.  She has a history of cornea transplants, contributing to her sensitivity to light. This sensitivity has been exacerbated by her current symptoms.  She has undergone previous imaging studies, including a CT scan and  MRI of her head and neck, which revealed neck stenosis. She has a history of migraines, which she suspects might be contributing to her current symptoms.  She is currently on Jardiance, which has increased her urination frequency. She compensates by increasing fluid intake.      ROS Per HPI     Objective:    BP 137/72   Pulse 75   Temp 98.6 F (37 C) (Oral)   Resp 16   Ht 5\' 3"  (1.6 m)   Wt 221 lb 3.2 oz (100.3 kg)   SpO2 98%   BMI 39.18 kg/m    Physical Exam Constitutional:      General: She is not in acute distress.    Appearance: Normal appearance. She is not ill-appearing.  HENT:     Head: Normocephalic and atraumatic.     Right Ear: External ear normal.     Left Ear: External ear normal.     Nose: Nose normal.  Eyes:     General: No scleral icterus.    Conjunctiva/sclera: Conjunctivae normal.  Cardiovascular:     Rate and Rhythm: Normal rate and regular rhythm.     Heart sounds: Normal heart sounds. No murmur heard.    No friction rub.  Pulmonary:     Effort: Pulmonary effort is normal. No respiratory distress.     Breath sounds: Normal breath sounds. No wheezing, rhonchi or rales.  Musculoskeletal:     Cervical back: Normal range of motion. No  rigidity.  Skin:    General: Skin is warm and dry.     Coloration: Skin is not jaundiced or pale.     Findings: Bruising (Slight on R arm) present. No lesion.  Neurological:     General: No focal deficit present.     Mental Status: She is alert.     Deep Tendon Reflexes: Reflexes normal.  Psychiatric:        Mood and Affect: Mood normal.        Behavior: Behavior normal.     No results found for any visits on 11/09/23.      Assessment & Plan:   Assessment and Plan    Dizziness with leukocytosis Presents with dizziness, headaches, nausea, and photophobia, worsening over the last 24-36 hours. Leukocytosis (WBC 18,900) with mild anemia (hemoglobin 11.4 g/dL) and no clear infection signs. Differential  includes underlying infection, vertiginous migraines, or post-concussion effects. No signs of severe infection or meningitis. Dizziness may relate to orthostatic hypotension or vestibular migraines. Watchful waiting advised; further imaging if symptoms worsen. Reviewed note from rheumatology. - Order stat CBC to assess WBC trend - Advise monitoring for worsening symptoms such as increased confusion, neck stiffness, frequent vomiting, or fever and seek emergency care if these occur - Encourage fluid intake and limit screen exposure - Consider further imaging if symptoms persist or worsen - Patient provided with strict ED precautions. - Continue to follow with cardiology - New problem with uncertain prognosis.  General Health Maintenance On Jardiance for renal protection as prescribed by nephrology. No new health maintenance issues discussed.      Return if symptoms worsen or fail to improve.  Teryl Lucy Rishawn Walck, PA-C

## 2023-11-10 ENCOUNTER — Telehealth (HOSPITAL_COMMUNITY): Payer: Self-pay

## 2023-11-10 ENCOUNTER — Emergency Department (HOSPITAL_COMMUNITY)

## 2023-11-10 ENCOUNTER — Emergency Department (HOSPITAL_COMMUNITY)
Admission: EM | Admit: 2023-11-10 | Discharge: 2023-11-11 | Disposition: A | Discharge status: Home / Self Care | Attending: Emergency Medicine | Admitting: Emergency Medicine

## 2023-11-10 ENCOUNTER — Other Ambulatory Visit: Payer: Self-pay

## 2023-11-10 ENCOUNTER — Ambulatory Visit: Payer: Self-pay

## 2023-11-10 ENCOUNTER — Encounter (HOSPITAL_BASED_OUTPATIENT_CLINIC_OR_DEPARTMENT_OTHER): Payer: Self-pay | Admitting: Student

## 2023-11-10 DIAGNOSIS — D72829 Elevated white blood cell count, unspecified: Secondary | ICD-10-CM | POA: Diagnosis not present

## 2023-11-10 DIAGNOSIS — R519 Headache, unspecified: Secondary | ICD-10-CM | POA: Diagnosis not present

## 2023-11-10 DIAGNOSIS — Z8616 Personal history of COVID-19: Secondary | ICD-10-CM | POA: Insufficient documentation

## 2023-11-10 DIAGNOSIS — H538 Other visual disturbances: Secondary | ICD-10-CM | POA: Diagnosis present

## 2023-11-10 LAB — COMPREHENSIVE METABOLIC PANEL WITH GFR
ALT: 18 U/L (ref 0–44)
AST: 47 U/L — ABNORMAL HIGH (ref 15–41)
Albumin: 3.7 g/dL (ref 3.5–5.0)
Alkaline Phosphatase: 99 U/L (ref 38–126)
Anion gap: 11 (ref 5–15)
BUN: 13 mg/dL (ref 6–20)
CO2: 22 mmol/L (ref 22–32)
Calcium: 9.6 mg/dL (ref 8.9–10.3)
Chloride: 105 mmol/L (ref 98–111)
Creatinine, Ser: 0.92 mg/dL (ref 0.44–1.00)
GFR, Estimated: >60 mL/min (ref >60)
Glucose, Bld: 115 mg/dL — ABNORMAL HIGH (ref 70–99)
Potassium: 3.9 mmol/L (ref 3.5–5.1)
Sodium: 138 mmol/L (ref 135–145)
Total Bilirubin: 0.9 mg/dL (ref 0.0–1.2)
Total Protein: 8 g/dL (ref 6.5–8.1)

## 2023-11-10 LAB — CBC WITH DIFFERENTIAL/PLATELET
Abs Immature Granulocytes: 0.1 10*3/uL — ABNORMAL HIGH (ref 0.00–0.07)
Basophils Absolute: 0.1 10*3/uL (ref 0.0–0.2)
Basophils Absolute: 0.1 10*3/uL (ref 0.0–0.1)
Basophils Relative: 1 %
Basos: 0 %
EOS (ABSOLUTE): 0.2 10*3/uL (ref 0.0–0.4)
Eos: 1 %
Eosinophils Absolute: 0.1 10*3/uL (ref 0.0–0.5)
Eosinophils Relative: 1 %
HCT: 37.7 % (ref 36.0–46.0)
Hematocrit: 38.8 % (ref 34.0–46.6)
Hemoglobin: 12 g/dL (ref 11.1–15.9)
Hemoglobin: 11.9 g/dL — ABNORMAL LOW (ref 12.0–15.0)
Immature Grans (Abs): 0.1 10*3/uL (ref 0.0–0.1)
Immature Granulocytes: 1 %
Immature Granulocytes: 1 %
Lymphocytes Absolute: 3.8 10*3/uL — ABNORMAL HIGH (ref 0.7–3.1)
Lymphocytes Relative: 19 %
Lymphs Abs: 3.4 10*3/uL (ref 0.7–4.0)
Lymphs: 25 %
MCH: 25.9 pg — ABNORMAL LOW (ref 26.6–33.0)
MCH: 25.6 pg — ABNORMAL LOW (ref 26.0–34.0)
MCHC: 30.9 g/dL — ABNORMAL LOW (ref 31.5–35.7)
MCHC: 31.6 g/dL (ref 30.0–36.0)
MCV: 84 fL (ref 79–97)
MCV: 81.1 fL (ref 80.0–100.0)
Monocytes Absolute: 1.1 10*3/uL — ABNORMAL HIGH (ref 0.1–0.9)
Monocytes Absolute: 1.5 10*3/uL — ABNORMAL HIGH (ref 0.1–1.0)
Monocytes Relative: 8 %
Monocytes: 8 %
Neutro Abs: 13.3 10*3/uL — ABNORMAL HIGH (ref 1.7–7.7)
Neutrophils Absolute: 9.9 10*3/uL — ABNORMAL HIGH (ref 1.4–7.0)
Neutrophils Relative %: 70 %
Neutrophils: 65 %
Platelets: 453 10*3/uL — ABNORMAL HIGH (ref 150–450)
Platelets: 457 10*3/uL — ABNORMAL HIGH (ref 150–400)
RBC: 4.64 x10E6/uL (ref 3.77–5.28)
RBC: 4.65 MIL/uL (ref 3.87–5.11)
RDW: 14.5 % (ref 11.7–15.4)
RDW: 15.1 % (ref 11.5–15.5)
WBC: 15.2 10*3/uL — ABNORMAL HIGH (ref 3.4–10.8)
WBC: 18.5 10*3/uL — ABNORMAL HIGH (ref 4.0–10.5)
nRBC: 0 % (ref 0.0–0.2)

## 2023-11-10 LAB — URINALYSIS, ROUTINE W REFLEX MICROSCOPIC
Bilirubin Urine: NEGATIVE
Glucose, UA: >=500 mg/dL — AB
Hgb urine dipstick: NEGATIVE
Ketones, ur: NEGATIVE mg/dL
Leukocytes,Ua: NEGATIVE
Nitrite: NEGATIVE
Protein, ur: NEGATIVE mg/dL
Specific Gravity, Urine: 1.02 (ref 1.005–1.030)
pH: 6 (ref 5.0–8.0)

## 2023-11-10 LAB — RESP PANEL BY RT-PCR (RSV, FLU A&B, COVID)  RVPGX2
Influenza A by PCR: NEGATIVE
Influenza B by PCR: NEGATIVE
Resp Syncytial Virus by PCR: NEGATIVE
SARS Coronavirus 2 by RT PCR: NEGATIVE

## 2023-11-10 LAB — URINALYSIS, MICROSCOPIC (REFLEX): RBC / HPF: NONE SEEN RBC/hpf (ref 0–5)

## 2023-11-10 LAB — I-STAT CG4 LACTIC ACID, ED: Lactic Acid, Venous: 0.8 mmol/L (ref 0.5–1.9)

## 2023-11-10 NOTE — ED Notes (Signed)
 Patient transported to X-ray

## 2023-11-10 NOTE — Telephone Encounter (Signed)
 Copied from CRM [id number]. Topic: General - Other >> Nov 10, 2023  4:24 PM Emylou G wrote: Reason for CRM: Patient saw PCP yesterday.[name] adv increased light sensitivity and painful underneath both eyes.  Wasn't sure if needs to go Urgent Care or ER?  [phone]   Chief Complaint: blurred vision Symptoms: blurred vision worse, dizziness, HA, increased light sensitivity, eye pressure, weakness, fatigue  Frequency: dizziness since Feb, other sx few days, increased light sensitivity and eye pressure today  Pertinent Negatives: NA Disposition: [x] ED /[] Urgent Care (no appt availability in office) / [] Appointment(In office/virtual)/ []  [place] [facility]/ [] Home Care/ [] Refused Recommended Disposition /[] [place] [facility]/ []  Follow-up with PCP Additional Notes: pt had OV yesterday with PCP and was advised if sx got worse to go to ED, pt states that she sent message to PCP this morning about worsening sx but didn't get a response back. She states dizziness and blurred vision got so bad while driving home today that friend had to drive rest of the way. Pt says eye pressure feels like a vise around head and has started using yellow tinged sunglasses to help with pressure. She states it did help some went from 9 to 6-7/10. I advised based on pt sx and worsening or new ones to go to ED. Pt husband unable to transport and friend has already left to go home but pt would call her back and if unable to take will call EMS. I asked pt if she felt comfortable calling EMS or she preferred me, she said she would. Attempted to call [facility] but no answer.   Reason for Disposition  [1] Blurred vision or visual changes AND [2] present now AND [3] sudden onset or new (e.g., minutes, hours, days)  (Exception: Seeing floaters / black specks OR previously diagnosed migraine headaches with same symptoms.)  Answer Assessment - Initial Assessment Questions 1. DESCRIPTION: "How has your vision changed?" (e.g., complete vision  loss, blurred vision, double vision, floaters, etc.)     Blurred vision  2. LOCATION: "One or both eyes?" If one, ask: "Which eye?"     Both eyes  4. ONSET: "When did this begin?" "Did it start suddenly or has this been gradual?"     Dizziness on and off since Feb, had OV yesterday and sx have gotten worse and new sx started  5. PATTERN: "Does this come and go, or has it been constant since it started?"     Constant  6. PAIN: "Is there any pain in your eye(s)?"  (Scale 1-10; or mild, moderate, severe)   - NONE (0): No pain.   - MILD (1-3): Doesn't interfere with normal activities.   - MODERATE (4-7): Interferes with normal activities or awakens from sleep.    - SEVERE (8-10): Excruciating pain, unable to do any normal activities.     Pressure/pain 6-7/10 7. CONTACTS-GLASSES: "Do you wear contacts or glasses?"     No having to wear yellow tinged glasses to help with pressure  9. OTHER SYMPTOMS: "Do you have any other symptoms?" (e.g., confusion, headache, arm or leg weakness, speech problems)     Increased light sensitivity, eye pressure, HA, weakness, and fatigue  Protocols used: Vision Loss or Change-A-AH

## 2023-11-10 NOTE — ED Triage Notes (Signed)
 Pt BIB EMS with c/o blurred vision and pressure behind eyes for 5 days. Getting worse today. Pt had recent dx of concussion. No loc, no falls since then. Denies CP SOB, fever, or chills.

## 2023-11-10 NOTE — Telephone Encounter (Signed)
 Called and spoke with pt in regards to CR, pt ask for her referral to be sent to [facility]. Adv pt I will fax her referral to [facility].   Closed referral

## 2023-11-10 NOTE — ED Provider Notes (Signed)
 [place] EMERGENCY DEPARTMENT AT [facility] Provider Note   CSN: [id number] Arrival date & time: 11/10/23  1810     History  Chief Complaint  Patient presents with   Blurred Vision    Casey [name] is a 53 y.o. female.  Patient complains of an elevated white blood cell count.  Patient reports that she saw her doctor on April 1 and was told that she had a white blood cell count of 18.9.  Patient reports it was rechecked on 4 2 and was 15.2.  Patient states that she talked with her provider today and they advised her to come to the emergency department.  Patient reports that she has felt some generalized weakness.  Patient states that she has experienced some pressure behind her eyes.  Patient denies any actual eye pain.  Patient states that she does not have any sinus congestion or sinus pressure.  Patient denies any fever or cough she denies any exposure to anyone with flu or COVID.  She states that she did have COVID in February.  Patient has not had any nausea or vomiting.  She denies any diarrhea.  Patient denies any fever.  Patient tells me that she has rheumatoid arthritis.  She is currently seeing a rheumatologist.  Patient has a history of migraines and asthma.  Patient is currently followed by cardiology.  She reports that she has had a stent in the past.  She is not having any chest pain.        Home Medications Prior to Admission medications   Medication Sig Start Date End Date Taking? Authorizing Provider  acetaminophen (TYLENOL) 500 MG tablet Take 1,000 mg by mouth every 12 (twelve) hours as needed for mild pain or moderate pain.    [provider]  albuterol (PROVENTIL) (2.5 MG/3ML) 0.083% nebulizer solution Take 3 mLs (2.5 mg total) by nebulization every 6 (six) hours as needed for wheezing or shortness of breath. 10/13/23   Rosezella Rumpf, PA-C  amLODipine (NORVASC) 10 MG tablet Take 1 tablet (10 mg total) by mouth daily. 08/08/23 11/09/23  Georgeanna Lea, MD  atorvastatin (LIPITOR) 80 MG tablet Take 1 tablet (80 mg total) by mouth daily. 09/19/23   Georgeanna Lea, MD  B Complex-C (B-COMPLEX WITH VITAMIN C) tablet Take 1 tablet by mouth daily. 09/06/19   [provider]  Cholecalciferol (VITAMIN D3) 50 MCG (2000 UT) TABS Take 1 tablet by mouth daily.    [provider]  EPINEPHrine (EPIPEN 2-PAK) 0.3 mg/0.3 mL IJ SOAJ injection Inject 0.3 mg into the muscle as needed for anaphylaxis. 01/18/23   Marcelyn Bruins, MD  esomeprazole (NEXIUM) 40 MG capsule Take 1 capsule (40 mg total) by mouth 2 (two) times daily before a meal. Patient taking differently: Take 80 mg by mouth daily. 11/30/22   Olive Bass, FNP  famotidine (PEPCID) 40 MG tablet Take 1 tablet (40 mg total) by mouth daily. 11/30/22   Olive Bass, FNP  Golimumab [facility] ARIA IV) Inject 200 mg into the vein every 8 (eight) weeks.    [provider]  JARDIANCE 25 MG TABS tablet Take 25 mg by mouth daily. 11/03/23 11/02/24  [provider]  levocetirizine (XYZAL) 5 MG tablet Take 10 mg by mouth daily.    [provider]  lubiprostone (AMITIZA) 8 MCG capsule Take 8 mcg by mouth daily with breakfast. 10/07/23 04/04/24  [provider]  mirabegron ER (MYRBETRIQ) 50 MG TB24 tablet  Take 1 tablet (50 mg total) by mouth daily. 11/30/22   Olive Bass, FNP  montelukast (SINGULAIR) 10 MG tablet Take 1 tablet (10 mg total) by mouth at bedtime. Patient taking differently: Take 10 mg by mouth daily. 11/30/22   Olive Bass, FNP  nitroGLYCERIN (NITROSTAT) 0.4 MG SL tablet Place 1 tablet (0.4 mg total) under the tongue every 5 (five) minutes as needed for chest pain. 10/28/23   Tolia, Sunit, DO  oxyCODONE (OXY IR/ROXICODONE) 5 MG immediate release tablet Take 5 mg by mouth as needed for moderate pain (pain score 4-6) or severe pain (pain score 7-10). Very rarely    [provider]  ticagrelor  (BRILINTA) 90 MG TABS tablet Take 1 tablet (90 mg total) by mouth 2 (two) times daily. 07/01/23   Lonia Blood, MD      Allergies    Albuterol sulfate, Tape, Formoterol, Hydrocodone, Hydrocodone-acetaminophen, Nickel, Other, Oxycodone-acetaminophen, Salmeterol, Strawberry extract, Advair hfa [fluticasone-salmeterol], Proventil hfa [albuterol], Asa [aspirin], Cleocin [clindamycin], Clindamycin/lincomycin, Fludrocortisone acetate, Gabapentin, Hydrochlorothiazide, Imipramine, Ipratropium bromide, Medroxyprogesterone, Meloxicam, Metformin and related, Nystatin, Penicillins, Pred forte [prednisolone], Prednisone, Solu-medrol [methylprednisolone], Sulfa antibiotics, Tegaderm ag mesh [silver], Toradol [ketorolac tromethamine], and Tramadol    Review of Systems   Review of Systems  All other systems reviewed and are negative.   Physical Exam Updated Vital Signs BP 138/74 (BP Location: Left Arm)   Pulse 73   Temp 98.6 F (37 C) (Oral)   Resp 16   Ht 5\' 3"  (1.6 m)   Wt 100.2 kg   SpO2 99%   BMI 39.15 kg/m  Physical Exam Vitals and nursing note reviewed.  Constitutional:      Appearance: She is well-developed.  HENT:     Head: Normocephalic.     Right Ear: External ear normal.     Left Ear: External ear normal.     Nose: Nose normal.     Mouth/Throat:     Mouth: Mucous membranes are moist.  Eyes:     Extraocular Movements: Extraocular movements intact.     Pupils: Pupils are equal, round, and reactive to light.  Cardiovascular:     Rate and Rhythm: Normal rate and regular rhythm.  Pulmonary:     Effort: Pulmonary effort is normal.  Abdominal:     General: There is no distension.  Musculoskeletal:        General: Normal range of motion.     Cervical back: Normal range of motion.  Skin:    General: Skin is warm.  Neurological:     General: No focal deficit present.     Mental Status: She is alert and oriented to person, place, and time.     ED Results / Procedures /  Treatments   Labs (all labs ordered are listed, but only abnormal results are displayed) Labs Reviewed  CBC WITH DIFFERENTIAL/PLATELET - Abnormal; Notable for the following components:      Result Value   WBC 18.5 (*)    Hemoglobin 11.9 (*)    MCH 25.6 (*)    Platelets 457 (*)    Neutro Abs 13.3 (*)    Monocytes Absolute 1.5 (*)    Abs Immature Granulocytes 0.10 (*)    All other components within normal limits  COMPREHENSIVE METABOLIC PANEL WITH GFR - Abnormal; Notable for the following components:   Glucose, Bld 115 (*)    AST 47 (*)    All other components within normal limits  URINALYSIS, ROUTINE W REFLEX MICROSCOPIC -  Abnormal; Notable for the following components:   Glucose, UA >=500 (*)    All other components within normal limits  URINALYSIS, MICROSCOPIC (REFLEX) - Abnormal; Notable for the following components:   Bacteria, UA RARE (*)    All other components within normal limits  RESP PANEL BY RT-PCR (RSV, FLU A&B, COVID)  RVPGX2  CULTURE, BLOOD (ROUTINE X 2)  CULTURE, BLOOD (ROUTINE X 2)  I-STAT CG4 LACTIC ACID, ED    EKG None  Radiology DG Chest Port 1 View Result Date: 11/10/2023 CLINICAL DATA:  Weakness, blurred vision, photophobia EXAM: PORTABLE CHEST 1 VIEW COMPARISON:  10/16/2023 FINDINGS: The heart size and mediastinal contours are within normal limits. Both lungs are clear. The visualized skeletal structures are unremarkable. IMPRESSION: No active disease. Electronically Signed   By: Sharlet Salina M.D.   On: 11/10/2023 22:41   CT Head Wo Contrast Result Date: 11/10/2023 CLINICAL DATA:  Headache, new onset (Age >= 51y) EXAM: CT HEAD WITHOUT CONTRAST TECHNIQUE: Contiguous axial images were obtained from the base of the skull through the vertex without intravenous contrast. RADIATION DOSE REDUCTION: This exam was performed according to the departmental dose-optimization program which includes automated exposure control, adjustment of the mA and/or kV according to  patient size and/or use of iterative reconstruction technique. COMPARISON:  None Available. FINDINGS: Brain: Normal anatomic configuration. No abnormal intra or extra-axial mass lesion or fluid collection. No abnormal mass effect or midline shift. No evidence of acute intracranial hemorrhage or infarct. Ventricular size is normal. Cerebellum unremarkable. Vascular: Unremarkable Skull: Intact Sinuses/Orbits: Paranasal sinuses are clear. Orbits are unremarkable. Other: Mastoid air cells and middle ear cavities are clear. IMPRESSION: 1. No acute intracranial abnormality. Electronically Signed   By: Helyn Numbers M.D.   On: 11/10/2023 21:46    Procedures Procedures    Medications Ordered in ED Medications - No data to display  ED Course/ Medical Decision Making/ A&P                                 Medical Decision Making Patient complains of concerns of an elevated white blood cell count.  Patient's primary care physician advised her to come to the emergency department for evaluation  Amount and/or Complexity of Data Reviewed Labs: ordered. Decision-making details documented in ED Course.    Details: Labs ordered reviewed and interpreted.  Patient's white blood cell count today is 18.5.  UA is negative flu and COVID are negative. Radiology: ordered.    Details: Chest x-ray obtained shows no acute disease CT head shows no acute findings  Risk Risk Details: Dr. Rodena Medin advised adding a lactic acid and doing blood cultures as well as an MRI of patient's brain as her symptoms seem to be only pain behind her eyes and elevated white blood cell count. Patient's care is turned over to [employer] in stable condition MRI pending.           Final Clinical Impression(s) / ED Diagnoses Final diagnoses:  Leukocytosis, unspecified type  Acute nonintractable headache, unspecified headache type    Rx / DC Orders ED Discharge Orders     None         Osie Cheeks 11/10/23  2352    Wynetta Fines, MD 11/10/23 [id number]

## 2023-11-11 ENCOUNTER — Emergency Department (HOSPITAL_COMMUNITY)

## 2023-11-11 NOTE — ED Provider Notes (Signed)
 Patient reassessed.  Labs and workup are thus far reassuring.  CT and MRI are normal.  No lactic acidosis.  UA is normal.  Patient is afebrile.  She does have a leukocytosis, but without any clear infectious symptoms.  This is nonspecific.  I have advised her to follow-up with her primary care doctor or return for new or worsening symptoms.   Roxy Horseman, PA-C 11/11/23 0122    Shon Baton, MD 11/11/23 [phone]

## 2023-11-11 NOTE — Discharge Instructions (Signed)
 No clear cause of your symptoms was identified in the ER tonight.  We recommend that you follow-up with your regular doctor for recheck.  Please return for new or worsening symptoms.

## 2023-11-13 ENCOUNTER — Encounter: Payer: Self-pay | Admitting: Cardiology

## 2023-11-13 DIAGNOSIS — R002 Palpitations: Secondary | ICD-10-CM | POA: Diagnosis not present

## 2023-11-14 ENCOUNTER — Telehealth (HOSPITAL_BASED_OUTPATIENT_CLINIC_OR_DEPARTMENT_OTHER): Payer: Self-pay | Admitting: Student

## 2023-11-14 ENCOUNTER — Ambulatory Visit: Payer: Self-pay

## 2023-11-14 NOTE — Telephone Encounter (Signed)
 No, her one episode of NSVT was auto triggered (monitor picked it up) likely incidental.   Patient triggered events: 21 the rhythm associated with this was either sinus (normal) and sinus tachycardia (HR >100bpm).   IF she is still having the palpitation we could do a trial of Cardizem 120 mg po qAM and she how she feels.   I reviewed her allergy list (32 entries) I don't see Cardizem on it but please verify prior to sending a script.   Let me know what she decides.   Paulyne Mooty Rondo, DO, [facility]

## 2023-11-14 NOTE — Telephone Encounter (Signed)
 Chief Complaint: thrush Symptoms: white tongue, fatigue, abd pain, vomiting Frequency: white tongue today, abd pain since yesterday, fatigue for 1 wk Pertinent Negatives: Patient denies fever, CP, SOB, diarrhea Disposition: [] ED /[] Urgent Care (no appt availability in office) / [] Appointment(In office/virtual)/ []  [place] [facility]/ [] Home Care/ [] Refused Recommended Disposition /[] [place] [facility]/ [x]  Follow-up with PCP Additional Notes: Pt reports a white tongue, fatigue, abd pain, and nausea with vomiting. Pt reports a frequent hx of oropharyngeal thrush. Pt noticed her tongue looks white today. Pt endorses abdominal pain beginning yesterday with nausea and "a little bit" of vomiting. Pt states she is able to keep down liquids but vomits after eating protein. Pt states when she has thrush she needs IV antibiotics (antifungals?) and has those administered with Infectious Disease. Pt states oral meds do not work for her. Pt would like to know what is the next step she can take today. Pt states the ED will most likely discharge her and state there is nothing wrong. Pt was seen 4/2 in the office and 4/3 in the ED and her WBC was high during those visits which she is concerned about (18.5). Pt states she has been fatigued for 1.5 wks.  RN called the [facility] inquiring about the correct dispo for this pt that states she needs IV antifungals. CAL said a nurse from the office will call the pt. Pt made aware that she will receive a call from the office.    Copied from CRM [id number]. Topic: Clinical - Red Word Triage >> Nov 14, 2023  2:32 PM Deaijah H wrote: Red Word that prompted transfer to Nurse Triage: Thrush. Feeling worse. Reason for Disposition  [1] White patches that stick to tongue or inner cheek AND [2] can be wiped off  Answer Assessment - Initial Assessment Questions 1. SYMPTOM: "What's the main symptom you're concerned about?" (e.g., chapped lips, dry mouth, lump, sores)     Thrush,  tongue is white, abdominal pain since yesterday , fatigue for 1.5 wks 2. ONSET: "When did the white tongue start?"     10 minutes ago she noticed her tongue looked white  3. PAIN: "Is there any pain?" If Yes, ask: "How bad is it?" (Scale: 1-10; mild, moderate, severe)   - MILD (1-3):  doesn't interfere with eating or normal activities   - MODERATE (4-7): interferes with eating some solids and normal activities   - SEVERE (8-10):  excruciating pain, interferes with most normal activities   - SEVERE DYSPHAGIA: can't swallow liquids, drooling     No 4. CAUSE: "What do you think is causing the symptoms?"     Thrush 5. OTHER SYMPTOMS: "Do you have any other symptoms?" (e.g., fever, sore throat, toothache, swelling)     Started feeling badly when she started Jardiance (started 3/27). Then her white count was 18.9. Roth[facility] repeated labs and her WBC was 18.5. It was also 18.5 in the ER 4/3. Hx of oropharyngeal candidiasis. Last June pt was with [facility] infectious disease and states she took IV antibiotics then. States she got it again in December/January and saw [facility] and was seen for 10 days for IV antibiotics. PO antibiotics "do nothing."  Denies mouth pain but endorses "a little" in her throat. Endorses abdominal pain that began yesterday, "around her diaphragm." Endorses nausea and "a little" bit of vomiting. "Any time I have eaten protein I have puked it up." States she is keeping liquids down. Denies diarrhea, endorses constipation and states she is  on motility medication. Wonders if abd pain is r/t constipation. Pt is currently on the toilet having a BM but they're much smaller.  Pt concerned about a WBC of 18.5.  Protocols used: Mouth Symptoms-A-AH

## 2023-11-15 ENCOUNTER — Telehealth: Payer: Self-pay

## 2023-11-15 ENCOUNTER — Ambulatory Visit (INDEPENDENT_AMBULATORY_CARE_PROVIDER_SITE_OTHER): Admitting: *Deleted

## 2023-11-15 ENCOUNTER — Ambulatory Visit

## 2023-11-15 ENCOUNTER — Other Ambulatory Visit: Payer: Self-pay

## 2023-11-15 ENCOUNTER — Ambulatory Visit: Admitting: Internal Medicine

## 2023-11-15 VITALS — BP 135/85 | HR 88 | Temp 98.4°F | Wt 218.0 lb

## 2023-11-15 DIAGNOSIS — B3781 Candidal esophagitis: Secondary | ICD-10-CM | POA: Diagnosis not present

## 2023-11-15 DIAGNOSIS — D72829 Elevated white blood cell count, unspecified: Secondary | ICD-10-CM

## 2023-11-15 DIAGNOSIS — J309 Allergic rhinitis, unspecified: Secondary | ICD-10-CM | POA: Diagnosis not present

## 2023-11-15 LAB — CBC WITH DIFFERENTIAL/PLATELET
Absolute Lymphocytes: 2770 {cells}/uL (ref 850–3900)
Absolute Monocytes: 1189 {cells}/uL — ABNORMAL HIGH (ref 200–950)
Basophils Absolute: 44 {cells}/uL (ref 0–200)
Basophils Relative: 0.3 %
Eosinophils Absolute: 189 {cells}/uL (ref 15–500)
Eosinophils Relative: 1.3 %
HCT: 36.6 % (ref 35.0–45.0)
Hemoglobin: 11.5 g/dL — ABNORMAL LOW (ref 11.7–15.5)
MCH: 25.2 pg — ABNORMAL LOW (ref 27.0–33.0)
MCHC: 31.4 g/dL — ABNORMAL LOW (ref 32.0–36.0)
MCV: 80.3 fL (ref 80.0–100.0)
MPV: 9.5 fL (ref 7.5–12.5)
Monocytes Relative: 8.2 %
Neutro Abs: 10310 {cells}/uL — ABNORMAL HIGH (ref 1500–7800)
Neutrophils Relative %: 71.1 %
Platelets: 425 10*3/uL — ABNORMAL HIGH (ref 140–400)
RBC: 4.56 10*6/uL (ref 3.80–5.10)
RDW: 14.6 % (ref 11.0–15.0)
Total Lymphocyte: 19.1 %
WBC: 14.5 10*3/uL — ABNORMAL HIGH (ref 3.8–10.8)

## 2023-11-15 LAB — COMPREHENSIVE METABOLIC PANEL WITH GFR
AG Ratio: 1.2 (calc) (ref 1.0–2.5)
ALT: 15 U/L (ref 6–29)
AST: 17 U/L (ref 10–35)
Albumin: 4.4 g/dL (ref 3.6–5.1)
Alkaline phosphatase (APISO): 107 U/L (ref 37–153)
BUN: 11 mg/dL (ref 7–25)
CO2: 29 mmol/L (ref 20–32)
Calcium: 10 mg/dL (ref 8.6–10.4)
Chloride: 101 mmol/L (ref 98–110)
Creat: 0.82 mg/dL (ref 0.50–1.03)
Globulin: 3.7 g/dL (ref 1.9–3.7)
Glucose, Bld: 122 mg/dL — ABNORMAL HIGH (ref 65–99)
Potassium: 3.4 mmol/L — ABNORMAL LOW (ref 3.5–5.3)
Sodium: 138 mmol/L (ref 135–146)
Total Bilirubin: 0.6 mg/dL (ref 0.2–1.2)
Total Protein: 8.1 g/dL (ref 6.1–8.1)
eGFR: 85 mL/min/{1.73_m2} (ref >=60)

## 2023-11-15 NOTE — Telephone Encounter (Signed)
 Got it thank you

## 2023-11-15 NOTE — Telephone Encounter (Signed)
 Patient called, she forgot to ask Dr. Drue Second if it's okay to continue with her allergy shots while receiving IV antibiotics via PICC line.   Linna Hoff, BSN, RN

## 2023-11-15 NOTE — Telephone Encounter (Signed)
 Okay to continue with allergy shots per Dr. Drue Second. Called Deb and notified her. Patient verbalized understanding and has no further questions.   Linna Hoff, BSN, RN

## 2023-11-15 NOTE — Telephone Encounter (Signed)
 Per Dr. Drue Second scheduled appointment for picc placement. Pt scheduled with [facility] @ 9 AM.  Pt aware of appt.  Community message forwarded to [employer] with OPAT orders.   OPAT Orders Discharge antibiotics to be given via PICC line Discharge antibiotics: Per pharmacy protocol anidulafungin per protocol   Duration: 14 days End Date:     [facility] Care Per Protocol:   Home health RN for IV administration and teaching; PICC line care and labs.     Labs weekly while on IV antibiotics: __x CBC with differential __ BMP _x_ CMP __ CRP __ ESR __ Vancomycin trough __ CK   _x_ Please pull PIC at completion of IV antibiotics   Fax weekly labs to [phone] Juanita Laster, RMA

## 2023-11-15 NOTE — Progress Notes (Signed)
 Immunotherapy   Patient Details  Name: Casey [name] MRN: [id number] Date of Birth: [birth year]  11/15/2023  Casey [name] started injections for POLLEN/PET & MOLD/MITE/CR Following schedule: B  Frequency:2 times per week Epi-Pen:Epi-Pen Available  Consent signed and patient instructions given. No issues after observed wait time.    Redge Gainer 11/15/2023, 2:20 PM

## 2023-11-15 NOTE — Progress Notes (Unsigned)
 Patient ID: Casey [name], female   DOB: Nov 28, 1970, 53 y.o.   MRN: [id number]  HPI No cough but feels like having increasing phlegm/only slight difficulty swallowing -- also noticing she is noticing more difficulty eating solid food. Started to vomit, after she eating solid food.  New onset of thrush.  Had remicaide infusion on 4/3 Has had increasing fatigue since infusion. Went to ED -  Also noticed that her WBC is elevated  Outpatient Encounter Medications as of 11/15/2023  Medication Sig   acetaminophen (TYLENOL) 500 MG tablet Take 1,000 mg by mouth every 12 (twelve) hours as needed for mild pain or moderate pain.   albuterol (PROVENTIL) (2.5 MG/3ML) 0.083% nebulizer solution Take 3 mLs (2.5 mg total) by nebulization every 6 (six) hours as needed for wheezing or shortness of breath.   atorvastatin (LIPITOR) 80 MG tablet Take 1 tablet (80 mg total) by mouth daily.   B Complex-C (B-COMPLEX WITH VITAMIN C) tablet Take 1 tablet by mouth daily.   Cholecalciferol (VITAMIN D3) 50 MCG (2000 UT) TABS Take 1 tablet by mouth daily.   EPINEPHrine (EPIPEN 2-PAK) 0.3 mg/0.3 mL IJ SOAJ injection Inject 0.3 mg into the muscle as needed for anaphylaxis.   esomeprazole (NEXIUM) 40 MG capsule Take 1 capsule (40 mg total) by mouth 2 (two) times daily before a meal. (Patient taking differently: Take 80 mg by mouth daily.)   famotidine (PEPCID) 40 MG tablet Take 1 tablet (40 mg total) by mouth daily.   Golimumab (SIMPONI ARIA IV) Inject 200 mg into the vein every 8 (eight) weeks.   levocetirizine (XYZAL) 5 MG tablet Take 10 mg by mouth daily.   lubiprostone (AMITIZA) 8 MCG capsule Take 8 mcg by mouth daily with breakfast.   mirabegron ER (MYRBETRIQ) 50 MG TB24 tablet Take 1 tablet (50 mg total) by mouth daily.   montelukast (SINGULAIR) 10 MG tablet Take 1 tablet (10 mg total) by mouth at bedtime. (Patient taking differently: Take 10 mg by mouth daily.)   nitroGLYCERIN (NITROSTAT) 0.4 MG SL tablet Place 1  tablet (0.4 mg total) under the tongue every 5 (five) minutes as needed for chest pain.   oxyCODONE (OXY IR/ROXICODONE) 5 MG immediate release tablet Take 5 mg by mouth as needed for moderate pain (pain score 4-6) or severe pain (pain score 7-10). Very rarely   ticagrelor (BRILINTA) 90 MG TABS tablet Take 1 tablet (90 mg total) by mouth 2 (two) times daily.   amLODipine (NORVASC) 10 MG tablet Take 1 tablet (10 mg total) by mouth daily.   JARDIANCE 25 MG TABS tablet Take 25 mg by mouth daily.   No facility-administered encounter medications on file as of 11/15/2023.     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   Coronary artery disease s/p PCI/DES to LAD 07/01/2023 12/28/2022   Precordial chest pain 10/13/2022   Hyperlipidemia 10/07/2022   Hiatal hernia 10/07/2022   Esophageal candidiasis (HCC) 06/26/2022   Urinary tract infection, site not specified 06/26/2022   Ureteral stone 01/20/2022   Encounter for monitoring immunomodulating  therapy 11/20/2021   Rheumatoid arthritis of multiple sites with 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 factor (HCC) 11/16/2021   Pyelonephritis 09/25/2021   De Quervain's tenosynovitis, left 07/31/2021   Contusion of left wrist 07/08/2021   Status post placement of ureteral stent 02/06/2021   Proteinuria 08/12/2020   Environmental allergies 08/12/2020   History of kidney stones 08/12/2020   Tooth infection 08/12/2020   Gastroesophageal reflux disease with esophagitis without hemorrhage 08/01/2019   Slow transit  constipation 08/01/2019   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   History of colon polyps 06/19/2019   Irregular astigmatism of both eyes 10/11/2018   Nephrolithiasis 09/08/2018   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   Plantar wart 11/04/2016   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   Myopia with astigmatism and presbyopia 05/07/2016   Overactive bladder 11/18/2015   Incontinence 10/07/2015   Albuminuria 06/27/2015   Essential hypertension 06/27/2015   Microhematuria 06/27/2015   Hematuria 06/18/2015   Cataract 05/06/2015   Dry eyes 05/06/2015   Crohn's disease (HCC) 04/21/2015   Osteoarthritis of both knees 04/21/2015   Right knee pain 04/21/2015   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/30/2014   History of abnormal cervical Pap smear 11/29/2014   Fibrositis 08/28/2014   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   Migraine without aura and without status migrainosus, not intractable 08/28/2014   Migraine 08/28/2014   Seronegative inflammatory arthritis 08/28/2014   Increased urinary frequency 07/16/2013   Dysfunction of both eustachian tubes 08/31/2012   Asthma 10/30/2008   Mild intermittent asthma without complication 10/30/2008     Health Maintenance Due  Topic Date Due   COVID-19 Vaccine (3 - Mixed Product risk series) 07/05/2022     Review of Systems  Physical Exam   BP 135/85   Pulse 88   Temp 98.4 F (36.9 C) (Oral)   Wt 218 lb (98.9 kg)   SpO2 97%   BMI 38.62 kg/m    Physical Exam  Constitutional:  oriented to person, place, and time. appears well-developed and well-nourished. No distress.  HENT: [place]/AT, PERRLA, no scleral icterus Mouth/Throat: Oropharynx is clear and moist. No oropharyngeal exudate. Poor dentition , +thrush Cardiovascular: Normal rate, regular rhythm and normal heart sounds. Exam reveals no gallop and no friction rub.  No murmur heard.  Pulmonary/Chest: Effort normal and breath sounds normal. No respiratory  distress.  has no wheezes.  Neck = supple, no nuchal rigidity Abdominal: Soft. Bowel sounds are normal.  exhibits no distension. There is no tenderness.  Lymphadenopathy: no cervical adenopathy. No axillary adenopathy Neurological: alert and oriented to person, place, and time.  Skin: Skin is warm and dry. No rash noted. No erythema.  Psychiatric: a normal mood and affect.  behavior is normal.   CBC Lab Results  Component Value Date   WBC 18.5 (H) 11/10/2023   RBC 4.65 11/10/2023   HGB 11.9 (L) 11/10/2023   HCT 37.7 11/10/2023   PLT 457 (H) 11/10/2023   MCV 81.1 11/10/2023   MCH 25.6 (L) 11/10/2023   MCHC 31.6 11/10/2023   RDW 15.1 11/10/2023   LYMPHSABS 3.4 11/10/2023   MONOABS 1.5 (H) 11/10/2023   EOSABS 0.1 11/10/2023    BMET Lab Results  Component Value Date   NA 138 11/10/2023   K 3.9 11/10/2023   CL 105 11/10/2023   CO2 22 11/10/2023   GLUCOSE 115 (H) 11/10/2023   BUN 13 11/10/2023   CREATININE 0.92 11/10/2023   CALCIUM 9.6 11/10/2023   GFRNONAA >60 11/10/2023      Assessment  and Plan  Candidal esophagitis --plan for picc line and micafungin 150mg  iv  for the first dose, then 100mg  iv daily for 14 days   Cbc with diff and cmp  Diagnosis: Candidal esophagitis  Culture Result: ------------  Allergies  Allergen Reactions   Albuterol Sulfate Shortness Of Breath    Other reaction(s): Other (See Comments)  can't breathe  Other reaction(s): Other (See Comments) can't breathe   Tape     Other reaction(s): Other (See Comments), Other (See Comments), Other (See Comments)  severe skin breakdown  Cast and Bandage Cover  severe skin breakdown  severe skin breakdown   Formoterol Other (See Comments)    Other reaction(s): Headaches  Other reaction(s): Headaches  Other reaction(s): Headaches  Headaches  Other reaction(s): Headaches Other reaction(s): Headaches    Other reaction(s): Headaches    Headaches   Hydrocodone Rash    Hydrocodone-Acetaminophen Rash   Nickel Rash and Other (See Comments)   Other Rash    cast and bandage use  cast and bandage use  cast and bandage use  cast and bandage use    cast and bandage use cast and bandage use   Oxycodone-Acetaminophen Other (See Comments)    headache   Salmeterol Rash   Strawberry Extract Hives    Full body rash from strawberry   Advair Hfa [Fluticasone-Salmeterol]     unknown   Proventil Hfa [Albuterol]     unknown   Asa [Aspirin] Rash   Cleocin [Clindamycin] Rash   Clindamycin/Lincomycin Rash   Fludrocortisone Acetate Rash   Gabapentin Rash   Hydrochlorothiazide Rash   Imipramine Rash   Ipratropium Bromide Rash   Medroxyprogesterone Rash   Meloxicam Rash   Metformin And Related Rash   Nystatin Rash   Penicillins Rash   Pred Forte [Prednisolone] Rash   Prednisone Rash   Solu-Medrol [Methylprednisolone] Rash   Sulfa Antibiotics Rash   Tegaderm Ag Mesh [Silver] Rash   Toradol [Ketorolac Tromethamine] Rash   Tramadol Rash    OPAT Orders Discharge antibiotics to be given via PICC line Discharge antibiotics: micafungin 100mg  IV Per pharmacy protocol anidulafungin per protocol  Duration: 14 days End Date:   [facility] Care Per Protocol:  Home health RN for IV administration and teaching; PICC line care and labs.    Labs weekly while on IV antibiotics: __x CBC with differential __ BMP _x_ CMP __ CRP __ ESR __ Vancomycin trough __ CK  _x_ Please pull PIC at completion of IV antibiotics  Fax weekly labs to [phone]  Clinic Follow Up Appt: 3 wk  @ [facility]  I have personally spent 50 minutes involved in face-to-face and non-face-to-face activities for this patient on the day of the visit. Professional time spent includes the following activities: Preparing to see the patient (review of tests), Obtaining and/or reviewing separately obtained history (admission/discharge record), Performing a medically appropriate examination and/or  evaluation , Ordering medications/tests/procedures, referring and communicating with other health care professionals, Documenting clinical information in the EMR, Independently interpreting results (not separately reported), Communicating results to the patient/family/caregiver, Counseling and educating the patient/family/caregiver and Care coordination (not separately reported).

## 2023-11-16 ENCOUNTER — Other Ambulatory Visit (HOSPITAL_COMMUNITY): Payer: Self-pay

## 2023-11-16 ENCOUNTER — Telehealth: Payer: Self-pay | Admitting: Pharmacy Technician

## 2023-11-16 LAB — CULTURE, BLOOD (ROUTINE X 2)
Culture: NO GROWTH
Culture: NO GROWTH
Special Requests: ADEQUATE
Special Requests: ADEQUATE

## 2023-11-16 NOTE — Telephone Encounter (Signed)
 Pharmacy Patient Advocate Encounter  Received notification from [facility] that Prior Authorization for Esomeprazole Magnesium 40MG  dr capsules has been DENIED.  Full denial letter will be uploaded to the media tab. See denial reason below.   PA #/Case ID/Reference #: [id number]

## 2023-11-16 NOTE — Telephone Encounter (Signed)
 Received message from [facility] requesting updated opat. "Our formulary is Micafungin/Mycamine. Can we get Dr. Drue Second to revise her OPAT order and also add dose/frequency?" Messaged Dr. Drue Second and pharmacy team for updated opat orders.  Will also have Dr. Drue Second fill out short stay orders for first dose.  Juanita Laster, RMA

## 2023-11-16 NOTE — Telephone Encounter (Signed)
 Pharmacy Patient Advocate Encounter   Received notification from CoverMyMeds that prior authorization for Esomeprazole Magnesium 40MG  dr capsules is required/requested.   Insurance verification completed.   The patient is insured through [facility] .   Per test claim: PA required; PA submitted to above mentioned insurance via CoverMyMeds Key/confirmation #/EOC [id number] Status is pending

## 2023-11-16 NOTE — Telephone Encounter (Signed)
 Updated OPAT: Discharge antibiotics to be given via PICC line Discharge antibiotics:   micafungin 150mg  iv x 1 for loading dose, then 100mg  iv daily  Duration: 14 days End Date:   [facility] Care Per Protocol:  Home health RN for IV administration and teaching; PICC line care and labs.     Labs weekly while on IV antibiotics: __x CBC with differential __ BMP _x_ CMP __ CRP __ ESR __ Vancomycin trough __ CK   _x_ Please pull PIC at completion of IV antibiotics   Fax weekly labs to [phone]

## 2023-11-16 NOTE — Telephone Encounter (Signed)
 [employer] confirmed HH should be able to do first dose at home.  Casey [name], RMA

## 2023-11-17 ENCOUNTER — Encounter: Payer: Self-pay | Admitting: Family

## 2023-11-17 ENCOUNTER — Ambulatory Visit (INDEPENDENT_AMBULATORY_CARE_PROVIDER_SITE_OTHER): Admitting: Family

## 2023-11-17 VITALS — BP 128/80 | HR 85 | Temp 97.7°F | Resp 16

## 2023-11-17 DIAGNOSIS — Z91038 Other insect allergy status: Secondary | ICD-10-CM

## 2023-11-17 NOTE — Telephone Encounter (Signed)
error 

## 2023-11-17 NOTE — Patient Instructions (Addendum)
 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  CPT codes given to call insurance to find out cost.  If interested in starting venom immunotherapy give our office a call - lab work on January 18, 2023 to honeybee, white face hornet, yellow jacket, paper wasp, and yellow hornet were negative - Have access to self-injectable epinephrine (Epipen) 0.3mg  at all times - Follow emergency action plan in case of allergic reaction -practice avoidance measures as outlined below when possible    Follow-up in 5 months or sooner if needed

## 2023-11-17 NOTE — Progress Notes (Signed)
 [address] [place] [address] Dept: [phone]  FOLLOW UP NOTE  Patient ID: Casey [name], female    DOB: [birth year]  Age: 53 y.o. MRN: [id number] Date of Office Visit: 11/17/2023  Assessment  Chief Complaint: Allergy Testing (Venom)  HPI Casey [name] is a 53 year old female who presents today for venom skin testing.  She was last seen on October 25, 2023 by Dr. Delorse Lek for allergic rhinitis with conjunctivitis, food allergy, hymenoptera allergy, and asthma.  She denies any new diagnosis or surgeries since her last office visit.  She reports that she is getting PICC line placement this coming Monday for esophageal candidiasis.  She reports that she has been off all antihistamines since Sunday.  She reports that she has not been stung in years.  When she was a kid she "almost went into anaphylaxis.".  She stated that she "completely swelled up."  She had to go to the emergency room.  She thinks that in her late 17s her skin test was positive.  She does not remember if she did any venom injections at that time.  She reports that she has an EpiPen.  Her lab work on January 18, 2023 was negative to honeybee, white faced hornet, yellow jacket, paper wasp, and yellow hornet.  Opportunity given to ask questions.  Informed consent signed.   Drug Allergies:  Allergies  Allergen Reactions   Albuterol Sulfate Shortness Of Breath    Other reaction(s): Other (See Comments)  can't breathe  Other reaction(s): Other (See Comments) can't breathe   Tape     Other reaction(s): Other (See Comments), Other (See Comments), Other (See Comments)  severe skin breakdown  Cast and Bandage Cover  severe skin breakdown  severe skin breakdown   Formoterol Other (See Comments)    Other reaction(s): Headaches  Other reaction(s): Headaches  Other reaction(s): Headaches  Headaches  Other reaction(s): Headaches Other reaction(s): Headaches    Other reaction(s): Headaches    Headaches   Hydrocodone  Rash   Hydrocodone-Acetaminophen Rash   Nickel Rash and Other (See Comments)   Other Rash    cast and bandage use  cast and bandage use  cast and bandage use  cast and bandage use    cast and bandage use cast and bandage use   Oxycodone-Acetaminophen Other (See Comments)    headache   Salmeterol Rash   Strawberry Extract Hives    Full body rash from strawberry   Advair Hfa [Fluticasone-Salmeterol]     unknown   Proventil Hfa [Albuterol]     unknown   Asa [Aspirin] Rash   Cleocin [Clindamycin] Rash   Clindamycin/Lincomycin Rash   Fludrocortisone Acetate Rash   Gabapentin Rash   Hydrochlorothiazide Rash   Imipramine Rash   Ipratropium Bromide Rash   Medroxyprogesterone Rash   Meloxicam Rash   Metformin And Related Rash   Nystatin Rash   Penicillins Rash   Pred Forte [Prednisolone] Rash   Prednisone Rash   Solu-Medrol [Methylprednisolone] Rash   Sulfa Antibiotics Rash   Tegaderm Ag Mesh [Silver] Rash   Toradol [Ketorolac Tromethamine] Rash   Tramadol Rash    Review of Systems: Negative except as per HPI   Physical Exam: BP 128/80   Pulse 85   Temp 97.7 F (36.5 C) (Temporal)   Resp 16   SpO2 99%    Physical Exam Constitutional:      Appearance: Normal appearance.  HENT:     Head: Normocephalic and atraumatic.  Comments: Pharynx normal, eyes normal, ears normal, nose normal    Right Ear: Tympanic membrane, ear canal and external ear normal.     Left Ear: Tympanic membrane, ear canal and external ear normal.     Nose: Nose normal.     Mouth/Throat:     Mouth: Mucous membranes are moist.     Pharynx: Oropharynx is clear.  Eyes:     Conjunctiva/sclera: Conjunctivae normal.  Cardiovascular:     Rate and Rhythm: Regular rhythm.     Heart sounds: Normal heart sounds.  Pulmonary:     Effort: Pulmonary effort is normal.     Breath sounds: Normal breath sounds.     Comments: Lungs clear to auscultation Musculoskeletal:     Cervical back: Neck supple.   Skin:    General: Skin is warm.     Comments: No rashes or urticarial lesions noted  Neurological:     Mental Status: She is alert and oriented to person, place, and time.  Psychiatric:        Mood and Affect: Mood normal.        Behavior: Behavior normal.        Thought Content: Thought content normal.        Judgment: Judgment normal.     Diagnostics: Skin testing today was positive to White hornet with adequate controls.   Venom Testing - 11/17/23 1127     Time Antigen Placed 0944    Allergen Manufacturer [employer]    Location Arm    Number of Test 27    Control Negative    Histamine --   6x7   Honey Bee Negative   Puncture 1.0 ug/ml   Yellow Jacket Negative   Puncture 1.0 ug/ml   Yellow Hornet Negative   Puncture 1.0 ug/ml   White Hornet Negative   Puncture 1.0 ug/ml   Wasp Negative   Puncture 1.0 ug/ml   Control Negative   Intradermal 0.001 ug/ml   Honey Bee Negative   Intradermal 0.001 ug/ml   Yellow Jacket Negative   Intradermal 0.001 ug/ml   Yellow Hornet Negative   Intradermal 0.001 ug/ml   White Hornet Negative   Intradermal 0.001 ug/ml   Wasp Negative   Intradermal 0.001 ug/ml   Honey Bee Negative   Intradermal 0.01 ug/ml   Yellow Jacket Negative   Intradermal 0.01 ug/ml   Yellow Hornet Negative   Intradermal 0.01 ug/ml   White Hornet Negative   Intradermal 0.01 ug/ml   Wasp Negative   Intradermal 0.01 ug/ml   Honey Bee Negative   Intradermal 0.1 ug/ml   Yellow Jacket Negative   Intradermal 0.1 ug/ml   Yellow Hornet Negative   Intradermal 0.1 ug/ml   White Hornet Negative   Intradermal 0.1 ug/ml   Wasp Negative   Intradermal 0.1 ug/ml   Honey Bee Negative   Intradermal 1.0 ug/ml   Yellow Jacket Negative   Intradermal 1.0 ug/ml   Yellow Hornet Negative   Intradermal 1.0 ug/ml   White Hornet --   Intradermal 1.0 ug/ml 6x7   Wasp Negative   Intradermal 1.0 ug/ml           During testing she complained of itching on her right inner arm, but no  erythema or rashes noted  Assessment and Plan: 1. Hymenoptera allergy     No orders of the defined types were placed in this encounter.   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  CPT codes given to call insurance to find out cost.  If interested in starting venom immunotherapy give our office a call - lab work on January 18, 2023 to honeybee, white face hornet, yellow jacket, paper wasp, and yellow hornet were negative - Have access to self-injectable epinephrine (Epipen) 0.3mg  at all times - Follow emergency action plan in case of allergic reaction -practice avoidance measures as outlined below when possible    Follow-up in 5 months or sooner if needed  Return in about 5 months (around 04/18/2024), or if symptoms worsen or fail to improve.    Thank you for the opportunity to care for this patient.  Please do not hesitate to contact me with questions.  Nehemiah Settle, FNP [facility]

## 2023-11-18 ENCOUNTER — Telehealth: Payer: Self-pay

## 2023-11-18 ENCOUNTER — Telehealth (HOSPITAL_BASED_OUTPATIENT_CLINIC_OR_DEPARTMENT_OTHER): Payer: Self-pay | Admitting: Student

## 2023-11-18 NOTE — Telephone Encounter (Signed)
 Patient dropped off document  Disability , to be filled out by provider. Patient requested to send it back via Fax within 5-days. Please advise at [facility] [phone]

## 2023-11-18 NOTE — Telephone Encounter (Signed)
 Received call from Jeri Modena, RN with [employer], patient has told [employer] that she wants to hold off on scheduling her first nurse visit until they can guarantee that they will be able to come out and do wound care for her every three days.   Pam is going to connect with the patient to explain ID's scope of treatment as it relates to the candidiasis.   Linna Hoff, BSN, RN

## 2023-11-18 NOTE — Telephone Encounter (Signed)
 Received the following message from Jeri Modena, RN with [employer]:  Casey So, RN circling back to let you know all is well. I spoke with Casey [name] for about 20 minutes. Lots of concerns and made no promises as she wanted assurance no clotting, no infection of PICC, etc.  She has long allergy list and states H/O DVT in past. Educated on measure we use/teach to prevent these and educated her on daily PICC site assessment. I did send her the video to watch which she is doing now and texting with questions.  I told her to try to be aware and self assess but not obsess [name] that she talked herself into issues. Hopefully she will do well at home with no issues.

## 2023-11-18 NOTE — Telephone Encounter (Signed)
 Received call from [place] with [employer] to confirm infusion pharmacy and treatment for benefits coordination.   Relayed that [facility] is infusion pharmacy and patient will be receiving micafungin 100 mg IV ([id number]).  Linna Hoff, BSN, RN

## 2023-11-19 ENCOUNTER — Encounter (HOSPITAL_BASED_OUTPATIENT_CLINIC_OR_DEPARTMENT_OTHER): Payer: Self-pay

## 2023-11-19 ENCOUNTER — Other Ambulatory Visit: Payer: Self-pay

## 2023-11-19 DIAGNOSIS — D72829 Elevated white blood cell count, unspecified: Secondary | ICD-10-CM | POA: Diagnosis not present

## 2023-11-19 DIAGNOSIS — Z79899 Other long term (current) drug therapy: Secondary | ICD-10-CM | POA: Diagnosis not present

## 2023-11-19 DIAGNOSIS — Z7984 Long term (current) use of oral hypoglycemic drugs: Secondary | ICD-10-CM | POA: Insufficient documentation

## 2023-11-19 DIAGNOSIS — E876 Hypokalemia: Secondary | ICD-10-CM | POA: Insufficient documentation

## 2023-11-19 DIAGNOSIS — Z7951 Long term (current) use of inhaled steroids: Secondary | ICD-10-CM | POA: Insufficient documentation

## 2023-11-19 DIAGNOSIS — I2510 Atherosclerotic heart disease of native coronary artery without angina pectoris: Secondary | ICD-10-CM | POA: Insufficient documentation

## 2023-11-19 DIAGNOSIS — R112 Nausea with vomiting, unspecified: Secondary | ICD-10-CM | POA: Diagnosis present

## 2023-11-19 DIAGNOSIS — J45909 Unspecified asthma, uncomplicated: Secondary | ICD-10-CM | POA: Insufficient documentation

## 2023-11-19 DIAGNOSIS — I10 Essential (primary) hypertension: Secondary | ICD-10-CM | POA: Insufficient documentation

## 2023-11-19 DIAGNOSIS — E119 Type 2 diabetes mellitus without complications: Secondary | ICD-10-CM | POA: Diagnosis not present

## 2023-11-19 DIAGNOSIS — B370 Candidal stomatitis: Secondary | ICD-10-CM | POA: Diagnosis not present

## 2023-11-19 LAB — COMPREHENSIVE METABOLIC PANEL WITH GFR
ALT: 15 U/L (ref 0–44)
AST: 16 U/L (ref 15–41)
Albumin: 4.3 g/dL (ref 3.5–5.0)
Alkaline Phosphatase: 93 U/L (ref 38–126)
Anion gap: 12 (ref 5–15)
BUN: 11 mg/dL (ref 6–20)
CO2: 23 mmol/L (ref 22–32)
Calcium: 9.7 mg/dL (ref 8.9–10.3)
Chloride: 103 mmol/L (ref 98–111)
Creatinine, Ser: 0.91 mg/dL (ref 0.44–1.00)
GFR, Estimated: >60 mL/min (ref >60)
Glucose, Bld: 120 mg/dL — ABNORMAL HIGH (ref 70–99)
Potassium: 3 mmol/L — ABNORMAL LOW (ref 3.5–5.1)
Sodium: 138 mmol/L (ref 135–145)
Total Bilirubin: 0.6 mg/dL (ref 0.0–1.2)
Total Protein: 8.5 g/dL — ABNORMAL HIGH (ref 6.5–8.1)

## 2023-11-19 LAB — CBC
HCT: 37 % (ref 36.0–46.0)
Hemoglobin: 12 g/dL (ref 12.0–15.0)
MCH: 25.7 pg — ABNORMAL LOW (ref 26.0–34.0)
MCHC: 32.4 g/dL (ref 30.0–36.0)
MCV: 79.2 fL — ABNORMAL LOW (ref 80.0–100.0)
Platelets: 422 10*3/uL — ABNORMAL HIGH (ref 150–400)
RBC: 4.67 MIL/uL (ref 3.87–5.11)
RDW: 15.4 % (ref 11.5–15.5)
WBC: 16.2 10*3/uL — ABNORMAL HIGH (ref 4.0–10.5)
nRBC: 0 % (ref 0.0–0.2)

## 2023-11-19 LAB — LIPASE, BLOOD: Lipase: <10 U/L — ABNORMAL LOW (ref 11–51)

## 2023-11-19 NOTE — ED Notes (Signed)
Pt does not want zofran at this time.

## 2023-11-19 NOTE — ED Triage Notes (Signed)
 Pt recently diagnosed with oral and esophageal candidiasis and has been vomiting x 2 days. PT states that she is concerned that she may be getting dehydrated.

## 2023-11-20 ENCOUNTER — Other Ambulatory Visit: Payer: Self-pay

## 2023-11-20 ENCOUNTER — Emergency Department (HOSPITAL_BASED_OUTPATIENT_CLINIC_OR_DEPARTMENT_OTHER)

## 2023-11-20 ENCOUNTER — Emergency Department (HOSPITAL_BASED_OUTPATIENT_CLINIC_OR_DEPARTMENT_OTHER)
Admission: EM | Admit: 2023-11-20 | Discharge: 2023-11-20 | Disposition: A | Discharge status: Home / Self Care | Attending: Emergency Medicine | Admitting: Emergency Medicine

## 2023-11-20 DIAGNOSIS — E876 Hypokalemia: Secondary | ICD-10-CM

## 2023-11-20 DIAGNOSIS — R112 Nausea with vomiting, unspecified: Secondary | ICD-10-CM

## 2023-11-20 DIAGNOSIS — B370 Candidal stomatitis: Secondary | ICD-10-CM

## 2023-11-20 MED ORDER — SODIUM CHLORIDE 0.9 % IV BOLUS
1000.0000 mL | INTRAVENOUS | Status: AC
Start: 2023-11-20 — End: 2023-11-20
  Administered 2023-11-20: 1000 mL via INTRAVENOUS

## 2023-11-20 MED ORDER — DROPERIDOL 2.5 MG/ML IJ SOLN
1.2500 mg | Freq: Once | INTRAMUSCULAR | Status: DC
  Filled 2023-11-20: qty 2

## 2023-11-20 NOTE — ED Notes (Signed)
 Pt report feeling better. "This bag of fluids is going to kept me better, until I get my PICC line on Monday". Palumbo MD notified.

## 2023-11-20 NOTE — ED Provider Notes (Signed)
 [place] EMERGENCY DEPARTMENT AT [facility] Provider Note   CSN: [id number] Arrival date & time: 11/19/23  2100     History  Chief Complaint  Patient presents with   Emesis    Casey [name] is a 53 y.o. female.  The history is provided by the patient.  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 Atopic dermatitis Formatting of this note might be different from the original. Formatting of this note might be different from the original. Atopic dermatitis Formatting of this note might be different from the original. Formatting of this note might be different from the original. Formatting of this note might be different from the or   Cataract 05/06/2015   Contusion of left wrist 07/08/2021   Coronary artery disease s/p PCI/DES to LAD 07/01/2023 12/28/2022   Crohn's disease (HCC) 04/21/2015   De Quervain's tenosynovitis, left 07/31/2021   Diabetes mellitus without complication (HCC)    Diabetes type 2, controlled (HCC) 04/21/2015   Dry eyes 05/06/2015   DVT (deep venous thrombosis)  (HCC)    Dysfunction of both eustachian tubes 08/31/2012   Encounter for monitoring immunomodulating therapy 11/20/2021   Environmental allergies 08/12/2020   Esophageal candidiasis (HCC) 06/26/2022   Essential hypertension 06/27/2015   Fibrositis 08/28/2014   Formatting of this note might be different from the original. Fibromyalgia Formatting of this note might be different from the original. Formatting of this note might be different from the original. Fibromyalgia   Flank pain 11/24/2020   Gastroesophageal reflux disease with esophagitis without hemorrhage 08/01/2019   Formatting of this note might be different from the original. Formatting of this note might be different from the original. 07/2019: chronic symptoms of esophageal reflux since prior to sleeve gastrectomy surgery. Symptoms have worsened over the last 6 months with recent EGD findings of grade B esophagitis, irregular z line, erythematous mucosa, and evidence of sleeve gastrectomy. 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 Formatting of this note might be different from the original. 07/2019: 8 mm colonic polyp removed during colonoscopy, biopsy showed serrated polyp. Repeat colonoscopy recommended in one year. Formatting of this note might be different from the original. 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 Hypertension Formatting of this note might be different from the original.  Formatting of this note might be different from the original. 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 Keratoconus Formatting of this note might be different from the original. Formatting of this note might be different from the original. Keratoconus Formatting of this note might be different from the original. Formatting of this note might be different from the original. Keratoconus Formatting of this note might be different from the or   Microhematuria 06/27/2015   Migraine 08/28/2014   Formatting of this note might be different from the original. Migraine Formatting of this note might be different from the original. Formatting of this note might be different from the original. Migraine Formatting of this note might be different from the original. Migraine Formatting of this note might be different from the original. Migraine Formatting of this note might be different from the or   Migraine without aura and without status migrainosus, not intractable 08/28/2014   Formatting of this note might be different from the original. Formatting of this note might be different from the original. Formatting of this note might be different from the original. Migraine Formatting of this note might be different from the original. Migraine Formatting of this note might be different from the original. Formatting of this note might be different from the original. Migraine F   Mild intermittent asthma without complication 10/30/2008   Formatting of this note might be different from the original. Formatting of this note might be different from the original. Formatting of this note might be different from the original. Asthma Formatting of this note might be different from the original. Asthma Formatting of this note might be different from the  original. Formatting of this note might be different from the original. 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 Formatting of this note might be different from the original. 01/25/2016 with Dr. Durrell Gilles at [facility]. [place] of this note might be different from the original. 01/25/2016 with Dr. Durrell Gilles at [facility]. [place] of this note might be different from the original. Formatting of this note might be different from    Seronegative inflammatory arthritis 08/28/2014   Formatting of this note might be different from the original. Formatting of this note might be different from the original. Arthritis; diagnosed as IBD related - 2012 Formatting of this note might be different from the original. Followed by rheumatologist. Had to change providers due to an insurance change earlier this year resulting in patient not being able to have Remicade for ~6 months.  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 Formatting of this note might be different from the original. Treated with PRN miralax. Recent colonoscopy 07/2019. Formatting of this note might be different from the original. Treated with PRN miralax. Recent  colonoscopy 07/2019. Formatting of this note might be different from the original. Formatting of this note might be different f   Status post placement of ureteral stent 02/06/2021   Tooth infection 08/12/2020   Unstable angina (HCC) 07/29/2023   Ureteral stone 01/20/2022   Urinary tract infection, site not specified 06/26/2022     Home Medications Prior to Admission medications   Medication Sig Start Date End Date Taking? Authorizing Provider  acetaminophen (TYLENOL) 500 MG tablet Take 1,000 mg by mouth every 12 (twelve) hours as needed for mild pain or moderate pain.    [provider]  albuterol (PROVENTIL) (2.5 MG/3ML) 0.083% nebulizer solution Take 3 mLs (2.5 mg total) by nebulization every 6 (six) hours as needed for wheezing or shortness of breath. 10/13/23   Everlyn Hockey, PA-C  amLODipine (NORVASC) 10 MG tablet Take 1 tablet (10 mg total) by mouth daily. 08/08/23 11/09/23  Krasowski, Robert J, MD  atorvastatin (LIPITOR) 80 MG tablet Take 1 tablet (80 mg total) by mouth daily. 09/19/23   Krasowski, Robert J, MD  B Complex-C (B-COMPLEX WITH VITAMIN C) tablet Take 1 tablet by mouth daily. 09/06/19   [provider]  Cholecalciferol (VITAMIN D3) 50 MCG (2000 UT) TABS Take 1 tablet by mouth daily.    [provider]  EPINEPHrine (EPIPEN 2-PAK) 0.3 mg/0.3 mL IJ SOAJ injection Inject 0.3 mg into the muscle as needed for anaphylaxis. 01/18/23   Brian Campanile, MD  esomeprazole (NEXIUM) 40 MG capsule Take 1 capsule (40 mg total) by mouth 2 (two) times daily before a meal. Patient taking differently: Take 80 mg by mouth daily. 11/30/22   Adra Alanis, FNP  famotidine (PEPCID) 40 MG tablet Take 1 tablet (40 mg total) by mouth daily. 11/30/22   Adra Alanis, FNP  Golimumab [facility] ARIA IV) Inject 200 mg into the vein every 8 (eight) weeks.    [provider]  JARDIANCE 25 MG TABS tablet Take 25 mg by mouth daily. 11/03/23 11/02/24   [provider]  levocetirizine (XYZAL) 5 MG tablet Take 10 mg by mouth daily.    [provider]  lubiprostone (AMITIZA) 8 MCG capsule Take 8 mcg by mouth daily with breakfast. 10/07/23 04/04/24  [provider]  mirabegron ER (MYRBETRIQ) 50 MG TB24 tablet Take 1 tablet (50 mg total) by mouth daily. 11/30/22   Adra Alanis, FNP  montelukast (SINGULAIR) 10 MG tablet Take 1 tablet (10 mg total) by mouth at bedtime. Patient taking differently: Take 10 mg by mouth daily. 11/30/22   Adra Alanis, FNP  nitroGLYCERIN (NITROSTAT) 0.4 MG SL tablet Place 1 tablet (0.4 mg total) under the tongue every 5 (five) minutes as needed for chest pain. 10/28/23   Tolia, Sunit, DO  oxyCODONE (OXY IR/ROXICODONE) 5 MG immediate release tablet Take 5 mg by mouth as needed for moderate pain (pain score 4-6) or severe pain (pain score 7-10). Very rarely    [provider]  ticagrelor (BRILINTA) 90 MG TABS tablet Take 1 tablet (90 mg total) by mouth 2 (two) times daily. 07/01/23   Abbe Abate, MD  Allergies    Albuterol sulfate, Tape, Formoterol, Hydrocodone, Hydrocodone-acetaminophen, Nickel, Other, Oxycodone-acetaminophen, Salmeterol, Strawberry extract, Advair hfa [fluticasone-salmeterol], Proventil hfa [albuterol], Asa [aspirin], Cleocin [clindamycin], Clindamycin/lincomycin, Fludrocortisone acetate, Gabapentin, Hydrochlorothiazide, Imipramine, Ipratropium bromide, Medroxyprogesterone, Meloxicam, Metformin and related, Nystatin, Penicillins, Pred forte [prednisolone], Prednisone, Solu-medrol [methylprednisolone], Sulfa antibiotics, Tegaderm ag mesh [silver], Toradol [ketorolac tromethamine], and Tramadol    Review of Systems   Review of Systems  Constitutional:  Negative for fever.  HENT:  Negative for facial swelling and sore throat.   Eyes:  Negative for photophobia.  Respiratory:  Negative for cough.   Gastrointestinal:  Positive for vomiting. Negative  for abdominal pain.  All other systems reviewed and are negative.   Physical Exam Updated Vital Signs BP 136/85 (BP Location: Right Arm)   Pulse 80   Temp 98.2 F (36.8 C) (Oral)   Resp 15   SpO2 99%  Physical Exam Vitals and nursing note reviewed. Exam conducted with a chaperone present.  Constitutional:      General: She is not in acute distress.    Appearance: Normal appearance. She is well-developed.  HENT:     Head: Normocephalic and atraumatic.     Nose: Nose normal.     Mouth/Throat:     Mouth: Mucous membranes are moist.     Comments: candidiasis of the tongue Eyes:     Pupils: Pupils are equal, round, and reactive to light.  Cardiovascular:     Rate and Rhythm: Normal rate and regular rhythm.     Pulses: Normal pulses.     Heart sounds: Normal heart sounds.  Pulmonary:     Effort: Pulmonary effort is normal. No respiratory distress.     Breath sounds: Normal breath sounds.  Abdominal:     General: Bowel sounds are normal. There is no distension.     Palpations: Abdomen is soft.     Tenderness: There is no abdominal tenderness. There is no guarding or rebound.  Musculoskeletal:        General: Normal range of motion.     Cervical back: Normal range of motion and neck supple.  Skin:    General: Skin is dry.     Capillary Refill: Capillary refill takes less than 2 seconds.     Findings: No erythema or rash.  Neurological:     General: No focal deficit present.     Mental Status: She is alert and oriented to person, place, and time.     Deep Tendon Reflexes: Reflexes normal.  Psychiatric:        Mood and Affect: Mood normal.     ED Results / Procedures / Treatments   Labs (all labs ordered are listed, but only abnormal results are displayed) Results for orders placed or performed during the hospital encounter of 11/20/23  Lipase, blood   Collection Time: 11/19/23  9:27 PM  Result Value Ref Range   Lipase <10 (L) 11 - 51 U/L  Comprehensive metabolic  panel   Collection Time: 11/19/23  9:27 PM  Result Value Ref Range   Sodium 138 135 - 145 mmol/L   Potassium 3.0 (L) 3.5 - 5.1 mmol/L   Chloride 103 98 - 111 mmol/L   CO2 23 22 - 32 mmol/L   Glucose, Bld 120 (H) 70 - 99 mg/dL   BUN 11 6 - 20 mg/dL   Creatinine, Ser 1.61 0.44 - 1.00 mg/dL   Calcium 9.7 8.9 - 09.6 mg/dL   Total Protein 8.5 (H) 6.5 - 8.1 g/dL  Albumin 4.3 3.5 - 5.0 g/dL   AST 16 15 - 41 U/L   ALT 15 0 - 44 U/L   Alkaline Phosphatase 93 38 - 126 U/L   Total Bilirubin 0.6 0.0 - 1.2 mg/dL   GFR, Estimated >30 >86 mL/min   Anion gap 12 5 - 15  CBC   Collection Time: 11/19/23  9:27 PM  Result Value Ref Range   WBC 16.2 (H) 4.0 - 10.5 K/uL   RBC 4.67 3.87 - 5.11 MIL/uL   Hemoglobin 12.0 12.0 - 15.0 g/dL   HCT 57.8 46.9 - 62.9 %   MCV 79.2 (L) 80.0 - 100.0 fL   MCH 25.7 (L) 26.0 - 34.0 pg   MCHC 32.4 30.0 - 36.0 g/dL   RDW 52.8 41.3 - 24.4 %   Platelets 422 (H) 150 - 400 K/uL   nRBC 0.0 0.0 - 0.2 %   DG Chest Portable 1 View Result Date: 11/20/2023 CLINICAL DATA:  Vomiting for 2 days EXAM: PORTABLE CHEST 1 VIEW COMPARISON:  11/10/2023 FINDINGS: Cardiac shadow is enlarged but stable. Lungs are well aerated without focal infiltrate or effusion. No bony abnormality is noted. IMPRESSION: No acute abnormality noted. Electronically Signed   By: Violeta Grey M.D.   On: 11/20/2023 03:25   LONG TERM MONITOR (3-14 DAYS) Result Date: 11/13/2023 Cardiac monitor [facility] [employer]): March 2025 Dominant rhythm sinus. Heart rate 59-139 bpm.  Avg HR 87 bpm. No atrial fibrillation detected during the monitoring period. No high grade AV block, pauses (3 seconds or longer). 1 asymptomatic episode of NSVT, 11/03/2023 at 6:33 AM, 8 beats, 4 seconds, average heart rate 126 bpm, max heart rate 139 bpm. Total supraventricular ectopic burden <1%. Total ventricular ectopic burden <1%. Patient triggered events: 21.  Underlying rhythm either sinus or sinus tachycardia with no sustained arrhythmia.    MR BRAIN WO CONTRAST Result Date: 11/11/2023 CLINICAL DATA:  New onset headache EXAM: MRI HEAD WITHOUT CONTRAST TECHNIQUE: Multiplanar, multiecho pulse sequences of the brain and surrounding structures were obtained without intravenous contrast. COMPARISON:  08/12/2022 FINDINGS: Brain: No acute infarct, mass effect or extra-axial collection. No acute or chronic hemorrhage. Normal white matter signal, parenchymal volume and CSF spaces. The midline structures are normal. Vascular: Normal flow voids. Skull and upper cervical spine: Normal calvarium and skull base. Visualized upper cervical spine and soft tissues are normal. Sinuses/Orbits:No paranasal sinus fluid levels or advanced mucosal thickening. No mastoid or middle ear effusion. Normal orbits. IMPRESSION: Normal brain MRI. Electronically Signed   By: Juanetta Nordmann M.D.   On: 11/11/2023 00:37   DG Chest Port 1 View Result Date: 11/10/2023 CLINICAL DATA:  Weakness, blurred vision, photophobia EXAM: PORTABLE CHEST 1 VIEW COMPARISON:  10/16/2023 FINDINGS: The heart size and mediastinal contours are within normal limits. Both lungs are clear. The visualized skeletal structures are unremarkable. IMPRESSION: No active disease. Electronically Signed   By: Bobbye Burrow M.D.   On: 11/10/2023 22:41   CT Head Wo Contrast Result Date: 11/10/2023 CLINICAL DATA:  Headache, new onset (Age >= 51y) EXAM: CT HEAD WITHOUT CONTRAST TECHNIQUE: Contiguous axial images were obtained from the base of the skull through the vertex without intravenous contrast. RADIATION DOSE REDUCTION: This exam was performed according to the departmental dose-optimization program which includes automated exposure control, adjustment of the mA and/or kV according to patient size and/or use of iterative reconstruction technique. COMPARISON:  None Available. FINDINGS: Brain: Normal anatomic configuration. No abnormal intra or extra-axial mass lesion or fluid collection. No  abnormal mass effect or  midline shift. No evidence of acute intracranial hemorrhage or infarct. Ventricular size is normal. Cerebellum unremarkable. Vascular: Unremarkable Skull: Intact Sinuses/Orbits: Paranasal sinuses are clear. Orbits are unremarkable. Other: Mastoid air cells and middle ear cavities are clear. IMPRESSION: 1. No acute intracranial abnormality. Electronically Signed   By: Worthy Heads M.D.   On: 11/10/2023 21:46   US  RENAL Result Date: 10/26/2023 CLINICAL DATA:  Proteinuria. EXAM: RENAL / URINARY TRACT ULTRASOUND COMPLETE COMPARISON:  June 05, 2021 FINDINGS: Right Kidney: Renal measurements: 9.1 x 4.8 x 4.2 cm = volume: 96.1 mL. Echogenicity within normal limits. No mass or hydronephrosis visualized. Left Kidney: Renal measurements: 9.1 x 4.2 x 4.6 cm = volume: 93 mL. Echogenicity within normal limits. No mass or hydronephrosis visualized. Bladder: Ultrasound technologist reports not visualized. Other: None. IMPRESSION: Normal renal ultrasound. Electronically Signed   By: Anna Barnes M.D.   On: 10/26/2023 11:50   XR Foot 2 Views Left Result Date: 10/24/2023 PIP and DIP narrowing was noted.  No MTP, intertarsal, tibiotalar or subtalar joint space narrowing was noted.  No erosive changes were noted.  Posterior calcaneal spur was noted. Impression: These findings suggestive of osteoarthritis of the foot.  XR Foot 2 Views Right Result Date: 10/24/2023 PIP and DIP narrowing was noted.  No MTP, intertarsal, tibiotalar or subtalar joint space narrowing was noted.  No erosive changes were noted.  Small posterior calcaneal spur was noted. Impression: These findings are suggestive of osteoarthritis of the foot.  XR Hand 2 View Left Result Date: 10/24/2023 CMC, PIP and DIP narrowing was noted.  No MCP, intercarpal or radiocarpal joint space narrowing was noted.  No erosive changes were noted. Impression: These findings with history of osteoarthritis of the hand.  XR Hand 2 View Right Result Date:  10/24/2023 CMC, PIP and DIP narrowing was noted.  No MCP, intercarpal or radiocarpal joint space narrowing was noted.  No erosive changes were noted. Impression: These findings with history of osteoarthritis of the hand.   Radiology DG Chest Portable 1 View Result Date: 11/20/2023 CLINICAL DATA:  Vomiting for 2 days EXAM: PORTABLE CHEST 1 VIEW COMPARISON:  11/10/2023 FINDINGS: Cardiac shadow is enlarged but stable. Lungs are well aerated without focal infiltrate or effusion. No bony abnormality is noted. IMPRESSION: No acute abnormality noted. Electronically Signed   By: Violeta Grey M.D.   On: 11/20/2023 03:25    Procedures Procedures    Medications Ordered in ED Medications  droperidol (INAPSINE) 2.5 MG/ML injection 1.25 mg (1.25 mg Intravenous Not Given 11/20/23 0312)  sodium chloride 0.9 % bolus 1,000 mL (0 mLs Intravenous Stopped 11/20/23 0359)    ED Course/ Medical Decision Making/ A&P                                 Medical Decision Making Patient with known recurrent oral and esophageal candidiasis presents with recurrence, awaiting picc line Monday and would like IVF  Amount and/or Complexity of Data Reviewed External Data Reviewed: notes.    Details: Previous notes reviewed  Labs: ordered.    Details: White count slight elevation 16.2, normal hemoglobin 12, platelets elevated 422/ Normal sodium 138, potassium slight low 3, normal creatinine 0.91, normal LFTs lipase is normal < 10  Radiology: ordered and independent interpretation performed.    Details: No acute finding  ECG/medicine tests: ordered and independent interpretation performed. Decision-making details documented in ED Course.  Details: See muse   Risk Prescription drug management. Risk Details: Patient refused droperidol ordered for her vomiting.  Received IV Fluids and felt improved and wanted to go home.  Stable for discharge.  Strict returns.       Final Clinical Impression(s) / ED Diagnoses Final  diagnoses:  Nausea and vomiting, unspecified vomiting type  Oral candidiasis   No signs of systemic illness or infection. The patient is nontoxic-appearing on exam and vital signs are within normal limits.  I have reviewed the triage vital signs and the nursing notes. Pertinent labs & imaging results that were available during my care of the patient were reviewed by me and considered in my medical decision making (see chart for details). After history, exam, and medical workup I feel the patient has been appropriately medically screened and is safe for discharge home. Pertinent diagnoses were discussed with the patient. Patient was given return precautions.    Rx / DC Orders ED Discharge Orders     None         Jawara Latorre, MD 11/20/23 1610

## 2023-11-21 ENCOUNTER — Ambulatory Visit (HOSPITAL_COMMUNITY)
Admission: RE | Admit: 2023-11-21 | Discharge: 2023-11-21 | Disposition: A | Discharge status: Home / Self Care | Source: Ambulatory Visit | Attending: Internal Medicine | Admitting: Internal Medicine

## 2023-11-21 ENCOUNTER — Telehealth: Payer: Self-pay

## 2023-11-21 ENCOUNTER — Ambulatory Visit: Payer: 59 | Admitting: Rheumatology

## 2023-11-21 DIAGNOSIS — B3781 Candidal esophagitis: Secondary | ICD-10-CM | POA: Insufficient documentation

## 2023-11-21 MED ORDER — LIDOCAINE HCL 1 % IJ SOLN
20.0000 mL | Freq: Once | INTRAMUSCULAR | Status: AC
  Administered 2023-11-21: 10 mL

## 2023-11-21 MED ORDER — HEPARIN SOD (PORK) LOCK FLUSH 100 UNIT/ML IV SOLN
INTRAVENOUS | Status: AC
Start: 2023-11-21 — End: ?
  Filled 2023-11-21: qty 5

## 2023-11-21 MED ORDER — LIDOCAINE HCL 1 % IJ SOLN
INTRAMUSCULAR | Status: AC
  Filled 2023-11-21: qty 20

## 2023-11-21 NOTE — Telephone Encounter (Signed)
 Received call from Kay Parson, RN with [employer] needing verbal orders for loading dose of IV micafungin.  Relayed verbal orders for IV micafungin 150 mg for first dose per Dr. Alva Auer 11/15/23 note.   Jaidon Sponsel, BSN, RN

## 2023-11-21 NOTE — Procedures (Signed)
 Successful placement of a single lumen PICC line to right brachial vein. Length 42 cm Tip at lower SVC/RA PICC capped No complications Ready for use.  EBL < 5 mL   Larosa Rhines N Iam Lipson PA-C 11/21/2023 11:10 AM

## 2023-11-22 ENCOUNTER — Ambulatory Visit (INDEPENDENT_AMBULATORY_CARE_PROVIDER_SITE_OTHER): Payer: Self-pay

## 2023-11-22 DIAGNOSIS — J309 Allergic rhinitis, unspecified: Secondary | ICD-10-CM

## 2023-11-24 ENCOUNTER — Ambulatory Visit (INDEPENDENT_AMBULATORY_CARE_PROVIDER_SITE_OTHER): Admitting: *Deleted

## 2023-11-24 DIAGNOSIS — J309 Allergic rhinitis, unspecified: Secondary | ICD-10-CM

## 2023-11-26 ENCOUNTER — Emergency Department (HOSPITAL_COMMUNITY)
Admission: EM | Admit: 2023-11-26 | Discharge: 2023-11-26 | Disposition: A | Discharge status: Home / Self Care | Attending: Emergency Medicine | Admitting: Emergency Medicine

## 2023-11-26 ENCOUNTER — Encounter (HOSPITAL_COMMUNITY): Payer: Self-pay | Admitting: Emergency Medicine

## 2023-11-26 ENCOUNTER — Emergency Department (HOSPITAL_COMMUNITY)

## 2023-11-26 DIAGNOSIS — M7701 Medial epicondylitis, right elbow: Secondary | ICD-10-CM | POA: Insufficient documentation

## 2023-11-26 DIAGNOSIS — M25521 Pain in right elbow: Secondary | ICD-10-CM | POA: Diagnosis present

## 2023-11-26 MED ORDER — HEPARIN SOD (PORK) LOCK FLUSH 100 UNIT/ML IV SOLN
250.0000 [IU] | INTRAVENOUS | Status: AC | PRN
  Administered 2023-11-26: 250 [IU]

## 2023-11-26 NOTE — Discharge Instructions (Signed)
 You were seen in the emergency department today for concerns of more pain.  He had x-ray to confirm the placement of your PICC line and also had the IV team evaluated PICC line which appears to be fully functioning in adequate position.  Your symptoms are likely consistent with epicondylitis.  I would take Tylenol  for pain and keep ice over the area as needed for pain relief.  Please continue all other medications using your PICC line as prescribed.  For any concerns or new or worsening symptoms, return to the emergency department.

## 2023-11-26 NOTE — ED Provider Notes (Signed)
 [place] EMERGENCY DEPARTMENT AT [facility] Provider Note   CSN: [id number] Arrival date & time: 11/26/23  1631     History Chief Complaint  Patient presents with   Arm Pain    Casey [name] is a 53 y.o. female.  Patient presents to the emergency department with concerns of PICC line complications.  She reports that she has been having about a day of worsening pain at the area of her PICC line insertion in the right elbow primarily when bending the elbow.  She is concerned for possible malfunctioning of her PICC line or a clot of the arm.  She states that she is currently using the PICC line for antifungal therapy for esophageal candidiasis.  Denies any complications with administration of her medications.  States that she is able to flush medications and heparin  through her PICC line without difficulty.   Arm Pain       Home Medications Prior to Admission medications   Medication Sig Start Date End Date Taking? Authorizing Provider  acetaminophen  (TYLENOL ) 500 MG tablet Take 1,000 mg by mouth every 12 (twelve) hours as needed for mild pain or moderate pain.    [provider]  albuterol  (PROVENTIL ) (2.5 MG/3ML) 0.083% nebulizer solution Take 3 mLs (2.5 mg total) by nebulization every 6 (six) hours as needed for wheezing or shortness of breath. 10/13/23   Everlyn Hockey, PA-C  amLODipine  (NORVASC ) 10 MG tablet Take 1 tablet (10 mg total) by mouth daily. 08/08/23 11/09/23  Krasowski, Robert J, MD  atorvastatin  (LIPITOR) 80 MG tablet Take 1 tablet (80 mg total) by mouth daily. 09/19/23   Krasowski, Robert J, MD  B Complex-C (B-COMPLEX WITH VITAMIN C) tablet Take 1 tablet by mouth daily. 09/06/19   [provider]  Cholecalciferol  (VITAMIN D3) 50 MCG (2000 UT) TABS Take 1 tablet by mouth daily.    [provider]  EPINEPHrine  (EPIPEN  2-PAK) 0.3 mg/0.3 mL IJ SOAJ injection Inject 0.3 mg into the muscle as needed for anaphylaxis. 01/18/23   Brian Campanile, MD  esomeprazole  (NEXIUM ) 40 MG capsule Take 1 capsule (40 mg total) by mouth 2 (two) times daily before a meal. Patient taking differently: Take 80 mg by mouth daily. 11/30/22   Adra Alanis, FNP  famotidine  (PEPCID ) 40 MG tablet Take 1 tablet (40 mg total) by mouth daily. 11/30/22   Adra Alanis, FNP  Golimumab [facility] ARIA IV) Inject 200 mg into the vein every 8 (eight) weeks.    [provider]  JARDIANCE 25 MG TABS tablet Take 25 mg by mouth daily. 11/03/23 11/02/24  [provider]  levocetirizine (XYZAL) 5 MG tablet Take 10 mg by mouth daily.    [provider]  lubiprostone (AMITIZA) 8 MCG capsule Take 8 mcg by mouth daily with breakfast. 10/07/23 04/04/24  [provider]  mirabegron  ER (MYRBETRIQ ) 50 MG TB24 tablet Take 1 tablet (50 mg total) by mouth daily. 11/30/22   Adra Alanis, FNP  montelukast  (SINGULAIR ) 10 MG tablet Take 1 tablet (10 mg total) by mouth at bedtime. Patient taking differently: Take 10 mg by mouth daily. 11/30/22   Adra Alanis, FNP  nitroGLYCERIN  (NITROSTAT ) 0.4 MG SL tablet Place 1 tablet (0.4 mg total) under the tongue every 5 (five) minutes as needed for chest pain. 10/28/23   Tolia, Sunit, DO  oxyCODONE  (OXY IR/ROXICODONE ) 5 MG immediate release tablet Take 5 mg by mouth as needed for moderate pain (pain score 4-6)  or severe pain (pain score 7-10). Very rarely    [provider]  ticagrelor  (BRILINTA ) 90 MG TABS tablet Take 1 tablet (90 mg total) by mouth 2 (two) times daily. 07/01/23   Abbe Abate, MD      Allergies    Albuterol  sulfate, Tape, Formoterol, Hydrocodone , Hydrocodone -acetaminophen , Nickel, Other, Oxycodone -acetaminophen , Salmeterol, Strawberry extract, Advair hfa [fluticasone-salmeterol], Proventil  hfa [albuterol ], Asa [aspirin ], Cleocin [clindamycin], Clindamycin/lincomycin, Fludrocortisone acetate, Gabapentin, Hydrochlorothiazide, Imipramine,  Ipratropium bromide, Medroxyprogesterone, Meloxicam, Metformin and related, Nystatin, Penicillins, Pred forte [prednisolone], Prednisone, Solu-medrol  [methylprednisolone ], Sulfa antibiotics, Tegaderm ag mesh [silver], Toradol  [ketorolac  tromethamine ], and Tramadol    Review of Systems   Review of Systems  All other systems reviewed and are negative.   Physical Exam Updated Vital Signs BP (!) 145/100 (BP Location: Left Arm)   Pulse (!) 102   Temp (!) 97.3 F (36.3 C)   Resp 16   SpO2 99%  Physical Exam Vitals and nursing note reviewed.  Constitutional:      General: She is not in acute distress.    Appearance: She is well-developed.  HENT:     Head: Normocephalic and atraumatic.  Eyes:     Conjunctiva/sclera: Conjunctivae normal.  Cardiovascular:     Rate and Rhythm: Normal rate and regular rhythm.     Heart sounds: No murmur heard. Pulmonary:     Effort: Pulmonary effort is normal. No respiratory distress.     Breath sounds: Normal breath sounds.  Abdominal:     Palpations: Abdomen is soft.     Tenderness: There is no abdominal tenderness.  Musculoskeletal:        General: Tenderness present. No swelling.       Arms:     Cervical back: Neck supple.     Comments: Patient present with tenderness palpation in the lateral and medial epicondyles.  Tenderness primarily seen in the medial epicondyle.  No obvious swelling or deformity.  No bruising seen.  Skin:    General: Skin is warm and dry.     Capillary Refill: Capillary refill takes less than 2 seconds.  Neurological:     Mental Status: She is alert.  Psychiatric:        Mood and Affect: Mood normal.     ED Results / Procedures / Treatments   Labs (all labs ordered are listed, but only abnormal results are displayed) Labs Reviewed - No data to display  EKG None  Radiology DG Chest Portable 1 View Result Date: 11/26/2023 CLINICAL DATA:  picc line placement EXAM: PORTABLE CHEST - 1 VIEW COMPARISON:  11/20/2023  FINDINGS: Lungs clear.  Right arm PICC to the cavoatrial junction. Heart size and mediastinal contours are within normal limits. No effusion. Visualized bones unremarkable. IMPRESSION: Right arm PICC to the cavoatrial junction. Electronically Signed   By: Nicoletta Barrier M.D.   On: 11/26/2023 19:31    Procedures Procedures    Medications Ordered in ED Medications  heparin  lock flush 100 unit/mL (250 Units Intracatheter Given 11/26/23 2048)    ED Course/ Medical Decision Making/ A&P                                Medical Decision Making  This patient presents to the ED for concern of elbow pain. Differential diagnosis includes PICC line complications, epicondylitis, tendinitis, elbow dislocation   Imaging Studies ordered:  I ordered imaging studies including x-ray of the chest I independently visualized and interpreted  imaging which showed PICC line in proper placement I agree with the radiologist interpretation   Problem List / ED Course:  Patient presents the emergency room today with concerns of right arm pain.  She reports she had a PICC line placed about 1 week ago for IV antifungal therapy for esophageal candidiasis.  She states that she is concerned for possible complications with her PICC line as she is having some pain primarily over the medial and lateral epicondyles.  No history of similar symptoms without inciting factors. She does report that she was previously being treated for tendinitis of the elbow.  Denies any recent fever, chills, body aches, redness, or warmth to the right elbow. Physical exam reveals tenderness to palpation over the medial and lateral epicondyles of the right elbow.  No significant deformity seen.  Neurovascularly intact.  PICC line appears to be in place with no obvious drainage or bleeding seen.  Will obtain x-ray for PICC line placement and also will consult IV team for evaluation of PICC line.  No indication for imaging as there is no traumatic  findings to the right elbow.  No concern for septic joint. X-ray shows proper placement of PICC line.  IV team is able to flush PICC line without any difficulties.  Informed patient this is likely epicondylitis of the medial elbow.  Encouraged continued management with Tylenol  she is unable to take anti-inflammatories.  Advise use of a cool compress such as ice over the painful areas to help with any discomfort.  Encourage close follow-up with primary care provider for further assessment.  Return precautions discussed.  Patient otherwise stable discharged home for outpatient follow-up.  Final Clinical Impression(s) / ED Diagnoses Final diagnoses:  Medial epicondylitis of right elbow    Rx / DC Orders ED Discharge Orders     None         Kaysee Hergert A, PA-C 11/26/23 2107    Deatra Face, MD 11/27/23 1110

## 2023-11-26 NOTE — ED Provider Triage Note (Signed)
 Emergency Medicine Provider Triage Evaluation Note  Casey [name] , a 53 y.o. female  was evaluated in triage.  Pt complains of PICC line concerns.  Patient states that she began experience pain in her right arm towards the elbow just inferior to the PICC line site.  States that she had a PICC line placed on Monday earlier this week and pain did not start till yesterday.  She reports that pain typically worsens with flexion and extension of the right elbow.  Denies any recent injury or trauma to the area.  Review of Systems  Positive: As above Negative: As above  Physical Exam  BP (!) 145/100 (BP Location: Left Arm)   Pulse (!) 102   Temp (!) 97.3 F (36.3 C)   Resp 16   SpO2 99%  Gen:   Awake, no distress   Resp:  Normal effort  MSK:   Pain with flexion extension of the right elbow.  Tenderness palpation at the medial and lateral epicondyles.   Other:    Medical Decision Making  Medically screening exam initiated at 5:11 PM.  Appropriate orders placed.  Casey [name] was informed that the remainder of the evaluation will be completed by another provider, this initial triage assessment does not replace that evaluation, and the importance of remaining in the ED until their evaluation is complete.  IV team consult placed for PICC line evaluation.   Sherrice Creekmore A, PA-C 11/26/23 1712

## 2023-11-26 NOTE — ED Triage Notes (Signed)
 Pt here from home with pain to her picc line site on her right arm , hurts worse when she bends it, PICC line looks to be intact

## 2023-11-26 NOTE — Progress Notes (Signed)
 PICC line previous dressing was intact and dry,dressing changed and site assessed, no redness, no swelling on insertion site, flushed w/ good blood return.heparin  instilled for  possible discharge.PA notified.

## 2023-11-26 NOTE — ED Notes (Signed)
Pt called X2 for vitals recheck. Pt could not be found.  

## 2023-11-26 NOTE — ED Notes (Signed)
 No repeat vitals pt would not let me  even read the discharge instructions she took them from my hand and had already taken off her bracelet

## 2023-11-28 ENCOUNTER — Emergency Department (HOSPITAL_COMMUNITY)
Admission: EM | Admit: 2023-11-28 | Discharge: 2023-11-29 | Discharge status: Against Medical Advice | Attending: Emergency Medicine | Admitting: Emergency Medicine

## 2023-11-28 ENCOUNTER — Telehealth: Payer: Self-pay

## 2023-11-28 ENCOUNTER — Ambulatory Visit (INDEPENDENT_AMBULATORY_CARE_PROVIDER_SITE_OTHER): Admitting: *Deleted

## 2023-11-28 DIAGNOSIS — T7840XA Allergy, unspecified, initial encounter: Secondary | ICD-10-CM | POA: Diagnosis present

## 2023-11-28 DIAGNOSIS — J309 Allergic rhinitis, unspecified: Secondary | ICD-10-CM | POA: Diagnosis not present

## 2023-11-28 DIAGNOSIS — Z5321 Procedure and treatment not carried out due to patient leaving prior to being seen by health care provider: Secondary | ICD-10-CM | POA: Insufficient documentation

## 2023-11-28 MED ORDER — DIPHENHYDRAMINE HCL 25 MG PO CAPS
25.0000 mg | ORAL_CAPSULE | Freq: Once | ORAL | Status: AC
  Administered 2023-11-28: 25 mg via ORAL
  Filled 2023-11-28: qty 1

## 2023-11-28 NOTE — Telephone Encounter (Signed)
 Received the following message from [employer];   Hey team.   Just letting you know Mrs. Rahrig called the [facility] Care RN Friday PM stating her PICC line was swollen and hurting and that we was headed to the ED.  Here is a portion of one of the RN notes in EPIC.  Pt was discharged home and no issues were found by the ED team.  We will continue to follow.    Pam   ED visit note  Arvella Bird, RN  Registered Nurse  VAS/IV Team    Progress Notes   This note has been shared with the patient  Signed    Date of Service: 11/26/2023  8:56 PM  Signed  PICC line previous dressing was intact and dry,dressing changed and site assessed, no redness, no swelling on insertion site, flushed w/ good blood return.heparin  instilled for  possible discharge.PA notified.  Electronically signed by Arvella Bird, RN at 11/26/2023  9:00 PM   Spoke with pt states she is still having arm pain. Was told pain is from "epicondylitis ".  No concerns regarding picc line today.  Julien Odor, RMA

## 2023-11-28 NOTE — ED Provider Triage Note (Signed)
 Emergency Medicine Provider Triage Evaluation Note  Casey [name] , a 53 y.o. female  was evaluated in triage.  Pt complains of irritation from her PICC line site.  Reports the home health nurse change the dressing, and after this dressing change she began to get very itchy at the dressing site.  States she is allergic to Tegaderm.  Reports a couple purple  spots on her arm or any dressings site.  Denies rash anywhere else.  Denies any dizziness, vomiting, or difficulty breathing.  Patient request to have her PICC line completely removed.  States that she reached out to her infectious disease Dr. Levern Reader who told her this would be okay.  I have reached out to Dr. Levern Reader but have not received a response.  Review of Systems  Positive: As above Negative: As above  Physical Exam  BP (!) 152/87   Pulse 95   Temp 98.2 F (36.8 C) (Oral)   Resp 18   SpO2 98%  Gen:   Awake, no distress   Resp:  Normal effort  MSK:   Moves extremities without difficulty   No concern for anaphylaxis at this time  Medical Decision Making  Medically screening exam initiated at 9:58 PM.  Appropriate orders placed.  Casey [name] was informed that the remainder of the evaluation will be completed by another provider, this initial triage assessment does not replace that evaluation, and the importance of remaining in the ED until their evaluation is complete.     Casey Catena, PA-C 11/28/23 2158

## 2023-11-28 NOTE — ED Triage Notes (Signed)
 Pt had dressing changed from Home health nurse today and started to itch and noticed a few purple spots on arm. Pt states she wants PICC line removed not just a dressing change. Pt has an allergy  to Tegaderm and states that the home health nurse must have use d the wrong dressing.

## 2023-11-29 ENCOUNTER — Other Ambulatory Visit: Payer: Self-pay | Admitting: Pharmacy Technician

## 2023-11-29 ENCOUNTER — Telehealth: Payer: Self-pay

## 2023-11-29 ENCOUNTER — Telehealth: Payer: Self-pay | Admitting: Pharmacy Technician

## 2023-11-29 NOTE — Telephone Encounter (Signed)
 Per Dr. Levern Reader, okay to pull PICC line today. Relayed verbal orders to Mariah Shines, Development worker, international aid with [employer].  Reached out to [facility] team to see if we can get patient set up for IV rezafungin  400 mg x 1.   Labs from 11/21/23 faxed to Dr. Grover Ledger at [phone].  Update sent to Kay Parson, RN with [employer].   Akashdeep Chuba, BSN, RN

## 2023-11-29 NOTE — ED Notes (Signed)
 Pt stated she was leaving and calling her doctor in the morning.

## 2023-11-29 NOTE — Telephone Encounter (Signed)
 Rezafungin  is approved. Medication will arrive at [facility] tomorrow 4/23. Infusion center will reach out to patient to schedule.   Jaclynn Laumann, BSN, RN

## 2023-11-29 NOTE — Telephone Encounter (Signed)
 Spoke with Dr. Levern Reader. She has been in touch with [facility] RN, Autry Legions. Autry Legions is going to send her a picture of the PICC site, depending on site condition, Dr. Levern Reader will notify [facility] triage if PICC needs to be pulled.   Dr. Levern Reader would like for her to receive a one-time dose of rezafungin . If this is not feasible, then we can arrange one week of micafungin infusions at outpatient infusion center.   Will await update from Dr. Levern Reader.   All orders will need to be communicated to [facility] at [phone].  Kelven Flater, BSN, RN

## 2023-11-29 NOTE — Telephone Encounter (Signed)
 Her labs from 4/14 are under care everywhere thankfully. Otherwise, the only ones in [facility] link are from 11/09/23. WBC still barely elevated but has trended down nicely.

## 2023-11-29 NOTE — Telephone Encounter (Signed)
 Received call from patient, states she went to the ED last night to have PICC removed per Dr. Levern Reader.   Patient was in ED 5-6 hours, did not end up having PICC removed.   Reached out to provider, per Dr. Levern Reader: okay to pull PICC today, but she would like patient to have her infusion today before the line is removed.   Spoke with patient's [facility] RN John with [facility], relayed that okay to pull PICC after today's infusion. Orders repeated and verified.   Patient is also asking about labs that were drawn last week. States they're done via [facility] and faxed to the office. Attaching pharmacy team regarding labs.   John [phone]  Delroy Ordway, BSN, RN

## 2023-11-29 NOTE — Telephone Encounter (Signed)
 Auth Submission: NO AUTH NEEDED Site of care: Site of care: [facility] Payer: [employer] Medication & CPT/J Code(s) submitted:  REZAFUNGIN  [id number] Route of submission (phone, fax, portal): PORTAL Phone # Fax # Auth type: Buy/Bill PB Units/visits requested: 400MG  X1 DOSE Reference number: Ref: [id number]  Approval from: 11/29/23 to 12/29/23

## 2023-11-30 ENCOUNTER — Emergency Department (HOSPITAL_BASED_OUTPATIENT_CLINIC_OR_DEPARTMENT_OTHER)
Admission: EM | Admit: 2023-11-30 | Discharge: 2023-11-30 | Disposition: A | Discharge status: Home / Self Care | Attending: Emergency Medicine | Admitting: Emergency Medicine

## 2023-11-30 ENCOUNTER — Other Ambulatory Visit: Payer: Self-pay

## 2023-11-30 ENCOUNTER — Encounter (HOSPITAL_BASED_OUTPATIENT_CLINIC_OR_DEPARTMENT_OTHER): Payer: Self-pay

## 2023-11-30 DIAGNOSIS — I10 Essential (primary) hypertension: Secondary | ICD-10-CM | POA: Insufficient documentation

## 2023-11-30 DIAGNOSIS — R1110 Vomiting, unspecified: Secondary | ICD-10-CM | POA: Insufficient documentation

## 2023-11-30 DIAGNOSIS — R7989 Other specified abnormal findings of blood chemistry: Secondary | ICD-10-CM | POA: Diagnosis present

## 2023-11-30 DIAGNOSIS — Z79899 Other long term (current) drug therapy: Secondary | ICD-10-CM | POA: Diagnosis not present

## 2023-11-30 DIAGNOSIS — Z87442 Personal history of urinary calculi: Secondary | ICD-10-CM | POA: Insufficient documentation

## 2023-11-30 DIAGNOSIS — J45909 Unspecified asthma, uncomplicated: Secondary | ICD-10-CM | POA: Diagnosis not present

## 2023-11-30 DIAGNOSIS — E119 Type 2 diabetes mellitus without complications: Secondary | ICD-10-CM | POA: Insufficient documentation

## 2023-11-30 DIAGNOSIS — E876 Hypokalemia: Secondary | ICD-10-CM | POA: Insufficient documentation

## 2023-11-30 DIAGNOSIS — R197 Diarrhea, unspecified: Secondary | ICD-10-CM | POA: Diagnosis not present

## 2023-11-30 DIAGNOSIS — I2510 Atherosclerotic heart disease of native coronary artery without angina pectoris: Secondary | ICD-10-CM | POA: Insufficient documentation

## 2023-11-30 LAB — CBC
HCT: 33.9 % — ABNORMAL LOW (ref 36.0–46.0)
Hemoglobin: 10.8 g/dL — ABNORMAL LOW (ref 12.0–15.0)
MCH: 25.1 pg — ABNORMAL LOW (ref 26.0–34.0)
MCHC: 31.9 g/dL (ref 30.0–36.0)
MCV: 78.8 fL — ABNORMAL LOW (ref 80.0–100.0)
Platelets: 379 10*3/uL (ref 150–400)
RBC: 4.3 MIL/uL (ref 3.87–5.11)
RDW: 15.3 % (ref 11.5–15.5)
WBC: 12 10*3/uL — ABNORMAL HIGH (ref 4.0–10.5)
nRBC: 0 % (ref 0.0–0.2)

## 2023-11-30 LAB — COMPREHENSIVE METABOLIC PANEL WITH GFR
ALT: 17 U/L (ref 0–44)
AST: 22 U/L (ref 15–41)
Albumin: 4.2 g/dL (ref 3.5–5.0)
Alkaline Phosphatase: 118 U/L (ref 38–126)
Anion gap: 12 (ref 5–15)
BUN: 6 mg/dL (ref 6–20)
CO2: 27 mmol/L (ref 22–32)
Calcium: 9.9 mg/dL (ref 8.9–10.3)
Chloride: 101 mmol/L (ref 98–111)
Creatinine, Ser: 0.84 mg/dL (ref 0.44–1.00)
GFR, Estimated: >60 mL/min (ref >60)
Glucose, Bld: 123 mg/dL — ABNORMAL HIGH (ref 70–99)
Potassium: 2.7 mmol/L — CL (ref 3.5–5.1)
Sodium: 140 mmol/L (ref 135–145)
Total Bilirubin: 0.4 mg/dL (ref 0.0–1.2)
Total Protein: 8 g/dL (ref 6.5–8.1)

## 2023-11-30 LAB — MAGNESIUM: Magnesium: 2 mg/dL (ref 1.7–2.4)

## 2023-11-30 MED ORDER — POTASSIUM CHLORIDE 10 MEQ/100ML IV SOLN
10.0000 meq | INTRAVENOUS | Status: AC
  Administered 2023-11-30 (×3): 10 meq via INTRAVENOUS
  Filled 2023-11-30 (×3): qty 100

## 2023-11-30 NOTE — ED Provider Notes (Signed)
 Accepted handoff at shift change from [place], PA-C. Please see prior provider note for more detail.   Briefly: Patient is 53 y.o.   DDX: concern for hypokalemia, arrhythmia, dehydration  Plan: Wait for labs but likely requiring IV potassium repletion given inability to tolerate PO potassium.  Physical Exam  BP (!) 146/81   Pulse 92   Temp 98 F (36.7 C) (Oral)   Resp (!) 21   SpO2 100%   Physical Exam Vitals and nursing note reviewed.  Constitutional:      General: She is not in acute distress.    Appearance: She is well-developed.  HENT:     Head: Normocephalic and atraumatic.  Eyes:     Conjunctiva/sclera: Conjunctivae normal.  Cardiovascular:     Rate and Rhythm: Normal rate and regular rhythm.     Heart sounds: No murmur heard. Pulmonary:     Effort: Pulmonary effort is normal. No respiratory distress.     Breath sounds: Normal breath sounds.  Abdominal:     Palpations: Abdomen is soft.     Tenderness: There is no abdominal tenderness.  Musculoskeletal:        General: No swelling.     Cervical back: Neck supple.  Skin:    General: Skin is warm and dry.     Capillary Refill: Capillary refill takes less than 2 seconds.  Neurological:     Mental Status: She is alert.  Psychiatric:        Mood and Affect: Mood normal.     Procedures  Procedures  ED Course / MDM    Medical Decision Making Risk Prescription drug management.   Patient presented to the ED with concerns of abnormal labs. She states that she had labs drawn from her PICC line yesterday and was advised that her potassium level was at 2.3. She states that she has not had any vomiting recently and just recently finished IV antifungal for esophageal candidiasis. PICC line has been removed. States that she has ongoing issues with low potassium and has been unable to tolerate any PO version of potassium including tablets, powder, or liquid due to it causing significant nausea and vomiting.  Potassium  at 2.7, magnesium  unremarkable. IV potassium ordered for repletion. Discussed attempted PO potassium but patient does not want to attempt as she feels that she will vomit if she takes this medication. Total of 30mEq of IV potassium administered which patient tolerated. Discussed dietary modifications to increase potassium intake. Advised close follow up with PCP. Otherwise stable at this time for outpatient follow up and discharged home.       Exa Bomba A, PA-C 11/30/23 2203    Kingsley, Victoria K, DO 11/30/23 2343

## 2023-11-30 NOTE — Discharge Instructions (Addendum)
 You were seen in the ER today for concerns of abnormal labs. Your potassium was low today at 2.7 and you were given several rounds of IV potassium to help bring this level back up. As you are unable to take the oral versions of potassium, focus on increasing dietary sources of potassium such as potatoes, spinach, avocado, etc. Please follow up with your primary care provider for further evaluation for management of this. If any concerns for any new or worsening symptoms, return to the ER.

## 2023-11-30 NOTE — ED Provider Notes (Signed)
 [place] EMERGENCY DEPARTMENT AT [facility] Provider Note   CSN: [id number] Arrival date & time: 11/30/23  1543     History  Chief Complaint  Patient presents with   Abnormal Lab    Casey [name] is a 53 y.o. female with extensive past medical history as listed below presents with complaints of abnormal lab.  Patient obtained blood work yesterday from her rheumatologist.  Found to have a potassium of 2.3.  Patient reports that she has been feeling more tired and weak over the past month.  Does endorse some diarrhea and vomiting that is typical for her given her " motility issues".  Denies any abdominal pain, fevers or chills.  No new medications.  Patient just finished up antifungal infusions for esophageal candidiasis.  PICC line was removed yesterday.   Abnormal Lab     Past Medical History:  Diagnosis Date   Albuminuria 06/27/2015   Anxiety 11/04/2014   Arm DVT (deep venous thromboembolism), acute, left (HCC) 08/17/2022   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/30/2014   Asthma    Atopic dermatitis 10/30/2008   Formatting of this note might be different from the original. Atopic dermatitis Formatting of this note might be different from the original. Formatting of this note might be different from the original. Atopic dermatitis Formatting of this note might be different from the original. Formatting of this note might be different from the original. Formatting of this note might be different from the or   Cataract 05/06/2015   Contusion of left wrist 07/08/2021   Coronary artery disease s/p PCI/DES to LAD 07/01/2023 12/28/2022   Crohn's disease (HCC) 04/21/2015   De Quervain's tenosynovitis, left 07/31/2021   Diabetes mellitus without complication (HCC)    Diabetes type 2, controlled (HCC) 04/21/2015   Dry eyes 05/06/2015   DVT (deep venous thrombosis) (HCC)    Dysfunction of both eustachian tubes 08/31/2012   Encounter for monitoring  immunomodulating therapy 11/20/2021   Environmental allergies 08/12/2020   Esophageal candidiasis (HCC) 06/26/2022   Essential hypertension 06/27/2015   Fibrositis 08/28/2014   Formatting of this note might be different from the original. Fibromyalgia Formatting of this note might be different from the original. Formatting of this note might be different from the original. Fibromyalgia   Flank pain 11/24/2020   Gastroesophageal reflux disease with esophagitis without hemorrhage 08/01/2019   Formatting of this note might be different from the original. Formatting of this note might be different from the original. 07/2019: chronic symptoms of esophageal reflux since prior to sleeve gastrectomy surgery. Symptoms have worsened over the last 6 months with recent EGD findings of grade B esophagitis, irregular z line, erythematous mucosa, and evidence of sleeve gastrectomy. 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 Formatting of this note might be different from the original. 07/2019: 8 mm colonic polyp removed during colonoscopy, biopsy showed serrated polyp. Repeat colonoscopy recommended in one year. Formatting of this note might be different from the original. Added automatically from request for surgery [id number] Formatting of this note might b   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   History of kidney stones 08/12/2020   Hypercholesterolemia 10/07/2022   Hypertension, essential, benign 06/27/2015   Hypertensive disorder 08/28/2014  Formatting of this note might be different from the original. Hypertension Formatting of this note might be different from the original. Formatting of this note might be different from the original. 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 Keratoconus Formatting of this note might be different from the original. Formatting of this note might be different from the original. Keratoconus Formatting of this note might be different from the original. Formatting of this note might be different from the original. Keratoconus Formatting of this note might be different from the or   Microhematuria 06/27/2015   Migraine 08/28/2014   Formatting of this note might be different from the original. Migraine Formatting of this note might be different from the original. Formatting of this note might be different from the original. Migraine Formatting of this note might be different from the original. Migraine Formatting of this note might be different from the original. Migraine Formatting of this note might be different from the or   Migraine without aura and without status migrainosus, not intractable 08/28/2014   Formatting of this note might be different from the original. Formatting of this note might be different from the original. Formatting of this note might be different from the original. Migraine Formatting of this note might be different from the original. Migraine Formatting of this note might be different from the original. Formatting of this note might be different from the original. Migraine F   Mild intermittent asthma without complication 10/30/2008   Formatting of this note might be different from the original. Formatting of this note might be different from the original. Formatting of this note might be different from the original. Asthma Formatting of this note might be different from the original. Asthma Formatting of this note might be different from the original. Formatting of this note might be different from the original. 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 Formatting of this note might be different from the original. 01/25/2016 with Dr. Durrell Gilles at [facility]. [place] of this note might be different from the original. 01/25/2016 with Dr. Durrell Gilles at [facility]. [place] of this note might be different from the original. Formatting of this note might be different from    Seronegative inflammatory arthritis 08/28/2014   Formatting of this note might be different from the original. Formatting of this note might be different from the original. Arthritis; diagnosed as IBD related - 2012 Formatting of this note might be different from the original. Followed by rheumatologist. Had to change providers due to an insurance change earlier this year resulting in patient not being able to have Remicade  for ~6 months. [place]   Sero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 arthritis (HCC)    Slow transit constipation 08/01/2019  Formatting of this note might be different from the original. Formatting of this note might be different from the original. Treated with PRN miralax . Recent colonoscopy 07/2019. Formatting of this note might be different from the original. Treated with PRN miralax . Recent colonoscopy 07/2019. Formatting of this note might be different from the  original. Formatting of this note might be different f   Status post placement of ureteral stent 02/06/2021   Tooth infection 08/12/2020   Unstable angina (HCC) 07/29/2023   Ureteral stone 01/20/2022   Urinary tract infection, site not specified 06/26/2022    Past Surgical History:  Procedure Laterality Date   BACK SURGERY     L5-S1   CERVICAL CONE BIOPSY  04/2000   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   CORONARY STENT INTERVENTION N/A 07/01/2023   Procedure: CORONARY STENT INTERVENTION;  Surgeon: Wenona Hamilton, MD;  Location: [facility] INVASIVE CV LAB;  Service: Cardiovascular;  Laterality: N/A;   EYE SURGERY     GASTRIC BYPASS  2017   Sleve   LAPAROSCOPIC GASTRIC SLEEVE RESECTION     LEFT HEART CATH AND CORONARY ANGIOGRAPHY N/A 07/01/2023   Procedure: LEFT HEART CATH AND CORONARY ANGIOGRAPHY;  Surgeon: Wenona Hamilton, MD;  Location: [facility] INVASIVE CV LAB;  Service: Cardiovascular;  Laterality: N/A;   LEFT HEART CATH AND CORONARY ANGIOGRAPHY N/A 08/01/2023   Procedure: LEFT HEART CATH AND CORONARY ANGIOGRAPHY;  Surgeon: Knox Perl, MD;  Location: [facility] INVASIVE CV LAB;  Service: Cardiovascular;  Laterality: N/A;   Low back disc surgery     06/2000   TOTAL KNEE ARTHROPLASTY Right 11/2016     Home Medications Prior to Admission medications   Medication Sig Start Date End Date Taking? Authorizing Provider  acetaminophen  (TYLENOL ) 500 MG tablet Take 1,000 mg by mouth every 12 (twelve) hours as needed for mild pain or moderate pain.    [provider]  albuterol  (PROVENTIL ) (2.5 MG/3ML) 0.083% nebulizer solution Take 3 mLs (2.5 mg total) by nebulization every 6 (six) hours as needed for wheezing or shortness of breath. 10/13/23   Everlyn Hockey, PA-C  amLODipine  (NORVASC ) 10 MG tablet Take 1 tablet (10 mg total) by mouth daily. 08/08/23 11/09/23  Krasowski, Robert J, MD  atorvastatin  (LIPITOR) 80 MG tablet Take 1 tablet (80 mg total) by mouth daily. 09/19/23   Krasowski, Robert J, MD  B  Complex-C (B-COMPLEX WITH VITAMIN C) tablet Take 1 tablet by mouth daily. 09/06/19   [provider]  Cholecalciferol  (VITAMIN D3) 50 MCG (2000 UT) TABS Take 1 tablet by mouth daily.    [provider]  EPINEPHrine  (EPIPEN  2-PAK) 0.3 mg/0.3 mL IJ SOAJ injection Inject 0.3 mg into the muscle as needed for anaphylaxis. 01/18/23   Brian Campanile, MD  esomeprazole  (NEXIUM ) 40 MG capsule Take 1 capsule (40 mg total) by mouth 2 (two) times daily before a meal. Patient taking differently: Take 80 mg by mouth daily. 11/30/22   Adra Alanis, FNP  famotidine  (PEPCID ) 40 MG tablet Take 1 tablet (40 mg total) by mouth daily. 11/30/22   Adra Alanis, FNP  Golimumab [facility] ARIA IV) Inject 200 mg into the vein every 8 (eight) weeks.    [provider]  JARDIANCE 25 MG TABS tablet Take 25 mg by mouth daily. 11/03/23 11/02/24  [provider]  levocetirizine (XYZAL) 5 MG tablet Take 10 mg by mouth daily.    [provider]  lubiprostone (AMITIZA) 8 MCG capsule Take 8 mcg by mouth daily  with breakfast. 10/07/23 04/04/24  [provider]  mirabegron  ER (MYRBETRIQ ) 50 MG TB24 tablet Take 1 tablet (50 mg total) by mouth daily. 11/30/22   Adra Alanis, FNP  montelukast  (SINGULAIR ) 10 MG tablet Take 1 tablet (10 mg total) by mouth at bedtime. Patient taking differently: Take 10 mg by mouth daily. 11/30/22   Adra Alanis, FNP  nitroGLYCERIN  (NITROSTAT ) 0.4 MG SL tablet Place 1 tablet (0.4 mg total) under the tongue every 5 (five) minutes as needed for chest pain. 10/28/23   Tolia, Sunit, DO  oxyCODONE  (OXY IR/ROXICODONE ) 5 MG immediate release tablet Take 5 mg by mouth as needed for moderate pain (pain score 4-6) or severe pain (pain score 7-10). Very rarely    [provider]  ticagrelor  (BRILINTA ) 90 MG TABS tablet Take 1 tablet (90 mg total) by mouth 2 (two) times daily. 07/01/23   Abbe Abate, MD       Allergies    Albuterol  sulfate, Tape, Formoterol, Hydrocodone , Hydrocodone -acetaminophen , Nickel, Other, Oxycodone -acetaminophen , Salmeterol, Strawberry extract, Advair hfa [fluticasone-salmeterol], Proventil  hfa [albuterol ], Asa [aspirin ], Cleocin [clindamycin], Clindamycin/lincomycin, Fludrocortisone acetate, Gabapentin, Hydrochlorothiazide, Imipramine, Ipratropium bromide, Medroxyprogesterone, Meloxicam, Metformin and related, Nystatin, Penicillins, Pred forte [prednisolone], Prednisone, Solu-medrol  [methylprednisolone ], Sulfa antibiotics, Tegaderm ag mesh [silver], Toradol  [ketorolac  tromethamine ], and Tramadol    Review of Systems   Review of Systems  Neurological:  Positive for weakness.    Physical Exam Updated Vital Signs BP (!) 155/90 (BP Location: Left Arm)   Pulse 96   Temp 98.5 F (36.9 C)   Resp 16   SpO2 100%  Physical Exam Vitals and nursing note reviewed.  Constitutional:      General: She is not in acute distress.    Appearance: She is well-developed.  HENT:     Head: Normocephalic and atraumatic.  Eyes:     Conjunctiva/sclera: Conjunctivae normal.  Cardiovascular:     Rate and Rhythm: Normal rate and regular rhythm.     Heart sounds: No murmur heard. Pulmonary:     Effort: Pulmonary effort is normal. No respiratory distress.     Breath sounds: Normal breath sounds.  Abdominal:     Palpations: Abdomen is soft.     Tenderness: There is no abdominal tenderness.  Musculoskeletal:        General: No swelling.     Cervical back: Neck supple.  Skin:    General: Skin is warm and dry.     Capillary Refill: Capillary refill takes less than 2 seconds.  Neurological:     Mental Status: She is alert.  Psychiatric:        Mood and Affect: Mood normal.     ED Results / Procedures / Treatments   Labs (all labs ordered are listed, but only abnormal results are displayed) Labs Reviewed  CBC - Abnormal; Notable for the following components:      Result Value    WBC 12.0 (*)    Hemoglobin 10.8 (*)    HCT 33.9 (*)    MCV 78.8 (*)    MCH 25.1 (*)    All other components within normal limits  COMPREHENSIVE METABOLIC PANEL WITH GFR  MAGNESIUM     EKG EKG Interpretation Date/Time:  Wednesday November 30 2023 17:37:43 EDT Ventricular Rate:  84 PR Interval:  160 QRS Duration:  106 QT Interval:  370 QTC Calculation: 438 R Axis:   -48  Text Interpretation: Sinus rhythm Left axis deviation Low voltage, precordial leads No significant change since last tracing Confirmed  by Celesta Coke ([id number]) on 11/30/2023 6:02:39 PM  Radiology No results found.  Procedures Procedures    Medications Ordered in ED Medications - No data to display  ED Course/ Medical Decision Making/ A&P                                 Medical Decision Making  This patient presents to the ED with chief complaint(s) of abnormal lab.  The complaint involves an extensive differential diagnosis and also carries with it a high risk of complications and morbidity.   Pertinent past medical history as listed in HPI  The differential diagnosis includes  Iatrogenic, volume loss, poor intake Additional history obtained: Records reviewed Care Everywhere/External Records  Assessment and management:   Hemodynamically stable, nontoxic-appearing patient presenting with complaints of hypokalemia.  Blood work yesterday demonstrating potassium of 2.3.  Patient endorses weakness and fatigue over the past month.  She does chronically have some intermittent vomiting and diarrhea given her " motility issues".  No new symptoms.  No abdominal pain.  Abdomen is nontender.  She has underwent some antifungal infusions as of late for esophageal candidiasis.  Which is now resolved.  Otherwise no other new medications.  Reports that she does not tolerate whatsoever any p.o. potassium.  Will recheck levels and treat with IV.  Independent ECG interpretation:  Sinus rhythm low voltage, no QT prolongation    Independent labs interpretation:  The following labs were independently interpreted:  Pending  Independent visualization and interpretation of imaging: I independently visualized the following imaging with scope of interpretation limited to determining acute life threatening conditions related to emergency care: None   Consultations obtained:   None at this time  Disposition:   Signout given to Zelya, PA-C.  Please see his note for the remainder of the visit.  Disposition pending workup.  Social Determinants of Health:   None  This note was dictated with voice recognition software.  Despite best efforts at proofreading, errors may have occurred which can change the documentation meaning.          Final Clinical Impression(s) / ED Diagnoses Final diagnoses:  Hypokalemia    Rx / DC Orders ED Discharge Orders     None         Felicie Horning, PA-C 11/30/23 1821    Celesta Coke K, DO 11/30/23 1947

## 2023-11-30 NOTE — ED Triage Notes (Signed)
 In for eval of low potassium of 2.3 out of PICC line yesterday. Unable to tolerate PO potassium. PICC line was removed yesterday.

## 2023-11-30 NOTE — ED Notes (Signed)
 Meal tray given

## 2023-11-30 NOTE — ED Notes (Signed)
 Warm blanket given. Decreased room stimuli per request. Call bell in reach.

## 2023-12-01 ENCOUNTER — Telehealth (HOSPITAL_BASED_OUTPATIENT_CLINIC_OR_DEPARTMENT_OTHER): Payer: Self-pay

## 2023-12-01 NOTE — Telephone Encounter (Signed)
 patient states she was instructed to contact the office pertaining to a visit from 11/30/23. No notation found pertaining to communication.

## 2023-12-02 ENCOUNTER — Ambulatory Visit (INDEPENDENT_AMBULATORY_CARE_PROVIDER_SITE_OTHER)

## 2023-12-02 VITALS — BP 123/72 | HR 88 | Temp 98.2°F | Resp 16 | Ht 63.0 in | Wt 218.6 lb

## 2023-12-02 DIAGNOSIS — B3781 Candidal esophagitis: Secondary | ICD-10-CM

## 2023-12-02 MED ORDER — SODIUM CHLORIDE 0.9 % IV SOLN
400.0000 mg | Freq: Once | INTRAVENOUS | Status: AC
  Administered 2023-12-02: 400 mg via INTRAVENOUS
  Filled 2023-12-02: qty 20

## 2023-12-02 NOTE — Progress Notes (Addendum)
 Diagnosis: Esophageal candidiasis   Provider:  Phyllis Breeze MD  Procedure: IV Infusion  IV Type: Peripheral, IV Location: L Antecubital  Rezzayo  (rezafungin ), Dose: 400 mg  Infusion Start Time: 1354  Infusion Stop Time: 1500  Post Infusion IV Care: Peripheral IV Discontinued. Patient declined post observation period.  Discharge: Condition: Good, Destination: Home . AVS Declined  Performed by:  Lendel Quant, RN

## 2023-12-03 ENCOUNTER — Other Ambulatory Visit (HOSPITAL_BASED_OUTPATIENT_CLINIC_OR_DEPARTMENT_OTHER): Payer: Self-pay | Admitting: Student

## 2023-12-05 ENCOUNTER — Ambulatory Visit (INDEPENDENT_AMBULATORY_CARE_PROVIDER_SITE_OTHER): Admitting: *Deleted

## 2023-12-05 DIAGNOSIS — J309 Allergic rhinitis, unspecified: Secondary | ICD-10-CM

## 2023-12-06 ENCOUNTER — Encounter: Payer: Self-pay | Admitting: Internal Medicine

## 2023-12-06 ENCOUNTER — Other Ambulatory Visit: Payer: Self-pay

## 2023-12-06 ENCOUNTER — Ambulatory Visit (INDEPENDENT_AMBULATORY_CARE_PROVIDER_SITE_OTHER): Admitting: Internal Medicine

## 2023-12-06 VITALS — BP 127/88 | HR 92 | Temp 97.7°F | Wt 218.0 lb

## 2023-12-06 DIAGNOSIS — B3781 Candidal esophagitis: Secondary | ICD-10-CM | POA: Diagnosis not present

## 2023-12-06 NOTE — Patient Instructions (Signed)
 F/u with Dr. Levern Reader prn

## 2023-12-06 NOTE — Progress Notes (Signed)
 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   Coronary artery disease s/p PCI/DES to LAD 07/01/2023 12/28/2022   Precordial chest pain 10/13/2022   Hyperlipidemia 10/07/2022   Hiatal hernia 10/07/2022   Esophageal candidiasis (HCC) 06/26/2022   Urinary tract infection, site not specified 06/26/2022   Ureteral stone 01/20/2022   Encounter for monitoring immunomodulating therapy 11/20/2021   Rheumatoid arthritis of multiple sites with 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 factor (HCC) 11/16/2021   Pyelonephritis 09/25/2021   De Quervain's tenosynovitis, left 07/31/2021   Contusion of left wrist 07/08/2021   Status post placement of ureteral stent 02/06/2021   Proteinuria 08/12/2020   Environmental allergies 08/12/2020   History of kidney stones 08/12/2020   Tooth infection 08/12/2020   Gastroesophageal reflux disease with esophagitis without hemorrhage 08/01/2019   Slow transit constipation 08/01/2019   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   History of colon polyps 06/19/2019   Irregular astigmatism of both eyes 10/11/2018   Nephrolithiasis 09/08/2018   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   Plantar wart 11/04/2016   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   Myopia with astigmatism and presbyopia 05/07/2016   Overactive bladder 11/18/2015   Incontinence 10/07/2015   Albuminuria 06/27/2015   Essential hypertension 06/27/2015   Microhematuria 06/27/2015   Hematuria 06/18/2015   Cataract 05/06/2015    Dry eyes 05/06/2015   Crohn's disease (HCC) 04/21/2015   Osteoarthritis of both knees 04/21/2015   Right knee pain 04/21/2015   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/30/2014   History of abnormal cervical Pap smear 11/29/2014   Fibrositis 08/28/2014   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   ALBUTEROL  (PROVENTIL ) (2.5 MG/3ML) 0.083% NEBULIZER SOLUTION    Take 3 mLs (2.5 mg total) by nebulization every 6 (six) hours as needed for wheezing or shortness of breath.   AMLODIPINE  (NORVASC ) 10 MG TABLET    Take 1 tablet (10 mg total) by mouth daily.   ATORVASTATIN  (LIPITOR) 80 MG TABLET    Take 1 tablet (80 mg total) by mouth daily.   B COMPLEX-C (B-COMPLEX WITH VITAMIN C) TABLET    Take 1 tablet by mouth daily.   CHOLECALCIFEROL  (VITAMIN D3) 50 MCG (2000 UT) TABS    Take 1 tablet by mouth daily.   EPINEPHRINE  (EPIPEN  2-PAK) 0.3 MG/0.3 ML IJ SOAJ INJECTION    Inject 0.3 mg into the muscle as needed for anaphylaxis.   ESOMEPRAZOLE  (NEXIUM ) 40 MG CAPSULE    Take 1 capsule (40 mg total) by mouth 2 (two) times daily  before a meal.   FAMOTIDINE  (PEPCID ) 40 MG TABLET    Take 1 tablet (40 mg total) by mouth daily.   GOLIMUMAB (SIMPONI ARIA IV)    Inject 200 mg into the vein every 8 (eight) weeks.   JARDIANCE 25 MG TABS TABLET    Take 25 mg by mouth daily.   LEVOCETIRIZINE (XYZAL) 5 MG TABLET    Take 10 mg by mouth daily.   LUBIPROSTONE (AMITIZA) 8 MCG CAPSULE     Take 8 mcg by mouth daily with breakfast.   MIRABEGRON  ER (MYRBETRIQ ) 50 MG TB24 TABLET    Take 1 tablet (50 mg total) by mouth daily.   MONTELUKAST  (SINGULAIR ) 10 MG TABLET    Take 1 tablet (10 mg total) by mouth at bedtime.   NITROGLYCERIN  (NITROSTAT ) 0.4 MG SL TABLET    Place 1 tablet (0.4 mg total) under the tongue every 5 (five) minutes as needed for chest pain.   OXYCODONE  (OXY IR/ROXICODONE ) 5 MG IMMEDIATE RELEASE TABLET    Take 5 mg by mouth as needed for moderate pain (pain score 4-6) or severe pain (pain score 7-10). Very rarely   TICAGRELOR  (BRILINTA ) 90 MG TABS TABLET    Take 1 tablet (90 mg total) by mouth 2 (two) times daily.  Modified Medications   No medications on file  Discontinued Medications   No medications on file    Subjective: 53 year old female with past medical history as below presents for follow-up of candidal esophagitis.  She has been placed on micafungin x 14 days.  Completed antibiotics.  Notes that her oral symptoms have resolved.  PICC line out  please see HPI from 11/15/23 for further details: "No cough but feels like having increasing phlegm/only slight difficulty swallowing -- also noticing she is noticing more difficulty eating solid food. Started to vomit, after she eating solid food.  New onset of thrush.  Had remicaide infusion on 4/3 Has had increasing fatigue since infusion. Went to ED -   Also noticed that her WBC is elevated"   Review of Systems: Review of Systems  All other systems reviewed and are negative.   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 Atopic dermatitis Formatting of this note might be different from the original. Formatting of this note  might be different from the original. Atopic dermatitis Formatting of this note might be different from the original. Formatting of this note might be different from the original. Formatting of this note might be different from the or   Cataract 05/06/2015   Contusion of left wrist 07/08/2021   Coronary artery disease s/p PCI/DES to LAD 07/01/2023 12/28/2022   Crohn's disease (HCC) 04/21/2015   De Quervain's tenosynovitis, left 07/31/2021   Diabetes mellitus without complication (HCC)    Diabetes type 2, controlled (HCC) 04/21/2015   Dry eyes 05/06/2015   DVT (deep venous thrombosis) (HCC)    Dysfunction of both eustachian tubes 08/31/2012   Encounter for monitoring immunomodulating therapy 11/20/2021   Environmental allergies 08/12/2020   Esophageal candidiasis (HCC) 06/26/2022   Essential hypertension 06/27/2015   Fibrositis 08/28/2014   Formatting of this note might be different from the original. Fibromyalgia Formatting of this note might be different from the original.  Formatting of this note might be different from the original. Fibromyalgia   Flank pain 11/24/2020   Gastroesophageal reflux disease with esophagitis without hemorrhage 08/01/2019   Formatting of this note might be different from the original. Formatting of this note might be different from the original. 07/2019: chronic symptoms of esophageal reflux since prior to sleeve gastrectomy surgery. Symptoms have worsened over the last 6 months with recent EGD findings of grade B esophagitis, irregular z line, erythematous mucosa, and evidence of sleeve gastrectomy. 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 Formatting of this note might be different from the original. 07/2019: 8 mm  colonic polyp removed during colonoscopy, biopsy showed serrated polyp. Repeat colonoscopy recommended in one year. Formatting of this note might be different from the original. Added automatically from request for surgery [phone] Formatting of this note might b   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   History of kidney stones 08/12/2020   Hypercholesterolemia 10/07/2022   Hypertension, essential, benign 06/27/2015   Hypertensive disorder 08/28/2014   Formatting of this note might be different from the original. Hypertension Formatting of this note might be different from the original. Formatting of this note might be different from the original. 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 Keratoconus Formatting of this note might be different from the original. Formatting of this note might be different from the original. Keratoconus Formatting of this note might be different from the original. Formatting of this note might be different from the original. Keratoconus Formatting of this note might be different from the or   Microhematuria 06/27/2015   Migraine 08/28/2014   Formatting of this note might be different from the original. Migraine Formatting of this note might be different from the original. Formatting of this note might be different from the original. Migraine Formatting of this note might be different from the original. Migraine Formatting of this note might be different from the original. Migraine Formatting of this note might be different from the or   Migraine without aura and without status migrainosus, not intractable 08/28/2014   Formatting of this note might be different from the original. Formatting of this note might be different from the original. Formatting of this note might be different from the  original. Migraine Formatting of this note might be different from the original. Migraine Formatting of this note might be different from the original. Formatting of this note might be different from the original. Migraine F   Mild intermittent asthma without complication 10/30/2008   Formatting of this note might be different from the original. Formatting of this note might be different from the original. Formatting of this note might be different from the original. Asthma Formatting of this note might be different from the original. Asthma Formatting of this note might be different from the original. Formatting of this note might be different from the original. 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 Formatting of this note might be different from the original. 01/25/2016 with Dr. Durrell Gilles at [facility] [place] of this note might be different from the original. 01/25/2016 with Dr. Durrell Gilles at [facility] [place] of this note might be different from the original. Formatting of this note might be different from    Seronegative inflammatory arthritis 08/28/2014   Formatting of this note might be different from the original. Formatting of this note might be different from the original. Arthritis; diagnosed as IBD related  - 2012 Formatting of this note might be different from the original. Followed by rheumatologist. Had to change providers due to an insurance change earlier this year resulting in patient not being able to have Remicade  for ~6 months. 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 Formatting of this note might be different from the original. Treated with PRN miralax . Recent colonoscopy 07/2019. Formatting of this note might be different from the original. Treated with PRN miralax . Recent colonoscopy 07/2019. Formatting of this note might be different from the original. Formatting of this note might be different f   Status post placement of ureteral stent 02/06/2021   Tooth infection 08/12/2020   Unstable angina (HCC) 07/29/2023   Ureteral stone 01/20/2022   Urinary tract infection, site not specified 06/26/2022    Social History   Tobacco Use   Smoking status: Never    Passive exposure: Never   Smokeless tobacco: Never  Vaping Use   Vaping status: Never Used  Substance Use Topics   Alcohol use: Yes    Comment: rare   Drug use: Never    Family History  Adopted: Yes  Problem Relation Age of Onset   Diabetes Mother    Hypertension Mother    Diabetes Father    Hypertension Father    Hypertension Sister    Diabetes Sister    Hypertension Brother    Diabetes Brother    Diabetes Paternal Grandmother    Cancer Other    Asthma Neg Hx    Allergic rhinitis Neg Hx    Atopy Neg Hx    Eczema Neg Hx     Allergies  Allergen Reactions   Albuterol  Sulfate Shortness Of Breath    Other reaction(s): Other (See Comments)  can't breathe  Other reaction(s): Other (See Comments) can't breathe   Tape     Other reaction(s): Other (See Comments), Other (See Comments), Other (See Comments)  severe skin breakdown  Cast and Bandage Cover  severe skin breakdown  severe skin breakdown    Formoterol Other (See Comments)    Other reaction(s): Headaches  Other reaction(s): Headaches  Other reaction(s): Headaches  Headaches  Other reaction(s): Headaches Other reaction(s): Headaches    Other reaction(s): Headaches    Headaches   Hydrocodone  Rash   Hydrocodone -Acetaminophen  Rash   Nickel Rash and Other (See Comments)   Other Rash    cast and bandage use  cast and bandage  use  cast and bandage use  cast and bandage use    cast and bandage use cast and bandage use   Oxycodone -Acetaminophen  Other (See Comments)    headache   Salmeterol Rash   Strawberry Extract Hives    Full body rash from strawberry   Advair Hfa [Fluticasone-Salmeterol]     unknown   Proventil  Hfa [Albuterol ]     unknown   Asa [Aspirin ] Rash   Cleocin [Clindamycin] Rash   Clindamycin/Lincomycin Rash   Fludrocortisone Acetate Rash   Gabapentin Rash   Hydrochlorothiazide Rash   Imipramine Rash   Ipratropium Bromide Rash   Medroxyprogesterone Rash   Meloxicam Rash   Metformin And Related Rash   Nystatin Rash   Penicillins Rash   Pred Forte [Prednisolone] Rash   Prednisone Rash   Solu-Medrol  [Methylprednisolone ] Rash   Sulfa Antibiotics Rash   Tegaderm Ag Mesh [Silver] Rash   Toradol  [Ketorolac  Tromethamine ] Rash   Tramadol Rash    Health Maintenance  Topic Date Due   COVID-19 Vaccine (3 - Mixed Product risk series) 07/05/2022   Colonoscopy  01/02/2024 (Originally 05/13/2014)   Diabetic kidney evaluation - Urine ACR  01/09/2024 (Originally 05/14/1987)   Pneumococcal Vaccine 23-18 Years old (1 of 2 - PCV) 01/09/2024 (Originally 05/13/1988)   FOOT EXAM  01/09/2024 (Originally 05/14/1979)   Hepatitis C Screening  01/09/2024 (Originally 05/14/1987)   Zoster Vaccines- Shingrix (1 of 2) 01/09/2024 (Originally 05/13/1988)   OPHTHALMOLOGY EXAM  12/27/2023   HEMOGLOBIN A1C  03/05/2024   INFLUENZA VACCINE  03/09/2024   Diabetic kidney evaluation - eGFR measurement  11/29/2024   MAMMOGRAM   12/19/2024   Cervical Cancer Screening (HPV/Pap Cotest)  07/17/2028   DTaP/Tdap/Td (2 - Td or Tdap) 09/24/2033   HIV Screening  Completed   HPV VACCINES  Aged Out   Meningococcal B Vaccine  Aged Out    Objective:  Vitals:   12/06/23 1424  BP: 127/88  Pulse: 92  Temp: 97.7 F (36.5 C)  TempSrc: Oral  SpO2: 99%  Weight: 218 lb (98.9 kg)   Body mass index is 38.62 kg/m.  Physical Exam Constitutional:      Appearance: Normal appearance.  HENT:     Head: Normocephalic and atraumatic.     Right Ear: Tympanic membrane normal.     Left Ear: Tympanic membrane normal.     Nose: Nose normal.     Mouth/Throat:     Mouth: Mucous membranes are moist.  Eyes:     Extraocular Movements: Extraocular movements intact.     Conjunctiva/sclera: Conjunctivae normal.     Pupils: Pupils are equal, round, and reactive to light.  Cardiovascular:     Rate and Rhythm: Normal rate and regular rhythm.     Heart sounds: No murmur heard.    No friction rub. No gallop.  Pulmonary:     Effort: Pulmonary effort is normal.     Breath sounds: Normal breath sounds.  Abdominal:     General: Abdomen is flat.     Palpations: Abdomen is soft.  Musculoskeletal:        General: Normal range of motion.  Skin:    General: Skin is warm and dry.  Neurological:     General: No focal deficit present.     Mental Status: She is alert and oriented to person, place, and time.  Psychiatric:        Mood and Affect: Mood normal.    Physical Exam   Lab Results Lab Results  Component Value Date   WBC 12.0 (H) 11/30/2023   HGB 10.8 (L) 11/30/2023   HCT 33.9 (L) 11/30/2023   MCV 78.8 (L) 11/30/2023   PLT 379 11/30/2023    Lab Results  Component Value Date   CREATININE 0.84 11/30/2023   BUN 6 11/30/2023   NA 140 11/30/2023   K 2.7 (LL) 11/30/2023   CL 101 11/30/2023   CO2 27 11/30/2023    Lab Results  Component Value Date   ALT 17 11/30/2023   AST 22 11/30/2023   ALKPHOS 118 11/30/2023   BILITOT  0.4 11/30/2023    Lab Results  Component Value Date   CHOL 249 (H) 09/06/2023   HDL 73 09/06/2023   LDLCALC 140 (H) 09/06/2023   TRIG 205 (H) 09/06/2023   CHOLHDL 3.4 09/06/2023   No results found for: "LABRPR", "RPRTITER" No results found for: "HIV1RNAQUANT", "HIV1RNAVL", "CD4TABS"   Problem List Items Addressed This Visit   None  Results   Assessment/Plan #Candida esophagitis - Completed micafungin x 2 weeks.  Last dose was on Friday.  Of PICC lines out. - Oral/esophageal candidiasis symptoms have resolved. -Follow-up with Dr. Artemio Larry as needed    #CKD followed by nephrology #Hypokalemia Followed by Rheumatology for sero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 arthritis/ She notes at 11/30/23 visit her potassium was low at 23.  Potassium was replenished in the ED(2.7 in the ED prior to replenished).  She plans to get it rechecked this week with nephrology on May 1st.  Denies any shortness of breath or palpitations. - Also notes that golimumab held in the setting of candidiasis  Orlie Bjornstad, MD [facility] 12/06/2023, 2:28 PM  I have personally spent 45 minutes involved in face-to-face and non-face-to-face activities for this patient on the day of the visit. Professional time spent includes the following activities: Preparing to see the patient (review of tests), Obtaining and/or reviewing separately obtained history (admission/discharge record), Performing a medically appropriate examination and/or evaluation , Ordering medications/tests/procedures, referring and communicating with other health care professionals, Documenting clinical information in the EMR, Independently interpreting results (not separately reported), Communicating results to the patient/family/caregiver, Counseling and educating the patient/family/caregiver and Care coordination (not separately reported).

## 2023-12-11 ENCOUNTER — Other Ambulatory Visit: Payer: Self-pay | Admitting: Cardiology

## 2023-12-11 ENCOUNTER — Other Ambulatory Visit (HOSPITAL_BASED_OUTPATIENT_CLINIC_OR_DEPARTMENT_OTHER): Payer: Self-pay | Admitting: Student

## 2023-12-12 ENCOUNTER — Ambulatory Visit (INDEPENDENT_AMBULATORY_CARE_PROVIDER_SITE_OTHER)

## 2023-12-12 DIAGNOSIS — J309 Allergic rhinitis, unspecified: Secondary | ICD-10-CM

## 2023-12-13 MED ORDER — NITROGLYCERIN 0.4 MG SL SUBL
0.4000 mg | SUBLINGUAL_TABLET | SUBLINGUAL | 3 refills | Status: AC | PRN
Start: 2023-12-13 — End: ?

## 2023-12-14 ENCOUNTER — Ambulatory Visit (INDEPENDENT_AMBULATORY_CARE_PROVIDER_SITE_OTHER)

## 2023-12-14 ENCOUNTER — Encounter (HOSPITAL_BASED_OUTPATIENT_CLINIC_OR_DEPARTMENT_OTHER): Payer: Self-pay | Admitting: Student

## 2023-12-14 ENCOUNTER — Ambulatory Visit (HOSPITAL_BASED_OUTPATIENT_CLINIC_OR_DEPARTMENT_OTHER): Admitting: Student

## 2023-12-14 VITALS — BP 118/79 | HR 78 | Temp 98.2°F | Resp 16 | Ht 63.0 in | Wt 220.5 lb

## 2023-12-14 DIAGNOSIS — J309 Allergic rhinitis, unspecified: Secondary | ICD-10-CM | POA: Diagnosis not present

## 2023-12-14 DIAGNOSIS — G8929 Other chronic pain: Secondary | ICD-10-CM | POA: Insufficient documentation

## 2023-12-14 MED ORDER — OXYCODONE HCL 5 MG PO TABS: 5.0000 mg | ORAL_TABLET | Freq: Four times a day (QID) | ORAL | 0 refills | Status: AC | PRN

## 2023-12-14 NOTE — Patient Instructions (Signed)
 It was nice to see you today!  If you have any problems before your next visit feel free to message me via MyChart (minor issues or questions) or call the office, otherwise you may reach out to schedule an office visit.  Thank you! Pau Banh, PA-C

## 2023-12-14 NOTE — Progress Notes (Signed)
 Acute Office Visit  Subjective:     Patient ID: Casey [name], female    DOB: 28-Jan-1971, 53 y.o.   MRN: [id number]  Chief Complaint  Patient presents with   Medical Management of Chronic Issues    Per pt: Rheumatologist took me off my dmar due to [facility] repeating itself more rest times he does not have an openings until August and my pain levels are through the roof so that I cannot sleep at night he stated to contact you because of the opioid laws in the state as I'm hopeful that 5 mg of oxycodone  PR and will help me to sleep when my pain levels (7-8) are through the roof which they are at this point as I'm lucky if I can sleep for 2 to 3 hours and that is taking Tylenol  PM to the max amount.     HPI  Discussed the use of AI scribe software for clinical note transcription with the patient, who gave verbal consent to proceed.  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  She experiences chronic joint pain affecting, predominantly, her fingers, wrists, and ankles. Pain is rated as 6 out of 10 on average, with episodes reaching 7 to 8 out of 10. The pain is daily and has increased from her previous norm of 3 to 4 out of 10, leading to reduced activity levels. She has a history of rheumatoid arthritis and was previously treated with gomilimumab, which was discontinued due to recurrent thrush. An ultrasound of her wrists and ankles is scheduled for June, with a follow-up appointment in August.  She experiences significant sleep disturbances, waking every two to three hours and rarely achieving more than three hours of sleep consistently. She attributes this to her pain levels and uses Tylenol  PM and oxycodone  5 mg sparingly to manage symptoms, primarily to aid sleep when pain is severe.  She has attempted non-pharmacological interventions such as swimming at  the rec center. She prefers opioids over steroids due to past experiences with medications like solumedrol. She is agreeable with attending a pain management practice. She is agreeable to a short supply of oxycodone  until she can get in to pain management.  No recent changes in her nails, although they are breaking more easily. She has already gone through menopause, which she believes was completed during the pandemic.   ROS Per HPI     Objective:    BP 118/79   Pulse 78   Temp 98.2 F (36.8 C) (Oral)   Resp 16   Ht 5\' 3"  (1.6 m)   Wt 220 lb 8 oz (100 kg)   SpO2 98%   BMI 39.06 kg/m    Physical Exam Constitutional:      General: She is not in acute distress.    Appearance: Normal appearance. She is not ill-appearing.  HENT:     Head: Normocephalic and atraumatic.     Nose: Nose normal.  Eyes:     General: No scleral icterus.    Conjunctiva/sclera: Conjunctivae normal.  Cardiovascular:     Rate and Rhythm: Normal rate and regular rhythm.     Heart sounds: Normal heart sounds. No murmur heard.    No friction rub.  Pulmonary:     Effort: Pulmonary effort is normal. No respiratory distress.     Breath sounds: Normal breath sounds. No  wheezing, rhonchi or rales.  Musculoskeletal:        General: Normal range of motion.     Comments: No effusions or erythema to palpation of fingers, ankles, or wrists. Reported pain to palpation of joints.  Skin:    General: Skin is warm and dry.     Coloration: Skin is not jaundiced or pale.  Neurological:     Mental Status: She is alert.  Psychiatric:        Mood and Affect: Mood normal.        Behavior: Behavior normal.     No results found for any visits on 12/14/23.      Assessment & Plan:   Assessment and Plan    Chronic pain in multiple joints Chronic pain in multiple joints, including fingers, wrists, and ankles, with a baseline pain level of 6/10, increasing to 7-8/10. Pain is daily and affects sleep. Previous  treatments include opioids and Kenalog  injections. She is hesitant to use opioids but prefers them over steroids. A rheumatologist has scheduled an ultrasound of wrists and ankles in June to assess for fluid or inflammation. Referral to pain management is considered due to regulatory restrictions on opioid prescriptions. She plans to contact Dr. Claudean Crumbly at [facility] for pain management consultation. - Prescribe oxycodone  5 mg, 20 tablets, to be taken every six hours as needed for pain. - Advise her to contact Dr. Claudean Crumbly at [facility] for pain management consultation. - Instruct her to inform if pain management appointment is too far out to explore other referral options.  Sleep disturbance due to pain Sleep disturbance secondary to chronic joint pain, with reports of waking every 2-3 hours and achieving only 3 hours of consistent sleep. Pain management is prioritized to improve sleep quality. - Prescribe oxycodone  5 mg, 20 tablets, to be taken every six hours as needed, primarily to aid sleep at night.      Return if symptoms worsen or fail to improve.  Daeron Carreno T Torien Ramroop, PA-C

## 2023-12-19 ENCOUNTER — Ambulatory Visit (INDEPENDENT_AMBULATORY_CARE_PROVIDER_SITE_OTHER): Admitting: *Deleted

## 2023-12-19 DIAGNOSIS — J309 Allergic rhinitis, unspecified: Secondary | ICD-10-CM | POA: Diagnosis not present

## 2023-12-21 ENCOUNTER — Ambulatory Visit (INDEPENDENT_AMBULATORY_CARE_PROVIDER_SITE_OTHER)

## 2023-12-21 DIAGNOSIS — J309 Allergic rhinitis, unspecified: Secondary | ICD-10-CM

## 2023-12-26 ENCOUNTER — Ambulatory Visit (INDEPENDENT_AMBULATORY_CARE_PROVIDER_SITE_OTHER): Payer: Self-pay | Admitting: *Deleted

## 2023-12-26 ENCOUNTER — Ambulatory Visit: Attending: Cardiovascular Disease | Admitting: Pharmacist

## 2023-12-26 ENCOUNTER — Other Ambulatory Visit (HOSPITAL_COMMUNITY): Payer: Self-pay

## 2023-12-26 ENCOUNTER — Telehealth: Payer: Self-pay | Admitting: Pharmacy Technician

## 2023-12-26 DIAGNOSIS — J309 Allergic rhinitis, unspecified: Secondary | ICD-10-CM | POA: Diagnosis not present

## 2023-12-26 DIAGNOSIS — E785 Hyperlipidemia, unspecified: Secondary | ICD-10-CM | POA: Diagnosis not present

## 2023-12-26 DIAGNOSIS — I25118 Atherosclerotic heart disease of native coronary artery with other forms of angina pectoris: Secondary | ICD-10-CM

## 2023-12-26 NOTE — Progress Notes (Signed)
 Patient ID: Casey [name]                 DOB: February 13, 1971                    MRN: [id number]      HPI: Casey [name] is a 52 y.o. female patient referred to lipid clinic by Dr. Albert Huff. 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   Patient presents today to lipid clinic.  Latest labs in January showed an LDL-C of 140.  Triglycerides elevated but confirmed with patient labs were not fasting.  Has been on atorvastatin  80 mg daily since her stent in November 2024.   She had gastric sleeve in 2017.  Lost 140 pounds.  Gained 40 back but has maintained 100 pound loss since then.  He show starting to get back into exercising in the pool.  Is also supposed to be starting cardiac rehab in [facility]  Appears there are some issue with the stickers they use for cardiac monitoring which she is allergic to.  Reviewed PCSK9 inhibitor.  Discussed mechanisms of action, dosing, side effects and potential decreases in LDL cholesterol.  Also reviewed cost information and potential options for patient assistance.   Current Medications: atorvastatin  80mg  daily Risk Factors: premature CAD, DM, RA LDL-C goal: <55 ApoB goal: <70  Diet: small meals due to gastric sleeve Very limited cheese Fried foods 1-2 times a week Husband cooks 50% of time- eat out the others Drink: water, tea w/ honey, soda every once in awhile  Exercise: just starting to get back into pool, trying to do cardiac rehab  Family History:  Family History  Adopted: Yes  Problem Relation Age of Onset   Diabetes Mother    Hypertension Mother    Diabetes Father    Hypertension Father    Hypertension Sister    Diabetes Sister    Hypertension Brother    Diabetes Brother     Diabetes Paternal Grandmother    Cancer Other    Asthma Neg Hx    Allergic rhinitis Neg Hx    Atopy Neg Hx    Eczema Neg Hx      Social History: no tobacco, very rare ETOH  Labs: Lipid Panel     Component Value Date/Time   CHOL 249 (H) 09/06/2023 1120   TRIG 205 (H) 09/06/2023 1120   HDL 73 09/06/2023 1120   CHOLHDL 3.4 09/06/2023 1120   LDLCALC 140 (H) 09/06/2023 1120   LABVLDL 36 09/06/2023 1120    Past Medical History:  Diagnosis Date   Albuminuria 06/27/2015   Anxiety 11/04/2014   Arm DVT (deep venous thromboembolism), acute, left (HCC) 08/17/2022   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/30/2014   Asthma    Atopic dermatitis 10/30/2008   Formatting of this note might be different from the original. Atopic dermatitis Formatting of this note might be different from the original. Formatting of this note might be different from the original. Atopic dermatitis Formatting of this note might be different from the original. Formatting of this note might be different from the original. Formatting of this note might be different from the or   Cataract 05/06/2015   Contusion of left wrist 07/08/2021  Coronary artery disease s/p PCI/DES to LAD 07/01/2023 12/28/2022   Crohn's disease (HCC) 04/21/2015   De Quervain's tenosynovitis, left 07/31/2021   Diabetes mellitus without complication (HCC)    Diabetes type 2, controlled (HCC) 04/21/2015   Dry eyes 05/06/2015   DVT (deep venous thrombosis) (HCC)    Dysfunction of both eustachian tubes 08/31/2012   Encounter for monitoring immunomodulating therapy 11/20/2021   Environmental allergies 08/12/2020   Esophageal candidiasis (HCC) 06/26/2022   Essential hypertension 06/27/2015   Fibrositis 08/28/2014   Formatting of this note might be different from the original. Fibromyalgia Formatting of this note might be different from the original. Formatting of this note might be different from the original. Fibromyalgia    Flank pain 11/24/2020   Gastroesophageal reflux disease with esophagitis without hemorrhage 08/01/2019   Formatting of this note might be different from the original. Formatting of this note might be different from the original. 07/2019: chronic symptoms of esophageal reflux since prior to sleeve gastrectomy surgery. Symptoms have worsened over the last 6 months with recent EGD findings of grade B esophagitis, irregular z line, erythematous mucosa, and evidence of sleeve gastrectomy. 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 Formatting of this note might be different from the original. 07/2019: 8 mm colonic polyp removed during colonoscopy, biopsy showed serrated polyp. Repeat colonoscopy recommended in one year. Formatting of this note might be different from the original. 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 Hypertension Formatting of this note might be different from the original. Formatting of this note might be different from the original. 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 Keratoconus Formatting of this note might be different from the original. Formatting of this note might be different from the original. Keratoconus Formatting of this note might be different from the  original. Formatting of this note might be different from the original. Keratoconus Formatting of this note might be different from the or   Microhematuria 06/27/2015   Migraine 08/28/2014   Formatting of this note might be different from the original. Migraine Formatting of this note might be different from the original. Formatting of this note might be different from the original. Migraine Formatting of this note might be different from the original. Migraine Formatting of this note might be different from the original. Migraine Formatting of this note might be different from the or   Migraine without aura and without status migrainosus, not intractable 08/28/2014   Formatting of this note might be different from the original. Formatting of this note might be different from the original. Formatting of this note might be different from the original. Migraine Formatting of this note might be different from the original. Migraine Formatting of this note might be different from the original. Formatting of this note might be different from the  original. Migraine F   Mild intermittent asthma without complication 10/30/2008   Formatting of this note might be different from the original. Formatting of this note might be different from the original. Formatting of this note might be different from the original. Asthma Formatting of this note might be different from the original. Asthma Formatting of this note might be different from the original. Formatting of this note might be different from the original. 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 Formatting of this note might be different from the original. 01/25/2016 with Dr. Durrell Gilles at [facility]. [place] of this note might be different from the original. 01/25/2016 with Dr. Durrell Gilles at [facility]. [place] of this note might be different from the original. Formatting of this note might be different from    Seronegative inflammatory arthritis 08/28/2014   Formatting of this note might be different from the original. Formatting of this note might be different from the original. Arthritis; diagnosed as IBD related - 2012 Formatting of this note might be different from the original. Followed by rheumatologist. Had to change providers due to an insurance change earlier this year resulting in patient not being able to have Remicade  for ~6 months. 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 Formatting of this note might be different from the original. Treated with PRN miralax . Recent colonoscopy 07/2019. Formatting of this note might be different from the original. Treated with PRN miralax . Recent colonoscopy 07/2019. Formatting of this note might be different from the original. Formatting of this note might be different f   Status post placement of ureteral stent 02/06/2021   Tooth infection 08/12/2020   Unstable angina (HCC) 07/29/2023   Ureteral stone 01/20/2022   Urinary tract infection, site not specified 06/26/2022    Current Outpatient Medications on File Prior to Visit  Medication Sig Dispense Refill   albuterol  (PROVENTIL ) (2.5 MG/3ML) 0.083% nebulizer  solution Take 3 mLs (2.5 mg total) by nebulization every 6 (six) hours as needed for wheezing or shortness of breath. 150 mL 1   atorvastatin  (LIPITOR) 80 MG tablet Take 1 tablet (80 mg total) by mouth daily. 30 tablet 2   B Complex-C (B-COMPLEX WITH VITAMIN C) tablet Take 1 tablet by mouth daily.     Cholecalciferol  (VITAMIN D3) 50 MCG (2000 UT) TABS Take 1 tablet by mouth daily.     EPINEPHrine  (EPIPEN  2-PAK) 0.3 mg/0.3 mL IJ SOAJ injection Inject 0.3 mg into the muscle as needed for anaphylaxis. 2 each 2   famotidine  (PEPCID ) 40 MG tablet Take 40 mg by mouth 2 (two) times daily.     levocetirizine (XYZAL) 5 MG tablet Take 10 mg by mouth daily.     losartan (COZAAR) 100 MG tablet Take 100 mg by mouth  daily.     lubiprostone (AMITIZA) 8 MCG capsule Take 16 mcg by mouth daily with breakfast.     mirabegron  ER (MYRBETRIQ ) 50 MG TB24 tablet Take 1 tablet (50 mg total) by mouth daily. 30 tablet 11   montelukast  (SINGULAIR ) 10 MG tablet Take 1 tablet (10 mg total) by mouth at bedtime. (Patient taking differently: Take 10 mg by mouth daily.) 30 tablet 11   oxycodone  (OXY-IR) 5 MG capsule Take 5 mg by mouth every 6 (six) hours as needed for pain.     RABEprazole (ACIPHEX) 20 MG tablet Take 20 mg by mouth 2 (two) times daily.     ticagrelor  (BRILINTA ) 90 MG TABS tablet Take 1 tablet (90 mg total) by mouth 2 (two) times daily. 60 tablet 5   acetaminophen  (TYLENOL ) 500 MG tablet Take 1,000 mg by mouth every 12 (twelve) hours as needed for mild pain or moderate pain.     nitroGLYCERIN  (NITROSTAT ) 0.4 MG SL tablet Place 1 tablet (0.4 mg total) under the tongue every 5 (five) minutes as needed for chest pain. 30 tablet 3   No current facility-administered medications on file prior to visit.    Allergies  Allergen Reactions   Albuterol  Sulfate Shortness Of Breath    Other reaction(s): Other (See Comments)  can't breathe  Other reaction(s): Other (See Comments) can't breathe   Tape     Other  reaction(s): Other (See Comments), Other (See Comments), Other (See Comments)  severe skin breakdown  Cast and Bandage Cover  severe skin breakdown  severe skin breakdown   Formoterol Other (See Comments)    Other reaction(s): Headaches  Other reaction(s): Headaches  Other reaction(s): Headaches  Headaches  Other reaction(s): Headaches Other reaction(s): Headaches    Other reaction(s): Headaches    Headaches   Hydrocodone  Rash   Hydrocodone -Acetaminophen  Rash   Nickel Rash and Other (See Comments)   Other Rash    cast and bandage use  cast and bandage use  cast and bandage use  cast and bandage use    cast and bandage use cast and bandage use   Oxycodone -Acetaminophen  Other (See Comments)    headache   Salmeterol Rash   Strawberry Extract Hives    Full body rash from strawberry   Advair Hfa [Fluticasone-Salmeterol]     unknown   Proventil  Hfa [Albuterol ]     unknown   Asa [Aspirin ] Rash   Cleocin [Clindamycin] Rash   Clindamycin/Lincomycin Rash   Fludrocortisone Acetate Rash   Gabapentin Rash   Hydrochlorothiazide Rash   Imipramine Rash   Ipratropium Bromide Rash   Medroxyprogesterone Rash   Meloxicam Rash   Metformin And Related Rash   Nystatin Rash   Penicillins Rash   Pred Forte [Prednisolone] Rash   Prednisone Rash   Solu-Medrol  [Methylprednisolone ] Rash   Sulfa Antibiotics Rash   Tegaderm Ag Mesh [Silver] Rash   Toradol  [Ketorolac  Tromethamine ] Rash   Tramadol Rash    Assessment/Plan:  1. Hyperlipidemia -  Hyperlipidemia Assessment: LDL-C above goal of less than 55 due to premature disease Patient on atorvastatin  80 mg daily, latest LDL-C 140 Patient reports compliance with atorvastatin  Has plans to start exercising again Tries to maintain moderation in her diet Reviewed PCSK9 inhibitor.  Discussed mechanisms of action, dosing, side effects and potential decreases in LDL cholesterol.  Also reviewed cost information and potential options for  patient assistance.  Does have a $2000 deductible however she is already met this plus her out-of-pocket max for this year.  Will reset in December  Plan: Submit prior authorization for Repatha Continue atorvastatin  80 mg daily Labs in 3 months including ApoB and lipid panel    Thank you,  Jalysa Swopes D Oluwatomisin Hustead, Pharm.Monika Annas, CPP [facility] of [facility] [address], [place], [place] [address]  Phone: [phone]; Fax: [phone]

## 2023-12-26 NOTE — Patient Instructions (Signed)

## 2023-12-26 NOTE — Telephone Encounter (Signed)
 Ran test claim for repatha. For a 28 day supply and the co-pay is 0.00 . PA is not needed at this time. Nothing saying this is a transition fill. This test claim was processed through [facility]- copay amounts may vary at other pharmacies due to pharmacy/plan contracts, or as the patient moves through the different stages of their insurance plan.

## 2023-12-26 NOTE — Assessment & Plan Note (Signed)
 Assessment: LDL-C above goal of less than 55 due to premature disease Patient on atorvastatin  80 mg daily, latest LDL-C 140 Patient reports compliance with atorvastatin  Has plans to start exercising again Tries to maintain moderation in her diet Reviewed PCSK9 inhibitor.  Discussed mechanisms of action, dosing, side effects and potential decreases in LDL cholesterol.  Also reviewed cost information and potential options for patient assistance.  Does have a $2000 deductible however she is already met this plus her out-of-pocket max for this year.  Will reset in December  Plan: Submit prior authorization for Repatha Continue atorvastatin  80 mg daily Labs in 3 months including ApoB and lipid panel

## 2023-12-27 ENCOUNTER — Encounter (HOSPITAL_BASED_OUTPATIENT_CLINIC_OR_DEPARTMENT_OTHER): Payer: Self-pay

## 2023-12-27 ENCOUNTER — Ambulatory Visit (HOSPITAL_BASED_OUTPATIENT_CLINIC_OR_DEPARTMENT_OTHER)
Admission: EM | Admit: 2023-12-27 | Discharge: 2023-12-27 | Disposition: A | Discharge status: Home / Self Care | Attending: Family Medicine | Admitting: Family Medicine

## 2023-12-27 ENCOUNTER — Telehealth: Payer: Self-pay

## 2023-12-27 ENCOUNTER — Ambulatory Visit (HOSPITAL_BASED_OUTPATIENT_CLINIC_OR_DEPARTMENT_OTHER)
Admit: 2023-12-27 | Discharge: 2023-12-27 | Disposition: A | Discharge status: Home / Self Care | Attending: Family Medicine | Admitting: Family Medicine

## 2023-12-27 ENCOUNTER — Ambulatory Visit (HOSPITAL_BASED_OUTPATIENT_CLINIC_OR_DEPARTMENT_OTHER): Payer: Self-pay | Admitting: Family Medicine

## 2023-12-27 DIAGNOSIS — W01198A Fall on same level from slipping, tripping and stumbling with subsequent striking against other object, initial encounter: Secondary | ICD-10-CM

## 2023-12-27 DIAGNOSIS — Y93K1 Activity, walking an animal: Secondary | ICD-10-CM

## 2023-12-27 DIAGNOSIS — S4992XA Unspecified injury of left shoulder and upper arm, initial encounter: Secondary | ICD-10-CM

## 2023-12-27 DIAGNOSIS — M25512 Pain in left shoulder: Secondary | ICD-10-CM | POA: Diagnosis not present

## 2023-12-27 MED ORDER — REPATHA SURECLICK 140 MG/ML ~~LOC~~ SOAJ: 1.0000 mL | SUBCUTANEOUS | 11 refills | Status: AC

## 2023-12-27 NOTE — Telephone Encounter (Signed)
   Patient Name: Casey [name]  DOB: Jul 30, 1971 MRN: [id number]  Primary Cardiologist: Olinda Bertrand, DO  Chart reviewed as part of pre-operative protocol coverage. Cataract extractions are recognized in guidelines as low risk surgeries that do not typically require specific preoperative testing or holding of blood thinner therapy. Therefore, given past medical history and time since last visit, based on ACC/AHA guidelines, LATERIA ALDERMAN would be at acceptable risk for the planned procedure without further cardiovascular testing.   I will route this recommendation to the requesting party via Epic fax function and remove from pre-op pool.  Please call with questions.  Ava Boatman, NP 12/27/2023, 5:03 PM

## 2023-12-27 NOTE — Addendum Note (Signed)
 Addended by: Zoeya Gramajo D on: 12/27/2023 01:36 PM   Modules accepted: Orders

## 2023-12-27 NOTE — Telephone Encounter (Signed)
   Pre-operative Risk Assessment    Patient Name: Casey [name]  DOB: [birth year] MRN: [id number]   Date of last office visit: 12/26/23 Date of next office visit: Not scheduled   Request for Surgical Clearance    Procedure:  Cataract extraction by PE, IOL- Left then right  Date of Surgery:  Clearance 01/11/24                                Surgeon:  Dr. Nicklas Barns Surgeon's Group or Practice Name:  [facility] Phone number:  [phone] Fax number:  [phone]   Type of Clearance Requested:   - Medical    Type of Anesthesia:  IV sedation   Additional requests/questions:    Gardiner Jumper   12/27/2023, 12:53 PM

## 2023-12-27 NOTE — Discharge Instructions (Addendum)
 I will call you if there is any concerns on xray. Otherwise I will send to your mychart.  Rest, Ice the areas.  Pain meds as needed.  Follow up with orthopedics as planned.

## 2023-12-27 NOTE — ED Triage Notes (Signed)
 Was pulled by great pyrenese dog and fell into a post. Left shoulder/collar bone pain. + discoloration. Limited ROM. Took her home oxycodone  5mg  for pain.

## 2023-12-28 ENCOUNTER — Ambulatory Visit (INDEPENDENT_AMBULATORY_CARE_PROVIDER_SITE_OTHER)

## 2023-12-28 DIAGNOSIS — J309 Allergic rhinitis, unspecified: Secondary | ICD-10-CM

## 2023-12-30 NOTE — ED Provider Notes (Signed)
 [name]    CSN: [id number] Arrival date & time: 12/27/23  1310      History   Chief Complaint Chief Complaint  Patient presents with   Shoulder Injury    HPI Casey [name] is a 53 y.o. female.   Patient is a 53 year old female presents today with left shoulder and clavicle injury.  She was pulled down by her dog and fell into a post.  She is having left shoulder bruising, swelling and pain along with clavicle pain.  She is able to move the arm but is limited.  She took some oxycodone  that she had at home for pain which helped.  She has been icing the area.  She is here to sure there is no fracture.   Shoulder Injury    Past Medical History:  Diagnosis Date   Albuminuria 06/27/2015   Anxiety 11/04/2014   Arm DVT (deep venous thromboembolism), acute, left (HCC) 08/17/2022   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/30/2014   Asthma    Atopic dermatitis 10/30/2008   Formatting of this note might be different from the original. Atopic dermatitis Formatting of this note might be different from the original. Formatting of this note might be different from the original. Atopic dermatitis Formatting of this note might be different from the original. Formatting of this note might be different from the original. Formatting of this note might be different from the or   Cataract 05/06/2015   Contusion of left wrist 07/08/2021   Coronary artery disease s/p PCI/DES to LAD 07/01/2023 12/28/2022   Crohn's disease (HCC) 04/21/2015   De Quervain's tenosynovitis, left 07/31/2021   Diabetes mellitus without complication (HCC)    Diabetes type 2, controlled (HCC) 04/21/2015   Dry eyes 05/06/2015   DVT (deep venous thrombosis) (HCC)    Dysfunction of both eustachian tubes 08/31/2012   Encounter for monitoring immunomodulating therapy 11/20/2021   Environmental allergies 08/12/2020   Esophageal candidiasis (HCC) 06/26/2022   Essential hypertension 06/27/2015    Fibrositis 08/28/2014   Formatting of this note might be different from the original. Fibromyalgia Formatting of this note might be different from the original. Formatting of this note might be different from the original. Fibromyalgia   Flank pain 11/24/2020   Gastroesophageal reflux disease with esophagitis without hemorrhage 08/01/2019   Formatting of this note might be different from the original. Formatting of this note might be different from the original. 07/2019: chronic symptoms of esophageal reflux since prior to sleeve gastrectomy surgery. Symptoms have worsened over the last 6 months with recent EGD findings of grade B esophagitis, irregular z line, erythematous mucosa, and evidence of sleeve gastrectomy. 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 Formatting of this note might be different from the original. 07/2019: 8 mm colonic polyp removed during colonoscopy, biopsy showed serrated polyp. Repeat colonoscopy recommended in one year. Formatting of this note might be different from the original. Added automatically from request for surgery [id number] Formatting of this note might b   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   History of kidney stones 08/12/2020   Hypercholesterolemia 10/07/2022   Hypertension, essential, benign 06/27/2015   Hypertensive disorder 08/28/2014   Formatting of this note might be different from the  original. Hypertension Formatting of this note might be different from the original. Formatting of this note might be different from the original. 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 Keratoconus Formatting of this note might be different from the original. Formatting of this note might be different from the original. Keratoconus Formatting of this note might be different from the original. Formatting of this note might be different from the original. Keratoconus Formatting of this note might be different from the or   Microhematuria 06/27/2015   Migraine 08/28/2014   Formatting of this note might be different from the original. Migraine Formatting of this note might be different from the original. Formatting of this note might be different from the original. Migraine Formatting of this note might be different from the original. Migraine Formatting of this note might be different from the original. Migraine Formatting of this note might be different from the or   Migraine without aura and without status migrainosus, not intractable 08/28/2014   Formatting of this note might be different from the original. Formatting of this note might be different from the original. Formatting of this note might be different from the original. Migraine Formatting of this note might be different from the original. Migraine Formatting of this note might be different from the original. Formatting of this note might be different from the original. Migraine F   Mild intermittent asthma without complication 10/30/2008   Formatting of this note might be different from the original. Formatting of this note might be different from the original. Formatting of this note might be different from the original. Asthma Formatting of this note might be different from the original. Asthma Formatting of this note might be different from the original. Formatting of this note might be different from the original. 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 Formatting of this note might be different from the original. 01/25/2016 with Dr. Durrell Gilles at [facility]. [place] of this note might be different from the original. 01/25/2016 with Dr. Durrell Gilles at [facility]. [place] of this note might be different from the original. Formatting of this note might be different from    Seronegative inflammatory arthritis 08/28/2014   Formatting of this note might be different from the original. Formatting of this note might be different from the original. Arthritis; diagnosed as IBD related - 2012 Formatting of this note might be different from the original. Followed by rheumatologist. Had to change providers due to an insurance change earlier this year resulting in patient not being able to have Remicade  for ~6 months. [place]   Sero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 arthritis (HCC)    Slow transit constipation 08/01/2019   Formatting of this note might be different from  the original. Formatting of this note might be different from the original. Treated with PRN miralax . Recent colonoscopy 07/2019. Formatting of this note might be different from the original. Treated with PRN miralax . Recent colonoscopy 07/2019. Formatting of this note might be different from the original. Formatting of this note might be different f   Status post placement of ureteral stent 02/06/2021   Tooth infection 08/12/2020   Unstable angina (HCC)  07/29/2023   Ureteral stone 01/20/2022   Urinary tract infection, site not specified 06/26/2022    Patient Active Problem List   Diagnosis Date Noted   Other chronic pain 12/14/2023   Concussion with loss of consciousness 10/11/2023   Hypokalemia 09/21/2023   Cellulitis 09/16/2023   Bilateral lower extremity edema 09/16/2023   Unstable angina (HCC) 07/29/2023   Moderate persistent asthma 07/29/2023   History of DVT (deep vein thrombosis) 06/30/2023   Chest pain, rule out acute myocardial infarction 06/29/2023   Nocturnal hypoxemia 06/13/2023   Oropharyngeal candidiasis 01/02/2023   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   Coronary artery disease s/p PCI/DES to LAD 07/01/2023 12/28/2022   Precordial chest pain 10/13/2022   Hyperlipidemia 10/07/2022   Hiatal hernia 10/07/2022   Esophageal candidiasis (HCC) 06/26/2022   Urinary tract infection, site not specified 06/26/2022   Ureteral stone 01/20/2022   Encounter for monitoring immunomodulating therapy 11/20/2021   Rheumatoid arthritis of multiple sites with 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 factor (HCC) 11/16/2021   Pyelonephritis 09/25/2021   De Quervain's tenosynovitis, left 07/31/2021   Contusion of left wrist 07/08/2021   Status post placement of ureteral stent 02/06/2021   Proteinuria 08/12/2020   Environmental allergies 08/12/2020   History of kidney stones 08/12/2020   Tooth infection 08/12/2020   Gastroesophageal reflux disease with esophagitis without hemorrhage 08/01/2019   Slow transit constipation 08/01/2019   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   History of colon polyps 06/19/2019   Irregular astigmatism of both eyes 10/11/2018   Nephrolithiasis 09/08/2018   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   Plantar wart 11/04/2016   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   Myopia with astigmatism and presbyopia 05/07/2016   Overactive bladder 11/18/2015   Incontinence 10/07/2015   Albuminuria  06/27/2015   Essential hypertension 06/27/2015   Microhematuria 06/27/2015   Hematuria 06/18/2015   Cataract 05/06/2015   Dry eyes 05/06/2015   Crohn's disease (HCC) 04/21/2015   Osteoarthritis of both knees 04/21/2015   Right knee pain 04/21/2015   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/30/2014   History of abnormal cervical Pap smear 11/29/2014   Fibrositis 08/28/2014   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   Migraine without aura and without status migrainosus, not intractable 08/28/2014   Migraine 08/28/2014   Seronegative inflammatory arthritis 08/28/2014   Increased urinary frequency 07/16/2013   Dysfunction of both eustachian tubes 08/31/2012   Asthma 10/30/2008   Mild intermittent asthma without complication 10/30/2008    Past Surgical History:  Procedure Laterality Date   BACK SURGERY     L5-S1   CERVICAL CONE BIOPSY  04/2000   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   CORONARY STENT INTERVENTION N/A 07/01/2023   Procedure: CORONARY STENT INTERVENTION;  Surgeon: Wenona Hamilton, MD;  Location: [facility];  Service: Cardiovascular;  Laterality: N/A;   EYE SURGERY     GASTRIC BYPASS  2017   Sleve   LAPAROSCOPIC GASTRIC SLEEVE RESECTION     LEFT HEART CATH AND CORONARY ANGIOGRAPHY N/A 07/01/2023   Procedure: LEFT HEART CATH AND CORONARY ANGIOGRAPHY;  Surgeon:  Wenona Hamilton, MD;  Location: [facility];  Service: Cardiovascular;  Laterality: N/A;   LEFT HEART CATH AND CORONARY ANGIOGRAPHY N/A 08/01/2023   Procedure: LEFT HEART CATH AND CORONARY ANGIOGRAPHY;  Surgeon: Knox Perl, MD;  Location: [facility];  Service: Cardiovascular;  Laterality: N/A;   Low back disc surgery     06/2000   TOTAL KNEE ARTHROPLASTY Right 11/2016    OB History     Gravida  0   Para  0   Term  0   Preterm  0   AB  0   Living  0      SAB  0   IAB  0   Ectopic  0   Multiple  0   Live Births  0            Home Medications    Prior to  Admission medications   Medication Sig Start Date End Date Taking? Authorizing Provider  acetaminophen  (TYLENOL ) 500 MG tablet Take 1,000 mg by mouth every 12 (twelve) hours as needed for mild pain or moderate pain.    [provider]  albuterol  (PROVENTIL ) (2.5 MG/3ML) 0.083% nebulizer solution Take 3 mLs (2.5 mg total) by nebulization every 6 (six) hours as needed for wheezing or shortness of breath. 10/13/23   Everlyn Hockey, PA-C  atorvastatin  (LIPITOR) 80 MG tablet Take 1 tablet (80 mg total) by mouth daily. 09/19/23   Krasowski, Robert J, MD  B Complex-C (B-COMPLEX WITH VITAMIN C) tablet Take 1 tablet by mouth daily. 09/06/19   [provider]  Cholecalciferol  (VITAMIN D3) 50 MCG (2000 UT) TABS Take 1 tablet by mouth daily.    [provider]  EPINEPHrine  (EPIPEN  2-PAK) 0.3 mg/0.3 mL IJ SOAJ injection Inject 0.3 mg into the muscle as needed for anaphylaxis. 01/18/23   Brian Campanile, MD  Evolocumab (REPATHA SURECLICK) 140 MG/ML SOAJ Inject 140 mg into the skin every 14 (fourteen) days. 12/27/23   Tolia, Sunit, DO  famotidine  (PEPCID ) 40 MG tablet Take 40 mg by mouth 2 (two) times daily. 12/20/23 12/19/24  [provider]  levocetirizine (XYZAL) 5 MG tablet Take 10 mg by mouth daily.    [provider]  losartan (COZAAR) 100 MG tablet Take 100 mg by mouth daily. 12/08/23 12/07/24  [provider]  lubiprostone (AMITIZA) 8 MCG capsule Take 16 mcg by mouth daily with breakfast. 10/07/23 04/04/24  [provider]  mirabegron  ER (MYRBETRIQ ) 50 MG TB24 tablet Take 1 tablet (50 mg total) by mouth daily. 11/30/22   Adra Alanis, FNP  montelukast  (SINGULAIR ) 10 MG tablet Take 1 tablet (10 mg total) by mouth at bedtime. Patient taking differently: Take 10 mg by mouth daily. 11/30/22   Adra Alanis, FNP  nitroGLYCERIN  (NITROSTAT ) 0.4 MG SL tablet Place 1 tablet (0.4 mg total) under the tongue every 5 (five) minutes as needed  for chest pain. 12/13/23   Tolia, Sunit, DO  oxycodone  (OXY-IR) 5 MG capsule Take 5 mg by mouth every 6 (six) hours as needed for pain.    [provider]  RABEprazole (ACIPHEX) 20 MG tablet Take 20 mg by mouth 2 (two) times daily.    [provider]  ticagrelor  (BRILINTA ) 90 MG TABS tablet Take 1 tablet (90 mg total) by mouth 2 (two) times daily. 07/01/23   Abbe Abate, MD    Family History Family History  Adopted: Yes  Problem Relation Age of Onset  Diabetes Mother    Hypertension Mother    Diabetes Father    Hypertension Father    Hypertension Sister    Diabetes Sister    Hypertension Brother    Diabetes Brother    Diabetes Paternal Grandmother    Cancer Other    Asthma Neg Hx    Allergic rhinitis Neg Hx    Atopy Neg Hx    Eczema Neg Hx     Social History Social History   Tobacco Use   Smoking status: Never    Passive exposure: Never   Smokeless tobacco: Never  Vaping Use   Vaping status: Never Used  Substance Use Topics   Alcohol use: Yes    Comment: rare   Drug use: Never     Allergies   Albuterol  sulfate, Tape, Formoterol, Hydrocodone , Hydrocodone -acetaminophen , Nickel, Other, Oxycodone -acetaminophen , Salmeterol, Strawberry extract, Advair hfa [fluticasone-salmeterol], Proventil  hfa [albuterol ], Asa [aspirin ], Cleocin [clindamycin], Clindamycin/lincomycin, Fludrocortisone acetate, Gabapentin, Hydrochlorothiazide, Imipramine, Ipratropium bromide, Medroxyprogesterone, Meloxicam, Metformin and related, Nystatin, Penicillins, Pred forte [prednisolone], Prednisone, Solu-medrol  [methylprednisolone ], Sulfa antibiotics, Tegaderm ag mesh [silver], Toradol  [ketorolac  tromethamine ], and Tramadol   Review of Systems Review of Systems See HPI  Physical Exam Triage Vital Signs ED Triage Vitals  Encounter Vitals Group     BP 12/27/23 1349 (!) 142/86     Systolic BP Percentile --      Diastolic BP Percentile --      Pulse Rate 12/27/23 1349 77      Resp 12/27/23 1349 20     Temp 12/27/23 1349 (!) 97.4 F (36.3 C)     Temp Source 12/27/23 1349 Oral     SpO2 12/27/23 1349 98 %     Weight --      Height --      Head Circumference --      Peak Flow --      Pain Score 12/27/23 1351 6     Pain Loc --      Pain Education --      Exclude from Growth Chart --    No data found.  Updated Vital Signs BP (!) 142/86 (BP Location: Right Arm)   Pulse 77   Temp (!) 97.4 F (36.3 C) (Oral)   Resp 20   SpO2 98%   Visual Acuity Right Eye Distance:   Left Eye Distance:   Bilateral Distance:    Right Eye Near:   Left Eye Near:    Bilateral Near:     Physical Exam Chest:        UC Treatments / Results  Labs (all labs ordered are listed, but only abnormal results are displayed) Labs Reviewed - No data to display  EKG   Radiology No results found.  Procedures Procedures (including critical care time)  Medications Ordered in UC Medications - No data to display  Initial Impression / Assessment and Plan / UC Course  I have reviewed the triage vital signs and the nursing notes.  Pertinent labs & imaging results that were available during my care of the patient were reviewed by me and considered in my medical decision making (see chart for details).     Injury to left upper extremity.-Patient with injury to left clavicle and shoulder after a fall.  X-rays here revealed no acute fractures.  Recommend rest and ice the areas.  She can take her pain medication at home as needed. Follow-up with orthopedic for any continued issues Final Clinical Impressions(s) / UC Diagnoses   Final diagnoses:  Injury of left upper extremity, initial encounter     Discharge Instructions      I will call you if there is any concerns on xray. Otherwise I will send to your mychart.  Rest, Ice the areas.  Pain meds as needed.  Follow up with orthopedics as planned.    ED Prescriptions   None    PDMP not reviewed this  encounter.   Landa Pine, FNP 12/30/23 [phone]

## 2024-01-03 ENCOUNTER — Encounter: Payer: Self-pay | Admitting: Family

## 2024-01-03 ENCOUNTER — Ambulatory Visit (INDEPENDENT_AMBULATORY_CARE_PROVIDER_SITE_OTHER)

## 2024-01-03 ENCOUNTER — Telehealth: Payer: Self-pay | Admitting: Family

## 2024-01-03 ENCOUNTER — Ambulatory Visit (INDEPENDENT_AMBULATORY_CARE_PROVIDER_SITE_OTHER): Admitting: Family

## 2024-01-03 DIAGNOSIS — S4992XA Unspecified injury of left shoulder and upper arm, initial encounter: Secondary | ICD-10-CM

## 2024-01-03 DIAGNOSIS — J309 Allergic rhinitis, unspecified: Secondary | ICD-10-CM

## 2024-01-03 MED ORDER — OXYCODONE HCL 5 MG PO CAPS: 5.0000 mg | ORAL_CAPSULE | Freq: Four times a day (QID) | ORAL | 0 refills | Status: DC | PRN

## 2024-01-03 NOTE — Progress Notes (Signed)
 Office Visit Note   Patient: Casey [name]           Date of Birth: 10-17-1970           MRN: [id number] Visit Date: 01/03/2024              Requested by: Cherylene Corrente, PA-C [address] [place],  [place] [address] PCP: Cherylene Corrente, PA-C  Chief Complaint  Patient presents with   Left Shoulder - Pain      HPI: The patient is a 53 year old woman who presents today complaining of left shoulder pain after a fall she landed with direct impact against a post on her anterior left shoulder she is also having symptoms scapular pain after the fall she fell down some stairs  Has had bruising and pain loss of range of motion cannot reach above head or behind back  Assessment & Plan: Visit Diagnoses: No diagnosis found.  Plan: suspected RC injury/tearing. MRI Left shoulder as well as PT. Progressive ROM. Note for work accommodations provided.  Follow-Up Instructions: No follow-ups on file.   Left Shoulder Exam   Tenderness  The patient is experiencing tenderness in the biceps tendon, clavicle and acromioclavicular joint.  Range of Motion  Active abduction:  80  External rotation:  abnormal   Muscle Strength  Biceps: 3/5   Tests  Drop arm: negative  Other  Erythema: absent Pulse: present       Patient is alert, oriented, no adenopathy, well-dressed, normal affect, normal respiratory effort.   Imaging: No results found. No images are attached to the encounter.  Labs: Lab Results  Component Value Date   HGBA1C 6.0 (H) 09/06/2023   HGBA1C 5.8 11/30/2022   ESRSEDRATE 46 (H) 10/24/2023   ESRSEDRATE 40 (H) 07/31/2023   CRP 0.8 07/31/2023   REPTSTATUS 11/16/2023 FINAL 11/10/2023   REPTSTATUS 11/16/2023 FINAL 11/10/2023   CULT  11/10/2023    NO GROWTH 5 DAYS Performed at [facility], [address], [place], [place] [address]    CULT  11/10/2023    NO GROWTH 5 DAYS Performed at [facility], [address], [place], [place] [address]     LABORGA ESCHERICHIA COLI (A) 07/30/2023     Lab Results  Component Value Date   ALBUMIN 4.2 11/30/2023   ALBUMIN 4.3 11/19/2023   ALBUMIN 3.7 11/10/2023    Lab Results  Component Value Date   MG 2.0 11/30/2023   MG 1.8 09/09/2023   MG 1.9 07/31/2023   No results found for: "VD25OH"  No results found for: "PREALBUMIN"    Latest Ref Rng & Units 11/30/2023    5:59 PM 11/19/2023    9:27 PM 11/15/2023   10:16 AM  CBC EXTENDED  WBC 4.0 - 10.5 K/uL 12.0  16.2  14.5   RBC 3.87 - 5.11 MIL/uL 4.30  4.67  4.56   Hemoglobin 12.0 - 15.0 g/dL 64.4  03.4  74.2   HCT 36.0 - 46.0 % 33.9  37.0  36.6   Platelets 150 - 400 K/uL 379  422  425   NEUT# 1,500 - 7,800 cells/uL   10,310      There is no height or weight on file to calculate BMI.  Orders:  No orders of the defined types were placed in this encounter.  No orders of the defined types were placed in this encounter.    Procedures: No procedures performed  Clinical Data: No additional findings.  ROS:  All other systems negative, except as noted in the HPI. Review of Systems  Objective: Vital Signs: There were no vitals taken for this visit.  Specialty Comments:  No specialty comments available.  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   Coronary artery disease s/p PCI/DES to LAD 07/01/2023 12/28/2022   Precordial chest pain 10/13/2022   Hyperlipidemia 10/07/2022   Hiatal hernia 10/07/2022   Esophageal  candidiasis (HCC) 06/26/2022   Urinary tract infection, site not specified 06/26/2022   Ureteral stone 01/20/2022   Encounter for monitoring immunomodulating therapy 11/20/2021   Rheumatoid arthritis of multiple sites with 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 factor (HCC) 11/16/2021   Pyelonephritis 09/25/2021   De Quervain's tenosynovitis, left 07/31/2021   Contusion of left wrist 07/08/2021   Status post placement of ureteral stent 02/06/2021   Proteinuria 08/12/2020   Environmental allergies 08/12/2020   History of kidney stones 08/12/2020   Tooth infection 08/12/2020   Gastroesophageal reflux disease with esophagitis without hemorrhage 08/01/2019   Slow transit constipation 08/01/2019   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   History of colon polyps 06/19/2019   Irregular astigmatism of both eyes 10/11/2018   Nephrolithiasis 09/08/2018   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   Plantar wart 11/04/2016   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   Myopia with astigmatism and presbyopia 05/07/2016   Overactive bladder 11/18/2015   Incontinence 10/07/2015   Albuminuria 06/27/2015   Essential hypertension 06/27/2015   Microhematuria 06/27/2015   Hematuria 06/18/2015   Cataract 05/06/2015   Dry eyes 05/06/2015   Crohn's disease (HCC) 04/21/2015   Osteoarthritis of both knees 04/21/2015   Right knee pain 04/21/2015   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/30/2014   History of abnormal cervical Pap smear 11/29/2014   Fibrositis 08/28/2014   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   Migraine without aura and without status migrainosus, not intractable 08/28/2014   Migraine 08/28/2014   Seronegative inflammatory arthritis 08/28/2014   Increased urinary frequency 07/16/2013   Dysfunction of both eustachian tubes 08/31/2012   Asthma 10/30/2008   Mild intermittent asthma without complication 10/30/2008   Past Medical History:  Diagnosis Date   Albuminuria 06/27/2015    Anxiety 11/04/2014   Arm DVT (deep venous thromboembolism), acute, left (HCC) 08/17/2022   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/30/2014   Asthma    Atopic dermatitis 10/30/2008   Formatting of this note might be different from the original. Atopic dermatitis Formatting of this note might be different from the original. Formatting of this note might be different from the original. Atopic dermatitis Formatting of this note might be different from the original. Formatting of this note might be different from the original. Formatting of this note might be different from the or   Cataract 05/06/2015   Contusion of left wrist 07/08/2021   Coronary artery disease s/p PCI/DES to LAD 07/01/2023 12/28/2022   Crohn's disease (HCC) 04/21/2015   De Quervain's tenosynovitis, left 07/31/2021   Diabetes mellitus without complication (HCC)    Diabetes type 2, controlled (HCC) 04/21/2015  Dry eyes 05/06/2015   DVT (deep venous thrombosis) (HCC)    Dysfunction of both eustachian tubes 08/31/2012   Encounter for monitoring immunomodulating therapy 11/20/2021   Environmental allergies 08/12/2020   Esophageal candidiasis (HCC) 06/26/2022   Essential hypertension 06/27/2015   Fibrositis 08/28/2014   Formatting of this note might be different from the original. Fibromyalgia Formatting of this note might be different from the original. Formatting of this note might be different from the original. Fibromyalgia   Flank pain 11/24/2020   Gastroesophageal reflux disease with esophagitis without hemorrhage 08/01/2019   Formatting of this note might be different from the original. Formatting of this note might be different from the original. 07/2019: chronic symptoms of esophageal reflux since prior to sleeve gastrectomy surgery. Symptoms have worsened over the last 6 months with recent EGD findings of grade B esophagitis, irregular z line, erythematous mucosa, and evidence of sleeve gastrectomy.  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 Formatting of this note might be different from the original. 07/2019: 8 mm colonic polyp removed during colonoscopy, biopsy showed serrated polyp. Repeat colonoscopy recommended in one year. Formatting of this note might be different from the original. Added automatically from request for surgery [phone] Formatting of this note might b   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   History of kidney stones 08/12/2020   Hypercholesterolemia 10/07/2022   Hypertension, essential, benign 06/27/2015   Hypertensive disorder 08/28/2014   Formatting of this note might be different from the original. Hypertension Formatting of this note might be different from the original. Formatting of this note might be different from the original. 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 Keratoconus Formatting of this note might be different from the original. Formatting of this note might be different from the original. Keratoconus Formatting of this note might be different from the original. Formatting of this note might be different from the original. Keratoconus Formatting of this note might be different from the or   Microhematuria 06/27/2015   Migraine 08/28/2014   Formatting of this note might be different from the original. Migraine Formatting of this note might be different from the original. Formatting of this note might be different from the original. Migraine Formatting of this note might be different from the original. Migraine Formatting of this  note might be different from the original. Migraine Formatting of this note might be different from the or   Migraine without aura and without status migrainosus, not intractable 08/28/2014   Formatting of this note might be different from the original. Formatting of this note might be different from the original. Formatting of this note might be different from the original. Migraine Formatting of this note might be different from the original. Migraine Formatting of this note might be different from the original. Formatting of this note might be different from the original. Migraine F   Mild intermittent asthma without complication 10/30/2008   Formatting of this note might be different from the original. Formatting of this note might be different from the original. Formatting of this note might be different  from the original. Asthma Formatting of this note might be different from the original. Asthma Formatting of this note might be different from the original. Formatting of this note might be different from the original. 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 Formatting of this note might be different from the original. 01/25/2016 with Dr. Durrell Gilles at [facility]. [place] of this note might be different from the original.  01/25/2016 with Dr. Durrell Gilles at [facility]. [place] of this note might be different from the original. Formatting of this note might be different from    Seronegative inflammatory arthritis 08/28/2014   Formatting of this note might be different from the original. Formatting of this note might be different from the original. Arthritis; diagnosed as IBD related - 2012 Formatting of this note might be different from the original. Followed by rheumatologist. Had to change providers due to an insurance change earlier this year resulting in patient not being able to have Remicade  for ~6 months. 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 Formatting of this note might be different from the original. Treated with PRN miralax . Recent colonoscopy 07/2019. Formatting of this note might be different from the original. Treated with PRN miralax . Recent colonoscopy 07/2019. Formatting of this note might be different from the original. Formatting of this note might be different f   Status post placement of ureteral stent 02/06/2021   Tooth infection 08/12/2020   Unstable angina (HCC) 07/29/2023   Ureteral stone 01/20/2022   Urinary tract infection, site not specified 06/26/2022    Family History  Adopted: Yes  Problem Relation Age of Onset   Diabetes Mother    Hypertension Mother    Diabetes Father    Hypertension Father    Hypertension Sister    Diabetes Sister    Hypertension Brother    Diabetes Brother    Diabetes Paternal Grandmother    Cancer Other    Asthma Neg Hx    Allergic rhinitis Neg Hx    Atopy Neg Hx    Eczema Neg Hx     Past Surgical History:  Procedure Laterality Date   BACK SURGERY     L5-S1   CERVICAL CONE BIOPSY  04/2000   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   CORONARY STENT INTERVENTION N/A 07/01/2023   Procedure: CORONARY STENT INTERVENTION;  Surgeon: Wenona Hamilton, MD;  Location: [facility];  Service: Cardiovascular;  Laterality: N/A;   EYE SURGERY     GASTRIC BYPASS  2017   Sleve   LAPAROSCOPIC GASTRIC SLEEVE RESECTION     LEFT HEART CATH AND CORONARY ANGIOGRAPHY N/A 07/01/2023   Procedure: LEFT HEART CATH AND CORONARY ANGIOGRAPHY;  Surgeon: Wenona Hamilton, MD;  Location: [facility];  Service: Cardiovascular;  Laterality: N/A;   LEFT HEART CATH AND CORONARY ANGIOGRAPHY N/A 08/01/2023   Procedure: LEFT HEART CATH AND CORONARY ANGIOGRAPHY;  Surgeon: Knox Perl, MD;  Location: [facility];  Service: Cardiovascular;  Laterality: N/A;   Low back disc surgery     06/2000   TOTAL KNEE ARTHROPLASTY Right 11/2016   Social History   Occupational History   Not on file  Tobacco Use   Smoking status: Never    Passive exposure: Never   Smokeless tobacco: Never  Vaping Use   Vaping status: Never Used  Substance and Sexual Activity   Alcohol use: Yes    Comment: rare   Drug use: Never   Sexual activity: Yes

## 2024-01-03 NOTE — Telephone Encounter (Signed)
 [facility] pharmacy called to switch the oxycodone  capsules to tablets. Best call back [phone]

## 2024-01-03 NOTE — Telephone Encounter (Signed)
 SW Donnie at [facility] and gave the okay to switch to tablet form.

## 2024-01-05 ENCOUNTER — Encounter: Payer: Self-pay | Admitting: Physician Assistant

## 2024-01-05 ENCOUNTER — Ambulatory Visit: Admitting: Physician Assistant

## 2024-01-05 DIAGNOSIS — M25512 Pain in left shoulder: Secondary | ICD-10-CM

## 2024-01-05 NOTE — Progress Notes (Signed)
 Office Visit Note   Patient: Casey [name]           Date of Birth: 02-16-1971           MRN: [id number] Visit Date: 01/05/2024              Requested by: Cherylene Corrente, PA-C [address] [place],  [place] [address] PCP: Cherylene Corrente, PA-C  No chief complaint on file.     HPI: Patient is status post fall onto left shoulder.  She was seen and evaluated in urgent care as well is with Felicity Horseman 2 days ago.  [place] sure there were no fractures however concern for rotator cuff and MRI has been ordered.  She is wondering if she could have a steroid injection.  Assessment & Plan: Visit Diagnoses:  1. Acute pain of left shoulder     Plan: I had a long discussion with the patient today.  I would be hesitant to give her a steroid injection today as she has an upcoming MRI with that would alter the MRI also concerned if she did have an surgical issue steroid in her joint would not be appropriate for 8 weeks.  She understands that she is having difficulty sleeping I recommended more reclining in an easy chair and trying more pillows as well as Tylenol  PM melatonin.  She is in agreement with this plan  Follow-Up Instructions: With Erin after MRI  Ortho Exam  Patient is alert, oriented, no adenopathy, well-dressed, normal affect, normal respiratory effort. Weakness with forward elevation internal rotation behind the back.  She has resolving ecchymosis about her shoulder.  Exam very similar to that of 2 days ago  Imaging: No results found. No images are attached to the encounter.  Labs: Lab Results  Component Value Date   HGBA1C 6.0 (H) 09/06/2023   HGBA1C 5.8 11/30/2022   ESRSEDRATE 46 (H) 10/24/2023   ESRSEDRATE 40 (H) 07/31/2023   CRP 0.8 07/31/2023   REPTSTATUS 11/16/2023 FINAL 11/10/2023   REPTSTATUS 11/16/2023 FINAL 11/10/2023   CULT  11/10/2023    NO GROWTH 5 DAYS Performed at [facility], [address] [place], [place] [address]    CULT  11/10/2023    NO  GROWTH 5 DAYS Performed at [facility], [address], [place], [place] [address]    LABORGA ESCHERICHIA COLI (A) 07/30/2023     Lab Results  Component Value Date   ALBUMIN 4.2 11/30/2023   ALBUMIN 4.3 11/19/2023   ALBUMIN 3.7 11/10/2023    Lab Results  Component Value Date   MG 2.0 11/30/2023   MG 1.8 09/09/2023   MG 1.9 07/31/2023   No results found for: "VD25OH"  No results found for: "PREALBUMIN"    Latest Ref Rng & Units 11/30/2023    5:59 PM 11/19/2023    9:27 PM 11/15/2023   10:16 AM  CBC EXTENDED  WBC 4.0 - 10.5 K/uL 12.0  16.2  14.5   RBC 3.87 - 5.11 MIL/uL 4.30  4.67  4.56   Hemoglobin 12.0 - 15.0 g/dL 01.0  27.2  53.6   HCT 36.0 - 46.0 % 33.9  37.0  36.6   Platelets 150 - 400 K/uL 379  422  425   NEUT# 1,500 - 7,800 cells/uL   10,310      There is no height or weight on file to calculate BMI.  Orders:  No orders of the defined types were placed in this encounter.  No orders of the defined types were placed in this encounter.    Procedures: No procedures performed  Clinical Data: No additional findings.  ROS:  All other systems negative, except as noted in the HPI. Review of Systems  Objective: Vital Signs: There were no vitals taken for this visit.  Specialty Comments:  No specialty comments available.  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   Coronary artery disease s/p PCI/DES to LAD  07/01/2023 12/28/2022   Precordial chest pain 10/13/2022   Hyperlipidemia 10/07/2022   Hiatal hernia 10/07/2022   Esophageal candidiasis (HCC) 06/26/2022   Urinary tract infection, site not specified 06/26/2022   Ureteral stone 01/20/2022   Encounter for monitoring immunomodulating therapy 11/20/2021   Rheumatoid arthritis of multiple sites with 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 factor (HCC) 11/16/2021   Pyelonephritis 09/25/2021   De Quervain's tenosynovitis, left 07/31/2021   Contusion of left wrist 07/08/2021   Status post placement of ureteral stent 02/06/2021   Proteinuria 08/12/2020   Environmental allergies 08/12/2020   History of kidney stones 08/12/2020   Tooth infection 08/12/2020   Gastroesophageal reflux disease with esophagitis without hemorrhage 08/01/2019   Slow transit constipation 08/01/2019   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   History of colon polyps 06/19/2019   Irregular astigmatism of both eyes 10/11/2018   Nephrolithiasis 09/08/2018   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   Plantar wart 11/04/2016   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   Myopia with astigmatism and presbyopia 05/07/2016   Overactive bladder 11/18/2015   Incontinence 10/07/2015   Albuminuria 06/27/2015   Essential hypertension 06/27/2015   Microhematuria 06/27/2015   Hematuria 06/18/2015   Cataract 05/06/2015   Dry eyes 05/06/2015   Crohn's disease (HCC) 04/21/2015   Osteoarthritis of both knees 04/21/2015   Right knee pain 04/21/2015   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/30/2014   History of abnormal cervical Pap smear 11/29/2014   Fibrositis 08/28/2014   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   Migraine without aura and without status migrainosus, not intractable 08/28/2014   Migraine 08/28/2014   Seronegative inflammatory arthritis 08/28/2014   Increased urinary frequency 07/16/2013   Dysfunction of both eustachian tubes 08/31/2012   Asthma 10/30/2008   Mild  intermittent asthma without complication 10/30/2008   Past Medical History:  Diagnosis Date   Albuminuria 06/27/2015   Anxiety 11/04/2014   Arm DVT (deep venous thromboembolism), acute, left (HCC) 08/17/2022   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/30/2014   Asthma    Atopic dermatitis 10/30/2008   Formatting of this note might be different from the original. Atopic dermatitis Formatting of this note might be different from the original. Formatting of this note might be different from the original. Atopic dermatitis Formatting of this note might be different from the original. Formatting of this note might be different from the original. Formatting of this note might be different from the or   Cataract 05/06/2015   Contusion of left wrist 07/08/2021   Coronary artery disease s/p  PCI/DES to LAD 07/01/2023 12/28/2022   Crohn's disease (HCC) 04/21/2015   De Quervain's tenosynovitis, left 07/31/2021   Diabetes mellitus without complication (HCC)    Diabetes type 2, controlled (HCC) 04/21/2015   Dry eyes 05/06/2015   DVT (deep venous thrombosis) (HCC)    Dysfunction of both eustachian tubes 08/31/2012   Encounter for monitoring immunomodulating therapy 11/20/2021   Environmental allergies 08/12/2020   Esophageal candidiasis (HCC) 06/26/2022   Essential hypertension 06/27/2015   Fibrositis 08/28/2014   Formatting of this note might be different from the original. Fibromyalgia Formatting of this note might be different from the original. Formatting of this note might be different from the original. Fibromyalgia   Flank pain 11/24/2020   Gastroesophageal reflux disease with esophagitis without hemorrhage 08/01/2019   Formatting of this note might be different from the original. Formatting of this note might be different from the original. 07/2019: chronic symptoms of esophageal reflux since prior to sleeve gastrectomy surgery. Symptoms have worsened over the last 6 months with  recent EGD findings of grade B esophagitis, irregular z line, erythematous mucosa, and evidence of sleeve gastrectomy. 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 Formatting of this note might be different from the original. 07/2019: 8 mm colonic polyp removed during colonoscopy, biopsy showed serrated polyp. Repeat colonoscopy recommended in one year. Formatting of this note might be different from the original. Added automatically from request for surgery [phone] Formatting of this note might b   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   History of kidney stones 08/12/2020   Hypercholesterolemia 10/07/2022   Hypertension, essential, benign 06/27/2015   Hypertensive disorder 08/28/2014   Formatting of this note might be different from the original. Hypertension Formatting of this note might be different from the original. Formatting of this note might be different from the original. 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 Keratoconus Formatting of this note might be different from the original. Formatting of this note might be different from the original. Keratoconus Formatting of this note might be different from the original. Formatting of this note might be different from the original. Keratoconus Formatting of this note might be different from the or   Microhematuria 06/27/2015   Migraine 08/28/2014   Formatting of this note might be different from the original. Migraine Formatting of this note might be different from the original. Formatting of this note might be different  from the original. Migraine Formatting of this note might be different from the original. Migraine Formatting of this note might be different from the original. Migraine Formatting of this note might be different from the or   Migraine without aura and without status migrainosus, not intractable 08/28/2014   Formatting of this note might be different from the original. Formatting of this note might be different from the original. Formatting of this note might be different from the original. Migraine Formatting of this note might be different from the original. Migraine Formatting of this note might be different from the original. Formatting of this note might be different from the original. Migraine F  Mild intermittent asthma without complication 10/30/2008   Formatting of this note might be different from the original. Formatting of this note might be different from the original. Formatting of this note might be different from the original. Asthma Formatting of this note might be different from the original. Asthma Formatting of this note might be different from the original. Formatting of this note might be different from the original. 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 Formatting of this note might be different from the original.  01/25/2016 with Dr[facility] Durrell Gilles at [facility]. [place] of this note might be different from the original. 01/25/2016 with Dr. Durrell Gilles at [facility]. [place] of this note might be different from the original. Formatting of this note might be different from    Seronegative inflammatory arthritis 08/28/2014   Formatting of this note might be different from the original. Formatting of this note might be different from the original. Arthritis; diagnosed as IBD related - 2012 Formatting of this note might be different from the original. Followed by rheumatologist. Had to change providers due to an insurance change earlier this year resulting in patient not being able to have Remicade  for ~6 months. 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 Formatting of this note might be different from the original. Treated with PRN miralax . Recent colonoscopy 07/2019. Formatting of this note might be different from the original. Treated with PRN miralax . Recent colonoscopy 07/2019. Formatting of this note might be different from the original. Formatting of this note might be different f   Status post placement of ureteral stent 02/06/2021   Tooth infection 08/12/2020   Unstable angina (HCC) 07/29/2023   Ureteral stone 01/20/2022   Urinary tract infection, site not specified 06/26/2022    Family History  Adopted: Yes  Problem Relation Age of Onset   Diabetes Mother    Hypertension Mother    Diabetes Father    Hypertension Father    Hypertension Sister    Diabetes Sister    Hypertension Brother    Diabetes Brother    Diabetes Paternal Grandmother    Cancer Other    Asthma Neg Hx    Allergic rhinitis Neg Hx    Atopy Neg Hx    Eczema Neg Hx     Past Surgical History:  Procedure Laterality Date   BACK SURGERY     L5-S1   CERVICAL CONE BIOPSY  04/2000   CORNEAL TRANSPLANT Right  03/2006   CORONARY STENT INTERVENTION N/A 07/01/2023   Procedure: CORONARY STENT INTERVENTION;  Surgeon: Wenona Hamilton, MD;  Location: [facility];  Service: Cardiovascular;  Laterality: N/A;   EYE SURGERY     GASTRIC BYPASS  2017   Sleve   LAPAROSCOPIC GASTRIC SLEEVE RESECTION  LEFT HEART CATH AND CORONARY ANGIOGRAPHY N/A 07/01/2023   Procedure: LEFT HEART CATH AND CORONARY ANGIOGRAPHY;  Surgeon: Wenona Hamilton, MD;  Location: [facility];  Service: Cardiovascular;  Laterality: N/A;   LEFT HEART CATH AND CORONARY ANGIOGRAPHY N/A 08/01/2023   Procedure: LEFT HEART CATH AND CORONARY ANGIOGRAPHY;  Surgeon: Knox Perl, MD;  Location: [facility];  Service: Cardiovascular;  Laterality: N/A;   Low back disc surgery     06/2000   TOTAL KNEE ARTHROPLASTY Right 11/2016   Social History   Occupational History   Not on file  Tobacco Use   Smoking status: Never    Passive exposure: Never   Smokeless tobacco: Never  Vaping Use   Vaping status: Never Used  Substance and Sexual Activity   Alcohol use: Yes    Comment: rare   Drug use: Never   Sexual activity: Yes

## 2024-01-07 ENCOUNTER — Other Ambulatory Visit: Payer: Self-pay

## 2024-01-07 ENCOUNTER — Ambulatory Visit
Admission: RE | Admit: 2024-01-07 | Discharge: 2024-01-07 | Disposition: A | Discharge status: Home / Self Care | Source: Ambulatory Visit | Attending: Family

## 2024-01-07 ENCOUNTER — Emergency Department (HOSPITAL_COMMUNITY)
Admission: EM | Admit: 2024-01-07 | Discharge: 2024-01-07 | Disposition: A | Discharge status: Home / Self Care | Attending: Emergency Medicine | Admitting: Emergency Medicine

## 2024-01-07 ENCOUNTER — Encounter (HOSPITAL_COMMUNITY): Payer: Self-pay

## 2024-01-07 DIAGNOSIS — S4992XA Unspecified injury of left shoulder and upper arm, initial encounter: Secondary | ICD-10-CM

## 2024-01-07 DIAGNOSIS — R1115 Cyclical vomiting syndrome unrelated to migraine: Secondary | ICD-10-CM

## 2024-01-07 DIAGNOSIS — E876 Hypokalemia: Secondary | ICD-10-CM | POA: Insufficient documentation

## 2024-01-07 DIAGNOSIS — G43A0 Cyclical vomiting, not intractable: Secondary | ICD-10-CM | POA: Insufficient documentation

## 2024-01-07 DIAGNOSIS — R112 Nausea with vomiting, unspecified: Secondary | ICD-10-CM | POA: Diagnosis present

## 2024-01-07 LAB — URINALYSIS, ROUTINE W REFLEX MICROSCOPIC
Bilirubin Urine: NEGATIVE
Glucose, UA: NEGATIVE mg/dL
Ketones, ur: NEGATIVE mg/dL
Nitrite: NEGATIVE
Protein, ur: NEGATIVE mg/dL
Specific Gravity, Urine: 1.011 (ref 1.005–1.030)
pH: 6 (ref 5.0–8.0)

## 2024-01-07 LAB — COMPREHENSIVE METABOLIC PANEL WITH GFR
ALT: 18 U/L (ref 0–44)
AST: 27 U/L (ref 15–41)
Albumin: 3.5 g/dL (ref 3.5–5.0)
Alkaline Phosphatase: 87 U/L (ref 38–126)
Anion gap: 12 (ref 5–15)
BUN: 9 mg/dL (ref 6–20)
CO2: 24 mmol/L (ref 22–32)
Calcium: 9.6 mg/dL (ref 8.9–10.3)
Chloride: 105 mmol/L (ref 98–111)
Creatinine, Ser: 1.14 mg/dL — ABNORMAL HIGH (ref 0.44–1.00)
GFR, Estimated: 57 mL/min — ABNORMAL LOW (ref >60)
Glucose, Bld: 111 mg/dL — ABNORMAL HIGH (ref 70–99)
Potassium: 3.1 mmol/L — ABNORMAL LOW (ref 3.5–5.1)
Sodium: 141 mmol/L (ref 135–145)
Total Bilirubin: 0.5 mg/dL (ref 0.0–1.2)
Total Protein: 8 g/dL (ref 6.5–8.1)

## 2024-01-07 LAB — CBC
HCT: 34.8 % — ABNORMAL LOW (ref 36.0–46.0)
Hemoglobin: 10.7 g/dL — ABNORMAL LOW (ref 12.0–15.0)
MCH: 24.4 pg — ABNORMAL LOW (ref 26.0–34.0)
MCHC: 30.7 g/dL (ref 30.0–36.0)
MCV: 79.5 fL — ABNORMAL LOW (ref 80.0–100.0)
Platelets: 388 10*3/uL (ref 150–400)
RBC: 4.38 MIL/uL (ref 3.87–5.11)
RDW: 16.8 % — ABNORMAL HIGH (ref 11.5–15.5)
WBC: 10.4 10*3/uL (ref 4.0–10.5)
nRBC: 0 % (ref 0.0–0.2)

## 2024-01-07 LAB — LIPASE, BLOOD: Lipase: 24 U/L (ref 11–51)

## 2024-01-07 LAB — MAGNESIUM: Magnesium: 1.9 mg/dL (ref 1.7–2.4)

## 2024-01-07 MED ORDER — SODIUM CHLORIDE 0.9 % IV BOLUS
1000.0000 mL | Freq: Once | INTRAVENOUS | Status: AC
Start: 2024-01-07 — End: 2024-01-07
  Administered 2024-01-07: 1000 mL via INTRAVENOUS

## 2024-01-07 MED ORDER — SODIUM CHLORIDE 0.9 % IV BOLUS
1000.0000 mL | Freq: Once | INTRAVENOUS | Status: AC
  Administered 2024-01-07: 1000 mL via INTRAVENOUS

## 2024-01-07 MED ORDER — POTASSIUM CHLORIDE 10 MEQ/100ML IV SOLN
10.0000 meq | INTRAVENOUS | Status: AC
  Administered 2024-01-07 (×2): 10 meq via INTRAVENOUS
  Filled 2024-01-07 (×2): qty 100

## 2024-01-07 NOTE — Discharge Instructions (Addendum)
 You are given IV fluids and IV potassium in the ER for hydration and for mildly low potassium levels of 3.2.  I recommend you follow-up with your outpatient doctor for the ongoing vomiting issue.

## 2024-01-07 NOTE — ED Triage Notes (Signed)
 Pt c.o n.v since last night. No abd pain

## 2024-01-07 NOTE — ED Notes (Signed)
Pt provided crackers per request.

## 2024-01-07 NOTE — ED Provider Notes (Signed)
 [facility] [name] EMERGENCY DEPARTMENT AT [facility] Provider Note   CSN: [id number] Arrival date & time: 01/07/24  1324     History  Chief Complaint  Patient presents with   Emesis    Casey [name] is a 53 y.o. female with history of cyclical vomiting, gastric sleeve, presenting to the ED with nausea and vomiting.  Patient reports that she has had many cycles like this in the past.  She says that she will have cycles where she vomits within 15 minutes of eating anything.  Water tends to come down and stay down okay.  She says she has been this way for a few days and came to the ED for IV fluids.  She says IV fluids alone tend to help her.  She does not respond well to any antiemetics and is not in fact nauseated at all right now.  She denies abdominal pain.  She reports her last bowel movement was 2 days ago, she does feel slightly more constipated she is taking oxycodone  for shoulder pain.  She is passing gas however.  I reviewed external records.  The patient has been seen by gastroenterology at [facility] as recently as 2 weeks ago on May 13 in the office for chronic vomiting.  They note that she has a very complex GI history and has been on intermittent medications for both constipation and diarrhea.  She is also on Brilinta  and has had problems with hematemesis in the past.  She had been on Nexium  chronically but her insurance was no longer covering it so she had to be switched to a different medication about a month ago.  She has vomiting episodes at least once a day per her report.  HPI     Home Medications Prior to Admission medications   Medication Sig Start Date End Date Taking? Authorizing Provider  acetaminophen  (TYLENOL ) 500 MG tablet Take 1,000 mg by mouth every 12 (twelve) hours as needed for mild pain or moderate pain.    [provider]  albuterol  (PROVENTIL ) (2.5 MG/3ML) 0.083% nebulizer solution Take 3 mLs (2.5 mg total) by nebulization every 6 (six) hours as  needed for wheezing or shortness of breath. 10/13/23   Everlyn Hockey, PA-C  atorvastatin  (LIPITOR) 80 MG tablet Take 1 tablet (80 mg total) by mouth daily. 09/19/23   Krasowski, Robert J, MD  B Complex-C (B-COMPLEX WITH VITAMIN C) tablet Take 1 tablet by mouth daily. 09/06/19   [provider]  Cholecalciferol  (VITAMIN D3) 50 MCG (2000 UT) TABS Take 1 tablet by mouth daily.    [provider]  EPINEPHrine  (EPIPEN  2-PAK) 0.3 mg/0.3 mL IJ SOAJ injection Inject 0.3 mg into the muscle as needed for anaphylaxis. 01/18/23   Brian Campanile, MD  Evolocumab  (REPATHA  SURECLICK) 140 MG/ML SOAJ Inject 140 mg into the skin every 14 (fourteen) days. 12/27/23   Tolia, Sunit, DO  famotidine  (PEPCID ) 40 MG tablet Take 40 mg by mouth 2 (two) times daily. 12/20/23 12/19/24  [provider]  levocetirizine (XYZAL) 5 MG tablet Take 10 mg by mouth daily.    [provider]  losartan (COZAAR) 100 MG tablet Take 100 mg by mouth daily. 12/08/23 12/07/24  [provider]  lubiprostone (AMITIZA) 8 MCG capsule Take 16 mcg by mouth daily with breakfast. 10/07/23 04/04/24  [provider]  mirabegron  ER (MYRBETRIQ ) 50 MG TB24 tablet Take 1 tablet (50 mg total) by mouth daily. 11/30/22   Adra Alanis, FNP  montelukast  (  SINGULAIR ) 10 MG tablet Take 1 tablet (10 mg total) by mouth at bedtime. Patient taking differently: Take 10 mg by mouth daily. 11/30/22   Adra Alanis, FNP  nitroGLYCERIN  (NITROSTAT ) 0.4 MG SL tablet Place 1 tablet (0.4 mg total) under the tongue every 5 (five) minutes as needed for chest pain. 12/13/23   Tolia, Sunit, DO  oxycodone  (OXY-IR) 5 MG capsule Take 1 capsule (5 mg total) by mouth every 6 (six) hours as needed for pain. 01/03/24   Reuben Castilla, NP  RABEprazole (ACIPHEX) 20 MG tablet Take 20 mg by mouth 2 (two) times daily.    [provider]  ticagrelor  (BRILINTA ) 90 MG TABS tablet Take 1 tablet (90 mg total) by mouth 2 (two)  times daily. 07/01/23   Abbe Abate, MD      Allergies    Albuterol  sulfate, Tape, Formoterol, Hydrocodone , Hydrocodone -acetaminophen , Nickel, Other, Oxycodone -acetaminophen , Salmeterol, Strawberry extract, Advair hfa [fluticasone-salmeterol], Proventil  hfa [albuterol ], Asa [aspirin ], Cleocin [clindamycin], Clindamycin/lincomycin, Fludrocortisone acetate, Gabapentin, Hydrochlorothiazide, Imipramine, Ipratropium bromide, Medroxyprogesterone, Meloxicam, Metformin and related, Nystatin, Penicillins, Pred forte [prednisolone], Prednisone, Solu-medrol  [methylprednisolone ], Sulfa antibiotics, Tegaderm ag mesh [silver], Toradol  [ketorolac  tromethamine ], and Tramadol    Review of Systems   Review of Systems  Physical Exam Updated Vital Signs BP (!) 134/97 (BP Location: Right Arm)   Pulse 84   Temp 98.7 F (37.1 C) (Oral)   Resp 17   SpO2 96%  Physical Exam Constitutional:      General: She is not in acute distress.    Appearance: She is obese.  HENT:     Head: Normocephalic and atraumatic.  Eyes:     Conjunctiva/sclera: Conjunctivae normal.     Pupils: Pupils are equal, round, and reactive to light.  Cardiovascular:     Rate and Rhythm: Normal rate and regular rhythm.  Pulmonary:     Effort: Pulmonary effort is normal. No respiratory distress.  Abdominal:     General: There is no distension.     Tenderness: There is no abdominal tenderness.  Skin:    General: Skin is warm and dry.  Neurological:     General: No focal deficit present.     Mental Status: She is alert. Mental status is at baseline.  Psychiatric:        Mood and Affect: Mood normal.        Behavior: Behavior normal.     ED Results / Procedures / Treatments   Labs (all labs ordered are listed, but only abnormal results are displayed) Labs Reviewed  COMPREHENSIVE METABOLIC PANEL WITH GFR - Abnormal; Notable for the following components:      Result Value   Potassium 3.1 (*)    Glucose, Bld 111 (*)     Creatinine, Ser 1.14 (*)    GFR, Estimated 57 (*)    All other components within normal limits  CBC - Abnormal; Notable for the following components:   Hemoglobin 10.7 (*)    HCT 34.8 (*)    MCV 79.5 (*)    MCH 24.4 (*)    RDW 16.8 (*)    All other components within normal limits  URINALYSIS, ROUTINE W REFLEX MICROSCOPIC - Abnormal; Notable for the following components:   Hgb urine dipstick MODERATE (*)    Leukocytes,Ua TRACE (*)    Bacteria, UA RARE (*)    All other components within normal limits  LIPASE, BLOOD  MAGNESIUM     EKG None  Radiology No results found.  Procedures Procedures  Medications Ordered in ED Medications  sodium chloride  0.9 % bolus 1,000 mL (0 mLs Intravenous Stopped 01/07/24 1616)  potassium chloride  10 mEq in 100 mL IVPB (0 mEq Intravenous Stopped 01/07/24 1426)  sodium chloride  0.9 % bolus 1,000 mL (1,000 mLs Intravenous New Bag/Given 01/07/24 1616)    ED Course/ Medical Decision Making/ A&P                                 Medical Decision Making Amount and/or Complexity of Data Reviewed Labs: ordered.  Risk Prescription drug management.   This patient presents to the ED with concern for vomiting. This involves an extensive number of treatment options, and is a complaint that carries with it a high risk of complications and morbidity.  The differential diagnosis includes cyclical vomiting most likely versus gastritis versus other  Co-morbidities that complicate the patient evaluation: Gastric sleeve surgery  External records from outside source obtained and reviewed including outpatient records as noted above  I ordered and personally interpreted labs.  The pertinent results include: Mild hypokalemia with potassium 3.1, no other emergent findings  I ordered medication including IV fluids for hydration and IV potassium for hypokalemia  I have reviewed the patients home medicines and have made adjustments as needed  Test Considered:  Patient has a longstanding history of cyclical vomiting and do not see an indication for an emergent CT scan of the abdomen.  I have a fairly low suspicion at this time for bowel obstruction, biliary disease.  The patient did express some frustrations to me about her nurse placing an IV in her left hand, claiming this IV was very irritating during potassium infusion.  We had halted the infusion at that time and adjusted the IV to a new site in her left forearm, which she tolerated better. I don't see evidence of any significant IV infiltration in her arm.  I repeatedly asked and offer additional medications for her symptoms, but she wanted nothing beyond IV fluids, reporting to me that she was not nauseated, only needing hydration. So we will continue with fluids.  After the interventions noted above, I reevaluated the patient and found that they have: improved -appears to be tolerating ginger ale or p.o. at the bedside.  Patient is requesting some additional fluids, and likely will be discharged afterwards.  At this time my suspicion for intra-abdominal surgical or infectious emergency is quite low, I suspect this is her chronic GI upset and vomiting, for which she can follow-up with her outpatient specialist or doctor.   Disposition:  After consideration of the diagnostic results and the patients response to treatment, I feel that the patent would benefit from close outpatient follow-up        Final Clinical Impression(s) / ED Diagnoses Final diagnoses:  Hypokalemia  Cyclical vomiting    Rx / DC Orders ED Discharge Orders     None         Micheline Markes, Janalyn Me, MD 01/07/24 1901

## 2024-01-07 NOTE — ED Notes (Addendum)
 Pt continues to have multiple complaints related to care in the Emergency department. This RN attempted to provide support, pt not open to communication with this RN. Pt rolling eyes and shaking head at this nurse. "There is nothing you can do" Attending provider aware.

## 2024-01-10 ENCOUNTER — Ambulatory Visit (INDEPENDENT_AMBULATORY_CARE_PROVIDER_SITE_OTHER): Admitting: *Deleted

## 2024-01-10 DIAGNOSIS — J309 Allergic rhinitis, unspecified: Secondary | ICD-10-CM

## 2024-01-11 ENCOUNTER — Telehealth: Payer: Self-pay | Admitting: Family

## 2024-01-11 DIAGNOSIS — M25512 Pain in left shoulder: Secondary | ICD-10-CM

## 2024-01-11 DIAGNOSIS — M75112 Incomplete rotator cuff tear or rupture of left shoulder, not specified as traumatic: Secondary | ICD-10-CM

## 2024-01-11 MED ORDER — OXYCODONE HCL 5 MG PO CAPS: 5.0000 mg | ORAL_CAPSULE | Freq: Three times a day (TID) | ORAL | 0 refills | Status: DC | PRN

## 2024-01-11 NOTE — Telephone Encounter (Signed)
 Patient called and ask if she should be going to PT because her arm is still in pain and can't move it  that much. She stated she left a message in mychart yesterday. CB#[phone]

## 2024-01-11 NOTE — Telephone Encounter (Signed)
 [facility] in [place] called and said that they couldn't find Erin in the system and also they don't do capsules in Oxycodone . CB#[phone]

## 2024-01-11 NOTE — Addendum Note (Signed)
 Addended by: Henley Boettner R on: 01/11/2024 03:15 PM   Modules accepted: Orders

## 2024-01-11 NOTE — Telephone Encounter (Signed)
Can she do PT?

## 2024-01-12 ENCOUNTER — Telehealth: Payer: Self-pay | Admitting: Family

## 2024-01-12 ENCOUNTER — Other Ambulatory Visit

## 2024-01-12 ENCOUNTER — Other Ambulatory Visit: Payer: Self-pay | Admitting: Orthopedic Surgery

## 2024-01-12 ENCOUNTER — Telehealth: Payer: Self-pay | Admitting: Pharmacy Technician

## 2024-01-12 ENCOUNTER — Other Ambulatory Visit: Payer: Self-pay

## 2024-01-12 ENCOUNTER — Other Ambulatory Visit (HOSPITAL_COMMUNITY): Payer: Self-pay

## 2024-01-12 MED ORDER — TICAGRELOR 90 MG PO TABS: 90.0000 mg | ORAL_TABLET | Freq: Two times a day (BID) | ORAL | 3 refills | Status: AC

## 2024-01-12 MED ORDER — OXYCODONE-ACETAMINOPHEN 5-325 MG PO TABS: 1.0000 | ORAL_TABLET | Freq: Three times a day (TID) | ORAL | 0 refills | Status: DC | PRN

## 2024-01-12 NOTE — Telephone Encounter (Signed)
   Insurance will pay for the generic for 0.00 copay per test claim. I called [facility] and they ran the generic no problem for 0.00 -tomorrow pickup. Sent pt message

## 2024-01-12 NOTE — Telephone Encounter (Signed)
 [facility] pharmacy called regarding Oxycodone  prescription sent in for patient. Pharmacy cannot get capsules, and is asking if prescription can be changed to tablets. [phone]

## 2024-01-12 NOTE — Telephone Encounter (Signed)
 Erin wrote rx yesterday for Oxycodone . Pt states written for capsules and needs it changed to tablets for insurance. Can you please submit.

## 2024-01-16 ENCOUNTER — Ambulatory Visit (INDEPENDENT_AMBULATORY_CARE_PROVIDER_SITE_OTHER): Admitting: *Deleted

## 2024-01-16 ENCOUNTER — Other Ambulatory Visit (HOSPITAL_BASED_OUTPATIENT_CLINIC_OR_DEPARTMENT_OTHER): Payer: Self-pay | Admitting: Student

## 2024-01-16 ENCOUNTER — Encounter (HOSPITAL_BASED_OUTPATIENT_CLINIC_OR_DEPARTMENT_OTHER): Payer: Self-pay | Admitting: Student

## 2024-01-16 DIAGNOSIS — E876 Hypokalemia: Secondary | ICD-10-CM

## 2024-01-16 DIAGNOSIS — J309 Allergic rhinitis, unspecified: Secondary | ICD-10-CM | POA: Diagnosis not present

## 2024-01-16 MED ORDER — OXYCODONE HCL 5 MG PO CAPS: 5.0000 mg | ORAL_CAPSULE | Freq: Four times a day (QID) | ORAL | 0 refills | Status: DC | PRN

## 2024-01-16 NOTE — Addendum Note (Signed)
 Addended by: Emerly Prak R on: 01/16/2024 11:45 AM   Modules accepted: Orders

## 2024-01-18 ENCOUNTER — Ambulatory Visit (INDEPENDENT_AMBULATORY_CARE_PROVIDER_SITE_OTHER): Payer: Self-pay

## 2024-01-18 ENCOUNTER — Encounter: Payer: Self-pay | Admitting: Cardiology

## 2024-01-18 DIAGNOSIS — J309 Allergic rhinitis, unspecified: Secondary | ICD-10-CM | POA: Diagnosis not present

## 2024-01-19 MED ORDER — ATORVASTATIN CALCIUM 40 MG PO TABS: 80.0000 mg | ORAL_TABLET | Freq: Every day | ORAL | 3 refills | Status: AC

## 2024-01-23 ENCOUNTER — Ambulatory Visit (INDEPENDENT_AMBULATORY_CARE_PROVIDER_SITE_OTHER): Admitting: *Deleted

## 2024-01-23 DIAGNOSIS — J309 Allergic rhinitis, unspecified: Secondary | ICD-10-CM

## 2024-01-24 ENCOUNTER — Encounter: Payer: Self-pay | Admitting: Family

## 2024-01-24 ENCOUNTER — Ambulatory Visit (INDEPENDENT_AMBULATORY_CARE_PROVIDER_SITE_OTHER): Admitting: Family

## 2024-01-24 DIAGNOSIS — M67814 Other specified disorders of tendon, left shoulder: Secondary | ICD-10-CM

## 2024-01-24 MED ORDER — LIDOCAINE HCL 1 % IJ SOLN
5.0000 mL | INTRAMUSCULAR | Status: AC | PRN
Start: 2024-01-24 — End: 2024-01-24
  Administered 2024-01-24: 5 mL

## 2024-01-24 MED ORDER — METHYLPREDNISOLONE ACETATE 40 MG/ML IJ SUSP
40.0000 mg | INTRAMUSCULAR | Status: AC | PRN
Start: 2024-01-24 — End: 2024-01-24
  Administered 2024-01-24: 40 mg via INTRA_ARTICULAR

## 2024-01-24 NOTE — Progress Notes (Signed)
 Office Visit Note   Patient: Casey [name]           Date of Birth: November 01, 1970           MRN: [id number] Visit Date: 01/24/2024              Requested by: Cherylene Corrente, PA-C [address] [place],  [place] [address] PCP: Cherylene Corrente, [place]  Chief Complaint  Patient presents with   Left Shoulder - Pain      HPI: The patient is a 53 year old woman seen today for evaluation of left shoulder as well as an MRI review  She is to start physical therapy next week.  She is requesting a Depo-Medrol  injection today.  She would like accommodation for her return to work.  She feels she could complete the duties with medications  Assessment & Plan: Visit Diagnoses: No diagnosis found.  Plan: Work note provided.  The patient is to follow-up in the office in 4 weeks she will continue with physical therapy Depo-Medrol  injection today.  Patient tolerated well.  Follow-Up Instructions: No follow-ups on file.   Left Shoulder Exam   Tests  Impingement: positive Drop arm: negative  Other  Pulse: present   Comments:  Exam essentially unchanged      Patient is alert, oriented, no adenopathy, well-dressed, normal affect, normal respiratory effort.     Imaging: No results found. No images are attached to the encounter.  Labs: Lab Results  Component Value Date   HGBA1C 6.0 (H) 09/06/2023   HGBA1C 5.8 11/30/2022   ESRSEDRATE 46 (H) 10/24/2023   ESRSEDRATE 40 (H) 07/31/2023   CRP 0.8 07/31/2023   REPTSTATUS 11/16/2023 FINAL 11/10/2023   REPTSTATUS 11/16/2023 FINAL 11/10/2023   CULT  11/10/2023    NO GROWTH 5 DAYS Performed at [facility], [address], [place], [place] [address]    CULT  11/10/2023    NO GROWTH 5 DAYS Performed at [facility], [address] [place], [place] [address]    LABORGA ESCHERICHIA COLI (A) 07/30/2023     Lab Results  Component Value Date   ALBUMIN 3.5 01/07/2024   ALBUMIN 4.2 11/30/2023   ALBUMIN 4.3 11/19/2023     Lab Results  Component Value Date   MG 1.9 01/07/2024   MG 2.0 11/30/2023   MG 1.8 09/09/2023   No results found for: VD25OH  No results found for: PREALBUMIN    Latest Ref Rng & Units 01/07/2024   10:04 AM 11/30/2023    5:59 PM 11/19/2023    9:27 PM  CBC EXTENDED  WBC 4.0 - 10.5 K/uL 10.4  12.0  16.2   RBC 3.87 - 5.11 MIL/uL 4.38  4.30  4.67   Hemoglobin 12.0 - 15.0 g/dL 21.3  08.6  57.8   HCT 36.0 - 46.0 % 34.8  33.9  37.0   Platelets 150 - 400 K/uL 388  379  422      There is no height or weight on file to calculate BMI.  Orders:  No orders of the defined types were placed in this encounter.  No orders of the defined types were placed in this encounter.    Procedures: Large Joint Inj: L subacromial bursa on 01/24/2024 11:28 AM Indications: pain Details: 22 G 1.5 in needle Medications: 5 mL lidocaine  1 %; 40 mg methylPREDNISolone  acetate 40 MG/ML Consent was given by the patient.      Clinical Data: No additional findings.  ROS:  All other systems negative, except as noted in the HPI. Review of Systems  Objective: Vital Signs: There were no vitals taken for this visit.  Specialty Comments:  No specialty comments available.  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   Coronary artery disease s/p PCI/DES to LAD 07/01/2023 12/28/2022   Precordial chest pain 10/13/2022   Hyperlipidemia 10/07/2022   Hiatal  hernia 10/07/2022   Esophageal candidiasis (HCC) 06/26/2022   Urinary tract infection, site not specified 06/26/2022   Ureteral stone 01/20/2022   Encounter for monitoring immunomodulating therapy 11/20/2021   Rheumatoid arthritis of multiple sites with 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 factor (HCC) 11/16/2021   Pyelonephritis 09/25/2021   De Quervain's tenosynovitis, left 07/31/2021   Contusion of left wrist 07/08/2021   Status post placement of ureteral stent 02/06/2021   Proteinuria 08/12/2020   Environmental allergies 08/12/2020   History of kidney stones 08/12/2020   Tooth infection 08/12/2020   Gastroesophageal reflux disease with esophagitis without hemorrhage 08/01/2019   Slow transit constipation 08/01/2019   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   History of colon polyps 06/19/2019   Irregular astigmatism of both eyes 10/11/2018   Nephrolithiasis 09/08/2018   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   Plantar wart 11/04/2016   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   Myopia with astigmatism and presbyopia 05/07/2016   Overactive bladder 11/18/2015   Incontinence 10/07/2015   Albuminuria 06/27/2015   Essential hypertension 06/27/2015   Microhematuria 06/27/2015   Hematuria 06/18/2015   Cataract 05/06/2015   Dry eyes 05/06/2015   Crohn's disease (HCC) 04/21/2015   Osteoarthritis of both knees 04/21/2015   Right knee pain 04/21/2015   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/30/2014   History of abnormal cervical Pap smear 11/29/2014   Fibrositis 08/28/2014   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   Migraine without aura and without status migrainosus, not intractable 08/28/2014   Migraine 08/28/2014   Seronegative inflammatory arthritis 08/28/2014   Increased urinary frequency 07/16/2013   Dysfunction of both eustachian tubes 08/31/2012   Asthma 10/30/2008   Mild intermittent asthma without complication 10/30/2008   Past Medical History:  Diagnosis Date    Albuminuria 06/27/2015   Anxiety 11/04/2014   Arm DVT (deep venous thromboembolism), acute, left (HCC) 08/17/2022   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/30/2014   Asthma    Atopic dermatitis 10/30/2008   Formatting of this note might be different from the original. Atopic dermatitis Formatting of this note might be different from the original. Formatting of this note might be different from the original. Atopic dermatitis Formatting of this note might be different from the original. Formatting of this note might be different from the original. Formatting of this note might be different from the or   Cataract 05/06/2015   Contusion of left wrist 07/08/2021   Coronary artery disease s/p PCI/DES to LAD 07/01/2023 12/28/2022   Crohn's disease (HCC) 04/21/2015   De Quervain's tenosynovitis, left 07/31/2021   Diabetes mellitus without complication (HCC)  Diabetes type 2, controlled (HCC) 04/21/2015   Dry eyes 05/06/2015   DVT (deep venous thrombosis) (HCC)    Dysfunction of both eustachian tubes 08/31/2012   Encounter for monitoring immunomodulating therapy 11/20/2021   Environmental allergies 08/12/2020   Esophageal candidiasis (HCC) 06/26/2022   Essential hypertension 06/27/2015   Fibrositis 08/28/2014   Formatting of this note might be different from the original. Fibromyalgia Formatting of this note might be different from the original. Formatting of this note might be different from the original. Fibromyalgia   Flank pain 11/24/2020   Gastroesophageal reflux disease with esophagitis without hemorrhage 08/01/2019   Formatting of this note might be different from the original. Formatting of this note might be different from the original. 07/2019: chronic symptoms of esophageal reflux since prior to sleeve gastrectomy surgery. Symptoms have worsened over the last 6 months with recent EGD findings of grade B esophagitis, irregular z line, erythematous mucosa, and evidence  of sleeve gastrectomy. 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 Formatting of this note might be different from the original. 07/2019: 8 mm colonic polyp removed during colonoscopy, biopsy showed serrated polyp. Repeat colonoscopy recommended in one year. Formatting of this note might be different from the original. Added automatically from request for surgery [id number] Formatting of this note might b   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   History of kidney stones 08/12/2020   Hypercholesterolemia 10/07/2022   Hypertension, essential, benign 06/27/2015   Hypertensive disorder 08/28/2014   Formatting of this note might be different from the original. Hypertension Formatting of this note might be different from the original. Formatting of this note might be different from the original. 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 Keratoconus Formatting of this note might be different from the original. Formatting of this note might be different from the original. Keratoconus Formatting of this note might be different from the original. Formatting of this note might be different from the original. Keratoconus Formatting of this note might be different from the or   Microhematuria 06/27/2015   Migraine 08/28/2014   Formatting of this note might be different from the original. Migraine Formatting of this note might be different from the original. Formatting of this note might be different from the original. Migraine Formatting of this note might be different from the original.  Migraine Formatting of this note might be different from the original. Migraine Formatting of this note might be different from the or   Migraine without aura and without status migrainosus, not intractable 08/28/2014   Formatting of this note might be different from the original. Formatting of this note might be different from the original. Formatting of this note might be different from the original. Migraine Formatting of this note might be different from the original. Migraine Formatting of this note might be different from the original. Formatting of this note might be different from the original. Migraine F   Mild intermittent asthma without complication 10/30/2008   Formatting of this note might be different from the original. Formatting of this note might be different from the  original. Formatting of this note might be different from the original. Asthma Formatting of this note might be different from the original. Asthma Formatting of this note might be different from the original. Formatting of this note might be different from the original. 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 Formatting of this note might be different from the original. 01/25/2016 with Dr. Durrell Gilles at [facility]. [place] of this note might be  different from the original. 01/25/2016 with Dr[facility] Durrell Gilles at [facility]. [place] of this note might be different from the original. Formatting of this note might be different from    Seronegative inflammatory arthritis 08/28/2014   Formatting of this note might be different from the original. Formatting of this note might be different from the original. Arthritis; diagnosed as IBD related - 2012 Formatting of this note might be different from the original. Followed by rheumatologist. Had to change providers due to an insurance change earlier this year resulting in patient not being able to have Remicade  for ~6 months. 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 Formatting of this note might be different from the original. Treated with PRN miralax . Recent colonoscopy 07/2019. Formatting of this note might be different from the original. Treated with PRN miralax . Recent colonoscopy 07/2019. Formatting of this note might be different from the original. Formatting of this note might be different f   Status post placement of ureteral stent 02/06/2021   Tooth infection 08/12/2020   Unstable angina (HCC) 07/29/2023   Ureteral stone 01/20/2022   Urinary tract infection, site not specified 06/26/2022    Family History  Adopted: Yes  Problem Relation Age of Onset   Diabetes Mother    Hypertension Mother    Diabetes Father    Hypertension Father    Hypertension Sister    Diabetes Sister    Hypertension Brother    Diabetes Brother    Diabetes Paternal Grandmother    Cancer Other    Asthma Neg Hx    Allergic rhinitis Neg Hx    Atopy Neg Hx    Eczema Neg Hx     Past Surgical History:  Procedure Laterality Date   BACK SURGERY     L5-S1   CERVICAL CONE BIOPSY  04/2000   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   CORONARY STENT INTERVENTION N/A 07/01/2023   Procedure: CORONARY STENT  INTERVENTION;  Surgeon: Wenona Hamilton, MD;  Location: [facility];  Service: Cardiovascular;  Laterality: N/A;   EYE SURGERY     GASTRIC BYPASS  2017   Sleve   LAPAROSCOPIC GASTRIC SLEEVE RESECTION     LEFT HEART CATH AND CORONARY ANGIOGRAPHY N/A 07/01/2023   Procedure: LEFT HEART CATH AND CORONARY ANGIOGRAPHY;  Surgeon: Wenona Hamilton, MD;  Location:  [facility];  Service: Cardiovascular;  Laterality: N/A;   LEFT HEART CATH AND CORONARY ANGIOGRAPHY N/A 08/01/2023   Procedure: LEFT HEART CATH AND CORONARY ANGIOGRAPHY;  Surgeon: Knox Perl, MD;  Location: [facility];  Service: Cardiovascular;  Laterality: N/A;   Low back disc surgery     06/2000   TOTAL KNEE ARTHROPLASTY Right 11/2016   Social History   Occupational History   Not on file  Tobacco Use   Smoking status: Never    Passive exposure: Never   Smokeless tobacco: Never  Vaping Use   Vaping status: Never Used  Substance and Sexual Activity   Alcohol use: Yes    Comment: rare   Drug use: Never   Sexual activity: Yes

## 2024-01-25 ENCOUNTER — Ambulatory Visit (INDEPENDENT_AMBULATORY_CARE_PROVIDER_SITE_OTHER)

## 2024-01-25 DIAGNOSIS — J309 Allergic rhinitis, unspecified: Secondary | ICD-10-CM | POA: Diagnosis not present

## 2024-01-28 ENCOUNTER — Ambulatory Visit (HOSPITAL_BASED_OUTPATIENT_CLINIC_OR_DEPARTMENT_OTHER)
Admission: RE | Admit: 2024-01-28 | Discharge: 2024-01-28 | Disposition: A | Discharge status: Home / Self Care | Source: Ambulatory Visit | Attending: Student

## 2024-01-28 ENCOUNTER — Encounter (HOSPITAL_BASED_OUTPATIENT_CLINIC_OR_DEPARTMENT_OTHER): Payer: Self-pay

## 2024-01-28 VITALS — BP 124/82 | HR 76 | Temp 98.6°F | Resp 20

## 2024-01-28 DIAGNOSIS — B354 Tinea corporis: Secondary | ICD-10-CM

## 2024-01-28 NOTE — Discharge Instructions (Addendum)
 This is ringworm.  Recommend applying clotrimazole over-the-counter cream as prescribed. Also recommend potentially treating your dog in case they are the source.  There are special medicated shampoos you can buy to treat this.  Follow-up as needed

## 2024-01-28 NOTE — ED Provider Notes (Signed)
 [name]    CSN: [id number] Arrival date & time: 01/28/24  1320      History   Chief Complaint Chief Complaint  Patient presents with   Rash    I have multiple spots that look like ring worm but I haven't been near a cat - Entered by patient    HPI Casey [name] is a 53 y.o. female.   53 year old female presents today with rash.  The rash is located in multiple places to include 1 on bilateral arm, stomach and 1 on the back of her thigh which has dried up.  The rash is itchy.  She noticed this over the past few days.  Has not done anything to treat the rash.  No fevers, chills.  Does have animals.  Unsure if they have any rashes or problems.   Rash   Past Medical History:  Diagnosis Date   Albuminuria 06/27/2015   Anxiety 11/04/2014   Arm DVT (deep venous thromboembolism), acute, left (HCC) 08/17/2022   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/30/2014   Asthma    Atopic dermatitis 10/30/2008   Formatting of this note might be different from the original. Atopic dermatitis Formatting of this note might be different from the original. Formatting of this note might be different from the original. Atopic dermatitis Formatting of this note might be different from the original. Formatting of this note might be different from the original. Formatting of this note might be different from the or   Cataract 05/06/2015   Contusion of left wrist 07/08/2021   Coronary artery disease s/p PCI/DES to LAD 07/01/2023 12/28/2022   Crohn's disease (HCC) 04/21/2015   De Quervain's tenosynovitis, left 07/31/2021   Diabetes mellitus without complication (HCC)    Diabetes type 2, controlled (HCC) 04/21/2015   Dry eyes 05/06/2015   DVT (deep venous thrombosis) (HCC)    Dysfunction of both eustachian tubes 08/31/2012   Encounter for monitoring immunomodulating therapy 11/20/2021   Environmental allergies 08/12/2020   Esophageal candidiasis (HCC) 06/26/2022   Essential  hypertension 06/27/2015   Fibrositis 08/28/2014   Formatting of this note might be different from the original. Fibromyalgia Formatting of this note might be different from the original. Formatting of this note might be different from the original. Fibromyalgia   Flank pain 11/24/2020   Gastroesophageal reflux disease with esophagitis without hemorrhage 08/01/2019   Formatting of this note might be different from the original. Formatting of this note might be different from the original. 07/2019: chronic symptoms of esophageal reflux since prior to sleeve gastrectomy surgery. Symptoms have worsened over the last 6 months with recent EGD findings of grade B esophagitis, irregular z line, erythematous mucosa, and evidence of sleeve gastrectomy. 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 Formatting of this note might be different from the original. 07/2019: 8 mm colonic polyp removed during colonoscopy, biopsy showed serrated polyp. Repeat colonoscopy recommended in one year. Formatting of this note might be different from the original. Added automatically from request for surgery [id number] Formatting of this note might b   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   History of kidney stones 08/12/2020   Hypercholesterolemia 10/07/2022   Hypertension, essential, benign 06/27/2015   Hypertensive disorder 08/28/2014  Formatting of this note might be different from the original. Hypertension Formatting of this note might be different from the original. Formatting of this note might be different from the original. 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 Keratoconus Formatting of this note might be different from the original. Formatting of this note might be different from the original. Keratoconus Formatting of this note might be different from the original. Formatting of this note might be different from the original. Keratoconus Formatting of this note might be different from the or   Microhematuria 06/27/2015   Migraine 08/28/2014   Formatting of this note might be different from the original. Migraine Formatting of this note might be different from the original. Formatting of this note might be different from the original. Migraine Formatting of this note might be different from the original. Migraine Formatting of this note might be different from the original. Migraine Formatting of this note might be different from the or   Migraine without aura and without status migrainosus, not intractable 08/28/2014   Formatting of this note might be different from the original. Formatting of this note might be different from the original. Formatting of this note might be different from the original. Migraine Formatting of this note might be different from the original. Migraine Formatting of this note might be different from the original. Formatting of this note might be different from the original. Migraine F   Mild intermittent asthma without complication 10/30/2008   Formatting of this note might be different from the original. Formatting of this note might be different from the original. Formatting of this note might be different from the original. Asthma Formatting of this note might be different from the original. Asthma Formatting of this note might be different from the original. Formatting of this note might be different from the original. 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 Formatting of this note might be different from the original. 01/25/2016 with Dr. Loney Pol at [facility]. [place] of this note might be different from the original. 01/25/2016 with Dr. Loney Pol at [facility]. [place] of this note might be different from the original. Formatting of this note might be different from    Seronegative inflammatory arthritis 08/28/2014   Formatting of this note might be different from the original. Formatting of this note might be different from the original. Arthritis; diagnosed as IBD related - 2012 Formatting of this note might be different from the original. Followed by rheumatologist. Had to change providers due to an insurance change earlier this year resulting in patient not being able to have Remicade  for ~6 months. Restar   Sero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 arthritis (HCC)    Slow transit constipation 08/01/2019  Formatting of this note might be different from the original. Formatting of this note might be different from the original. Treated with PRN miralax . Recent colonoscopy 07/2019. Formatting of this note might be different from the original. Treated with PRN miralax . Recent colonoscopy 07/2019. Formatting of this note might be different from the original. Formatting of this note might be different f   Status post placement of ureteral stent 02/06/2021   Tooth infection 08/12/2020    Unstable angina (HCC) 07/29/2023   Ureteral stone 01/20/2022   Urinary tract infection, site not specified 06/26/2022    Patient Active Problem List   Diagnosis Date Noted   Pain in left shoulder 01/05/2024   Other chronic pain 12/14/2023   Concussion with loss of consciousness 10/11/2023   Hypokalemia 09/21/2023   Cellulitis 09/16/2023   Bilateral lower extremity edema 09/16/2023   Unstable angina (HCC) 07/29/2023   Moderate persistent asthma 07/29/2023   History of DVT (deep vein thrombosis) 06/30/2023   Chest pain, rule out acute myocardial infarction 06/29/2023   Nocturnal hypoxemia 06/13/2023   Oropharyngeal candidiasis 01/02/2023   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   Coronary artery disease s/p PCI/DES to LAD 07/01/2023 12/28/2022   Precordial chest pain 10/13/2022   Hyperlipidemia 10/07/2022   Hiatal hernia 10/07/2022   Esophageal candidiasis (HCC) 06/26/2022   Urinary tract infection, site not specified 06/26/2022   Ureteral stone 01/20/2022   Encounter for monitoring immunomodulating therapy 11/20/2021   Rheumatoid arthritis of multiple sites with 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 factor (HCC) 11/16/2021   Pyelonephritis 09/25/2021   De Quervain's tenosynovitis, left 07/31/2021   Contusion of left wrist 07/08/2021   Status post placement of ureteral stent 02/06/2021   Proteinuria 08/12/2020   Environmental allergies 08/12/2020   History of kidney stones 08/12/2020   Tooth infection 08/12/2020   Gastroesophageal reflux disease with esophagitis without hemorrhage 08/01/2019   Slow transit constipation 08/01/2019   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   History of colon polyps 06/19/2019   Irregular astigmatism of both eyes 10/11/2018   Nephrolithiasis 09/08/2018   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   Plantar wart 11/04/2016   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   Myopia with astigmatism and presbyopia 05/07/2016   Overactive bladder  11/18/2015   Incontinence 10/07/2015   Albuminuria 06/27/2015   Essential hypertension 06/27/2015   Microhematuria 06/27/2015   Hematuria 06/18/2015   Cataract 05/06/2015   Dry eyes 05/06/2015   Crohn's disease (HCC) 04/21/2015   Osteoarthritis of both knees 04/21/2015   Right knee pain 04/21/2015   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/30/2014   History of abnormal cervical Pap smear 11/29/2014   Fibrositis 08/28/2014   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   Migraine without aura and without status migrainosus, not intractable 08/28/2014   Migraine 08/28/2014   Seronegative inflammatory arthritis 08/28/2014   Increased urinary frequency 07/16/2013   Dysfunction of both eustachian tubes 08/31/2012   Asthma 10/30/2008   Mild intermittent asthma without complication 10/30/2008    Past Surgical History:  Procedure Laterality Date   BACK SURGERY     L5-S1   CERVICAL CONE BIOPSY  04/2000   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   CORONARY STENT INTERVENTION N/A 07/01/2023   Procedure: CORONARY STENT INTERVENTION;  Surgeon: Darron Deatrice LABOR, MD;  Location: [facility];  Service: Cardiovascular;  Laterality: N/A;   EYE SURGERY     GASTRIC BYPASS  2017   Sleve   LAPAROSCOPIC GASTRIC SLEEVE RESECTION     LEFT HEART CATH AND  CORONARY ANGIOGRAPHY N/A 07/01/2023   Procedure: LEFT HEART CATH AND CORONARY ANGIOGRAPHY;  Surgeon: Darron Deatrice LABOR, MD;  Location: [facility];  Service: Cardiovascular;  Laterality: N/A;   LEFT HEART CATH AND CORONARY ANGIOGRAPHY N/A 08/01/2023   Procedure: LEFT HEART CATH AND CORONARY ANGIOGRAPHY;  Surgeon: Ladona Heinz, MD;  Location: [facility];  Service: Cardiovascular;  Laterality: N/A;   Low back disc surgery     06/2000   TOTAL KNEE ARTHROPLASTY Right 11/2016    OB History     Gravida  0   Para  0   Term  0   Preterm  0   AB  0   Living  0      SAB  0   IAB  0   Ectopic  0   Multiple  0   Live Births  0             Home Medications    Prior to Admission medications   Medication Sig Start Date End Date Taking? Authorizing Provider  acetaminophen  (TYLENOL ) 500 MG tablet Take 1,000 mg by mouth every 12 (twelve) hours as needed for mild pain or moderate pain.    [provider]  albuterol  (PROVENTIL ) (2.5 MG/3ML) 0.083% nebulizer solution Take 3 mLs (2.5 mg total) by nebulization every 6 (six) hours as needed for wheezing or shortness of breath. 10/13/23   Alva Larraine FALCON, PA-C  atorvastatin  (LIPITOR) 40 MG tablet Take 2 tablets (80 mg total) by mouth daily. 01/19/24   Tolia, Sunit, DO  B Complex-C (B-COMPLEX WITH VITAMIN C) tablet Take 1 tablet by mouth daily. 09/06/19   [provider]  Cholecalciferol  (VITAMIN D3) 50 MCG (2000 UT) TABS Take 1 tablet by mouth daily.    [provider]  EPINEPHrine  (EPIPEN  2-PAK) 0.3 mg/0.3 mL IJ SOAJ injection Inject 0.3 mg into the muscle as needed for anaphylaxis. 01/18/23   Jeneal Danita Macintosh, MD  Evolocumab  (REPATHA  SURECLICK) 140 MG/ML SOAJ Inject 140 mg into the skin every 14 (fourteen) days. 12/27/23   Tolia, Sunit, DO  famotidine  (PEPCID ) 40 MG tablet Take 40 mg by mouth 2 (two) times daily. 12/20/23 12/19/24  [provider]  levocetirizine (XYZAL) 5 MG tablet Take 10 mg by mouth daily.    [provider]  losartan (COZAAR) 100 MG tablet Take 100 mg by mouth daily. 12/08/23 12/07/24  [provider]  lubiprostone (AMITIZA) 8 MCG capsule Take 16 mcg by mouth daily with breakfast. 10/07/23 04/04/24  [provider]  mirabegron  ER (MYRBETRIQ ) 50 MG TB24 tablet Take 1 tablet (50 mg total) by mouth daily. 11/30/22   Jason Leita Repine, FNP  montelukast  (SINGULAIR ) 10 MG tablet Take 1 tablet (10 mg total) by mouth at bedtime. Patient taking differently: Take 10 mg by mouth daily. 11/30/22   Jason Leita Repine, FNP  nitroGLYCERIN  (NITROSTAT ) 0.4 MG SL tablet Place 1 tablet (0.4 mg total) under the  tongue every 5 (five) minutes as needed for chest pain. 12/13/23   Tolia, Sunit, DO  oxycodone  (OXY-IR) 5 MG capsule Take 1 capsule (5 mg total) by mouth every 8 (eight) hours as needed for pain. 01/11/24   Valdemar Rocky SAUNDERS, NP  oxycodone  (OXY-IR) 5 MG capsule Take 1 capsule (5 mg total) by mouth every 6 (six) hours as needed. 01/16/24   Zamora, Erin R, NP  oxyCODONE -acetaminophen  (PERCOCET/ROXICET) 5-325 MG tablet Take 1 tablet by mouth every 8 (eight) hours as needed. 01/12/24  Harden Jerona GAILS, MD  RABEprazole (ACIPHEX) 20 MG tablet Take 20 mg by mouth 2 (two) times daily.    [provider]  ticagrelor  (BRILINTA ) 90 MG TABS tablet Take 1 tablet (90 mg total) by mouth 2 (two) times daily. 01/12/24   Michele Richardson, DO    Family History Family History  Adopted: Yes  Problem Relation Age of Onset   Diabetes Mother    Hypertension Mother    Diabetes Father    Hypertension Father    Hypertension Sister    Diabetes Sister    Hypertension Brother    Diabetes Brother    Diabetes Paternal Grandmother    Cancer Other    Asthma Neg Hx    Allergic rhinitis Neg Hx    Atopy Neg Hx    Eczema Neg Hx     Social History Social History   Tobacco Use   Smoking status: Never    Passive exposure: Never   Smokeless tobacco: Never  Vaping Use   Vaping status: Never Used  Substance Use Topics   Alcohol use: Yes    Comment: rare   Drug use: Never     Allergies   Albuterol  sulfate, Tape, Formoterol, Hydrocodone , Hydrocodone -acetaminophen , Nickel, Other, Oxycodone -acetaminophen , Salmeterol, Strawberry extract, Advair hfa [fluticasone-salmeterol], Proventil  hfa [albuterol ], Asa [aspirin ], Cleocin [clindamycin], Clindamycin/lincomycin, Fludrocortisone acetate, Gabapentin, Hydrochlorothiazide, Imipramine, Ipratropium bromide, Medroxyprogesterone, Meloxicam, Metformin and related, Nystatin, Penicillins, Pred forte [prednisolone], Prednisone, Solu-medrol  [methylprednisolone ], Sulfa antibiotics, Tegaderm ag  mesh [silver], Toradol  [ketorolac  tromethamine ], and Tramadol   Review of Systems Review of Systems  Skin:  Positive for rash.   See HPI  Physical Exam Triage Vital Signs ED Triage Vitals  Encounter Vitals Group     BP 01/28/24 1330 124/82     Girls Systolic BP Percentile --      Girls Diastolic BP Percentile --      Boys Systolic BP Percentile --      Boys Diastolic BP Percentile --      Pulse Rate 01/28/24 1330 76     Resp 01/28/24 1330 20     Temp 01/28/24 1330 98.6 F (37 C)     Temp Source 01/28/24 1330 Oral     SpO2 01/28/24 1330 98 %     Weight --      Height --      Head Circumference --      Peak Flow --      Pain Score 01/28/24 1332 0     Pain Loc --      Pain Education --      Exclude from Growth Chart --    No data found.  Updated Vital Signs BP 124/82 (BP Location: Right Arm)   Pulse 76   Temp 98.6 F (37 C) (Oral)   Resp 20   SpO2 98%   Visual Acuity Right Eye Distance:   Left Eye Distance:   Bilateral Distance:    Right Eye Near:   Left Eye Near:    Bilateral Near:     Physical Exam Vitals and nursing note reviewed.  Constitutional:      General: She is not in acute distress.    Appearance: Normal appearance. She is not ill-appearing, toxic-appearing or diaphoretic.  Pulmonary:     Effort: Pulmonary effort is normal.   Skin:    Findings: Rash present.     Comments: Multiple circular lesions with clear center to arms, and stomach.    Neurological:     Mental Status: She  is alert.   Psychiatric:        Mood and Affect: Mood normal.      UC Treatments / Results  Labs (all labs ordered are listed, but only abnormal results are displayed) Labs Reviewed - No data to display  EKG   Radiology No results found.  Procedures Procedures (including critical care time)  Medications Ordered in UC Medications - No data to display  Initial Impression / Assessment and Plan / UC Course  I have reviewed the triage vital signs and  the nursing notes.  Pertinent labs & imaging results that were available during my care of the patient were reviewed by me and considered in my medical decision making (see chart for details).     Ring worm- start clotrimazole. May want to have dogs treated in case they are the source.  Follow up as needed.  Final Clinical Impressions(s) / UC Diagnoses   Final diagnoses:  Ringworm of body     Discharge Instructions      This is ringworm.  Recommend applying clotrimazole over-the-counter cream as prescribed. Also recommend potentially treating your dog in case they are the source.  There are special medicated shampoos you can buy to treat this.  Follow-up as needed   ED Prescriptions   None    PDMP not reviewed this encounter.   Adah Wilbert LABOR, FNP 01/28/24 1501

## 2024-01-28 NOTE — ED Triage Notes (Signed)
 Patient has multiple areas of a circular rash. Has noticed over last few days. Does have dogs. Unsure if ringworm or maybe just scoriasis. + itch

## 2024-01-29 NOTE — Therapy (Signed)
 OUTPATIENT PHYSICAL THERAPY SHOULDER EVALUATION   Patient Name: Casey [name] MRN: [id number] DOB:12/29/1970, 53 y.o., female Today's Date: 01/30/2024  END OF SESSION:  PT End of Session - 01/30/24 0848     Visit Number 1    Date for PT Re-Evaluation 03/26/24    PT Start Time 0848    PT Stop Time 0927    PT Time Calculation (min) 39 min    Activity Tolerance Patient tolerated treatment well    Behavior During Therapy [facility] for tasks assessed/performed          Past Medical History:  Diagnosis Date   Albuminuria 06/27/2015   Anxiety 11/04/2014   Arm DVT (deep venous thromboembolism), acute, left (HCC) 08/17/2022   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/30/2014   Asthma    Atopic dermatitis 10/30/2008   Formatting of this note might be different from the original. Atopic dermatitis Formatting of this note might be different from the original. Formatting of this note might be different from the original. Atopic dermatitis Formatting of this note might be different from the original. Formatting of this note might be different from the original. Formatting of this note might be different from the or   Cataract 05/06/2015   Contusion of left wrist 07/08/2021   Coronary artery disease s/p PCI/DES to LAD 07/01/2023 12/28/2022   Crohn's disease (HCC) 04/21/2015   De Quervain's tenosynovitis, left 07/31/2021   Diabetes mellitus without complication (HCC)    Diabetes type 2, controlled (HCC) 04/21/2015   Dry eyes 05/06/2015   DVT (deep venous thrombosis) (HCC)    Dysfunction of both eustachian tubes 08/31/2012   Encounter for monitoring immunomodulating therapy 11/20/2021   Environmental allergies 08/12/2020   Esophageal candidiasis (HCC) 06/26/2022   Essential hypertension 06/27/2015   Fibrositis 08/28/2014   Formatting of this note might be different from the original. Fibromyalgia Formatting of this note might be different from the original. Formatting of this note  might be different from the original. Fibromyalgia   Flank pain 11/24/2020   Gastroesophageal reflux disease with esophagitis without hemorrhage 08/01/2019   Formatting of this note might be different from the original. Formatting of this note might be different from the original. 07/2019: chronic symptoms of esophageal reflux since prior to sleeve gastrectomy surgery. Symptoms have worsened over the last 6 months with recent EGD findings of grade B esophagitis, irregular z line, erythematous mucosa, and evidence of sleeve gastrectomy. 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 Formatting of this note might be different from the original. 07/2019: 8 mm colonic polyp removed during colonoscopy, biopsy showed serrated polyp. Repeat colonoscopy recommended in one year. Formatting of this note might be different from the original. Added automatically from request for surgery [phone] Formatting of this note might b   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   History of kidney stones 08/12/2020   Hypercholesterolemia 10/07/2022   Hypertension, essential, benign 06/27/2015   Hypertensive disorder 08/28/2014   Formatting of this note might be different from the original. Hypertension Formatting of this note might be different from the original. Formatting of this note might be different from the original. 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 Keratoconus Formatting of this note might be different from the original. Formatting of this note might be different from the original.  Keratoconus Formatting of this note might be different from the original. Formatting of this note might be different from the original. Keratoconus Formatting of this note might be different from the or   Microhematuria 06/27/2015   Migraine 08/28/2014   Formatting of this note might be different from the original. Migraine Formatting of this note might be different from the original. Formatting of this note might be different from the original. Migraine Formatting of this note might be different from the original. Migraine Formatting of this note might be different from the original. Migraine Formatting of this note might be different from the or   Migraine without aura and without status migrainosus, not intractable 08/28/2014   Formatting of this note might be different from the original. Formatting of this note might be different from the original. Formatting of this note might be different from the original. Migraine Formatting of this note might be different from the original. Migraine Formatting of this note might be different from the original. Formatting of this note might be different from the original. Migraine F   Mild intermittent asthma without complication 10/30/2008   Formatting of this note might be different from the original. Formatting of this note might be different from the original. Formatting of this note might be different from the original. Asthma Formatting of this note might be different from the original. Asthma Formatting of this note might be different from the original. Formatting of this note might be different from the original. 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 Formatting of this note might be different from the original. 01/25/2016 with Dr. Loney Pol at [facility]. [place] of this note might be different from the original. 01/25/2016 with Dr. Loney Pol at [facility]. [place] of this note might be different from the original. Formatting of this note might be different from    Seronegative inflammatory arthritis 08/28/2014   Formatting of this note might be different from the original. Formatting of this note might be different from the original. Arthritis; diagnosed as IBD related - 2012 Formatting of this note might be different from the original. Followed by rheumatologist. Had to change providers due to an insurance change earlier this year resulting in patient not being able to have Remicade  for ~6 months. 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 Formatting of this note might be different from the original. Treated with PRN miralax . Recent colonoscopy 07/2019. Formatting of this note might be different from the original. Treated with PRN miralax . Recent colonoscopy 07/2019. Formatting of this  note might be different from the original. Formatting of this note might be different f   Status post placement of ureteral stent 02/06/2021   Tooth infection 08/12/2020   Unstable angina (HCC) 07/29/2023   Ureteral stone 01/20/2022   Urinary tract infection, site not specified 06/26/2022   Past Surgical History:  Procedure Laterality Date   BACK SURGERY     L5-S1    CERVICAL CONE BIOPSY  04/2000   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   CORONARY STENT INTERVENTION N/A 07/01/2023   Procedure: CORONARY STENT INTERVENTION;  Surgeon: Darron Deatrice LABOR, MD;  Location: [facility] IN[facility]IVE [facility];  Service: Cardiovascular;  Laterality: N/A;   EYE SURGERY     GASTRIC BYPASS  2017   Sleve   LAPAROSCOPIC GASTRIC SLEEVE RESECTION     LEFT HEART CATH AND CORONARY ANGIOGRAPHY N/A 07/01/2023   Procedure: LEFT HEART CATH AND CORONARY ANGIOGRAPHY;  Surgeon: Darron Deatrice LABOR, MD;  Location: [facility] INVAS[facility] CV [facility];  Service: Cardiovascular;  Laterality: N/A;   LEFT HEART CATH AND CORONARY ANGIOGRAPHY N/A 08/01/2023   Procedure: LEFT HEART CATH AND CORONARY ANGIOGRAPHY;  Surgeon: Ladona Heinz, MD;  Location: [facility] INVAS[facility] CV [facility];  Service: Cardiovascular;  Laterality: N/A;   Low back disc surgery     06/2000   TOTAL KNEE ARTHROPLASTY Right 11/2016   Patient Active Problem List   Diagnosis Date Noted   Pain in left shoulder 01/05/2024   Other chronic pain 12/14/2023   Concussion with loss of consciousness 10/11/2023   Hypokalemia 09/21/2023   Cellulitis 09/16/2023   Bilateral lower extremity edema 09/16/2023   Unstable angina (HCC) 07/29/2023   Moderate persistent asthma 07/29/2023   History of DVT (deep vein thrombosis) 06/30/2023   Chest pain, rule out acute myocardial infarction 06/29/2023   Nocturnal hypoxemia 06/13/2023   Oropharyngeal candidiasis 01/02/2023   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   Coronary artery disease s/p PCI/DES to LAD 07/01/2023 12/28/2022   Precordial chest pain 10/13/2022   Hyperlipidemia 10/07/2022   Hiatal hernia 10/07/2022   Esophageal candidiasis (HCC) 06/26/2022   Urinary tract infection, site not specified 06/26/2022   Ureteral stone 01/20/2022   Encounter for monitoring immunomodulating therapy 11/20/2021   Rheumatoid arthritis of multiple sites with 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 factor (HCC) 11/16/2021   Pyelonephritis 09/25/2021   De  Quervain's tenosynovitis, left 07/31/2021   Contusion of left wrist 07/08/2021   Status post placement of ureteral stent 02/06/2021   Proteinuria 08/12/2020   Environmental allergies 08/12/2020   History of kidney stones 08/12/2020   Tooth infection 08/12/2020   Gastroesophageal reflux disease with esophagitis without hemorrhage 08/01/2019   Slow transit constipation 08/01/2019   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   History of colon polyps 06/19/2019   Irregular astigmatism of both eyes 10/11/2018   Nephrolithiasis 09/08/2018   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   Plantar wart 11/04/2016   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   Myopia with astigmatism and presbyopia 05/07/2016   Overactive bladder 11/18/2015   Incontinence 10/07/2015   Albuminuria 06/27/2015   Essential hypertension 06/27/2015   Microhematuria 06/27/2015   Hematuria 06/18/2015   Cataract 05/06/2015   Dry eyes 05/06/2015   Crohn's disease (HCC) 04/21/2015   Osteoarthritis of both knees 04/21/2015   Right knee pain 04/21/2015   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/30/2014   History of abnormal cervical Pap smear 11/29/2014   Fibrositis 08/28/2014   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   Migraine without aura and without status migrainosus, not intractable 08/28/2014  Migraine 08/28/2014   Seronegative inflammatory arthritis 08/28/2014   Increased urinary frequency 07/16/2013   Dysfunction of both eustachian tubes 08/31/2012   Asthma 10/30/2008   Mild intermittent asthma without complication 10/30/2008    PCP: Rothfuss, Lang DASEN, PA-C   REFERRING PROVIDER: Valdemar Rocky SAUNDERS, NP   REFERRING DIAG: [phone] (ICD-10-CM) - Nontraumatic incomplete tear of left rotator cuff   THERAPY DIAG:  Acute pain of left shoulder  Stiffness of left shoulder joint  Rationale for Evaluation and Treatment: Rehabilitation  ONSET DATE: 12/19/23  SUBJECTIVE:                                                                                                                                                                                       SUBJECTIVE STATEMENT: Dog took off on leash spun her around and her shoulder hit the post on the porch and fell on the porch.  Hand dominance: Right  PERTINENT HISTORY: CAD, arm DVT Left, , HTN, gastroc bypass, RA  PAIN:  Are you having pain? Yes: NPRS scale: 5/10  up to 10/10 Pain location: left shoulder post > ant into neck Pain description: sharp knife into the front of the shoulder Aggravating factors: reaching behind her back  or to other shoulder, [place] Relieving factors: icing, hot shower, rest  PRECAUTIONS: None  RED FLAGS: None   WEIGHT BEARING RESTRICTIONS: No  FALLS:  Has patient fallen in last 6 months? Yes. 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  DIAGNOSTIC FINDINGS:  MRI FINDINGS:   Bones: Mild AC joint arthrosis with slight hypertrophy. No significant reactive edema. Glenohumeral joint is unremarkable.   Rotator cuff: Insertional tendinosis of the supraspinatus and infraspinatus tendons. Mild partial tear is suspected in the supraspinatus tendon. No significant partial or full-thickness tear. Subscapularis and teres minor tendons are intact. No fatty atrophy of the muscles.   Labrum and biceps tendon: Biceps tendon and labrum are unremarkable.     IMPRESSION: Mild AC joint arthrosis with slight hypertrophy of the AC joint.   Insertional tendinosis of the supraspinatus and infraspinatus tendons. Mild partial tear of the supraspinatus without significant partial or  full-thickness tear  PATIENT SURVEYS:  UEFS  Extreme difficulty/unable (0), Quite a bit of difficulty (1), Moderate difficulty (2), Little difficulty (3), No difficulty (4) Survey date:  Any of your usual work, household or school activities 0  2. Your usual hobbies, recreational/sport activities 1   3. Lifting a bag of groceries to waist level 1   4. Lifting a bag of groceries above your head 0  5. Grooming your hair 0  6. Pushing up on your hands (I.e. from bathtub or chair) 1  7. Preparing food (I.e. peeling/cutting) 0  8. Driving  2  9. Vacuuming, sweeping, or raking 3  10. Dressing  2  11. Doing up buttons 2  12. Using tools/appliances 1  13. Opening doors 1  14. Cleaning  0  15. Tying or lacing shoes 3  16. Sleeping  0  17. Laundering clothes (I.e. washing, ironing, folding) 1  18. Opening a jar 2  19. Throwing a ball 0  20. Carrying a Scientist, water quality with your affected limb.  0  Score total:  20/80     COGNITION: Overall cognitive status: Within functional limits for tasks assessed     SENSATION: Gets some numbness into her fingers and up in her neck if doing things for a long time  POSTURE: Mild rounded shoulders  CERVICAL ROM:  ext, rotation and SB limted by 30%   UPPER EXTREMITY ROM:   A/P ROM Right eval Left eval  Shoulder flexion  147/160  Shoulder extension  wnl  Shoulder abduction  167/180  Shoulder adduction    Shoulder internal rotation T7 Opp gluteal; 62/80  Shoulder external rotation  95  (Blank rows = not tested)  UPPER EXTREMITY MMT: L shoulder 4+/5 all planes, pain with all motions.  SHOULDER SPECIAL TESTS: Impingement tests: Neer impingement test: positive  and Hawkins/Kennedy impingement test: positive  Rotator cuff assessment: Empty can test: positive  and Full can test: negative  PALPATION:  Tender in pecs, all RC tendons, UT, LS, scapular muscles                                                                                                                              TREATMENT DATE:  01/30/24  See pt ed and HEP   PATIENT EDUCATION: Education details: PT eval findings, anticipated POC, initial HEP, and postural awareness   Person educated: Patient Education method: Explanation, Demonstration, Tactile cues, Verbal cues, and Handouts Education comprehension: verbalized understanding and returned demonstration  HOME EXERCISE PROGRAM: Access Code: [email or web address] URL: [email or web address] Date: 01/30/2024 Prepared by: Mliss  Exercises - Supine Shoulder Flexion Extension AAROM with Dowel  - 2 x daily - 7 x weekly - 2 sets - 10 reps - Standing Shoulder Abduction AAROM with Dowel  - 2 x daily - 7 x weekly - 2 sets - 10 reps - 3-5 sec hold - Isometric Shoulder Flexion at Wall  - 1 x daily - 7 x weekly - 1 sets - 5 reps - 10 sec hold - Isometric Shoulder Abduction - Arm Straight at Wall  - 1 x daily - 7 x weekly -  1 sets - 5 reps - 10 sec  hold - Standing Isometric Shoulder Internal Rotation at Doorway (Mirrored)  - 1 x daily - 7 x weekly - 1 sets - 5 reps - 10 sec hold - Standing Isometric Shoulder External Rotation with Doorway  - 1 x daily - 7 x weekly - 1 sets - 5 reps - 10 sec  hold - Seated Scapular Retraction  - 1 x daily - 7 x weekly - 1-3 sets - 10 reps - 2-3 sec hold  ASSESSMENT:  CLINICAL IMPRESSION: Patient is a 53 y.o. female who was seen today for physical therapy evaluation and treatment for L shoulder pain due to incomplete tear of L rotator cuff. She demonstrates deficits in L shoulder ROM and strength, posture and flexibility.These deficits affect her ability to reach overhead, type and sleep. She has significant pain with palpation and with all resisted motions.  She will benefit from skilled PT to address these deficits.    OBJECTIVE IMPAIRMENTS: decreased activity tolerance, decreased ROM, decreased strength, increased muscle spasms, impaired flexibility, impaired UE functional use,  postural dysfunction, and pain.   ACTIVITY LIMITATIONS: carrying, lifting, sleeping, bathing, dressing, reach over head, and hygiene/grooming  PARTICIPATION LIMITATIONS: meal prep, cleaning, laundry, driving, shopping, occupation, and yard work  PERSONAL FACTORS: Fitness and 3+ comorbidities: RA, CAD, HTN are also affecting patient's functional outcome.   REHAB POTENTIAL: Good  CLINICAL DECISION MAKING: Evolving/moderate complexity  EVALUATION COMPLEXITY: Moderate   GOALS: Goals reviewed with patient? Yes  SHORT TERM GOALS: Target date: 02/27/2024   Patient will be independent with initial HEP.  Baseline:  Goal status: INITIAL  2.  Patient able to sleep without waking from pain. Baseline:  Goal status: INITIAL  3.  Decreased pain by 25% with ADLs Baseline:  Goal status: INITIAL   LONG TERM GOALS: Target date: 03/26/2024   Patient will be independent with advanced/ongoing HEP to improve outcomes and carryover.  Baseline:  Goal status: INITIAL  2.  Patient will report 85% improvement in L shoulder pain with ADLS to improve QOL.  Baseline:  Goal status: INITIAL  3.  Patient to improve L shoulder AROM to [facility] without pain provocation to perform OH activities.  Baseline:  Goal status: INITIAL  4.  Patient will be able to type for her job with 3/10 pain or less.  Baseline:  Goal status: INITIAL  5. Improved UEFS by 9 points showing functional improvement. Baseline: 20/80 Goal status: INITIAL    PLAN:  PT FREQUENCY: 2x/week  PT DURATION: 8 weeks  PLANNED INTERVENTIONS: 97164- PT Re-evaluation, 97110-Therapeutic exercises, 97530- Therapeutic activity, 97112- Neuromuscular re-education, 97535- Self Care, 02859- Manual therapy, [id number]- Electrical stimulation (unattended), 97035- Ultrasound, 02966- Ionotophoresis 4mg /ml Dexamethasone , 79439 (1-2 muscles), 20561 (3+ muscles)- Dry Needling, Patient/Family education, Taping, Joint mobilization, Spinal mobilization,  Cryotherapy, and Moist heat  PLAN FOR NEXT SESSION: Left shoulder ROM, how are isometrics, progress PRE as tolerated, modalities for pain.   Mliss Cummins, PT 01/30/24 5:08 PM

## 2024-01-30 ENCOUNTER — Ambulatory Visit (INDEPENDENT_AMBULATORY_CARE_PROVIDER_SITE_OTHER): Admitting: *Deleted

## 2024-01-30 ENCOUNTER — Ambulatory Visit: Attending: Family | Admitting: Physical Therapy

## 2024-01-30 ENCOUNTER — Encounter: Payer: Self-pay | Admitting: Physical Therapy

## 2024-01-30 ENCOUNTER — Other Ambulatory Visit: Payer: Self-pay

## 2024-01-30 DIAGNOSIS — J309 Allergic rhinitis, unspecified: Secondary | ICD-10-CM | POA: Diagnosis not present

## 2024-01-30 DIAGNOSIS — M25612 Stiffness of left shoulder, not elsewhere classified: Secondary | ICD-10-CM | POA: Insufficient documentation

## 2024-01-30 DIAGNOSIS — M75112 Incomplete rotator cuff tear or rupture of left shoulder, not specified as traumatic: Secondary | ICD-10-CM | POA: Diagnosis not present

## 2024-01-30 DIAGNOSIS — M25512 Pain in left shoulder: Secondary | ICD-10-CM | POA: Diagnosis present

## 2024-01-31 ENCOUNTER — Ambulatory Visit: Admitting: Family

## 2024-02-01 ENCOUNTER — Ambulatory Visit (INDEPENDENT_AMBULATORY_CARE_PROVIDER_SITE_OTHER)

## 2024-02-01 DIAGNOSIS — J309 Allergic rhinitis, unspecified: Secondary | ICD-10-CM | POA: Diagnosis not present

## 2024-02-03 ENCOUNTER — Ambulatory Visit: Admitting: Physical Therapy

## 2024-02-07 ENCOUNTER — Ambulatory Visit: Attending: Family | Admitting: Rehabilitative and Restorative Service Providers"

## 2024-02-07 ENCOUNTER — Encounter: Payer: Self-pay | Admitting: Rehabilitative and Restorative Service Providers"

## 2024-02-07 DIAGNOSIS — M25612 Stiffness of left shoulder, not elsewhere classified: Secondary | ICD-10-CM | POA: Diagnosis present

## 2024-02-07 DIAGNOSIS — M25512 Pain in left shoulder: Secondary | ICD-10-CM | POA: Insufficient documentation

## 2024-02-07 NOTE — Therapy (Addendum)
 OUTPATIENT PHYSICAL THERAPY TREATMENT NOTE   Patient Name: Casey [name] MRN: [id number] DOB:01-15-1971, 53 y.o., female Today's Date: 02/07/2024  END OF SESSION:  PT End of Session - 02/07/24 0935     Visit Number 2    Date for PT Re-Evaluation 03/26/24    Authorization Type [employer] No Auth Required    PT Start Time 0930    PT Stop Time 1010    PT Time Calculation (min) 40 min    Activity Tolerance Patient tolerated treatment well    Behavior During Therapy WFL for tasks assessed/performed          Past Medical History:  Diagnosis Date   Albuminuria 06/27/2015   Anxiety 11/04/2014   Arm DVT (deep venous thromboembolism), acute, left (HCC) 08/17/2022   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/30/2014   Asthma    Atopic dermatitis 10/30/2008   Formatting of this note might be different from the original. Atopic dermatitis Formatting of this note might be different from the original. Formatting of this note might be different from the original. Atopic dermatitis Formatting of this note might be different from the original. Formatting of this note might be different from the original. Formatting of this note might be different from the or   Cataract 05/06/2015   Contusion of left wrist 07/08/2021   Coronary artery disease s/p PCI/DES to LAD 07/01/2023 12/28/2022   Crohn's disease (HCC) 04/21/2015   De Quervain's tenosynovitis, left 07/31/2021   Diabetes mellitus without complication (HCC)    Diabetes type 2, controlled (HCC) 04/21/2015   Dry eyes 05/06/2015   DVT (deep venous thrombosis) (HCC)    Dysfunction of both eustachian tubes 08/31/2012   Encounter for monitoring immunomodulating therapy 11/20/2021   Environmental allergies 08/12/2020   Esophageal candidiasis (HCC) 06/26/2022   Essential hypertension 06/27/2015   Fibrositis 08/28/2014   Formatting of this note might be different from the original. Fibromyalgia Formatting of this note might be different from  the original. Formatting of this note might be different from the original. Fibromyalgia   Flank pain 11/24/2020   Gastroesophageal reflux disease with esophagitis without hemorrhage 08/01/2019   Formatting of this note might be different from the original. Formatting of this note might be different from the original. 07/2019: chronic symptoms of esophageal reflux since prior to sleeve gastrectomy surgery. Symptoms have worsened over the last 6 months with recent EGD findings of grade B esophagitis, irregular z line, erythematous mucosa, and evidence of sleeve gastrectomy. 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 Formatting of this note might be different from the original. 07/2019: 8 mm colonic polyp removed during colonoscopy, biopsy showed serrated polyp. Repeat colonoscopy recommended in one year. Formatting of this note might be different from the original. Added automatically from request for surgery [phone] Formatting of this note might b   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   History of kidney stones 08/12/2020   Hypercholesterolemia 10/07/2022   Hypertension, essential, benign 06/27/2015   Hypertensive disorder 08/28/2014   Formatting of this note might be different from the original. Hypertension Formatting of this note might be different from the original. Formatting of this note might be different from the original. 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 Keratoconus Formatting of this note might be different from the original. Formatting of this note might be  different from the original. Keratoconus Formatting of this note might be different from the original. Formatting of this note might be different from the original. Keratoconus Formatting of this note might be different from the or   Microhematuria 06/27/2015   Migraine 08/28/2014   Formatting of this note might be different from the original. Migraine Formatting of this note might be different from the original. Formatting of this note might be different from the original. Migraine Formatting of this note might be different from the original. Migraine Formatting of this note might be different from the original. Migraine Formatting of this note might be different from the or   Migraine without aura and without status migrainosus, not intractable 08/28/2014   Formatting of this note might be different from the original. Formatting of this note might be different from the original. Formatting of this note might be different from the original. Migraine Formatting of this note might be different from the original. Migraine Formatting of this note might be different from the original. Formatting of this note might be different from the original. Migraine F   Mild intermittent asthma without complication 10/30/2008   Formatting of this note might be different from the original. Formatting of this note might be different from the original. Formatting of this note might be different from the original. Asthma Formatting of this note might be different from the original. Asthma Formatting of this note might be different from the original. Formatting of this note might be different from the original. 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 Formatting of this note might be different from the original. 01/25/2016 with Dr. Loney Pol at [facility]. [place] of this note might be different from the original. 01/25/2016 with Dr. Loney Pol at [facility]. [place] of this note might be different from the original. Formatting of this note might be different from    Seronegative inflammatory arthritis 08/28/2014   Formatting of this note might be different from the original. Formatting of this note might be different from the original. Arthritis; diagnosed as IBD related - 2012 Formatting of this note might be different from the original. Followed by rheumatologist. Had to change providers due to an insurance change earlier this year resulting in patient not being able to have Remicade  for ~6 months. 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 Formatting of this note might be different from the original. Treated with PRN miralax . Recent colonoscopy 07/2019. Formatting of this note might be different from the original. Treated  with PRN miralax . Recent colonoscopy 07/2019. Formatting of this note might be different from the original. Formatting of this note might be different f   Status post placement of ureteral stent 02/06/2021   Tooth infection 08/12/2020   Unstable angina (HCC) 07/29/2023   Ureteral stone 01/20/2022   Urinary tract infection, site not specified 06/26/2022   Past Surgical History:  Procedure Laterality Date    BACK SURGERY     L5-S1   CERVICAL CONE BIOPSY  04/2000   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   CORONARY STENT INTERVENTION N/A 07/01/2023   Procedure: CORONARY STENT INTERVENTION;  Surgeon: Darron Deatrice LABOR, MD;  Location: [facility];  Service: Cardiovascular;  Laterality: N/A;   EYE SURGERY     GASTRIC BYPASS  2017   Sleve   LAPAROSCOPIC GASTRIC SLEEVE RESECTION     LEFT HEART CATH AND CORONARY ANGIOGRAPHY N/A 07/01/2023   Procedure: LEFT HEART CATH AND CORONARY ANGIOGRAPHY;  Surgeon: Darron Deatrice LABOR, MD;  Location: [facility];  Service: Cardiovascular;  Laterality: N/A;   LEFT HEART CATH AND CORONARY ANGIOGRAPHY N/A 08/01/2023   Procedure: LEFT HEART CATH AND CORONARY ANGIOGRAPHY;  Surgeon: Ladona Heinz, MD;  Location: [facility];  Service: Cardiovascular;  Laterality: N/A;   Low back disc surgery     06/2000   TOTAL KNEE ARTHROPLASTY Right 11/2016   Patient Active Problem List   Diagnosis Date Noted   Pain in left shoulder 01/05/2024   Other chronic pain 12/14/2023   Concussion with loss of consciousness 10/11/2023   Hypokalemia 09/21/2023   Cellulitis 09/16/2023   Bilateral lower extremity edema 09/16/2023   Unstable angina (HCC) 07/29/2023   Moderate persistent asthma 07/29/2023   History of DVT (deep vein thrombosis) 06/30/2023   Chest pain, rule out acute myocardial infarction 06/29/2023   Nocturnal hypoxemia 06/13/2023   Oropharyngeal candidiasis 01/02/2023   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   Coronary artery disease s/p PCI/DES to LAD 07/01/2023 12/28/2022   Precordial chest pain 10/13/2022   Hyperlipidemia 10/07/2022   Hiatal hernia 10/07/2022   Esophageal candidiasis (HCC) 06/26/2022   Urinary tract infection, site not specified 06/26/2022   Ureteral stone 01/20/2022   Encounter for monitoring immunomodulating therapy 11/20/2021   Rheumatoid arthritis of multiple sites with 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 factor (HCC) 11/16/2021    Pyelonephritis 09/25/2021   De Quervain's tenosynovitis, left 07/31/2021   Contusion of left wrist 07/08/2021   Status post placement of ureteral stent 02/06/2021   Proteinuria 08/12/2020   Environmental allergies 08/12/2020   History of kidney stones 08/12/2020   Tooth infection 08/12/2020   Gastroesophageal reflux disease with esophagitis without hemorrhage 08/01/2019   Slow transit constipation 08/01/2019   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   History of colon polyps 06/19/2019   Irregular astigmatism of both eyes 10/11/2018   Nephrolithiasis 09/08/2018   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   Plantar wart 11/04/2016   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   Myopia with astigmatism and presbyopia 05/07/2016   Overactive bladder 11/18/2015   Incontinence 10/07/2015   Albuminuria 06/27/2015   Essential hypertension 06/27/2015   Microhematuria 06/27/2015   Hematuria 06/18/2015   Cataract 05/06/2015   Dry eyes 05/06/2015   Crohn's disease (HCC) 04/21/2015   Osteoarthritis of both knees 04/21/2015   Right knee pain 04/21/2015   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/30/2014   History of abnormal cervical Pap smear 11/29/2014   Fibrositis 08/28/2014   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   Migraine  without aura and without status migrainosus, not intractable 08/28/2014   Migraine 08/28/2014   Seronegative inflammatory arthritis 08/28/2014   Increased urinary frequency 07/16/2013   Dysfunction of both eustachian tubes 08/31/2012   Asthma 10/30/2008   Mild intermittent asthma without complication 10/30/2008    PCP: Rothfuss, Lang DASEN, PA-C   REFERRING PROVIDER: Valdemar Rocky SAUNDERS, NP   REFERRING DIAG: [phone] (ICD-10-CM) - Nontraumatic incomplete tear of left rotator cuff   THERAPY DIAG:  Acute pain of left shoulder  Stiffness of left shoulder joint  Rationale for Evaluation and Treatment: Rehabilitation  ONSET DATE:  12/19/23  SUBJECTIVE:                                                                                                                                                                                      SUBJECTIVE STATEMENT: Patient states that later in the day after her PT evaluation, she passed out due to heat exhaustion.  States that she properly hydrated and is feeling better.  Hand dominance: Right  PERTINENT HISTORY: CAD, arm DVT Left, , HTN, gastroc bypass, RA  Reports heat intolerance due to RA  PAIN:  Are you having pain? Yes: NPRS scale: 7/10 Pain location: left shoulder post > ant into neck Pain description: sharp knife into the front of the shoulder Aggravating factors: reaching behind her back  or to other shoulder, [place] Relieving factors: icing, hot shower, rest  PRECAUTIONS: None  RED FLAGS: None   WEIGHT BEARING RESTRICTIONS: No  FALLS:  Has patient fallen in last 6 months? Yes. 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  DIAGNOSTIC FINDINGS:  MRI FINDINGS:   Bones: Mild AC joint arthrosis with slight hypertrophy. No significant reactive edema. Glenohumeral joint is unremarkable.   Rotator cuff: Insertional tendinosis of the supraspinatus and infraspinatus tendons. Mild partial tear is suspected in the supraspinatus tendon. No significant partial or full-thickness tear. Subscapularis and teres minor tendons are intact. No fatty atrophy of the muscles.   Labrum and biceps tendon: Biceps tendon and labrum are unremarkable.     IMPRESSION: Mild AC joint arthrosis with slight hypertrophy of the AC joint.   Insertional  tendinosis of the supraspinatus and infraspinatus tendons. Mild partial tear of the supraspinatus without significant partial or full-thickness tear  PATIENT SURVEYS:  UEFS  Extreme difficulty/unable (0),  Quite a bit of difficulty (1), Moderate difficulty (2), Little difficulty (3), No difficulty (4) Survey date:    Any of your usual work, household or school activities 0  2. Your usual hobbies, recreational/sport activities 1   3. Lifting a bag of groceries to waist level 1   4. Lifting a bag of groceries above your head 0  5. Grooming your hair 0  6. Pushing up on your hands (I.e. from bathtub or chair) 1  7. Preparing food (I.e. peeling/cutting) 0  8. Driving  2  9. Vacuuming, sweeping, or raking 3  10. Dressing  2  11. Doing up buttons 2  12. Using tools/appliances 1  13. Opening doors 1  14. Cleaning  0  15. Tying or lacing shoes 3  16. Sleeping  0  17. Laundering clothes (I.e. washing, ironing, folding) 1  18. Opening a jar 2  19. Throwing a ball 0  20. Carrying a Scientist, water quality with your affected limb.  0  Score total:  20/80     COGNITION: Overall cognitive status: Within functional limits for tasks assessed     SENSATION: Gets some numbness into her fingers and up in her neck if doing things for a long time  POSTURE: Mild rounded shoulders  CERVICAL ROM:  ext, rotation and SB limted by 30%   UPPER EXTREMITY ROM:   A/P ROM Right eval Left eval  Shoulder flexion  147/160  Shoulder extension  wnl  Shoulder abduction  167/180  Shoulder adduction    Shoulder internal rotation T7 Opp gluteal; 62/80  Shoulder external rotation  95  (Blank rows = not tested)  UPPER EXTREMITY MMT: L shoulder 4+/5 all planes, pain with all motions.  SHOULDER SPECIAL TESTS: Impingement tests: Neer impingement test: positive  and Hawkins/Kennedy impingement test: positive  Rotator cuff assessment: Empty can test: positive  and Full can test: negative  PALPATION:  Tender in  pecs, all RC tendons, UT, LS, scapular muscles                                                                                                                             TREATMENT DATE:  02/07/2024: Seated shoulder pulleys for flexion and abduction x2 min each Seated blue pball rollout x10 Left 4D shoulder pendulums x10 Left shoulder isometrics for flexion, abduction, and extension x10 each Supine shoulder flexion AA/ROM with dowel rod x10 Supine chest press with dowel rod x10 Supine left shoulder tracing of alphabet for scapular stability A-Z Seated shoulder shrugs x10   01/30/24   See pt ed and HEP   PATIENT EDUCATION: Education details: PT eval findings, anticipated POC, initial HEP, and postural awareness   Person educated: Patient Education method: Explanation, Demonstration, Tactile cues, Verbal cues, and Handouts Education comprehension: verbalized understanding and returned demonstration  HOME EXERCISE PROGRAM: Access Code: [email or web address] URL: [email or web address] Date: 01/30/2024 Prepared by: Mliss  Exercises - Supine Shoulder Flexion Extension AAROM with Dowel  -  2 x daily - 7 x weekly - 2 sets - 10 reps - Standing Shoulder Abduction AAROM with Dowel  - 2 x daily - 7 x weekly - 2 sets - 10 reps - 3-5 sec hold - Isometric Shoulder Flexion at Wall  - 1 x daily - 7 x weekly - 1 sets - 5 reps - 10 sec hold - Isometric Shoulder Abduction - Arm Straight at Wall  - 1 x daily - 7 x weekly - 1 sets - 5 reps - 10 sec  hold - Standing Isometric Shoulder Internal Rotation at Doorway (Mirrored)  - 1 x daily - 7 x weekly - 1 sets - 5 reps - 10 sec hold - Standing Isometric Shoulder External Rotation with Doorway  - 1 x daily - 7 x weekly - 1 sets - 5 reps - 10 sec  hold - Seated Scapular Retraction  - 1 x daily - 7 x weekly - 1-3 sets - 10 reps - 2-3 sec hold  ASSESSMENT:  CLINICAL IMPRESSION: Deb presents to skilled PT reporting that some of the exercises have been  hurting.  Patient educated on a more submaximal force with the isometrics.  Patient states that her follow up with the MD is coming up later this week to assess her healing.  Patient requires a slow progression secondary to her limits of pain.  Educated patient to use caution with exercises and not push too far past pain point.  OBJECTIVE IMPAIRMENTS: decreased activity tolerance, decreased ROM, decreased strength, increased muscle spasms, impaired flexibility, impaired UE functional use, postural dysfunction, and pain.   ACTIVITY LIMITATIONS: carrying, lifting, sleeping, bathing, dressing, reach over head, and hygiene/grooming  PARTICIPATION LIMITATIONS: meal prep, cleaning, laundry, driving, shopping, occupation, and yard work  PERSONAL FACTORS: Fitness and 3+ comorbidities: RA, CAD, HTN are also affecting patient's functional outcome.   REHAB POTENTIAL: Good  CLINICAL DECISION MAKING: Evolving/moderate complexity  EVALUATION COMPLEXITY: Moderate   GOALS: Goals reviewed with patient? Yes  SHORT TERM GOALS: Target date: 02/27/2024   Patient will be independent with initial HEP.  Baseline:  Goal status: Ongoing  2.  Patient able to sleep without waking from pain. Baseline:  Goal status: INITIAL  3.  Decreased pain by 25% with ADLs Baseline:  Goal status: INITIAL   LONG TERM GOALS: Target date: 03/26/2024   Patient will be independent with advanced/ongoing HEP to improve outcomes and carryover.  Baseline:  Goal status: INITIAL  2.  Patient will report 85% improvement in L shoulder pain with ADLS to improve QOL.  Baseline:  Goal status: INITIAL  3.  Patient to improve L shoulder AROM to [facility] without pain provocation to perform OH activities.  Baseline:  Goal status: INITIAL  4.  Patient will be able to type for her job with 3/10 pain or less.  Baseline:  Goal status: INITIAL  5. Improved UEFS by 9 points showing functional improvement. Baseline: 20/80 Goal status:  INITIAL    PLAN:  PT FREQUENCY: 2x/week  PT DURATION: 8 weeks  PLANNED INTERVENTIONS: 97164- PT Re-evaluation, 97110-Therapeutic exercises, 97530- Therapeutic activity, 97112- Neuromuscular re-education, 97535- Self Care, 02859- Manual therapy, G0283- Electrical stimulation (unattended), 97035- Ultrasound, 02966- Ionotophoresis 4mg /ml Dexamethasone , 79439 (1-2 muscles), 20561 (3+ muscles)- Dry Needling, Patient/Family education, Taping, Joint mobilization, Spinal mobilization, Cryotherapy, and Moist heat  PLAN FOR NEXT SESSION: Left shoulder ROM, how are isometrics, progress PRE as tolerated, modalities for pain.   Jarrell Laming, PT, DPT 02/07/24, 10:14 AM  [facility]  [address] [place], [place] [address] Phone # [phone] Fax [phone]

## 2024-02-08 ENCOUNTER — Ambulatory Visit (INDEPENDENT_AMBULATORY_CARE_PROVIDER_SITE_OTHER)

## 2024-02-08 ENCOUNTER — Ambulatory Visit: Payer: Self-pay

## 2024-02-08 DIAGNOSIS — J309 Allergic rhinitis, unspecified: Secondary | ICD-10-CM

## 2024-02-08 NOTE — Telephone Encounter (Signed)
 FYI Only or Action Required?: FYI only for provider.  Patient was last seen in primary care on 07/18/2023 by Kennyth Domino, FNP. Called Nurse Triage reporting Dizziness. Symptoms began x 2 weeks. Interventions attempted: Rest, hydration, or home remedies. Symptoms are: unchanged.  Triage Disposition: See Physician Within 24 Hours  Patient/caregiver understands and will follow disposition?: Yes  **Pt. Scheduled for 7/7**                              Copied from CRM [id number]. Topic: Clinical - Red Word Triage >> Feb 08, 2024  1:00 PM Rachelle R wrote: [facility] that prompted transfer to Nurse Triage: Patient is experiencing dizziness when she bends and stands back up. Passed out on Monday, was walking back from taking the trash out. Took a break and sat down, got up took two steps and passed out. Reason for Disposition  [1] MODERATE dizziness (e.g., interferes with normal activities) AND [2] has NOT been evaluated by doctor (or NP/PA) for this  (Exception: Dizziness caused by heat exposure, sudden standing, or poor fluid intake.)  Answer Assessment - Initial Assessment Questions 1. DESCRIPTION: Describe your dizziness.     When standing she gets dizzy, has migraines, room spinning. If she relaxes it helps   2. LIGHTHEADED: Do you feel lightheaded? (e.g., somewhat faint, woozy, weak upon standing)     Yes, light headed   3. VERTIGO: Do you feel like either you or the room is spinning or tilting? (i.e. vertigo)      Not currently   4. SEVERITY: How bad is it?  Do you feel like you are going to faint? Can you stand and walk?   - MILD: Feels slightly dizzy, but walking normally.   - MODERATE: Feels unsteady when walking, but not falling; interferes with normal activities (e.g., school, work).   - SEVERE: Unable to walk without falling, or requires assistance to walk without falling; feels like passing out now.       Moderate, now mild    5. ONSET:   When did the dizziness begin?      X several months, x 2 weeks symptoms have returned  6. AGGRAVATING FACTORS: Does anything make it worse? (e.g., standing, change in head position)     Sitting helps   7. HEART RATE: Can you tell me your heart rate? How many beats in 15 seconds?  (Note: not all patients can do this)       No   8. CAUSE: What do you think is causing the dizziness?     Unknown   9. RECURRENT SYMPTOM: Have you had dizziness before? If Yes, ask: When was the last time? What happened that time?     Yes, February she passed out, passed out again last Wednesday, she suspected heat at that time   10. OTHER SYMPTOMS: Do you have any other symptoms? (e.g., fever, chest pain, vomiting, diarrhea, bleeding)       Headache.   Patient offered the soonest available appointment. She will seek care in the UC/ED if symptoms worsen/persist.  Protocols used: Dizziness - Lightheadedness-A-AH

## 2024-02-09 ENCOUNTER — Ambulatory Visit: Admitting: Physical Therapy

## 2024-02-09 DIAGNOSIS — M25512 Pain in left shoulder: Secondary | ICD-10-CM

## 2024-02-09 DIAGNOSIS — M25612 Stiffness of left shoulder, not elsewhere classified: Secondary | ICD-10-CM

## 2024-02-09 NOTE — Therapy (Signed)
 OUTPATIENT PHYSICAL THERAPY SHOULDER PROGRESS NOTE   Patient Name: Casey [name] MRN: [id number] DOB:1970-08-31, 53 y.o., female Today's Date: 02/09/2024  END OF SESSION:  PT End of Session - 02/09/24 0935     Visit Number 3    Date for PT Re-Evaluation 03/26/24    Authorization Type UHC No Auth Required    PT Start Time 0933    PT Stop Time 1014    PT Time Calculation (min) 41 min    Activity Tolerance Patient tolerated treatment well          Past Medical History:  Diagnosis Date   Albuminuria 06/27/2015   Anxiety 11/04/2014   Arm DVT (deep venous thromboembolism), acute, left (HCC) 08/17/2022   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/30/2014   Asthma    Atopic dermatitis 10/30/2008   Formatting of this note might be different from the original. Atopic dermatitis Formatting of this note might be different from the original. Formatting of this note might be different from the original. Atopic dermatitis Formatting of this note might be different from the original. Formatting of this note might be different from the original. Formatting of this note might be different from the or   Cataract 05/06/2015   Contusion of left wrist 07/08/2021   Coronary artery disease s/p PCI/DES to LAD 07/01/2023 12/28/2022   Crohn's disease (HCC) 04/21/2015   De Quervain's tenosynovitis, left 07/31/2021   Diabetes mellitus without complication (HCC)    Diabetes type 2, controlled (HCC) 04/21/2015   Dry eyes 05/06/2015   DVT (deep venous thrombosis) (HCC)    Dysfunction of both eustachian tubes 08/31/2012   Encounter for monitoring immunomodulating therapy 11/20/2021   Environmental allergies 08/12/2020   Esophageal candidiasis (HCC) 06/26/2022   Essential hypertension 06/27/2015   Fibrositis 08/28/2014   Formatting of this note might be different from the original. Fibromyalgia Formatting of this note might be different from the original. Formatting of this note might be  different from the original. Fibromyalgia   Flank pain 11/24/2020   Gastroesophageal reflux disease with esophagitis without hemorrhage 08/01/2019   Formatting of this note might be different from the original. Formatting of this note might be different from the original. 07/2019: chronic symptoms of esophageal reflux since prior to sleeve gastrectomy surgery. Symptoms have worsened over the last 6 months with recent EGD findings of grade B esophagitis, irregular z line, erythematous mucosa, and evidence of sleeve gastrectomy. 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 Formatting of this note might be different from the original. 07/2019: 8 mm colonic polyp removed during colonoscopy, biopsy showed serrated polyp. Repeat colonoscopy recommended in one year. Formatting of this note might be different from the original. Added automatically from request for surgery [phone] Formatting of this note might b   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   History of kidney stones 08/12/2020   Hypercholesterolemia 10/07/2022   Hypertension, essential, benign 06/27/2015   Hypertensive disorder 08/28/2014   Formatting of this note might be different from the original. Hypertension Formatting of this note might be different from the original. Formatting of this note might be different from the original. 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 Keratoconus Formatting of this note might be different from the original. Formatting of this note might be different from the original. Keratoconus  Formatting of this note might be different from the original. Formatting of this note might be different from the original. Keratoconus Formatting of this note might be different from the or   Microhematuria 06/27/2015   Migraine 08/28/2014   Formatting of this note might be different from the original. Migraine Formatting of this note might be different from the original. Formatting of this note might be different from the original. Migraine Formatting of this note might be different from the original. Migraine Formatting of this note might be different from the original. Migraine Formatting of this note might be different from the or   Migraine without aura and without status migrainosus, not intractable 08/28/2014   Formatting of this note might be different from the original. Formatting of this note might be different from the original. Formatting of this note might be different from the original. Migraine Formatting of this note might be different from the original. Migraine Formatting of this note might be different from the original. Formatting of this note might be different from the original. Migraine F   Mild intermittent asthma without complication 10/30/2008   Formatting of this note might be different from the original. Formatting of this note might be different from the original. Formatting of this note might be different from the original. Asthma Formatting of this note might be different from the original. Asthma Formatting of this note might be different from the original. Formatting of this note might be different from the original. 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 Formatting of this note might be different from the original. 01/25/2016 with Dr. Loney Pol at [facility]. [place] of this note might be different from the original. 01/25/2016 with Dr. Loney Pol at [facility]. [place] of this note might be different from the original. Formatting of this note might be different from    Seronegative inflammatory arthritis 08/28/2014   Formatting of this note might be different from the original. Formatting of this note might be different from the original. Arthritis; diagnosed as IBD related - 2012 Formatting of this note might be different from the original. Followed by rheumatologist. Had to change providers due to an insurance change earlier this year resulting in patient not being able to have Remicade  for ~6 months. 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 Formatting of this note might be different from the original. Treated with PRN miralax . Recent colonoscopy 07/2019. Formatting of this note might be different from the original. Treated with PRN miralax . Recent colonoscopy 07/2019. Formatting of this  note might be different from the original. Formatting of this note might be different f   Status post placement of ureteral stent 02/06/2021   Tooth infection 08/12/2020   Unstable angina (HCC) 07/29/2023   Ureteral stone 01/20/2022   Urinary tract infection, site not specified 06/26/2022   Past Surgical History:  Procedure Laterality Date   BACK SURGERY     L5-S1   CERVICAL CONE  BIOPSY  04/2000   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   CORONARY STENT INTERVENTION N/A 07/01/2023   Procedure: CORONARY STENT INTERVENTION;  Surgeon: Darron Deatrice LABOR, MD;  Location: [facility];  Service: Cardiovascular;  Laterality: N/A;   EYE SURGERY     GASTRIC BYPASS  2017   Sleve   LAPAROSCOPIC GASTRIC SLEEVE RESECTION     LEFT HEART CATH AND CORONARY ANGIOGRAPHY N/A 07/01/2023   Procedure: LEFT HEART CATH AND CORONARY ANGIOGRAPHY;  Surgeon: Darron Deatrice LABOR, MD;  Location: [facility];  Service: Cardiovascular;  Laterality: N/A;   LEFT HEART CATH AND CORONARY ANGIOGRAPHY N/A 08/01/2023   Procedure: LEFT HEART CATH AND CORONARY ANGIOGRAPHY;  Surgeon: Ladona Heinz, MD;  Location: [facility];  Service: Cardiovascular;  Laterality: N/A;   Low back disc surgery     06/2000   TOTAL KNEE ARTHROPLASTY Right 11/2016   Patient Active Problem List   Diagnosis Date Noted   Pain in left shoulder 01/05/2024   Other chronic pain 12/14/2023   Concussion with loss of consciousness 10/11/2023   Hypokalemia 09/21/2023   Cellulitis 09/16/2023   Bilateral lower extremity edema 09/16/2023   Unstable angina (HCC) 07/29/2023   Moderate persistent asthma 07/29/2023   History of DVT (deep vein thrombosis) 06/30/2023   Chest pain, rule out acute myocardial infarction 06/29/2023   Nocturnal hypoxemia 06/13/2023   Oropharyngeal candidiasis 01/02/2023   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   Coronary artery disease s/p PCI/DES to LAD 07/01/2023 12/28/2022   Precordial chest pain 10/13/2022   Hyperlipidemia 10/07/2022   Hiatal hernia 10/07/2022   Esophageal candidiasis (HCC) 06/26/2022   Urinary tract infection, site not specified 06/26/2022   Ureteral stone 01/20/2022   Encounter for monitoring immunomodulating therapy 11/20/2021   Rheumatoid arthritis of multiple sites with 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 factor (HCC) 11/16/2021   Pyelonephritis 09/25/2021   De Quervain's  tenosynovitis, left 07/31/2021   Contusion of left wrist 07/08/2021   Status post placement of ureteral stent 02/06/2021   Proteinuria 08/12/2020   Environmental allergies 08/12/2020   History of kidney stones 08/12/2020   Tooth infection 08/12/2020   Gastroesophageal reflux disease with esophagitis without hemorrhage 08/01/2019   Slow transit constipation 08/01/2019   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   History of colon polyps 06/19/2019   Irregular astigmatism of both eyes 10/11/2018   Nephrolithiasis 09/08/2018   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   Plantar wart 11/04/2016   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   Myopia with astigmatism and presbyopia 05/07/2016   Overactive bladder 11/18/2015   Incontinence 10/07/2015   Albuminuria 06/27/2015   Essential hypertension 06/27/2015   Microhematuria 06/27/2015   Hematuria 06/18/2015   Cataract 05/06/2015   Dry eyes 05/06/2015   Crohn's disease (HCC) 04/21/2015   Osteoarthritis of both knees 04/21/2015   Right knee pain 04/21/2015   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/30/2014   History of abnormal cervical Pap smear 11/29/2014   Fibrositis 08/28/2014   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   Migraine without aura and without status migrainosus, not intractable 08/28/2014  Migraine 08/28/2014   Seronegative inflammatory arthritis 08/28/2014   Increased urinary frequency 07/16/2013   Dysfunction of both eustachian tubes 08/31/2012   Asthma 10/30/2008   Mild intermittent asthma without complication 10/30/2008    PCP: Rothfuss, Lang DASEN, PA-C   REFERRING PROVIDER: Valdemar Rocky SAUNDERS, NP   REFERRING DIAG: [phone] (ICD-10-CM) - Nontraumatic incomplete tear of left rotator cuff   THERAPY DIAG:  Acute pain of left shoulder  Stiffness of left shoulder joint  Rationale for Evaluation and Treatment: Rehabilitation  ONSET DATE: 12/19/23  SUBJECTIVE:                                                                                                                                                                                       SUBJECTIVE STATEMENT: I'm hurting. I didn't sleep well so I had to medicate over night and before I came here.  After therapy I hurt.  Horizontal position is painful.   I got dizzy yesterday and felt like I was going to pass out.  Had testing in the past and BP went up with rising.  I'm on meds that make me heat intolerant.  I'm going to discuss this with my PCP.   Hand dominance: Right  PERTINENT HISTORY: CAD, arm DVT Left, , HTN, gastroc bypass, RA Allergy  to Neoprene, allergic to surgical tape Doesn't respond well to TENS  Reports heat intolerance due to RA  PAIN:  Are you having pain? Yes: NPRS scale: 8/10 without medication Pain location: left shoulder post > ant into neck Pain description: sharp knife into the front of the shoulder Aggravating factors: reaching behind her back  or to other shoulder, [place] Relieving factors: icing, hot shower, rest  PRECAUTIONS: None  RED FLAGS: None   WEIGHT BEARING RESTRICTIONS: No  FALLS:  Has patient fallen in last 6 months? Yes. 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  DIAGNOSTIC FINDINGS:  MRI FINDINGS:   Bones: Mild AC joint arthrosis with slight hypertrophy. No significant reactive edema. Glenohumeral joint is unremarkable.   Rotator cuff: Insertional tendinosis of the supraspinatus and infraspinatus tendons. Mild partial tear is suspected in the supraspinatus tendon. No significant partial or full-thickness tear. Subscapularis and teres minor tendons  are intact. No fatty atrophy of the muscles.   Labrum and biceps tendon: Biceps tendon and labrum  are unremarkable.     IMPRESSION: Mild AC joint arthrosis with slight hypertrophy of the AC joint.   Insertional tendinosis of the supraspinatus and infraspinatus tendons. Mild partial tear of the supraspinatus without significant partial or full-thickness tear  PATIENT SURVEYS:  UEFS  Extreme difficulty/unable (0), Quite a bit of difficulty (1), Moderate difficulty (2), Little difficulty (3), No difficulty (4) Survey date:    Any of your usual work, household or school activities 0  2. Your usual hobbies, recreational/sport activities 1   3. Lifting a bag of groceries to waist level 1   4. Lifting a bag of groceries above your head 0  5. Grooming your hair 0  6. Pushing up on your hands (I.e. from bathtub or chair) 1  7. Preparing food (I.e. peeling/cutting) 0  8. Driving  2  9. Vacuuming, sweeping, or raking 3  10. Dressing  2  11. Doing up buttons 2  12. Using tools/appliances 1  13. Opening doors 1  14. Cleaning  0  15. Tying or lacing shoes 3  16. Sleeping  0  17. Laundering clothes (I.e. washing, ironing, folding) 1  18. Opening a jar 2  19. Throwing a ball 0  20. Carrying a Scientist, water quality with your affected limb.  0  Score total:  20/80     COGNITION: Overall cognitive status: Within functional limits for tasks assessed     SENSATION: Gets some numbness into her fingers and up in her neck if doing things for a long time  POSTURE: Mild rounded shoulders  CERVICAL ROM:  ext, rotation and SB limted by 30%   UPPER EXTREMITY ROM:   A/P ROM Right eval Left eval  Shoulder flexion  147/160  Shoulder extension  wnl  Shoulder abduction  167/180  Shoulder adduction    Shoulder internal rotation T7 Opp gluteal; 62/80  Shoulder external rotation  95  (Blank rows = not tested)  UPPER EXTREMITY MMT: L shoulder 4+/5 all planes, pain with all motions.  SHOULDER  SPECIAL TESTS: Impingement tests: Neer impingement test: positive  and Hawkins/Kennedy impingement test: positive  Rotator cuff assessment: Empty can test: positive  and Full can test: negative  PALPATION:  Tender in pecs, all RC tendons, UT, LS, scapular muscles                                                                                                                             TREATMENT DATE:  02/08/2024: Discussion of dizziness/ heat intolerance and response to treatment Discussion on exercise modification- hold on isometrics since too painful Discussed shoulder ROM principles for the least amount of pain:  supine dowel, seated table slides, counter walk aways Bicep curls, shoulder shrugs to promote blood flow for healing Discussed arm postioning to promote blood flow for healing  UE Ranger forward/back 20x no strap (allergic to neoprene) Low row yellow band bilateral (anchored to doorknob) 10x Yellow band bil shoulder extension (anchored to doorknob)  10x Pt given yellow band for home use KT 2 vertical strips: anterior deltoid, posterior deltoid and 1 horizontal strip across distal deltoid insertions; pt advised to remove before bedtime to test tolerance to tape secondary to many allergies (or sooner if itching or redness occurs)    02/07/2024: Seated shoulder pulleys for flexion and abduction x2 min each Seated blue pball rollout x10 Left 4D shoulder pendulums x10 Left shoulder isometrics for flexion, abduction, and extension x10 each Supine shoulder flexion AA/ROM with dowel rod x10 Supine chest press with dowel rod x10 Supine left shoulder tracing of alphabet for scapular stability A-Z Seated shoulder shrugs x10   01/30/24   See pt ed and HEP   PATIENT EDUCATION: Education details: PT eval findings, anticipated POC, initial HEP, and postural awareness   Person educated: Patient Education method: Explanation, Demonstration, Tactile cues, Verbal cues, and  Handouts Education comprehension: verbalized understanding and returned demonstration  HOME EXERCISE PROGRAM: Access Code: [email or web address] URL: [email or web address] Date: 01/30/2024 Prepared by: Mliss  Exercises - Supine Shoulder Flexion Extension AAROM with Dowel  - 2 x daily - 7 x weekly - 2 sets - 10 reps - Standing Shoulder Abduction AAROM with Dowel  - 2 x daily - 7 x weekly - 2 sets - 10 reps - 3-5 sec hold - Isometric Shoulder Flexion at Wall  - 1 x daily - 7 x weekly - 1 sets - 5 reps - 10 sec hold - Isometric Shoulder Abduction - Arm Straight at Wall  - 1 x daily - 7 x weekly - 1 sets - 5 reps - 10 sec  hold - Standing Isometric Shoulder Internal Rotation at Doorway (Mirrored)  - 1 x daily - 7 x weekly - 1 sets - 5 reps - 10 sec hold - Standing Isometric Shoulder External Rotation with Doorway  - 1 x daily - 7 x weekly - 1 sets - 5 reps - 10 sec  hold - Seated Scapular Retraction  - 1 x daily - 7 x weekly - 1-3 sets - 10 reps - 2-3 sec hold  ASSESSMENT:  CLINICAL IMPRESSION: Patient rates her pain as severe and is having trouble tolerating the exercises.  Therapist instructed modifications for active assisted ROM and using gravity to assist.  She tolerated yellow (light) band resisted low rows and extensions well and feels she is able to do these at home.  She is also interested in trying KT tape for pain relief and glenohumeral support.  Given her skin sensitivity to paper tapes and some adhesives, recommend she remove before bedtime or sooner.    OBJECTIVE IMPAIRMENTS: decreased activity tolerance, decreased ROM, decreased strength, increased muscle spasms, impaired flexibility, impaired UE functional use, postural dysfunction, and pain.   ACTIVITY LIMITATIONS: carrying, lifting, sleeping, bathing, dressing, reach over head, and hygiene/grooming  PARTICIPATION LIMITATIONS: meal prep, cleaning, laundry, driving, shopping, occupation, and yard work  PERSONAL FACTORS:  Fitness and 3+ comorbidities: RA, CAD, HTN are also affecting patient's functional outcome.   REHAB POTENTIAL: Good  CLINICAL DECISION MAKING: Evolving/moderate complexity  EVALUATION COMPLEXITY: Moderate   GOALS: Goals reviewed with patient? Yes  SHORT TERM GOALS: Target date: 02/27/2024   Patient will be independent with initial HEP.  Baseline:  Goal status: Ongoing  2.  Patient able to sleep without waking from pain. Baseline:  Goal status: INITIAL  3.  Decreased pain by 25% with ADLs Baseline:  Goal status: INITIAL   LONG TERM GOALS: Target date: 03/26/2024   Patient will be independent  with advanced/ongoing HEP to improve outcomes and carryover.  Baseline:  Goal status: INITIAL  2.  Patient will report 85% improvement in L shoulder pain with ADLS to improve QOL.  Baseline:  Goal status: INITIAL  3.  Patient to improve L shoulder AROM to [facility] without pain provocation to perform OH activities.  Baseline:  Goal status: INITIAL  4.  Patient will be able to type for her job with 3/10 pain or less.  Baseline:  Goal status: INITIAL  5. Improved UEFS by 9 points showing functional improvement. Baseline: 20/80 Goal status: INITIAL    PLAN:  PT FREQUENCY: 2x/week  PT DURATION: 8 weeks  PLANNED INTERVENTIONS: 97164- PT Re-evaluation, 97110-Therapeutic exercises, 97530- Therapeutic activity, 97112- Neuromuscular re-education, 97535- Self Care, 02859- Manual therapy, G0283- Electrical stimulation (unattended), 97035- Ultrasound, 02966- Ionotophoresis 4mg /ml Dexamethasone , 79439 (1-2 muscles), 20561 (3+ muscles)- Dry Needling, Patient/Family education, Taping, Joint mobilization, Spinal mobilization, Cryotherapy, and Moist heat  PLAN FOR NEXT SESSION: assess response to KT tape and yellow band low rows and extensions; graded exposure to exercise, Left shoulder active assisted ROM, hold isometrics,  modalities for pain although pt has not had a good response to TENS in  the past   Glade Pesa, PT 02/09/24 8:07 PM Phone: [phone] Fax: [phone]  [facility] [address] [place], [place] [address] Phone # [phone] Fax [phone]

## 2024-02-13 ENCOUNTER — Other Ambulatory Visit (HOSPITAL_BASED_OUTPATIENT_CLINIC_OR_DEPARTMENT_OTHER): Payer: Self-pay

## 2024-02-13 ENCOUNTER — Other Ambulatory Visit (HOSPITAL_BASED_OUTPATIENT_CLINIC_OR_DEPARTMENT_OTHER): Payer: Self-pay | Admitting: Student

## 2024-02-13 ENCOUNTER — Ambulatory Visit (HOSPITAL_BASED_OUTPATIENT_CLINIC_OR_DEPARTMENT_OTHER): Admitting: Student

## 2024-02-13 ENCOUNTER — Ambulatory Visit: Payer: Self-pay

## 2024-02-13 ENCOUNTER — Encounter (HOSPITAL_BASED_OUTPATIENT_CLINIC_OR_DEPARTMENT_OTHER): Payer: Self-pay | Admitting: Student

## 2024-02-13 VITALS — BP 106/73 | HR 77 | Temp 98.0°F | Resp 16 | Ht 63.0 in | Wt 215.2 lb

## 2024-02-13 DIAGNOSIS — R42 Dizziness and giddiness: Secondary | ICD-10-CM | POA: Insufficient documentation

## 2024-02-13 DIAGNOSIS — Z1231 Encounter for screening mammogram for malignant neoplasm of breast: Secondary | ICD-10-CM

## 2024-02-13 DIAGNOSIS — R21 Rash and other nonspecific skin eruption: Secondary | ICD-10-CM | POA: Diagnosis not present

## 2024-02-13 DIAGNOSIS — E876 Hypokalemia: Secondary | ICD-10-CM | POA: Diagnosis not present

## 2024-02-13 NOTE — Assessment & Plan Note (Signed)
 Chronic dizziness exacerbated by heat and positional changes, previously leading to falls. Previous CT scans ruled out posterior circulation stroke and schwannoma. Symptoms include dizziness, headaches, sensitivity to light and sound, and occasional nausea.  Vestibular migraine is considered due to headache patterns alongside dizziness. BPPV is suspected due to positional nature of dizziness. Dix hallpike maneuver was performed in-office, inducing dizziness. Epley performed on both sides thereafter with possible mild benefit. Meclizine (Antivert) was discussed as a potential treatment, with caution advised due to possible interaction with fludrocortisone on allergy  list-directed to speak with pharmacist. - Instruct on Epley maneuver for home use - May consider referral to neurology if symptoms persist or worsen - Orthostatic vital signs (including pulse and blood pressure) were within normal ranges, I believe that POTS is unlikely. - Is possible that this could be multifactorial including BPPV, migrainous pathology, and/or chronically low potassium.

## 2024-02-13 NOTE — Patient Instructions (Signed)
 It was nice to see you today!  As we discussed in clinic:  For your vertigo, you can try the epley as needed at home.  If you have any problems before your next visit feel free to message me via MyChart (minor issues or questions) or call the office, otherwise you may reach out to schedule an office visit.  Thank you! Dierdra Salameh, PA-C

## 2024-02-13 NOTE — Assessment & Plan Note (Signed)
 She is on a potassium-sparing diuretic, likely amiloride, to manage potassium levels. Potassium levels are well-managed at 4.3. Regular monitoring is essential due to the risk of hypokalemia-related symptoms. - Continue regular monitoring of potassium levels - Ensure medication list is updated with current prescriptions    -Continue to follow with nephrology

## 2024-02-13 NOTE — Assessment & Plan Note (Signed)
 Recurrent skin rash with circular patches, possibly due to ringworm or nummular eczema. Previous treatment with steroid and antifungal creams was effective, suggesting ringworm. However, nummular eczema remains a differential diagnosis since there was steroid in the mix. - If rash recurs, treat with antifungal cream - If antifungal treatment is ineffective, consider treatment for nummular eczema

## 2024-02-13 NOTE — Progress Notes (Signed)
 Established Patient Office Visit  Subjective   Patient ID: Casey [name], female    DOB: 12-Dec-1970  Age: 53 y.o. MRN: [id number]  Chief Complaint  Patient presents with   Dizziness    Was profusely sweating taking out trash. Sat down in car for a bit. Got up, took 2 steps and went down. Hurt right elbow, right knee, and some bruising. Basically anytime she went outside, got dizzy or when bending down. Needs something down. Does not think it is vertigo. Concerned about POTS. Husband is mad no one is figuring it out.    Rash    Took ring worm cream that infectious disease gave her but it is popping all around stomach. Believes it might be eczema.    Follow-up    Has gone 2 months without meds for RA. RA provider she was seeing saw a previous provider's notes. Ordered an US  for hands and feet. Prednisone was recommenced, she is allergic. Offered shot, but it would interfere w/ US . It is scheduled for February 22, 2024. Doing PT for left shoulder. Has cataracts in both eyes. Has appt in August w/ [facility].    Discussed the use of AI scribe software for clinical note transcription with the patient, who gave verbal consent to proceed.  History of Present Illness   Casey [name] is a 54 year old female who presents with persistent dizziness and recent falls.  She has been experiencing persistent dizziness since February, which began with a fall. The dizziness is described as a sensation of 'swimming in her head' and is exacerbated by heat exposure or bending over. She has had several falls, the most recent two weeks ago, resulting in a knee injury. The dizziness occurs daily, often necessitating sitting to prevent falls.  The dizziness is sometimes accompanied by headaches resembling migraines, though less severe than her previous migraines. These headaches are associated with photophobia, phonophobia, and occasional nausea. The dizziness typically precedes the headaches.  She has a history of blood  pressure fluctuations, monitored by her nephrologist and cardiologist. Her blood pressure has remained below 125 mmHg recently, except during episodes of high pain. She is on a potassium-sparing diuretic, likely amiloride, to manage her potassium levels, which have been stable.  She reports a skin condition with circular patches on her stomach, initially thought to be ringworm. She has been using steroid and antifungal creams, which have improved the condition on her arms but not completely resolved the patches on her stomach. She has a history of candidiasis, which may predispose her to fungal infections.  She notes decreased appetite and weight loss associated with her dizziness. No ringing in the ears or recent upper respiratory infections.      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   Coronary artery disease s/p PCI/DES to LAD 07/01/2023 12/28/2022   Hyperlipidemia 10/07/2022   Hiatal hernia 10/07/2022   Esophageal candidiasis (HCC) 06/26/2022   Rheumatoid arthritis of multiple sites with 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 factor (HCC) 11/16/2021   De Quervain's tenosynovitis, left 07/31/2021   Status post placement of ureteral stent 02/06/2021   Proteinuria 08/12/2020   Environmental  allergies 08/12/2020   History of kidney stones 08/12/2020   Gastroesophageal reflux disease with esophagitis without hemorrhage 08/01/2019   Slow transit constipation 08/01/2019   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   History of colon polyps 06/19/2019   Irregular astigmatism of both eyes 10/11/2018   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   Myopia with astigmatism and presbyopia 05/07/2016   Overactive bladder 11/18/2015   Incontinence 10/07/2015   Essential hypertension 06/27/2015   Cataract 05/06/2015   Dry eyes 05/06/2015   Crohn's disease (HCC) 04/21/2015   Osteoarthritis of both knees 04/21/2015   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/30/2014   History of abnormal cervical Pap smear 11/29/2014   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 Atopic dermatitis Formatting of this note might be different from the original. Formatting of this note might be different from the original. Atopic dermatitis Formatting of this note might be different from the original. Formatting of this note might be different from the original. Formatting of this note might be different from the or   Cataract 05/06/2015   Contusion of left wrist 07/08/2021   Coronary artery disease s/p PCI/DES to LAD 07/01/2023 12/28/2022   Crohn's disease (HCC) 04/21/2015   De Quervain's tenosynovitis, left 07/31/2021   Diabetes mellitus without complication (HCC)    Diabetes type 2, controlled (HCC) 04/21/2015   Dry eyes 05/06/2015   DVT (deep venous thrombosis) (HCC)    Dysfunction of both eustachian tubes 08/31/2012   Encounter for monitoring immunomodulating therapy 11/20/2021   Environmental allergies 08/12/2020    Esophageal candidiasis (HCC) 06/26/2022   Essential hypertension 06/27/2015   Fibrositis 08/28/2014   Formatting of this note might be different from the original. Fibromyalgia Formatting of this note might be different from the original. Formatting of this note might be different from the original. Fibromyalgia   Flank pain 11/24/2020   Gastroesophageal reflux disease with esophagitis without hemorrhage 08/01/2019   Formatting of this note might be different from the original. Formatting of this note might be different from the original. 07/2019: chronic symptoms of esophageal reflux since prior to sleeve gastrectomy surgery. Symptoms have worsened over the last 6 months with recent EGD findings of grade B esophagitis, irregular z line, erythematous mucosa, and evidence of sleeve gastrectomy. 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 Formatting of this note might be different from the original. 07/2019: 8 mm colonic polyp removed during colonoscopy, biopsy showed serrated polyp. Repeat colonoscopy recommended in one year. Formatting of this note might be different from the original. Added automatically from request for surgery [id number] Formatting of this note might b   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  History of kidney stones 08/12/2020   Hypercholesterolemia 10/07/2022   Hypertension, essential, benign 06/27/2015   Hypertensive disorder 08/28/2014   Formatting of this note might be different from the original. Hypertension Formatting of this note might be different from the original. Formatting of this note might be different from the original. 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 Keratoconus Formatting of this note might be different from the original. Formatting of this note might be different from the original. Keratoconus Formatting of this note might be different from the original. Formatting of this note might be different from the original. Keratoconus Formatting of this note might be different from the or   Microhematuria 06/27/2015   Migraine 08/28/2014   Formatting of this note might be different from the original. Migraine Formatting of this note might be different from the original. Formatting of this note might be different from the original. Migraine Formatting of this note might be different from the original. Migraine Formatting of this note might be different from the original. Migraine Formatting of this note might be different from the or   Migraine without aura and without status migrainosus, not intractable 08/28/2014   Formatting of this note might be different from the original. Formatting of this note might be different from the original. Formatting of this note might be different from the original. Migraine Formatting of this note might be different from the original. Migraine Formatting of this note might be different from the original. Formatting of this note might be different from the original. Migraine F   Mild intermittent asthma without complication 10/30/2008   Formatting of this note might be different from the original. Formatting of this note might be different from the original. Formatting of this note might be different from the original. Asthma Formatting of this note might be different from the original. Asthma Formatting of this note might be different from the original. Formatting of this note might be different from the original. 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 Formatting of this note might be different from the original. 01/25/2016 with Dr. Loney Pol at [facility] [place] of this note might be different from the original. 01/25/2016 with Dr. Loney Pol at [facility] [place] of this note might be different from the original. Formatting of this note might be different from    Seronegative inflammatory arthritis 08/28/2014   Formatting of this note might be different from the original. Formatting of this note might be different from the original. Arthritis; diagnosed as IBD related - 2012 Formatting of this note might be different from the original. Followed by rheumatologist. Had to change providers due to an insurance change earlier this year resulting in patient not being  able to have Remicade  for ~6 months. 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 Formatting of this note might be different from the original. Treated with PRN miralax . Recent colonoscopy 07/2019. Formatting of this note might be different from the original. Treated with PRN miralax . Recent colonoscopy 07/2019. Formatting of this note might be different from the original. Formatting of this note might be different f   Status post placement of  ureteral stent 02/06/2021   Tooth infection 08/12/2020   Unstable angina (HCC) 07/29/2023   Ureteral stone 01/20/2022   Urinary tract infection, site not specified 06/26/2022   Social History   Tobacco Use   Smoking status: Never    Passive exposure: Never   Smokeless tobacco: Never  Vaping Use   Vaping status: Never Used  Substance Use Topics   Alcohol use: Yes    Comment: rare   Drug use: Never   Allergies  Allergen Reactions   Albuterol  Sulfate Shortness Of Breath    Other reaction(s): Other (See Comments)  can't breathe  Other reaction(s): Other (See Comments) can't breathe   Tape     Other reaction(s): Other (See Comments), Other (See Comments), Other (See Comments)  severe skin breakdown  Cast and Bandage Cover  severe skin breakdown  severe skin breakdown   Formoterol Other (See Comments)    Other reaction(s): Headaches  Other reaction(s): Headaches  Other reaction(s): Headaches  Headaches  Other reaction(s): Headaches Other reaction(s): Headaches    Other reaction(s): Headaches    Headaches   Hydrocodone  Rash   Hydrocodone -Acetaminophen  Rash   Nickel Rash and Other (See Comments)   Other Rash    cast and bandage use  cast and bandage use  cast and bandage use  cast and bandage use    cast and bandage use cast and bandage use   Oxycodone -Acetaminophen  Other (See Comments)    headache   Salmeterol Rash   Strawberry Extract Hives    Full body rash from strawberry   Advair Hfa [Fluticasone-Salmeterol]     unknown   Proventil  Hfa [Albuterol ]     unknown   Asa [Aspirin ] Rash   Cleocin [Clindamycin] Rash   Clindamycin/Lincomycin Rash   Fludrocortisone Acetate Rash   Gabapentin Rash   Hydrochlorothiazide Rash   Imipramine Rash   Ipratropium Bromide Rash   Medroxyprogesterone Rash   Meloxicam Rash   Metformin And Related Rash   Nystatin Rash   Penicillins Rash   Pred Forte [Prednisolone] Rash   Prednisone Rash   Solu-Medrol   [Methylprednisolone ] Rash   Sulfa Antibiotics Rash   Tegaderm Ag Mesh [Silver] Rash   Toradol  [Ketorolac  Tromethamine ] Rash   Tramadol Rash      Review of Systems  Neurological:  Positive for dizziness.   Per HPI.    Objective:     BP 106/73 (BP Location: Right Arm, Patient Position: Supine, Cuff Size: Normal)   Pulse 77   Temp 98 F (36.7 C) (Oral)   Resp 16   Ht 5' 3 (1.6 m)   Wt 215 lb 3.2 oz (97.6 kg)   SpO2 98%   BMI 38.12 kg/m  BP Readings from Last 3 Encounters:  02/13/24 106/73  01/28/24 124/82  01/07/24 133/81   Wt Readings from Last 3 Encounters:  02/13/24 215 lb 3.2 oz (97.6 kg)  12/14/23 220 lb 8 oz (100 kg)  12/06/23 218 lb (98.9 kg)  SpO2 Readings from Last 3 Encounters:  02/13/24 98%  01/28/24 98%  01/07/24 92%      Physical Exam Constitutional:      General: She is not in acute distress.    Appearance: Normal appearance. She is not ill-appearing.  HENT:     Head: Normocephalic and atraumatic.     Nose: Nose normal.  Eyes:     General: No scleral icterus.    Conjunctiva/sclera: Conjunctivae normal.     Comments: Did not spot any major nystagmus upon examination.  Cardiovascular:     Rate and Rhythm: Normal rate and regular rhythm.     Heart sounds: Normal heart sounds. No murmur heard.    No friction rub.  Pulmonary:     Effort: Pulmonary effort is normal. No respiratory distress.     Breath sounds: Normal breath sounds. No wheezing, rhonchi or rales.  Musculoskeletal:        General: Normal range of motion.  Skin:    General: Skin is warm and dry.     Coloration: Skin is not jaundiced or pale.  Neurological:     General: No focal deficit present.     Mental Status: She is alert.  Psychiatric:        Mood and Affect: Mood normal.        Behavior: Behavior normal.      No results found for any visits on 02/13/24.  Last CBC Lab Results  Component Value Date   WBC 10.4 01/07/2024   HGB 10.7 (L) 01/07/2024   HCT 34.8 (L)  01/07/2024   MCV 79.5 (L) 01/07/2024   MCH 24.4 (L) 01/07/2024   RDW 16.8 (H) 01/07/2024   PLT 388 01/07/2024   Last metabolic panel Lab Results  Component Value Date   GLUCOSE 111 (H) 01/07/2024   NA 141 01/07/2024   K 3.1 (L) 01/07/2024   CL 105 01/07/2024   CO2 24 01/07/2024   BUN 9 01/07/2024   CREATININE 1.14 (H) 01/07/2024   GFRNONAA 57 (L) 01/07/2024   CALCIUM  9.6 01/07/2024   PROT 8.0 01/07/2024   ALBUMIN 3.5 01/07/2024   BILITOT 0.5 01/07/2024   ALKPHOS 87 01/07/2024   AST 27 01/07/2024   ALT 18 01/07/2024   ANIONGAP 12 01/07/2024   Last lipids Lab Results  Component Value Date   CHOL 249 (H) 09/06/2023   HDL 73 09/06/2023   LDLCALC 140 (H) 09/06/2023   TRIG 205 (H) 09/06/2023   CHOLHDL 3.4 09/06/2023   Last hemoglobin A1c Lab Results  Component Value Date   HGBA1C 6.0 (H) 09/06/2023   Last thyroid  functions Lab Results  Component Value Date   TSH 0.983 11/04/2023      The 10-year ASCVD risk score (Arnett DK, et al., 2019) is: 3.1%    Assessment & Plan:   Dizziness Assessment & Plan: Chronic dizziness exacerbated by heat and positional changes, previously leading to falls. Previous CT scans ruled out posterior circulation stroke and schwannoma. Symptoms include dizziness, headaches, sensitivity to light and sound, and occasional nausea.  Vestibular migraine is considered due to headache patterns alongside dizziness. BPPV is suspected due to positional nature of dizziness. Dix hallpike maneuver was performed in-office, inducing dizziness. Epley performed on both sides thereafter with possible mild benefit. Meclizine (Antivert) was discussed as a potential treatment, with caution advised due to possible interaction with fludrocortisone on allergy  list-directed to speak with pharmacist. GLENWOOD Fredericks on Epley maneuver for home use - May consider referral to neurology if  symptoms persist or worsen - Orthostatic vital signs (including pulse and blood  pressure) were within normal ranges, I believe that POTS is unlikely. - Is possible that this could be multifactorial including BPPV, migrainous pathology, and/or chronically low potassium.   Skin rash Assessment & Plan: Recurrent skin rash with circular patches, possibly due to ringworm or nummular eczema. Previous treatment with steroid and antifungal creams was effective, suggesting ringworm. However, nummular eczema remains a differential diagnosis since there was steroid in the mix. - If rash recurs, treat with antifungal cream - If antifungal treatment is ineffective, consider treatment for nummular eczema   Hypokalemia Assessment & Plan: Recurrent issue.  She is on a potassium-sparing diuretic, likely amiloride, to manage potassium levels. Potassium levels are well-managed at 4.3. Regular monitoring is essential due to the risk of hypokalemia-related symptoms. - Continue regular monitoring of potassium levels - Ensure medication list is updated with current prescriptions    -Continue to follow with nephrology    Return in about 3 months (around 05/15/2024), or if symptoms worsen or fail to improve, for Chronic Followup.   I personally spent a total of 26 minutes in the care of the patient today including performing a medically appropriate exam/evaluation, counseling and educating, and independently interpreting results.   Nala Kachel T Sherolyn Trettin, PA-C

## 2024-02-13 NOTE — Assessment & Plan Note (Signed)
 Recurrent issue.  She is on a potassium-sparing diuretic, likely amiloride, to manage potassium levels. Potassium levels are well-managed at 4.3. Regular monitoring is essential due to the risk of hypokalemia-related symptoms. - Continue regular monitoring of potassium levels - Ensure medication list is updated with current prescriptions    -Continue to follow with nephrology

## 2024-02-13 NOTE — Telephone Encounter (Signed)
 Pt was returning call as she states that she was asked to call clinic back. No note in chart at time of NT call as to why the pt was contacted. Pt would also like PCP to know that she would like a referral for neuro as well as the medication that her kidney doctor rx's is amiloride.

## 2024-02-14 ENCOUNTER — Ambulatory Visit: Admitting: Physical Therapy

## 2024-02-14 ENCOUNTER — Ambulatory Visit: Payer: Self-pay

## 2024-02-14 ENCOUNTER — Other Ambulatory Visit (HOSPITAL_BASED_OUTPATIENT_CLINIC_OR_DEPARTMENT_OTHER): Payer: Self-pay | Admitting: Student

## 2024-02-14 DIAGNOSIS — R42 Dizziness and giddiness: Secondary | ICD-10-CM

## 2024-02-14 NOTE — Telephone Encounter (Signed)
 FYI Only or Action Required?: Action required by provider: States not feeling better and would like a referral to neurology.  Denies any  new symptoms. Would like a call back from PCP. Patient was last seen in primary care on 07/18/2023 by Kennyth Domino, FNP.  Called Nurse Triage reporting Advice Only.  Symptoms began today.  Interventions attempted: Nothing.  Symptoms are: unchanged.  Triage Disposition: No disposition on file.  Patient/caregiver understands and will follow disposition?:      Copied from CRM [id number]. Topic: Clinical - Red Word Triage >> Feb 14, 2024 12:26 PM Selinda RAMAN wrote: Red Word that prompted transfer to Nurse Triage: The patient called in stating she was seen yesterday for headaches and dizziness but states she still is not any better. She wants to proceed with a referral to Neurology but I told her with her symptoms I need to get a nurse to triage her first. I will transfer her to [facility] NT Answer Assessment - Initial Assessment Questions N/A Would like referral to neurology and medication change.  States cannot take what was ordered.  Protocols used: No Contact or Duplicate Contact Call-A-AH

## 2024-02-15 ENCOUNTER — Encounter (INDEPENDENT_AMBULATORY_CARE_PROVIDER_SITE_OTHER): Payer: Self-pay

## 2024-02-15 ENCOUNTER — Ambulatory Visit (INDEPENDENT_AMBULATORY_CARE_PROVIDER_SITE_OTHER)

## 2024-02-15 DIAGNOSIS — J309 Allergic rhinitis, unspecified: Secondary | ICD-10-CM

## 2024-02-17 ENCOUNTER — Encounter: Payer: Self-pay | Admitting: Rehabilitative and Restorative Service Providers"

## 2024-02-19 ENCOUNTER — Emergency Department (HOSPITAL_BASED_OUTPATIENT_CLINIC_OR_DEPARTMENT_OTHER)
Admission: EM | Admit: 2024-02-19 | Discharge: 2024-02-19 | Disposition: A | Discharge status: Home / Self Care | Attending: Emergency Medicine | Admitting: Emergency Medicine

## 2024-02-19 ENCOUNTER — Emergency Department (HOSPITAL_BASED_OUTPATIENT_CLINIC_OR_DEPARTMENT_OTHER)

## 2024-02-19 ENCOUNTER — Encounter (HOSPITAL_BASED_OUTPATIENT_CLINIC_OR_DEPARTMENT_OTHER): Payer: Self-pay

## 2024-02-19 DIAGNOSIS — G43101 Migraine with aura, not intractable, with status migrainosus: Secondary | ICD-10-CM | POA: Diagnosis not present

## 2024-02-19 DIAGNOSIS — E860 Dehydration: Secondary | ICD-10-CM | POA: Diagnosis not present

## 2024-02-19 DIAGNOSIS — R519 Headache, unspecified: Secondary | ICD-10-CM | POA: Diagnosis present

## 2024-02-19 DIAGNOSIS — R7989 Other specified abnormal findings of blood chemistry: Secondary | ICD-10-CM

## 2024-02-19 LAB — BASIC METABOLIC PANEL WITH GFR
Anion gap: 10 (ref 5–15)
BUN: 19 mg/dL (ref 6–20)
CO2: 24 mmol/L (ref 22–32)
Calcium: 10 mg/dL (ref 8.9–10.3)
Chloride: 102 mmol/L (ref 98–111)
Creatinine, Ser: 1.09 mg/dL — ABNORMAL HIGH (ref 0.44–1.00)
GFR, Estimated: >60 mL/min (ref >60)
Glucose, Bld: 114 mg/dL — ABNORMAL HIGH (ref 70–99)
Potassium: 4.1 mmol/L (ref 3.5–5.1)
Sodium: 137 mmol/L (ref 135–145)

## 2024-02-19 LAB — CBC WITH DIFFERENTIAL/PLATELET
Abs Immature Granulocytes: 0.02 K/uL (ref 0.00–0.07)
Basophils Absolute: 0.1 K/uL (ref 0.0–0.1)
Basophils Relative: 1 %
Eosinophils Absolute: 0.2 K/uL (ref 0.0–0.5)
Eosinophils Relative: 3 %
HCT: 32.9 % — ABNORMAL LOW (ref 36.0–46.0)
Hemoglobin: 10.4 g/dL — ABNORMAL LOW (ref 12.0–15.0)
Immature Granulocytes: 0 %
Lymphocytes Relative: 29 %
Lymphs Abs: 2.4 K/uL (ref 0.7–4.0)
MCH: 24.7 pg — ABNORMAL LOW (ref 26.0–34.0)
MCHC: 31.6 g/dL (ref 30.0–36.0)
MCV: 78.1 fL — ABNORMAL LOW (ref 80.0–100.0)
Monocytes Absolute: 0.7 K/uL (ref 0.1–1.0)
Monocytes Relative: 8 %
Neutro Abs: 4.8 K/uL (ref 1.7–7.7)
Neutrophils Relative %: 59 %
Platelets: 352 K/uL (ref 150–400)
RBC: 4.21 MIL/uL (ref 3.87–5.11)
RDW: 17.9 % — ABNORMAL HIGH (ref 11.5–15.5)
WBC: 8.1 K/uL (ref 4.0–10.5)
nRBC: 0 % (ref 0.0–0.2)

## 2024-02-19 LAB — MAGNESIUM: Magnesium: 1.9 mg/dL (ref 1.7–2.4)

## 2024-02-19 MED ORDER — PROCHLORPERAZINE EDISYLATE 10 MG/2ML IJ SOLN
10.0000 mg | Freq: Once | INTRAMUSCULAR | Status: AC
  Administered 2024-02-19: 10 mg via INTRAVENOUS
  Filled 2024-02-19: qty 2

## 2024-02-19 MED ORDER — SODIUM CHLORIDE 0.9 % IV BOLUS
1000.0000 mL | Freq: Once | INTRAVENOUS | Status: AC
  Administered 2024-02-19: 1000 mL via INTRAVENOUS

## 2024-02-19 MED ORDER — DIPHENHYDRAMINE HCL 50 MG/ML IJ SOLN
50.0000 mg | Freq: Once | INTRAMUSCULAR | Status: AC
  Administered 2024-02-19: 50 mg via INTRAVENOUS
  Filled 2024-02-19: qty 1

## 2024-02-19 NOTE — ED Triage Notes (Signed)
 He c/o generalized h/a with occasional, mild dizzy spells x 2-3 days. She denies fever, and is in no distress

## 2024-02-19 NOTE — Discharge Instructions (Addendum)
 The CT scan your head did not show any abnormalities, no bleeding, no swelling. For severe pain use Tylenol  and ibuprofen every 6 hours as needed. For nausea or migraine-like headache you can try Zofran  every 6 hours as needed. Return for strokelike symptoms, persistent fevers or new concerns. Follow-up with primary doctor and neurology.

## 2024-02-19 NOTE — ED Provider Notes (Signed)
 Casey [name]   CSN: [id number] Arrival date & time: 02/19/24  9262     Patient presents with: Headache   Casey [name] is a 53 y.o. female.   Patient presents with generalized intermittent headache and dizzy spells for 2 to 3 days.  Patient's had this in the past with dehydration and low potassium.  Patient is followed for mild chronic renal disease.  Patient denies any stroke symptoms or unilateral findings.  No fevers.  No head injuries.  Transient blurry vision lightheadedness.  No focal visual deficits or double vision.  The history is provided by the patient.  Headache Associated symptoms: no abdominal pain, no back pain, no congestion, no fever, no neck pain, no neck stiffness, no numbness, no vomiting and no weakness        Prior to Admission medications   Medication Sig Start Date End Date Taking? Authorizing Provider  acetaminophen  (TYLENOL ) 500 MG tablet Take 1,000 mg by mouth every 12 (twelve) hours as needed for mild pain or moderate pain.    [provider]  albuterol  (PROVENTIL ) (2.5 MG/3ML) 0.083% nebulizer solution Take 3 mLs (2.5 mg total) by nebulization every 6 (six) hours as needed for wheezing or shortness of breath. 10/13/23   Alva Larraine FALCON, PA-C  atorvastatin  (LIPITOR) 40 MG tablet Take 2 tablets (80 mg total) by mouth daily. 01/19/24   Tolia, Sunit, DO  B Complex-C (B-COMPLEX WITH VITAMIN C) tablet Take 1 tablet by mouth daily. 09/06/19   [provider]  Cholecalciferol  (VITAMIN D3) 50 MCG (2000 UT) TABS Take 1 tablet by mouth daily.    [provider]  EPINEPHrine  (EPIPEN  2-PAK) 0.3 mg/0.3 mL IJ SOAJ injection Inject 0.3 mg into the muscle as needed for anaphylaxis. 01/18/23   Jeneal Danita Macintosh, MD  Evolocumab  (REPATHA  SURECLICK) 140 MG/ML SOAJ Inject 140 mg into the skin every 14 (fourteen) days. 12/27/23   Tolia, Sunit, DO  famotidine  (PEPCID ) 40 MG tablet Take 40 mg by  mouth 2 (two) times daily. 12/20/23 12/19/24  [provider]  levocetirizine (XYZAL) 5 MG tablet Take 10 mg by mouth daily.    [provider]  losartan (COZAAR) 100 MG tablet Take 100 mg by mouth daily. 12/08/23 12/07/24  [provider]  lubiprostone (AMITIZA) 8 MCG capsule Take 16 mcg by mouth daily with breakfast. 10/07/23 04/04/24  [provider]  mirabegron  ER (MYRBETRIQ ) 50 MG TB24 tablet Take 1 tablet (50 mg total) by mouth daily. 11/30/22   Jason Leita Repine, FNP  montelukast  (SINGULAIR ) 10 MG tablet Take 1 tablet (10 mg total) by mouth at bedtime. Patient taking differently: Take 10 mg by mouth daily. 11/30/22   Jason Leita Repine, FNP  nitroGLYCERIN  (NITROSTAT ) 0.4 MG SL tablet Place 1 tablet (0.4 mg total) under the tongue every 5 (five) minutes as needed for chest pain. 12/13/23   Tolia, Sunit, DO  oxycodone  (OXY-IR) 5 MG capsule Take 1 capsule (5 mg total) by mouth every 6 (six) hours as needed. 01/16/24   Zamora, Erin R, NP  RABEprazole (ACIPHEX) 20 MG tablet Take 20 mg by mouth 2 (two) times daily.    [provider]  ticagrelor  (BRILINTA ) 90 MG TABS tablet Take 1 tablet (90 mg total) by mouth 2 (two) times daily. 01/12/24   Tolia, Sunit, DO    Allergies: Albuterol  sulfate, Tape, Formoterol, Hydrocodone , Hydrocodone -acetaminophen , Nickel, Other, Oxycodone -acetaminophen , Salmeterol, Strawberry extract, Advair hfa [fluticasone-salmeterol], Proventil  hfa [albuterol ], Asa [aspirin ], Cleocin [  clindamycin], Clindamycin/lincomycin, Fludrocortisone acetate, Gabapentin, Hydrochlorothiazide, Imipramine, Ipratropium bromide, Medroxyprogesterone, Meloxicam, Metformin and related, Nystatin, Penicillins, Pred forte [prednisolone], Prednisone, Solu-medrol  [methylprednisolone ], Sulfa antibiotics, Tegaderm ag mesh [silver], Toradol  [ketorolac  tromethamine ], and Tramadol    Review of Systems  Constitutional:  Negative for chills and fever.  HENT:  Negative for  congestion.   Eyes:  Positive for visual disturbance.  Respiratory:  Negative for shortness of breath.   Cardiovascular:  Negative for chest pain.  Gastrointestinal:  Negative for abdominal pain and vomiting.  Genitourinary:  Negative for dysuria and flank pain.  Musculoskeletal:  Negative for back pain, neck pain and neck stiffness.  Skin:  Negative for rash.  Neurological:  Positive for light-headedness and headaches. Negative for weakness and numbness.    Updated Vital Signs BP (!) 111/54   Pulse 79   Temp 97.8 F (36.6 C) (Oral)   Resp 16   SpO2 100%   Physical Exam Vitals and nursing [name] reviewed.  Constitutional:      General: She is not in acute distress.    Appearance: She is well-developed.  HENT:     Head: Normocephalic and atraumatic.     Comments: Mild dry mucous membrane Eyes:     General:        Right eye: No discharge.        Left eye: No discharge.     Conjunctiva/sclera: Conjunctivae normal.  Neck:     Trachea: No tracheal deviation.  Cardiovascular:     Rate and Rhythm: Normal rate and regular rhythm.     Heart sounds: No murmur heard. Pulmonary:     Effort: Pulmonary effort is normal.     Breath sounds: Normal breath sounds.  Abdominal:     General: There is no distension.     Palpations: Abdomen is soft.     Tenderness: There is no abdominal tenderness. There is no guarding.  Musculoskeletal:     Cervical back: Normal range of motion and neck supple. No rigidity.  Skin:    General: Skin is warm.     Capillary Refill: Capillary refill takes less than 2 seconds.     Findings: No rash.  Neurological:     General: No focal deficit present.     Mental Status: She is alert.     GCS: GCS eye subscore is 4. GCS verbal subscore is 5. GCS motor subscore is 6.     Cranial Nerves: No cranial nerve deficit.     Sensory: No sensory deficit.     Motor: No weakness.     Coordination: Coordination normal.  Psychiatric:        Mood and Affect: Mood  normal.     (all labs ordered are listed, but only abnormal results are displayed) Labs Reviewed  BASIC METABOLIC PANEL WITH GFR - Abnormal; Notable for the following components:      Result Value   Glucose, Bld 114 (*)    Creatinine, Ser 1.09 (*)    All other components within normal limits  CBC WITH DIFFERENTIAL/PLATELET - Abnormal; Notable for the following components:   Hemoglobin 10.4 (*)    HCT 32.9 (*)    MCV 78.1 (*)    MCH 24.7 (*)    RDW 17.9 (*)    All other components within normal limits  MAGNESIUM     EKG: EKG Interpretation Date/Time:  Sunday February 19 2024 08:39:10 EDT Ventricular Rate:  81 PR Interval:  184 QRS Duration:  103 QT Interval:  390 QTC Calculation:  453 R Axis:   -14  Text Interpretation: Sinus rhythm Low voltage, precordial leads Confirmed by Tonia Chew [phone]) on 02/19/2024 10:25:58 AM  Radiology: CT Head Wo Contrast Result Date: 02/19/2024 EXAM: CT HEAD WITHOUT 02/19/2024 10:40:00 AM TECHNIQUE: CT of the head was performed without the administration of intravenous contrast. Automated exposure control, iterative reconstruction, and/or weight based adjustment of the mA/kV was utilized to reduce the radiation dose to as low as reasonably achievable. COMPARISON: MR head without contrast 11/11/2023. CT head without contrast 10/10/2023. CLINICAL HISTORY: Headache and dizziness onset yesterday. FINDINGS: BRAIN AND VENTRICLES: No acute intracranial hemorrhage. No mass effect or midline shift. No extra-axial fluid collection. Gray-white differentiation is maintained. No hydrocephalus. ORBITS: No acute abnormality. SINUSES AND MASTOIDS: No acute abnormality. SOFT TISSUES AND SKULL: No acute skull fracture. No acute soft tissue abnormality. IMPRESSION: 1. No acute intracranial abnormality. Electronically signed by: Lonni Necessary MD 02/19/2024 11:10 AM EDT RP Workstation: [id number]     Procedures   Medications Ordered in the ED  sodium chloride  0.9  % bolus 1,000 mL (0 mLs Intravenous Stopped 02/19/24 1007)  prochlorperazine  (COMPAZINE ) injection 10 mg (10 mg Intravenous Given 02/19/24 0906)  diphenhydrAMINE  (BENADRYL ) injection 50 mg (50 mg Intravenous Given 02/19/24 0906)                                    Medical Decision Making Amount and/or Complexity of Data Reviewed Labs: ordered. Radiology: ordered. ECG/medicine tests: ordered.  Risk Prescription drug management.  Patient presents for recurrent headache for almost 3 weeks.  Patient is on anticoagulant due to blood clot/stent history.  Normal neurologic exam no signs of significant infection.  Due to patient being on blood thinners CT scan of the head ordered to look for occult hemorrhage results and plan reviewed negative.  Patient improved with IV fluid bolus and migraine medicines.  Patient has outpatient follow-up and neurology information given as well.  Blood work independently reviewed creatinine mild elevated 1.09 compared to previous, mild anemia 10.4.  Patient stable for discharge.       Final diagnoses:  Migraine with aura and with status migrainosus, not intractable  Elevated serum creatinine  Dehydration    ED Discharge Orders     None          Tonia Chew, MD 02/19/24 1157

## 2024-02-20 ENCOUNTER — Inpatient Hospital Stay (HOSPITAL_BASED_OUTPATIENT_CLINIC_OR_DEPARTMENT_OTHER): Admission: RE | Admit: 2024-02-20 | Source: Ambulatory Visit | Admitting: Radiology

## 2024-02-21 ENCOUNTER — Encounter (HOSPITAL_BASED_OUTPATIENT_CLINIC_OR_DEPARTMENT_OTHER): Payer: Self-pay | Admitting: Student

## 2024-02-21 ENCOUNTER — Ambulatory Visit (INDEPENDENT_AMBULATORY_CARE_PROVIDER_SITE_OTHER): Admitting: *Deleted

## 2024-02-21 ENCOUNTER — Encounter: Admitting: Rehabilitative and Restorative Service Providers"

## 2024-02-21 ENCOUNTER — Other Ambulatory Visit: Payer: Self-pay | Admitting: Medical Genetics

## 2024-02-21 DIAGNOSIS — J309 Allergic rhinitis, unspecified: Secondary | ICD-10-CM | POA: Diagnosis not present

## 2024-02-22 ENCOUNTER — Ambulatory Visit: Admitting: Family

## 2024-02-22 ENCOUNTER — Encounter: Payer: Self-pay | Admitting: Physician Assistant

## 2024-02-22 ENCOUNTER — Ambulatory Visit (INDEPENDENT_AMBULATORY_CARE_PROVIDER_SITE_OTHER): Admitting: Physician Assistant

## 2024-02-22 DIAGNOSIS — M79671 Pain in right foot: Secondary | ICD-10-CM

## 2024-02-22 DIAGNOSIS — M75112 Incomplete rotator cuff tear or rupture of left shoulder, not specified as traumatic: Secondary | ICD-10-CM

## 2024-02-22 DIAGNOSIS — M7751 Other enthesopathy of right foot: Secondary | ICD-10-CM

## 2024-02-22 MED ORDER — OXYCODONE-ACETAMINOPHEN 5-325 MG PO TABS
1.0000 | ORAL_TABLET | Freq: Four times a day (QID) | ORAL | 0 refills | Status: DC | PRN
Start: 2024-02-22 — End: 2024-02-29

## 2024-02-22 NOTE — Progress Notes (Signed)
 Office Visit Note   Patient: Casey [name]           Date of Birth: July 04, 1971           MRN: [id number] Visit Date: 02/22/2024              Requested by: Iven Lang DASEN, PA-C [address] [place],  [place] [address] PCP: Iven Lang DASEN, PA-C  No chief complaint on file.     HPI: The patient is a 53 year old woman seen today for evaluation of left shoulder.  She is currently in PT and seems to slowly be improving her ROM.  She states the left shoulder injection did seem to help on her last visit.  She thinks she hit her right foot against the wall in her sleep and now has right GT pain with erythema and edema.  She is not sure but she does have a history of gout.  I gave her a work note to restrict her to 25 hours / wk at the computer.  She will be more productive with a dictation system.    Assessment & Plan: Visit Diagnoses:  1. Nontraumatic incomplete tear of left rotator cuff   2. Pain in right foot   3. Capsulitis of metatarsophalangeal (MTP) joint of right foot     Plan: Continue PT, home exercises to follow.  Refill pain medication #30 oxycodone .  We sent a referral for pain management.  Blood work for gout test was done today.  We will f/u.  Follow-Up Instructions: Return if symptoms worsen or fail to improve.   Ortho Exam  Patient is alert, oriented, no adenopathy, well-dressed, normal affect, normal respiratory effort.  Limited left shoulder motion ext/internal.  Tenderness to palpation right MTP with minimal erythema and edema.  ROM is full with pain at end points.  Palpable DP pulses equal B LE.       Imaging: No results found. No images are attached to the encounter.  Labs: Lab Results  Component Value Date   HGBA1C 6.0 (H) 09/06/2023   HGBA1C 5.8 11/30/2022   ESRSEDRATE 46 (H) 10/24/2023   ESRSEDRATE 40 (H) 07/31/2023   CRP 0.8 07/31/2023   REPTSTATUS 11/16/2023 FINAL 11/10/2023   REPTSTATUS 11/16/2023 FINAL 11/10/2023   CULT  11/10/2023    NO  GROWTH 5 DAYS Performed at [facility], [address] [place], [place] [address]    CULT  11/10/2023    NO GROWTH 5 DAYS Performed at [facility], [address], [place], [place] [address]    LABORGA ESCHERICHIA COLI (A) 07/30/2023     Lab Results  Component Value Date   ALBUMIN 3.5 01/07/2024   ALBUMIN 4.2 11/30/2023   ALBUMIN 4.3 11/19/2023    Lab Results  Component Value Date   MG 1.9 02/19/2024   MG 1.9 01/07/2024   MG 2.0 11/30/2023   No results found for: VD25OH  No results found for: PREALBUMIN    Latest Ref Rng & Units 02/19/2024    9:11 AM 01/07/2024   10:04 AM 11/30/2023    5:59 PM  CBC EXTENDED  WBC 4.0 - 10.5 K/uL 8.1  10.4  12.0   RBC 3.87 - 5.11 MIL/uL 4.21  4.38  4.30   Hemoglobin 12.0 - 15.0 g/dL 89.5  89.2  89.1   HCT 36.0 - 46.0 % 32.9  34.8  33.9   Platelets 150 - 400 K/uL 352  388  379   NEUT# 1.7 -  7.7 K/uL 4.8     Lymph# 0.7 - 4.0 K/uL 2.4        There is no height or weight on file to calculate BMI.  Orders:  Orders Placed This Encounter  Procedures   Uric acid   Ambulatory referral to Pain Clinic   Meds ordered this encounter  Medications   oxyCODONE -acetaminophen  (PERCOCET/ROXICET) 5-325 MG tablet    Sig: Take 1 tablet by mouth every 6 (six) hours as needed.    Dispense:  30 tablet    Refill:  0    Supervising Provider:   DUDA, MARCUS V [[id number]]     Procedures: No procedures performed  Clinical Data: No additional findings.  ROS:  All other systems negative, except as noted in the HPI. Review of Systems  Objective: Vital Signs: There were no vitals taken for this visit.  Specialty Comments:  No specialty comments available.  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   Coronary artery disease s/p PCI/DES to LAD 07/01/2023 12/28/2022   Hyperlipidemia 10/07/2022   Hiatal hernia 10/07/2022   Esophageal candidiasis (HCC) 06/26/2022   Rheumatoid arthritis of multiple sites with 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 factor (HCC) 11/16/2021   De Quervain's tenosynovitis, left 07/31/2021   Status post placement of ureteral stent 02/06/2021   Proteinuria 08/12/2020   Environmental allergies 08/12/2020   History of kidney stones 08/12/2020   Gastroesophageal reflux disease with esophagitis without hemorrhage 08/01/2019   Slow transit constipation 08/01/2019   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   History of colon polyps 06/19/2019   Irregular astigmatism of both eyes 10/11/2018   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   Myopia with astigmatism and presbyopia 05/07/2016   Overactive bladder 11/18/2015   Incontinence 10/07/2015   Essential hypertension 06/27/2015   Cataract 05/06/2015   Dry eyes 05/06/2015   Crohn's disease (HCC) 04/21/2015   Osteoarthritis of both knees 04/21/2015   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/30/2014   History of abnormal cervical Pap smear 11/29/2014   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 Atopic dermatitis Formatting of this note might be different from the original. Formatting of this note might be different from the original. Atopic dermatitis Formatting of this note might be different from the original. Formatting of this note might be different from the original. Formatting of this note might be different from the or   Cataract 05/06/2015   Contusion of left wrist 07/08/2021   Coronary artery disease s/p PCI/DES to LAD 07/01/2023 12/28/2022   Crohn's disease (HCC) 04/21/2015  De Quervain's tenosynovitis, left 07/31/2021   Diabetes mellitus without complication (HCC)    Diabetes type 2, controlled (HCC) 04/21/2015   Dry eyes 05/06/2015   DVT (deep venous thrombosis) (HCC)    Dysfunction of both eustachian tubes 08/31/2012   Encounter for monitoring immunomodulating therapy 11/20/2021   Environmental allergies 08/12/2020   Esophageal candidiasis (HCC) 06/26/2022   Essential hypertension 06/27/2015   Fibrositis 08/28/2014   Formatting of this note might be different from the original. Fibromyalgia Formatting of this note might be different from the original. Formatting of this note might be different from the original. Fibromyalgia   Flank pain 11/24/2020   Gastroesophageal reflux disease with esophagitis without hemorrhage 08/01/2019   Formatting of this note might be different from the original. Formatting of this note might be different from the original. 07/2019: chronic symptoms of esophageal reflux since prior to sleeve gastrectomy surgery. Symptoms have worsened over the last 6 months with recent EGD findings of grade B esophagitis, irregular z line, erythematous mucosa, and evidence of sleeve gastrectomy. 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 Formatting of this note might be different from the original. 07/2019: 8 mm colonic polyp removed during colonoscopy, biopsy showed serrated polyp. Repeat colonoscopy recommended in one year. Formatting of this note might be different from the original. 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 Hypertension Formatting of this note might be different from the original. Formatting of this note might be different from the original. 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 Keratoconus Formatting of this note might be different from the original. Formatting of this note might be different from the original. Keratoconus Formatting of this note might be different from the original. Formatting of this note might be different from the original. Keratoconus Formatting of this note might be different from the or   Microhematuria 06/27/2015   Migraine 08/28/2014   Formatting of this note might be different from the original. Migraine Formatting of this note might be different from the original. Formatting of this note might be different from the original. Migraine Formatting of this note might be different from the original. Migraine Formatting of this note might be different from the original. Migraine Formatting of this note might be different from the or   Migraine without aura and without status migrainosus, not intractable 08/28/2014   Formatting of  this note might be different from the original. Formatting of this note might be different from the original. Formatting of this note might be different from the original. Migraine Formatting of this note might be different from the original. Migraine Formatting of this note might be different from the original. Formatting of this note might be different from the original. Migraine F   Mild intermittent asthma without complication 10/30/2008   Formatting of this note  might be different from the original. Formatting of this note might be different from the original. Formatting of this note might be different from the original. Asthma Formatting of this note might be different from the original. Asthma Formatting of this note might be different from the original. Formatting of this note might be different from the original. 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 Formatting of this note might be different from the original. 01/25/2016 with Dr. Loney Pol at [facility]. [place] of this note might be different from the original. 01/25/2016 with Dr. Loney Pol at [facility]. [place] of this note might be different from the original. Formatting of this note might be different from    Seronegative inflammatory arthritis 08/28/2014    Formatting of this note might be different from the original. Formatting of this note might be different from the original. Arthritis; diagnosed as IBD related - 2012 Formatting of this note might be different from the original. Followed by rheumatologist. Had to change providers due to an insurance change earlier this year resulting in patient not being able to have Remicade  for ~6 months. 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 Formatting of this note might be different from the original. Treated with PRN miralax . Recent colonoscopy 07/2019. Formatting of this note might be different from the original. Treated with PRN miralax . Recent colonoscopy 07/2019. Formatting of this note might be different from the original. Formatting of this note might be different f   Status post placement of ureteral stent 02/06/2021   Tooth infection 08/12/2020   Unstable angina (HCC) 07/29/2023   Ureteral stone 01/20/2022   Urinary tract infection, site not specified 06/26/2022    Family History  Adopted: Yes  Problem Relation Age of Onset   Diabetes Mother    Hypertension Mother    Diabetes Father    Hypertension Father    Hypertension Sister    Diabetes Sister    Hypertension Brother    Diabetes Brother    Diabetes Paternal Grandmother    Cancer Other    Asthma Neg Hx    Allergic rhinitis Neg Hx    Atopy Neg Hx    Eczema Neg Hx     Past Surgical History:  Procedure Laterality Date   BACK SURGERY     L5-S1   CERVICAL CONE BIOPSY  04/2000   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   CORONARY STENT INTERVENTION N/A 07/01/2023   Procedure: CORONARY STENT INTERVENTION;  Surgeon: Darron Deatrice LABOR, MD;  Location: [facility];  Service: Cardiovascular;  Laterality: N/A;   EYE SURGERY     GASTRIC BYPASS  2017   Sleve   LAPAROSCOPIC GASTRIC SLEEVE RESECTION     LEFT HEART CATH AND  CORONARY ANGIOGRAPHY N/A 07/01/2023  Procedure: LEFT HEART CATH AND CORONARY ANGIOGRAPHY;  Surgeon: Darron Deatrice LABOR, MD;  Location: [facility];  Service: Cardiovascular;  Laterality: N/A;   LEFT HEART CATH AND CORONARY ANGIOGRAPHY N/A 08/01/2023   Procedure: LEFT HEART CATH AND CORONARY ANGIOGRAPHY;  Surgeon: Ladona Heinz, MD;  Location: [facility];  Service: Cardiovascular;  Laterality: N/A;   Low back disc surgery     06/2000   TOTAL KNEE ARTHROPLASTY Right 11/2016   Social History   Occupational History   Not on file  Tobacco Use   Smoking status: Never    Passive exposure: Never   Smokeless tobacco: Never  Vaping Use   Vaping status: Never Used  Substance and Sexual Activity   Alcohol use: Yes    Comment: rare   Drug use: Never   Sexual activity: Yes

## 2024-02-23 LAB — URIC ACID: Uric Acid, Serum: 6.7 mg/dL (ref 2.5–7.0)

## 2024-02-24 ENCOUNTER — Ambulatory Visit: Admitting: Physical Therapy

## 2024-02-28 ENCOUNTER — Encounter (HOSPITAL_BASED_OUTPATIENT_CLINIC_OR_DEPARTMENT_OTHER): Payer: Self-pay | Admitting: Student

## 2024-02-28 ENCOUNTER — Ambulatory Visit (INDEPENDENT_AMBULATORY_CARE_PROVIDER_SITE_OTHER)
Admission: RE | Admit: 2024-02-28 | Discharge: 2024-02-28 | Disposition: A | Discharge status: Home / Self Care | Source: Ambulatory Visit | Attending: Student | Admitting: Student

## 2024-02-28 ENCOUNTER — Ambulatory Visit: Admitting: Rehabilitative and Restorative Service Providers"

## 2024-02-28 ENCOUNTER — Other Ambulatory Visit (HOSPITAL_BASED_OUTPATIENT_CLINIC_OR_DEPARTMENT_OTHER): Payer: Self-pay | Admitting: Family Medicine

## 2024-02-28 ENCOUNTER — Encounter (HOSPITAL_BASED_OUTPATIENT_CLINIC_OR_DEPARTMENT_OTHER): Payer: Self-pay | Admitting: Radiology

## 2024-02-28 DIAGNOSIS — Z1231 Encounter for screening mammogram for malignant neoplasm of breast: Secondary | ICD-10-CM

## 2024-02-28 DIAGNOSIS — E785 Hyperlipidemia, unspecified: Secondary | ICD-10-CM

## 2024-02-28 DIAGNOSIS — T7840XD Allergy, unspecified, subsequent encounter: Secondary | ICD-10-CM

## 2024-02-28 DIAGNOSIS — M75112 Incomplete rotator cuff tear or rupture of left shoulder, not specified as traumatic: Secondary | ICD-10-CM

## 2024-02-28 MED ORDER — FENOFIBRATE 48 MG PO TABS: 48.0000 mg | ORAL_TABLET | Freq: Every day | ORAL | 1 refills | Status: DC

## 2024-02-28 MED ORDER — MONTELUKAST SODIUM 10 MG PO TABS: 10.0000 mg | ORAL_TABLET | Freq: Every day | ORAL | 1 refills | Status: DC

## 2024-02-29 MED ORDER — OXYCODONE HCL 5 MG PO CAPS: 5.0000 mg | ORAL_CAPSULE | Freq: Four times a day (QID) | ORAL | 0 refills | Status: DC | PRN

## 2024-03-01 ENCOUNTER — Telehealth (HOSPITAL_BASED_OUTPATIENT_CLINIC_OR_DEPARTMENT_OTHER): Payer: Self-pay

## 2024-03-01 ENCOUNTER — Ambulatory Visit (INDEPENDENT_AMBULATORY_CARE_PROVIDER_SITE_OTHER): Admitting: *Deleted

## 2024-03-01 ENCOUNTER — Telehealth (HOSPITAL_BASED_OUTPATIENT_CLINIC_OR_DEPARTMENT_OTHER): Payer: Self-pay | Admitting: Student

## 2024-03-01 ENCOUNTER — Other Ambulatory Visit (HOSPITAL_BASED_OUTPATIENT_CLINIC_OR_DEPARTMENT_OTHER): Payer: Self-pay

## 2024-03-01 DIAGNOSIS — J309 Allergic rhinitis, unspecified: Secondary | ICD-10-CM | POA: Diagnosis not present

## 2024-03-01 NOTE — Telephone Encounter (Signed)
 Copied from CRM [id number]. Topic: Clinical - Medication Question >> Mar 01, 2024 10:36 AM Carlatta H wrote: Reason for CRM: CMA Thalia , the patient would like a call back regarding prescription

## 2024-03-01 NOTE — Telephone Encounter (Signed)
 Called pt and informed pt fenofibrate  was sent in as error. She did pick up rx and took It home. I will call [facility] to have it cancelled.

## 2024-03-01 NOTE — Telephone Encounter (Signed)
 Pt advised and voices understanding.  Pt will take rx back to pharmacy.

## 2024-03-01 NOTE — Telephone Encounter (Signed)
 Called [facility] [place] to have rx cancelled and apologized. They will not take rx back. They said they can destroy it. Pt had a copay of $0.00. We will call pt to inform her. Rx removed from [facility].

## 2024-03-02 ENCOUNTER — Encounter: Admitting: Physical Therapy

## 2024-03-06 ENCOUNTER — Encounter: Admitting: Rehabilitative and Restorative Service Providers"

## 2024-03-06 ENCOUNTER — Other Ambulatory Visit: Payer: Self-pay

## 2024-03-06 DIAGNOSIS — Z006 Encounter for examination for normal comparison and control in clinical research program: Secondary | ICD-10-CM

## 2024-03-08 ENCOUNTER — Encounter: Payer: Self-pay | Admitting: Physical Medicine & Rehabilitation

## 2024-03-08 ENCOUNTER — Ambulatory Visit (INDEPENDENT_AMBULATORY_CARE_PROVIDER_SITE_OTHER): Admitting: *Deleted

## 2024-03-08 DIAGNOSIS — J309 Allergic rhinitis, unspecified: Secondary | ICD-10-CM | POA: Diagnosis not present

## 2024-03-09 ENCOUNTER — Encounter: Admitting: Physical Therapy

## 2024-03-13 ENCOUNTER — Telehealth: Payer: Self-pay | Admitting: Cardiology

## 2024-03-13 ENCOUNTER — Encounter: Admitting: Rehabilitative and Restorative Service Providers"

## 2024-03-13 NOTE — Telephone Encounter (Signed)
 Pt c/o of Chest Pain: STAT if active (IN THIS MOMENT) CP, including tightness, pressure, jaw pain, shoulder/upper arm/back pain, SOB, nausea, and vomiting.  1. Are you having CP right now (tightness, pressure, or discomfort)? discomfort  2. Are you experiencing any other symptoms (ex. SOB, nausea, vomiting, sweating)? Sweating   3. How long have you been experiencing CP? On and of since Sunday   4. Is your CP continuous or coming and going? Coming and going   5. Have you taken Nitroglycerin ? yes  6. If CP returns before callback, please consider calling 911. ?

## 2024-03-13 NOTE — Telephone Encounter (Signed)
 Buel, agree.  I am in the office next week but she can also be seen by APP or DOD.  May need additional testing +/- increase in anti-anginal medications.   Dr. Gearldene Fiorenza

## 2024-03-13 NOTE — Telephone Encounter (Signed)
 Called and spoke to pt regarding the Chest pains. These all started on Sunday (03/11/24); she had one episode of an Intense, Tight/wrench like feeling in her Chest accompanied with Diaphoresis and Palpitations. The pain was similar to the CP she had in Nov 2024 that led to PCI. She took one SL NTG and CP was relieved. She then had 2 more episodes on 03/12/24 and also took one SL NTG after each episode. Today, on 03/13/24, she had another episode and this one was the longest at 10-15 minutes. She took another SL NTG.   Pt was advised to call 911 or have someone take her to the nearest Emergency Department. She is hesitant as she states the last time she went to the ED, they sent her home. Appt made to see Dr. Michele on 03/27/24. Pt states that if she has this CP episode again, she will go to the Emergency Room.

## 2024-03-14 ENCOUNTER — Ambulatory Visit (INDEPENDENT_AMBULATORY_CARE_PROVIDER_SITE_OTHER): Admitting: *Deleted

## 2024-03-14 DIAGNOSIS — J309 Allergic rhinitis, unspecified: Secondary | ICD-10-CM | POA: Diagnosis not present

## 2024-03-14 NOTE — Telephone Encounter (Signed)
 Spoke with pt over the phone and explained that we wanted to try and get her in with an APP or DOD appt sooner than the 8/19 appt with Dr. Michele to be evaluated. Pt scheduled for DOD appt with Dr. Court on 8/13 (declined earlier appts due to work schedule). ED precautions advised in the mean time if any more CP episodes occurred before being evaluated in the office. Pt states she hasn't had any this morning and feels good. Pt told to call our office back with any questions or concerns.

## 2024-03-16 ENCOUNTER — Encounter: Admitting: Physical Therapy

## 2024-03-17 LAB — GENECONNECT MOLECULAR SCREEN: Genetic Analysis Overall Interpretation: NEGATIVE

## 2024-03-20 ENCOUNTER — Encounter

## 2024-03-20 ENCOUNTER — Ambulatory Visit (INDEPENDENT_AMBULATORY_CARE_PROVIDER_SITE_OTHER): Admitting: *Deleted

## 2024-03-20 DIAGNOSIS — J309 Allergic rhinitis, unspecified: Secondary | ICD-10-CM | POA: Diagnosis not present

## 2024-03-21 ENCOUNTER — Emergency Department (HOSPITAL_COMMUNITY): Admission: EM | Admit: 2024-03-21 | Discharge: 2024-03-21 | Discharge status: Against Medical Advice

## 2024-03-21 ENCOUNTER — Other Ambulatory Visit: Payer: Self-pay

## 2024-03-21 ENCOUNTER — Ambulatory Visit: Attending: Cardiovascular Disease | Admitting: Cardiovascular Disease

## 2024-03-21 ENCOUNTER — Emergency Department (HOSPITAL_COMMUNITY)

## 2024-03-21 ENCOUNTER — Encounter (HOSPITAL_COMMUNITY): Payer: Self-pay

## 2024-03-21 ENCOUNTER — Encounter: Payer: Self-pay | Admitting: Cardiovascular Disease

## 2024-03-21 VITALS — BP 118/84 | HR 63 | Ht 63.0 in | Wt 214.2 lb

## 2024-03-21 DIAGNOSIS — R0789 Other chest pain: Secondary | ICD-10-CM | POA: Diagnosis not present

## 2024-03-21 DIAGNOSIS — E785 Hyperlipidemia, unspecified: Secondary | ICD-10-CM | POA: Diagnosis not present

## 2024-03-21 DIAGNOSIS — Z5321 Procedure and treatment not carried out due to patient leaving prior to being seen by health care provider: Secondary | ICD-10-CM | POA: Insufficient documentation

## 2024-03-21 DIAGNOSIS — I2510 Atherosclerotic heart disease of native coronary artery without angina pectoris: Secondary | ICD-10-CM | POA: Diagnosis not present

## 2024-03-21 DIAGNOSIS — R0602 Shortness of breath: Secondary | ICD-10-CM | POA: Diagnosis present

## 2024-03-21 DIAGNOSIS — R002 Palpitations: Secondary | ICD-10-CM

## 2024-03-21 DIAGNOSIS — I10 Essential (primary) hypertension: Secondary | ICD-10-CM

## 2024-03-21 DIAGNOSIS — I25110 Atherosclerotic heart disease of native coronary artery with unstable angina pectoris: Secondary | ICD-10-CM

## 2024-03-21 LAB — BASIC METABOLIC PANEL WITH GFR
Anion gap: 8 (ref 5–15)
BUN: 17 mg/dL (ref 6–20)
CO2: 22 mmol/L (ref 22–32)
Calcium: 9.3 mg/dL (ref 8.9–10.3)
Chloride: 108 mmol/L (ref 98–111)
Creatinine, Ser: 1.07 mg/dL — ABNORMAL HIGH (ref 0.44–1.00)
GFR, Estimated: >60 mL/min (ref >60)
Glucose, Bld: 74 mg/dL (ref 70–99)
Potassium: 4.2 mmol/L (ref 3.5–5.1)
Sodium: 138 mmol/L (ref 135–145)

## 2024-03-21 LAB — CBC
HCT: 36.2 % (ref 36.0–46.0)
Hemoglobin: 10.9 g/dL — ABNORMAL LOW (ref 12.0–15.0)
MCH: 24 pg — ABNORMAL LOW (ref 26.0–34.0)
MCHC: 30.1 g/dL (ref 30.0–36.0)
MCV: 79.7 fL — ABNORMAL LOW (ref 80.0–100.0)
Platelets: 435 K/uL — ABNORMAL HIGH (ref 150–400)
RBC: 4.54 MIL/uL (ref 3.87–5.11)
RDW: 17.4 % — ABNORMAL HIGH (ref 11.5–15.5)
WBC: 14.2 K/uL — ABNORMAL HIGH (ref 4.0–10.5)
nRBC: 0 % (ref 0.0–0.2)

## 2024-03-21 LAB — TROPONIN I (HIGH SENSITIVITY): Troponin I (High Sensitivity): 2 ng/L (ref <18)

## 2024-03-21 NOTE — ED Triage Notes (Signed)
 Pt bib POV c/o sudden onset of sob with chest tightness that started 10 min ago. Pt says she has an extensive medical hx. Pt states this has happened in past.

## 2024-03-21 NOTE — Patient Instructions (Signed)
 Medication Instructions:  Your physician recommends that you continue on your current medications as directed. Please refer to the Current Medication list given to you today.  *If you need a refill on your cardiac medications before your next appointment, please call your pharmacy*  Testing/Procedures: Dr. Court has ordered a Lexiscan Myocardial Perfusion Imaging Study.  Please arrive 15 minutes prior to your appointment time for registration and insurance purposes.   The test will take approximately 3 to 4 hours to complete; you may bring reading material.  If someone comes with you to your appointment, they will need to remain in the main lobby due to limited space in the testing area. **If you are pregnant or breastfeeding, please notify the nuclear lab prior to your appointment**   How to prepare for your Myocardial Perfusion Test: Do not eat or drink 3 hours prior to your test, except you may have water. Do not consume products containing caffeine (regular or decaffeinated) 12 hours prior to your test. (ex: coffee, chocolate, sodas, tea). Do wear comfortable clothes (no dresses or overalls) and walking shoes, tennis shoes preferred (No heels or open toe shoes are allowed). Do NOT wear cologne, perfume, aftershave, or lotions (deodorant is allowed). If you use an inhaler, use it the AM of your test and bring it with you.  If you use a nebulizer, use it the AM of your test.  If these instructions are not followed, your test will have to be rescheduled.   Follow-Up: At [facility], you and your health needs are our priority.  As part of our continuing mission to provide you with exceptional heart care, our providers are all part of one team.  This team includes your primary Cardiologist (physician) and Advanced Practice Providers or APPs (Physician Assistants and Nurse Practitioners) who all work together to provide you with the care you need, when you need it.  Your next  appointment:   2-3 week(s)  Provider:   Madonna Large, DO    We recommend signing up for the patient portal called MyChart.  Sign up information is provided on this After Visit Summary.  MyChart is used to connect with patients for Virtual Visits (Telemedicine).  Patients are able to view lab/test results, encounter notes, upcoming appointments, etc.  Non-urgent messages can be sent to your provider as well.   To learn more about what you can do with MyChart, go to [email or web address].

## 2024-03-21 NOTE — Assessment & Plan Note (Signed)
 History of CAD status post LAD stenting November 24 with recath a month later by Dr. Ladona revealing a patent stent with minimal nonobstructive CAD.  This was done because of chest pain.  She has had recurrent chest pain though right sided over the last several weeks which is nitro responsive.  She has been tried on long-acting nitrates which she does not tolerate.  Her blood pressure is too soft for other antianginals and her heart rate is already 63.  Her EKG shows sinus rhythm without ST or T wave changes.  I am going to get a Lexiscan Myoview stress test to evaluate and have her see an APP back after that 1 to 2 weeks for follow-up.  I have discussed the case with her primary cardiologist, Dr. Michele .

## 2024-03-21 NOTE — Group Note (Deleted)
 Date:  03/21/2024 Time:  2:22 PM  Group Topic/Focus:  Wellness Toolbox:   The focus of this group is to discuss various aspects of wellness, balancing those aspects and exploring ways to increase the ability to experience wellness.  Patients will create a wellness toolbox for use upon discharge.     Participation Level:  {BHH PARTICIPATION OZCZO:77735}  Participation Quality:  {BHH PARTICIPATION QUALITY:22265}  Affect:  {BHH AFFECT:22266}  Cognitive:  {BHH COGNITIVE:22267}  Insight: {BHH Insight2:20797}  Engagement in Group:  {BHH ENGAGEMENT IN HMNLE:77731}  Modes of Intervention:  {BHH MODES OF INTERVENTION:22269}  Additional Comments:  ***  Casey [name] 03/21/2024, 2:22 PM

## 2024-03-21 NOTE — Progress Notes (Signed)
 03/21/2024 Casey [name]   [birth year]  [id number]  Primary Physician Rothfuss, Lang DASEN, PA-C Primary Cardiologist: Dorn JINNY Lesches MD GENI CODY MADEIRA, FSCAI  HPI:  Casey [name] is a 53 y.o. 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  She is a patient of Dr. Michele.  She has a history of gastric sleeve for morbid obesity with 100 pound weight loss which she has maintained.  She has treated hypertension, hyperlipidemia.  She was a type II diabetic but not since her weight loss.  She has never had a heart attack or stroke.  She did have an LAD stent in November 2024 with relook 1 month later because of chest pain Dr. Ladona that showed a patent stent with minimal nonobstructive CAD.  Over the last several weeks she has had recurrent right-sided chest pain which is nitro responsive.  She has been started on a long-acting nitrate in the past which she did not tolerate.  In addition, she tells me that she had syncope in February of this year and a recurrent fall in June of unclear etiology.   Current Meds  Medication Sig   acetaminophen  (TYLENOL ) 500 MG tablet Take 1,000 mg by mouth every 12 (twelve) hours as needed for mild pain or moderate pain.   aMILoride (MIDAMOR) 5 MG tablet Take 10 mg by mouth daily.   atorvastatin  (LIPITOR) 40 MG tablet Take 2 tablets (80 mg total) by mouth daily.   B Complex-C (B-COMPLEX WITH VITAMIN C) tablet Take 1 tablet by mouth daily.   Cholecalciferol  (VITAMIN D3) 50 MCG (2000 UT) TABS Take 1 tablet by mouth daily.   Evolocumab  (REPATHA  SURECLICK) 140 MG/ML SOAJ Inject 140 mg into the skin every 14 (fourteen) days.   famotidine  (PEPCID ) 40 MG tablet Take 40 mg by mouth 2 (two) times daily.   levocetirizine (XYZAL) 5 MG tablet Take 10 mg by mouth daily.   losartan (COZAAR) 50 MG tablet Take 50 mg by mouth daily.   lubiprostone (AMITIZA) 8 MCG capsule Take 16 mcg by mouth daily with breakfast.    mirabegron  ER (MYRBETRIQ ) 50 MG TB24 tablet Take 1 tablet (50 mg total) by mouth daily.   montelukast  (SINGULAIR ) 10 MG tablet Take 1 tablet (10 mg total) by mouth daily.   nitroGLYCERIN  (NITROSTAT ) 0.4 MG SL tablet Place 1 tablet (0.4 mg total) under the tongue every 5 (five) minutes as needed for chest pain.   oxycodone  (OXY-IR) 5 MG capsule Take 1 capsule (5 mg total) by mouth every 6 (six) hours as needed.   RABEprazole (ACIPHEX) 20 MG tablet Take 20 mg by mouth 2 (two) times daily.   Secukinumab (COSENTYX SENSOREADY PEN) 150 MG/ML SOAJ Inject 150 mg into the skin.   ticagrelor  (BRILINTA ) 90 MG TABS tablet Take 1 tablet (90 mg total) by mouth 2 (two) times daily.     Allergies  Allergen Reactions   Albuterol  Sulfate Shortness Of Breath    Other reaction(s): Other (See Comments)  can't breathe  Other reaction(s): Other (See Comments) can't breathe   Corticosteroids Dermatitis, Palpitations and Swelling    ABLE TO TOLERATE VIA IM AND IV Inhaled - HA    ocular irritation   Silicone Dermatitis and Other (See Comments)    Cast and Bandage Cover  severe skin breakdown Cast and Bandage Cover  Cannot tolerate tegaderm, needs IV 3000. Paper tape.  Rash after several hours of EKG leads, but willing  to tolerate.  Can tolerate silk tape for short stretches.  severe skin breakdown  severe skin breakdown Cast and Bandage Cover   Tape     Other reaction(s): Other (See Comments), Other (See Comments), Other (See Comments)  severe skin breakdown  Cast and Bandage Cover  severe skin breakdown  severe skin breakdown   Wound Dressings Dermatitis, Hives and Other (See Comments)    severe skin breakdown Cast and Bandage Cover  severe skin breakdown  severe skin breakdown Cast and Bandage Cover  Cannot tolerate tegaderm, needs IV 3000. Paper tape.  Rash after several hours of EKG leads, but willing to tolerate.  Can tolerate silk tape for short stretches.  Cast and Bandage Cover   Formoterol Other (See  Comments)    Other reaction(s): Headaches  Other reaction(s): Headaches  Other reaction(s): Headaches  Headaches  Other reaction(s): Headaches Other reaction(s): Headaches    Other reaction(s): Headaches    Headaches   Hydrocodone  Rash   Hydrocodone -Acetaminophen  Rash   Nickel Rash and Other (See Comments)   Other Rash    cast and bandage use  cast and bandage use  cast and bandage use  cast and bandage use    cast and bandage use cast and bandage use   Oxycodone -Acetaminophen  Other (See Comments)    headache   Salmeterol Rash   Strawberry Extract Hives    Full body rash from strawberry   Advair Hfa [Fluticasone-Salmeterol]     unknown   Proventil  Hfa [Albuterol ]     unknown   Asa [Aspirin ] Rash   Cleocin [Clindamycin] Rash   Clindamycin/Lincomycin Rash   Fludrocortisone Acetate Rash   Gabapentin Rash   Hydrochlorothiazide Rash   Imipramine Rash   Ipratropium Bromide Rash   Medroxyprogesterone Rash   Meloxicam Rash   Metformin And Related Rash   Nystatin Rash   Penicillins Rash   Pred Forte [Prednisolone] Rash   Prednisone Rash   Solu-Medrol  [Methylprednisolone ] Rash   Sulfa Antibiotics Rash   Tegaderm Ag Mesh [Silver] Rash   Toradol  [Ketorolac  Tromethamine ] Rash   Tramadol Rash    Social History   Socioeconomic History   Marital status: Married    Spouse name: Not on file   Number of children: 0   Years of education: Not on file   Highest education level: Professional school degree (e.g., MD, DDS, DVM, JD)  Occupational History   Not on file  Tobacco Use   Smoking status: Never    Passive exposure: Never   Smokeless tobacco: Never  Vaping Use   Vaping status: Never Used  Substance and Sexual Activity   Alcohol use: Yes    Comment: rare   Drug use: Never   Sexual activity: Yes  Other Topics Concern   Not on file  Social History Narrative   2 dogs    Social Drivers of Health   Financial Resource Strain: Low Risk  (02/12/2024)   Overall  Financial Resource Strain (CARDIA)    Difficulty of Paying Living Expenses: Not very hard  Food Insecurity: No Food Insecurity (02/12/2024)   Hunger Vital Sign    Worried About Running Out of Food in the Last Year: Never true    Ran Out of Food in the Last Year: Never true  Transportation Needs: No Transportation Needs (02/12/2024)   PRAPARE - Administrator, Civil Service (Medical): No    Lack of Transportation (Non-Medical): No  Physical Activity: Inactive (02/12/2024)   Exercise Vital Sign  Days of Exercise per Week: 0 days    Minutes of Exercise per Session: Not on file  Stress: No Stress Concern Present (02/12/2024)   [employer] of Occupational Health - Occupational Stress Questionnaire    Feeling of Stress: Not at all  Social Connections: Moderately Integrated (02/12/2024)   Social Connection and Isolation Panel    Frequency of Communication with Friends and Family: More than three times a week    Frequency of Social Gatherings with Friends and Family: Once a week    Attends Religious Services: Never    Database administrator or Organizations: Yes    Attends Banker Meetings: 1 to 4 times per year    Marital Status: Married  Catering manager Violence: Not At Risk (12/14/2023)   Humiliation, Afraid, Rape, and Kick questionnaire    Fear of Current or Ex-Partner: No    Emotionally Abused: No    Physically Abused: No    Sexually Abused: No     Review of Systems: General: negative for chills, fever, night sweats or weight changes.  Cardiovascular: negative for chest pain, dyspnea on exertion, edema, orthopnea, palpitations, paroxysmal nocturnal dyspnea or shortness of breath Dermatological: negative for rash Respiratory: negative for cough or wheezing Urologic: negative for hematuria Abdominal: negative for nausea, vomiting, diarrhea, bright red blood per rectum, melena, or hematemesis Neurologic: negative for visual changes, syncope, or dizziness All  other systems reviewed and are otherwise negative except as noted above.    Blood pressure 118/84, pulse 63, height 5' 3 (1.6 m), weight 214 lb 3.2 oz (97.2 kg), SpO2 96%.  General appearance: alert and no distress Neck: no adenopathy, no carotid bruit, no JVD, supple, symmetrical, trachea midline, and thyroid  not enlarged, symmetric, no tenderness/mass/nodules Lungs: clear to auscultation bilaterally Heart: regular rate and rhythm, S1, S2 normal, no murmur, click, rub or gallop Extremities: extremities normal, atraumatic, no cyanosis or edema Pulses: 2+ and symmetric Skin: Skin color, texture, turgor normal. No rashes or lesions Neurologic: Grossly normal  EKG EKG Interpretation Date/Time:  Wednesday March 21 2024 11:08:58 EDT Ventricular Rate:  63 PR Interval:  160 QRS Duration:  86 QT Interval:  394 QTC Calculation: 403 R Axis:   -1  Text Interpretation: Normal sinus rhythm with sinus arrhythmia Cannot rule out Anterior infarct , age undetermined When compared with ECG of 19-Feb-2024 08:39, PREVIOUS ECG IS PRESENT Confirmed by Court Carrier [phone] on 03/21/2024 11:30:59 AM    ASSESSMENT AND PLAN:   Coronary artery disease s/p PCI/DES to LAD 07/01/2023 History of CAD status post LAD stenting November 24 with recath a month later by Dr. Ladona revealing a patent stent with minimal nonobstructive CAD.  This was done because of chest pain.  She has had recurrent chest pain though right sided over the last several weeks which is nitro responsive.  She has been tried on long-acting nitrates which she does not tolerate.  Her blood pressure is too soft for other antianginals and her heart rate is already 63.  Her EKG shows sinus rhythm without ST or T wave changes.  I am going to get a Lexiscan Myoview stress test to evaluate and have her see an APP back after that 1 to 2 weeks for follow-up.  I have discussed the case with her primary cardiologist, Dr. Michele .     Carrier DOROTHA Court  MD FACP,FACC,FAHA, [facility] 03/21/2024 11:43 AM

## 2024-03-21 NOTE — ED Notes (Signed)
 Pt left without being seen.

## 2024-03-21 NOTE — ED Provider Triage Note (Signed)
 Emergency Medicine Provider Triage Evaluation Note  Casey [name] , a 53 y.o. female  was evaluated in triage.  Pt complains of shob, chest pain, tightness. Hx of CAD, DVT, reports mostly compliant on brillinta but sometimes misses a dose in the evening. Did take it last night. Saw cardiologist this morning and scheduled for stress test.  Review of Systems  Positive: Chest pain, shob Negative:   Physical Exam  BP 129/81 (BP Location: Left Arm)   Pulse (!) 101   Temp 98 F (36.7 C) (Oral)   Resp (!) 21   Ht 5' 3 (1.6 m)   Wt 97.1 kg   SpO2 100%   BMI 37.91 kg/m  Gen:   Awake, no distress   Resp:  Normal effort  MSK:   Moves extremities without difficulty  Other:    Medical Decision Making  Medically screening exam initiated at 2:50 PM.  Appropriate orders placed.  Casey [name] was informed that the remainder of the evaluation will be completed by another provider, this initial triage assessment does not replace that evaluation, and the importance of remaining in the ED until their evaluation is complete.  Workup initiated in triage    Casey [name], [place] 03/21/24 1454

## 2024-03-23 ENCOUNTER — Encounter: Admitting: Physical Therapy

## 2024-03-27 ENCOUNTER — Ambulatory Visit: Admitting: Cardiology

## 2024-03-27 ENCOUNTER — Encounter: Admitting: Rehabilitative and Restorative Service Providers"

## 2024-03-27 DIAGNOSIS — J3081 Allergic rhinitis due to animal (cat) (dog) hair and dander: Secondary | ICD-10-CM | POA: Diagnosis not present

## 2024-03-27 DIAGNOSIS — J301 Allergic rhinitis due to pollen: Secondary | ICD-10-CM | POA: Diagnosis not present

## 2024-03-27 NOTE — Progress Notes (Signed)
 VIALS MADE 03-27-24

## 2024-03-28 ENCOUNTER — Ambulatory Visit (INDEPENDENT_AMBULATORY_CARE_PROVIDER_SITE_OTHER)

## 2024-03-28 ENCOUNTER — Encounter (INDEPENDENT_AMBULATORY_CARE_PROVIDER_SITE_OTHER): Payer: Self-pay | Admitting: Physician Assistant

## 2024-03-28 ENCOUNTER — Ambulatory Visit (INDEPENDENT_AMBULATORY_CARE_PROVIDER_SITE_OTHER): Admitting: Physician Assistant

## 2024-03-28 VITALS — BP 124/87 | HR 80

## 2024-03-28 DIAGNOSIS — J309 Allergic rhinitis, unspecified: Secondary | ICD-10-CM | POA: Diagnosis not present

## 2024-03-28 DIAGNOSIS — R42 Dizziness and giddiness: Secondary | ICD-10-CM

## 2024-03-28 NOTE — Progress Notes (Signed)
 Dear Dr. Iven, Here is my assessment for our mutual patient, Casey [name]. Thank you for allowing me the opportunity to care for your patient. Please do not hesitate to contact me should you have any other questions. Sincerely, Chyrl Cohen PA-C  Otolaryngology Clinic Note Referring provider: Dr. Iven HPI:  Casey [name] is a 53 y.o. female kindly referred by Dr. Iven   The patient is a 53 year old female seen in our office for evaluation of dizziness.  The patient notes that in February 2025 she had an episode of syncope.  She notes that she does not recall passing out but did have a syncopal episode.  She notes a few months later she had a second episode, preceding that she felt very hot, sweaty, dizzy, this was after taking the trash from the street.  She had another fall but did not pass out this time.  She notes she has had several episodes of dizziness and sweatiness since that time.  She notes this is accompanied with significant dizziness.  She denies any associated hearing loss, ringing in the ears.  She does note a history of migraines but has not had migraines in several years.  She notes that sometimes when she has the symptoms that it is associated with headache but she also has them when she does not have headaches.  She notes palpitations at the time of her dizziness.  Her husband reports that her voice changes as well.  At baseline she does have some intermittent ringing of the ears.  She denies any decreased hearing, no preceding infections.  She notes the symptoms usually happen in the middle of the day, they take several hours up to the next day before the resolve completely in between.  She has been seen by her primary care provider who is attempting to evaluate the source of dizziness.  She has follow-up with cardiology as well as she feels this is more likely cardiac in nature.   Independent Review of Additional Tests or Records:  Primary care office visit note on  02/14/2024   PMH/Meds/All/SocHx/FamHx/ROS:   Past Medical History:  Diagnosis Date   Albuminuria 06/27/2015   Anxiety 11/04/2014   Arm DVT (deep venous thromboembolism), acute, left (HCC) 08/17/2022   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/30/2014   Asthma    Atopic dermatitis 10/30/2008   Formatting of this note might be different from the original. Atopic dermatitis Formatting of this note might be different from the original. Formatting of this note might be different from the original. Atopic dermatitis Formatting of this note might be different from the original. Formatting of this note might be different from the original. Formatting of this note might be different from the or   Cataract 05/06/2015   Contusion of left wrist 07/08/2021   Coronary artery disease s/p PCI/DES to LAD 07/01/2023 12/28/2022   Crohn's disease (HCC) 04/21/2015   De Quervain's tenosynovitis, left 07/31/2021   Diabetes mellitus without complication (HCC)    Diabetes type 2, controlled (HCC) 04/21/2015   Dry eyes 05/06/2015   DVT (deep venous thrombosis) (HCC)    Dysfunction of both eustachian tubes 08/31/2012   Encounter for monitoring immunomodulating therapy 11/20/2021   Environmental allergies 08/12/2020   Esophageal candidiasis (HCC) 06/26/2022   Essential hypertension 06/27/2015   Fibrositis 08/28/2014   Formatting of this note might be different from the original. Fibromyalgia Formatting of this note might be different from the original. Formatting of this note might be different from the original.  Fibromyalgia   Flank pain 11/24/2020   Gastroesophageal reflux disease with esophagitis without hemorrhage 08/01/2019   Formatting of this note might be different from the original. Formatting of this note might be different from the original. 07/2019: chronic symptoms of esophageal reflux since prior to sleeve gastrectomy surgery. Symptoms have worsened over the last 6 months with recent  EGD findings of grade B esophagitis, irregular z line, erythematous mucosa, and evidence of sleeve gastrectomy. 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 Formatting of this note might be different from the original. 07/2019: 8 mm colonic polyp removed during colonoscopy, biopsy showed serrated polyp. Repeat colonoscopy recommended in one year. Formatting of this note might be different from the original. 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 Hypertension Formatting of this note might be different from the original. Formatting of this note might be different from the original. 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 Keratoconus Formatting of this note might be different from the original. Formatting of this note might be different from the original. Keratoconus Formatting of this note might be different from the original. Formatting of this note might be different from the original. Keratoconus Formatting of this note might be different from the or   Microhematuria 06/27/2015   Migraine 08/28/2014   Formatting of this note might be different from the original. Migraine Formatting of this note might be different from the original. Formatting of this note might be different from the  original. Migraine Formatting of this note might be different from the original. Migraine Formatting of this note might be different from the original. Migraine Formatting of this note might be different from the or   Migraine without aura and without status migrainosus, not intractable 08/28/2014   Formatting of this note might be different from the original. Formatting of this note might be different from the original. Formatting of this note might be different from the original. Migraine Formatting of this note might be different from the original. Migraine Formatting of this note might be different from the original. Formatting of this note might be different from the original. Migraine F   Mild intermittent asthma without complication 10/30/2008   Formatting of this note might be different from the original. Formatting of this note might be different from the original. Formatting of this note might be different from the original. Asthma Formatting of this note might be different from the original. Asthma Formatting of this note might be different from the original. Formatting of this note might be different from the original. 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 Formatting of this note might be different from the original. 01/25/2016  with Dr. Loney Pol at [facility]. [place] of this note might be different from the original. 01/25/2016 with Dr. Loney Pol at [facility]. [place] of this note might be different from the original. Formatting of this note might be different from    Seronegative inflammatory arthritis 08/28/2014   Formatting of this note might be different from the original. Formatting of this note might be different from the original. Arthritis; diagnosed as IBD related - 2012 Formatting of this note might be different from the original. Followed by rheumatologist. Had to change providers due to an insurance change earlier this year resulting in patient not being able to have Remicade  for ~6 months. 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 Formatting of this note might be different from the original. Treated with PRN miralax . Recent colonoscopy 07/2019. Formatting of this note might be different from the original. Treated with PRN miralax . Recent colonoscopy 07/2019. Formatting of this note might be different from the original. Formatting of this note might be different f   Status post placement of ureteral stent 02/06/2021   Tooth infection 08/12/2020   Unstable angina (HCC) 07/29/2023   Ureteral stone 01/20/2022   Urinary tract infection, site not specified 06/26/2022     Past Surgical History:  Procedure Laterality Date   BACK SURGERY     L5-S1   CERVICAL CONE BIOPSY  04/2000   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   CORONARY STENT INTERVENTION N/A 07/01/2023   Procedure: CORONARY STENT INTERVENTION;  Surgeon: Darron Deatrice LABOR, MD;  Location: [facility];  Service: Cardiovascular;  Laterality: N/A;   EYE SURGERY     GASTRIC BYPASS  2017   Sleve   LAPAROSCOPIC GASTRIC SLEEVE RESECTION     LEFT HEART CATH AND CORONARY ANGIOGRAPHY N/A 07/01/2023   Procedure: LEFT HEART CATH  AND CORONARY ANGIOGRAPHY;  Surgeon: Darron Deatrice LABOR, MD;  Location: [facility];  Service: Cardiovascular;  Laterality: N/A;   LEFT HEART CATH AND CORONARY ANGIOGRAPHY N/A 08/01/2023   Procedure: LEFT HEART CATH AND CORONARY ANGIOGRAPHY;  Surgeon: Ladona Heinz, MD;  Location: [facility];  Service: Cardiovascular;  Laterality: N/A;   Low back disc surgery     06/2000   TOTAL KNEE ARTHROPLASTY Right 11/2016    Family History  Adopted: Yes  Problem Relation Age of Onset   Diabetes Mother    Hypertension Mother    Diabetes Father    Hypertension Father    Hypertension Sister    Diabetes Sister    Hypertension Brother    Diabetes Brother    Diabetes Paternal Grandmother    Cancer Other    Asthma Neg Hx    Allergic rhinitis Neg Hx    Atopy Neg Hx    Eczema Neg Hx      Social Connections: Moderately Integrated (02/12/2024)   Social Connection and Isolation Panel    Frequency of Communication with Friends and  Family: More than three times a week    Frequency of Social Gatherings with Friends and Family: Once a week    Attends Religious Services: Never    Database administrator or Organizations: Yes    Attends Engineer, structural: 1 to 4 times per year    Marital Status: Married      Current Outpatient Medications:    acetaminophen  (TYLENOL ) 500 MG tablet, Take 1,000 mg by mouth every 12 (twelve) hours as needed for mild pain or moderate pain., Disp: , Rfl:    albuterol  (PROVENTIL ) (2.5 MG/3ML) 0.083% nebulizer solution, Take 3 mLs (2.5 mg total) by nebulization every 6 (six) hours as needed for wheezing or shortness of breath. (Patient not taking: Reported on 03/21/2024), Disp: 150 mL, Rfl: 1   aMILoride (MIDAMOR) 5 MG tablet, Take 10 mg by mouth daily., Disp: , Rfl:    atorvastatin  (LIPITOR) 40 MG tablet, Take 2 tablets (80 mg total) by mouth daily., Disp: 180 tablet, Rfl: 3   B Complex-C (B-COMPLEX WITH VITAMIN C) tablet, Take 1 tablet by mouth daily., Disp:  , Rfl:    Cholecalciferol  (VITAMIN D3) 50 MCG (2000 UT) TABS, Take 1 tablet by mouth daily., Disp: , Rfl:    EPINEPHrine  (EPIPEN  2-PAK) 0.3 mg/0.3 mL IJ SOAJ injection, Inject 0.3 mg into the muscle as needed for anaphylaxis. (Patient not taking: Reported on 03/21/2024), Disp: 2 each, Rfl: 2   Evolocumab  (REPATHA  SURECLICK) 140 MG/ML SOAJ, Inject 140 mg into the skin every 14 (fourteen) days., Disp: 2 mL, Rfl: 11   famotidine  (PEPCID ) 40 MG tablet, Take 40 mg by mouth 2 (two) times daily., Disp: , Rfl:    levocetirizine (XYZAL) 5 MG tablet, Take 10 mg by mouth daily., Disp: , Rfl:    losartan (COZAAR) 100 MG tablet, Take 100 mg by mouth daily., Disp: , Rfl:    losartan (COZAAR) 50 MG tablet, Take 50 mg by mouth daily., Disp: , Rfl:    lubiprostone (AMITIZA) 8 MCG capsule, Take 16 mcg by mouth daily with breakfast., Disp: , Rfl:    mirabegron  ER (MYRBETRIQ ) 50 MG TB24 tablet, Take 1 tablet (50 mg total) by mouth daily., Disp: 30 tablet, Rfl: 11   montelukast  (SINGULAIR ) 10 MG tablet, Take 1 tablet (10 mg total) by mouth daily., Disp: 90 tablet, Rfl: 1   nitroGLYCERIN  (NITROSTAT ) 0.4 MG SL tablet, Place 1 tablet (0.4 mg total) under the tongue every 5 (five) minutes as needed for chest pain., Disp: 30 tablet, Rfl: 3   oxycodone  (OXY-IR) 5 MG capsule, Take 1 capsule (5 mg total) by mouth every 6 (six) hours as needed., Disp: 30 capsule, Rfl: 0   RABEprazole (ACIPHEX) 20 MG tablet, Take 20 mg by mouth 2 (two) times daily., Disp: , Rfl:    Secukinumab (COSENTYX SENSOREADY PEN) 150 MG/ML SOAJ, Inject 150 mg into the skin., Disp: , Rfl:    ticagrelor  (BRILINTA ) 90 MG TABS tablet, Take 1 tablet (90 mg total) by mouth 2 (two) times daily., Disp: 180 tablet, Rfl: 3   Physical Exam:   BP 124/87   Pulse 80   SpO2 98%   Pertinent Findings  CN II-XII intact-no nystagmus Bilateral EAC clear and TM intact with well pneumatized middle ear spaces Anterior rhinoscopy: Septum midline; bilateral inferior  turbinates with hypertrophy No lesions of oral cavity/oropharynx; dentition in normal limits No obviously palpable neck masses/lymphadenopathy/thyromegaly No respiratory distress or stridor  Seprately Identifiable Procedures:  None  Impression & Plans:  Casey [name] is a 53 y.o. female with the following   Dizziness-  53 year old female seen in our office for evaluation of dizziness.  Uncertain etiology, lower suspicion for ear dysfunction.  She describes episodes of feeling very hot, sweaty, palpitations and is having episodes of syncope with this.  Some of her symptoms could be consistent with BPPV as she noted some head movements did make things worse but this is generally true for a lot of causes of dizziness.  She also notes that she did have some minimal improvement with Epley maneuver as well.  She has no signs or symptoms would be consistent with Mnire's disease, she has a history of migraines but has not had any in several years, lower suspicion for vertiginous migraines, low suspicion for vestibular neuritis.  At this time I do not think vestibular evaluation would be fruitful as she has several symptoms that are not consistent with vertigo.  She has had CT head as well as an MRI that were both normal.  I am happy to see the patient back in our office at any point in the future, the patient was happy with today's plan, she verbalized understanding and agreement today's plan had no further questions or concerns.   - f/u PRN   Thank you for allowing me the opportunity to care for your patient. Please do not hesitate to contact me should you have any other questions.  Sincerely, Chyrl Cohen PA-C [facility] Phone: [phone] Fax: [phone]  03/28/2024, 12:55 PM

## 2024-03-29 DIAGNOSIS — J3089 Other allergic rhinitis: Secondary | ICD-10-CM | POA: Diagnosis not present

## 2024-03-29 DIAGNOSIS — J302 Other seasonal allergic rhinitis: Secondary | ICD-10-CM | POA: Diagnosis not present

## 2024-03-30 ENCOUNTER — Encounter: Admitting: Physical Therapy

## 2024-04-02 ENCOUNTER — Encounter: Payer: Self-pay | Admitting: Orthopedic Surgery

## 2024-04-02 ENCOUNTER — Ambulatory Visit (INDEPENDENT_AMBULATORY_CARE_PROVIDER_SITE_OTHER): Admitting: Orthopedic Surgery

## 2024-04-02 ENCOUNTER — Telehealth: Payer: Self-pay | Admitting: Orthopedic Surgery

## 2024-04-02 ENCOUNTER — Other Ambulatory Visit: Payer: Self-pay

## 2024-04-02 DIAGNOSIS — M75112 Incomplete rotator cuff tear or rupture of left shoulder, not specified as traumatic: Secondary | ICD-10-CM | POA: Diagnosis not present

## 2024-04-02 MED ORDER — METHYLPREDNISOLONE ACETATE 40 MG/ML IJ SUSP
40.0000 mg | INTRAMUSCULAR | Status: AC | PRN
  Administered 2024-04-02: 40 mg via INTRA_ARTICULAR

## 2024-04-02 MED ORDER — LIDOCAINE HCL 1 % IJ SOLN
5.0000 mL | INTRAMUSCULAR | Status: AC | PRN
  Administered 2024-04-02: 5 mL

## 2024-04-02 NOTE — Telephone Encounter (Signed)
 Patient called and said I lost the note Dr. Harden gave me this morning  for work. Can he please write another one stating that I can return to work an will need [employer] permanently. I can pick it up tomorrow  morning.  I'm also trying to call this request in. CB#[phone]

## 2024-04-02 NOTE — Progress Notes (Signed)
 Office Visit Note   Patient: Casey [name]           Date of Birth: 1970/12/28           MRN: [id number] Visit Date: 04/02/2024              Requested by: Iven Lang DASEN, PA-C [address] [place],  [place] [address] PCP: Iven Lang DASEN, PA-C  Chief Complaint  Patient presents with   Left Shoulder - Follow-up      HPI: Patient is a 53 year old woman who presents in follow-up for impingement left shoulder.  Patient states that she did physical therapy and states that this was very helpful.  Assessment & Plan: Visit Diagnoses:  1. Nontraumatic incomplete tear of left rotator cuff     Plan: Patient underwent repeat injection.  She was provided a note to return to work today with use of [employer] account planner for dictation.  Follow-Up Instructions: Return if symptoms worsen or fail to improve.   Ortho Exam  Patient is alert, oriented, no adenopathy, well-dressed, normal affect, normal respiratory effort. Examination patient has full range of motion of the left shoulder.  She has mild tenderness with Neer and Hawkins impingement test as well as palpation over the biceps tendon.    Imaging: No results found. No images are attached to the encounter.  Labs: Lab Results  Component Value Date   HGBA1C 6.0 (H) 09/06/2023   HGBA1C 5.8 11/30/2022   ESRSEDRATE 46 (H) 10/24/2023   ESRSEDRATE 40 (H) 07/31/2023   CRP 0.8 07/31/2023   LABURIC 6.7 02/22/2024   REPTSTATUS 11/16/2023 FINAL 11/10/2023   REPTSTATUS 11/16/2023 FINAL 11/10/2023   CULT  11/10/2023    NO GROWTH 5 DAYS Performed at [facility], [address] [place], [place] [address]    CULT  11/10/2023    NO GROWTH 5 DAYS Performed at [facility], [address] [place], [place] [address]    LABORGA ESCHERICHIA COLI (A) 07/30/2023     Lab Results  Component Value Date   ALBUMIN 3.5 01/07/2024   ALBUMIN 4.2 11/30/2023   ALBUMIN 4.3 11/19/2023    Lab Results  Component Value Date   MG 1.9  02/19/2024   MG 1.9 01/07/2024   MG 2.0 11/30/2023   No results found for: VD25OH  No results found for: PREALBUMIN    Latest Ref Rng & Units 03/21/2024    2:56 PM 02/19/2024    9:11 AM 01/07/2024   10:04 AM  CBC EXTENDED  WBC 4.0 - 10.5 K/uL 14.2  8.1  10.4   RBC 3.87 - 5.11 MIL/uL 4.54  4.21  4.38   Hemoglobin 12.0 - 15.0 g/dL 89.0  89.5  89.2   HCT 36.0 - 46.0 % 36.2  32.9  34.8   Platelets 150 - 400 K/uL 435  352  388   NEUT# 1.7 - 7.7 K/uL  4.8    Lymph# 0.7 - 4.0 K/uL  2.4       There is no height or weight on file to calculate BMI.  Orders:  No orders of the defined types were placed in this encounter.  No orders of the defined types were placed in this encounter.    Procedures: Large Joint Inj: L subacromial bursa on 04/02/2024 9:33 AM Indications: diagnostic evaluation and pain Details: 22 G 1.5 in needle, posterior approach  Arthrogram: No  Medications: 5 mL lidocaine  1 %; 40 mg methylPREDNISolone  acetate 40 MG/ML Outcome: tolerated  well, no immediate complications Procedure, treatment alternatives, risks and benefits explained, specific risks discussed. Consent was given by the patient. Immediately prior to procedure a time out was called to verify the correct patient, procedure, equipment, support staff and site/side marked as required. Patient was prepped and draped in the usual sterile fashion.      Clinical Data: No additional findings.  ROS:  All other systems negative, except as noted in the HPI. Review of Systems  Objective: Vital Signs: There were no vitals taken for this visit.  Specialty Comments:  No specialty comments available.  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   Coronary artery disease s/p PCI/DES to LAD 07/01/2023 12/28/2022   Hyperlipidemia 10/07/2022   Hiatal hernia 10/07/2022   Esophageal candidiasis (HCC) 06/26/2022   Rheumatoid arthritis of multiple sites with 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 factor (HCC) 11/16/2021   De Quervain's tenosynovitis, left 07/31/2021   Status post placement of ureteral stent 02/06/2021   Proteinuria 08/12/2020   Environmental allergies 08/12/2020   History of kidney stones 08/12/2020   Gastroesophageal reflux disease with esophagitis without hemorrhage 08/01/2019   Slow transit constipation 08/01/2019   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   History of colon polyps 06/19/2019   Irregular astigmatism of both eyes 10/11/2018   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   Myopia with astigmatism and presbyopia 05/07/2016   Overactive bladder 11/18/2015   Incontinence 10/07/2015   Essential hypertension 06/27/2015   Cataract 05/06/2015   Dry eyes 05/06/2015   Crohn's disease (HCC) 04/21/2015   Osteoarthritis of both knees 04/21/2015   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/30/2014   History of abnormal cervical Pap smear 11/29/2014   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 Atopic dermatitis Formatting of this note might be different from the original. Formatting of this note might be different from the original. Atopic dermatitis Formatting of this note might be different from the original. Formatting of this note might be different from the original. Formatting of this note might be different from the or   Cataract 05/06/2015   Contusion of left wrist 07/08/2021   Coronary artery disease s/p PCI/DES to LAD 07/01/2023 12/28/2022   Crohn's disease (HCC) 04/21/2015   De Quervain's tenosynovitis, left 07/31/2021   Diabetes mellitus without complication (HCC)    Diabetes type 2, controlled (HCC) 04/21/2015   Dry eyes 05/06/2015   DVT (deep venous thrombosis) (HCC)    Dysfunction of both eustachian tubes 08/31/2012   Encounter for monitoring immunomodulating therapy 11/20/2021   Environmental allergies 08/12/2020  Esophageal candidiasis (HCC) 06/26/2022   Essential hypertension 06/27/2015   Fibrositis 08/28/2014   Formatting of this note might be different from the original. Fibromyalgia Formatting of this note might be different from the original. Formatting of this note might be different from the original. Fibromyalgia   Flank pain 11/24/2020   Gastroesophageal reflux disease with esophagitis without hemorrhage 08/01/2019   Formatting of this note might be different from the original. Formatting of this note might be different from the original. 07/2019: chronic symptoms of esophageal reflux since prior to sleeve gastrectomy surgery. Symptoms have worsened over the last 6 months with recent EGD findings of grade B esophagitis, irregular z line, erythematous mucosa, and evidence of sleeve gastrectomy. Biopsies with ch   Glaucoma suspect of both eyes 05/07/2016   Hematochezia 03/18/2016   Hematuria 06/18/2015   Hiatal hernia 10/07/2022   History of abnormal cervical Pap smear 11/29/2014   History of colon polyps 06/19/2019    Formatting of this note might be different from the original. Formatting of this note might be different from the original. 07/2019: 8 mm colonic polyp removed during colonoscopy, biopsy showed serrated polyp. Repeat colonoscopy recommended in one year. Formatting of this note might be different from the original. Added automatically from request for surgery [phone] Formatting of this note might b   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   History of kidney stones 08/12/2020   Hypercholesterolemia 10/07/2022   Hypertension, essential, benign 06/27/2015   Hypertensive disorder 08/28/2014   Formatting of this note might be different from the original. Hypertension Formatting of this note might be different from the original. Formatting of this note might be different from the original. 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 Keratoconus Formatting of this note might be different from the original. Formatting of this note might be different from the original. Keratoconus Formatting of this note might be different from the original. Formatting of this note might be different from the original. Keratoconus Formatting of this note might be different from the or   Microhematuria 06/27/2015   Migraine 08/28/2014   Formatting of this note might be different from the original. Migraine Formatting of this note might be different from the original. Formatting of this note might be different from the original. Migraine Formatting of this note might be different from the original. Migraine Formatting of this note might be different from the original. Migraine Formatting of this note might be different from the or   Migraine without aura and without status migrainosus, not intractable 08/28/2014   Formatting of this note might be different  from the original. Formatting of this note might be different from the original. Formatting of this note might be different from the original. Migraine Formatting of this note might be different from the original. Migraine Formatting of this note might be different from the original. Formatting of this note might be different from the original. Migraine F   Mild intermittent asthma without complication 10/30/2008   Formatting of this note might be different from the original. Formatting of this note might be different from the original. Formatting of this note might be different from the original. Asthma Formatting of this note might be different from the original. Asthma Formatting of this note might be different from the original. Formatting of this note might be different from the  original. 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 Formatting of this note might be different from the original. 01/25/2016 with Dr. Loney Pol at [facility]. [place] of this note might be different from the original. 01/25/2016 with Dr. Loney Pol at [facility]. [place] of this note might be different from the original. Formatting of this note might be different from    Seronegative inflammatory arthritis 08/28/2014   Formatting of this note  might be different from the original. Formatting of this note might be different from the original. Arthritis; diagnosed as IBD related - 2012 Formatting of this note might be different from the original. Followed by rheumatologist. Had to change providers due to an insurance change earlier this year resulting in patient not being able to have Remicade  for ~6 months. 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 Formatting of this note might be different from the original. Treated with PRN miralax . Recent colonoscopy 07/2019. Formatting of this note might be different from the original. Treated with PRN miralax . Recent colonoscopy 07/2019. Formatting of this note might be different from the original. Formatting of this note might be different f   Status post placement of ureteral stent 02/06/2021   Tooth infection 08/12/2020   Unstable angina (HCC) 07/29/2023   Ureteral stone 01/20/2022   Urinary tract infection, site not specified 06/26/2022    Family History  Adopted: Yes  Problem Relation Age of Onset   Diabetes Mother    Hypertension Mother    Diabetes Father    Hypertension Father    Hypertension Sister    Diabetes Sister    Hypertension Brother    Diabetes Brother    Diabetes Paternal Grandmother    Cancer Other    Asthma Neg Hx    Allergic rhinitis Neg Hx    Atopy Neg Hx    Eczema Neg Hx     Past Surgical History:  Procedure Laterality Date   BACK SURGERY     L5-S1   CERVICAL CONE BIOPSY  04/2000   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   CORONARY STENT INTERVENTION N/A 07/01/2023   Procedure: CORONARY STENT INTERVENTION;  Surgeon: Darron Deatrice LABOR, MD;  Location: [facility];  Service: Cardiovascular;  Laterality: N/A;   EYE SURGERY     GASTRIC BYPASS  2017   Sleve   LAPAROSCOPIC GASTRIC SLEEVE RESECTION     LEFT HEART CATH AND CORONARY ANGIOGRAPHY N/A  07/01/2023   Procedure: LEFT HEART CATH AND CORONARY ANGIOGRAPHY;  Surgeon: Darron Deatrice LABOR, MD;  Location: [facility];  Service: Cardiovascular;  Laterality: N/A;   LEFT HEART CATH AND CORONARY ANGIOGRAPHY N/A 08/01/2023   Procedure: LEFT HEART CATH AND CORONARY ANGIOGRAPHY;  Surgeon: Ladona Heinz, MD;  Location: [facility];  Service:  Cardiovascular;  Laterality: N/A;   Low back disc surgery     06/2000   TOTAL KNEE ARTHROPLASTY Right 11/2016   Social History   Occupational History   Not on file  Tobacco Use   Smoking status: Never    Passive exposure: Never   Smokeless tobacco: Never  Vaping Use   Vaping status: Never Used  Substance and Sexual Activity   Alcohol use: Yes    Comment: rare   Drug use: Never   Sexual activity: Yes

## 2024-04-03 ENCOUNTER — Telehealth (HOSPITAL_COMMUNITY): Payer: Self-pay | Admitting: *Deleted

## 2024-04-03 ENCOUNTER — Encounter: Admitting: Rehabilitative and Restorative Service Providers"

## 2024-04-03 ENCOUNTER — Encounter (HOSPITAL_COMMUNITY): Payer: Self-pay | Admitting: *Deleted

## 2024-04-03 ENCOUNTER — Ambulatory Visit (INDEPENDENT_AMBULATORY_CARE_PROVIDER_SITE_OTHER)

## 2024-04-03 ENCOUNTER — Ambulatory Visit: Admitting: Orthopedic Surgery

## 2024-04-03 DIAGNOSIS — J309 Allergic rhinitis, unspecified: Secondary | ICD-10-CM | POA: Diagnosis not present

## 2024-04-03 NOTE — Telephone Encounter (Signed)
 Letter with instructions for upcoming stress test sent via [employer].  Argentina Bees, RN

## 2024-04-03 NOTE — Telephone Encounter (Signed)
Letter at the front desk for pick up.

## 2024-04-05 ENCOUNTER — Encounter: Admitting: Nurse Practitioner

## 2024-04-05 DIAGNOSIS — J069 Acute upper respiratory infection, unspecified: Secondary | ICD-10-CM

## 2024-04-05 NOTE — Progress Notes (Signed)
 Casey [name] in at 7:02 and logged off. Did not log back in for scheduled appt time.

## 2024-04-06 ENCOUNTER — Telehealth: Payer: Self-pay | Admitting: Cardiology

## 2024-04-06 ENCOUNTER — Other Ambulatory Visit: Payer: Self-pay

## 2024-04-06 ENCOUNTER — Encounter (HOSPITAL_BASED_OUTPATIENT_CLINIC_OR_DEPARTMENT_OTHER): Payer: Self-pay | Admitting: Emergency Medicine

## 2024-04-06 ENCOUNTER — Emergency Department (HOSPITAL_BASED_OUTPATIENT_CLINIC_OR_DEPARTMENT_OTHER)
Admission: EM | Admit: 2024-04-06 | Discharge: 2024-04-06 | Disposition: A | Discharge status: Home / Self Care | Attending: Emergency Medicine | Admitting: Emergency Medicine

## 2024-04-06 DIAGNOSIS — R1111 Vomiting without nausea: Secondary | ICD-10-CM | POA: Diagnosis present

## 2024-04-06 DIAGNOSIS — I10 Essential (primary) hypertension: Secondary | ICD-10-CM | POA: Diagnosis not present

## 2024-04-06 DIAGNOSIS — J45909 Unspecified asthma, uncomplicated: Secondary | ICD-10-CM | POA: Diagnosis not present

## 2024-04-06 DIAGNOSIS — E119 Type 2 diabetes mellitus without complications: Secondary | ICD-10-CM | POA: Diagnosis not present

## 2024-04-06 DIAGNOSIS — U071 COVID-19: Secondary | ICD-10-CM | POA: Diagnosis not present

## 2024-04-06 DIAGNOSIS — I2510 Atherosclerotic heart disease of native coronary artery without angina pectoris: Secondary | ICD-10-CM | POA: Diagnosis not present

## 2024-04-06 DIAGNOSIS — Z79899 Other long term (current) drug therapy: Secondary | ICD-10-CM | POA: Insufficient documentation

## 2024-04-06 LAB — CBC WITH DIFFERENTIAL/PLATELET
Abs Immature Granulocytes: 0.06 K/uL (ref 0.00–0.07)
Basophils Absolute: 0 K/uL (ref 0.0–0.1)
Basophils Relative: 0 %
Eosinophils Absolute: 0.1 K/uL (ref 0.0–0.5)
Eosinophils Relative: 1 %
HCT: 35 % — ABNORMAL LOW (ref 36.0–46.0)
Hemoglobin: 11.2 g/dL — ABNORMAL LOW (ref 12.0–15.0)
Immature Granulocytes: 1 %
Lymphocytes Relative: 10 %
Lymphs Abs: 1 K/uL (ref 0.7–4.0)
MCH: 24.1 pg — ABNORMAL LOW (ref 26.0–34.0)
MCHC: 32 g/dL (ref 30.0–36.0)
MCV: 75.4 fL — ABNORMAL LOW (ref 80.0–100.0)
Monocytes Absolute: 1.1 K/uL — ABNORMAL HIGH (ref 0.1–1.0)
Monocytes Relative: 11 %
Neutro Abs: 7.4 K/uL (ref 1.7–7.7)
Neutrophils Relative %: 77 %
Platelets: 321 K/uL (ref 150–400)
RBC: 4.64 MIL/uL (ref 3.87–5.11)
RDW: 17.1 % — ABNORMAL HIGH (ref 11.5–15.5)
WBC: 9.6 K/uL (ref 4.0–10.5)
nRBC: 0 % (ref 0.0–0.2)

## 2024-04-06 LAB — BASIC METABOLIC PANEL WITH GFR
Anion gap: 15 (ref 5–15)
BUN: 11 mg/dL (ref 6–20)
CO2: 22 mmol/L (ref 22–32)
Calcium: 9.9 mg/dL (ref 8.9–10.3)
Chloride: 98 mmol/L (ref 98–111)
Creatinine, Ser: 0.96 mg/dL (ref 0.44–1.00)
GFR, Estimated: >60 mL/min (ref >60)
Glucose, Bld: 113 mg/dL — ABNORMAL HIGH (ref 70–99)
Potassium: 3.9 mmol/L (ref 3.5–5.1)
Sodium: 135 mmol/L (ref 135–145)

## 2024-04-06 LAB — RESP PANEL BY RT-PCR (RSV, FLU A&B, COVID)  RVPGX2
Influenza A by PCR: NEGATIVE
Influenza B by PCR: NEGATIVE
Resp Syncytial Virus by PCR: NEGATIVE
SARS Coronavirus 2 by RT PCR: POSITIVE — AB

## 2024-04-06 MED ORDER — ONDANSETRON 4 MG PO TBDP
8.0000 mg | ORAL_TABLET | Freq: Once | ORAL | Status: DC
  Filled 2024-04-06: qty 2

## 2024-04-06 MED ORDER — SODIUM CHLORIDE 0.9 % IV BOLUS
1000.0000 mL | Freq: Once | INTRAVENOUS | Status: AC
  Administered 2024-04-06: 1000 mL via INTRAVENOUS

## 2024-04-06 NOTE — Telephone Encounter (Signed)
 It's ok to take Repatha  when she has COVID. But if she would prefer to delay the dose a few days, then that is ok

## 2024-04-06 NOTE — ED Provider Notes (Signed)
 [facility] AT [facility] Provider Note   CSN: [id number] Arrival date & time: 04/06/24  0037     Patient presents with: Emesis and URI   RAISA DITTO is a 53 y.o. female.    Emesis Severity:  Moderate Duration:  3 days Timing:  Intermittent Quality:  Stomach contents Chronicity:  Recurrent Recent urination:  Normal Context: not post-tussive   Relieved by:  Nothing Worsened by:  Nothing Ineffective treatments:  None tried Associated symptoms: cough and URI   Associated symptoms: no fever   Risk factors: prior abdominal surgery   Risk factors comment:  Patient reports this happens from time to time and she sees ID about this and will need to see them this week but needs IV fluids.  She declines zofran  as she states she does not have nause and it will not prevent the emesis. URI Presenting symptoms: congestion, cough, ear pain and rhinorrhea   Presenting symptoms: no fever   Ear pain:    Location:  Right   Severity:  Moderate   Onset quality:  Gradual   Duration:  1 day   Timing:  Constant   Progression:  Unchanged   Chronicity:  New Severity:  Moderate Onset quality:  Gradual Duration:  1 day Timing:  Constant Progression:  Unchanged Chronicity:  New Relieved by:  Nothing Worsened by:  Nothing Ineffective treatments:  None tried Associated symptoms: no wheezing   Risk factors: sick contacts   Risk factors: no recent travel   Risk factors comment:  States her friend was diagnosed with a sinus infection.  States she has taken a covid test and it was negative.     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 Atopic dermatitis Formatting of this note  might be different from the original. Formatting of this note might be different from the original. Atopic dermatitis Formatting of this note might be different from the original. Formatting of this note might be different from the original. Formatting of this note might be different from the or   Cataract 05/06/2015   Contusion of left wrist 07/08/2021   Coronary artery disease s/p PCI/DES to LAD 07/01/2023 12/28/2022   Crohn's disease (HCC) 04/21/2015   De Quervain's tenosynovitis, left 07/31/2021   Diabetes mellitus without complication (HCC)    Diabetes type 2, controlled (HCC) 04/21/2015   Dry eyes 05/06/2015   DVT (deep venous thrombosis) (HCC)    Dysfunction of both eustachian tubes 08/31/2012   Encounter for monitoring immunomodulating therapy 11/20/2021   Environmental allergies 08/12/2020   Esophageal candidiasis (HCC) 06/26/2022   Essential hypertension 06/27/2015   Fibrositis 08/28/2014   Formatting of this note might be different from the original. Fibromyalgia Formatting of this note might be different from the original. Formatting of this note might be different from the original. Fibromyalgia   Flank pain 11/24/2020   Gastroesophageal reflux disease with esophagitis without hemorrhage 08/01/2019   Formatting of this note might be different from the original. Formatting of this note might be different from the original. 07/2019: chronic symptoms of esophageal reflux since prior to sleeve gastrectomy surgery. Symptoms have worsened over the last 6 months with recent EGD findings of grade B esophagitis, irregular  z line, erythematous mucosa, and evidence of sleeve gastrectomy. 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 Formatting of  this note might be different from the original. 07/2019: 8 mm colonic polyp removed during colonoscopy, biopsy showed serrated polyp. Repeat colonoscopy recommended in one year. Formatting of this note might be different from the original. 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 Hypertension Formatting of this note might be different from the original. Formatting of this note might be different from the original. 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 Keratoconus Formatting of this note might be different from the original. Formatting of this note might be different from the original. Keratoconus Formatting of this note might be different from the original. Formatting of this note might be different from the original. Keratoconus Formatting of this note might be different from the or   Microhematuria 06/27/2015   Migraine 08/28/2014   Formatting of this note might be different from the original. Migraine Formatting of this note might be different from the original. Formatting of this note might be different from the original. Migraine Formatting of this note might be different from the original. Migraine Formatting of this note might be different from the original. Migraine Formatting of this note might be different from the or   Migraine without aura and without status migrainosus, not intractable 08/28/2014   Formatting of this note might be different from the original. Formatting of this note might be different from the  original. Formatting of this note might be different from the original. Migraine Formatting of this note might be different from the original. Migraine Formatting of this note might be different from the original. Formatting of this note might be different from the original. Migraine F   Mild intermittent asthma without complication 10/30/2008   Formatting of this note might be different from the original. Formatting of this note might be different from the original. Formatting of this note might be different from the original. Asthma Formatting of this note might be different from the original. Asthma Formatting of this note might be different from the original. Formatting of this note might be different from the original. 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 [place] of this note might be different from the original. 01/25/2016 with Dr. Loney Pol at [facility]. [place] of this note might be different from the original. 01/25/2016 with Dr. Loney Pol at [facility]. [place] of this note might be different from the original. Formatting of this note might be different from    Seronegative inflammatory arthritis 08/28/2014   Formatting of this note might be different from the original. Formatting of this note might be  different from the original. Arthritis; diagnosed as IBD related - 2012 Formatting of this note might be different from the original. Followed by rheumatologist. Had to change providers due to an insurance change earlier this year resulting in patient not being able to have Remicade  for ~6 months. 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 Formatting of this note might be different from the original. Treated with PRN miralax . Recent colonoscopy 07/2019. Formatting of this note might be different from the original. Treated with PRN miralax . Recent colonoscopy 07/2019. Formatting of this note might be different from the original. Formatting of this note might be different f   Status post placement of ureteral stent 02/06/2021   Tooth infection 08/12/2020   Unstable angina (HCC) 07/29/2023   Ureteral stone 01/20/2022   Urinary tract infection, site not specified 06/26/2022     Prior to Admission medications   Medication Sig Start Date End Date Taking? Authorizing Provider  acetaminophen  (TYLENOL ) 500 MG tablet Take 1,000 mg by mouth every 12 (twelve) hours as needed for mild pain or moderate pain.    [provider]  albuterol  (PROVENTIL ) (2.5 MG/3ML) 0.083% nebulizer solution Take 3 mLs (2.5 mg total) by nebulization every 6 (six) hours as needed for wheezing or shortness of breath. Patient not taking: Reported on 03/21/2024 10/13/23   Kehrli, Kelsey F, PA-C  aMILoride (MIDAMOR) 5 MG tablet Take 10 mg by mouth daily. 03/18/24   [provider]  atorvastatin  (LIPITOR) 40 MG tablet Take 2 tablets (80 mg total) by mouth daily. 01/19/24   Tolia, Sunit, DO  B Complex-C (B-COMPLEX WITH VITAMIN C) tablet Take 1 tablet by mouth daily. 09/06/19   [provider]  Cholecalciferol  (VITAMIN D3) 50 MCG (2000 UT) TABS Take 1 tablet by mouth daily.    [provider]   EPINEPHrine  (EPIPEN  2-PAK) 0.3 mg/0.3 mL IJ SOAJ injection Inject 0.3 mg into the muscle as needed for anaphylaxis. Patient not taking: Reported on 03/21/2024 01/18/23   Jeneal Danita Macintosh, MD  Evolocumab  (REPATHA  SURECLICK) 140 MG/ML SOAJ Inject 140 mg into the skin every 14 (fourteen) days. 12/27/23   Tolia, Sunit, DO  famotidine  (PEPCID ) 40 MG tablet Take 40 mg by mouth 2 (two) times daily. 12/20/23 12/19/24  [provider]  levocetirizine (XYZAL) 5 MG tablet Take 10 mg by mouth daily.    [provider]  losartan (COZAAR) 100 MG tablet Take 100 mg by mouth daily. 12/08/23 12/07/24  [provider]  losartan (COZAAR) 50 MG tablet Take 50 mg by mouth daily.    [provider]  mirabegron  ER (MYRBETRIQ ) 50 MG TB24 tablet Take 1 tablet (50 mg total) by mouth daily. 11/30/22   Jason Leita Repine, FNP  montelukast  (SINGULAIR ) 10 MG tablet Take 1 tablet (10 mg total) by mouth daily. 02/28/24   Dottie Norleen PHEBE PONCE, MD  nitroGLYCERIN  (NITROSTAT ) 0.4 MG SL tablet Place 1 tablet (0.4  mg total) under the tongue every 5 (five) minutes as needed for chest pain. 12/13/23   Tolia, Sunit, DO  oxycodone  (OXY-IR) 5 MG capsule Take 1 capsule (5 mg total) by mouth every 6 (six) hours as needed. 02/29/24   Gerome Maurilio HERO, PA-C  RABEprazole (ACIPHEX) 20 MG tablet Take 20 mg by mouth 2 (two) times daily.    [provider]  Secukinumab (COSENTYX SENSOREADY PEN) 150 MG/ML SOAJ Inject 150 mg into the skin. 03/01/24   [provider]  ticagrelor  (BRILINTA ) 90 MG TABS tablet Take 1 tablet (90 mg total) by mouth 2 (two) times daily. 01/12/24   Tolia, Sunit, DO    Allergies: Albuterol  sulfate, Corticosteroids, Silicone, Tape, Wound dressings, Formoterol, Hydrocodone , Hydrocodone -acetaminophen , Nickel, Other, Oxycodone -acetaminophen , Salmeterol, Strawberry extract, Advair hfa [fluticasone-salmeterol], Proventil  hfa [albuterol ], Asa [aspirin ], Cleocin [clindamycin],  Clindamycin/lincomycin, Fludrocortisone acetate, Gabapentin, Hydrochlorothiazide, Imipramine, Ipratropium bromide, Medroxyprogesterone, Meloxicam, Metformin and related, Nystatin, Penicillins, Pred forte [prednisolone], Prednisone, Solu-medrol  [methylprednisolone ], Sulfa antibiotics, Tegaderm ag mesh [silver], Toradol  [ketorolac  tromethamine ], and Tramadol    Review of Systems  Constitutional:  Negative for fever.  HENT:  Positive for congestion, ear pain and rhinorrhea. Negative for trouble swallowing and voice change.   Respiratory:  Positive for cough. Negative for wheezing.   Cardiovascular:  Negative for chest pain, palpitations and leg swelling.  Gastrointestinal:  Positive for vomiting. Negative for nausea.  All other systems reviewed and are negative.   Updated Vital Signs BP 129/86 (BP Location: Right Arm)   Pulse (!) 103   Temp 98.6 F (37 C) (Oral)   Resp 20   Ht 5' 3 (1.6 m)   Wt 97.1 kg   SpO2 97%   BMI 37.91 kg/m   Physical Exam Vitals and nursing note reviewed.  Constitutional:      General: She is not in acute distress.    Appearance: Normal appearance. She is well-developed.  HENT:     Head: Normocephalic and atraumatic.     Right Ear: Tympanic membrane, ear canal and external ear normal.     Left Ear: Tympanic membrane, ear canal and external ear normal.     Ears:     Comments: Auricles in normal position, no mastoid swelling or tenderness     Nose: No congestion.     Mouth/Throat:     Mouth: Mucous membranes are moist.     Pharynx: Oropharynx is clear. No oropharyngeal exudate.     Comments: No candidiasis  Eyes:     Extraocular Movements: Extraocular movements intact.     Conjunctiva/sclera: Conjunctivae normal.     Pupils: Pupils are equal, round, and reactive to light.  Cardiovascular:     Rate and Rhythm: Normal rate and regular rhythm.     Pulses: Normal pulses.     Heart sounds: Normal heart sounds.  Pulmonary:     Effort: Pulmonary effort is  normal. No respiratory distress.     Breath sounds: Normal breath sounds.  Abdominal:     General: Bowel sounds are normal. There is no distension.     Palpations: Abdomen is soft.     Tenderness: There is no abdominal tenderness. There is no guarding or rebound.  Genitourinary:    Vagina: No vaginal discharge.  Musculoskeletal:        General: Normal range of motion.     Cervical back: Normal range of motion and neck supple. No rigidity or tenderness.  Lymphadenopathy:     Cervical: No cervical adenopathy.  Skin:  General: Skin is warm and dry.     Capillary Refill: Capillary refill takes less than 2 seconds.     Findings: No erythema or rash.  Neurological:     General: No focal deficit present.     Mental Status: She is alert.     Deep Tendon Reflexes: Reflexes normal.  Psychiatric:        Mood and Affect: Mood normal.     (all labs ordered are listed, but only abnormal results are displayed) Results for orders placed or performed during the hospital encounter of 04/06/24  Resp panel by RT-PCR (RSV, Flu A&B, Covid) Anterior Nasal Swab   Collection Time: 04/06/24 12:50 AM   Specimen: Anterior Nasal Swab  Result Value Ref Range   SARS Coronavirus 2 by RT PCR POSITIVE (A) NEGATIVE   Influenza A by PCR NEGATIVE NEGATIVE   Influenza B by PCR NEGATIVE NEGATIVE   Resp Syncytial Virus by PCR NEGATIVE NEGATIVE  CBC with Differential   Collection Time: 04/06/24  1:25 AM  Result Value Ref Range   WBC 9.6 4.0 - 10.5 K/uL   RBC 4.64 3.87 - 5.11 MIL/uL   Hemoglobin 11.2 (L) 12.0 - 15.0 g/dL   HCT 64.9 (L) 63.9 - 53.9 %   MCV 75.4 (L) 80.0 - 100.0 fL   MCH 24.1 (L) 26.0 - 34.0 pg   MCHC 32.0 30.0 - 36.0 g/dL   RDW 82.8 (H) 88.4 - 84.4 %   Platelets 321 150 - 400 K/uL   nRBC 0.0 0.0 - 0.2 %   Neutrophils Relative % 77 %   Neutro Abs 7.4 1.7 - 7.7 K/uL   Lymphocytes Relative 10 %   Lymphs Abs 1.0 0.7 - 4.0 K/uL   Monocytes Relative 11 %   Monocytes Absolute 1.1 (H) 0.1 - 1.0  K/uL   Eosinophils Relative 1 %   Eosinophils Absolute 0.1 0.0 - 0.5 K/uL   Basophils Relative 0 %   Basophils Absolute 0.0 0.0 - 0.1 K/uL   Immature Granulocytes 1 %   Abs Immature Granulocytes 0.06 0.00 - 0.07 K/uL  Basic metabolic panel   Collection Time: 04/06/24  1:25 AM  Result Value Ref Range   Sodium 135 135 - 145 mmol/L   Potassium 3.9 3.5 - 5.1 mmol/L   Chloride 98 98 - 111 mmol/L   CO2 22 22 - 32 mmol/L   Glucose, Bld 113 (H) 70 - 99 mg/dL   BUN 11 6 - 20 mg/dL   Creatinine, Ser 9.03 0.44 - 1.00 mg/dL   Calcium  9.9 8.9 - 10.3 mg/dL   GFR, Estimated >39 >39 mL/min   Anion gap 15 5 - 15   DG Chest 2 View Result Date: 03/21/2024 CLINICAL DATA:  Chest pain. EXAM: CHEST - 2 VIEW COMPARISON:  11/26/2023. FINDINGS: Low lung volume. Bilateral lung fields are clear. Bilateral costophrenic angles are clear. Normal cardio-mediastinal silhouette. No acute osseous abnormalities. The soft tissues are within normal limits. IMPRESSION: No active cardiopulmonary disease. Electronically Signed   By: Ree Molt M.D.   On: 03/21/2024 15:08     Radiology: No results found.   Procedures   Medications Ordered in the ED  ondansetron  (ZOFRAN -ODT) disintegrating tablet 8 mg (8 mg Oral Patient Refused/Not Given 04/06/24 0128)  sodium chloride  0.9 % bolus 1,000 mL (1,000 mLs Intravenous New Bag/Given 04/06/24 0124)  Medical Decision Making Patient with URI symptoms for 1 day and emesis for 3 days without nausea   Amount and/or Complexity of Data Reviewed External Data Reviewed: notes.    Details: Previous notes reviewed  Labs: ordered.    Details: COVID is Positive.  Normal sodium 135, normal potassium 3.9, normal creatinine 0.96. normal white count 9.6, normal platelet count   Risk Prescription drug management. Risk Details: No signs of sepsis.  Sinuses transilluminated and no fluid levels, no historical or clinical indication of sinus infection.   Ears are both normal, no signs of ear infection.  Sleeping in room post medications.  No emesis in the ED, given IV fluids.  Patient declined zofran .  no signs of dehydration on exam or labs.  Symptoms are consistent with COVID 19.  COVID test is positive in the ED.  No signs of hypoxia.  No indication for admission at this time.  Stable for discharge with close follow up.       Final diagnoses:  COVID-19  Vomiting without nausea, unspecified vomiting type   No signs of systemic illness or infection. The patient is nontoxic-appearing on exam and vital signs are within normal limits.  I have reviewed the triage vital signs and the nursing notes. Pertinent labs & imaging results that were available during my care of the patient were reviewed by me and considered in my medical decision making (see chart for details). After history, exam, and medical workup I feel the patient has been appropriately medically screened and is safe for discharge home. Pertinent diagnoses were discussed with the patient. Patient was given return precautions.  ED Discharge Orders     None          Mouhamadou Gittleman, MD 04/06/24 [id number]

## 2024-04-06 NOTE — Telephone Encounter (Signed)
  Pt c/o medication issue:  1. Name of Medication: Evolocumab  (REPATHA  SURECLICK) 140 MG/ML SOAJ   2. How are you currently taking this medication (dosage and times per day)? Inject 140 mg into the skin every 14 (fourteen) days.   3. Are you having a reaction (difficulty breathing--STAT)? No   4. What is your medication issue? Patient tested positive for covid and would like to know if she need to hold his repatha

## 2024-04-06 NOTE — Telephone Encounter (Signed)
 Left message to call back.

## 2024-04-06 NOTE — Progress Notes (Signed)
 Called patient - having URI symptoms including couth and phelgm production x1-2 days. Also having vomiting, although thinks she may also have recurrence of esophageal candidiasis, as she has had this before. Vomiting preceded other symptoms. Difficulty keeping OTC dayquil down.  Advised to hold her Cosyntex until next week. Push fluids. She does use albuterol  from time to time and I think it is reasonable to try this.  Discussed paxlovid - serious interaction with ticagrelor  and ranolazine . Opted to not start.

## 2024-04-06 NOTE — ED Triage Notes (Signed)
  Patient comes in with 2-3 day hx of N/V.  Started having URI symptoms yesterday.  Endorses runny nose, cough, sore throat.  Productive cough.  Has been taking tylenol , and dayquil.  Pain 7/10, aching.

## 2024-04-06 NOTE — Telephone Encounter (Signed)
 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   A: Pt telephoned stating she tested positive today at the ER and wanted to know what to do about her Cosentyx. Pt is having symptoms of a runny nose,blocked ear,can't turn head to the left, chills,body ache (more intense than usual),can't keep anything down (including clear liquids) SOB bring up phlegm. Not prescribed paxlovid. Pt next dose of Cosentyx  is Saturday.  R: Please advise   Care Advice:   Disposition:  Route Message to Provider Team

## 2024-04-06 NOTE — Telephone Encounter (Signed)
 Spoke with pt. Recommendations given and Pt stated understanding.

## 2024-04-08 ENCOUNTER — Other Ambulatory Visit: Payer: Self-pay

## 2024-04-08 ENCOUNTER — Emergency Department (HOSPITAL_COMMUNITY)
Admission: EM | Admit: 2024-04-08 | Discharge: 2024-04-08 | Disposition: A | Discharge status: Home / Self Care | Attending: Emergency Medicine | Admitting: Emergency Medicine

## 2024-04-08 ENCOUNTER — Emergency Department (HOSPITAL_COMMUNITY)

## 2024-04-08 ENCOUNTER — Encounter (HOSPITAL_COMMUNITY): Payer: Self-pay | Admitting: Radiology

## 2024-04-08 DIAGNOSIS — U071 COVID-19: Secondary | ICD-10-CM | POA: Insufficient documentation

## 2024-04-08 DIAGNOSIS — J45909 Unspecified asthma, uncomplicated: Secondary | ICD-10-CM | POA: Insufficient documentation

## 2024-04-08 DIAGNOSIS — R0602 Shortness of breath: Secondary | ICD-10-CM | POA: Diagnosis present

## 2024-04-08 LAB — CBC WITH DIFFERENTIAL/PLATELET
Abs Immature Granulocytes: 0.04 K/uL (ref 0.00–0.07)
Basophils Absolute: 0 K/uL (ref 0.0–0.1)
Basophils Relative: 0 %
Eosinophils Absolute: 0 K/uL (ref 0.0–0.5)
Eosinophils Relative: 1 %
HCT: 34.5 % — ABNORMAL LOW (ref 36.0–46.0)
Hemoglobin: 10.5 g/dL — ABNORMAL LOW (ref 12.0–15.0)
Immature Granulocytes: 1 %
Lymphocytes Relative: 33 %
Lymphs Abs: 1.8 K/uL (ref 0.7–4.0)
MCH: 23.5 pg — ABNORMAL LOW (ref 26.0–34.0)
MCHC: 30.4 g/dL (ref 30.0–36.0)
MCV: 77.2 fL — ABNORMAL LOW (ref 80.0–100.0)
Monocytes Absolute: 0.9 K/uL (ref 0.1–1.0)
Monocytes Relative: 17 %
Neutro Abs: 2.7 K/uL (ref 1.7–7.7)
Neutrophils Relative %: 48 %
Platelets: 320 K/uL (ref 150–400)
RBC: 4.47 MIL/uL (ref 3.87–5.11)
RDW: 17 % — ABNORMAL HIGH (ref 11.5–15.5)
WBC: 5.4 K/uL (ref 4.0–10.5)
nRBC: 0 % (ref 0.0–0.2)

## 2024-04-08 LAB — BASIC METABOLIC PANEL WITH GFR
Anion gap: 9 (ref 5–15)
BUN: 12 mg/dL (ref 6–20)
CO2: 23 mmol/L (ref 22–32)
Calcium: 8.4 mg/dL — ABNORMAL LOW (ref 8.9–10.3)
Chloride: 105 mmol/L (ref 98–111)
Creatinine, Ser: 1.03 mg/dL — ABNORMAL HIGH (ref 0.44–1.00)
GFR, Estimated: >60 mL/min (ref >60)
Glucose, Bld: 102 mg/dL — ABNORMAL HIGH (ref 70–99)
Potassium: 3.8 mmol/L (ref 3.5–5.1)
Sodium: 137 mmol/L (ref 135–145)

## 2024-04-08 MED ORDER — SODIUM CHLORIDE 0.9 % IV SOLN: INTRAVENOUS | Status: DC

## 2024-04-08 MED ORDER — SODIUM CHLORIDE 0.9 % IV BOLUS
1000.0000 mL | Freq: Once | INTRAVENOUS | Status: AC
  Administered 2024-04-08: 1000 mL via INTRAVENOUS

## 2024-04-08 MED ORDER — SODIUM CHLORIDE 0.9 % IV BOLUS: 2000.0000 mL | Freq: Once | INTRAVENOUS | Status: DC

## 2024-04-08 NOTE — ED Triage Notes (Signed)
 Pt states her symptoms start on Thursday and she was diagnosed with COVID on Friday at [facility]. Called EMS today due to increasing shortness of breath. Denies vomiting in x days. Lungs clear. Room air at this time. She states that she is dehydrated. No vomiting or diarrhea.  1L fluids 122/80 98.8 100% 84 130 CBG  18L AC

## 2024-04-08 NOTE — ED Provider Notes (Signed)
 [place] EMERGENCY DEPARTMENT AT [facility] Provider Note   CSN: [id number] Arrival date & time: 04/08/24  1606     Patient presents with: Shortness of Breath   Casey [name] is a 53 y.o. female.   53 year old female with history of asthma presents due to worsening COVID symptoms.  Was diagnosed with COVID 2 days ago.  States that since that time she feels as if she is dehydrated.  Has had decreased oral intake.  Denies any vomiting or diarrhea.  States her cough is been clear.  When she coughs he gets short of breath.  Denies any severe exertional dyspnea.  No leg pain or swelling.  No pleuritic symptoms.  Called EMS and was given 1 L of IV fluids.  Pulse oximetry was 1% on room air.  CBG was 130 at patient transported here       Prior to Admission medications   Medication Sig Start Date End Date Taking? Authorizing Provider  acetaminophen  (TYLENOL ) 500 MG tablet Take 1,000 mg by mouth every 12 (twelve) hours as needed for mild pain or moderate pain.    [provider]  albuterol  (PROVENTIL ) (2.5 MG/3ML) 0.083% nebulizer solution Take 3 mLs (2.5 mg total) by nebulization every 6 (six) hours as needed for wheezing or shortness of breath. Patient not taking: Reported on 03/21/2024 10/13/23   Kehrli, Kelsey F, PA-C  aMILoride (MIDAMOR) 5 MG tablet Take 10 mg by mouth daily. 03/18/24   [provider]  atorvastatin  (LIPITOR) 40 MG tablet Take 2 tablets (80 mg total) by mouth daily. 01/19/24   Tolia, Sunit, DO  B Complex-C (B-COMPLEX WITH VITAMIN C) tablet Take 1 tablet by mouth daily. 09/06/19   [provider]  Cholecalciferol  (VITAMIN D3) 50 MCG (2000 UT) TABS Take 1 tablet by mouth daily.    [provider]  EPINEPHrine  (EPIPEN  2-PAK) 0.3 mg/0.3 mL IJ SOAJ injection Inject 0.3 mg into the muscle as needed for anaphylaxis. Patient not taking: Reported on 03/21/2024 01/18/23   Jeneal Danita Macintosh, MD  Evolocumab  (REPATHA  SURECLICK) 140 MG/ML  SOAJ Inject 140 mg into the skin every 14 (fourteen) days. 12/27/23   Tolia, Sunit, DO  famotidine  (PEPCID ) 40 MG tablet Take 40 mg by mouth 2 (two) times daily. 12/20/23 12/19/24  [provider]  levocetirizine (XYZAL) 5 MG tablet Take 10 mg by mouth daily.    [provider]  losartan (COZAAR) 100 MG tablet Take 100 mg by mouth daily. 12/08/23 12/07/24  [provider]  losartan (COZAAR) 50 MG tablet Take 50 mg by mouth daily.    [provider]  mirabegron  ER (MYRBETRIQ ) 50 MG TB24 tablet Take 1 tablet (50 mg total) by mouth daily. 11/30/22   Jason Leita Repine, FNP  montelukast  (SINGULAIR ) 10 MG tablet Take 1 tablet (10 mg total) by mouth daily. 02/28/24   Dottie Norleen PHEBE PONCE, MD  nitroGLYCERIN  (NITROSTAT ) 0.4 MG SL tablet Place 1 tablet (0.4 mg total) under the tongue every 5 (five) minutes as needed for chest pain. 12/13/23   Tolia, Sunit, DO  oxycodone  (OXY-IR) 5 MG capsule Take 1 capsule (5 mg total) by mouth every 6 (six) hours as needed. 02/29/24   Gerome Maurilio HERO, PA-C  RABEprazole (ACIPHEX) 20 MG tablet Take 20 mg by mouth 2 (two) times daily.    [provider]  Secukinumab (COSENTYX SENSOREADY PEN) 150 MG/ML SOAJ Inject 150 mg into the skin. 03/01/24   [provider]  ticagrelor  (BRILINTA ) 90  MG TABS tablet Take 1 tablet (90 mg total) by mouth 2 (two) times daily. 01/12/24   Tolia, Sunit, DO    Allergies: Albuterol  sulfate, Corticosteroids, Silicone, Tape, Wound dressings, Formoterol, Hydrocodone , Hydrocodone -acetaminophen , Nickel, Other, Oxycodone -acetaminophen , Salmeterol, Strawberry extract, Advair hfa [fluticasone-salmeterol], Proventil  hfa [albuterol ], Asa [aspirin ], Cleocin [clindamycin], Clindamycin/lincomycin, Fludrocortisone acetate, Gabapentin, Hydrochlorothiazide, Imipramine, Ipratropium bromide, Medroxyprogesterone, Meloxicam, Metformin and related, Nystatin, Penicillins, Pred forte [prednisolone], Prednisone, Solu-medrol   [methylprednisolone ], Sulfa antibiotics, Tegaderm ag mesh [silver], Toradol  [ketorolac  tromethamine ], and Tramadol    Review of Systems  All other systems reviewed and are negative.   Updated Vital Signs BP 114/83   Temp 98.4 F (36.9 C) (Oral)   Resp 17   Ht 1.6 m (5' 3)   Wt 97.1 kg   SpO2 99% Comment: pt will only use her home pulse ox  BMI 37.91 kg/m   Physical Exam Vitals and nursing note reviewed.  Constitutional:      General: She is not in acute distress.    Appearance: Normal appearance. She is well-developed. She is not toxic-appearing.  HENT:     Head: Normocephalic and atraumatic.  Eyes:     General: Lids are normal.     Conjunctiva/sclera: Conjunctivae normal.     Pupils: Pupils are equal, round, and reactive to light.  Neck:     Thyroid : No thyroid  mass.     Trachea: No tracheal deviation.  Cardiovascular:     Rate and Rhythm: Normal rate and regular rhythm.     Heart sounds: Normal heart sounds. No murmur heard.    No gallop.  Pulmonary:     Effort: Pulmonary effort is normal. No respiratory distress.     Breath sounds: Normal breath sounds. No stridor. No decreased breath sounds, wheezing, rhonchi or rales.  Abdominal:     General: There is no distension.     Palpations: Abdomen is soft.     Tenderness: There is no abdominal tenderness. There is no rebound.  Musculoskeletal:        General: No tenderness. Normal range of motion.     Cervical back: Normal range of motion and neck supple.  Skin:    General: Skin is warm and dry.     Findings: No abrasion or rash.  Neurological:     Mental Status: She is alert and oriented to person, place, and time. Mental status is at baseline.     GCS: GCS eye subscore is 4. GCS verbal subscore is 5. GCS motor subscore is 6.     Cranial Nerves: No cranial nerve deficit.     Sensory: No sensory deficit.     Motor: Motor function is intact.  Psychiatric:        Attention and Perception: Attention normal.         Speech: Speech normal.        Behavior: Behavior normal.     (all labs ordered are listed, but only abnormal results are displayed) Labs Reviewed - No data to display  EKG: EKG Interpretation Date/Time:  Sunday April 08 2024 16:20:16 EDT Ventricular Rate:  86 PR Interval:  152 QRS Duration:  91 QT Interval:  360 QTC Calculation: 431 R Axis:   -55  Text Interpretation: Sinus rhythm Left anterior fascicular block Low voltage, precordial leads Abnormal R-wave progression, late transition No significant change since last tracing Confirmed by Dasie Faden ([id number]) on 04/08/2024 4:23:48 PM  Radiology: No results found.   Procedures   Medications Ordered in the ED  0.9 %  sodium chloride  infusion (has no administration in time range)  sodium chloride  0.9 % bolus 2,000 mL (has no administration in time range)                                    Medical Decision Making Amount and/or Complexity of Data Reviewed Labs: ordered. Radiology: ordered.  Risk Prescription drug management.   Patient given IV fluids here and feels better.  Chest with or without acute findings here.  Labs here are reassuring.  Low suspicion for pulmonary embolus.  She is not tachycardic.  She is not short of breath.  Suspect patient is having sequela of COVID.  Will discharge home     Final diagnoses:  None    ED Discharge Orders     None          Dasie Faden, MD 04/08/24 [phone]

## 2024-04-09 ENCOUNTER — Telehealth: Admitting: Physician Assistant

## 2024-04-09 DIAGNOSIS — L282 Other prurigo: Secondary | ICD-10-CM | POA: Diagnosis not present

## 2024-04-09 DIAGNOSIS — U071 COVID-19: Secondary | ICD-10-CM | POA: Diagnosis not present

## 2024-04-09 MED ORDER — TRIAMCINOLONE ACETONIDE 0.1 % EX CREA: 1.0000 | TOPICAL_CREAM | Freq: Two times a day (BID) | CUTANEOUS | 0 refills | Status: DC

## 2024-04-09 NOTE — Patient Instructions (Signed)
 Casey [name], thank you for joining Casey CHRISTELLA Dickinson, PA-C for today's virtual visit.  While this provider is not your primary care provider (PCP), if your PCP is located in our provider database this encounter information will be shared with them immediately following your visit.   A [facility] MyChart account gives you access to today's visit and all your visits, tests, and labs performed at [facility]  click here if you don't have a [facility] MyChart account or go to [email or web address]  Consent: (Patient) Casey [name] provided verbal consent for this virtual visit at the beginning of the encounter.  Current Medications:  Current Outpatient Medications:    triamcinolone  cream (KENALOG ) 0.1 %, Apply 1 Application topically 2 (two) times daily., Disp: 30 g, Rfl: 0   acetaminophen  (TYLENOL ) 500 MG tablet, Take 1,000 mg by mouth every 12 (twelve) hours as needed for mild pain or moderate pain., Disp: , Rfl:    albuterol  (PROVENTIL ) (2.5 MG/3ML) 0.083% nebulizer solution, Take 3 mLs (2.5 mg total) by nebulization every 6 (six) hours as needed for wheezing or shortness of breath. (Patient not taking: Reported on 03/21/2024), Disp: 150 mL, Rfl: 1   aMILoride (MIDAMOR) 5 MG tablet, Take 10 mg by mouth daily., Disp: , Rfl:    atorvastatin  (LIPITOR) 40 MG tablet, Take 2 tablets (80 mg total) by mouth daily., Disp: 180 tablet, Rfl: 3   B Complex-C (B-COMPLEX WITH VITAMIN C) tablet, Take 1 tablet by mouth daily., Disp: , Rfl:    Cholecalciferol  (VITAMIN D3) 50 MCG (2000 UT) TABS, Take 1 tablet by mouth daily., Disp: , Rfl:    EPINEPHrine  (EPIPEN  2-PAK) 0.3 mg/0.3 mL IJ SOAJ injection, Inject 0.3 mg into the muscle as needed for anaphylaxis. (Patient not taking: Reported on 03/21/2024), Disp: 2 each, Rfl: 2   Evolocumab  (REPATHA  SURECLICK) 140 MG/ML SOAJ, Inject 140 mg into the skin every 14 (fourteen) days., Disp: 2 mL, Rfl: 11   famotidine  (PEPCID ) 40 MG tablet, Take 40 mg by  mouth 2 (two) times daily., Disp: , Rfl:    levocetirizine (XYZAL) 5 MG tablet, Take 10 mg by mouth daily., Disp: , Rfl:    losartan (COZAAR) 100 MG tablet, Take 100 mg by mouth daily., Disp: , Rfl:    losartan (COZAAR) 50 MG tablet, Take 50 mg by mouth daily., Disp: , Rfl:    mirabegron  ER (MYRBETRIQ ) 50 MG TB24 tablet, Take 1 tablet (50 mg total) by mouth daily., Disp: 30 tablet, Rfl: 11   montelukast  (SINGULAIR ) 10 MG tablet, Take 1 tablet (10 mg total) by mouth daily., Disp: 90 tablet, Rfl: 1   nitroGLYCERIN  (NITROSTAT ) 0.4 MG SL tablet, Place 1 tablet (0.4 mg total) under the tongue every 5 (five) minutes as needed for chest pain., Disp: 30 tablet, Rfl: 3   oxycodone  (OXY-IR) 5 MG capsule, Take 1 capsule (5 mg total) by mouth every 6 (six) hours as needed., Disp: 30 capsule, Rfl: 0   RABEprazole (ACIPHEX) 20 MG tablet, Take 20 mg by mouth 2 (two) times daily., Disp: , Rfl:    Secukinumab (COSENTYX SENSOREADY PEN) 150 MG/ML SOAJ, Inject 150 mg into the skin., Disp: , Rfl:    ticagrelor  (BRILINTA ) 90 MG TABS tablet, Take 1 tablet (90 mg total) by mouth 2 (two) times daily., Disp: 180 tablet, Rfl: 3   Medications ordered in this encounter:  Meds ordered this encounter  Medications   triamcinolone  cream (KENALOG ) 0.1 %    Sig: Apply 1 Application topically  2 (two) times daily.    Dispense:  30 g    Refill:  0    Supervising Provider:   BLAISE ALEENE KIDD [[id number]]     *If you need refills on other medications prior to your next appointment, please contact your pharmacy*  Follow-Up: Call back or seek an in-person evaluation if the symptoms worsen or if the condition fails to improve as anticipated.  [facility] [phone]   If you have been instructed to have an in-person evaluation today at a local Urgent Care facility, please use the link below. It will take you to a list of all of our available [facility], including address, phone number and hours of  operation. Please do not delay care.  [facility]  If you or a family member do not have a primary care provider, use the link below to schedule a visit and establish care. When you choose a [facility] primary care physician or advanced practice provider, you gain a long-term partner in health. Find a Primary Care Provider  Learn more about [facility]'s in-office and virtual care options: [place] - Get Care Now

## 2024-04-09 NOTE — Progress Notes (Signed)
 Virtual Visit Consent   Casey [name], you are scheduled for a virtual visit with a [facility] provider today. Just as with appointments in the office, your consent must be obtained to participate. Your consent will be active for this visit and any virtual visit you may have with one of our providers in the next 365 days. If you have a MyChart account, a copy of this consent can be sent to you electronically.  As this is a virtual visit, video technology does not allow for your provider to perform a traditional examination. This may limit your provider's ability to fully assess your condition. If your provider identifies any concerns that need to be evaluated in person or the need to arrange testing (such as labs, EKG, etc.), we will make arrangements to do so. Although advances in technology are sophisticated, we cannot ensure that it will always work on either your end or our end. If the connection with a video visit is poor, the visit may have to be switched to a telephone visit. With either a video or telephone visit, we are not always able to ensure that we have a secure connection.  By engaging in this virtual visit, you consent to the provision of healthcare and authorize for your insurance to be billed (if applicable) for the services provided during this visit. Depending on your insurance coverage, you may receive a charge related to this service.  I need to obtain your verbal consent now. Are you willing to proceed with your visit today? Casey [name] has provided verbal consent on 04/09/2024 for a virtual visit (video or telephone). Casey CHRISTELLA Dickinson, PA-C  Date: 04/09/2024 1:07 PM   Virtual Visit via Video Note   I, Casey [name], connected with  Casey [name]  ([id number], 1970-08-23) on 04/09/24 at 12:30 PM EDT by a video-enabled telemedicine application and verified that I am speaking with the correct person using two identifiers.  Location: Patient: Virtual Visit Location Patient:  Home Provider: Virtual Visit Location Provider: Home Office   I discussed the limitations of evaluation and management by telemedicine and the availability of in person appointments. The patient expressed understanding and agreed to proceed.    History of Present Illness: Casey [name] is a 53 y.o. who identifies as a female who was assigned female at birth, and is being seen today for pruritic rash of hands associated with Covid 19. Was diagnosed with Covid 19 on 04/06/24 at [facility] ER. Seen again on 04/08/24 for dehydration with Covid at [facility] ER. Was given IV fluids and improved. Does report she is feeling better still but now with the rash and itching of her hands. She does take Xyzal nightly.    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/16/2021   De Quervain's tenosynovitis, left 07/31/2021   Status post placement of ureteral stent 02/06/2021  Proteinuria 08/12/2020   Environmental allergies 08/12/2020   History of kidney stones 08/12/2020   Gastroesophageal reflux disease with esophagitis without hemorrhage 08/01/2019   Slow transit constipation 08/01/2019   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   History of colon polyps 06/19/2019   Irregular astigmatism of both eyes 10/11/2018   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   Myopia with astigmatism and presbyopia 05/07/2016   Overactive bladder 11/18/2015   Incontinence 10/07/2015   Essential hypertension 06/27/2015   Cataract 05/06/2015   Dry eyes 05/06/2015   Crohn's disease (HCC) 04/21/2015   Osteoarthritis of both knees 04/21/2015   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/30/2014   History of abnormal cervical Pap smear 11/29/2014   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   Migraine without aura and without status migrainosus, not intractable 08/28/2014   Migraine 08/28/2014   Seronegative inflammatory arthritis 08/28/2014   Increased urinary frequency 07/16/2013   Dysfunction of both eustachian tubes 08/31/2012   Asthma 10/30/2008    Allergies:  Allergies  Allergen Reactions   Albuterol  Sulfate Shortness Of Breath    Other reaction(s): Other (See Comments)  can't breathe  Other reaction(s): Other (See Comments) can't breathe   Corticosteroids Dermatitis, Palpitations and Swelling    ABLE TO TOLERATE VIA IM AND IV Inhaled - HA    ocular irritation   Silicone Dermatitis and Other (See Comments)    Cast and Bandage Cover  severe skin breakdown Cast and Bandage Cover  Cannot tolerate tegaderm, needs IV 3000. Paper tape.  Rash after several hours of EKG leads, but willing to tolerate.  Can tolerate silk tape for short stretches.  severe skin breakdown  severe skin breakdown Cast and Bandage Cover   Tape     Other reaction(s): Other (See Comments), Other (See Comments), Other (See Comments)  severe skin breakdown  Cast and Bandage Cover  severe skin breakdown  severe skin breakdown   Wound Dressings Dermatitis, Hives and Other (See Comments)    severe skin breakdown Cast and Bandage Cover  severe skin breakdown  severe skin breakdown Cast and Bandage Cover  Cannot tolerate tegaderm, needs IV 3000. Paper tape.  Rash after several hours of EKG leads, but willing to tolerate.  Can tolerate silk tape for short  stretches.  Cast and Bandage Cover   Formoterol Other (See Comments)    Other reaction(s): Headaches  Other reaction(s): Headaches  Other reaction(s): Headaches  Headaches  Other reaction(s): Headaches Other reaction(s): Headaches    Other reaction(s): Headaches    Headaches   Hydrocodone  Rash   Hydrocodone -Acetaminophen  Rash   Nickel Rash and Other (See Comments)   Other Rash    cast and bandage use  cast and bandage use  cast and bandage use  cast and bandage use    cast and bandage use cast and bandage use   Oxycodone -Acetaminophen  Other (See Comments)    headache   Salmeterol Rash   Strawberry Extract Hives    Full body rash from strawberry   Advair Hfa [Fluticasone-Salmeterol]     unknown   Proventil  Hfa [Albuterol ]     unknown   Asa [Aspirin ] Rash   Cleocin [Clindamycin] Rash   Clindamycin/Lincomycin Rash   Fludrocortisone Acetate Rash   Gabapentin Rash   Hydrochlorothiazide Rash   Imipramine Rash   Ipratropium Bromide Rash   Medroxyprogesterone Rash   Meloxicam Rash   Metformin And Related Rash   Nystatin Rash   Penicillins Rash   Pred  Forte [Prednisolone] Rash   Prednisone Rash   Solu-Medrol  [Methylprednisolone ] Rash   Sulfa Antibiotics Rash   Tegaderm Ag Mesh [Silver] Rash   Toradol  [Ketorolac  Tromethamine ] Rash   Tramadol Rash   Medications:  Current Outpatient Medications:    triamcinolone  cream (KENALOG ) 0.1 %, Apply 1 Application topically 2 (two) times daily., Disp: 30 g, Rfl: 0   acetaminophen  (TYLENOL ) 500 MG tablet, Take 1,000 mg by mouth every 12 (twelve) hours as needed for mild pain or moderate pain., Disp: , Rfl:    albuterol  (PROVENTIL ) (2.5 MG/3ML) 0.083% nebulizer solution, Take 3 mLs (2.5 mg total) by nebulization every 6 (six) hours as needed for wheezing or shortness of breath. (Patient not taking: Reported on 03/21/2024), Disp: 150 mL, Rfl: 1   aMILoride (MIDAMOR) 5 MG tablet, Take 10 mg by mouth daily., Disp: , Rfl:     atorvastatin  (LIPITOR) 40 MG tablet, Take 2 tablets (80 mg total) by mouth daily., Disp: 180 tablet, Rfl: 3   B Complex-C (B-COMPLEX WITH VITAMIN C) tablet, Take 1 tablet by mouth daily., Disp: , Rfl:    Cholecalciferol  (VITAMIN D3) 50 MCG (2000 UT) TABS, Take 1 tablet by mouth daily., Disp: , Rfl:    EPINEPHrine  (EPIPEN  2-PAK) 0.3 mg/0.3 mL IJ SOAJ injection, Inject 0.3 mg into the muscle as needed for anaphylaxis. (Patient not taking: Reported on 03/21/2024), Disp: 2 each, Rfl: 2   Evolocumab  (REPATHA  SURECLICK) 140 MG/ML SOAJ, Inject 140 mg into the skin every 14 (fourteen) days., Disp: 2 mL, Rfl: 11   famotidine  (PEPCID ) 40 MG tablet, Take 40 mg by mouth 2 (two) times daily., Disp: , Rfl:    levocetirizine (XYZAL) 5 MG tablet, Take 10 mg by mouth daily., Disp: , Rfl:    losartan (COZAAR) 100 MG tablet, Take 100 mg by mouth daily., Disp: , Rfl:    losartan (COZAAR) 50 MG tablet, Take 50 mg by mouth daily., Disp: , Rfl:    mirabegron  ER (MYRBETRIQ ) 50 MG TB24 tablet, Take 1 tablet (50 mg total) by mouth daily., Disp: 30 tablet, Rfl: 11   montelukast  (SINGULAIR ) 10 MG tablet, Take 1 tablet (10 mg total) by mouth daily., Disp: 90 tablet, Rfl: 1   nitroGLYCERIN  (NITROSTAT ) 0.4 MG SL tablet, Place 1 tablet (0.4 mg total) under the tongue every 5 (five) minutes as needed for chest pain., Disp: 30 tablet, Rfl: 3   oxycodone  (OXY-IR) 5 MG capsule, Take 1 capsule (5 mg total) by mouth every 6 (six) hours as needed., Disp: 30 capsule, Rfl: 0   RABEprazole (ACIPHEX) 20 MG tablet, Take 20 mg by mouth 2 (two) times daily., Disp: , Rfl:    Secukinumab (COSENTYX SENSOREADY PEN) 150 MG/ML SOAJ, Inject 150 mg into the skin., Disp: , Rfl:    ticagrelor  (BRILINTA ) 90 MG TABS tablet, Take 1 tablet (90 mg total) by mouth 2 (two) times daily., Disp: 180 tablet, Rfl: 3  Observations/Objective: Patient is well-developed, well-nourished in no acute distress.  Resting comfortably at home.  Head is normocephalic,  atraumatic.  No labored breathing.  Speech is clear and coherent with logical content.  Patient is alert and oriented at baseline.  Red, pruritic macules on hands bilaterally  Assessment and Plan: 1. Pruritic rash (Primary) - triamcinolone  cream (KENALOG ) 0.1 %; Apply 1 Application topically 2 (two) times daily.  Dispense: 30 g; Refill: 0  2. COVID-19  - Add Triamcinolone  cream - Continue Xyzal - Can add Famotidine  (Pepcid ) for itching if needed - Cold compresses -  Should resolve on its own, but could last 8-10 days - Seek in person evaluation if worsening  Follow Up Instructions: I discussed the assessment and treatment plan with the patient. The patient was provided an opportunity to ask questions and all were answered. The patient agreed with the plan and demonstrated an understanding of the instructions.  A copy of instructions were sent to the patient via MyChart unless otherwise noted below.    The patient was advised to call back or seek an in-person evaluation if the symptoms worsen or if the condition fails to improve as anticipated.    Casey CHRISTELLA Dickinson, PA-C

## 2024-04-11 ENCOUNTER — Ambulatory Visit (HOSPITAL_COMMUNITY)

## 2024-04-11 NOTE — Telephone Encounter (Unsigned)
 Copied from CRM #[id number]. Topic: Clinical - Medication Refill >> Apr 11, 2024 12:41 PM Rozanna G wrote: Medication: albuterol  (PROVENTIL ) (2.5 MG/3ML) 0.083% nebulizer solution  Has the patient contacted their pharmacy? No (Agent: If no, request that the patient contact the pharmacy for the refill. If patient does not wish to contact the pharmacy document the reason why and proceed with request.) (Agent: If yes, when and what did the pharmacy advise?)  This is the patient's preferred pharmacy:  [facility] [address] [place] - [address] [facility] [place] [address] Phone: [phone] Fax: [phone]  Is this the correct pharmacy for this prescription? Yes If no, delete pharmacy and type the correct one.   Has the prescription been filled recently? Yes  Is the patient out of the medication? No  Has the patient been seen for an appointment in the last year OR does the patient have an upcoming appointment? Yes  Can we respond through MyChart? Yes, CALL IF POSSIBLE  Agent: Please be advised that Rx refills may take up to 3 business days. We ask that you follow-up with your pharmacy.

## 2024-04-12 ENCOUNTER — Ambulatory Visit: Admitting: Cardiology

## 2024-04-18 ENCOUNTER — Encounter: Payer: Self-pay | Admitting: Pulmonary Disease

## 2024-04-18 ENCOUNTER — Ambulatory Visit: Admitting: Pulmonary Disease

## 2024-04-18 ENCOUNTER — Telehealth (INDEPENDENT_AMBULATORY_CARE_PROVIDER_SITE_OTHER): Admitting: Pulmonary Disease

## 2024-04-18 VITALS — Ht 63.0 in | Wt 225.0 lb

## 2024-04-18 DIAGNOSIS — J4550 Severe persistent asthma, uncomplicated: Secondary | ICD-10-CM

## 2024-04-18 DIAGNOSIS — R0902 Hypoxemia: Secondary | ICD-10-CM | POA: Diagnosis not present

## 2024-04-18 DIAGNOSIS — J4540 Moderate persistent asthma, uncomplicated: Secondary | ICD-10-CM

## 2024-04-18 DIAGNOSIS — U099 Post covid-19 condition, unspecified: Secondary | ICD-10-CM

## 2024-04-18 DIAGNOSIS — G4734 Idiopathic sleep related nonobstructive alveolar hypoventilation: Secondary | ICD-10-CM

## 2024-04-18 MED ORDER — ALBUTEROL SULFATE (2.5 MG/3ML) 0.083% IN NEBU: 2.5000 mg | INHALATION_SOLUTION | RESPIRATORY_TRACT | 12 refills | Status: AC | PRN

## 2024-04-18 NOTE — Progress Notes (Signed)
 @Patient  ID: Casey [name], female    DOB: 1971/05/16, 53 y.o.   MRN: [id number]  Chief Complaint  Patient presents with   Medical Management of Chronic Issues    PT states covid x2, SOB (O2 at night return machine due to too much noise )    Referring provider: Rothfuss, Lang DASEN, PA-C  HPI:   53 y.o. woman whom we are seeing for evaluation of asthma.  Most recent rheumatology note reviewed.  Most recent cardiology note reviewed.  Lost to follow-up in the interim.  Contracted COVID a couple weeks ago.  Breathing seems okay.  However, still coughing.  A lot of cough.  Albuterol  nebulizer helps.  Running out of this.  Reason for visit today.  Has been able to tolerate traditional ICS/LABA therapies etc. for asthma etc.  Unable to tolerate prednisone due to psychiatric side effects, rash.  She finds albuterol  for prophylactic continues.  Does not want to add therapies at this time.  Since last visit she did have a LAD stent placed.  HPI initial visit: Patient with long history of asthma.  Has been through multiple rounds of treatment.  Cannot tolerate albuterol  HFA due to allergies to an active ingredient.  Has tried multiple different ICS/LABA therapies in the past cannot tolerate due to an active ingredients in [facility].  They does try DPI's, several, cannot tolerate due to allergies to inactive ingredients.  Has tried nebulized, long-acting nebulized therapies and cannot tolerate due to allergies to an active ingredients.  Prednisone in the past is also called worsening symptoms due to an active ingredients.  Basically, she has worsening symptoms, wheezing shortness of breath etc. with these traditional asthma treatments.  She cannot tolerate albuterol  nebulized.  There is a period of time of the last few years where her asthma was well-controlled rare albuterol  use.  However, she moved to the [place] over the last couple years.  The pollens of really bother her.  Both from a upper airway, nasal  congestion rhinorrhea allergies as well as worsening asthma symptoms.  She does endorse refractory GERD as well.  She is on biologic therapy for rheumatoid arthritis.  She had chest x-ray 09/2022 that reveals clear lungs on my review and interpretation.  She had a CT chest 08/2022 reveals clear lungs bilaterally with signs of mosaicism on my review and interpretation.  She had a second CT chest 09/2022 that reveals clear lungs bilaterally with signs of mosaicism on my review and interpretation.  Discussed at length role and rationale for biologic therapy given her inability to tolerate traditional inhaled therapies and steroid therapies.  Discussed risk and benefits.  Discussed expected results.  After shared decision-making, agreed to move forward with evaluation for biologic therapy given her inability to tolerate inhaled therapies.   Questionaires / Pulmonary Flowsheets:   ACT:  Asthma Control Test ACT Total Score  04/18/2024  9:57 AM 24  12/20/2022 10:05 AM 11    MMRC:     No data to display          Epworth:      No data to display          Tests:   FENO:  No results found for: NITRICOXIDE  PFT:     No data to display          WALK:      No data to display          Imaging: Personally reviewed and as per EMR and  discussion in this note DG Chest Port 1 View Result Date: 04/08/2024 CLINICAL DATA:  Shortness of breath EXAM: PORTABLE CHEST 1 VIEW COMPARISON:  Chest x-ray 03/21/2024 FINDINGS: The heart size and mediastinal contours are within normal limits. Both lungs are clear. The visualized skeletal structures are unremarkable. IMPRESSION: No active disease. Electronically Signed   By: Greig Pique M.D.   On: 04/08/2024 17:03   DG Chest 2 View Result Date: 03/21/2024 CLINICAL DATA:  Chest pain. EXAM: CHEST - 2 VIEW COMPARISON:  11/26/2023. FINDINGS: Low lung volume. Bilateral lung fields are clear. Bilateral costophrenic angles are clear. Normal  cardio-mediastinal silhouette. No acute osseous abnormalities. The soft tissues are within normal limits. IMPRESSION: No active cardiopulmonary disease. Electronically Signed   By: Ree Molt M.D.   On: 03/21/2024 15:08    Lab Results: Personally reviewed CBC    Component Value Date/Time   WBC 5.4 04/08/2024 1651   RBC 4.47 04/08/2024 1651   HGB 10.5 (L) 04/08/2024 1651   HGB 12.0 11/09/2023 1154   HCT 34.5 (L) 04/08/2024 1651   HCT 38.8 11/09/2023 1154   PLT 320 04/08/2024 1651   PLT 453 (H) 11/09/2023 1154   MCV 77.2 (L) 04/08/2024 1651   MCV 84 11/09/2023 1154   MCH 23.5 (L) 04/08/2024 1651   MCHC 30.4 04/08/2024 1651   RDW 17.0 (H) 04/08/2024 1651   RDW 14.5 11/09/2023 1154   LYMPHSABS 1.8 04/08/2024 1651   LYMPHSABS 3.8 (H) 11/09/2023 1154   MONOABS 0.9 04/08/2024 1651   EOSABS 0.0 04/08/2024 1651   EOSABS 0.2 11/09/2023 1154   BASOSABS 0.0 04/08/2024 1651   BASOSABS 0.1 11/09/2023 1154    BMET    Component Value Date/Time   NA 137 04/08/2024 1651   NA 141 11/04/2023 1346   K 3.8 04/08/2024 1651   CL 105 04/08/2024 1651   CO2 23 04/08/2024 1651   GLUCOSE 102 (H) 04/08/2024 1651   BUN 12 04/08/2024 1651   BUN 11 11/04/2023 1346   CREATININE 1.03 (H) 04/08/2024 1651   CREATININE 0.82 11/15/2023 1016   CALCIUM  8.4 (L) 04/08/2024 1651   GFRNONAA >60 04/08/2024 1651    BNP    Component Value Date/Time   BNP 57.0 09/09/2023 2039    ProBNP    Component Value Date/Time   PROBNP 316 (H) 11/04/2023 1346    Specialty Problems       Pulmonary Problems   Asthma   Formatting of this note might be different from the original. Asthma Formatting of this note might be different from the original. Formatting of this note might be different from the original. Asthma Formatting of this note might be different from the original. Asthma Formatting of this note might be different from the original. Asthma Formatting of this note might be different from the original.  Formatting of this note might be different from the original. Formatting of this note might be different from the original. Asthma Formatting of this note might be different from the original. Asthma Formatting of this note might be different from the original. Asthma Formatting of this note might be different from the original. Asthma      Hiatal hernia   Nocturnal hypoxemia   Moderate persistent asthma    Allergies  Allergen Reactions   Albuterol  Sulfate Shortness Of Breath    Other reaction(s): Other (See Comments)  can't breathe  Other reaction(s): Other (See Comments) can't breathe   Corticosteroids Dermatitis, Palpitations and Swelling    ABLE TO  TOLERATE VIA IM AND IV Inhaled - HA    ocular irritation   Silicone Dermatitis and Other (See Comments)    Cast and Bandage Cover  severe skin breakdown Cast and Bandage Cover  Cannot tolerate tegaderm, needs IV 3000. Paper tape.  Rash after several hours of EKG leads, but willing to tolerate.  Can tolerate silk tape for short stretches.  severe skin breakdown  severe skin breakdown Cast and Bandage Cover   Tape     Other reaction(s): Other (See Comments), Other (See Comments), Other (See Comments)  severe skin breakdown  Cast and Bandage Cover  severe skin breakdown  severe skin breakdown   Wound Dressings Dermatitis, Hives and Other (See Comments)    severe skin breakdown Cast and Bandage Cover  severe skin breakdown  severe skin breakdown Cast and Bandage Cover  Cannot tolerate tegaderm, needs IV 3000. Paper tape.  Rash after several hours of EKG leads, but willing to tolerate.  Can tolerate silk tape for short stretches.  Cast and Bandage Cover   Formoterol Other (See Comments)    Other reaction(s): Headaches  Other reaction(s): Headaches  Other reaction(s): Headaches  Headaches  Other reaction(s): Headaches Other reaction(s): Headaches    Other reaction(s): Headaches    Headaches   Hydrocodone  Rash    Hydrocodone -Acetaminophen  Rash   Nickel Rash and Other (See Comments)   Other Rash    cast and bandage use  cast and bandage use  cast and bandage use  cast and bandage use    cast and bandage use cast and bandage use   Oxycodone -Acetaminophen  Other (See Comments)    headache   Salmeterol Rash   Strawberry Extract Hives    Full body rash from strawberry   Advair Hfa [Fluticasone-Salmeterol]     unknown   Proventil  Hfa [Albuterol ]     unknown   Asa [Aspirin ] Rash   Cleocin [Clindamycin] Rash   Clindamycin/Lincomycin Rash   Fludrocortisone Acetate Rash   Gabapentin Rash   Hydrochlorothiazide Rash   Imipramine Rash   Ipratropium Bromide Rash   Medroxyprogesterone Rash   Meloxicam Rash   Metformin And Related Rash   Nystatin Rash   Penicillins Rash   Pred Forte [Prednisolone] Rash   Prednisone Rash   Solu-Medrol  [Methylprednisolone ] Rash   Sulfa Antibiotics Rash   Tegaderm Ag Mesh [Silver] Rash   Toradol  [Ketorolac  Tromethamine ] Rash   Tramadol Rash    Immunization History  Administered Date(s) Administered   Hep A, Unspecified 09/09/2011, 10/15/2011   Hepatitis B, ADULT 09/09/2011, 10/15/2011   Influenza, Quadrivalent, Recombinant, Inj, Pf 05/27/2020   Influenza,inj,Quad PF,6+ Mos 04/19/2022, 06/13/2023   Influenza-Unspecified 05/03/2012   Moderna Covid-19 Fall Seasonal Vaccine 3yrs & older 06/07/2022   Pfizer Covid-19 Vaccine Bivalent Booster 51yrs & up 07/06/2021   Pneumococcal Conjugate Pcv21, Polysaccharide Crm197 Conjugaf 07/24/2023   Tdap 09/25/2023    Past Medical History:  Diagnosis Date   Albuminuria 06/27/2015   Anxiety 11/04/2014   Arm DVT (deep venous thromboembolism), acute, left (HCC) 08/17/2022   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/30/2014   Asthma    Atopic dermatitis 10/30/2008   Formatting of this note might be different from the original. Atopic dermatitis Formatting of this note might be different from the original.  Formatting of this note might be different from the original. Atopic dermatitis Formatting of this note might be different from the original. Formatting of this note might be different from the original. Formatting of this note might be different  from the or   Cataract 05/06/2015   Contusion of left wrist 07/08/2021   Coronary artery disease s/p PCI/DES to LAD 07/01/2023 12/28/2022   Crohn's disease (HCC) 04/21/2015   De Quervain's tenosynovitis, left 07/31/2021   Diabetes mellitus without complication (HCC)    Diabetes type 2, controlled (HCC) 04/21/2015   Dry eyes 05/06/2015   DVT (deep venous thrombosis) (HCC)    Dysfunction of both eustachian tubes 08/31/2012   Encounter for monitoring immunomodulating therapy 11/20/2021   Environmental allergies 08/12/2020   Esophageal candidiasis (HCC) 06/26/2022   Essential hypertension 06/27/2015   Fibrositis 08/28/2014   Formatting of this note might be different from the original. Fibromyalgia Formatting of this note might be different from the original. Formatting of this note might be different from the original. Fibromyalgia   Flank pain 11/24/2020   Gastroesophageal reflux disease with esophagitis without hemorrhage 08/01/2019   Formatting of this note might be different from the original. Formatting of this note might be different from the original. 07/2019: chronic symptoms of esophageal reflux since prior to sleeve gastrectomy surgery. Symptoms have worsened over the last 6 months with recent EGD findings of grade B esophagitis, irregular z line, erythematous mucosa, and evidence of sleeve gastrectomy. 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 Formatting of this note might be different from the  original. 07/2019: 8 mm colonic polyp removed during colonoscopy, biopsy showed serrated polyp. Repeat colonoscopy recommended in one year. Formatting of this note might be different from the original. 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 Hypertension Formatting of this note might be different from the original. Formatting of this note might be different from the original. 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 Keratoconus Formatting of this note might be different from the original. Formatting of this note might be different from the original. Keratoconus Formatting of this note might be different from the original. Formatting of this note might be different from the original. Keratoconus Formatting of this note might be different from the or   Microhematuria 06/27/2015   Migraine 08/28/2014   Formatting of this note might be different from the original. Migraine Formatting of this note might be different from the original. Formatting of this note might be different from the original. Migraine Formatting of this note might be different from the original. Migraine Formatting of this note might be different from the original. Migraine Formatting of this note might be different from the or   Migraine without aura and without status migrainosus, not intractable 08/28/2014   Formatting of this note might be different from the original. Formatting of this note might be different from the original. Formatting of this note might be  different from the original. Migraine Formatting of this note might be different from the original. Migraine Formatting of this  note might be different from the original. Formatting of this note might be different from the original. Migraine F   Mild intermittent asthma without complication 10/30/2008   Formatting of this note might be different from the original. Formatting of this note might be different from the original. Formatting of this note might be different from the original. Asthma Formatting of this note might be different from the original. Asthma Formatting of this note might be different from the original. Formatting of this note might be different from the original. 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 Formatting of this note might be different from the original. 01/25/2016 with Dr. Loney Pol at [facility]. [place] of this note might be different from the original. 01/25/2016 with Dr. Loney Pol at [facility]. [place] of this note might be different from the original. Formatting of this note might be different from    Seronegative inflammatory arthritis 08/28/2014   Formatting of this note might be different from the original. Formatting of this note might be different from the original. Arthritis;  diagnosed as IBD related - 2012 Formatting of this note might be different from the original. Followed by rheumatologist. Had to change providers due to an insurance change earlier this year resulting in patient not being able to have Remicade  for ~6 months. 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 Formatting of this note might be different from the original. Treated with PRN miralax . Recent colonoscopy 07/2019. Formatting of this note might be different from the original. Treated with PRN miralax . Recent colonoscopy 07/2019. Formatting of this note might be different from the original. Formatting of this note might be different f   Status post placement of ureteral stent 02/06/2021   Tooth infection 08/12/2020   Unstable angina (HCC) 07/29/2023   Ureteral stone 01/20/2022   Urinary tract infection, site not specified 06/26/2022    Tobacco History: Social History   Tobacco Use  Smoking Status Never   Passive exposure: Never  Smokeless Tobacco Never   Counseling given: Not Answered   Continue to not smoke  Outpatient Encounter Medications as of 04/18/2024  Medication Sig   acetaminophen  (TYLENOL ) 500 MG tablet Take 1,000 mg by mouth every 12 (twelve) hours as needed for mild pain or moderate pain.   aMILoride (MIDAMOR) 5 MG tablet Take 10 mg by mouth daily.   atorvastatin  (LIPITOR) 40 MG tablet Take 2 tablets (80 mg total) by mouth daily.   B Complex-C (B-COMPLEX WITH VITAMIN C) tablet Take 1 tablet by mouth daily.   Cholecalciferol  (VITAMIN D3) 50 MCG (2000 UT) TABS Take 1 tablet by mouth daily.   EPINEPHrine  (EPIPEN  2-PAK) 0.3 mg/0.3 mL IJ SOAJ injection Inject 0.3 mg into the muscle as needed for anaphylaxis.   Evolocumab  (REPATHA  SURECLICK)  140 MG/ML SOAJ Inject 140 mg into the skin every 14 (fourteen) days.   famotidine  (PEPCID ) 40 MG tablet Take 40 mg by mouth 2 (two)  times daily.   levocetirizine (XYZAL) 5 MG tablet Take 10 mg by mouth daily.   losartan (COZAAR) 50 MG tablet Take 50 mg by mouth daily.   montelukast  (SINGULAIR ) 10 MG tablet Take 1 tablet (10 mg total) by mouth daily.   nitroGLYCERIN  (NITROSTAT ) 0.4 MG SL tablet Place 1 tablet (0.4 mg total) under the tongue every 5 (five) minutes as needed for chest pain.   oxycodone  (OXY-IR) 5 MG capsule Take 1 capsule (5 mg total) by mouth every 6 (six) hours as needed.   RABEprazole (ACIPHEX) 20 MG tablet Take 20 mg by mouth 2 (two) times daily.   Secukinumab (COSENTYX SENSOREADY PEN) 150 MG/ML SOAJ Inject 150 mg into the skin.   ticagrelor  (BRILINTA ) 90 MG TABS tablet Take 1 tablet (90 mg total) by mouth 2 (two) times daily.   triamcinolone  cream (KENALOG ) 0.1 % Apply 1 Application topically 2 (two) times daily.   [DISCONTINUED] albuterol  (PROVENTIL ) (2.5 MG/3ML) 0.083% nebulizer solution Take 3 mLs (2.5 mg total) by nebulization every 6 (six) hours as needed for wheezing or shortness of breath.   albuterol  (PROVENTIL ) (2.5 MG/3ML) 0.083% nebulizer solution Take 3 mLs (2.5 mg total) by nebulization every 4 (four) hours as needed for wheezing or shortness of breath.   losartan (COZAAR) 100 MG tablet Take 100 mg by mouth daily.   mirabegron  ER (MYRBETRIQ ) 50 MG TB24 tablet Take 1 tablet (50 mg total) by mouth daily.   No facility-administered encounter medications on file as of 04/18/2024.     Review of Systems  Review of Systems  N/a Physical Exam  Ht 5' 3 (1.6 m)   Wt 225 lb (102.1 kg)   BMI 39.86 kg/m   Wt Readings from Last 5 Encounters:  04/18/24 225 lb (102.1 kg)  04/08/24 214 lb (97.1 kg)  04/06/24 214 lb (97.1 kg)  03/21/24 214 lb (97.1 kg)  03/21/24 214 lb 3.2 oz (97.2 kg)    BMI Readings from Last 5 Encounters:  04/18/24 39.86 kg/m  04/08/24 37.91 kg/m  04/06/24 37.91 kg/m  03/21/24 37.91 kg/m  03/21/24 37.94 kg/m     Physical Exam General: Sitting in chair, no  acute distress Eyes: EOMI, no icterus Neck: Supple, no JVP Neck: Clear, normal work of breathing Cardiovascular: Warm, no edema Abdomen: Nondistended, bowel sounds present MSK: No synovitis, no joint effusion Neuro: Normal gait, no weakness Psych: Normal mood, full affect   Assessment & Plan:   Severe persistent asthma: Due to multiple allergies due and active ingredients, only current treatment available is albuterol  nebulizer.  Allergic to an active ingredient in the prednisone as well.  Prior inhalers, HFA, DPI, other long-acting nebulizers have caused worsening symptoms.  As such, suspect she will require a trial of biologic therapy to see if we can make her symptoms better, if these do not yield results, not much more we can offer.  Eosinophils not significant elevated, as high as 200 so to consider IL-5 therapy.  Consider [place] versus eosinophil pathway in the future.  Continue albuterol  nebulizer, refill today.  Asthma exacerbation due to COVID: Ongoing cough.  Unable to tolerate therapies as above.  Has deferred Biologics to date.  Continue albuterol  as needed.  Will likely take a few more weeks to get better.  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  Sleep test in interim  demonstrated AHI 3.4, no sleep apnea.  Did demonstrate nocturnal hypoxemia.  She returned her home oxygen concentrator with loud noises.  She would like oxygen back now.  Will prescribe again.  May need to pursue overnight oximetry if not approved via DME company.   Return in about 6 months (around 10/16/2024) for f/u Dr. Annella.   Donnice JONELLE Annella, MD 04/18/2024  Virtual Visit via Video Note  I connected with Casey [name] on 04/18/24 at 10:00 AM EDT by a video enabled telemedicine application and verified that I am speaking with the correct person using two identifiers.  Location: Patient: Home Provider: Office -  Pulmonary - [address] [place], [address], [place], [place] [address]  I  discussed the limitations of evaluation and management by telemedicine and the availability of in person appointments. The patient expressed understanding and agreed to proceed. I also discussed with the patient that there may be a patient responsible charge related to this service. The patient expressed understanding and agreed to proceed.  Patient consented to consult via telephone: Yes People present and their role in pt care: Pt

## 2024-04-22 ENCOUNTER — Ambulatory Visit (HOSPITAL_BASED_OUTPATIENT_CLINIC_OR_DEPARTMENT_OTHER)
Admission: RE | Admit: 2024-04-22 | Discharge: 2024-04-22 | Disposition: A | Discharge status: Home / Self Care | Source: Ambulatory Visit | Attending: Family Medicine | Admitting: Family Medicine

## 2024-04-22 ENCOUNTER — Encounter (HOSPITAL_BASED_OUTPATIENT_CLINIC_OR_DEPARTMENT_OTHER): Payer: Self-pay

## 2024-04-22 VITALS — BP 104/73 | HR 79 | Temp 98.4°F | Resp 20

## 2024-04-22 DIAGNOSIS — M25512 Pain in left shoulder: Secondary | ICD-10-CM | POA: Diagnosis not present

## 2024-04-22 MED ORDER — TRIAMCINOLONE ACETONIDE 40 MG/ML IJ SUSP
40.0000 mg | Freq: Once | INTRAMUSCULAR | Status: AC
  Administered 2024-04-22: 40 mg via INTRAMUSCULAR

## 2024-04-22 NOTE — ED Triage Notes (Signed)
 Has been followed by ortho for right shoulder tear. Just released from ortho and then woke up with severe shoulder pain x 3 days.

## 2024-04-22 NOTE — ED Provider Notes (Signed)
 [facility]    CSN: [id number] Arrival date & time: 04/22/24  1151      History   Chief Complaint Chief Complaint  Patient presents with   Shoulder Pain    HPI Casey [name] is a 53 y.o. female.   Patient is a 53 year old female who presents today with left shoulder pain.  Has been followed by ortho for right shoulder tear. Just released from ortho and then woke up with severe shoulder pain x 3 days. No new injuries. Requesting steroid injection.     Shoulder Pain   Past Medical History:  Diagnosis Date   Albuminuria 06/27/2015   Anxiety 11/04/2014   Arm DVT (deep venous thromboembolism), acute, left (HCC) 08/17/2022   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/30/2014   Asthma    Atopic dermatitis 10/30/2008   Formatting of this note might be different from the original. Atopic dermatitis Formatting of this note might be different from the original. Formatting of this note might be different from the original. Atopic dermatitis Formatting of this note might be different from the original. Formatting of this note might be different from the original. Formatting of this note might be different from the or   Cataract 05/06/2015   Contusion of left wrist 07/08/2021   Coronary artery disease s/p PCI/DES to LAD 07/01/2023 12/28/2022   Crohn's disease (HCC) 04/21/2015   De Quervain's tenosynovitis, left 07/31/2021   Diabetes mellitus without complication (HCC)    Diabetes type 2, controlled (HCC) 04/21/2015   Dry eyes 05/06/2015   DVT (deep venous thrombosis) (HCC)    Dysfunction of both eustachian tubes 08/31/2012   Encounter for monitoring immunomodulating therapy 11/20/2021   Environmental allergies 08/12/2020   Esophageal candidiasis (HCC) 06/26/2022   Essential hypertension 06/27/2015   Fibrositis 08/28/2014   Formatting of this note might be different from the original. Fibromyalgia Formatting of this note might be different from the original.  Formatting of this note might be different from the original. Fibromyalgia   Flank pain 11/24/2020   Gastroesophageal reflux disease with esophagitis without hemorrhage 08/01/2019   Formatting of this note might be different from the original. Formatting of this note might be different from the original. 07/2019: chronic symptoms of esophageal reflux since prior to sleeve gastrectomy surgery. Symptoms have worsened over the last 6 months with recent EGD findings of grade B esophagitis, irregular z line, erythematous mucosa, and evidence of sleeve gastrectomy. 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 Formatting of this note might be different from the original. 07/2019: 8 mm colonic polyp removed during colonoscopy, biopsy showed serrated polyp. Repeat colonoscopy recommended in one year. Formatting of this note might be different from the original. Added automatically from request for surgery [phone] Formatting of this note might b   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   History of kidney stones 08/12/2020   Hypercholesterolemia 10/07/2022   Hypertension, essential, benign 06/27/2015   Hypertensive disorder 08/28/2014   Formatting of this note might be different from the original. Hypertension Formatting of this note might be different from the original. Formatting of this note might be different from the original. 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 Keratoconus Formatting of this note might be different from the original. Formatting of this note might be different from  the original. Keratoconus Formatting of this note might be different from the original. Formatting of this note might be different from the original. Keratoconus Formatting of this note might be different from the or   Microhematuria 06/27/2015   Migraine 08/28/2014   Formatting of this note might be different from the original. Migraine Formatting of this note might be different from the original. Formatting of this note might be different from the original. Migraine Formatting of this note might be different from the original. Migraine Formatting of this note might be different from the original. Migraine Formatting of this note might be different from the or   Migraine without aura and without status migrainosus, not intractable 08/28/2014   Formatting of this note might be different from the original. Formatting of this note might be different from the original. Formatting of this note might be different from the original. Migraine Formatting of this note might be different from the original. Migraine Formatting of this note might be different from the original. Formatting of this note might be different from the original. Migraine F   Mild intermittent asthma without complication 10/30/2008   Formatting of this note might be different from the original. Formatting of this note might be different from the original. Formatting of this note might be different from the original. Asthma Formatting of this note might be different from the original. Asthma Formatting of this note might be different from the original. Formatting of this note might be different from the original. 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 Formatting of this note might be different from the original. 01/25/2016 with Dr. Loney Pol at [facility]. [place] of this note might be different from the original. 01/25/2016 with Dr. Loney Pol at [facility]. [place] of this note might be different from the original. Formatting of this note might be different from    Seronegative inflammatory arthritis 08/28/2014   Formatting of this note might be different from the original. Formatting of this note might be different from the original. Arthritis; diagnosed as IBD related - 2012 Formatting of this note might be different from the original. Followed by rheumatologist. Had to change providers due to an insurance change earlier this year resulting in patient not being able to have Remicade  for ~6 months. 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 Formatting of this note might be different from the original. Treated with PRN miralax . Recent colonoscopy 07/2019. Formatting of this note might be different from the original. Treated with PRN miralax . Recent colonoscopy 07/2019. Formatting of  this note might be different from the original. Formatting of this note might be different f   Status post placement of ureteral stent 02/06/2021   Tooth infection 08/12/2020   Unstable angina (HCC) 07/29/2023   Ureteral stone 01/20/2022   Urinary tract infection, site not specified 06/26/2022    Patient Active Problem List   Diagnosis Date Noted   Dizziness  02/13/2024   Skin rash 02/13/2024   Other chronic pain 12/14/2023   Hypokalemia 09/21/2023   Bilateral lower extremity edema 09/16/2023   Unstable angina (HCC) 07/29/2023   Moderate persistent asthma 07/29/2023   History of DVT (deep vein thrombosis) 06/30/2023   Nocturnal hypoxemia 06/13/2023   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   Coronary artery disease s/p PCI/DES to LAD 07/01/2023 12/28/2022   Hyperlipidemia 10/07/2022   Hiatal hernia 10/07/2022   Esophageal candidiasis (HCC) 06/26/2022   Rheumatoid arthritis of multiple sites with 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 factor (HCC) 11/16/2021   De Quervain's tenosynovitis, left 07/31/2021   Status post placement of ureteral stent 02/06/2021   Proteinuria 08/12/2020   Environmental allergies 08/12/2020   History of kidney stones 08/12/2020   Gastroesophageal reflux disease with esophagitis without hemorrhage 08/01/2019   Slow transit constipation 08/01/2019   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   History of colon polyps 06/19/2019   Irregular astigmatism of both eyes 10/11/2018   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   Myopia with astigmatism and presbyopia 05/07/2016   Overactive bladder 11/18/2015   Incontinence 10/07/2015   Essential hypertension 06/27/2015   Cataract 05/06/2015   Dry eyes 05/06/2015   Crohn's disease (HCC) 04/21/2015   Osteoarthritis of both knees 04/21/2015   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/30/2014   History of abnormal cervical Pap smear 11/29/2014   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   Migraine without aura and without status migrainosus, not intractable 08/28/2014   Migraine 08/28/2014   Seronegative inflammatory arthritis 08/28/2014   Increased urinary frequency 07/16/2013   Dysfunction of both eustachian tubes 08/31/2012   Asthma 10/30/2008    Past Surgical History:  Procedure Laterality Date   BACK SURGERY     L5-S1    CERVICAL CONE BIOPSY  04/2000   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   CORONARY STENT INTERVENTION N/A 07/01/2023   Procedure: CORONARY STENT INTERVENTION;  Surgeon: Darron Deatrice LABOR, MD;  Location: [facility];  Service: Cardiovascular;  Laterality: N/A;   EYE SURGERY     GASTRIC BYPASS  2017   [place]   LAPAROSCOPIC GASTRIC SLEEVE RESECTION     LEFT HEART CATH AND CORONARY ANGIOGRAPHY N/A 07/01/2023   Procedure: LEFT HEART CATH AND CORONARY ANGIOGRAPHY;  Surgeon: Darron Deatrice LABOR, MD;  Location: [facility];  Service: Cardiovascular;  Laterality: N/A;   LEFT HEART CATH AND CORONARY ANGIOGRAPHY N/A 08/01/2023   Procedure: LEFT HEART CATH AND CORONARY ANGIOGRAPHY;  Surgeon: Ladona Heinz, MD;  Location: [facility];  Service: Cardiovascular;  Laterality: N/A;   Low back disc surgery     06/2000   TOTAL KNEE ARTHROPLASTY Right 11/2016    OB History     Gravida  0   Para  0   Term  0   Preterm  0   AB  0   Living  0      SAB  0   IAB  0   Ectopic  0   Multiple  0   Live Births  0  Home Medications    Prior to Admission medications   Medication Sig Start Date End Date Taking? Authorizing Provider  acetaminophen  (TYLENOL ) 500 MG tablet Take 1,000 mg by mouth every 12 (twelve) hours as needed for mild pain or moderate pain.    [provider]  albuterol  (PROVENTIL ) (2.5 MG/3ML) 0.083% nebulizer solution Take 3 mLs (2.5 mg total) by nebulization every 4 (four) hours as needed for wheezing or shortness of breath. 04/18/24   Hunsucker, Donnice SAUNDERS, MD  aMILoride (MIDAMOR) 5 MG tablet Take 10 mg by mouth daily. 03/18/24   [provider]  atorvastatin  (LIPITOR) 40 MG tablet Take 2 tablets (80 mg total) by mouth daily. 01/19/24   Tolia, Sunit, DO  B Complex-C (B-COMPLEX WITH VITAMIN C) tablet Take 1 tablet by mouth daily. 09/06/19   [provider]  Cholecalciferol  (VITAMIN D3) 50 MCG (2000 UT) TABS Take 1 tablet by mouth  daily.    [provider]  EPINEPHrine  (EPIPEN  2-PAK) 0.3 mg/0.3 mL IJ SOAJ injection Inject 0.3 mg into the muscle as needed for anaphylaxis. 04/24/24   Cari Arlean HERO, FNP  Evolocumab  (REPATHA  SURECLICK) 140 MG/ML SOAJ Inject 140 mg into the skin every 14 (fourteen) days. 12/27/23   Tolia, Sunit, DO  famotidine  (PEPCID ) 40 MG tablet Take 40 mg by mouth 2 (two) times daily. 12/20/23 12/19/24  [provider]  levocetirizine (XYZAL) 5 MG tablet Take 10 mg by mouth daily.    [provider]  losartan (COZAAR) 100 MG tablet Take 100 mg by mouth daily. 12/08/23 12/07/24  [provider]  losartan (COZAAR) 50 MG tablet Take 50 mg by mouth daily.    [provider]  mirabegron  ER (MYRBETRIQ ) 50 MG TB24 tablet Take 1 tablet (50 mg total) by mouth daily. 11/30/22   Jason Leita Repine, FNP  montelukast  (SINGULAIR ) 10 MG tablet Take 1 tablet (10 mg total) by mouth daily. 04/24/24   Cari Arlean HERO, FNP  nitroGLYCERIN  (NITROSTAT ) 0.4 MG SL tablet Place 1 tablet (0.4 mg total) under the tongue every 5 (five) minutes as needed for chest pain. 12/13/23   Tolia, Sunit, DO  oxycodone  (OXY-IR) 5 MG capsule Take 1 capsule (5 mg total) by mouth every 6 (six) hours as needed. 02/29/24   Gerome Maurilio HERO, PA-C  RABEprazole (ACIPHEX) 20 MG tablet Take 20 mg by mouth 2 (two) times daily.    [provider]  Secukinumab (COSENTYX SENSOREADY PEN) 150 MG/ML SOAJ Inject 150 mg into the skin. 03/01/24   [provider]  ticagrelor  (BRILINTA ) 90 MG TABS tablet Take 1 tablet (90 mg total) by mouth 2 (two) times daily. 01/12/24   Tolia, Sunit, DO  triamcinolone  cream (KENALOG ) 0.1 % Apply 1 Application topically 2 (two) times daily. 04/09/24   Vivienne Delon HERO, PA-C    Family History Family History  Adopted: Yes  Problem Relation Age of Onset   Diabetes Mother    Hypertension Mother    Diabetes Father    Hypertension Father    Hypertension Sister    Diabetes Sister     Hypertension Brother    Diabetes Brother    Diabetes Paternal Grandmother    Cancer Other    Asthma Neg Hx    Allergic rhinitis Neg Hx    Atopy Neg Hx    Eczema Neg Hx     Social History Social History   Tobacco Use   Smoking status: Never    Passive exposure: Never   Smokeless tobacco:  Never  Vaping Use   Vaping status: Never Used  Substance Use Topics   Alcohol use: Not Currently    Comment: rare   Drug use: Never     Allergies   Albuterol  sulfate, Corticosteroids, Silicone, Tape, Wound dressings, Formoterol, Hydrocodone , Hydrocodone -acetaminophen , Nickel, Other, Oxycodone -acetaminophen , Salmeterol, Strawberry extract, Advair hfa [fluticasone-salmeterol], Proventil  hfa [albuterol ], Asa [aspirin ], Cleocin [clindamycin], Clindamycin/lincomycin, Fludrocortisone acetate, Gabapentin, Hydrochlorothiazide, Imipramine, Ipratropium bromide, Medroxyprogesterone, Meloxicam, Metformin and related, Nystatin, Penicillins, Pred forte [prednisolone], Prednisone, Solu-medrol  [methylprednisolone ], Sulfa antibiotics, Tegaderm ag mesh [silver], Toradol  [ketorolac  tromethamine ], and Tramadol   Review of Systems Review of Systems  See HPI Physical Exam Triage Vital Signs ED Triage Vitals  Encounter Vitals Group     BP 04/22/24 1213 104/73     Girls Systolic BP Percentile --      Girls Diastolic BP Percentile --      Boys Systolic BP Percentile --      Boys Diastolic BP Percentile --      Pulse Rate 04/22/24 1213 79     Resp 04/22/24 1213 20     Temp 04/22/24 1213 98.4 F (36.9 C)     Temp Source 04/22/24 1213 Oral     SpO2 04/22/24 1213 96 %     Weight --      Height --      Head Circumference --      Peak Flow --      Pain Score 04/22/24 1214 8     Pain Loc --      Pain Education --      Exclude from Growth Chart --    No data found.  Updated Vital Signs BP 104/73 (BP Location: Right Arm)   Pulse 79   Temp 98.4 F (36.9 C) (Oral)   Resp 20   SpO2 96%   Visual  Acuity Right Eye Distance:   Left Eye Distance:   Bilateral Distance:    Right Eye Near:   Left Eye Near:    Bilateral Near:     Physical Exam Vitals and nursing note reviewed.  Constitutional:      General: She is not in acute distress.    Appearance: Normal appearance. She is not ill-appearing, toxic-appearing or diaphoretic.  Pulmonary:     Effort: Pulmonary effort is normal.  Musculoskeletal:     Left shoulder: Tenderness and bony tenderness present. Decreased range of motion.  Neurological:     Mental Status: She is alert.  Psychiatric:        Mood and Affect: Mood normal.      UC Treatments / Results  Labs (all labs ordered are listed, but only abnormal results are displayed) Labs Reviewed - No data to display  EKG   Radiology No results found.  Procedures Procedures (including critical care time)  Medications Ordered in UC Medications  triamcinolone  acetonide (KENALOG -40) injection 40 mg (40 mg Intramuscular Given 04/22/24 1233)    Initial Impression / Assessment and Plan / UC Course  I have reviewed the triage vital signs and the nursing notes.  Pertinent labs & imaging results that were available during my care of the patient were reviewed by me and considered in my medical decision making (see chart for details).     Left shoulder pain-steroid injection given here as requested.  Plans to follow-up with Ortho if needed. No other concerns at this time Final Clinical Impressions(s) / UC Diagnoses   Final diagnoses:  Pain in joint of left shoulder   Discharge Instructions  None    ED Prescriptions   None    PDMP not reviewed this encounter.   Adah Wilbert LABOR, FNP 04/25/24 [phone]

## 2024-04-23 ENCOUNTER — Encounter (HOSPITAL_COMMUNITY): Payer: Self-pay | Admitting: *Deleted

## 2024-04-24 ENCOUNTER — Telehealth (INDEPENDENT_AMBULATORY_CARE_PROVIDER_SITE_OTHER): Admitting: Family Medicine

## 2024-04-24 ENCOUNTER — Other Ambulatory Visit: Payer: Self-pay | Admitting: Cardiovascular Disease

## 2024-04-24 ENCOUNTER — Encounter: Payer: Self-pay | Admitting: Family Medicine

## 2024-04-24 ENCOUNTER — Ambulatory Visit: Admitting: Allergy

## 2024-04-24 DIAGNOSIS — H1013 Acute atopic conjunctivitis, bilateral: Secondary | ICD-10-CM

## 2024-04-24 DIAGNOSIS — T7800XD Anaphylactic reaction due to unspecified food, subsequent encounter: Secondary | ICD-10-CM

## 2024-04-24 DIAGNOSIS — K219 Gastro-esophageal reflux disease without esophagitis: Secondary | ICD-10-CM | POA: Diagnosis not present

## 2024-04-24 DIAGNOSIS — R002 Palpitations: Secondary | ICD-10-CM

## 2024-04-24 DIAGNOSIS — J4520 Mild intermittent asthma, uncomplicated: Secondary | ICD-10-CM

## 2024-04-24 DIAGNOSIS — I25110 Atherosclerotic heart disease of native coronary artery with unstable angina pectoris: Secondary | ICD-10-CM

## 2024-04-24 DIAGNOSIS — J45909 Unspecified asthma, uncomplicated: Secondary | ICD-10-CM | POA: Diagnosis not present

## 2024-04-24 DIAGNOSIS — I10 Essential (primary) hypertension: Secondary | ICD-10-CM

## 2024-04-24 DIAGNOSIS — Z91038 Other insect allergy status: Secondary | ICD-10-CM

## 2024-04-24 DIAGNOSIS — J302 Other seasonal allergic rhinitis: Secondary | ICD-10-CM

## 2024-04-24 DIAGNOSIS — E785 Hyperlipidemia, unspecified: Secondary | ICD-10-CM

## 2024-04-24 MED ORDER — MONTELUKAST SODIUM 10 MG PO TABS: 10.0000 mg | ORAL_TABLET | Freq: Every day | ORAL | 1 refills | Status: AC

## 2024-04-24 MED ORDER — EPINEPHRINE 0.3 MG/0.3ML IJ SOAJ: 0.3000 mg | INTRAMUSCULAR | 2 refills | Status: AC | PRN

## 2024-04-24 NOTE — Progress Notes (Signed)
 RE: Casey [name] MRN: [id number] DOB: March 18, 1971 Date of MyChart Video Visit: 04/24/2024  Referring provider: Iven Lang [name] Primary care provider: Iven Lang ONEIDA, PA-C  Chief Complaint: Follow-up   MyChart video Follow Up Visit via MyChart video: I connected with Casey [name] for a follow up on 04/24/24 by MyChart video and verified that I am speaking with the correct person using two identifiers.   I discussed the limitations, risks, security and privacy concerns of performing an evaluation and management service by MyChart video and the availability of in person appointments. I also discussed with the patient that there may be a patient responsible charge related to this service. The patient expressed understanding and agreed to proceed.  Patient is at home.  Provider is at the office.  Visit start time: 1023 Visit end time: 79 Insurance consent/check in by: self-check in [employer] consent and medical assistant/nurse: Charlis Harner  History of Present Illness: She is a 53 y.o. 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 Her previous allergy  office visit was on 11/21/2023 with Wanda Craze, FNP.  In the interim, she was diagnosed with COVID by RT PCR on April 06, 2024.  She returned to the emergency department on 04/08/2024 for worsening of COVID symptoms where she received IV fluids.  Chest x-ray without acute findings and reassuring labs.  At today's visit, she reports that she is beginning to recover from symptoms of her recent COVID diagnosis.  She reports that she continues to experience some dry cough, however, this has significantly improved over the last several days.  She reports that before she was infected with COVID, her asthma has been more well-controlled with no shortness of breath, cough, or wheeze.  She continues montelukast  10 mg once a day and is currently using  albuterol  about twice a day with relief of symptoms.  She reports that before she was infected with COVID she rarely used albuterol  for relief of asthma symptoms. She continues to follow with Dr. Annella, pulmonology specialist, with her last appointment on 04/18/2024. She reports allergic reaction to corticosteroid inhalers and prednisone.   Allergic rhinitis is reported as moderately well-controlled with symptoms including rhinorrhea, nasal congestion, sneezing, and postnasal drainage.  She continues Xyzal 5 mg once a day and is not currently using any nasal preparations.  She reports that every nasal preparation gives her migraine after use.  She is interested in using a nasal stick called boom boom for possible relief of nasal congestion.  We discussed that member nasal sticks are not FDA approved or recommended as this is not a medication, however, I conglomeration of essential oils.  She continues allergen immunotherapy with no large or local reactions.  She reports a significant decrease in her symptoms of allergic rhinitis while continuing on allergen immunotherapy. She began allergen immunotherapy directed toward grass pollen, weed pollen, tree pollen, mold, dog, cat, dust mite, and cockroach of 11/15/2023.   Allergic conjunctivitis is reported as moderately well-controlled with occasional red and itchy eyes for which she is not currently using any eyedrops.  She has recently seen her ophthalmologist, Dr. Theda at [facility], and has an appointment for cataract surgery on May 29, 2024  She continues to avoid fresh strawberry, artificial strawberry, and strawberry scents with no accidental ingestion or contact since her last visit to this clinic. Her last food allergy  skin testing on 01/25/2023 was positive to strawberry.  EpiPen  sent  is out of date and will be reordered at today's visit.  She denies any insect stings since her last visit to this clinic. 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  EpiPen  set has been reordered as mentioned above  Reflux is reported as moderately well-controlled with rare reflux symptoms.  She continues to follow-up with Dr.Noujaim, [facility] health specialist [facility].  At her last appointment on 04/03/2024, she reported severe substernal pain and vomiting after eating.  An upper GI series will be obtained and she will continue lubiprostone 8 mcg bid with meals.  She has previously had candidal esophagitis requiring 14 infusions of micafungin for resolution.  Her current medications are listed in the chart.  Of note, she continues to follow up with her cardiologist, Dr. Court with [facility] with her last appointment on 03/21/2024. She had a LAD stent on 06/2023 with re-examination 1 month later due to report of chest pain. She had been experiencing right sided chest pain relieved by nitroglycerine and is scheduled for perfusion study  on 05/02/2024.  Chart review: CLINICAL DATA:  Shortness of breath   EXAM: PORTABLE CHEST 1 VIEW COMPARISON:  Chest x-ray 03/21/2024 FINDINGS: The heart size and mediastinal contours are within normal limits. Both lungs are clear. The visualized skeletal structures are unremarkable.   IMPRESSION: No active disease.     Electronically Signed   By: Greig Pique M.D.   On: 04/08/2024 17:03   Assessment and Plan: Nathan is a 53 y.o. female with: Patient Instructions  Asthma Continue montelukast  10 mg once a day Continue albuterol  2 puffs every 4 hours as needed for cough or wheeze OR Instead use albuterol  0.083% solution via nebulizer one unit vial every 4 hours as needed for cough or wheeze  You may use albuterol  2 puffs 5 to 15 minutes before activity to decrease cough or wheeze Continue to follow up with your pulmonary specialist as recommended  Allergic rhinitis Continue allergen avoidance measures directed toward  grass pollen, weed pollen, tree pollen, mold, dust mite, cat, dog, and cockroach as listed below Continue allergen immunotherapy and have access to an epinephrine  autoinjector set per protocol Continue Xyzal 5 mg once or twice a day if needed for runny nose or itch Consider saline nasal rinses as needed for nasal symptoms. Use this before any medicated nasal sprays for best result  Allergic conjunctivitis Some over the counter eye drops include Pataday one drop in each eye once a day as needed for red, itchy eyes OR Zaditor one drop in each eye twice a day as needed for red itchy eyes. Avoid eye drops that say red eye relief as they may contain medications that dry out your eyes.   Reflux Continue dietary lifestyle modifications as listed below Continue to follow up with your GI specialist as recommended  Food allergy  Continue to avoid strawberry.  In case of an allergic reaction, take cetirizine 10 mg once every 12-24 hours, and if life-threatening symptoms occur, inject with EpiPen  0.3 mg.  Stinging insect allergy  Continue to avoid stinging insects.  Consider venom immunotherapy directed toward white hornet. Continue to avoid strawberry.  In case of an allergic reaction, take cetirizine 10 mg once every 12-24 hours, and if life-threatening symptoms occur, inject with EpiPen  0.3 mg. Consider venom immunotherapy. Call the clinic for insurance codes if you are interested in this option  Call the clinic if this treatment plan is not working well  for you  Follow up in the clinic in 6 months or sooner if needed.   Return in about 6 months (around 10/22/2024), or if symptoms worsen or fail to improve.  No orders of the defined types were placed in this encounter.  Lab Orders  No laboratory test(s) ordered today     Medication List:  Current Outpatient Medications  Medication Sig Dispense Refill   acetaminophen  (TYLENOL ) 500 MG tablet Take 1,000 mg by mouth every 12 (twelve) hours as  needed for mild pain or moderate pain.     albuterol  (PROVENTIL ) (2.5 MG/3ML) 0.083% nebulizer solution Take 3 mLs (2.5 mg total) by nebulization every 4 (four) hours as needed for wheezing or shortness of breath. 540 mL 12   aMILoride (MIDAMOR) 5 MG tablet Take 10 mg by mouth daily.     atorvastatin  (LIPITOR) 40 MG tablet Take 2 tablets (80 mg total) by mouth daily. 180 tablet 3   Cholecalciferol  (VITAMIN D3) 50 MCG (2000 UT) TABS Take 1 tablet by mouth daily.     Evolocumab  (REPATHA  SURECLICK) 140 MG/ML SOAJ Inject 140 mg into the skin every 14 (fourteen) days. 2 mL 11   famotidine  (PEPCID ) 40 MG tablet Take 40 mg by mouth 2 (two) times daily.     levocetirizine (XYZAL) 5 MG tablet Take 10 mg by mouth daily.     losartan (COZAAR) 50 MG tablet Take 50 mg by mouth daily.     nitroGLYCERIN  (NITROSTAT ) 0.4 MG SL tablet Place 1 tablet (0.4 mg total) under the tongue every 5 (five) minutes as needed for chest pain. 30 tablet 3   oxycodone  (OXY-IR) 5 MG capsule Take 1 capsule (5 mg total) by mouth every 6 (six) hours as needed. 30 capsule 0   RABEprazole (ACIPHEX) 20 MG tablet Take 20 mg by mouth 2 (two) times daily.     Secukinumab (COSENTYX SENSOREADY PEN) 150 MG/ML SOAJ Inject 150 mg into the skin.     ticagrelor  (BRILINTA ) 90 MG TABS tablet Take 1 tablet (90 mg total) by mouth 2 (two) times daily. 180 tablet 3   B Complex-C (B-COMPLEX WITH VITAMIN C) tablet Take 1 tablet by mouth daily.     EPINEPHrine  (EPIPEN  2-PAK) 0.3 mg/0.3 mL IJ SOAJ injection Inject 0.3 mg into the muscle as needed for anaphylaxis. 2 each 2   losartan (COZAAR) 100 MG tablet Take 100 mg by mouth daily.     mirabegron  ER (MYRBETRIQ ) 50 MG TB24 tablet Take 1 tablet (50 mg total) by mouth daily. 30 tablet 11   montelukast  (SINGULAIR ) 10 MG tablet Take 1 tablet (10 mg total) by mouth daily. 90 tablet 1   triamcinolone  cream (KENALOG ) 0.1 % Apply 1 Application topically 2 (two) times daily. 30 g 0   No current  facility-administered medications for this visit.   Allergies: Allergies  Allergen Reactions   Albuterol  Sulfate Shortness Of Breath    Other reaction(s): Other (See Comments)  can't breathe  Other reaction(s): Other (See Comments) can't breathe   Corticosteroids Dermatitis, Palpitations and Swelling    ABLE TO TOLERATE VIA IM AND IV Inhaled - HA    ocular irritation   Silicone Dermatitis and Other (See Comments)    Cast and Bandage Cover  severe skin breakdown Cast and Bandage Cover  Cannot tolerate tegaderm, needs IV 3000. Paper tape.  Rash after several hours of EKG leads, but willing to tolerate.  Can tolerate silk tape for short stretches.  severe skin breakdown  severe skin  breakdown Cast and Bandage Cover   Tape     Other reaction(s): Other (See Comments), Other (See Comments), Other (See Comments)  severe skin breakdown  Cast and Bandage Cover  severe skin breakdown  severe skin breakdown   Wound Dressings Dermatitis, Hives and Other (See Comments)    severe skin breakdown Cast and Bandage Cover  severe skin breakdown  severe skin breakdown Cast and Bandage Cover  Cannot tolerate tegaderm, needs IV 3000. Paper tape.  Rash after several hours of EKG leads, but willing to tolerate.  Can tolerate silk tape for short stretches.  Cast and Bandage Cover   Formoterol Other (See Comments)    Other reaction(s): Headaches  Other reaction(s): Headaches  Other reaction(s): Headaches  Headaches  Other reaction(s): Headaches Other reaction(s): Headaches    Other reaction(s): Headaches    Headaches   Hydrocodone  Rash   Hydrocodone -Acetaminophen  Rash   Nickel Rash and Other (See Comments)   Other Rash    cast and bandage use  cast and bandage use  cast and bandage use  cast and bandage use    cast and bandage use cast and bandage use   Oxycodone -Acetaminophen  Other (See Comments)    headache   Salmeterol Rash   Strawberry Extract Hives    Full body rash from strawberry    Advair Hfa [Fluticasone-Salmeterol]     unknown   Proventil  Hfa [Albuterol ]     unknown   Asa [Aspirin ] Rash   Cleocin [Clindamycin] Rash   Clindamycin/Lincomycin Rash   Fludrocortisone Acetate Rash   Gabapentin Rash   Hydrochlorothiazide Rash   Imipramine Rash   Ipratropium Bromide Rash   Medroxyprogesterone Rash   Meloxicam Rash   Metformin And Related Rash   Nystatin Rash   Penicillins Rash   Pred Forte [Prednisolone] Rash   Prednisone Rash   Solu-Medrol  [Methylprednisolone ] Rash   Sulfa Antibiotics Rash   Tegaderm Ag Mesh [Silver] Rash   Toradol  [Ketorolac  Tromethamine ] Rash   Tramadol Rash   I reviewed her past medical history, social history, family history, and environmental history and no significant changes have been reported from previous visit on 11/17/2023.   Objective: Physical Exam Not obtained as encounter was done via MyChart video.  Previous notes and tests were reviewed.  I discussed the assessment and treatment plan with the patient. The patient was provided an opportunity to ask questions and all were answered. The patient agreed with the plan and demonstrated an understanding of the instructions.   The patient was advised to call back or seek an in-person evaluation if the symptoms worsen or if the condition fails to improve as anticipated.  I provided 20 minutes of non-face-to-face time during this encounter.  It was my pleasure to participate in Lavalette Clingerman's care today. Please feel free to contact me with any questions or concerns.   Sincerely,  Arlean Mutter, FNP

## 2024-04-24 NOTE — Patient Instructions (Addendum)
 Asthma Continue montelukast  10 mg once a day Continue albuterol  2 puffs every 4 hours as needed for cough or wheeze OR Instead use albuterol  0.083% solution via nebulizer one unit vial every 4 hours as needed for cough or wheeze  You may use albuterol  2 puffs 5 to 15 minutes before activity to decrease cough or wheeze Continue to follow up with your pulmonary specialist as recommended  Allergic rhinitis Continue allergen avoidance measures directed toward grass pollen, weed pollen, tree pollen, mold, dust mite, cat, dog, and cockroach as listed below Continue allergen immunotherapy and have access to an epinephrine  autoinjector set per protocol Continue Xyzal 5 mg once or twice a day if needed for runny nose or itch Consider saline nasal rinses as needed for nasal symptoms. Use this before any medicated nasal sprays for best result  Allergic conjunctivitis Some over the counter eye drops include Pataday one drop in each eye once a day as needed for red, itchy eyes OR Zaditor one drop in each eye twice a day as needed for red itchy eyes. Avoid eye drops that say red eye relief as they may contain medications that dry out your eyes.   Reflux Continue dietary lifestyle modifications as listed below Continue to follow up with your GI specialist as recommended  Food allergy  Continue to avoid strawberry.  In case of an allergic reaction, take cetirizine 10 mg once every 12-24 hours, and if life-threatening symptoms occur, inject with EpiPen  0.3 mg.  Stinging insect allergy  Continue to avoid stinging insects.  Consider venom immunotherapy directed toward white hornet. Continue to avoid strawberry.  In case of an allergic reaction, take cetirizine 10 mg once every 12-24 hours, and if life-threatening symptoms occur, inject with EpiPen  0.3 mg. Consider venom immunotherapy. Call the clinic for insurance codes if you are interested in this option  Call the clinic if this treatment plan is not  working well for you  Follow up in the clinic in 6 months or sooner if needed.  Reducing Pollen Exposure The American Academy of Allergy , Asthma and Immunology suggests the following steps to reduce your exposure to pollen during allergy  seasons. Do not hang sheets or clothing out to dry; pollen may collect on these items. Do not mow lawns or spend time around freshly cut grass; mowing stirs up pollen. Keep windows closed at night.  Keep car windows closed while driving. Minimize morning activities outdoors, a time when pollen counts are usually at their highest. Stay indoors as much as possible when pollen counts or humidity is high and on windy days when pollen tends to remain in the air longer. Use air conditioning when possible.  Many air conditioners have filters that trap the pollen spores. Use a HEPA room air filter to remove pollen form the indoor air you breathe.  Control of Mold Allergen Mold and fungi can grow on a variety of surfaces provided certain temperature and moisture conditions exist.  Outdoor molds grow on plants, decaying vegetation and soil.  The major outdoor mold, Alternaria and Cladosporium, are found in very high numbers during hot and dry conditions.  Generally, a late Summer - Fall peak is seen for common outdoor fungal spores.  Rain will temporarily lower outdoor mold spore count, but counts rise rapidly when the rainy period ends.  The most important indoor molds are Aspergillus and Penicillium.  Dark, humid and poorly ventilated basements are ideal sites for mold growth.  The next most common sites of mold growth are the bathroom  and the kitchen.  Outdoor [employer] Use air conditioning and keep windows closed Avoid exposure to decaying vegetation. Avoid leaf raking. Avoid grain handling. Consider wearing a face mask if working in moldy areas.  Indoor Mold Control Maintain humidity below 50%. Clean washable surfaces with 5% bleach solution. Remove sources  e.g. Contaminated carpets.  Control of Dog or Cat Allergen Avoidance is the best way to manage a dog or cat allergy . If you have a dog or cat and are allergic to dog or cats, consider removing the dog or cat from the home. If you have a dog or cat but don't want to find it a new home, or if your family wants a pet even though someone in the household is allergic, here are some strategies that may help keep symptoms at bay:  Keep the pet out of your bedroom and restrict it to only a few rooms. Be advised that keeping the dog or cat in only one room will not limit the allergens to that room. Don't pet, hug or kiss the dog or cat; if you do, wash your hands with soap and water. High-efficiency particulate air (HEPA) cleaners run continuously in a bedroom or living room can reduce allergen levels over time. Regular use of a high-efficiency vacuum cleaner or a central vacuum can reduce allergen levels. Giving your dog or cat a bath at least once a week can reduce airborne allergen.   Control of Dust Mite Allergen Dust mites play a major role in allergic asthma and rhinitis. They occur in environments with high humidity wherever human skin is found. Dust mites absorb humidity from the atmosphere (ie, they do not drink) and feed on organic matter (including shed human and animal skin). Dust mites are a microscopic type of insect that you cannot see with the naked eye. High levels of dust mites have been detected from mattresses, pillows, carpets, upholstered furniture, bed covers, clothes, soft toys and any woven material. The principal allergen of the dust mite is found in its feces. A gram of dust may contain 1,000 mites and 250,000 fecal particles. Mite antigen is easily measured in the air during house cleaning activities. Dust mites do not bite and do not cause harm to humans, other than by triggering allergies/asthma.  Ways to decrease your exposure to dust mites in your home:  1. Encase mattresses,  box springs and pillows with a mite-impermeable barrier or cover  2. Wash sheets, blankets and drapes weekly in hot water (130 F) with detergent and dry them in a dryer on the hot setting.  3. Have the room cleaned frequently with a vacuum cleaner and a damp dust-mop. For carpeting or rugs, vacuuming with a vacuum cleaner equipped with a high-efficiency particulate air (HEPA) filter. The dust mite allergic individual should not be in a room which is being cleaned and should wait 1 hour after cleaning before going into the room.  4. Do not sleep on upholstered furniture (eg, couches).  5. If possible removing carpeting, upholstered furniture and drapery from the home is ideal. Horizontal blinds should be eliminated in the rooms where the person spends the most time (bedroom, study, television room). Washable vinyl, roller-type shades are optimal.  6. Remove all non-washable stuffed toys from the bedroom. Wash stuffed toys weekly like sheets and blankets above.  7. Reduce indoor humidity to less than 50%. Inexpensive humidity monitors can be purchased at most hardware stores. Do not use a humidifier as can make the problem worse  and are not recommended.  Control of Cockroach Allergen Cockroach allergen has been identified as an important cause of acute attacks of asthma, especially in urban settings.  There are fifty-five species of cockroach that exist in the [place] , however only three, the [place], [place] and [place] species produce allergen that can affect patients with Asthma.  Allergens can be obtained from fecal particles, egg casings and secretions from cockroaches.    Remove food sources. Reduce access to water. Seal access and entry points. Spray runways with 0.5-1% Diazinon or Chlorpyrifos Blow boric acid power under stoves and refrigerator. Place bait stations (hydramethylnon) at feeding sites.   Lifestyle Changes for Controlling GERD When you have GERD, stomach acid  feels as if it's backing up toward your mouth. Whether or not you take medication to control your GERD, your symptoms can often be improved with lifestyle changes.   Raise Your Head Reflux is more likely to strike when you're lying down flat, because stomach fluid can flow backward more easily. Raising the head of your bed 4-6 inches can help. To do this: Slide blocks or books under the legs at the head of your bed. Or, place a wedge under the mattress. Many foam stores can make a suitable wedge for you. The wedge should run from your waist to the top of your head. Don't just prop your head on several pillows. This increases pressure on your stomach. It can make GERD worse.  Watch Your Eating Habits Certain foods may increase the acid in your stomach or relax the lower esophageal sphincter, making GERD more likely. It's best to avoid the following: Coffee, tea, and carbonated drinks (with and without caffeine) Fatty, fried, or spicy food Mint, chocolate, onions, and tomatoes Any other foods that seem to irritate your stomach or cause you pain  Relieve the Pressure Eat smaller meals, even if you have to eat more often. Don't lie down right after you eat. Wait a few hours for your stomach to empty. Avoid tight belts and tight-fitting clothes. Lose excess weight.  Tobacco and Alcohol Avoid smoking tobacco and drinking alcohol. They can make GERD symptoms worse.

## 2024-04-25 ENCOUNTER — Telehealth: Payer: Self-pay | Admitting: Pulmonary Disease

## 2024-04-25 DIAGNOSIS — G4734 Idiopathic sleep related nonobstructive alveolar hypoventilation: Secondary | ICD-10-CM

## 2024-04-25 DIAGNOSIS — J4540 Moderate persistent asthma, uncomplicated: Secondary | ICD-10-CM

## 2024-04-25 NOTE — Telephone Encounter (Signed)
 Please order overnight oximetry, to be performed on room air.  Thank you.

## 2024-04-25 NOTE — Telephone Encounter (Signed)
 Per Veva with [employer] This patient will need to have an overnight oximetry done unless she qualifies for 24-hour O2 via on room air at rest or walk test.

## 2024-04-25 NOTE — Telephone Encounter (Signed)
 ONO ordered. Patient is aware. Nothing further needed.

## 2024-05-01 ENCOUNTER — Emergency Department (HOSPITAL_COMMUNITY)
Admission: EM | Admit: 2024-05-01 | Discharge: 2024-05-02 | Disposition: A | Discharge status: Home / Self Care | Attending: Emergency Medicine | Admitting: Emergency Medicine

## 2024-05-01 ENCOUNTER — Encounter (HOSPITAL_COMMUNITY): Payer: Self-pay

## 2024-05-01 ENCOUNTER — Other Ambulatory Visit: Payer: Self-pay

## 2024-05-01 ENCOUNTER — Telehealth: Payer: Self-pay | Admitting: Cardiology

## 2024-05-01 ENCOUNTER — Emergency Department (HOSPITAL_COMMUNITY)

## 2024-05-01 DIAGNOSIS — R079 Chest pain, unspecified: Secondary | ICD-10-CM

## 2024-05-01 DIAGNOSIS — I25110 Atherosclerotic heart disease of native coronary artery with unstable angina pectoris: Secondary | ICD-10-CM | POA: Diagnosis present

## 2024-05-01 DIAGNOSIS — R072 Precordial pain: Secondary | ICD-10-CM | POA: Insufficient documentation

## 2024-05-01 DIAGNOSIS — I10 Essential (primary) hypertension: Secondary | ICD-10-CM | POA: Diagnosis present

## 2024-05-01 DIAGNOSIS — Z955 Presence of coronary angioplasty implant and graft: Secondary | ICD-10-CM | POA: Diagnosis not present

## 2024-05-01 DIAGNOSIS — D72829 Elevated white blood cell count, unspecified: Secondary | ICD-10-CM | POA: Diagnosis not present

## 2024-05-01 DIAGNOSIS — D649 Anemia, unspecified: Secondary | ICD-10-CM | POA: Diagnosis not present

## 2024-05-01 DIAGNOSIS — I2510 Atherosclerotic heart disease of native coronary artery without angina pectoris: Secondary | ICD-10-CM | POA: Diagnosis not present

## 2024-05-01 DIAGNOSIS — R002 Palpitations: Secondary | ICD-10-CM | POA: Diagnosis present

## 2024-05-01 DIAGNOSIS — E785 Hyperlipidemia, unspecified: Secondary | ICD-10-CM | POA: Diagnosis present

## 2024-05-01 LAB — CBC
HCT: 38.4 % (ref 36.0–46.0)
Hemoglobin: 11.6 g/dL — ABNORMAL LOW (ref 12.0–15.0)
MCH: 23.3 pg — ABNORMAL LOW (ref 26.0–34.0)
MCHC: 30.2 g/dL (ref 30.0–36.0)
MCV: 77.3 fL — ABNORMAL LOW (ref 80.0–100.0)
Platelets: 417 K/uL — ABNORMAL HIGH (ref 150–400)
RBC: 4.97 MIL/uL (ref 3.87–5.11)
RDW: 17.3 % — ABNORMAL HIGH (ref 11.5–15.5)
WBC: 13.1 K/uL — ABNORMAL HIGH (ref 4.0–10.5)
nRBC: 0 % (ref 0.0–0.2)

## 2024-05-01 LAB — BASIC METABOLIC PANEL WITH GFR
Anion gap: 13 (ref 5–15)
BUN: 13 mg/dL (ref 6–20)
CO2: 22 mmol/L (ref 22–32)
Calcium: 9.6 mg/dL (ref 8.9–10.3)
Chloride: 102 mmol/L (ref 98–111)
Creatinine, Ser: 1.05 mg/dL — ABNORMAL HIGH (ref 0.44–1.00)
GFR, Estimated: >60 mL/min (ref >60)
Glucose, Bld: 127 mg/dL — ABNORMAL HIGH (ref 70–99)
Potassium: 4.5 mmol/L (ref 3.5–5.1)
Sodium: 137 mmol/L (ref 135–145)

## 2024-05-01 LAB — TROPONIN I (HIGH SENSITIVITY): Troponin I (High Sensitivity): 2 ng/L (ref <18)

## 2024-05-01 NOTE — Telephone Encounter (Signed)
 We received a disability form from [employer].  I called patient and informed her of the $29 form completion fee and that she needs to sign a release of information when she comes in. She said that she's already completed the form and that Dr. Michele only needs to sign it.  I reiterated that we are required to collect the $29 fee.  The patient said that she will speak with Dr. Michele about that and proceeded to hang up the phone.  I will place the form in Tolia's box along with the forms that the patient needs to sign.  Please let me know how to proceed.

## 2024-05-01 NOTE — ED Provider Triage Note (Signed)
  Emergency Medicine Provider Triage Evaluation Note  Casey [name] , a 53 y.o. female  was evaluated in triage.  Pt complains of substernal central chest tightness that started last night around 2300.  Associated with some nausea vomiting and a little bit of shortness of breath.  Patient has known cardiac disease had a stent placed in November 2024.  Patient states this does remind her of when she had heart trouble.  Patient not able to take aspirin .  But she is on Brilinta .  No leg swelling.  No fevers.  Review of Systems  Positive: Chest pain, shortness of breath, nausea vomiting. Negative: Leg swelling  Physical Exam  BP 130/77 (BP Location: Right Arm)   Pulse 98   Temp 98.4 F (36.9 C) (Oral)   Resp 19   Ht 1.6 m (5' 3)   Wt 102.1 kg   SpO2 98%   BMI 39.86 kg/m  Gen: Awake, no distress   Resp: Normal effort clear to auscultation bilateral. Heart regular rate and rhythm MSK: Moves extremities without difficulty no edema Other:    Medical Decision Making  Medically screening exam initiated at 8:15 PM.  Appropriate orders placed.  Casey [name] was informed that the remainder of the evaluation will be completed by another provider, this initial triage assessment does not replace that evaluation, and the importance of remaining in the ED until their evaluation is complete.  Patient with known coronary artery disease.  Has a stent.  EKG without significant changes compared to previous.  The patient will need troponins x 2 chest x-ray CBC and basic metabolic panel.  Patient states that the pain is very minimal at the moment.   Casey Betancur, MD 05/01/24 2022

## 2024-05-01 NOTE — ED Triage Notes (Signed)
 Arrives [facility] for central chest pains beginning ~2330 last night that doubled her over in pain. Says pain similair to November when she had cardiac stent placed.   Temporary improvement after 3-4 nitr.o. Allergy  to Aspirin .

## 2024-05-02 ENCOUNTER — Ambulatory Visit (HOSPITAL_COMMUNITY)
Admission: RE | Admit: 2024-05-02 | Discharge: 2024-05-02 | Disposition: A | Discharge status: Home / Self Care | Source: Ambulatory Visit | Attending: Cardiovascular Disease | Admitting: Cardiovascular Disease

## 2024-05-02 DIAGNOSIS — R002 Palpitations: Secondary | ICD-10-CM

## 2024-05-02 DIAGNOSIS — E785 Hyperlipidemia, unspecified: Secondary | ICD-10-CM | POA: Insufficient documentation

## 2024-05-02 DIAGNOSIS — I10 Essential (primary) hypertension: Secondary | ICD-10-CM

## 2024-05-02 DIAGNOSIS — I25110 Atherosclerotic heart disease of native coronary artery with unstable angina pectoris: Secondary | ICD-10-CM

## 2024-05-02 LAB — TROPONIN I (HIGH SENSITIVITY): Troponin I (High Sensitivity): 2 ng/L (ref <18)

## 2024-05-02 MED ORDER — LIDOCAINE 5 % EX PTCH: 1.0000 | MEDICATED_PATCH | CUTANEOUS | Status: DC

## 2024-05-02 MED ORDER — REGADENOSON 0.4 MG/5ML IV SOLN
INTRAVENOUS | Status: AC
  Filled 2024-05-02: qty 5

## 2024-05-02 MED ORDER — REGADENOSON 0.4 MG/5ML IV SOLN
0.4000 mg | Freq: Once | INTRAVENOUS | Status: AC
  Administered 2024-05-02: 0.4 mg via INTRAVENOUS

## 2024-05-02 MED ORDER — TECHNETIUM TC 99M TETROFOSMIN IV KIT
32.7000 | PACK | Freq: Once | INTRAVENOUS | Status: AC | PRN
Start: 2024-05-02 — End: 2024-05-02
  Administered 2024-05-02: 32.7 via INTRAVENOUS

## 2024-05-02 MED ORDER — LIDOCAINE-EPINEPHRINE-TETRACAINE (LET) TOPICAL GEL: 6.0000 mL | Freq: Once | TOPICAL | Status: DC

## 2024-05-02 MED ORDER — LIDOCAINE-EPINEPHRINE-TETRACAINE (LET) TOPICAL GEL
3.0000 mL | Freq: Once | TOPICAL | Status: AC
  Administered 2024-05-02: 3 mL via TOPICAL
  Filled 2024-05-02: qty 3

## 2024-05-02 MED ORDER — TECHNETIUM TC 99M TETROFOSMIN IV KIT
10.9000 | PACK | Freq: Once | INTRAVENOUS | Status: AC | PRN
Start: 2024-05-02 — End: 2024-05-02
  Administered 2024-05-02: 10.9 via INTRAVENOUS

## 2024-05-02 NOTE — ED Provider Notes (Signed)
 [place] EMERGENCY DEPARTMENT AT [facility] Provider Note   CSN: [id number] Arrival date & time: 05/01/24  1948     Patient presents with: Chest Pain   Casey [name] is a 53 y.o. female.    Chest Pain  Patient is a 53 year old female with past medical history significant for CAD status post stent placement in November 2024 who is present emergency room today with complaints of sternal chest pain that began around 11:30 PM Monday and has been constant since describes as achy sternal pain hurts more with twisting and moving as well as touch denies new shortness of breath but endorses somewhat chronic shortness of breath.  No nausea or vomiting this patient follows with Dr. Michele of cardiology.  Is scheduled for a cardiac perfusion study today but came to the emergency room because of concern for chest pain.  No fevers.  No recent surgeries, hospitalization, long travel, hemoptysis, estrogen containing OCP, cancer history.  No unilateral leg swelling.  No history of PE or VTE.        Prior to Admission medications   Medication Sig Start Date End Date Taking? Authorizing Provider  acetaminophen  (TYLENOL ) 500 MG tablet Take 1,000 mg by mouth every 12 (twelve) hours as needed for mild pain or moderate pain.    [provider]  albuterol  (PROVENTIL ) (2.5 MG/3ML) 0.083% nebulizer solution Take 3 mLs (2.5 mg total) by nebulization every 4 (four) hours as needed for wheezing or shortness of breath. 04/18/24   Hunsucker, Donnice SAUNDERS, MD  aMILoride (MIDAMOR) 5 MG tablet Take 10 mg by mouth daily. 03/18/24   [provider]  atorvastatin  (LIPITOR) 40 MG tablet Take 2 tablets (80 mg total) by mouth daily. 01/19/24   Tolia, Sunit, DO  B Complex-C (B-COMPLEX WITH VITAMIN C) tablet Take 1 tablet by mouth daily. 09/06/19   [provider]  Cholecalciferol  (VITAMIN D3) 50 MCG (2000 UT) TABS Take 1 tablet by mouth daily.    [provider]  EPINEPHrine  (EPIPEN   2-PAK) 0.3 mg/0.3 mL IJ SOAJ injection Inject 0.3 mg into the muscle as needed for anaphylaxis. 04/24/24   Cari Arlean HERO, FNP  Evolocumab  (REPATHA  SURECLICK) 140 MG/ML SOAJ Inject 140 mg into the skin every 14 (fourteen) days. 12/27/23   Tolia, Sunit, DO  famotidine  (PEPCID ) 40 MG tablet Take 40 mg by mouth 2 (two) times daily. 12/20/23 12/19/24  [provider]  levocetirizine (XYZAL) 5 MG tablet Take 10 mg by mouth daily.    [provider]  losartan (COZAAR) 100 MG tablet Take 100 mg by mouth daily. 12/08/23 12/07/24  [provider]  losartan (COZAAR) 50 MG tablet Take 50 mg by mouth daily.    [provider]  mirabegron  ER (MYRBETRIQ ) 50 MG TB24 tablet Take 1 tablet (50 mg total) by mouth daily. 11/30/22   Jason Leita Repine, FNP  montelukast  (SINGULAIR ) 10 MG tablet Take 1 tablet (10 mg total) by mouth daily. 04/24/24   Ambs, Arlean HERO, FNP  nitroGLYCERIN  (NITROSTAT ) 0.4 MG SL tablet Place 1 tablet (0.4 mg total) under the tongue every 5 (five) minutes as needed for chest pain. 12/13/23   Tolia, Sunit, DO  oxycodone  (OXY-IR) 5 MG capsule Take 1 capsule (5 mg total) by mouth every 6 (six) hours as needed. 02/29/24   Gerome Maurilio HERO, PA-C  RABEprazole (ACIPHEX) 20 MG tablet Take 20 mg by mouth 2 (two) times daily.    [provider]  Secukinumab (COSENTYX SENSOREADY PEN) 150  MG/ML SOAJ Inject 150 mg into the skin. 03/01/24   [provider]  ticagrelor  (BRILINTA ) 90 MG TABS tablet Take 1 tablet (90 mg total) by mouth 2 (two) times daily. 01/12/24   Tolia, Sunit, DO  triamcinolone  cream (KENALOG ) 0.1 % Apply 1 Application topically 2 (two) times daily. 04/09/24   Vivienne Delon HERO, PA-C    Allergies: Albuterol  sulfate, Corticosteroids, Silicone, Tape, Wound dressings, Formoterol, Hydrocodone , Hydrocodone -acetaminophen , Nickel, Other, Oxycodone -acetaminophen , Salmeterol, Strawberry extract, Advair hfa [fluticasone-salmeterol], Proventil  hfa [albuterol ], Asa  [aspirin ], Cleocin [clindamycin], Clindamycin/lincomycin, Fludrocortisone acetate, Gabapentin, Hydrochlorothiazide, Imipramine, Ipratropium bromide, Medroxyprogesterone, Meloxicam, Metformin and related, Nystatin, Penicillins, Pred forte [prednisolone], Prednisone, Solu-medrol  [methylprednisolone ], Sulfa antibiotics, Tegaderm ag mesh [silver], Toradol  [ketorolac  tromethamine ], and Tramadol    Review of Systems  Cardiovascular:  Positive for chest pain.    Updated Vital Signs BP (!) 138/94 (BP Location: Right Arm)   Pulse 94   Temp 98.2 F (36.8 C)   Resp 16   Ht 5' 3 (1.6 m)   Wt 102.1 kg   SpO2 97%   BMI 39.86 kg/m   Physical Exam Vitals and nursing note reviewed.  Constitutional:      General: She is not in acute distress. HENT:     Head: Normocephalic and atraumatic.     Nose: Nose normal.  Eyes:     General: No scleral icterus. Cardiovascular:     Rate and Rhythm: Normal rate and regular rhythm.     Pulses: Normal pulses.     Heart sounds: Normal heart sounds.  Pulmonary:     Effort: Pulmonary effort is normal. No respiratory distress.     Breath sounds: No wheezing.     Comments: Convincingly tender sternum with no overlying cellulitis fluctuant abscess or rash.  This perfectly reproduces the patient's pain. Chest:     Chest wall: Tenderness present.  Abdominal:     Palpations: Abdomen is soft.     Tenderness: There is no abdominal tenderness.  Musculoskeletal:     Cervical back: Normal range of motion.     Right lower leg: No edema.     Left lower leg: No edema.  Skin:    General: Skin is warm and dry.     Capillary Refill: Capillary refill takes less than 2 seconds.  Neurological:     Mental Status: She is alert. Mental status is at baseline.  Psychiatric:        Mood and Affect: Mood normal.        Behavior: Behavior normal.     (all labs ordered are listed, but only abnormal results are displayed) Labs Reviewed  BASIC METABOLIC PANEL WITH GFR -  Abnormal; Notable for the following components:      Result Value   Glucose, Bld 127 (*)    Creatinine, Ser 1.05 (*)    All other components within normal limits  CBC - Abnormal; Notable for the following components:   WBC 13.1 (*)    Hemoglobin 11.6 (*)    MCV 77.3 (*)    MCH 23.3 (*)    RDW 17.3 (*)    Platelets 417 (*)    All other components within normal limits  TROPONIN I (HIGH SENSITIVITY)  TROPONIN I (HIGH SENSITIVITY)    EKG: EKG Interpretation Date/Time:  Tuesday May 01 2024 20:07:56 EDT Ventricular Rate:  85 PR Interval:  154 QRS Duration:  84 QT Interval:  358 QTC Calculation: 426 R Axis:   -35  Text Interpretation: Normal sinus rhythm Left axis  deviation Abnormal ECG When compared with ECG of 08-Apr-2024 16:20, PREVIOUS ECG IS PRESENT No significant change since last tracing Confirmed by Zackowski, Scott [phone]) on 05/01/2024 8:15:40 PM  Radiology: [facility] Chest 2 View Result Date: 05/01/2024 CLINICAL DATA:  Chest pain EXAM: CHEST - 2 VIEW COMPARISON:  04/08/2024 FINDINGS: The heart size and mediastinal contours are within normal limits. Both lungs are clear. The visualized skeletal structures are unremarkable. IMPRESSION: No active cardiopulmonary disease. Electronically Signed   By: Franky Crease M.D.   On: 05/01/2024 20:32     Procedures   Medications Ordered in the ED  lidocaine -EPINEPHrine -tetracaine  (LET) topical gel (3 mLs Topical Given 05/02/24 0106)                                     Medical Decision Making Amount and/or Complexity of Data Reviewed Labs: ordered. Radiology: ordered.   This patient presents to the ED for concern of CP, this involves a number of treatment options, and is a complaint that carries with it a moderate to high risk of complications and morbidity. A differential diagnosis was considered for the patient's symptoms which is discussed below:   The emergent causes of chest pain include: Acute coronary syndrome, tamponade,  pericarditis/myocarditis, aortic dissection, pulmonary embolism, tension pneumothorax, pneumonia, and esophageal rupture.   Co morbidities: Discussed in HPI   Brief History:  Patient is a 53 year old female with past medical history significant for CAD status post stent placement in November 2024 who is present emergency room today with complaints of sternal chest pain that began around 11:30 PM Monday and has been constant since describes as achy sternal pain hurts more with twisting and moving as well as touch denies new shortness of breath but endorses somewhat chronic shortness of breath.  No nausea or vomiting this patient follows with Dr. Michele of cardiology.  Is scheduled for a cardiac perfusion study today but came to the emergency room because of concern for chest pain.  No fevers.  No recent surgeries, hospitalization, long travel, hemoptysis, estrogen containing OCP, cancer history.  No unilateral leg swelling.  No history of PE or VTE.     EMR reviewed including pt PMHx, past surgical history and past visits to ER.   See HPI for more details   Lab Tests:   I ordered and independently interpreted labs. Labs notable for Leukocytosis of 13.1 mild anemia creatinine and BNP normal troponin x 2 low and at baseline 2, 2 chest x-ray unremarkable EKG nonischemic  .  Cardiac Monitoring:  The patient was maintained on a cardiac monitor.  I personally viewed and interpreted the cardiac monitored which showed an underlying rhythm of: NSR EKG non-ischemic   Medicines ordered:  I ordered medication including topical lidocaine  on chest for pain Reevaluation of the patient after these medicines showed that the patient improved I have reviewed the patients home medicines and have made adjustments as needed   Critical Interventions:     Consults/Attending Physician   I requested consultation with Dr. Elmira of cardiology,  and discussed lab and imaging findings as  well as pertinent plan - they recommend: Given the reassuring workup and the history that I provided cardiology they recommend discharge so that they can do the myocardial perfusion study.  I did shared decision-making conversation with patient and she thinks this is also reasonable plan will discharge at this time.   Reevaluation:  After the  interventions noted above I re-evaluated patient and found that they have : Improved   Social Determinants of Health:  .    Problem List / ED Course:  Chest pain likely musculoskeletal given my exam and reassuring cardiac workup.  Myocardial perfusion study using to be done later today and follow-up with cardiology tomorrow.  I discussed with cardiology my attending/supervising physician and we are all in agreement as well as the patient that this is a reasonable next step.  Will discharge home at this time return precautions discussed   Dispostion:  After consideration of the diagnostic results and the patients response to treatment, I feel that the patent would benefit from outpatient follow-up.      Final diagnoses:  Chest pain, unspecified type    ED Discharge Orders     None          Neldon Hamp RAMAN, [place] 05/02/24 1421    Levander Houston, MD 05/02/24 [phone]

## 2024-05-02 NOTE — Discharge Instructions (Signed)
 Please get your myocardial perfusion study done.  Please return to the emergency room if you develop any new or concerning symptoms such as worsening pain, more difficulty breathing, coughing up blood or any other new or concerning symptoms.

## 2024-05-03 ENCOUNTER — Ambulatory Visit: Attending: Cardiology | Admitting: Cardiology

## 2024-05-03 ENCOUNTER — Ambulatory Visit: Payer: Self-pay | Admitting: Cardiovascular Disease

## 2024-05-03 ENCOUNTER — Telehealth: Payer: Self-pay | Admitting: *Deleted

## 2024-05-03 ENCOUNTER — Encounter: Payer: Self-pay | Admitting: Cardiology

## 2024-05-03 VITALS — BP 122/88 | HR 88 | Resp 16 | Ht 63.0 in | Wt 209.0 lb

## 2024-05-03 DIAGNOSIS — I10 Essential (primary) hypertension: Secondary | ICD-10-CM

## 2024-05-03 DIAGNOSIS — E66812 Obesity, class 2: Secondary | ICD-10-CM

## 2024-05-03 DIAGNOSIS — E7800 Pure hypercholesterolemia, unspecified: Secondary | ICD-10-CM

## 2024-05-03 DIAGNOSIS — I25118 Atherosclerotic heart disease of native coronary artery with other forms of angina pectoris: Secondary | ICD-10-CM | POA: Diagnosis not present

## 2024-05-03 DIAGNOSIS — Z903 Acquired absence of stomach [part of]: Secondary | ICD-10-CM | POA: Diagnosis not present

## 2024-05-03 DIAGNOSIS — Z6837 Body mass index (BMI) 37.0-37.9, adult: Secondary | ICD-10-CM

## 2024-05-03 LAB — MYOCARDIAL PERFUSION IMAGING
LV dias vol: 48 mL (ref 46–106)
LV sys vol: 14 mL (ref 3.8–5.2)
Nuc Stress EF: 71 %
Peak HR: 107 {beats}/min
Rest HR: 79 {beats}/min
Rest Nuclear Isotope Dose: 10.9 mCi
SDS: 2
SRS: 0
SSS: 2
ST Depression (mm): 0 mm
Stress Nuclear Isotope Dose: 32.7 mCi
TID: 1.12

## 2024-05-03 NOTE — Telephone Encounter (Signed)
 Michele, Sunit, DO to [facility]riage ([employer]) ST   05/03/24  5:07 PM Note Called her after the office visit to discuss her stress test results but she did not answer.  We tried again Banker) at 5pm and was not able to reach her.  Will try again.    Sunit [place], DO, FACC

## 2024-05-03 NOTE — Telephone Encounter (Signed)
 Attempted to call patent for Dr Michele. No answer unable to leave a message

## 2024-05-03 NOTE — Telephone Encounter (Signed)
 Called her after the office visit to discuss her stress test results but she did not answer.  We tried again Banker) at 5pm and was not able to reach her.  Will try again.   Charlottie Per[place], DO, FACC

## 2024-05-03 NOTE — Progress Notes (Signed)
 Cardiology Office Note:  .   Date:  05/03/2024  ID:  Casey [name], DOB 09/28/1970, MRN [id number] PCP:  Iven Lang [facility]IG[facility]ERS  Former Cardiology Providers: Dr. Bernie [facility] Providers Cardiologist:  Madonna Large, DO , [facility] (established care 10/28/23) Electrophysiologist:  None  Click to update primary MD,subspecialty MD or APP then REFRESH:1}    Chief Complaint  Patient presents with   Coronary artery disease involving native coronary artery of   Follow-up   Hospitalization Follow-up    History of Present Illness: .   Casey [name] is a 53 y.o. 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   Formally under the care of Dr. Bernie and transitioned her care to me in March 2025.   In November 2024 she presented to the hospital with chest pain and eventually had a left heart catheterization and underwent coronary intervention.  Since then she has been focusing on improving her modifiable cardiovascular risk factors for secondary prevention.  Patient is allergic to aspirin  and therefore is on Brilinta .  She had a relook 1 month later because of chest pain which noted patent stent with minimal nonobstructive CAD.  Her overall functional capacity is limited due to rheumatoid arthritis flareup.  With regards to lipid management she is on maximally tolerated dose of statin therapy and also on Repatha .  She was last seen in March 2025 at which time she endorsed palpitations and underwent cardiac monitor.  Given the results of the cardiac monitor AV nodal blocking agents were recommended.  However, patient stated that she did not want to  take it because it did not work in the past.  Since then patient was seen by my partner Dr. Court on 03/21/2024 for chest pain evaluation.  Stress test was ordered.  Since then she has had ER visits for chest pain evaluation.  She is also submitted disability paperwork 2 days ago on 05/01/2024 which were actually due on 04/30/2024.  She presents today for follow-up after having her stress test done yesterday 05/02/2024.  Stress test results are still pending.  Unfortunately, the stress test results were not available at the time of the appointment this morning.  I was apologetic as these results were not available but assured her that I would call her back once they are reported.  Chest pain: Substernally located. Describes it as  doubling the over with pain Intermittent. Sharp. Worse with effort related activities Has improved with sublingual nitroglycerin  tablets Last episode was 24 hours ago As mentioned above she has been to ED for evaluation  Patient is concerned that she may have disease progression and other arteries which were noted to be nonobstructive in the past.  Review of Systems: .   Review of Systems  Constitutional: Positive for malaise/fatigue.  Cardiovascular:  Positive for dyspnea on exertion. Negative for chest pain, claudication, irregular heartbeat, leg swelling, near-syncope, orthopnea, palpitations, paroxysmal nocturnal dyspnea and syncope.  Respiratory:  Negative for shortness of breath.   Hematologic/Lymphatic: Negative for bleeding problem.    Studies Reviewed:   EKG: Patient refused EKG today  Echocardiogram: 09/05/2023  1. Left ventricular ejection fraction, by estimation, is 60 to 65%. The left ventricle has  normal function. The left ventricle has no regional  wall motion abnormalities. There is mild left ventricular hypertrophy. Left ventricular diastolic parameters are consistent with Grade II diastolic dysfunction (pseudonormalization). The average left  ventricular global longitudinal strain is -16.3 %. The global longitudinal strain is abnormal.   2. Right ventricular systolic function is normal. The right ventricular  size is normal. There is normal pulmonary artery systolic pressure.   3. There is no evidence of cardiac tamponade.   4. The mitral valve is normal in structure. No evidence of mitral valve regurgitation. No evidence of mitral stenosis.   5. The aortic valve is normal in structure. Aortic valve regurgitation is not visualized. No aortic stenosis is present.   6. The inferior vena cava is normal in size with greater than 50% respiratory variability, suggesting right atrial pressure of 3 mmHg.   Left Heart Catheterization 08/01/23: Hemodynamic data: LV: 139/-2, EDP 15 mmHg.  Ao 127/78, mean 99 mmHg.  No pressure gradient across the aortic valve.   Angiographic data: LV: Normal LV systolic function, EF 55 to 60%.  No regional wall motion abnormality in the RAO projection. LM: Large-caliber vessel.  It is smooth and normal. LAD: Gives origin to large D1.  Just after the D1, previously placed Synergy 3.0 x 20 mm stent on 08/30/2022 is widely patent.  There is mild disease in the mid to distal LAD and apical LAD. LCx: Gives origin to a very small OM1, large OM 2 and continues in the AV groove.  There is mild disease noted in the proximal segment. RCA: Large-caliber vessel nondominant vessel.  Gives origin to large PDA and a larger PL branch which has ostial 40% stenosis.  RCA itself is mildly diffusely diseased.  Nuclear stress test: 05/02/2024   The study is normal. The study is low risk.   No ST deviation was noted.   LV perfusion is normal. There is no evidence of ischemia. There is no evidence of infarction.   Left ventricular function is normal. Nuclear stress EF: 71%. The left ventricular ejection fraction is hyperdynamic (>65%). End diastolic cavity size is normal. End systolic cavity size is normal. No evidence of transient  ischemic dilation (TID) noted.   CT images were obtained for attenuation correction and were examined for the presence of coronary calcium  when appropriate.   Coronary calcium  assessment not performed due to prior revascularization.   Prior study not available for comparison.  RADIOLOGY: NA  Risk Assessment/Calculations:   NA   Labs:       Latest Ref Rng & Units 05/01/2024    8:03 PM 04/08/2024    4:51 PM 04/06/2024    1:25 AM  CBC  WBC 4.0 - 10.5 K/uL 13.1  5.4  9.6   Hemoglobin 12.0 - 15.0 g/dL 88.3  89.4  88.7   Hematocrit 36.0 - 46.0 % 38.4  34.5  35.0   Platelets 150 - 400 K/uL 417  320  321        Latest Ref Rng & Units 05/01/2024    8:03 PM 04/08/2024    4:51 PM 04/06/2024    1:25 AM  BMP  Glucose 70 - 99 mg/dL 872  897  886   BUN 6 - 20 mg/dL 13  12  11    Creatinine 0.44 - 1.00 mg/dL 8.94  8.96  9.03   Sodium 135 - 145 mmol/L 137  137  135   Potassium 3.5 - 5.1 mmol/L 4.5  3.8  3.9  Chloride 98 - 111 mmol/L 102  105  98   CO2 22 - 32 mmol/L 22  23  22    Calcium  8.9 - 10.3 mg/dL 9.6  8.4  9.9       Latest Ref Rng & Units 05/01/2024    8:03 PM 04/08/2024    4:51 PM 04/06/2024    1:25 AM  CMP  Glucose 70 - 99 mg/dL 872  897  886   BUN 6 - 20 mg/dL 13  12  11    Creatinine 0.44 - 1.00 mg/dL 8.94  8.96  9.03   Sodium 135 - 145 mmol/L 137  137  135   Potassium 3.5 - 5.1 mmol/L 4.5  3.8  3.9   Chloride 98 - 111 mmol/L 102  105  98   CO2 22 - 32 mmol/L 22  23  22    Calcium  8.9 - 10.3 mg/dL 9.6  8.4  9.9     Lab Results  Component Value Date   CHOL 249 (H) 09/06/2023   HDL 73 09/06/2023   LDLCALC 140 (H) 09/06/2023   TRIG 205 (H) 09/06/2023   CHOLHDL 3.4 09/06/2023   Recent Labs    07/02/23 0321  LIPOA 58.1*   No components found for: NTPROBNP Recent Labs    09/19/23 1006 11/04/23 1346  PROBNP 153 316*   Recent Labs    11/04/23 1346  TSH 0.983    Physical Exam:    Today's Vitals   05/03/24 0807  BP: 122/88  Pulse: 88  Resp: 16  SpO2:  99%  Weight: 209 lb (94.8 kg)  Height: 5' 3 (1.6 m)   Body mass index is 37.02 kg/m. Wt Readings from Last 3 Encounters:  05/03/24 209 lb (94.8 kg)  05/01/24 225 lb (102.1 kg)  04/18/24 225 lb (102.1 kg)    Physical Exam  Constitutional: No distress.  hemodynamically stable  HENT:  Poor dentition  Neck: No JVD present.  Cardiovascular: Normal rate, regular rhythm, S1 normal and S2 normal. Exam reveals no gallop, no S3 and no S4.  No murmur heard. Pulmonary/Chest: Effort normal and breath sounds normal. No stridor. She has no wheezes. She has no rales.  Musculoskeletal:        General: No edema.     Cervical back: Neck supple.  Skin: Skin is warm.     Impression & Recommendation(s):  Impression:   ICD-10-CM   1. Coronary artery disease involving native coronary artery of native heart with other form of angina pectoris  I25.118     2. Essential hypertension  I10     3. Pure hypercholesterolemia  E78.00     4. H/O gastric sleeve  Z90.3     5. Class 2 severe obesity due to excess calories with serious comorbidity and body mass index (BMI) of 37.0 to 37.9 in adult  E66.812    E66.01    Z68.37        Recommendation(s):  Coronary artery disease involving native coronary artery of native heart with precordial pain Status post PCI to the proximal/mid LAD, 07/01/2023 Last episode of chest pain 24 hours ago. Currently asymptomatic. Refused an EKG in the office. At the time of the office evaluation cardiac stress test results were not available.  However called her today at approximately 6:30 PM to go over the results.  Patient is made aware that the myocardial blood flow is normal and overall it is a low risk study. Patient still remains very concerned for obstructive  CAD as her prior workup was also unremarkable. Given her comorbidities, risk factors, known CAD, multiple ER visits, and continued chest pain we discussed undergoing left heart catheterization with possible  intervention as balanced ischemia cannot be ruled out by stress test.  Discussed the risks, benefits, and alternatives to coronary angiography.  Patient states that she would like to discuss this further with her husband prior to making an informed decision.  She will call us  back. In the interim if her symptoms increase in intensity frequency or duration patient is advised to go to the ER via EMS for further evaluation and management. I have also suggested she go for second opinion regarding her cardiovascular care.  Informed Consent   Shared Decision Making/Informed Consent The risks [stroke (1 in 1000), death (1 in 1000), kidney failure [usually temporary] (1 in 500), bleeding (1 in 200), allergic reaction [possibly serious] (1 in 200)], benefits (diagnostic support and management of coronary artery disease) and alternatives of a cardiac catheterization were discussed in detail with Ms. Passon and she is willing to proceed.     Essential hypertension Office blood pressures are well-controlled. Continue losartan 50 mg p.o. daily  Pure hypercholesterolemia Currently on Lipitor 80 mg p.o. daily. Continue Repatha . Patient is due for repeat fasting lipids.  H/O gastric sleeve Class 2 severe obesity due to excess calories with serious comorbidity and body mass index (BMI) of 37.0 to 37.9 in adult [facility]) Body mass index is 37.02 kg/m. Has lost weight since last office visit, congratulated on her efforts I reviewed with her importance of diet, regular physical activity/exercise as tolerated, weight loss.   Recommended avoiding overexertion until ischemic workup is complete.  Orders Placed:  No orders of the defined types were placed in this encounter.  Patient wanted her disability paperwork filled out and sent over to [facility]. Agreed to fill them out on the basis of known stable coronary artery disease, prior coronary interventions, preserved LVEF.  Patient is made aware that coronary artery  disease is a chronic comorbid condition.  In addition, she is made aware of our office policy charges $29 administrative fee for completion and submission of forms.  Patient states that she is unable to pay this at this time.  She wants us  to hold off on filling out her paperwork at this time.  Final Medication List:    No orders of the defined types were placed in this encounter.   There are no discontinued medications.    Current Outpatient Medications:    acetaminophen  (TYLENOL ) 500 MG tablet, Take 1,000 mg by mouth every 12 (twelve) hours as needed for mild pain or moderate pain., Disp: , Rfl:    albuterol  (PROVENTIL ) (2.5 MG/3ML) 0.083% nebulizer solution, Take 3 mLs (2.5 mg total) by nebulization every 4 (four) hours as needed for wheezing or shortness of breath., Disp: 540 mL, Rfl: 12   aMILoride (MIDAMOR) 5 MG tablet, Take 10 mg by mouth daily., Disp: , Rfl:    atorvastatin  (LIPITOR) 40 MG tablet, Take 2 tablets (80 mg total) by mouth daily., Disp: 180 tablet, Rfl: 3   B Complex-C (B-COMPLEX WITH VITAMIN C) tablet, Take 1 tablet by mouth daily., Disp: , Rfl:    Cholecalciferol  (VITAMIN D3) 50 MCG (2000 UT) TABS, Take 1 tablet by mouth daily., Disp: , Rfl:    EPINEPHrine  (EPIPEN  2-PAK) 0.3 mg/0.3 mL IJ SOAJ injection, Inject 0.3 mg into the muscle as needed for anaphylaxis., Disp: 2 each, Rfl: 2   Evolocumab  (REPATHA  SURECLICK)  140 MG/ML SOAJ, Inject 140 mg into the skin every 14 (fourteen) days., Disp: 2 mL, Rfl: 11   famotidine  (PEPCID ) 40 MG tablet, Take 40 mg by mouth 2 (two) times daily., Disp: , Rfl:    levocetirizine (XYZAL) 5 MG tablet, Take 10 mg by mouth daily., Disp: , Rfl:    losartan (COZAAR) 100 MG tablet, Take 100 mg by mouth daily., Disp: , Rfl:    losartan (COZAAR) 50 MG tablet, Take 50 mg by mouth daily., Disp: , Rfl:    mirabegron  ER (MYRBETRIQ ) 50 MG TB24 tablet, Take 1 tablet (50 mg total) by mouth daily., Disp: 30 tablet, Rfl: 11   montelukast  (SINGULAIR ) 10 MG  tablet, Take 1 tablet (10 mg total) by mouth daily., Disp: 90 tablet, Rfl: 1   nitroGLYCERIN  (NITROSTAT ) 0.4 MG SL tablet, Place 1 tablet (0.4 mg total) under the tongue every 5 (five) minutes as needed for chest pain., Disp: 30 tablet, Rfl: 3   oxycodone  (OXY-IR) 5 MG capsule, Take 1 capsule (5 mg total) by mouth every 6 (six) hours as needed., Disp: 30 capsule, Rfl: 0   RABEprazole (ACIPHEX) 20 MG tablet, Take 20 mg by mouth 2 (two) times daily., Disp: , Rfl:    Secukinumab (COSENTYX SENSOREADY PEN) 150 MG/ML SOAJ, Inject 150 mg into the skin., Disp: , Rfl:    ticagrelor  (BRILINTA ) 90 MG TABS tablet, Take 1 tablet (90 mg total) by mouth 2 (two) times daily., Disp: 180 tablet, Rfl: 3   triamcinolone  cream (KENALOG ) 0.1 %, Apply 1 Application topically 2 (two) times daily., Disp: 30 g, Rfl: 0  Consent:   NA  Disposition:   6 months follow-up sooner if needed.   Her questions and concerns were addressed to her satisfaction. She voices understanding of the recommendations provided during this encounter.    Signed, Madonna Michele HAS, [facility]  A Division of [facility] [address], [place], [place] [address]  05/03/2024 6:56 PM

## 2024-05-03 NOTE — Telephone Encounter (Signed)
 Pt states she's not going to pay the $29 and the forms can't be sent or just thrown out.

## 2024-05-03 NOTE — Patient Instructions (Signed)
 Medication Instructions:   No changes *If you need a refill on your cardiac medications before your next appointment, please call your pharmacy*   Lab Work: Not needed    Testing/Procedures:  Not needed   Will contact you with your results  Follow-Up: At [facility], you and your health needs are our priority.  As part of our continuing mission to provide you with exceptional heart care, we have created designated Provider Care Teams.  These Care Teams include your primary Cardiologist (physician) and Advanced Practice Providers (APPs -  Physician Assistants and Nurse Practitioners) who all work together to provide you with the care you need, when you need it.     Your next appointment:   6 month(s)  The format for your next appointment:   In Person  Provider:   Madonna Large, DO

## 2024-05-04 NOTE — Telephone Encounter (Signed)
 Patient was seen 05/03/24

## 2024-05-08 ENCOUNTER — Ambulatory Visit (INDEPENDENT_AMBULATORY_CARE_PROVIDER_SITE_OTHER): Admitting: Student

## 2024-05-08 ENCOUNTER — Encounter: Attending: Physical Medicine & Rehabilitation | Admitting: Physical Medicine & Rehabilitation

## 2024-05-08 ENCOUNTER — Encounter (HOSPITAL_BASED_OUTPATIENT_CLINIC_OR_DEPARTMENT_OTHER): Payer: Self-pay | Admitting: Student

## 2024-05-08 ENCOUNTER — Ambulatory Visit: Payer: Self-pay | Admitting: Cardiovascular Disease

## 2024-05-08 ENCOUNTER — Encounter: Payer: Self-pay | Admitting: Physical Medicine & Rehabilitation

## 2024-05-08 VITALS — BP 122/76 | HR 103 | Temp 98.2°F | Resp 16 | Ht 63.0 in | Wt 207.6 lb

## 2024-05-08 VITALS — BP 112/79 | HR 95 | Ht 63.0 in | Wt 208.0 lb

## 2024-05-08 DIAGNOSIS — G8929 Other chronic pain: Secondary | ICD-10-CM

## 2024-05-08 DIAGNOSIS — R1112 Projectile vomiting: Secondary | ICD-10-CM | POA: Diagnosis not present

## 2024-05-08 DIAGNOSIS — M25512 Pain in left shoulder: Secondary | ICD-10-CM | POA: Insufficient documentation

## 2024-05-08 DIAGNOSIS — M1380 Other specified arthritis, unspecified site: Secondary | ICD-10-CM | POA: Diagnosis not present

## 2024-05-08 DIAGNOSIS — E119 Type 2 diabetes mellitus without complications: Secondary | ICD-10-CM

## 2024-05-08 DIAGNOSIS — R809 Proteinuria, unspecified: Secondary | ICD-10-CM

## 2024-05-08 DIAGNOSIS — M170 Bilateral primary osteoarthritis of knee: Secondary | ICD-10-CM

## 2024-05-08 DIAGNOSIS — R42 Dizziness and giddiness: Secondary | ICD-10-CM | POA: Diagnosis not present

## 2024-05-08 NOTE — Progress Notes (Unsigned)
 Established Patient Office Visit  Subjective   Patient ID: Casey [name], female    DOB: 1970/12/20  Age: 53 y.o. MRN: [id number]  Chief Complaint  Patient presents with   Medical Management of Chronic Issues    Follow up. Had MYOCARDIAL PERFUSION/CT RAD READ on 05/02/2024. Was told to get a second opinion by [facility] at  [facility].     HPI  Discussed the use of AI scribe software for clinical note transcription with the patient, who gave verbal consent to proceed.  History of Present Illness   Casey [name] is a 53 year old female who presents for disability paperwork and ongoing management of her medical issues.  She experiences ongoing gastrointestinal issues, including motility problems and explosive diarrhea, which significantly impact her daily life. Attempts to hold in bowel movements result in accidents at home. She is under the care of a GI specialist and anticipates an upcoming upper endoscopy due to persistent esophageal issues. Previous upper studies with contrast have been inconclusive as the contrast gets stuck in her chest.  She has a history of severe chest pain that led to an ER visit, where she was informed her heart was normal. A recent test indicated a trace amount of fluid in the stereosacral cartilage sac. There is a significant family history of heart disease, with her mother having had a large amount of fluid removed from her heart.  She experiences chronic pain and has had multiple ER visits related to this. She also has kidney disease, which is monitored with monthly lab work to check her potassium and magnesium  levels. Her potassium levels are maintained in the mid to high threes with medication, but there was a delay in receiving magnesium  results.  She experiences dizziness with minimal exertion, such as walking short distances or taking out the trash. This dizziness has not improved. She has not fallen recently but experiences dizziness after short  walks.  Her sleep is poor, with frequent awakenings every two to three hours, possibly due to pain and low oxygen levels at night. She is in the process of arranging a pulse oximetry study to assess her nighttime oxygen levels.  She is able to drive frequently but has limitations with physical activities. She can sit, stand, and walk intermittently with breaks, but experiences dizziness and back pain with prolonged activity. She can lift up to ten pounds for short periods.  She is scheduled for cataract surgery next month and is under the care of a cornea specialist at [facility]. She has not had a diabetic eye exam this year due to the upcoming surgery.  She reports that she is primarily consuming high sugar foods, which are the only foods she can keep down consistently.      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   Coronary artery disease s/p PCI/DES to LAD 07/01/2023 12/28/2022   Hyperlipidemia 10/07/2022   Hiatal hernia 10/07/2022   Esophageal candidiasis (HCC) 06/26/2022   Rheumatoid arthritis of multiple sites with 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 factor (HCC) 11/16/2021   De Quervain's tenosynovitis, left 07/31/2021   Status post placement of ureteral stent 02/06/2021  Proteinuria 08/12/2020   Environmental allergies 08/12/2020   History of kidney stones 08/12/2020   Gastroesophageal reflux disease with esophagitis without hemorrhage 08/01/2019   Slow transit constipation 08/01/2019   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   History of colon polyps 06/19/2019   Irregular astigmatism of both eyes 10/11/2018   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   Myopia with astigmatism and presbyopia 05/07/2016   Overactive bladder 11/18/2015   Incontinence 10/07/2015   Essential hypertension 06/27/2015   Cataract 05/06/2015   Dry eyes 05/06/2015   Crohn's disease (HCC) 04/21/2015   Osteoarthritis of both knees 04/21/2015   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/30/2014   History of abnormal cervical Pap smear 11/29/2014   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 Atopic dermatitis Formatting of this note might be different from the original. Formatting of this note might be different from the original. Atopic dermatitis Formatting of this note might be different from the original. Formatting of this note might be different from the original. Formatting of this note might be different from the or   Cataract 05/06/2015   Contusion of left wrist 07/08/2021   Coronary artery disease s/p PCI/DES to LAD 07/01/2023 12/28/2022   Crohn's disease (HCC) 04/21/2015   De Quervain's tenosynovitis, left 07/31/2021   Diabetes mellitus without complication (HCC)    Diabetes type 2, controlled (HCC) 04/21/2015   Dry eyes 05/06/2015   DVT (deep venous thrombosis) (HCC)    Dysfunction of both eustachian tubes 08/31/2012   Encounter for monitoring immunomodulating therapy 11/20/2021    Environmental allergies 08/12/2020   Esophageal candidiasis (HCC) 06/26/2022   Essential hypertension 06/27/2015   Fibrositis 08/28/2014   Formatting of this note might be different from the original. Fibromyalgia Formatting of this note might be different from the original. Formatting of this note might be different from the original. Fibromyalgia   Flank pain 11/24/2020   Gastroesophageal reflux disease with esophagitis without hemorrhage 08/01/2019   Formatting of this note might be different from the original. Formatting of this note might be different from the original. 07/2019: chronic symptoms of esophageal reflux since prior to sleeve gastrectomy surgery. Symptoms have worsened over the last 6 months with recent EGD findings of grade B esophagitis, irregular z line, erythematous mucosa, and evidence of sleeve gastrectomy. 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 Formatting of this note might be different from the original. 07/2019: 8 mm colonic polyp removed during colonoscopy, biopsy showed serrated polyp. Repeat colonoscopy recommended in one year. Formatting of this note might be different from the original. Added automatically from request for surgery [id number] Formatting of this note might b  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   History of kidney stones 08/12/2020   Hypercholesterolemia 10/07/2022   Hypertension, essential, benign 06/27/2015   Hypertensive disorder 08/28/2014   Formatting of this note might be different from the original. Hypertension Formatting of this note might be different from the original. Formatting of this note might be different from the original. 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 Keratoconus Formatting of this note might be different from the original. Formatting of this note might be different from the original. Keratoconus Formatting of this note might be different from the original. Formatting of this note might be different from the original. Keratoconus Formatting of this note might be different from the or   Microhematuria 06/27/2015   Migraine 08/28/2014   Formatting of this note might be different from the original. Migraine Formatting of this note might be different from the original. Formatting of this note might be different from the original. Migraine Formatting of this note might be different from the original. Migraine Formatting of this note might be different from the original. Migraine Formatting of this note might be different from the or   Migraine without aura and without status migrainosus, not intractable 08/28/2014   Formatting of this note might be different from the original. Formatting of this note might be different from the original. Formatting of this note might be different from the original. Migraine Formatting of this note might be different from the original. Migraine Formatting of this note might be different from the original. Formatting of this note might be different from the original. Migraine F   Mild intermittent asthma without complication 10/30/2008   Formatting of this note might be different from the original. Formatting of this note might be different from the original. Formatting of this note might be different from the original. Asthma Formatting of this note might be different from the original. Asthma Formatting of this note might be different from the original. Formatting of this note might be different from the original. 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 Formatting of this note might be different from the original. 01/25/2016 with Dr. Loney Pol at [facility]. [place] of this note might be different from the original. 01/25/2016 with Dr. Loney Pol at [facility]. [place] of this note might be different from the original. Formatting of this note might be different from    Seronegative inflammatory arthritis 08/28/2014   Formatting of this note might be different from the original. Formatting of this note might be different from the original. Arthritis; diagnosed as IBD related - 2012 Formatting of this note might be different from the original. Followed by rheumatologist. Had to change providers due to an insurance change earlier  this year resulting in patient not being able to have Remicade  for ~6 months. 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 Formatting of this note might be different from the original. Treated with PRN miralax . Recent colonoscopy 07/2019. Formatting of this note might be different from the original. Treated with PRN miralax . Recent colonoscopy 07/2019. Formatting of this note might be different from the original. Formatting of this note might be different f    Status post placement of ureteral stent 02/06/2021   Tooth infection 08/12/2020   Unstable angina (HCC) 07/29/2023   Ureteral stone 01/20/2022   Urinary tract infection, site not specified 06/26/2022   Social History   Tobacco Use   Smoking status: Never    Passive exposure: Never   Smokeless tobacco: Never  Vaping Use   Vaping status: Never Used  Substance Use Topics   Alcohol use: Not Currently    Comment: rare   Drug use: Never   Allergies  Allergen Reactions   Albuterol  Sulfate Shortness Of Breath    Other reaction(s): Other (See Comments)  can't breathe  Other reaction(s): Other (See Comments) can't breathe   Corticosteroids Dermatitis, Palpitations and Swelling    ABLE TO TOLERATE VIA IM AND IV Inhaled - HA    ocular irritation   Silicone Dermatitis and Other (See Comments)    Cast and Bandage Cover  severe skin breakdown Cast and Bandage Cover  Cannot tolerate tegaderm, needs IV 3000. Paper tape.  Rash after several hours of EKG leads, but willing to tolerate.  Can tolerate silk tape for short stretches.  severe skin breakdown  severe skin breakdown Cast and Bandage Cover   Tape     Other reaction(s): Other (See Comments), Other (See Comments), Other (See Comments)  severe skin breakdown  Cast and Bandage Cover  severe skin breakdown  severe skin breakdown   Wound Dressings Dermatitis, Hives and Other (See Comments)    severe skin breakdown Cast and Bandage Cover  severe skin breakdown  severe skin breakdown Cast and Bandage Cover  Cannot tolerate tegaderm, needs IV 3000. Paper tape.  Rash after several hours of EKG leads, but willing to tolerate.  Can tolerate silk tape for short stretches.  Cast and Bandage Cover   Formoterol Other (See Comments)    Other reaction(s): Headaches  Other reaction(s): Headaches  Other reaction(s): Headaches  Headaches  Other reaction(s): Headaches Other reaction(s): Headaches    Other reaction(s): Headaches    Headaches    Hydrocodone  Rash   Hydrocodone -Acetaminophen  Rash   Nickel Rash and Other (See Comments)   Other Rash    cast and bandage use  cast and bandage use  cast and bandage use  cast and bandage use    cast and bandage use cast and bandage use   Oxycodone -Acetaminophen  Other (See Comments)    headache   Salmeterol Rash   Strawberry Extract Hives    Full body rash from strawberry   Advair Hfa [Fluticasone-Salmeterol]     unknown   Proventil  Hfa [Albuterol ]     unknown   Asa [Aspirin ] Rash   Cleocin [Clindamycin] Rash   Clindamycin/Lincomycin Rash   Fludrocortisone Acetate Rash   Gabapentin Rash   Hydrochlorothiazide Rash   Imipramine Rash   Ipratropium Bromide Rash   Medroxyprogesterone Rash   Meloxicam Rash   Metformin And Related Rash   Nystatin Rash   Penicillins Rash  Pred Forte [Prednisolone] Rash   Prednisone Rash   Solu-Medrol  [Methylprednisolone ] Rash   Sulfa Antibiotics Rash   Tegaderm Ag Mesh [Silver] Rash   Toradol  [Ketorolac  Tromethamine ] Rash   Tramadol Rash      ROS Per HPI.    Objective:     BP 122/76   Pulse (!) 103   Temp 98.2 F (36.8 C) (Oral)   Resp 16   Ht 5' 3 (1.6 m)   Wt 207 lb 9.6 oz (94.2 kg)   SpO2 96%   BMI 36.77 kg/m  BP Readings from Last 3 Encounters:  05/08/24 122/76  05/08/24 112/79  05/03/24 122/88   Wt Readings from Last 3 Encounters:  05/08/24 207 lb 9.6 oz (94.2 kg)  05/08/24 208 lb (94.3 kg)  05/03/24 209 lb (94.8 kg)      Physical Exam Constitutional:      General: She is not in acute distress.    Appearance: Normal appearance. She is not ill-appearing.  HENT:     Head: Normocephalic and atraumatic.     Nose: Nose normal.  Eyes:     General: No scleral icterus.    Conjunctiva/sclera: Conjunctivae normal.  Cardiovascular:     Rate and Rhythm: Normal rate and regular rhythm.     Heart sounds: Normal heart sounds. No murmur heard.    No friction rub.  Pulmonary:     Effort: Pulmonary effort is  normal. No respiratory distress.     Breath sounds: Normal breath sounds. No wheezing, rhonchi or rales.  Musculoskeletal:     Right foot: No deformity or Charcot foot.     Left foot: No deformity or Charcot foot.  Feet:     Right foot:     Protective Sensation: 9 sites tested.  9 sites sensed.     Skin integrity: Skin integrity normal. No ulcer, skin breakdown, erythema or warmth.     Toenail Condition: Right toenails are normal.     Left foot:     Protective Sensation: 9 sites tested.  9 sites sensed.     Skin integrity: Skin integrity normal. No ulcer, skin breakdown, erythema or warmth.     Toenail Condition: Left toenails are normal.     Comments: Normal pulses Skin:    General: Skin is warm and dry.     Coloration: Skin is not jaundiced or pale.  Neurological:     General: No focal deficit present.     Mental Status: She is alert.  Psychiatric:        Mood and Affect: Mood normal.        Behavior: Behavior normal.      Results for orders placed or performed in visit on 05/08/24  Hemoglobin A1c  Result Value Ref Range   Hgb A1c MFr Bld 6.1 (H) 4.8 - 5.6 %   Est. average glucose Bld gHb Est-mCnc 128 mg/dL    Last CBC Lab Results  Component Value Date   WBC 13.1 (H) 05/01/2024   HGB 11.6 (L) 05/01/2024   HCT 38.4 05/01/2024   MCV 77.3 (L) 05/01/2024   MCH 23.3 (L) 05/01/2024   RDW 17.3 (H) 05/01/2024   PLT 417 (H) 05/01/2024   Last metabolic panel Lab Results  Component Value Date   GLUCOSE 127 (H) 05/01/2024   NA 137 05/01/2024   K 4.5 05/01/2024   CL 102 05/01/2024   CO2 22 05/01/2024   BUN 13 05/01/2024   CREATININE 1.05 (H) 05/01/2024   GFRNONAA >60  05/01/2024   CALCIUM  9.6 05/01/2024   PROT 8.0 01/07/2024   ALBUMIN 3.5 01/07/2024   BILITOT 0.5 01/07/2024   ALKPHOS 87 01/07/2024   AST 27 01/07/2024   ALT 18 01/07/2024   ANIONGAP 13 05/01/2024   Last lipids Lab Results  Component Value Date   CHOL 249 (H) 09/06/2023   HDL 73 09/06/2023    LDLCALC 140 (H) 09/06/2023   TRIG 205 (H) 09/06/2023   CHOLHDL 3.4 09/06/2023   Last hemoglobin A1c Lab Results  Component Value Date   HGBA1C 6.1 (H) 05/08/2024      The 10-year ASCVD risk score (Arnett DK, et al., 2019) is: 4.1%    Assessment & Plan:   Assessment and Plan    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 Chronic gastrointestinal motility disorder with ongoing symptoms of explosive diarrhea and esophageal issues. Previous upper studies with contrast have been inconclusive due to contrast retention in the esophagus. - Continue conservative management for gastrointestinal symptoms - Attend upcoming GI appointment for further evaluation and potential scheduling of an upper endoscopy  Chronic pain syndrome Chronic pain syndrome with multiple ER visits. Pain management is ongoing, and pain is affecting sleep quality, contributing to waking every 2-3 hours. - Await pain management consultation  Recurrent dizziness Recurrent dizziness, particularly after short walks or strenuous activities. ENT evaluation was negative. Nephrology suspects a possible electrolyte imbalance as a contributing factor. - Refer to neurology for further evaluation of dizziness - Continue monitoring with nephrology for potential electrolyte-related causes  Chronic kidney disease with proteinuria Follows with nephro. Chronic kidney disease with ongoing monitoring of electrolytes, particularly potassium and magnesium . Potassium levels have been maintained in the mid to high threes with current medication regimen. - Continue monthly electrolyte monitoring at [facility] - Follow up with nephrology on June 7th  Type 2 diabetes mellitus Chronic stable.  Controlled with diet. Type 2 diabetes mellitus with anticipated elevated A1c per patient due to high sugar intake. Difficulty maintaining a balanced diet due to gastrointestinal symptoms. - Check  A1c level  Osteoarthritis Osteoarthritis with intermittent disability affecting mobility. Experiences dizziness and back pain when bending over, limiting ability to perform certain activities.      I personally spent a total of 36 minutes in the care of the patient today including preparing to see the patient, getting/reviewing separately obtained history, performing a medically appropriate exam/evaluation, counseling and educating, placing orders, and documenting clinical information in the EHR.   Return in about 3 months (around 08/07/2024) for Chronic Followup.    Tammy Ericsson T Kamariah Fruchter, PA-C

## 2024-05-08 NOTE — Progress Notes (Addendum)
 While while while  Subjective:    Patient ID: Casey [name], female    DOB: 01-Feb-1971, 53 y.o.   MRN: [id number] From [facility] visit  The patient was seen by Dr. Corrie Sanders, a rheumatologist at [facility] on May 08, 2019. She reported a history of joint pain for approximately 11 years, although she had not noticed any joint swelling. She had previously been diagnosed by another rheumatologist with fibromyalgia. She had been treated with nonsteroidal anti-inflammatory drugs, gabapentin, duloxetine, and amitriptyline. Gabapentin was associated with nausea, and amitriptyline was associated with suicidal ideations in combination with duloxetine. In 2013, she was diagnosed by another rheumatologist, Dr. 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  HPI CC:  Joint pain at night , may be LE or UE pain depending on the day Referral for shoulder pain  Left shoulder doing a little better  Widespread body pain   Unable to take oral steroid , ASA allergy    Has taken oxycodone , and perhaps hydrocodone  from her husband, none in 1 wk although in the past this has caused a rash and she did not experience this.  Tramadol did not work caused palpitation and also chart indicates this caused a rash. 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 General activity 10 Relation with others 6 Enjoyment of life 7 What TIME of day is your pain at its worst? morning , evening, and night Sleep (in general) Poor  Pain is worse with: walking, bending, and standing Pain improves with: rest, heat/ice, and injections Relief from Meds: 10  walk without assistance how many minutes can you walk? 2 ability to climb steps?  yes do you drive?  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  yes  Any changes since last visit?  no Primary care Lang [place], [place] Rheumatologist [facility] Orthopedist [facility] Dr. Harden    Family History  Adopted: Yes  Problem Relation Age of Onset   Diabetes Mother    Hypertension Mother    Diabetes Father    Hypertension Father    Hypertension Sister    Diabetes Sister    Hypertension Brother    Diabetes Brother    Diabetes Paternal Grandmother    Cancer Other    Asthma Neg Hx    Allergic rhinitis Neg Hx    Atopy Neg Hx    Eczema Neg Hx    Social History   Socioeconomic History   Marital status: Married    Spouse name: Not on file   Number of children: 0   Years of education: Not on  file   Highest education level: Professional school degree (e.g., MD, DDS, DVM, JD)  Occupational History   Not on file  Tobacco Use   Smoking status: Never    Passive exposure: Never   Smokeless tobacco: Never  Vaping Use   Vaping status: Never Used  Substance and Sexual Activity   Alcohol use: Not Currently    Comment: rare   Drug use: Never   Sexual activity: Not Currently  Other Topics Concern   Not on file  Social History Narrative   2 dogs    Social Drivers of Health   Financial Resource  Strain: Low Risk  (02/12/2024)   Overall Financial Resource Strain (CARDIA)    Difficulty of Paying Living Expenses: Not very hard  Food Insecurity: No Food Insecurity (02/12/2024)   Hunger Vital Sign    Worried About Running Out of Food in the Last Year: Never true    Ran Out of Food in the Last Year: Never true  Transportation Needs: No Transportation Needs (02/12/2024)   PRAPARE - Administrator, Civil Service (Medical): No    Lack of Transportation (Non-Medical): No  Physical Activity: Inactive (02/12/2024)   Exercise Vital Sign    Days of Exercise per Week: 0 days    Minutes of Exercise per Session: Not on file  Stress: No Stress Concern Present (02/12/2024)   [employer] of Occupational Health - Occupational Stress Questionnaire    Feeling of Stress: Not at all  Social Connections: Moderately Integrated (02/12/2024)   Social Connection and Isolation Panel    Frequency of Communication with Friends and Family: More than three times a week    Frequency of Social Gatherings with Friends and Family: Once a week    Attends Religious Services: Never    Database administrator or Organizations: Yes    Attends Banker Meetings: 1 to 4 times per year    Marital Status: Married   Past Surgical History:  Procedure Laterality Date   BACK SURGERY     L5-S1   CERVICAL CONE BIOPSY  04/2000   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   CORONARY STENT INTERVENTION N/A 07/01/2023   Procedure: CORONARY STENT INTERVENTION;  Surgeon: Darron Deatrice LABOR, MD;  Location: [facility];  Service: Cardiovascular;  Laterality: N/A;   EYE SURGERY     GASTRIC BYPASS  2017   Sleve   LAPAROSCOPIC GASTRIC SLEEVE RESECTION     LEFT HEART CATH AND CORONARY ANGIOGRAPHY N/A 07/01/2023   Procedure: LEFT HEART CATH AND CORONARY ANGIOGRAPHY;  Surgeon: Darron Deatrice LABOR, MD;  Location: [facility];  Service: Cardiovascular;  Laterality: N/A;   LEFT HEART CATH AND CORONARY ANGIOGRAPHY N/A  08/01/2023   Procedure: LEFT HEART CATH AND CORONARY ANGIOGRAPHY;  Surgeon: Ladona Heinz, MD;  Location: [facility];  Service: Cardiovascular;  Laterality: N/A;   Low back disc surgery     06/2000   TOTAL KNEE ARTHROPLASTY Right 11/2016   Past Medical History:  Diagnosis Date   Albuminuria 06/27/2015   Anxiety 11/04/2014   Arm DVT (deep venous thromboembolism), acute, left (HCC) 08/17/2022   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/30/2014   Asthma    Atopic dermatitis 10/30/2008   Formatting of this note might be different from the original. Atopic dermatitis Formatting of this note might be different from the original. Formatting of this note might be different from the original. Atopic dermatitis Formatting of this note might be different  from the original. Formatting of this note might be different from the original. Formatting of this note might be different from the or   Cataract 05/06/2015   Contusion of left wrist 07/08/2021   Coronary artery disease s/p PCI/DES to LAD 07/01/2023 12/28/2022   Crohn's disease (HCC) 04/21/2015   De Quervain's tenosynovitis, left 07/31/2021   Diabetes mellitus without complication (HCC)    Diabetes type 2, controlled (HCC) 04/21/2015   Dry eyes 05/06/2015   DVT (deep venous thrombosis) (HCC)    Dysfunction of both eustachian tubes 08/31/2012   Encounter for monitoring immunomodulating therapy 11/20/2021   Environmental allergies 08/12/2020   Esophageal candidiasis (HCC) 06/26/2022   Essential hypertension 06/27/2015   Fibrositis 08/28/2014   Formatting of this note might be different from the original. Fibromyalgia Formatting of this note might be different from the original. Formatting of this note might be different from the original. Fibromyalgia   Flank pain 11/24/2020   Gastroesophageal reflux disease with esophagitis without hemorrhage 08/01/2019   Formatting of this note might be different from the original.  Formatting of this note might be different from the original. 07/2019: chronic symptoms of esophageal reflux since prior to sleeve gastrectomy surgery. Symptoms have worsened over the last 6 months with recent EGD findings of grade B esophagitis, irregular z line, erythematous mucosa, and evidence of sleeve gastrectomy. 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 Formatting of this note might be different from the original. 07/2019: 8 mm colonic polyp removed during colonoscopy, biopsy showed serrated polyp. Repeat colonoscopy recommended in one year. Formatting of this note might be different from the original. Added automatically from request for surgery [phone] Formatting of this note might b   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   History of kidney stones 08/12/2020   Hypercholesterolemia 10/07/2022   Hypertension, essential, benign 06/27/2015   Hypertensive disorder 08/28/2014   Formatting of this note might be different from the original. Hypertension Formatting of this note might be different from the original. Formatting of this note might be different from the original. 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 Keratoconus Formatting of this note might be different from the original. Formatting of this note might be different from the original. Keratoconus Formatting of this note might be different from the original. Formatting of this note might be different from the original. Keratoconus Formatting of this note might be different from the or   Microhematuria 06/27/2015    Migraine 08/28/2014   Formatting of this note might be different from the original. Migraine Formatting of this note might be different from the original. Formatting of this note might be different from the original. Migraine Formatting of this note might be different from the original. Migraine Formatting of this note might be different from the original. Migraine Formatting of this note might be different from the or   Migraine without aura and without status migrainosus, not intractable 08/28/2014   Formatting of this note might be different from the original. Formatting of this note might be different from the original. Formatting of this note might  be different from the original. Migraine Formatting of this note might be different from the original. Migraine Formatting of this note might be different from the original. Formatting of this note might be different from the original. Migraine F   Mild intermittent asthma without complication 10/30/2008   Formatting of this note might be different from the original. Formatting of this note might be different from the original. Formatting of this note might be different from the original. Asthma Formatting of this note might be different from the original. Asthma Formatting of this note might be different from the original. Formatting of this note might be different from the original. 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 Formatting of this note might be different from the original. 01/25/2016 with Dr. Loney Pol at [facility]. [place] of this note might be different from the original. 01/25/2016 with Dr. Loney Pol at [facility]. [place] of this note might be different from the original. Formatting of this note might be different from    Seronegative inflammatory arthritis 08/28/2014   Formatting of this note might be different from the original. Formatting of this note might be different from the original. Arthritis; diagnosed as IBD related - 2012 Formatting of this note might be different from the original. Followed by rheumatologist. Had to change providers due to an insurance change earlier this year resulting in patient not being able to have Remicade  for ~6 months. 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 Formatting of this note might be different from the original. Treated with PRN miralax . Recent colonoscopy 07/2019. Formatting of this note might be different from the original. Treated with PRN miralax . Recent colonoscopy 07/2019. Formatting of this note might be different from the original. Formatting of this note might be different f   Status post placement of ureteral stent 02/06/2021   Tooth infection 08/12/2020   Unstable angina (HCC) 07/29/2023   Ureteral stone 01/20/2022   Urinary tract infection, site not specified 06/26/2022   BP 112/79 (BP Location: Left Arm, Patient Position: Sitting, Cuff Size: Normal)   Pulse 95   Ht 5' 3 (1.6 m)   Wt 208 lb (94.3 kg)   SpO2 96%   BMI 36.85 kg/m   Opioid Risk Score:   Fall Risk Score:  `1  Depression screen [facility] 2/9     05/08/2024   10:51 AM 12/14/2023   10:50 AM 09/12/2023   10:30 AM  09/05/2023    8:57 AM 09/02/2023    9:01 AM 08/30/2023   12:51 PM 07/21/2023   10:50 AM  Depression screen PHQ 2/9  Decreased Interest 0 0 0 0 0 0 0  Down, Depressed, Hopeless 0 0 0 0 0 0 0  PHQ - 2 Score 0 0 0 0 0  0 0  Altered sleeping 3      3  Tired, decreased energy 3      3  Change in appetite 0      0  Feeling bad or failure about yourself  0      0  Trouble concentrating 0      0  Moving slowly or fidgety/restless 0      0  Suicidal thoughts 0      0  PHQ-9 Score 6      6  Difficult doing work/chores Very difficult            Review of Systems  Constitutional:  Positive for unexpected weight change.       Skin rash  Respiratory:  Positive for shortness of breath.   Cardiovascular:  Positive for leg swelling.  Gastrointestinal:  Positive for constipation, diarrhea and nausea.       See's GI  Musculoskeletal:  Positive for arthralgias, back pain and myalgias.       Patient reports having rheumatoid arthritis Left shoulder pain radiates down arm, bilateral arm, leg pain, upper and lower back pain  Hematological:  Bruises/bleeds easily.  All other systems reviewed and are negative.      Objective:   Physical Exam Vitals and nursing note reviewed.  Constitutional:      Appearance: She is obese.  HENT:     Head: Normocephalic and atraumatic.     Mouth/Throat:     Mouth: Mucous membranes are moist.  Eyes:     General: No visual field deficit or scleral icterus.    Conjunctiva/sclera: Conjunctivae normal.     Pupils: Pupils are equal, round, and reactive to light.  Neck:     Comments: Upper trapezius tenderness mild Musculoskeletal:     Cervical back: Normal range of motion. Tenderness present.  Neurological:     Mental Status: She is alert and oriented to person, place, and time.     Cranial Nerves: No dysarthria or facial asymmetry.     Sensory: Sensation is intact.     Motor: No tremor, abnormal muscle tone or seizure activity.     Coordination: Coordination  is intact.     Gait: Gait is intact.     Comments: Motor strength is 5/5 bilateral deltoid bicep tricep grip hip flexor knee extensor ankle dorsiflexor  Psychiatric:        Mood and Affect: Mood normal.    Joint exam no evidence of joint deformities in the fingers wrist elbows No evidence of joint effusion in the elbows wrists fingers knees or ankles. No pain with hip internal and external rotation.  No pain with knee range of motion or ankle range of motion. There is tenderness along the lumbar spine lower lumbar region pain with endrange flexion and extension.      Assessment & Plan:  #1.  Sero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 arthritis versus psoriatic arthritis still getting workup at [facility].  She has widespread body pain has been diagnosed with fibromyalgia in the past I do not detect any widespread muscular type soft tissue pain at this time.  She does have some trapezius discomfort. Patient has fairly diffuse degenerative changes such as AC joint some rotator cuff tendinopathy but no surgical lesions. She gives a history of being able to manage her pain during the day without medication management however at night she is unable to sleep due to pain. We discussed that she may be an appropriate candidate for narcotic analgesic  management but will need to get UDS first.  She should not have any narcotic analgesics given her report of greater than 1 week without her oxycodone . If she does have UDS positive for any type of narcotic analgesics or illicit drugs she would not be a candidate for a chronic analgesic management on a chronic basis. If UDS is consistent consider below She has multiple drug allergies.  This complicates treatment options Xtampza  9mg  or MS Contin  15mg  nightly versus oxycodone  IR 5 mg nightly.  The patient is independent with all self-care and is even able to work from home she does have some physical limitations and widespread body pain that should respond  to regular exercise.  She does not exercise on regular basis.  The walking or aquatic exercise may be good options or bicycling.  Alternatively something like tai chi may also be helpful.  She does use.  Topical steroids as well as Tylenol  PM at night.  She uses heat and ice.  Addendum 05/11/2024: Urine drug screen reviewed oxymorphone which is metabolite of oxycodone  as expected given last dose approximately 1 week prior to collection.  No evidence of hydrocodone .  No other illicit drugs noted. Will write prescription for oxycodone  5 mg nightly.  She will follow-up with nurse practitioner 1 month.  If this dose is not sufficient consider increasing to 10 mg versus prescribing a long-acting medication such as MS Contin  15 mg nightly or Xtampza  9 mg nightly.

## 2024-05-08 NOTE — Patient Instructions (Signed)
 It was nice to see you today!  If you have any problems before your next visit feel free to message me via MyChart (minor issues or questions) or call the office, otherwise you may reach out to schedule an office visit.  Thank you! Pau Banh, PA-C

## 2024-05-08 NOTE — Patient Instructions (Signed)
 Will review

## 2024-05-09 ENCOUNTER — Ambulatory Visit (HOSPITAL_BASED_OUTPATIENT_CLINIC_OR_DEPARTMENT_OTHER): Payer: Self-pay | Admitting: Student

## 2024-05-09 LAB — HEMOGLOBIN A1C
Est. average glucose Bld gHb Est-mCnc: 128 mg/dL
Hgb A1c MFr Bld: 6.1 % — ABNORMAL HIGH (ref 4.8–5.6)

## 2024-05-10 ENCOUNTER — Ambulatory Visit (INDEPENDENT_AMBULATORY_CARE_PROVIDER_SITE_OTHER)

## 2024-05-10 DIAGNOSIS — J309 Allergic rhinitis, unspecified: Secondary | ICD-10-CM | POA: Diagnosis not present

## 2024-05-11 ENCOUNTER — Telehealth: Payer: Self-pay | Admitting: Physical Medicine & Rehabilitation

## 2024-05-11 LAB — TOXASSURE SELECT,+ANTIDEPR,UR

## 2024-05-11 MED ORDER — OXYCODONE HCL 5 MG PO TABS: 5.0000 mg | ORAL_TABLET | Freq: Every day | ORAL | 0 refills | Status: DC

## 2024-05-11 NOTE — Telephone Encounter (Signed)
 Patient calling about results of UDS.  Dr. Carilyn told patient to call back on Friday 05/11/24.

## 2024-05-11 NOTE — Addendum Note (Signed)
 Addended by: CARILYN PRENTICE BRAVO on: 05/11/2024 03:29 PM   Modules accepted: Orders

## 2024-05-15 ENCOUNTER — Ambulatory Visit (HOSPITAL_BASED_OUTPATIENT_CLINIC_OR_DEPARTMENT_OTHER): Admitting: Student

## 2024-05-15 ENCOUNTER — Telehealth: Payer: Self-pay

## 2024-05-15 NOTE — Telephone Encounter (Signed)
   Pre-operative Risk Assessment    Patient Name: Casey [name]  DOB: October 30, 1970 MRN: [id number]   Date of last office visit: 05/03/24 MADONNA LARGE, DO Date of next office visit: NONE   Request for Surgical Clearance    Procedure:  ENDOSCOPY AND / OR COLONOSCOPY  Date of Surgery:  Clearance TBD                                Surgeon:  DR BERTA Socks Group or Practice Name:  [facility] Phone number:  [phone] Fax number:  [phone]   Type of Clearance Requested:   - Medical  - Pharmacy:  Hold Ticagrelor  (Brilinta ) 4 DAYS PRIOR   Type of Anesthesia:  Not Indicated   Additional requests/questions:    Signed, Lucie DELENA Ku   05/15/2024, 5:41 PM

## 2024-05-16 NOTE — Telephone Encounter (Signed)
 Dr. Michele,  You saw this patient on 05/03/2024. Per protocol we request that you comment on his cardiac risk to proceed with colonoscopy and EDG since it has been less than 2 months since evaluated in the office. Please send your comment to P CV Pre-Op Pool.  Thank you, Lamarr Satterfield DNP, ANP, AACC.

## 2024-05-17 NOTE — Telephone Encounter (Signed)
 Called her to review her symptoms and the call went to voicemail. Will try again.   Glendell Fouse [place], DO, FACC

## 2024-05-17 NOTE — Telephone Encounter (Signed)
 Patient returned Pre-op call and stated can leave voice message.

## 2024-05-21 ENCOUNTER — Telehealth (HOSPITAL_BASED_OUTPATIENT_CLINIC_OR_DEPARTMENT_OTHER): Payer: Self-pay

## 2024-05-21 NOTE — Telephone Encounter (Signed)
 Pt informed the disability form for [employer] is completed. We will fax and give pt a copy for pick up.

## 2024-05-22 ENCOUNTER — Telehealth: Payer: Self-pay

## 2024-05-22 DIAGNOSIS — G4734 Idiopathic sleep related nonobstructive alveolar hypoventilation: Secondary | ICD-10-CM

## 2024-05-22 NOTE — Telephone Encounter (Signed)
 Copied from CRM [id number]. Topic: Clinical - Medical Advice >> May 22, 2024 11:23 AM Devaughn RAMAN wrote: Reason for CRM: Patient called stating she doesn't think the pulse oxymetry actuarely recorded as she thinks it accidentally turned it off used Friday-Sunday night and she called to inquire if a sleep study is needed for accurate info. Patient would like a callback regarding this.  I called and spoke to pt. Pt states that this may have only collected 4 hours at a time as she thinks it may not have been on the whole time. Pt states she knows she was supposed to collect more data than what she did and would like to know if she needs to redo the test or do an in lab test instead. Please advise.

## 2024-05-22 NOTE — Telephone Encounter (Signed)
 It only recorded about 3 hours of data.  I think we will need to repeat this.  There is no desaturation but I do not think there is enough data.  Please order repeat over oximetry test to be performed on room air.  I do not think we need a sleep study, there is a sleep study and there was no evidence of sleep apnea just low oxygen.

## 2024-05-23 NOTE — Telephone Encounter (Signed)
 Spoke to the patient over the phone. Since her last office visit 05/03/2024 patient denies anginal chest pain. She would like to continue with medical therapy and evaluate for noncardiac causes. She does not have symptoms of anginal discomfort, like she did prior to her heart catheterization in November 2024. Patient is scheduled for EGD June 19, 2024 to evaluate for candidiasis, per patient Her recent nuclear stress test noted normal perfusion and it was a low risk study 05/02/2024. Shared decision was to proceed forward with EGD to evaluate for noncardiac causes of chest pain. Advised her to hold Brilinta  4 days prior to the procedure as requested and restart once cleared by GI provider postprocedure.  Dr. Margie Urbanowicz

## 2024-05-23 NOTE — Telephone Encounter (Signed)
 Called the pt and there was no answer- LMTCB I have ordered repeat ONO. I have sent the pt mychart msg to inform her and will close out this msg

## 2024-05-23 NOTE — Telephone Encounter (Signed)
   Patient Name: Casey [name]  DOB: 03/11/1971 MRN: [id number]  Primary Cardiologist: Madonna Large, DO  Chart reviewed as part of pre-operative protocol coverage. Given past medical history and time since last visit, based on ACC/AHA guidelines, [name] is at acceptable risk for the planned procedure without further cardiovascular testing.   Per Dr. Large 05/23/2024 Spoke to the patient over the phone. Since her last office visit 05/03/2024 patient denies anginal chest pain. She would like to continue with medical therapy and evaluate for noncardiac causes. She does not have symptoms of anginal discomfort, like she did prior to her heart catheterization in November 2024. Patient is scheduled for EGD June 19, 2024 to evaluate for candidiasis, per patient Her recent nuclear stress test noted normal perfusion and it was a low risk study 05/02/2024. Shared decision was to proceed forward with EGD to evaluate for noncardiac causes of chest pain. Advised her to hold Brilinta  4 days prior to the procedure as requested and restart once cleared by GI provider postprocedure  The patient was advised that if she develops new symptoms prior to surgery to contact our office to arrange for a follow-up visit, and she verbalized understanding.  I will route this recommendation to the requesting party via Epic fax function and remove from pre-op pool.  Please call with questions.  Lamarr Satterfield, NP 05/23/2024, 1:53 PM

## 2024-05-24 ENCOUNTER — Encounter (HOSPITAL_BASED_OUTPATIENT_CLINIC_OR_DEPARTMENT_OTHER): Payer: Self-pay

## 2024-05-24 ENCOUNTER — Ambulatory Visit (HOSPITAL_BASED_OUTPATIENT_CLINIC_OR_DEPARTMENT_OTHER)
Admission: RE | Admit: 2024-05-24 | Discharge: 2024-05-24 | Disposition: A | Discharge status: Home / Self Care | Attending: Family Medicine | Admitting: Family Medicine

## 2024-05-24 VITALS — BP 115/80 | HR 82 | Temp 98.3°F | Resp 16

## 2024-05-24 DIAGNOSIS — M25571 Pain in right ankle and joints of right foot: Secondary | ICD-10-CM

## 2024-05-24 DIAGNOSIS — M25572 Pain in left ankle and joints of left foot: Secondary | ICD-10-CM

## 2024-05-24 MED ORDER — TRIAMCINOLONE ACETONIDE 40 MG/ML IJ SUSP
40.0000 mg | Freq: Once | INTRAMUSCULAR | Status: AC
  Administered 2024-05-24: 40 mg via INTRAMUSCULAR

## 2024-05-24 NOTE — Discharge Instructions (Addendum)
 Follow up with Rheumatology as planned.

## 2024-05-24 NOTE — ED Triage Notes (Signed)
 Patient presents to [facility] for bilateral ankle pain x 2 days. Treating with oxy yesterday and tylenol . Last dose of tylenol  4 hrs ago. She states pain worse today, worse with ambulation.

## 2024-05-24 NOTE — ED Notes (Signed)
 Patient declined cam boot offered in the clinic, states the upper part of boot is uncomfortable due to size of her calf. Provider notified.

## 2024-05-25 ENCOUNTER — Telehealth: Payer: Self-pay | Admitting: *Deleted

## 2024-05-25 NOTE — Telephone Encounter (Signed)
 Copied from CRM [id number]. Topic: Clinical - Medical Advice >> May 23, 2024  2:02 PM Devaughn RAMAN wrote: Reason for CRM: Patient returning [place] phone call. Patient stated she would Not like to go through [employer] and she would like to use the same company from before, she believes they will run into the same issue as before due to how she sleeps. Patient would like to know if there is another way this test can be performed. Patient would like a callback regarding this.  I called and spoke with the pt  She is not wanting to do ONO again  She says last ONO did not record enough time and she does not see the point in repeating same test  She asked if another test like a sleep study would be better for her and if so can we order that instead Please advise, thanks!

## 2024-05-28 NOTE — Telephone Encounter (Signed)
 Called; Patient said she'll call me back in half an hour.

## 2024-05-28 NOTE — ED Provider Notes (Signed)
 [facility]    CSN: [id number] Arrival date & time: 05/24/24  1810      History   Chief Complaint Chief Complaint  Patient presents with   Ankle Pain    Not sure if related to arthritis . Have reached - Entered by patient    HPI Casey [name] is a 53 y.o. female.   Patient is a 53 year old female that  presents to [facility] for bilateral ankle pain x 2 days. Treating with oxy yesterday and tylenol . Last dose of tylenol  4 hrs ago. She states pain worse today, worse with ambulation.  No injuries. Hx of RA    Ankle Pain   Past Medical History:  Diagnosis Date   Albuminuria 06/27/2015   Anxiety 11/04/2014   Arm DVT (deep venous thromboembolism), acute, left (HCC) 08/17/2022   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/30/2014   Asthma    Atopic dermatitis 10/30/2008   Formatting of this note might be different from the original. Atopic dermatitis Formatting of this note might be different from the original. Formatting of this note might be different from the original. Atopic dermatitis Formatting of this note might be different from the original. Formatting of this note might be different from the original. Formatting of this note might be different from the or   Cataract 05/06/2015   Contusion of left wrist 07/08/2021   Coronary artery disease s/p PCI/DES to LAD 07/01/2023 12/28/2022   Crohn's disease (HCC) 04/21/2015   De Quervain's tenosynovitis, left 07/31/2021   Diabetes mellitus without complication (HCC)    Diabetes type 2, controlled (HCC) 04/21/2015   Dry eyes 05/06/2015   DVT (deep venous thrombosis) (HCC)    Dysfunction of both eustachian tubes 08/31/2012   Encounter for monitoring immunomodulating therapy 11/20/2021   Environmental allergies 08/12/2020   Esophageal candidiasis (HCC) 06/26/2022   Essential hypertension 06/27/2015   Fibrositis 08/28/2014   Formatting of this note might be different from the original. Fibromyalgia Formatting of  this note might be different from the original. Formatting of this note might be different from the original. Fibromyalgia   Flank pain 11/24/2020   Gastroesophageal reflux disease with esophagitis without hemorrhage 08/01/2019   Formatting of this note might be different from the original. Formatting of this note might be different from the original. 07/2019: chronic symptoms of esophageal reflux since prior to sleeve gastrectomy surgery. Symptoms have worsened over the last 6 months with recent EGD findings of grade B esophagitis, irregular z line, erythematous mucosa, and evidence of sleeve gastrectomy. 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 Formatting of this note might be different from the original. 07/2019: 8 mm colonic polyp removed during colonoscopy, biopsy showed serrated polyp. Repeat colonoscopy recommended in one year. Formatting of this note might be different from the original. 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 Hypertension Formatting of this note might be different from the original. Formatting of this note might be different from  the original. 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 Keratoconus Formatting of this note might be different from the original.  Formatting of this note might be different from the original. Keratoconus Formatting of this note might be different from the original. Formatting of this note might be different from the original. Keratoconus Formatting of this note might be different from the or   Microhematuria 06/27/2015   Migraine 08/28/2014   Formatting of this note might be different from the original. Migraine Formatting of this note might be different from the original. Formatting of this note might be different from the original. Migraine Formatting of this note might be different from the original. Migraine Formatting of this note might be different from the original. Migraine Formatting of this note might be different from the or   Migraine without aura and without status migrainosus, not intractable 08/28/2014   Formatting of this note might be different from the original. Formatting of this note might be different from the original. Formatting of this note might be different from the original. Migraine Formatting of this note might be different from the original. Migraine Formatting of this note might be different from the original. Formatting of this note might be different from the original. Migraine F   Mild intermittent asthma without complication 10/30/2008   Formatting of this note might be different from the original. Formatting of this note might be different from the original. Formatting of this note might be different from the original. Asthma Formatting of this note might be different from the original. Asthma Formatting of this note might be different from the original. Formatting of this note might be different from the original. 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 Formatting of this note might be different from the original. 01/25/2016 with Dr. Loney Pol at [facility]. [place] of this note might be different from the original. 01/25/2016 with Dr. Loney Pol at [facility]. [place] of this note might be different from the original. Formatting of this note might be different from    Seronegative inflammatory arthritis 08/28/2014   Formatting of this note might be different from the original. Formatting of this note might be different from the original. Arthritis; diagnosed as IBD related - 2012 Formatting of this note might be different from the original. Followed by rheumatologist. Had to change providers due to an insurance change earlier this year resulting in patient not being able to have Remicade  for ~6 months. 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 Formatting of this note might be different from the original. Treated with PRN miralax . Recent colonoscopy 07/2019. Formatting  of this note might be different from the original. Treated with PRN miralax . Recent colonoscopy 07/2019. Formatting of this note might be different from the original. Formatting of this note might be different f   Status post placement of ureteral stent 02/06/2021   Tooth infection 08/12/2020   Unstable angina (HCC) 07/29/2023   Ureteral stone 01/20/2022   Urinary tract infection, site not specified 06/26/2022    Patient Active Problem  List   Diagnosis Date Noted   Dizziness 02/13/2024   Skin rash 02/13/2024   Other chronic pain 12/14/2023   Hypokalemia 09/21/2023   Bilateral lower extremity edema 09/16/2023   Unstable angina (HCC) 07/29/2023   Moderate persistent asthma 07/29/2023   History of DVT (deep vein thrombosis) 06/30/2023   Nocturnal hypoxemia 06/13/2023   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   Coronary artery disease s/p PCI/DES to LAD 07/01/2023 12/28/2022   Hyperlipidemia 10/07/2022   Hiatal hernia 10/07/2022   Esophageal candidiasis (HCC) 06/26/2022   Rheumatoid arthritis of multiple sites with 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 factor (HCC) 11/16/2021   De Quervain's tenosynovitis, left 07/31/2021   Status post placement of ureteral stent 02/06/2021   Proteinuria 08/12/2020   Environmental allergies 08/12/2020   History of kidney stones 08/12/2020   Gastroesophageal reflux disease with esophagitis without hemorrhage 08/01/2019   Slow transit constipation 08/01/2019   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   History of colon polyps 06/19/2019   Irregular astigmatism of both eyes 10/11/2018   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   Myopia with astigmatism and presbyopia 05/07/2016   Overactive bladder 11/18/2015   Incontinence 10/07/2015   Essential hypertension 06/27/2015   Cataract 05/06/2015   Dry eyes 05/06/2015   Crohn's disease (HCC) 04/21/2015   Osteoarthritis of both knees 04/21/2015   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/30/2014   History of abnormal cervical Pap smear 11/29/2014   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   Migraine without aura and without status migrainosus, not intractable 08/28/2014   Migraine 08/28/2014   Seronegative inflammatory arthritis 08/28/2014   Increased urinary frequency 07/16/2013   Dysfunction of both eustachian tubes 08/31/2012   Asthma 10/30/2008    Past Surgical History:  Procedure  Laterality Date   BACK SURGERY     L5-S1   CERVICAL CONE BIOPSY  04/2000   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   CORONARY STENT INTERVENTION N/A 07/01/2023   Procedure: CORONARY STENT INTERVENTION;  Surgeon: Darron Deatrice LABOR, MD;  Location: [facility];  Service: Cardiovascular;  Laterality: N/A;   EYE SURGERY     GASTRIC BYPASS  2017   Sleve   LAPAROSCOPIC GASTRIC SLEEVE RESECTION     LEFT HEART CATH AND CORONARY ANGIOGRAPHY N/A 07/01/2023   Procedure: LEFT HEART CATH AND CORONARY ANGIOGRAPHY;  Surgeon: Darron Deatrice LABOR, MD;  Location: [facility];  Service: Cardiovascular;  Laterality: N/A;   LEFT HEART CATH AND CORONARY ANGIOGRAPHY N/A 08/01/2023   Procedure: LEFT HEART CATH AND CORONARY ANGIOGRAPHY;  Surgeon: Ladona Heinz, MD;  Location: [facility];  Service: Cardiovascular;  Laterality: N/A;   Low back disc surgery     06/2000   TOTAL KNEE ARTHROPLASTY Right 11/2016    OB History     Gravida  0   Para  0   Term  0   Preterm  0   AB  0   Living  0      SAB  0   IAB  0  Ectopic  0   Multiple  0   Live Births  0            Home Medications    Prior to Admission medications   Medication Sig Start Date End Date Taking? Authorizing Provider  acetaminophen  (TYLENOL ) 500 MG tablet Take 1,000 mg by mouth every 12 (twelve) hours as needed for mild pain or moderate pain.    [provider]  albuterol  (PROVENTIL ) (2.5 MG/3ML) 0.083% nebulizer solution Take 3 mLs (2.5 mg total) by nebulization every 4 (four) hours as needed for wheezing or shortness of breath. 04/18/24   Hunsucker, Donnice SAUNDERS, MD  aMILoride (MIDAMOR) 5 MG tablet Take 10 mg by mouth daily. 03/18/24   [provider]  atorvastatin  (LIPITOR) 40 MG tablet Take 2 tablets (80 mg total) by mouth daily. 01/19/24   Tolia, Sunit, DO  B Complex-C (B-COMPLEX WITH VITAMIN C) tablet Take 1 tablet by mouth daily. 09/06/19   [provider]  Cholecalciferol  (VITAMIN D3)  50 MCG (2000 UT) TABS Take 1 tablet by mouth daily.    [provider]  Difluprednate 0.05 % EMUL Apply 1 drop to eye. 1 drop into left eye twice daily for 30 days Patient not taking: Reported on 05/08/2024 04/04/24   [provider]  EPINEPHrine  (EPIPEN  2-PAK) 0.3 mg/0.3 mL IJ SOAJ injection Inject 0.3 mg into the muscle as needed for anaphylaxis. 04/24/24   Cari Arlean HERO, FNP  Evolocumab  (REPATHA  SURECLICK) 140 MG/ML SOAJ Inject 140 mg into the skin every 14 (fourteen) days. 12/27/23   Tolia, Sunit, DO  famotidine  (PEPCID ) 40 MG tablet Take 40 mg by mouth 2 (two) times daily. 12/20/23 12/19/24  [provider]  levocetirizine (XYZAL) 5 MG tablet Take 10 mg by mouth daily.    [provider]  losartan (COZAAR) 50 MG tablet Take 50 mg by mouth daily.    [provider]  lubiprostone (AMITIZA) 8 MCG capsule Take 8 mcg by mouth 2 (two) times daily. Patient taking differently: Take 8 mcg by mouth daily.    [provider]  mirabegron  ER (MYRBETRIQ ) 50 MG TB24 tablet Take 1 tablet (50 mg total) by mouth daily. 11/30/22   Jason Leita Repine, FNP  montelukast  (SINGULAIR ) 10 MG tablet Take 1 tablet (10 mg total) by mouth daily. 04/24/24   Cari Arlean HERO, FNP  nitroGLYCERIN  (NITROSTAT ) 0.4 MG SL tablet Place 1 tablet (0.4 mg total) under the tongue every 5 (five) minutes as needed for chest pain. 12/13/23   Tolia, Sunit, DO  oxyCODONE  (OXY IR/ROXICODONE ) 5 MG immediate release tablet Take 1 tablet (5 mg total) by mouth at bedtime. 05/11/24   Kirsteins, Prentice BRAVO, MD  RABEprazole (ACIPHEX) 20 MG tablet Take 20 mg by mouth 2 (two) times daily.    [provider]  Secukinumab (COSENTYX SENSOREADY PEN) 150 MG/ML SOAJ Inject 150 mg into the skin. 03/01/24   [provider]  ticagrelor  (BRILINTA ) 90 MG TABS tablet Take 1 tablet (90 mg total) by mouth 2 (two) times daily. 01/12/24   Tolia, Sunit, DO  triamcinolone  cream (KENALOG ) 0.1 % Apply 1 Application  topically 2 (two) times daily. 04/09/24   Vivienne Delon HERO, PA-C    Family History Family History  Adopted: Yes  Problem Relation Age of Onset   Diabetes Mother    Hypertension Mother    Diabetes Father    Hypertension Father    Hypertension Sister    Diabetes Sister    Hypertension Brother  Diabetes Brother    Diabetes Paternal Grandmother    Cancer Other    Asthma Neg Hx    Allergic rhinitis Neg Hx    Atopy Neg Hx    Eczema Neg Hx     Social History Social History   Tobacco Use   Smoking status: Never    Passive exposure: Never   Smokeless tobacco: Never  Vaping Use   Vaping status: Never Used  Substance Use Topics   Alcohol use: Not Currently    Comment: rare   Drug use: Never     Allergies   Albuterol  sulfate, Corticosteroids, Silicone, Tape, Wound dressings, Formoterol, Hydrocodone , Hydrocodone -acetaminophen , Nickel, Other, Oxycodone -acetaminophen , Salmeterol, Strawberry extract, Advair hfa [fluticasone-salmeterol], Proventil  hfa [albuterol ], Asa [aspirin ], Cleocin [clindamycin], Clindamycin/lincomycin, Fludrocortisone acetate, Gabapentin, Hydrochlorothiazide, Imipramine, Ipratropium bromide, Medroxyprogesterone, Meloxicam, Metformin and related, Nystatin, Penicillins, Pred forte [prednisolone], Prednisone, Solu-medrol  [methylprednisolone ], Sulfa antibiotics, Tegaderm ag mesh [silver], Toradol  [ketorolac  tromethamine ], and Tramadol   Review of Systems Review of Systems See HPI  Physical Exam Triage Vital Signs ED Triage Vitals  Encounter Vitals Group     BP 05/24/24 1822 115/80     Girls Systolic BP Percentile --      Girls Diastolic BP Percentile --      Boys Systolic BP Percentile --      Boys Diastolic BP Percentile --      Pulse Rate 05/24/24 1822 82     Resp 05/24/24 1822 16     Temp 05/24/24 1822 98.3 F (36.8 C)     Temp Source 05/24/24 1822 Oral     SpO2 05/24/24 1822 97 %     Weight --      Height --      Head Circumference --       Peak Flow --      Pain Score 05/24/24 1821 8     Pain Loc --      Pain Education --      Exclude from Growth Chart --    No data found.  Updated Vital Signs BP 115/80 (BP Location: Right Arm)   Pulse 82   Temp 98.3 F (36.8 C) (Oral)   Resp 16   SpO2 97%   Visual Acuity Right Eye Distance:   Left Eye Distance:   Bilateral Distance:    Right Eye Near:   Left Eye Near:    Bilateral Near:     Physical Exam Vitals and nursing note reviewed.  Constitutional:      General: She is not in acute distress.    Appearance: Normal appearance. She is not ill-appearing, toxic-appearing or diaphoretic.  Pulmonary:     Effort: Pulmonary effort is normal.  Musculoskeletal:        General: Tenderness present. No swelling. Normal range of motion.  Neurological:     Mental Status: She is alert.  Psychiatric:        Mood and Affect: Mood normal.      UC Treatments / Results  Labs (all labs ordered are listed, but only abnormal results are displayed) Labs Reviewed - No data to display  EKG   Radiology No results found.  Procedures Procedures (including critical care time)  Medications Ordered in UC Medications  triamcinolone  acetonide (KENALOG -40) injection 40 mg (40 mg Intramuscular Given 05/24/24 1855)    Initial Impression / Assessment and Plan / UC Course  I have reviewed the triage vital signs and the nursing notes.  Pertinent labs & imaging results that were available during my  care of the patient were reviewed by me and considered in my medical decision making (see chart for details).     Acute bilateral ankle pain- I believe this is an RA flare. She came in requesting device to help her walk. We tried cam walker but the fitting wasn't right. She was provided a steroid injection for pain and inflammation. Recommended tylenol . Has appointment with Rheumatology coming up.  Final Clinical Impressions(s) / UC Diagnoses   Final diagnoses:  Acute left ankle pain   Acute right ankle pain     Discharge Instructions       Follow up with Rheumatology as planned.     ED Prescriptions   None    PDMP not reviewed this encounter.   Adah Wilbert LABOR, FNP 05/28/24 [phone]

## 2024-05-28 NOTE — Telephone Encounter (Signed)
 2nd request received.  Will route to the preop pool for the preop app to review

## 2024-05-28 NOTE — Telephone Encounter (Signed)
 She did a sleep test 04/2023 that showed no sleep apnea but desaturation to 87% very mild at night. That is why we ordered the overnight oximetry and not a repeat sleep test. The overnight oximetry is the test of choice - the amount if time recorded should be able to be improved. If she is insistent we can order a home sleep study but if the data is not available on that she very well may need to do an overnight oximetry in addition.

## 2024-05-30 ENCOUNTER — Telehealth: Payer: Self-pay

## 2024-05-30 NOTE — Telephone Encounter (Signed)
 Spoke w/ PT she a agrees to do both as long as it is in Lab.     Sent to [facility] to advise on order that needs to be placed

## 2024-05-30 NOTE — Telephone Encounter (Signed)
 Copied from CRM [id number]. Topic: Clinical - Medical Advice >> May 28, 2024 12:12 PM Casey [name] wrote: Reason for CRM: Patient returning phone call from [facility] - advised by CAL to send CRM for callback.  [phone]   Spoke w/ PT she agrees to do both Sleep study and ONO as long as it is in lab

## 2024-06-03 ENCOUNTER — Encounter (HOSPITAL_BASED_OUTPATIENT_CLINIC_OR_DEPARTMENT_OTHER): Payer: Self-pay | Admitting: Student

## 2024-06-04 ENCOUNTER — Other Ambulatory Visit (HOSPITAL_BASED_OUTPATIENT_CLINIC_OR_DEPARTMENT_OTHER)

## 2024-06-04 ENCOUNTER — Other Ambulatory Visit (HOSPITAL_BASED_OUTPATIENT_CLINIC_OR_DEPARTMENT_OTHER): Payer: Self-pay | Admitting: Student

## 2024-06-04 DIAGNOSIS — D649 Anemia, unspecified: Secondary | ICD-10-CM

## 2024-06-05 ENCOUNTER — Ambulatory Visit (HOSPITAL_BASED_OUTPATIENT_CLINIC_OR_DEPARTMENT_OTHER): Payer: Self-pay | Admitting: Student

## 2024-06-05 LAB — CBC WITH DIFFERENTIAL/PLATELET
Basophils Absolute: 0 x10E3/uL (ref 0.0–0.2)
Basos: 0 %
EOS (ABSOLUTE): 0.1 x10E3/uL (ref 0.0–0.4)
Eos: 1 %
Hematocrit: 39 % (ref 34.0–46.6)
Hemoglobin: 11.6 g/dL (ref 11.1–15.9)
Immature Grans (Abs): 0 x10E3/uL (ref 0.0–0.1)
Immature Granulocytes: 0 %
Lymphocytes Absolute: 3 x10E3/uL (ref 0.7–3.1)
Lymphs: 24 %
MCH: 23 pg — ABNORMAL LOW (ref 26.6–33.0)
MCHC: 29.7 g/dL — ABNORMAL LOW (ref 31.5–35.7)
MCV: 77 fL — ABNORMAL LOW (ref 79–97)
Monocytes Absolute: 0.8 x10E3/uL (ref 0.1–0.9)
Monocytes: 7 %
Neutrophils Absolute: 8.6 x10E3/uL — ABNORMAL HIGH (ref 1.4–7.0)
Neutrophils: 68 %
Platelets: 506 x10E3/uL — ABNORMAL HIGH (ref 150–450)
RBC: 5.04 x10E6/uL (ref 3.77–5.28)
RDW: 16.7 % — ABNORMAL HIGH (ref 11.7–15.4)
WBC: 12.6 x10E3/uL — ABNORMAL HIGH (ref 3.4–10.8)

## 2024-06-05 LAB — IRON,TIBC AND FERRITIN PANEL
Ferritin: 7 ng/mL — ABNORMAL LOW (ref 15–150)
Iron Saturation: 6 % — CL (ref 15–55)
Iron: 25 ug/dL — ABNORMAL LOW (ref 27–159)
Total Iron Binding Capacity: 418 ug/dL (ref 250–450)
UIBC: 393 ug/dL (ref 131–425)

## 2024-06-05 LAB — B12 AND FOLATE PANEL
Folate: 18.3 ng/mL (ref >3.0)
Vitamin B-12: 417 pg/mL (ref 232–1245)

## 2024-06-05 LAB — RETICULOCYTES: Retic Ct Pct: 1.7 % (ref 0.6–2.6)

## 2024-06-06 ENCOUNTER — Ambulatory Visit (INDEPENDENT_AMBULATORY_CARE_PROVIDER_SITE_OTHER)

## 2024-06-06 ENCOUNTER — Telehealth (HOSPITAL_BASED_OUTPATIENT_CLINIC_OR_DEPARTMENT_OTHER): Payer: Self-pay

## 2024-06-06 ENCOUNTER — Other Ambulatory Visit (HOSPITAL_BASED_OUTPATIENT_CLINIC_OR_DEPARTMENT_OTHER): Payer: Self-pay | Admitting: Student

## 2024-06-06 DIAGNOSIS — J309 Allergic rhinitis, unspecified: Secondary | ICD-10-CM

## 2024-06-06 DIAGNOSIS — D509 Iron deficiency anemia, unspecified: Secondary | ICD-10-CM

## 2024-06-06 NOTE — Telephone Encounter (Signed)
 Copied from CRM [id number]. Topic: Referral - Question >> Jun 06, 2024  1:14 PM Rachelle R wrote: Reason for CRM: Relayed Volanda Mangine's message to patient as she was unable to view on MyChart. States yes, she would like a referral sent for the iron infusion.  Patient can be reached at [phone] for additional information.

## 2024-06-08 ENCOUNTER — Other Ambulatory Visit: Payer: Self-pay

## 2024-06-08 ENCOUNTER — Encounter: Payer: Self-pay | Admitting: Hematology and Oncology

## 2024-06-08 ENCOUNTER — Inpatient Hospital Stay

## 2024-06-08 ENCOUNTER — Inpatient Hospital Stay: Attending: Hematology and Oncology | Admitting: Hematology and Oncology

## 2024-06-08 ENCOUNTER — Other Ambulatory Visit: Payer: Self-pay | Admitting: Hematology and Oncology

## 2024-06-08 ENCOUNTER — Telehealth: Payer: Self-pay | Admitting: Hematology and Oncology

## 2024-06-08 VITALS — BP 128/79 | HR 73 | Temp 98.0°F | Resp 18 | Ht 63.0 in | Wt 212.2 lb

## 2024-06-08 DIAGNOSIS — D649 Anemia, unspecified: Secondary | ICD-10-CM

## 2024-06-08 DIAGNOSIS — D509 Iron deficiency anemia, unspecified: Secondary | ICD-10-CM | POA: Insufficient documentation

## 2024-06-08 LAB — FOLATE: Folate: 19.9 ng/mL (ref >5.9)

## 2024-06-08 LAB — CMP (CANCER CENTER ONLY)
ALT: 14 U/L (ref 0–44)
AST: 17 U/L (ref 15–41)
Albumin: 4.1 g/dL (ref 3.5–5.0)
Alkaline Phosphatase: 110 U/L (ref 38–126)
Anion gap: 11 (ref 5–15)
BUN: 14 mg/dL (ref 6–20)
CO2: 24 mmol/L (ref 22–32)
Calcium: 9.6 mg/dL (ref 8.9–10.3)
Chloride: 104 mmol/L (ref 98–111)
Creatinine: 0.91 mg/dL (ref 0.44–1.00)
GFR, Estimated: >60 mL/min (ref >60)
Glucose, Bld: 96 mg/dL (ref 70–99)
Potassium: 3.9 mmol/L (ref 3.5–5.1)
Sodium: 139 mmol/L (ref 135–145)
Total Bilirubin: 0.4 mg/dL (ref 0.0–1.2)
Total Protein: 8 g/dL (ref 6.5–8.1)

## 2024-06-08 LAB — IRON AND TIBC
Iron: 23 ug/dL — ABNORMAL LOW (ref 28–170)
Saturation Ratios: 5 % — ABNORMAL LOW (ref 10.4–31.8)
TIBC: 462 ug/dL — ABNORMAL HIGH (ref 250–450)
UIBC: 440 ug/dL

## 2024-06-08 LAB — CBC WITH DIFFERENTIAL (CANCER CENTER ONLY)
Abs Immature Granulocytes: 0.05 K/uL (ref 0.00–0.07)
Basophils Absolute: 0.1 K/uL (ref 0.0–0.1)
Basophils Relative: 0 %
Eosinophils Absolute: 0.2 K/uL (ref 0.0–0.5)
Eosinophils Relative: 1 %
HCT: 36.2 % (ref 36.0–46.0)
Hemoglobin: 11.1 g/dL — ABNORMAL LOW (ref 12.0–15.0)
Immature Granulocytes: 0 %
Lymphocytes Relative: 30 %
Lymphs Abs: 3.8 K/uL (ref 0.7–4.0)
MCH: 23.6 pg — ABNORMAL LOW (ref 26.0–34.0)
MCHC: 30.7 g/dL (ref 30.0–36.0)
MCV: 76.9 fL — ABNORMAL LOW (ref 80.0–100.0)
Monocytes Absolute: 0.9 K/uL (ref 0.1–1.0)
Monocytes Relative: 7 %
Neutro Abs: 7.8 K/uL — ABNORMAL HIGH (ref 1.7–7.7)
Neutrophils Relative %: 62 %
Platelet Count: 456 K/uL — ABNORMAL HIGH (ref 150–400)
RBC: 4.71 MIL/uL (ref 3.87–5.11)
RDW: 17.5 % — ABNORMAL HIGH (ref 11.5–15.5)
WBC Count: 12.8 K/uL — ABNORMAL HIGH (ref 4.0–10.5)
nRBC: 0 % (ref 0.0–0.2)

## 2024-06-08 LAB — VITAMIN B12: Vitamin B-12: 332 pg/mL (ref 180–914)

## 2024-06-08 LAB — TSH: TSH: 1.88 u[IU]/mL (ref 0.350–4.500)

## 2024-06-08 NOTE — Telephone Encounter (Signed)
 Patient has been scheduled for follow-up visit per 06/08/24 LOS.  Pt noted appt details on personal planner/calendar.

## 2024-06-10 ENCOUNTER — Telehealth (HOSPITAL_BASED_OUTPATIENT_CLINIC_OR_DEPARTMENT_OTHER): Payer: Self-pay | Admitting: Student

## 2024-06-10 NOTE — Telephone Encounter (Signed)
 Copied from CRM [id number]. Topic: General - Other >> Jun 08, 2024  4:44 PM Joesph NOVAK wrote: Reason for CRM: Odis from  [employer] is calling to see if the office received a medical request for long term disability. Faxed 06/04/2024.   FAX: [phone].

## 2024-06-11 ENCOUNTER — Encounter: Payer: Self-pay | Admitting: Radiology

## 2024-06-11 ENCOUNTER — Telehealth: Payer: Self-pay | Admitting: Pulmonary Disease

## 2024-06-11 NOTE — Telephone Encounter (Signed)
 Copied from CRM [id number]. Topic: Medical Record Request - Records Request >> Jun 08, 2024  4:24 PM Devaughn RAMAN wrote: Reason for CRM: Odis with the [employer] called to inquire if the Long Term Disability Claim forms had been filled out for the pt. Please f/u regarding this at  [facility] [phone] ext: [phone] Fax-[phone]    Have not received anything from this patient yet. Not sure if you all have received anything.

## 2024-06-12 ENCOUNTER — Ambulatory Visit (INDEPENDENT_AMBULATORY_CARE_PROVIDER_SITE_OTHER)

## 2024-06-12 ENCOUNTER — Inpatient Hospital Stay: Attending: Hematology and Oncology

## 2024-06-12 ENCOUNTER — Telehealth (HOSPITAL_BASED_OUTPATIENT_CLINIC_OR_DEPARTMENT_OTHER): Payer: Self-pay | Admitting: Student

## 2024-06-12 ENCOUNTER — Other Ambulatory Visit: Payer: Self-pay | Admitting: Pharmacist

## 2024-06-12 VITALS — BP 115/62 | HR 84 | Temp 98.1°F | Resp 20

## 2024-06-12 DIAGNOSIS — J309 Allergic rhinitis, unspecified: Secondary | ICD-10-CM | POA: Diagnosis not present

## 2024-06-12 DIAGNOSIS — D649 Anemia, unspecified: Secondary | ICD-10-CM | POA: Insufficient documentation

## 2024-06-12 DIAGNOSIS — D509 Iron deficiency anemia, unspecified: Secondary | ICD-10-CM

## 2024-06-12 MED ORDER — IRON SUCROSE 20 MG/ML IV SOLN
200.0000 mg | Freq: Once | INTRAVENOUS | Status: AC
  Administered 2024-06-12: 200 mg via INTRAVENOUS
  Filled 2024-06-12: qty 10

## 2024-06-12 MED ORDER — ACETAMINOPHEN 325 MG PO TABS
650.0000 mg | ORAL_TABLET | Freq: Once | ORAL | Status: AC
  Administered 2024-06-12: 650 mg via ORAL
  Filled 2024-06-12: qty 2

## 2024-06-12 MED ORDER — SODIUM CHLORIDE 0.9 % IV SOLN: INTRAVENOUS | Status: DC

## 2024-06-12 MED ORDER — FAMOTIDINE IN NACL 20-0.9 MG/50ML-% IV SOLN
20.0000 mg | Freq: Once | INTRAVENOUS | Status: AC
  Administered 2024-06-12: 20 mg via INTRAVENOUS
  Filled 2024-06-12: qty 50

## 2024-06-12 NOTE — Telephone Encounter (Signed)
 Patient dropped off document Disability Forms, to be filled out by provider. Patient requested to send it back via Call Patient to pick up within 5-days. Document is located in providers tray at front office.Please advise at [facility] [phone]

## 2024-06-12 NOTE — Patient Instructions (Signed)

## 2024-06-13 ENCOUNTER — Inpatient Hospital Stay

## 2024-06-13 ENCOUNTER — Ambulatory Visit: Admitting: Registered Nurse

## 2024-06-13 ENCOUNTER — Telehealth: Payer: Self-pay | Admitting: Pulmonary Disease

## 2024-06-13 VITALS — BP 123/70 | HR 90 | Temp 98.0°F | Resp 20

## 2024-06-13 DIAGNOSIS — D649 Anemia, unspecified: Secondary | ICD-10-CM | POA: Diagnosis not present

## 2024-06-13 DIAGNOSIS — G4734 Idiopathic sleep related nonobstructive alveolar hypoventilation: Secondary | ICD-10-CM

## 2024-06-13 DIAGNOSIS — D509 Iron deficiency anemia, unspecified: Secondary | ICD-10-CM

## 2024-06-13 MED ORDER — IRON SUCROSE 20 MG/ML IV SOLN
200.0000 mg | Freq: Once | INTRAVENOUS | Status: AC
  Administered 2024-06-13: 200 mg via INTRAVENOUS
  Filled 2024-06-13: qty 10

## 2024-06-13 NOTE — Telephone Encounter (Signed)
 Please post payment to account.

## 2024-06-13 NOTE — Telephone Encounter (Signed)
 Dr Annella, are you okay with an in lab study?

## 2024-06-13 NOTE — Telephone Encounter (Signed)
 Patient would like to an in-lab puls ox to be completed so she can get accurate reading. Her at home pulse ox and sleep study were inconclusive that it was she would like an in lab sleep study. Please reach out to patient to get this scheduled.

## 2024-06-13 NOTE — Telephone Encounter (Signed)
 Disability paperwork has been received via fax by [employer]. Patient will be called and notified for $29 dollar fee and 14 day business day policy. Paper will be placed in Dr.Hunsucker's box for completion.

## 2024-06-13 NOTE — Telephone Encounter (Signed)
 Pt said she got an email requesting disabillity form to be redone. They also sent us  a copy of the disability form but she completed some herself.

## 2024-06-13 NOTE — Patient Instructions (Signed)

## 2024-06-13 NOTE — Progress Notes (Signed)
 1600 PATIENT CALLED BACK C/O HEADACHE WORSENED FROM WHEN SHE WAS HERE ABOUT AN HOUR AGO. THE PATIENT DID NOT RECEIVE PEPCID  IV OR TYLENOL  TODAY PRIOR TO HER VENOFER. I INSTRUCTED THE PATIENT TO TAKE TYLENOL  650MG  AND SOME PEPCID  1 TABLET PO OVER THE COUNTER. THE PATIENT STATED THAT SHE HAS TYLENOL  BUT NO PEPCID  AT HOME. I FURTHER INSTRUCTED THE PATIENT TO CALL [phone] IF IT GETS WORSE AFTER THE TYLENOL .THE PATIENT VERBALIZED UNDERSTANDING. I INFORMED THE PHARMACIST SUSAN PHY. SHE AGREED WITH THE ABOVE INSTRUCTIONS. SUSAN PHY, PHARMD WILL PUT AN ORDER FOR PEPCID  IV AND TYLENOL  WITH THE PATIENT'S NEXT VENOFERS. WE ALSO INFORMED MELISSA PARSONS, NP OF THE ABOVE INFORMATION.

## 2024-06-13 NOTE — Telephone Encounter (Signed)
 Disregard for now. Patient would like disability from multiple of her doctors and will contact us  if needed.

## 2024-06-13 NOTE — Progress Notes (Signed)
 Patient c/o headache and fullness after treatment of venofer #2. Patient stayed for additional 30 mins after treatment ended and monitored VS. Patient verbalized at end of 30 mins she feels comfortable driving home. Devere Manzanilla made aware of rxn to treatment.

## 2024-06-14 ENCOUNTER — Telehealth: Payer: Self-pay

## 2024-06-14 ENCOUNTER — Encounter: Admitting: Registered Nurse

## 2024-06-14 ENCOUNTER — Inpatient Hospital Stay

## 2024-06-14 ENCOUNTER — Inpatient Hospital Stay (HOSPITAL_BASED_OUTPATIENT_CLINIC_OR_DEPARTMENT_OTHER): Admitting: Hematology and Oncology

## 2024-06-14 ENCOUNTER — Other Ambulatory Visit: Payer: Self-pay | Admitting: Hematology and Oncology

## 2024-06-14 ENCOUNTER — Encounter (HOSPITAL_BASED_OUTPATIENT_CLINIC_OR_DEPARTMENT_OTHER): Payer: Self-pay | Admitting: Student

## 2024-06-14 VITALS — BP 131/68 | HR 83 | Temp 97.4°F | Resp 18 | Ht 63.0 in

## 2024-06-14 DIAGNOSIS — D509 Iron deficiency anemia, unspecified: Secondary | ICD-10-CM | POA: Diagnosis not present

## 2024-06-14 DIAGNOSIS — D649 Anemia, unspecified: Secondary | ICD-10-CM | POA: Diagnosis not present

## 2024-06-14 LAB — MULTIPLE MYELOMA PANEL, SERUM
Albumin SerPl Elph-Mcnc: 3.4 g/dL (ref 2.9–4.4)
Albumin/Glob SerPl: 0.8 (ref 0.7–1.7)
Alpha 1: 0.3 g/dL (ref 0.0–0.4)
Alpha2 Glob SerPl Elph-Mcnc: 1 g/dL (ref 0.4–1.0)
B-Globulin SerPl Elph-Mcnc: 1.2 g/dL (ref 0.7–1.3)
Gamma Glob SerPl Elph-Mcnc: 1.7 g/dL (ref 0.4–1.8)
Globulin, Total: 4.3 g/dL — ABNORMAL HIGH (ref 2.2–3.9)
IgA: 416 mg/dL — ABNORMAL HIGH (ref 87–352)
IgG (Immunoglobin G), Serum: 1825 mg/dL — ABNORMAL HIGH (ref 586–1602)
IgM (Immunoglobulin M), Srm: 101 mg/dL (ref 26–217)
Total Protein ELP: 7.7 g/dL (ref 6.0–8.5)

## 2024-06-14 MED ORDER — FAMOTIDINE IN NACL 20-0.9 MG/50ML-% IV SOLN
20.0000 mg | Freq: Once | INTRAVENOUS | Status: AC
  Administered 2024-06-14: 20 mg via INTRAVENOUS
  Filled 2024-06-14: qty 50

## 2024-06-14 MED ORDER — ACETAMINOPHEN 325 MG PO TABS
650.0000 mg | ORAL_TABLET | Freq: Four times a day (QID) | ORAL | Status: DC | PRN
  Administered 2024-06-14: 650 mg via ORAL
  Filled 2024-06-14: qty 2

## 2024-06-14 MED ORDER — SODIUM CHLORIDE 0.9 % IV SOLN: INTRAVENOUS | Status: DC

## 2024-06-14 NOTE — Patient Instructions (Signed)

## 2024-06-14 NOTE — Progress Notes (Signed)
 Casey parsons, np over to evaluate pt's symptoms.

## 2024-06-14 NOTE — Telephone Encounter (Signed)
 Noted

## 2024-06-14 NOTE — Telephone Encounter (Signed)
 Pt calling, & spouse pretty much demanding for pt to come in and be seen. She has had a raging headache since yesterday (after iron infusion). Something doesn't feel right in my body. I had chills all night. Joint pain is worse. She doesn't have thermometer, so unsure if she had fever.  Message sent to Eleanor Harl PIETY, [facility], & Bartley Jeni PEAK infusion charge nurse. Rae,RN: SHE CALLED ABOUT AN HOUR AFTER SHE LEFT AND TOLD US  THE SAME THING. I INFORMED HER AT THE TIME TO TRY TYLENOL  AND PEPCID  PO. SHE DID NOT HAVE PEPCID . THEN I INFORMED HER IF IT GOT WORSE TO GO TO ED.  [facility] Phy,RPH: These sound more like side effects to me and not a true allergy . She apparently is sensitive to medications based on her allergy  list. We could switch her to another iv iron but venofer has the lowest reactions/side effect profile of them all.  Metta Koranda,RN: I verified she did take tylenol , but NOT Pepcid   she didn't have any and wasn't able to drive to get any) as instructed by Bartley, infusion charge nurse. Rae,RN: YEAP THAT IS WHAT SHE SAID. WE HAVE A SPOT AVAILABLE TODAY AT 1130. WHAT WOULD WE GIVE HER SUSAN?  Meleni Delahunt,RN: Pt coming in at 1130. Melissa,NP:I can see her back in infusion.

## 2024-06-15 ENCOUNTER — Telehealth (HOSPITAL_BASED_OUTPATIENT_CLINIC_OR_DEPARTMENT_OTHER): Payer: Self-pay

## 2024-06-15 NOTE — Telephone Encounter (Signed)
 See husband's chart.

## 2024-06-18 NOTE — Telephone Encounter (Signed)
Please order in lab sleep study

## 2024-06-19 ENCOUNTER — Inpatient Hospital Stay

## 2024-06-19 VITALS — BP 132/100 | HR 105 | Temp 97.9°F | Resp 18

## 2024-06-19 DIAGNOSIS — D649 Anemia, unspecified: Secondary | ICD-10-CM | POA: Diagnosis not present

## 2024-06-19 DIAGNOSIS — D509 Iron deficiency anemia, unspecified: Secondary | ICD-10-CM

## 2024-06-19 MED ORDER — SODIUM CHLORIDE 0.9 % IV SOLN: INTRAVENOUS | Status: DC

## 2024-06-19 MED ORDER — IRON SUCROSE 20 MG/ML IV SOLN
200.0000 mg | Freq: Once | INTRAVENOUS | Status: AC
  Administered 2024-06-19: 200 mg via INTRAVENOUS
  Filled 2024-06-19: qty 10

## 2024-06-19 MED ORDER — FAMOTIDINE IN NACL 20-0.9 MG/50ML-% IV SOLN
20.0000 mg | Freq: Once | INTRAVENOUS | Status: AC
  Administered 2024-06-19: 20 mg via INTRAVENOUS
  Filled 2024-06-19: qty 50

## 2024-06-19 MED ORDER — ACETAMINOPHEN 325 MG PO TABS
650.0000 mg | ORAL_TABLET | Freq: Once | ORAL | Status: AC
  Administered 2024-06-19: 650 mg via ORAL
  Filled 2024-06-19: qty 2

## 2024-06-19 NOTE — Patient Instructions (Signed)

## 2024-06-19 NOTE — Telephone Encounter (Signed)
 Order placed patient informed.NFN

## 2024-06-20 ENCOUNTER — Other Ambulatory Visit: Payer: Self-pay | Admitting: Hematology and Oncology

## 2024-06-20 ENCOUNTER — Inpatient Hospital Stay

## 2024-06-20 ENCOUNTER — Ambulatory Visit (INDEPENDENT_AMBULATORY_CARE_PROVIDER_SITE_OTHER)

## 2024-06-20 VITALS — BP 135/73 | HR 84 | Temp 98.4°F | Resp 18

## 2024-06-20 DIAGNOSIS — D509 Iron deficiency anemia, unspecified: Secondary | ICD-10-CM

## 2024-06-20 DIAGNOSIS — D649 Anemia, unspecified: Secondary | ICD-10-CM | POA: Diagnosis not present

## 2024-06-20 DIAGNOSIS — J309 Allergic rhinitis, unspecified: Secondary | ICD-10-CM | POA: Diagnosis not present

## 2024-06-20 MED ORDER — SODIUM CHLORIDE 0.9 % IV SOLN: INTRAVENOUS | Status: DC

## 2024-06-20 MED ORDER — ACETAMINOPHEN 325 MG PO TABS
650.0000 mg | ORAL_TABLET | Freq: Once | ORAL | Status: AC
  Administered 2024-06-20: 650 mg via ORAL
  Filled 2024-06-20: qty 2

## 2024-06-20 MED ORDER — IRON SUCROSE 20 MG/ML IV SOLN
200.0000 mg | Freq: Once | INTRAVENOUS | Status: AC
  Administered 2024-06-20: 200 mg via INTRAVENOUS
  Filled 2024-06-20: qty 10

## 2024-06-20 MED ORDER — FAMOTIDINE IN NACL 20-0.9 MG/50ML-% IV SOLN
20.0000 mg | Freq: Once | INTRAVENOUS | Status: AC
  Administered 2024-06-20: 20 mg via INTRAVENOUS
  Filled 2024-06-20: qty 50

## 2024-06-20 NOTE — Progress Notes (Signed)
 1500 pt stated she was filling a little woozy, vitals stable. Notified [facility] and [place], NP. Patient wanted to wait 30 more minutes to go home. D/c stable no complaints.

## 2024-06-20 NOTE — Patient Instructions (Signed)

## 2024-06-21 ENCOUNTER — Other Ambulatory Visit: Payer: Self-pay

## 2024-06-21 ENCOUNTER — Ambulatory Visit (INDEPENDENT_AMBULATORY_CARE_PROVIDER_SITE_OTHER): Admitting: Orthopedic Surgery

## 2024-06-21 DIAGNOSIS — M25571 Pain in right ankle and joints of right foot: Secondary | ICD-10-CM

## 2024-06-21 DIAGNOSIS — M25572 Pain in left ankle and joints of left foot: Secondary | ICD-10-CM

## 2024-06-22 ENCOUNTER — Encounter: Payer: Self-pay | Admitting: Hematology and Oncology

## 2024-06-22 NOTE — Progress Notes (Signed)
 [facility] CONSULT NOTE  Patient Care Team: Rothfuss, Jacob T, PA-C as PCP - General (Physician Assistant) Michele Richardson, DO as PCP - Cardiology (Cardiology)  ASSESSMENT & PLAN:  Anemia: Worsening symptoms of anemia including shortness of breath and fatigue. CBC reveals hemoglobin 11.1 with recent iron saturation 6 and ferritin 7. We will plan for IV iron having discussed potential side effects including infusion reactions. We will proceed with IV iron in the form of Venofer and have her return 4 weeks after last infusion for repeat evaluation.    All questions were answered. The patient knows to call the clinic with any problems, questions or concerns.  The total time spent in the appointment was 45 minutes encounter with patients including review of chart and various tests results, discussions about plan of care and coordination of care plan  Casey DELENA Bach, NP 11/14/202510:40 AM   CHIEF COMPLAINTS/PURPOSE OF CONSULTATION:  Anemia  HISTORY OF PRESENTING ILLNESS:  Casey [name] 53 y.o. female is here because of anemia  She was found to have abnormal CBC from 10/31 She denies recent chest pain on exertion, pre-syncopal episodes, or palpitations. She complains of shortness of breath and fatigue. She had not noticed any recent bleeding such as epistaxis, hematuria or hematochezia The patient denies over the counter NSAID ingestion. She is not  on antiplatelets agents. Her last colonoscopy was n/a She had no prior history or diagnosis of cancer. Her age appropriate screening programs are up-to-date. She denies any pica and eats a variety of diet. She never donated blood or received blood transfusion The patient was not prescribed oral iron supplements  MEDICAL HISTORY:  Past Medical History:  Diagnosis Date   Albuminuria 06/27/2015   Anxiety 11/04/2014   Arm DVT (deep venous thromboembolism), acute, left (HCC) 08/17/2022   Arthritis associated with inflammatory  bowel disease 11/30/2014   Asthma    Atopic dermatitis 10/30/2008   Formatting of this note might be different from the original. Atopic dermatitis Formatting of this note might be different from the original. Formatting of this note might be different from the original. Atopic dermatitis Formatting of this note might be different from the original. Formatting of this note might be different from the original. Formatting of this note might be different from the or   Cataract 05/06/2015   Contusion of left wrist 07/08/2021   Coronary artery disease s/p PCI/DES to LAD 07/01/2023 12/28/2022   Crohn's disease (HCC) 04/21/2015   De Quervain's tenosynovitis, left 07/31/2021   Diabetes mellitus without complication (HCC)    Diabetes type 2, controlled (HCC) 04/21/2015   Dry eyes 05/06/2015   DVT (deep venous thrombosis) (HCC)    Dysfunction of both eustachian tubes 08/31/2012   Encounter for monitoring immunomodulating therapy 11/20/2021   Environmental allergies 08/12/2020   Esophageal candidiasis (HCC) 06/26/2022   Essential hypertension 06/27/2015   Fibrositis 08/28/2014   Formatting of this note might be different from the original. Fibromyalgia Formatting of this note might be different from the original. Formatting of this note might be different from the original. Fibromyalgia   Flank pain 11/24/2020   Gastroesophageal reflux disease with esophagitis without hemorrhage 08/01/2019   Formatting of this note might be different from the original. Formatting of this note might be different from the original. 07/2019: chronic symptoms of esophageal reflux since prior to sleeve gastrectomy surgery. Symptoms have worsened over the last 6 months with recent EGD findings of grade B esophagitis, irregular z line, erythematous mucosa, and  evidence of sleeve gastrectomy. Biopsies with ch   Glaucoma suspect of both eyes 05/07/2016   Hematochezia 03/18/2016   Hematuria 06/18/2015   Hiatal hernia  10/07/2022   History of abnormal cervical Pap smear 11/29/2014   History of colon polyps 06/19/2019   Formatting of this note might be different from the original. Formatting of this note might be different from the original. 07/2019: 8 mm colonic polyp removed during colonoscopy, biopsy showed serrated polyp. Repeat colonoscopy recommended in one year. Formatting of this note might be different from the original. Added automatically from request for surgery [id number] Formatting of this note might b   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   History of kidney stones 08/12/2020   Hypercholesterolemia 10/07/2022   Hypertension, essential, benign 06/27/2015   Hypertensive disorder 08/28/2014   Formatting of this note might be different from the original. Hypertension Formatting of this note might be different from the original. Formatting of this note might be different from the original. 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 Keratoconus Formatting of this note might be different from the original. Formatting of this note might be different from the original. Keratoconus Formatting of this note might be different from the original. Formatting of this note might be different from the original. Keratoconus Formatting of this note might be different from the or   Microhematuria 06/27/2015   Migraine 08/28/2014   Formatting of this note might be different from the original. Migraine Formatting of this note might be different from the original. Formatting of this note might be different from the original. Migraine Formatting of this note might be different from the original. Migraine Formatting of this note might be different from the original. Migraine Formatting of this note might be different from the or   Migraine without aura  and without status migrainosus, not intractable 08/28/2014   Formatting of this note might be different from the original. Formatting of this note might be different from the original. Formatting of this note might be different from the original. Migraine Formatting of this note might be different from the original. Migraine Formatting of this note might be different from the original. Formatting of this note might be different from the original. Migraine F   Mild intermittent asthma without complication 10/30/2008   Formatting of this note might be different from the original. Formatting of this note might be different from the original. Formatting of this note might be different from the original. Asthma Formatting of this note might be different from the original. Asthma Formatting of this note might be different from the original. Formatting of this note might be different from the original. 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 Formatting of this note  might be different from the original. 01/25/2016 with Dr. Nari Sabeti at [facility]. [place] of this note might be different from the original. 01/25/2016 with Dr. Loney Pol at [facility]. [place] of this note might be different from the original. Formatting of this note  might be different from    Seronegative inflammatory arthritis 08/28/2014   Formatting of this note might be different from the original. Formatting of this note might be different from the original. Arthritis; diagnosed as IBD related - 2012 Formatting of this note might be different from the original. Followed by rheumatologist. Had to change providers due to an insurance change earlier this year resulting in patient not being able to have Remicade  for ~6 months. 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 Formatting of this note might be different from the original. Treated with PRN miralax . Recent colonoscopy 07/2019. Formatting of this note might be different from the original. Treated with PRN miralax . Recent colonoscopy 07/2019. Formatting of this note might be different from the original. Formatting of this note might be different f   Status post placement of ureteral stent 02/06/2021   Tooth infection 08/12/2020   Unstable angina (HCC) 07/29/2023   Ureteral stone 01/20/2022   Urinary tract infection, site not specified 06/26/2022    SURGICAL HISTORY: Past Surgical History:  Procedure Laterality Date   BACK SURGERY     L5-S1   CERVICAL CONE BIOPSY  04/2000   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   CORONARY STENT INTERVENTION N/A 07/01/2023   Procedure: CORONARY STENT INTERVENTION;  Surgeon: Darron Deatrice LABOR, MD;  Location: [facility];  Service: Cardiovascular;  Laterality: N/A;   EYE SURGERY     GASTRIC BYPASS  2017   Sleve   LAPAROSCOPIC GASTRIC SLEEVE RESECTION     LEFT HEART CATH AND CORONARY ANGIOGRAPHY N/A 07/01/2023   Procedure: LEFT HEART CATH AND CORONARY ANGIOGRAPHY;  Surgeon: Darron Deatrice LABOR, MD;  Location: [facility];  Service: Cardiovascular;  Laterality: N/A;   LEFT HEART CATH AND CORONARY ANGIOGRAPHY N/A 08/01/2023   Procedure: LEFT HEART CATH  AND CORONARY ANGIOGRAPHY;  Surgeon: Ladona Heinz, MD;  Location: [facility];  Service: Cardiovascular;  Laterality: N/A;   Low back disc surgery     06/2000   TOTAL KNEE ARTHROPLASTY Right 11/2016    SOCIAL HISTORY: Social History   Socioeconomic History   Marital status: Married    Spouse name: Not on file   Number of children: 0   Years of education: Not on file   Highest education level: Professional school degree (e.g., MD, DDS, DVM, JD)  Occupational History   Not on file  Tobacco Use   Smoking status: Never    Passive exposure: Never   Smokeless tobacco: Never  Vaping Use   Vaping status: Never Used  Substance and Sexual Activity   Alcohol use: Not Currently    Comment: rare   Drug use: Never   Sexual activity: Not Currently  Other Topics Concern   Not on file  Social History Narrative   2 dogs    Social Drivers of Health   Financial Resource Strain: Low Risk  (02/12/2024)   Overall Financial Resource Strain (CARDIA)    Difficulty of Paying Living Expenses: Not very hard  Food Insecurity: No Food Insecurity (06/08/2024)   Hunger Vital Sign    Worried About Running Out  of Food in the Last Year: Never true    Ran Out of Food in the Last Year: Never true  Recent Concern: Food Insecurity - Food Insecurity Present (06/08/2024)   Hunger Vital Sign    Worried About Running Out of Food in the Last Year: Sometimes true    Ran Out of Food in the Last Year: Never true  Transportation Needs: No Transportation Needs (06/08/2024)   PRAPARE - Administrator, Civil Service (Medical): No    Lack of Transportation (Non-Medical): No  Physical Activity: Inactive (02/12/2024)   Exercise Vital Sign    Days of Exercise per Week: 0 days    Minutes of Exercise per Session: Not on file  Stress: No Stress Concern Present (02/12/2024)   [employer] of Occupational Health - Occupational Stress Questionnaire    Feeling of Stress: Not at all  Social Connections:  Moderately Integrated (02/12/2024)   Social Connection and Isolation Panel    Frequency of Communication with Friends and Family: More than three times a week    Frequency of Social Gatherings with Friends and Family: Once a week    Attends Religious Services: Never    Database Administrator or Organizations: Yes    Attends Banker Meetings: 1 to 4 times per year    Marital Status: Married  Catering Manager Violence: Not At Risk (06/08/2024)   Humiliation, Afraid, Rape, and Kick questionnaire    Fear of Current or Ex-Partner: No    Emotionally Abused: No    Physically Abused: No    Sexually Abused: No    FAMILY HISTORY: Family History  Adopted: Yes  Problem Relation Age of Onset   Diabetes Mother    Hypertension Mother    Diabetes Father    Hypertension Father    Hypertension Sister    Diabetes Sister    Hypertension Brother    Diabetes Brother    Diabetes Paternal Grandmother    Cancer Other    Asthma Neg Hx    Allergic rhinitis Neg Hx    Atopy Neg Hx    Eczema Neg Hx     ALLERGIES:  is allergic to albuterol  sulfate, corticosteroids, silicone, tape, wound dressings, formoterol, hydrocodone , hydrocodone -acetaminophen , nickel, other, oxycodone -acetaminophen , salmeterol, strawberry extract, advair hfa [fluticasone-salmeterol], proventil  hfa [albuterol ], asa [aspirin ], cleocin [clindamycin], clindamycin/lincomycin, fludrocortisone acetate, gabapentin, hydrochlorothiazide, imipramine, ipratropium bromide, medroxyprogesterone, meloxicam, metformin and related, nystatin, penicillins, pred forte [prednisolone], prednisone, solu-medrol  [methylprednisolone ], sulfa antibiotics, tegaderm ag mesh [silver], toradol  [ketorolac  tromethamine ], and tramadol.  MEDICATIONS:  Current Outpatient Medications  Medication Sig Dispense Refill   acetaminophen  (TYLENOL ) 500 MG tablet Take 1,000 mg by mouth every 12 (twelve) hours as needed for mild pain or moderate pain.     albuterol   (PROVENTIL ) (2.5 MG/3ML) 0.083% nebulizer solution Take 3 mLs (2.5 mg total) by nebulization every 4 (four) hours as needed for wheezing or shortness of breath. 540 mL 12   aMILoride (MIDAMOR) 5 MG tablet Take 10 mg by mouth daily.     atorvastatin  (LIPITOR) 40 MG tablet Take 2 tablets (80 mg total) by mouth daily. 180 tablet 3   B Complex-C (B-COMPLEX WITH VITAMIN C) tablet Take 1 tablet by mouth daily.     Cholecalciferol  (VITAMIN D3) 50 MCG (2000 UT) TABS Take 1 tablet by mouth daily.     diphenhydramine -acetaminophen  (TYLENOL  PM) 25-500 MG TABS tablet Take 1 tablet by mouth at bedtime as needed.     EPINEPHrine  (EPIPEN  2-PAK) 0.3 mg/0.3 mL IJ  SOAJ injection Inject 0.3 mg into the muscle as needed for anaphylaxis. 2 each 2   Evolocumab  (REPATHA  SURECLICK) 140 MG/ML SOAJ Inject 140 mg into the skin every 14 (fourteen) days. 2 mL 11   famotidine  (PEPCID ) 40 MG tablet Take 40 mg by mouth 2 (two) times daily.     levocetirizine (XYZAL) 5 MG tablet Take 10 mg by mouth daily.     losartan (COZAAR) 50 MG tablet Take 50 mg by mouth daily.     lubiprostone (AMITIZA) 8 MCG capsule Take 8 mcg by mouth 2 (two) times daily. (Patient taking differently: Take 8 mcg by mouth daily.)     montelukast  (SINGULAIR ) 10 MG tablet Take 1 tablet (10 mg total) by mouth daily. 90 tablet 1   nitroGLYCERIN  (NITROSTAT ) 0.4 MG SL tablet Place 1 tablet (0.4 mg total) under the tongue every 5 (five) minutes as needed for chest pain. 30 tablet 3   oxyCODONE  (OXY IR/ROXICODONE ) 5 MG immediate release tablet Take 1 tablet (5 mg total) by mouth at bedtime. 30 tablet 0   RABEprazole (ACIPHEX) 20 MG tablet Take 20 mg by mouth 2 (two) times daily.     Secukinumab (COSENTYX SENSOREADY PEN) 150 MG/ML SOAJ Inject 150 mg into the skin.     ticagrelor  (BRILINTA ) 90 MG TABS tablet Take 1 tablet (90 mg total) by mouth 2 (two) times daily. 180 tablet 3   triamcinolone  cream (KENALOG ) 0.1 % Apply 1 Application topically 2 (two) times daily. 30  g 0   Difluprednate 0.05 % EMUL Apply 1 drop to eye. 1 drop into left eye twice daily for 30 days (Patient not taking: Reported on 06/08/2024)     No current facility-administered medications for this visit.    REVIEW OF SYSTEMS:   Constitutional: Denies fevers, chills or abnormal night sweats Eyes: Denies blurriness of vision, double vision or watery eyes Ears, nose, mouth, throat, and face: Denies mucositis or sore throat Respiratory: Denies cough, dyspnea or wheezes Cardiovascular: Denies palpitation, chest discomfort or lower extremity swelling Gastrointestinal:  Denies nausea, heartburn or change in bowel habits Skin: Denies abnormal skin rashes Lymphatics: Denies new lymphadenopathy or easy bruising Neurological:Denies numbness, tingling or new weaknesses Behavioral/Psych: Mood is stable, no new changes  All other systems were reviewed with the patient and are negative.  PHYSICAL EXAMINATION: ECOG PERFORMANCE STATUS: 1 - Symptomatic but completely ambulatory  Vitals:   06/08/24 0935  BP: 128/79  Pulse: 73  Resp: 18  Temp: 98 F (36.7 C)  SpO2: 100%   Filed Weights   06/08/24 0935  Weight: 212 lb 3.2 oz (96.3 kg)    GENERAL:alert, no distress and comfortable SKIN: skin color, texture, turgor are normal, no rashes or significant lesions EYES: normal, conjunctiva are pink and non-injected, sclera clear OROPHARYNX:no exudate, no erythema and lips, buccal mucosa, and tongue normal  NECK: supple, thyroid  normal size, non-tender, without nodularity LYMPH:  no palpable lymphadenopathy in the cervical, axillary or inguinal LUNGS: clear to auscultation and percussion with normal breathing effort HEART: regular rate & rhythm and no murmurs and no lower extremity edema ABDOMEN:abdomen soft, non-tender and normal bowel sounds Musculoskeletal:no cyanosis of digits and no clubbing  PSYCH: alert & oriented x 3 with fluent speech NEURO: no focal motor/sensory  deficits  RADIOGRAPHIC STUDIES: I have personally reviewed the radiological images as listed and agreed with the findings in the report. No results found.

## 2024-06-25 ENCOUNTER — Encounter: Payer: Self-pay | Admitting: Orthopedic Surgery

## 2024-06-25 DIAGNOSIS — M25572 Pain in left ankle and joints of left foot: Secondary | ICD-10-CM | POA: Diagnosis not present

## 2024-06-25 DIAGNOSIS — M25571 Pain in right ankle and joints of right foot: Secondary | ICD-10-CM | POA: Diagnosis not present

## 2024-06-25 MED ORDER — LIDOCAINE HCL 1 % IJ SOLN
2.0000 mL | INTRAMUSCULAR | Status: AC | PRN
  Administered 2024-06-25: 2 mL

## 2024-06-25 MED ORDER — METHYLPREDNISOLONE ACETATE 40 MG/ML IJ SUSP
40.0000 mg | INTRAMUSCULAR | Status: AC | PRN
  Administered 2024-06-25: 40 mg via INTRA_ARTICULAR

## 2024-06-25 NOTE — Progress Notes (Signed)
 Office Visit Note   Patient: Casey [name]           Date of Birth: March 22, 1971           MRN: [id number] Visit Date: 06/21/2024              Requested by: Iven Lang DASEN, PA-C [address] [place],  [place] [address] PCP: Iven Lang DASEN, PA-C  Chief Complaint  Patient presents with   Right Ankle - Pain   Left Ankle - Pain      HPI: Discussed the use of AI scribe software for clinical note transcription with the patient, who gave verbal consent to proceed.  History of Present Illness Casey [name] is a 53 year old female who presents with bilateral ankle pain.  She experiences pain in both ankles, primarily located in the joint and radiating from the outer side to the inner side, extending down the top of her foot. Sharp pains are also noted in the back of her leg. The pain occurs with walking and is described as a snapping sensation, particularly in the left ankle, which she noticed upon stepping down this morning.  The left ankle is more frequently problematic, and she sometimes hears a 'snap' like a bubble popping. She wears a boot on the left ankle when she feels it might 'go out' on her, although she finds the boot heavy. The condition has improved compared to three weeks ago when she could not flex her foot.  She also describes a recent episode involving her hip, where she felt a popping sensation while bending over, which resolved spontaneously. This was attributed to the iliotibial band snapping over the hip bone.     Assessment & Plan: Visit Diagnoses:  1. Acute bilateral ankle pain     Plan: Assessment and Plan Assessment & Plan Bilateral ankle pain Pain localized anteriorly over the ankle joint without infection. Good range of motion. Radiographs show no structural defects. Pain exacerbated by walking. No structural issues identified, ruling out rheumatoid arthritis. - Administered steroid injection in both ankles. - Recommended avoiding use of the boot to  promote continued ankle movement.  Bilateral foot pain Pain extending from the outer to the inner side of the foot, down the top, with occasional sharp pains in the back of the leg. No structural defects on radiographs. Pain exacerbated by walking. - Administered steroid injection in both ankles.      Follow-Up Instructions: No follow-ups on file.   Ortho Exam  Patient is alert, oriented, no adenopathy, well-dressed, normal affect, normal respiratory effort. Physical Exam EXTREMITIES: Pain anteriorly over the ankle joint. No redness, cellulitis, or signs of infection in the ankle. Good range of motion of the ankle.      Imaging: No results found. No images are attached to the encounter.  Labs: Lab Results  Component Value Date   HGBA1C 6.1 (H) 05/08/2024   HGBA1C 6.0 (H) 09/06/2023   HGBA1C 5.8 11/30/2022   ESRSEDRATE 46 (H) 10/24/2023   ESRSEDRATE 40 (H) 07/31/2023   CRP 0.8 07/31/2023   LABURIC 6.7 02/22/2024   REPTSTATUS 11/16/2023 FINAL 11/10/2023   REPTSTATUS 11/16/2023 FINAL 11/10/2023   CULT  11/10/2023    NO GROWTH 5 DAYS Performed at [facility], [address], [place], [place] [address]    CULT  11/10/2023    NO GROWTH 5 DAYS Performed at [facility], [address] [place], [place] [address]    IDOLINA  ESCHERICHIA COLI (A) 07/30/2023     Lab Results  Component Value Date   ALBUMIN 4.1 06/08/2024   ALBUMIN 3.5 01/07/2024   ALBUMIN 4.2 11/30/2023    Lab Results  Component Value Date   MG 1.9 02/19/2024   MG 1.9 01/07/2024   MG 2.0 11/30/2023   No results found for: VD25OH  No results found for: PREALBUMIN    Latest Ref Rng & Units 06/08/2024    8:45 AM 06/04/2024    1:47 PM 05/01/2024    8:03 PM  CBC EXTENDED  WBC 4.0 - 10.5 K/uL 12.8  12.6  13.1   RBC 3.87 - 5.11 MIL/uL 4.71  5.04  4.97   Hemoglobin 12.0 - 15.0 g/dL 88.8  88.3  88.3   HCT 36.0 - 46.0 % 36.2  39.0  38.4   Platelets 150 - 400 K/uL 456  506  417    NEUT# 1.7 - 7.7 K/uL 7.8  8.6    Lymph# 0.7 - 4.0 K/uL 3.8  3.0       There is no height or weight on file to calculate BMI.  Orders:  Orders Placed This Encounter  Procedures   XR Ankle Complete Right   XR Ankle Complete Left   No orders of the defined types were placed in this encounter.    Procedures: Medium Joint Inj: bilateral ankle on 06/25/2024 4:22 PM Indications: pain and diagnostic evaluation Details: 22 G 1.5 in needle, anteromedial approach Medications (Right): 2 mL lidocaine  1 %; 40 mg methylPREDNISolone  acetate 40 MG/ML Medications (Left): 2 mL lidocaine  1 %; 40 mg methylPREDNISolone  acetate 40 MG/ML Outcome: tolerated well, no immediate complications Procedure, treatment alternatives, risks and benefits explained, specific risks discussed. Consent was given by the patient. Immediately prior to procedure a time out was called to verify the correct patient, procedure, equipment, support staff and site/side marked as required. Patient was prepped and draped in the usual sterile fashion.      Clinical Data: No additional findings.  ROS:  All other systems negative, except as noted in the HPI. Review of Systems  Objective: Vital Signs: There were no vitals taken for this visit.  Specialty Comments:  No specialty comments available.  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   Coronary artery disease s/p PCI/DES to LAD 07/01/2023 12/28/2022   Hyperlipidemia 10/07/2022   Hiatal hernia 10/07/2022   Esophageal candidiasis (HCC) 06/26/2022    Rheumatoid arthritis of multiple sites with 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 factor (HCC) 11/16/2021   De Quervain's tenosynovitis, left 07/31/2021   Status post placement of ureteral stent 02/06/2021   Proteinuria 08/12/2020   Environmental allergies 08/12/2020   History of kidney stones 08/12/2020   Gastroesophageal reflux disease with esophagitis without hemorrhage 08/01/2019   Slow transit constipation 08/01/2019   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   History of colon polyps 06/19/2019   Irregular astigmatism of both eyes 10/11/2018   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   Myopia with astigmatism and presbyopia 05/07/2016   Overactive bladder 11/18/2015   Incontinence 10/07/2015  Essential hypertension 06/27/2015   Cataract 05/06/2015   Dry eyes 05/06/2015   Crohn's disease (HCC) 04/21/2015   Osteoarthritis of both knees 04/21/2015   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/30/2014   History of abnormal cervical Pap smear 11/29/2014   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 Atopic dermatitis Formatting of this note might be different from the original. Formatting of this note might be different from the original. Atopic dermatitis Formatting of this note might be different from the original. Formatting of this note might be different from  the original. Formatting of this note might be different from the or   Cataract 05/06/2015   Contusion of left wrist 07/08/2021   Coronary artery disease s/p PCI/DES to LAD 07/01/2023 12/28/2022   Crohn's disease (HCC) 04/21/2015   De Quervain's tenosynovitis, left 07/31/2021   Diabetes mellitus without complication (HCC)    Diabetes type 2, controlled (HCC) 04/21/2015   Dry eyes 05/06/2015   DVT (deep venous thrombosis) (HCC)    Dysfunction of both eustachian tubes 08/31/2012   Encounter for monitoring immunomodulating therapy 11/20/2021   Environmental allergies 08/12/2020   Esophageal candidiasis (HCC) 06/26/2022   Essential hypertension 06/27/2015   Fibrositis 08/28/2014   Formatting of this note might be different from the original. Fibromyalgia Formatting of this note might be different from the original. Formatting of this note might be different from the original. Fibromyalgia   Flank pain 11/24/2020   Gastroesophageal reflux disease with esophagitis without hemorrhage 08/01/2019   Formatting of this note might be different from the original. Formatting of this note might be different from the original. 07/2019: chronic symptoms of esophageal reflux since prior to sleeve gastrectomy surgery. Symptoms have worsened over the last 6 months with recent EGD findings of grade B esophagitis, irregular z line, erythematous mucosa, and evidence of sleeve gastrectomy. 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 Formatting of this note might be different from the original. 07/2019: 8 mm colonic polyp removed during colonoscopy, biopsy showed serrated polyp. Repeat colonoscopy recommended in one year. Formatting of this note might be different from the  original. Added automatically from request for surgery [phone] Formatting of this note might b   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   History of kidney stones 08/12/2020   Hypercholesterolemia 10/07/2022   Hypertension, essential, benign 06/27/2015   Hypertensive disorder 08/28/2014   Formatting of this note might be different from the original. Hypertension Formatting of this note might be different from the original. Formatting of this note might be different from the original. 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 Keratoconus Formatting of this note might be different from the original. Formatting of this note might be different from the original. Keratoconus Formatting of this note might be different from the original. Formatting of this note might be different from the original. Keratoconus Formatting of this note might be different from the or   Microhematuria 06/27/2015   Migraine 08/28/2014   Formatting of this note might be different from the original. Migraine Formatting of this note might be different from the original. Formatting of this note might be different from the original. Migraine Formatting of this note might be different from the original. Migraine Formatting of this note might be different from the original. Migraine Formatting of this note might be different from the or   Migraine without aura and without status migrainosus, not intractable 08/28/2014   Formatting of this note might be different from the original. Formatting of this note might be different from the original. Formatting of this note might be different from the original. Migraine Formatting of this note might be different from the original. Migraine Formatting of this note might be different from the original. Formatting of this  note might be different from the original. Migraine F   Mild intermittent asthma without complication 10/30/2008   Formatting of this note might be different from the original. Formatting of this note might be different from the original. Formatting of this note might be different from the original. Asthma Formatting of this note might be different from the original. Asthma Formatting of this note might be different from the original. Formatting of this note might be different from the original. 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 Formatting of this note might be different from the original. 01/25/2016 with Dr. Loney Pol at [facility]. [place] of this note might be different from the original. 01/25/2016 with Dr. Loney Pol at [facility]. [place] of this note might be different from the original. Formatting of this note might be different from    Seronegative inflammatory arthritis 08/28/2014   Formatting of this note might be different from the original. Formatting of this note might be different from the original. Arthritis; diagnosed as IBD related - 2012 Formatting of this note might be different from the original. Followed by rheumatologist. Had to change providers due to an insurance change earlier this year  resulting in patient not being able to have Remicade  for ~6 months. 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 Formatting of this note might be different from the original. Treated with PRN miralax . Recent colonoscopy 07/2019. Formatting of this note might be different from the original. Treated with PRN miralax . Recent colonoscopy 07/2019. Formatting of this note might be different from the original. Formatting of  this note might be different f   Status post placement of ureteral stent 02/06/2021   Tooth infection 08/12/2020   Unstable angina (HCC) 07/29/2023   Ureteral stone 01/20/2022   Urinary tract infection, site not specified 06/26/2022    Family History  Adopted: Yes  Problem Relation Age of Onset   Diabetes Mother    Hypertension Mother    Diabetes Father    Hypertension Father    Hypertension Sister    Diabetes Sister    Hypertension Brother    Diabetes Brother    Diabetes Paternal Grandmother    Cancer Other    Asthma Neg Hx    Allergic rhinitis Neg Hx    Atopy Neg Hx    Eczema Neg Hx     Past Surgical History:  Procedure Laterality Date   BACK SURGERY     L5-S1   CERVICAL CONE BIOPSY  04/2000   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   CORONARY STENT INTERVENTION N/A 07/01/2023   Procedure: CORONARY STENT INTERVENTION;  Surgeon: Darron Deatrice LABOR, MD;  Location: [facility];  Service: Cardiovascular;  Laterality: N/A;   EYE SURGERY     GASTRIC BYPASS  2017   Sleve   LAPAROSCOPIC GASTRIC SLEEVE RESECTION     LEFT HEART CATH AND CORONARY ANGIOGRAPHY N/A 07/01/2023   Procedure: LEFT HEART CATH AND CORONARY ANGIOGRAPHY;  Surgeon: Darron Deatrice LABOR, MD;  Location: [facility];  Service: Cardiovascular;  Laterality: N/A;   LEFT HEART CATH AND CORONARY ANGIOGRAPHY N/A 08/01/2023   Procedure: LEFT HEART CATH AND CORONARY ANGIOGRAPHY;  Surgeon: Ladona Heinz,  MD;  Location: [facility];  Service: Cardiovascular;  Laterality: N/A;   Low back disc surgery     06/2000   TOTAL KNEE ARTHROPLASTY Right 11/2016   Social History   Occupational History   Not on file  Tobacco Use   Smoking status: Never    Passive exposure: Never   Smokeless tobacco: Never  Vaping Use   Vaping status: Never Used  Substance and Sexual Activity   Alcohol use: Not Currently    Comment: rare   Drug use: Never   Sexual activity: Not Currently

## 2024-06-26 ENCOUNTER — Inpatient Hospital Stay

## 2024-06-26 VITALS — BP 138/70 | HR 81 | Temp 98.7°F | Resp 18

## 2024-06-26 DIAGNOSIS — D509 Iron deficiency anemia, unspecified: Secondary | ICD-10-CM

## 2024-06-26 DIAGNOSIS — D649 Anemia, unspecified: Secondary | ICD-10-CM | POA: Diagnosis not present

## 2024-06-26 MED ORDER — FAMOTIDINE IN NACL 20-0.9 MG/50ML-% IV SOLN
20.0000 mg | Freq: Once | INTRAVENOUS | Status: AC
  Administered 2024-06-26: 20 mg via INTRAVENOUS
  Filled 2024-06-26: qty 50

## 2024-06-26 MED ORDER — SODIUM CHLORIDE 0.9 % IV SOLN: INTRAVENOUS | Status: DC

## 2024-06-26 NOTE — Patient Instructions (Signed)
 Famotidine  Injection What is this medication? FAMOTIDINE  (fa MOE ti deen) treats stomach ulcers, reflux disease, or other conditions that cause too much stomach acid. It works by reducing the amount of acid in the stomach. This medicine Searson be used for other purposes; ask your health care provider or pharmacist if you have questions. COMMON BRAND NAME(S): Pepcid  What should I tell my care team before I take this medication? They need to know if you have any of these conditions: Kidney or liver disease An unusual or allergic reaction to famotidine , other medications, foods, dyes, or preservatives Pregnant or trying to get pregnant Breast-feeding How should I use this medication? This medication is for infusion into a vein. It is given in a hospital or clinic setting. Talk to your care team regarding the use of this medication in children. Special care Mah be needed. Overdosage: If you think you have taken too much of this medicine contact a poison control center or emergency room at once. NOTE: This medicine is only for you. Do not share this medicine with others. What if I miss a dose? This does not apply. What Latterell interact with this medication? Delavirdine Itraconazole Ketoconazole This list Jordan not describe all possible interactions. Give your health care provider a list of all the medicines, herbs, non-prescription drugs, or dietary supplements you use. Also tell them if you smoke, drink alcohol, or use illegal drugs. Some items Ankney interact with your medicine. What should I watch for while using this medication? Visit your care team for regular checks on your progress. Tell your care team if your symptoms do not start to get better or if they get worse. Avoid taking medications that contain aspirin , acetaminophen , ibuprofen, naproxen, or ketoprofen unless instructed by your care team. These can make your condition worse. Tobacco and alcohol Chaney irritate your stomach. This Camille increase  the time it takes for ulcers to heal. If you get black, tarry stools or vomit up what looks like coffee grounds, call your doctor or care team at once. You Lamoureaux have a bleeding ulcer. This medication Aaron cause a decrease in vitamin B12. You should make sure that you get enough vitamin B12 while you are taking this medication. Discuss the foods you eat and the vitamins you take with your care team. What side effects Manter I notice from receiving this medication? Side effects that you should report to your care team as soon as possible: Allergic reactions--skin rash, itching, hives, swelling of the face, lips, tongue, or throat Confusion Hallucinations Side effects that usually do not require medical attention (report to your care team if they continue or are bothersome): Constipation Diarrhea Dizziness Headache This list Gassner not describe all possible side effects. Call your doctor for medical advice about side effects. You Mckinstry report side effects to FDA at 1-800-FDA-1088. Where should I keep my medication? This medication is given in a hospital or clinic. You will not be given this medication to store at home. NOTE: This sheet is a summary. It Winnett not cover all possible information. If you have questions about this medicine, talk to your doctor, pharmacist, or health care provider.  2024 Elsevier/Gold Standard (2022-12-16 00:00:00)

## 2024-06-26 NOTE — Progress Notes (Signed)
 Pt reports having rash and feeling itchy every where after last venofer infusion.  Devere Manzanilla, Rph and Eleanor Mouse, np notified.  Iron canceled today.

## 2024-06-27 ENCOUNTER — Telehealth: Payer: Self-pay | Admitting: Cardiology

## 2024-06-27 LAB — HM DIABETES EYE EXAM

## 2024-06-27 NOTE — Telephone Encounter (Signed)
 Pt c/o medication issue:  1. Name of Medication:   Evolocumab  (REPATHA  SURECLICK) 140 MG/ML SOAJ   2. How are you currently taking this medication (dosage and times per day)?   As prescribed  3. Are you having a reaction (difficulty breathing--STAT)?   4. What is your medication issue?    Patient stated she wants to get a prescription card to continue getting this medication.  Patient noted she uses [facility] [facility], [place] - [address] but does not need a refill at this time just the prescription card.

## 2024-06-27 NOTE — Telephone Encounter (Signed)
 I spoke with patient and told her we did not have any cards in the office but she could go to the website to request this. Patient states she will be able to do this

## 2024-06-28 ENCOUNTER — Encounter: Payer: Self-pay | Admitting: Pharmacist

## 2024-06-28 DIAGNOSIS — I25110 Atherosclerotic heart disease of native coronary artery with unstable angina pectoris: Secondary | ICD-10-CM

## 2024-06-28 DIAGNOSIS — E785 Hyperlipidemia, unspecified: Secondary | ICD-10-CM

## 2024-07-03 ENCOUNTER — Telehealth: Payer: Self-pay | Admitting: Registered Nurse

## 2024-07-03 ENCOUNTER — Encounter: Admitting: Registered Nurse

## 2024-07-03 MED ORDER — OXYCODONE HCL 5 MG PO TABS: 5.0000 mg | ORAL_TABLET | Freq: Every day | ORAL | 0 refills | Status: DC

## 2024-07-03 NOTE — Telephone Encounter (Signed)
 Needs refill on oxycodone.

## 2024-07-06 ENCOUNTER — Telehealth: Admitting: Family Medicine

## 2024-07-06 DIAGNOSIS — R5383 Other fatigue: Secondary | ICD-10-CM

## 2024-07-06 DIAGNOSIS — D509 Iron deficiency anemia, unspecified: Secondary | ICD-10-CM

## 2024-07-06 NOTE — Patient Instructions (Signed)
 Casey [name], thank you for joining [name], PA-C for today's virtual visit.  While this provider is not your primary care provider (PCP), if your PCP is located in our provider database this encounter information will be shared with them immediately following your visit.   A [facility] MyChart account gives you access to today's visit and all your visits, tests, and labs performed at [facility]  click here if you don't have a [facility] MyChart account or go to [email or web address]  Consent: (Patient) Casey [name] provided verbal consent for this virtual visit at the beginning of the encounter.  Current Medications:  Current Outpatient Medications:    acetaminophen  (TYLENOL ) 500 MG tablet, Take 1,000 mg by mouth every 12 (twelve) hours as needed for mild pain or moderate pain., Disp: , Rfl:    albuterol  (PROVENTIL ) (2.5 MG/3ML) 0.083% nebulizer solution, Take 3 mLs (2.5 mg total) by nebulization every 4 (four) hours as needed for wheezing or shortness of breath., Disp: 540 mL, Rfl: 12   aMILoride (MIDAMOR) 5 MG tablet, Take 10 mg by mouth daily., Disp: , Rfl:    atorvastatin  (LIPITOR) 40 MG tablet, Take 2 tablets (80 mg total) by mouth daily., Disp: 180 tablet, Rfl: 3   B Complex-C (B-COMPLEX WITH VITAMIN C) tablet, Take 1 tablet by mouth daily., Disp: , Rfl:    Cholecalciferol  (VITAMIN D3) 50 MCG (2000 UT) TABS, Take 1 tablet by mouth daily., Disp: , Rfl:    Difluprednate 0.05 % EMUL, Apply 1 drop to eye. 1 drop into left eye twice daily for 30 days (Patient not taking: Reported on 06/08/2024), Disp: , Rfl:    diphenhydramine -acetaminophen  (TYLENOL  PM) 25-500 MG TABS tablet, Take 1 tablet by mouth at bedtime as needed., Disp: , Rfl:    EPINEPHrine  (EPIPEN  2-PAK) 0.3 mg/0.3 mL IJ SOAJ injection, Inject 0.3 mg into the muscle as needed for anaphylaxis., Disp: 2 each, Rfl: 2   Evolocumab  (REPATHA  SURECLICK) 140 MG/ML SOAJ, Inject 140 mg into the skin every 14 (fourteen)  days., Disp: 2 mL, Rfl: 11   famotidine  (PEPCID ) 40 MG tablet, Take 40 mg by mouth 2 (two) times daily., Disp: , Rfl:    levocetirizine (XYZAL) 5 MG tablet, Take 10 mg by mouth daily., Disp: , Rfl:    losartan (COZAAR) 50 MG tablet, Take 50 mg by mouth daily., Disp: , Rfl:    lubiprostone (AMITIZA) 8 MCG capsule, Take 8 mcg by mouth 2 (two) times daily. (Patient taking differently: Take 8 mcg by mouth daily.), Disp: , Rfl:    montelukast  (SINGULAIR ) 10 MG tablet, Take 1 tablet (10 mg total) by mouth daily., Disp: 90 tablet, Rfl: 1   nitroGLYCERIN  (NITROSTAT ) 0.4 MG SL tablet, Place 1 tablet (0.4 mg total) under the tongue every 5 (five) minutes as needed for chest pain., Disp: 30 tablet, Rfl: 3   oxyCODONE  (OXY IR/ROXICODONE ) 5 MG immediate release tablet, Take 1 tablet (5 mg total) by mouth at bedtime., Disp: 30 tablet, Rfl: 0   RABEprazole (ACIPHEX) 20 MG tablet, Take 20 mg by mouth 2 (two) times daily., Disp: , Rfl:    Secukinumab (COSENTYX SENSOREADY PEN) 150 MG/ML SOAJ, Inject 150 mg into the skin., Disp: , Rfl:    ticagrelor  (BRILINTA ) 90 MG TABS tablet, Take 1 tablet (90 mg total) by mouth 2 (two) times daily., Disp: 180 tablet, Rfl: 3   triamcinolone  cream (KENALOG ) 0.1 %, Apply 1 Application topically 2 (two) times daily., Disp: 30 g, Rfl: 0  Medications ordered in this encounter:  No orders of the defined types were placed in this encounter.    *If you need refills on other medications prior to your next appointment, please contact your pharmacy*  Follow-Up: Call back or seek an in-person evaluation if the symptoms worsen or if the condition fails to improve as anticipated.  [facility] [phone]  Other Instructions Anemia  Anemia is a condition in which there are not enough red blood cells or hemoglobin in the blood. Hemoglobin is a substance in red blood cells that carries oxygen. When you do not have enough red blood cells or hemoglobin (are anemic), your  body cannot get enough oxygen, and your organs may not work properly. As a result, you may feel very tired or have other problems. What are the causes? Common causes of anemia include: Excessive bleeding. Anemia can be caused by excessive bleeding inside or outside the body, including bleeding from the intestines or from heavy menstrual periods in females. Poor nutrition. Long-lasting (chronic) kidney, thyroid , and liver disease. Bone marrow disorders, spleen problems, and blood disorders. Cancer and treatments for cancer. Human immunodeficiency virus (HIV) and acquired immunodeficiency syndrome (AIDS). Infections, medicines, and autoimmune disorders that destroy red blood cells. What are the signs or symptoms? Symptoms of this condition include: Minor weakness. Dizziness. Headache, or difficulties concentrating and sleeping. Heartbeats that feel irregular or faster than normal (palpitations). Shortness of breath, especially with exercise. Pale skin, lips, and nails, or cold hands and feet. Upset stomach (indigestion) and nausea. Symptoms may occur suddenly or develop slowly. If your anemia is mild, you may not have symptoms. How is this diagnosed? This condition is diagnosed based on blood tests, your medical history, and a physical exam. In some cases, a test may be needed in which cells are removed from the soft tissue inside of a bone and looked at under a microscope (bone marrow biopsy). Your health care provider may also check your stool (feces) for blood and may do more testing to look for the cause of your bleeding. Other tests may include: Imaging tests, such as a CT scan or MRI. A procedure to see inside your esophagus and stomach (endoscopy). The esophagus is the part of the body that moves food from your mouth to your stomach. A procedure to see inside your colon and rectum (colonoscopy). How is this treated? Treatment for this condition depends on the cause. If you continue to  lose a lot of blood, you may need to be treated at a hospital. Treatment may include: Taking supplements of iron , vitamin B12, or folic acid . Taking a hormone medicine (erythropoietin) that can help to stimulate red blood cell growth. Receiving donated blood through an IV (blood transfusion). This may be needed if you lose a lot of blood. Making changes to your diet. Having surgery to remove your spleen. Follow these instructions at home: Take over-the-counter and prescription medicines only as told by your health care provider. Take supplements only as told by your health care provider. Follow any diet instructions that you were given by your health care provider. Keep all follow-up visits. Your health care provider will want to recheck your blood tests. Contact a health care provider if: You develop new bleeding anywhere in the body. You are very weak. Get help right away if: You are short of breath. You have pain in your abdomen or chest. You are dizzy or feel faint. You have trouble concentrating. You have bloody stools, black stools,  or tarry stools. You vomit repeatedly or you vomit up blood. These symptoms may be an emergency. Get help right away. Call 911. Do not wait to see if the symptoms will go away. Do not drive yourself to the hospital. Summary Anemia is a condition in which you do not have enough red blood cells or enough of a substance in your red blood cells that carries oxygen. Symptoms may occur suddenly or develop slowly. If your anemia is mild, you may not have symptoms. This condition is diagnosed with blood tests, a medical history, and a physical exam. Other tests may be needed. Treatment for this condition depends on the cause of the anemia. This information is not intended to replace advice given to you by your health care provider. Make sure you discuss any questions you have with your health care provider. Document Revised: 10/19/2021 Document Reviewed:  10/19/2021 Elsevier Patient Education  2024 Elsevier Inc.   If you have been instructed to have an in-person evaluation today at a local Urgent Care facility, please use the link below. It will take you to a list of all of our available [facility], including address, phone number and hours of operation. Please do not delay care.  [facility]  If you or a family member do not have a primary care provider, use the link below to schedule a visit and establish care. When you choose a [facility] primary care physician or advanced practice provider, you gain a long-term partner in health. Find a Primary Care Provider  Learn more about [facility]'s in-office and virtual care options: [facility] - Get Care Now

## 2024-07-06 NOTE — Progress Notes (Signed)
 Virtual Visit Consent   Casey [name], you are scheduled for a virtual visit with a [facility] provider today. Just as with appointments in the office, your consent must be obtained to participate. Your consent will be active for this visit and any virtual visit you may have with one of our providers in the next 365 days. If you have a MyChart account, a copy of this consent can be sent to you electronically.  As this is a virtual visit, video technology does not allow for your provider to perform a traditional examination. This may limit your provider's ability to fully assess your condition. If your provider identifies any concerns that need to be evaluated in person or the need to arrange testing (such as labs, EKG, etc.), we will make arrangements to do so. Although advances in technology are sophisticated, we cannot ensure that it will always work on either your end or our end. If the connection with a video visit is poor, the visit may have to be switched to a telephone visit. With either a video or telephone visit, we are not always able to ensure that we have a secure connection.  By engaging in this virtual visit, you consent to the provision of healthcare and authorize for your insurance to be billed (if applicable) for the services provided during this visit. Depending on your insurance coverage, you may receive a charge related to this service.  I need to obtain your verbal consent now. Are you willing to proceed with your visit today? Casey [name] has provided verbal consent on 07/06/2024 for a virtual visit (video or telephone). Casey [name], [place]  Date: 07/06/2024 4:20 PM   Virtual Visit via Video Note   I, Casey [name], connected with  JENNA ROUTZAHN  ([id number], Jan 01, 1971) on 07/06/24 at  4:15 PM EST by a video-enabled telemedicine application and verified that I am speaking with the correct person using two identifiers.  Location: Patient: Virtual Visit Location Patient:  Home Provider: Virtual Visit Location Provider: Home Office   I discussed the limitations of evaluation and management by telemedicine and the availability of in person appointments. The patient expressed understanding and agreed to proceed.    History of Present Illness: Casey [name] is a 53 y.o. who identifies as a female who was assigned female at birth, and is being seen today for c/o experiencing increased fatigue, headache, shortness of breath.  Pt states these symptoms were on going for like a week. Pt states she is allergic to iron  infusions. Pt states she just realized that urgent care is not the best option.   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   Coronary artery disease s/p PCI/DES to LAD 07/01/2023 12/28/2022   Hyperlipidemia 10/07/2022   Hiatal hernia 10/07/2022   Esophageal candidiasis (HCC) 06/26/2022   Rheumatoid arthritis of multiple sites with 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 factor (HCC) 11/16/2021   De Quervain's tenosynovitis, left 07/31/2021   Status post placement of ureteral stent 02/06/2021   Proteinuria 08/12/2020   Environmental allergies 08/12/2020   History of kidney stones 08/12/2020  Gastroesophageal reflux disease with esophagitis without hemorrhage 08/01/2019   Slow transit constipation 08/01/2019   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   History of colon polyps 06/19/2019   Irregular astigmatism of both eyes 10/11/2018   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   Myopia with  astigmatism and presbyopia 05/07/2016   Overactive bladder 11/18/2015   Incontinence 10/07/2015   Essential hypertension 06/27/2015   Cataract 05/06/2015   Dry eyes 05/06/2015   Crohn's disease (HCC) 04/21/2015   Osteoarthritis of both knees 04/21/2015   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/30/2014   History of abnormal cervical Pap smear 11/29/2014   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   Migraine without aura and without status migrainosus, not intractable 08/28/2014   Migraine 08/28/2014   Seronegative inflammatory arthritis 08/28/2014   Increased urinary frequency 07/16/2013   Dysfunction of both eustachian tubes 08/31/2012   Asthma 10/30/2008    Allergies:  Allergies  Allergen Reactions   Albuterol  Sulfate Shortness Of Breath    Other reaction(s): Other (See Comments)  can't breathe  Other reaction(s): Other (See Comments) can't breathe   Corticosteroids Dermatitis, Palpitations and Swelling    ABLE TO TOLERATE VIA IM AND IV Inhaled - HA    ocular irritation   Silicone Dermatitis and Other (See Comments)    Cast and Bandage Cover  severe skin breakdown Cast and Bandage Cover  Cannot tolerate tegaderm, needs IV 3000. Paper tape.  Rash after several hours of EKG leads, but willing to tolerate.  Can tolerate silk tape for short stretches.  severe skin breakdown  severe skin breakdown Cast and Bandage Cover   Tape     Other reaction(s): Other (See Comments), Other (See Comments), Other (See Comments)  severe skin breakdown  Cast and Bandage Cover  severe skin breakdown  severe skin breakdown   Wound Dressings Dermatitis, Hives and Other (See Comments)    severe skin breakdown Cast and Bandage Cover  severe skin breakdown  severe skin breakdown Cast and Bandage Cover  Cannot tolerate tegaderm, needs IV 3000. Paper tape.  Rash after several hours of EKG leads, but willing to tolerate.  Can tolerate silk tape for short stretches.  Cast and Bandage Cover   Formoterol Other  (See Comments)    Other reaction(s): Headaches  Other reaction(s): Headaches  Other reaction(s): Headaches  Headaches  Other reaction(s): Headaches Other reaction(s): Headaches    Other reaction(s): Headaches    Headaches   Hydrocodone  Rash   Hydrocodone -Acetaminophen  Rash   Nickel Rash and Other (See Comments)   Other Rash    cast and bandage use  cast and bandage use  cast and bandage use  cast and bandage use    cast and bandage use cast and bandage use   Oxycodone -Acetaminophen  Other (See Comments)    headache   Salmeterol Rash   Strawberry Extract Hives    Full body rash from strawberry   Advair Hfa [Fluticasone-Salmeterol]     unknown   Proventil  Hfa [Albuterol ]     unknown   Asa [Aspirin ] Rash   Cleocin [Clindamycin] Rash   Clindamycin/Lincomycin Rash   Fludrocortisone Acetate Rash   Gabapentin Rash   Hydrochlorothiazide Rash   Imipramine Rash   Ipratropium Bromide Rash   Medroxyprogesterone Rash   Meloxicam Rash   Metformin And Related Rash   Nystatin Rash   Penicillins Rash   Pred Forte [Prednisolone] Rash   Prednisone Rash   Solu-Medrol  [Methylprednisolone ] Rash   Sulfa Antibiotics  Rash   Tegaderm Ag Mesh [Silver] Rash   Toradol  [Ketorolac  Tromethamine ] Rash   Tramadol Rash   Venofer  [Iron  Sucrose] Rash   Medications:  Current Outpatient Medications:    acetaminophen  (TYLENOL ) 500 MG tablet, Take 1,000 mg by mouth every 12 (twelve) hours as needed for mild pain or moderate pain., Disp: , Rfl:    albuterol  (PROVENTIL ) (2.5 MG/3ML) 0.083% nebulizer solution, Take 3 mLs (2.5 mg total) by nebulization every 4 (four) hours as needed for wheezing or shortness of breath., Disp: 540 mL, Rfl: 12   aMILoride (MIDAMOR) 5 MG tablet, Take 10 mg by mouth daily., Disp: , Rfl:    atorvastatin  (LIPITOR) 40 MG tablet, Take 2 tablets (80 mg total) by mouth daily., Disp: 180 tablet, Rfl: 3   B Complex-C (B-COMPLEX WITH VITAMIN C) tablet, Take 1 tablet by mouth  daily., Disp: , Rfl:    Cholecalciferol  (VITAMIN D3) 50 MCG (2000 UT) TABS, Take 1 tablet by mouth daily., Disp: , Rfl:    Difluprednate 0.05 % EMUL, Apply 1 drop to eye. 1 drop into left eye twice daily for 30 days (Patient not taking: Reported on 06/08/2024), Disp: , Rfl:    diphenhydramine -acetaminophen  (TYLENOL  PM) 25-500 MG TABS tablet, Take 1 tablet by mouth at bedtime as needed., Disp: , Rfl:    EPINEPHrine  (EPIPEN  2-PAK) 0.3 mg/0.3 mL IJ SOAJ injection, Inject 0.3 mg into the muscle as needed for anaphylaxis., Disp: 2 each, Rfl: 2   Evolocumab  (REPATHA  SURECLICK) 140 MG/ML SOAJ, Inject 140 mg into the skin every 14 (fourteen) days., Disp: 2 mL, Rfl: 11   famotidine  (PEPCID ) 40 MG tablet, Take 40 mg by mouth 2 (two) times daily., Disp: , Rfl:    levocetirizine (XYZAL) 5 MG tablet, Take 10 mg by mouth daily., Disp: , Rfl:    losartan (COZAAR) 50 MG tablet, Take 50 mg by mouth daily., Disp: , Rfl:    lubiprostone (AMITIZA) 8 MCG capsule, Take 8 mcg by mouth 2 (two) times daily. (Patient taking differently: Take 8 mcg by mouth daily.), Disp: , Rfl:    montelukast  (SINGULAIR ) 10 MG tablet, Take 1 tablet (10 mg total) by mouth daily., Disp: 90 tablet, Rfl: 1   nitroGLYCERIN  (NITROSTAT ) 0.4 MG SL tablet, Place 1 tablet (0.4 mg total) under the tongue every 5 (five) minutes as needed for chest pain., Disp: 30 tablet, Rfl: 3   oxyCODONE  (OXY IR/ROXICODONE ) 5 MG immediate release tablet, Take 1 tablet (5 mg total) by mouth at bedtime., Disp: 30 tablet, Rfl: 0   RABEprazole (ACIPHEX) 20 MG tablet, Take 20 mg by mouth 2 (two) times daily., Disp: , Rfl:    Secukinumab (COSENTYX SENSOREADY PEN) 150 MG/ML SOAJ, Inject 150 mg into the skin., Disp: , Rfl:    ticagrelor  (BRILINTA ) 90 MG TABS tablet, Take 1 tablet (90 mg total) by mouth 2 (two) times daily., Disp: 180 tablet, Rfl: 3   triamcinolone  cream (KENALOG ) 0.1 %, Apply 1 Application topically 2 (two) times daily., Disp: 30 g, Rfl:  0  Observations/Objective: Patient is well-developed, well-nourished in no acute distress.  Resting comfortably at home.  Head is normocephalic, atraumatic.  No labored breathing.  Speech is clear and coherent with logical content.  Patient is alert and oriented at baseline.    Assessment and Plan: 1. Iron  deficiency anemia, unspecified iron  deficiency anemia type (Primary)  2. Other fatigue  -Pt was advised to proceed to the emergency room hospital for further evaluation of symptoms  Follow Up Instructions:  I discussed the assessment and treatment plan with the patient. The patient was provided an opportunity to ask questions and all were answered. The patient agreed with the plan and demonstrated an understanding of the instructions.  A copy of instructions were sent to the patient via MyChart unless otherwise noted below.    The patient was advised to call back or seek an in-person evaluation if the symptoms worsen or if the condition fails to improve as anticipated.    Casey Mater, PA-C

## 2024-07-09 ENCOUNTER — Telehealth: Payer: Self-pay

## 2024-07-09 NOTE — Telephone Encounter (Signed)
 Pt called to report All weekend long, I have had waves of extreme fatigue. Like I went to do a little Christmas shopping, and I had to pull over for 20 minutes. I'm taking Pepcid  and tylenol . I've had a little vomiting and some nausea. No nausea today. My appetite isn't good. My husband told me to call y'all to figure out what is wrong with me. She denies fevers, admits to chills @ times. Her headaches change locations per pt. Sometimes it is in top of her head, others @ back of head, near neck. Pt states she called urgent care on Friday, and they recommended she go to the ER. Pt states, I wasn't going to ER for a blood draw. I asked pt what blood draw she is talking about. She replied, to check my iron . I asked pt if her symptoms started before or after her iron  infusions? She stated, I had them before, during and after the iron . She has f/u appt here on 07/24/2024 for repeat lab draw and f/u with Melissa. Message sent to [facility] (who will get the message tomorrow in clinic) and Devere Afton LIED.   Devere Phy,RPH: Last iron  given was 11/12 so this is highly unlikely to be related to the iron  infusion. If you repeat the iron  studies too soon, you will get a false reading so she should wait until 12/16 to get repeat studies. She could have a viral illness unrelated to her iron . It may be better for her to see her PCP about these symptoms if she can't wait until 12/16.   Melissa,NP: I don't think it's anything related to our management. I agree she needs to see PCP.

## 2024-07-10 ENCOUNTER — Ambulatory Visit (HOSPITAL_BASED_OUTPATIENT_CLINIC_OR_DEPARTMENT_OTHER): Admitting: Student

## 2024-07-10 ENCOUNTER — Encounter: Payer: Self-pay | Admitting: Hematology and Oncology

## 2024-07-10 VITALS — BP 132/74 | HR 80 | Temp 98.4°F | Resp 16 | Ht 63.0 in | Wt 219.2 lb

## 2024-07-10 DIAGNOSIS — D509 Iron deficiency anemia, unspecified: Secondary | ICD-10-CM | POA: Diagnosis not present

## 2024-07-10 DIAGNOSIS — E538 Deficiency of other specified B group vitamins: Secondary | ICD-10-CM

## 2024-07-10 DIAGNOSIS — M0609 Rheumatoid arthritis without rheumatoid factor, multiple sites: Secondary | ICD-10-CM

## 2024-07-10 DIAGNOSIS — R5383 Other fatigue: Secondary | ICD-10-CM

## 2024-07-10 DIAGNOSIS — Z780 Asymptomatic menopausal state: Secondary | ICD-10-CM

## 2024-07-10 DIAGNOSIS — G4734 Idiopathic sleep related nonobstructive alveolar hypoventilation: Secondary | ICD-10-CM

## 2024-07-10 DIAGNOSIS — R6883 Chills (without fever): Secondary | ICD-10-CM

## 2024-07-10 DIAGNOSIS — K581 Irritable bowel syndrome with constipation: Secondary | ICD-10-CM | POA: Insufficient documentation

## 2024-07-10 NOTE — Patient Instructions (Signed)
 It was nice to see you today!  If you have any problems before your next visit feel free to message me via MyChart (minor issues or questions) or call the office, otherwise you may reach out to schedule an office visit.  Thank you! Pau Banh, PA-C

## 2024-07-10 NOTE — Progress Notes (Signed)
 Established Patient Office Visit  Subjective   Patient ID: Casey [name], female    DOB: [birth year]  Age: 53 y.o. MRN: [id number]  Chief Complaint  Patient presents with   Fatigue    Fatigue level is through the roof hemologist and pharmacy say not related to iron  infusion and not to do an iron  draw as could be false reading due last infusion was 06/20/2024.  Cannot handle this anymore. Answer needs to be found. Feels like she gets up and wants to go back to bed. Is allergic to the iron  infusions she was getting. Was falling asleep driving back from [place]. Has had some chills and threw up Saturday and Sunday. Having some confusion.     HPI  Discussed the use of AI scribe software for clinical note transcription with the patient, who gave verbal consent to proceed.  History of Present Illness   Casey [name] is a 53 year old female with iron deficiency anemia and rheumatoid arthritis who presents with severe fatigue and memory issues.  She experiences severe fatigue and memory issues, progressively worsening over time. The fatigue was present before starting iron infusions but has intensified recently, impacting her daily activities and work. She feels exhausted to the point of confusion and memory loss.  She has a history of iron deficiency anemia and has been receiving iron infusions. After the fourth infusion, she developed an allergic reaction with itching, fatigue, and a massive headache, leading to the cancellation of the fifth infusion. Her hematology team advised against checking iron levels currently due to potential false results from the recent infusion. She is scheduled to see her on December 16th.  She reports having a severe headache and vomiting once- though this is not extremely abnormal for her. No significant changes in blood pressure or pulse oximetry readings at home, and her temperature was normal during the visit. She experiences occasional chills, mostly at  night.  Her sleep is disrupted, often due to needing to urinate or her dog needing to go out. She typically gets 8 to 10 hours of sleep but feels fatigued shortly after waking. She has been assessed for sleep apnea in the past, with no diagnosis, but her oxygen levels were noted to drop too low. She is scheduled for an in-lab sleep study in early February.  She has a history of low B12 levels and has taken B12 supplements in the past, including a B complex and liquid B12- not on a b12 supplement currently.   She has rheumatoid arthritis, causing constant body aches, making it difficult to discern changes in pain levels. She reports increased night sweats, which she attributes to postmenopausal symptoms.  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 She is on medication for this condition. She has a history of candidiasis in her throat, which has been recurrent, but she has not noticed any recent symptoms.  She mentions a history of elevated white blood cell counts during RA flares but does not recall recent discussions about this with her hematologist. She is concerned about the possibility of her bone marrow not producing new red blood cells, given her low iron levels in the past.         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   Coronary artery disease s/p  PCI/DES to LAD 07/01/2023 12/28/2022   Hyperlipidemia 10/07/2022   Hiatal hernia 10/07/2022   Esophageal candidiasis (HCC) 06/26/2022   Rheumatoid arthritis of multiple sites with 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 factor (HCC) 11/16/2021   De Quervain's tenosynovitis, left 07/31/2021   Status post placement of ureteral stent 02/06/2021   Proteinuria 08/12/2020   Environmental allergies 08/12/2020   History of kidney stones 08/12/2020   Gastroesophageal reflux disease with esophagitis without hemorrhage 08/01/2019   Slow transit constipation 08/01/2019   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   History of colon polyps 06/19/2019   Irregular astigmatism of both eyes 10/11/2018   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   Myopia with astigmatism and presbyopia 05/07/2016   Overactive bladder 11/18/2015   Incontinence 10/07/2015   Essential hypertension 06/27/2015   Cataract 05/06/2015   Dry eyes 05/06/2015   Crohn's disease (HCC) 04/21/2015   Osteoarthritis of both knees 04/21/2015   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/30/2014   History of abnormal cervical Pap smear 11/29/2014   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 Atopic dermatitis Formatting of this note might be different from the original. Formatting of this note might be different from  the original. Atopic dermatitis Formatting of this note might be different from the original. Formatting of this note might be different from the original. Formatting of this note might be different from the or   Cataract 05/06/2015   Contusion of left wrist 07/08/2021   Coronary artery disease s/p PCI/DES to LAD 07/01/2023 12/28/2022   Crohn's disease (HCC) 04/21/2015   De Quervain's tenosynovitis, left 07/31/2021   Diabetes mellitus without complication (HCC)    Diabetes type 2, controlled (HCC) 04/21/2015   Dry eyes 05/06/2015   DVT (deep venous thrombosis) (HCC)    Dysfunction of both eustachian tubes 08/31/2012   Encounter for monitoring immunomodulating therapy 11/20/2021   Environmental allergies 08/12/2020   Esophageal candidiasis (HCC) 06/26/2022   Essential hypertension 06/27/2015   Fibrositis 08/28/2014   Formatting of this note might be different from the original. Fibromyalgia Formatting of this note might be different from the original. Formatting of this note might be different from the original. Fibromyalgia   Flank pain 11/24/2020   Gastroesophageal reflux disease with esophagitis without hemorrhage 08/01/2019   Formatting of this note might be different from the original. Formatting of this note might be different from the original. 07/2019: chronic symptoms of esophageal reflux since prior to sleeve gastrectomy  surgery. Symptoms have worsened over the last 6 months with recent EGD findings of grade B esophagitis, irregular z line, erythematous mucosa, and evidence of sleeve gastrectomy. 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 Formatting of this note might be different from the original. 07/2019: 8 mm colonic polyp removed  during colonoscopy, biopsy showed serrated polyp. Repeat colonoscopy recommended in one year. Formatting of this note might be different from the original. 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 Hypertension Formatting of this note might be different from the original. Formatting of this note might be different from the original. 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 Keratoconus Formatting of this note might be different from the original. Formatting of this note might be different from the original. Keratoconus Formatting of this note might be different from the original. Formatting of this note might be different from the original. Keratoconus Formatting of this note might be different from the or   Microhematuria 06/27/2015   Migraine 08/28/2014   Formatting of this note might be different from the original. Migraine Formatting of this note might be different from the original. Formatting of this note might be different from the original. Migraine Formatting of this note might be different from the original. Migraine Formatting of this note might be different from the original. Migraine Formatting of this note might be different from the or   Migraine without aura and without status migrainosus, not intractable 08/28/2014   Formatting of this note might be different from the original. Formatting of this note might be different from the original. Formatting of this note might be different from the original. Migraine  Formatting of this note might be different from the original. Migraine Formatting of this note might be different from the original. Formatting of this note might be different from the original. Migraine F   Mild intermittent asthma without complication 10/30/2008   Formatting of this note might be different from the original. Formatting of this note might be different from the original. Formatting of this note might be different from the original. Asthma Formatting of this note might be different from the original. Asthma Formatting of this note might be different from the original. Formatting of this note might be different from the original. 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 Formatting of this note might be different from the original. 01/25/2016 with Dr. Loney Pol at [facility] [place] of this note might be different from the original. 01/25/2016 with Dr. Loney Pol at [facility] [place] of this note might be different from the original. Formatting of this note might be different from    Seronegative inflammatory arthritis 08/28/2014   Formatting of this note might be different from the original. Formatting of this note might be different from the original. Arthritis; diagnosed as IBD related - 2012 Formatting  of this note might be different from the original. Followed by rheumatologist. Had to change providers due to an insurance change earlier this year resulting in patient not being able to have Remicade  for ~6 months. 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 Formatting of this note might be different from the original. Treated with PRN miralax . Recent colonoscopy 07/2019. Formatting of this note might be different from the original. Treated with PRN miralax . Recent colonoscopy 07/2019. Formatting of this note might be different from the original. Formatting of this note might be different f   Status post placement of ureteral stent 02/06/2021   Tooth infection 08/12/2020   Unstable angina (HCC) 07/29/2023   Ureteral stone 01/20/2022   Urinary tract infection, site not specified 06/26/2022   Social History   Tobacco Use   Smoking status: Never    Passive exposure: Never   Smokeless tobacco: Never  Vaping Use   Vaping status: Never Used  Substance Use Topics   Alcohol use: Not Currently    Comment: rare   Drug use: Never   Allergies  Allergen Reactions   Albuterol  Sulfate Shortness Of Breath    Other reaction(s): Other (See Comments)  can't breathe  Other reaction(s): Other (See Comments) can't breathe   Corticosteroids Dermatitis, Palpitations and Swelling    ABLE TO TOLERATE VIA IM AND IV Inhaled - HA    ocular irritation   Silicone Dermatitis and Other (See Comments)    Cast and Bandage Cover  severe skin breakdown Cast and Bandage Cover  Cannot tolerate tegaderm, needs IV 3000. Paper tape.  Rash after several hours of EKG leads, but willing to tolerate.  Can tolerate silk tape for short stretches.  severe skin breakdown  severe skin breakdown Cast and Bandage Cover   Tape     Other reaction(s): Other (See Comments), Other (See Comments), Other (See Comments)  severe skin  breakdown  Cast and Bandage Cover  severe skin breakdown  severe skin breakdown   Wound Dressings Dermatitis, Hives and Other (See Comments)    severe skin breakdown Cast and Bandage Cover  severe skin breakdown  severe skin breakdown Cast and Bandage Cover  Cannot tolerate tegaderm, needs IV 3000. Paper tape.  Rash after several hours of EKG leads, but willing to tolerate.  Can tolerate silk tape for short stretches.  Cast and Bandage Cover   Formoterol Other (See Comments)    Other reaction(s): Headaches  Other reaction(s): Headaches  Other reaction(s): Headaches  Headaches  Other reaction(s): Headaches Other reaction(s): Headaches    Other reaction(s): Headaches    Headaches   Hydrocodone  Rash   Hydrocodone -Acetaminophen  Rash   Nickel Rash and Other (See Comments)   Other Rash    cast and bandage use  cast and bandage use  cast and bandage use  cast and bandage use    cast and bandage use cast and bandage use   Oxycodone -Acetaminophen  Other (See Comments)    headache   Salmeterol Rash   Strawberry Extract Hives    Full body rash from strawberry   Advair Hfa [Fluticasone-Salmeterol]     unknown   Proventil  Hfa [Albuterol ]     unknown   Asa [Aspirin ] Rash   Cleocin [Clindamycin] Rash   Clindamycin/Lincomycin Rash   Fludrocortisone Acetate Rash   Gabapentin Rash   Hydrochlorothiazide Rash   Imipramine Rash   Ipratropium Bromide Rash   Medroxyprogesterone Rash   Meloxicam Rash   Metformin And Related Rash   Nystatin Rash   Penicillins Rash   Pred Forte [Prednisolone] Rash   Prednisone Rash   Solu-Medrol  [Methylprednisolone ] Rash   Sulfa Antibiotics Rash   Tegaderm Ag Mesh [Silver] Rash   Toradol  [Ketorolac  Tromethamine ] Rash   Tramadol Rash   Venofer  [Iron  Sucrose] Rash      ROS Per HPI.    Objective:     BP 132/74   Pulse 80   Temp 98.4 F (36.9 C) (Oral)   Resp 16   Ht 5' 3 (1.6 m)   Wt 219 lb 3.2 oz (99.4 kg)   SpO2 97%   BMI 38.83  kg/m  BP Readings from Last 3 Encounters:  07/10/24 132/74  06/26/24 138/70  06/20/24 135/73   Wt Readings from Last 3 Encounters:  07/10/24 219 lb 3.2 oz (99.4 kg)  06/08/24 212 lb 3.2 oz (96.3 kg)  05/08/24 207 lb 9.6 oz (94.2 kg)   SpO2 Readings from Last 3 Encounters:  07/10/24 97%  06/26/24 100%  06/20/24 100%      Physical Exam Constitutional:      General: She is not in acute distress.    Appearance: Normal appearance. She is not ill-appearing.  HENT:     Head: Normocephalic and atraumatic.     Nose: Nose normal.  Eyes:     General: No scleral icterus.    Conjunctiva/sclera: Conjunctivae normal.  Neck:     Comments: Slight tenderness to left side of neck on palpation. Mild lymphadenopathy. Cardiovascular:     Rate and Rhythm: Normal rate and regular rhythm.     Heart sounds: Normal heart sounds. No murmur heard.    No friction rub.  Pulmonary:     Effort: Pulmonary effort is normal. No respiratory distress.     Breath sounds: Normal breath sounds. No wheezing, rhonchi or rales.  Musculoskeletal:     Cervical back: No tenderness.  Skin:    General: Skin is warm and dry.     Coloration: Skin is not jaundiced or pale.  Neurological:     General: No focal deficit present.     Mental Status: She is alert.  Psychiatric:        Mood and Affect: Mood normal.        Behavior: Behavior normal.      Results for orders placed or performed in visit on 07/10/24  [facility] HIV SCREENING LAB  Result Value Ref Range   HM HIV Screening Negative - Validated   HM HEPATITIS C SCREENING LAB  Result Value Ref Range   HM Hepatitis Screen Negative-Validated   CBC with Differential/Platelet  Result Value Ref Range   WBC 11.0 (H) 3.4 - 10.8 x10E3/uL   RBC 5.16 3.77 - 5.28 x10E6/uL   Hemoglobin 13.3 11.1 - 15.9 g/dL  Hematocrit 42.5 34.0 - 46.6 %   MCV 82 79 - 97 fL   MCH 25.8 (L) 26.6 - 33.0 pg   MCHC 31.3 (L) 31.5 - 35.7 g/dL   RDW 77.4 (H) 88.2 - 84.5 %   Platelets 340  150 - 450 x10E3/uL   Neutrophils 60 Not Estab. %   Lymphs 30 Not Estab. %   Monocytes 7 Not Estab. %   Eos 1 Not Estab. %   Basos 1 Not Estab. %   Neutrophils Absolute 6.7 1.4 - 7.0 x10E3/uL   Lymphocytes Absolute 3.3 (H) 0.7 - 3.1 x10E3/uL   Monocytes Absolute 0.8 0.1 - 0.9 x10E3/uL   EOS (ABSOLUTE) 0.1 0.0 - 0.4 x10E3/uL   Basophils Absolute 0.1 0.0 - 0.2 x10E3/uL   Immature Granulocytes 1 Not Estab. %   Immature Grans (Abs) 0.1 0.0 - 0.1 x10E3/uL   Hematology Comments: Note:   HM COLONOSCOPY  Result Value Ref Range   HM Colonoscopy See Report (in chart) See Report (in chart), Patient Reported    Last CBC Lab Results  Component Value Date   WBC 11.0 (H) 07/10/2024   HGB 13.3 07/10/2024   HCT 42.5 07/10/2024   MCV 82 07/10/2024   MCH 25.8 (L) 07/10/2024   RDW 22.5 (H) 07/10/2024   PLT 340 07/10/2024   Last metabolic panel Lab Results  Component Value Date   GLUCOSE 96 06/08/2024   NA 139 06/08/2024   K 3.9 06/08/2024   CL 104 06/08/2024   CO2 24 06/08/2024   BUN 14 06/08/2024   CREATININE 0.91 06/08/2024   GFRNONAA >60 06/08/2024   CALCIUM  9.6 06/08/2024   PROT 8.0 06/08/2024   ALBUMIN 4.1 06/08/2024   LABGLOB 4.3 (H) 06/08/2024   BILITOT 0.4 06/08/2024   ALKPHOS 110 06/08/2024   AST 17 06/08/2024   ALT 14 06/08/2024   ANIONGAP 11 06/08/2024   Last lipids Lab Results  Component Value Date   CHOL 249 (H) 09/06/2023   HDL 73 09/06/2023   LDLCALC 140 (H) 09/06/2023   TRIG 205 (H) 09/06/2023   CHOLHDL 3.4 09/06/2023   Last hemoglobin A1c Lab Results  Component Value Date   HGBA1C 6.1 (H) 05/08/2024      The 10-year ASCVD risk score (Arnett DK, et al., 2019) is: 5.2%    Assessment & Plan:   Assessment and Plan    Fatigue and chills under evaluation Persistent fatigue and chills with differential including viral illness, iron  deficiency anemia, and sleep-related hypoxemia. Hematology does not believe fatigue is iron -related. Possible viral  etiology considered. Sleep study scheduled for early February to evaluate for hypoxemia. Estrogen therapy not recommended due to clot risk although postmenopausal estrogen def also likely plays a part. All in all this is almost certainly multifactorial- History of RA, sleep related hypoxemia, postmenopausal, IDA, History of gastric surgery, IBS with  possible pain related fatigue, etc. In such a complex patient, it would be hard to pinpoint a single etiology causing fatigue. - Ordered CBC to evaluate white blood cell count and rule out infection - Proceed with scheduled sleep study in early February to assess for hypoxemia - Consider liquid B12 supplementation, 2000 mcg per day due to borderline levels  Iron  deficiency anemia status post infusion Iron  deficiency anemia with previous iron  infusions. Allergic reaction to iron  infusion after fourth dose. Hematology advised against iron  blood work due to recent infusion. Awaiting follow-up with hematology on December 16th. Concerns about bone marrow production of red cells due to  low iron  levels. - Await follow-up with hematology on December 16th  Vitamin B12 slightly low Low normal B12 levels. Previous intolerance to pill form of B12. Liquid B12 supplementation recommended. - Start liquid B12 supplementation, 2000 mcg per day  Sleep-related hypoxemia Previous inconclusive sleep studies. Scheduled for a new sleep study in early February to evaluate for hypoxemia. Previous studies showed low oxygen levels at night. - Proceed with scheduled sleep study in early February to assess for hypoxemia  Rheumatoid arthritis Chronic rheumatoid arthritis with constant body aches. No recent changes in symptoms. Previous elevated white blood cell count noted during RA flares, but current count not discussed. - Continue current management for rheumatoid arthritis  Irritable bowel syndrome with constipation Chronic irritable bowel syndrome with constipation.  Experiences cramping and abdominal pain, especially before bowel movements. Scheduled for colonoscopy in February. - Continue current management for IBS - Proceed with scheduled colonoscopy in February  Postmenopausal state Postmenopausal state with associated night sweats and fatigue. Estrogen therapy not recommended due to clot risk. - Continue to monitor symptoms and manage conservatively      I personally spent a total of 30 minutes in the care of the patient today including preparing to see the patient, getting/reviewing separately obtained history, performing a medically appropriate exam/evaluation, counseling and educating, placing orders, and documenting clinical information in the EHR.   Return if symptoms worsen or fail to improve.    Egan Berkheimer T Kirsta Probert, PA-C

## 2024-07-11 ENCOUNTER — Ambulatory Visit: Admitting: Podiatry

## 2024-07-11 ENCOUNTER — Encounter: Payer: Self-pay | Admitting: Hematology and Oncology

## 2024-07-11 ENCOUNTER — Encounter (HOSPITAL_BASED_OUTPATIENT_CLINIC_OR_DEPARTMENT_OTHER): Payer: Self-pay

## 2024-07-11 DIAGNOSIS — L97401 Non-pressure chronic ulcer of unspecified heel and midfoot limited to breakdown of skin: Secondary | ICD-10-CM

## 2024-07-11 MED ORDER — MUPIROCIN 2 % EX OINT: 1.0000 | TOPICAL_OINTMENT | Freq: Two times a day (BID) | CUTANEOUS | 2 refills | Status: AC

## 2024-07-11 NOTE — Progress Notes (Signed)
 [facility] CONSULT NOTE  Patient Care Team: Rothfuss, Jacob T, PA-C as PCP - General (Physician Assistant) Michele Richardson, DO as PCP - Cardiology (Cardiology) Harl Eleanor LABOR, NP as Nurse Practitioner (Hematology and Oncology)  ASSESSMENT & PLAN:  Anemia: Worsening symptoms of anemia including shortness of breath and fatigue. CBC reveals hemoglobin 11.1 with recent iron  saturation 6 and ferritin 7. We will plan for IV iron  having discussed potential side effects including infusion reactions. We will proceed with IV iron  in the form of Venofer  and have her return 4 weeks after last infusion for repeat evaluation.   She presents today with increasing nausea and inability to tolerate fluids. We will plan for IVF with antinausea meds today.    All questions were answered. The patient knows to call the clinic with any problems, questions or concerns.  The total time spent in the appointment was 45 minutes encounter with patients including review of chart and various tests results, discussions about plan of care and coordination of care plan  Eleanor LABOR Harl, NP 06/14/2024   CHIEF COMPLAINTS/PURPOSE OF CONSULTATION:  Anemia  HISTORY OF PRESENTING ILLNESS:  Casey [name] 53 y.o. female is here because of anemia  She was found to have abnormal CBC from 10/31 She denies recent chest pain on exertion, pre-syncopal episodes, or palpitations. She complains of shortness of breath and fatigue. She had not noticed any recent bleeding such as epistaxis, hematuria or hematochezia The patient denies over the counter NSAID ingestion. She is not  on antiplatelets agents. Her last colonoscopy was n/a She had no prior history or diagnosis of cancer. Her age appropriate screening programs are up-to-date. She denies any pica and eats a variety of diet. She never donated blood or received blood transfusion The patient was not prescribed oral iron  supplements  MEDICAL HISTORY:  Past  Medical History:  Diagnosis Date   Albuminuria 06/27/2015   Anxiety 11/04/2014   Arm DVT (deep venous thromboembolism), acute, left (HCC) 08/17/2022   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/30/2014   Asthma    Atopic dermatitis 10/30/2008   Formatting of this note might be different from the original. Atopic dermatitis Formatting of this note might be different from the original. Formatting of this note might be different from the original. Atopic dermatitis Formatting of this note might be different from the original. Formatting of this note might be different from the original. Formatting of this note might be different from the or   Cataract 05/06/2015   Contusion of left wrist 07/08/2021   Coronary artery disease s/p PCI/DES to LAD 07/01/2023 12/28/2022   Crohn's disease (HCC) 04/21/2015   De Quervain's tenosynovitis, left 07/31/2021   Diabetes mellitus without complication (HCC)    Diabetes type 2, controlled (HCC) 04/21/2015   Dry eyes 05/06/2015   DVT (deep venous thrombosis) (HCC)    Dysfunction of both eustachian tubes 08/31/2012   Encounter for monitoring immunomodulating therapy 11/20/2021   Environmental allergies 08/12/2020   Esophageal candidiasis (HCC) 06/26/2022   Essential hypertension 06/27/2015   Fibrositis 08/28/2014   Formatting of this note might be different from the original. Fibromyalgia Formatting of this note might be different from the original. Formatting of this note might be different from the original. Fibromyalgia   Flank pain 11/24/2020   Gastroesophageal reflux disease with esophagitis without hemorrhage 08/01/2019   Formatting of this note might be different from the original. Formatting of this note might be different from the original. 07/2019: chronic symptoms  of esophageal reflux since prior to sleeve gastrectomy surgery. Symptoms have worsened over the last 6 months with recent EGD findings of grade B esophagitis, irregular z line,  erythematous mucosa, and evidence of sleeve gastrectomy. 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 Formatting of this note might be different from the original. 07/2019: 8 mm colonic polyp removed during colonoscopy, biopsy showed serrated polyp. Repeat colonoscopy recommended in one year. Formatting of this note might be different from the original. Added automatically from request for surgery [phone] Formatting of this note might b   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   History of kidney stones 08/12/2020   Hypercholesterolemia 10/07/2022   Hypertension, essential, benign 06/27/2015   Hypertensive disorder 08/28/2014   Formatting of this note might be different from the original. Hypertension Formatting of this note might be different from the original. Formatting of this note might be different from the original. 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 Keratoconus Formatting of this note might be different from the original. Formatting of this note might be different from the original. Keratoconus Formatting of this note might be different from the original. Formatting of this note might be different from the original. Keratoconus Formatting of this note might be different from the or   Microhematuria 06/27/2015   Migraine 08/28/2014   Formatting of this note might be different from the original. Migraine Formatting of this note might be different from the original. Formatting of this note might be different from the original. Migraine Formatting of this note might be  different from the original. Migraine Formatting of this note might be different from the original. Migraine Formatting of this note might be different from the or   Migraine without aura and without status migrainosus, not intractable 08/28/2014   Formatting of this note might be different from the original. Formatting of this note might be different from the original. Formatting of this note might be different from the original. Migraine Formatting of this note might be different from the original. Migraine Formatting of this note might be different from the original. Formatting of this note might be different from the original. Migraine F   Mild intermittent asthma without complication 10/30/2008   Formatting of this note might be different from the original. Formatting of this note might be different from the original. Formatting of this note might be different from the original. Asthma Formatting of this note might be different from the original. Asthma Formatting of this note might be different from the original. Formatting of this note might be different from the original. 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 Formatting of this note might be different from the original. 01/25/2016 with Dr. Loney Pol at [facility]. [place]  of this note might be different from the original. 01/25/2016 with Dr. Loney Pol at [facility]. [place] of this note might be different from the original. Formatting of this note might be different from    Seronegative inflammatory arthritis 08/28/2014   Formatting of this note might be different from the original. Formatting of this note might be different from the original. Arthritis; diagnosed as IBD related - 2012 Formatting of this note might be different from the original. Followed by rheumatologist. Had to change providers due to an insurance change earlier this year resulting in patient not being able to have Remicade  for ~6 months. 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 Formatting of this note might be different from the original. Treated with PRN miralax . Recent colonoscopy 07/2019. Formatting of this note might be different from the original. Treated with PRN miralax . Recent colonoscopy 07/2019. Formatting of this note might be different from the original. Formatting of this note might be different f   Status post placement of ureteral stent 02/06/2021   Tooth infection 08/12/2020   Unstable angina (HCC) 07/29/2023   Ureteral stone 01/20/2022   Urinary tract infection, site not specified 06/26/2022    SURGICAL HISTORY: Past Surgical History:  Procedure Laterality Date   BACK SURGERY     L5-S1   CATARACT EXTRACTION Left    CERVICAL CONE BIOPSY  04/2000   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   CORONARY STENT INTERVENTION N/A 07/01/2023   Procedure: CORONARY STENT INTERVENTION;  Surgeon: Darron Deatrice LABOR, MD;  Location: [facility];  Service: Cardiovascular;  Laterality: N/A;   EYE SURGERY     GASTRIC BYPASS  2017   [place]   LAPAROSCOPIC GASTRIC SLEEVE RESECTION     LEFT HEART CATH AND CORONARY ANGIOGRAPHY N/A 07/01/2023   Procedure: LEFT HEART CATH AND  CORONARY ANGIOGRAPHY;  Surgeon: Darron Deatrice LABOR, MD;  Location: [facility];  Service: Cardiovascular;  Laterality: N/A;   LEFT HEART CATH AND CORONARY ANGIOGRAPHY N/A 08/01/2023   Procedure: LEFT HEART CATH AND CORONARY ANGIOGRAPHY;  Surgeon: Ladona Heinz, MD;  Location: [facility];  Service: Cardiovascular;  Laterality: N/A;   Low back disc surgery     06/2000   TOTAL KNEE ARTHROPLASTY Right 11/2016    SOCIAL HISTORY: Social History   Socioeconomic History   Marital status: Married    Spouse name: Not on file   Number of children: 0   Years of education: Not on file   Highest education level: Professional school degree (e.g., MD, DDS, DVM, JD)  Occupational History   Not on file  Tobacco Use   Smoking status: Never    Passive exposure: Never   Smokeless tobacco: Never  Vaping Use   Vaping status: Never Used  Substance and Sexual Activity   Alcohol use: Not Currently    Comment: rare   Drug use: Never   Sexual activity: Not Currently  Other Topics Concern   Not on file  Social History Narrative   2 dogs    Social Drivers of Health   Financial Resource Strain: Medium Risk (07/10/2024)  Overall Financial Resource Strain (CARDIA)    Difficulty of Paying Living Expenses: Somewhat hard  Food Insecurity: Food Insecurity Present (07/10/2024)   Hunger Vital Sign    Worried About Running Out of Food in the Last Year: Sometimes true    Ran Out of Food in the Last Year: Never true  Transportation Needs: No Transportation Needs (07/10/2024)   PRAPARE - Administrator, Civil Service (Medical): No    Lack of Transportation (Non-Medical): No  Physical Activity: Inactive (07/10/2024)   Exercise Vital Sign    Days of Exercise per Week: 0 days    Minutes of Exercise per Session: Not on file  Stress: No Stress Concern Present (07/10/2024)   [employer] of Occupational Health - Occupational Stress Questionnaire    Feeling of Stress: Not at all  Social  Connections: Moderately Isolated (07/10/2024)   Social Connection and Isolation Panel    Frequency of Communication with Friends and Family: More than three times a week    Frequency of Social Gatherings with Friends and Family: Once a week    Attends Religious Services: Never    Database Administrator or Organizations: No    Attends Engineer, Structural: Not on file    Marital Status: Married  Catering Manager Violence: Not At Risk (06/08/2024)   Humiliation, Afraid, Rape, and Kick questionnaire    Fear of Current or Ex-Partner: No    Emotionally Abused: No    Physically Abused: No    Sexually Abused: No    FAMILY HISTORY: Family History  Adopted: Yes  Problem Relation Age of Onset   Diabetes Mother    Hypertension Mother    Diabetes Father    Hypertension Father    Hypertension Sister    Diabetes Sister    Hypertension Brother    Diabetes Brother    Diabetes Paternal Grandmother    Cancer Other    Asthma Neg Hx    Allergic rhinitis Neg Hx    Atopy Neg Hx    Eczema Neg Hx     ALLERGIES:  is allergic to albuterol  sulfate, corticosteroids, silicone, tape, wound dressings, formoterol, hydrocodone , hydrocodone -acetaminophen , nickel, other, oxycodone -acetaminophen , salmeterol, strawberry extract, advair hfa [fluticasone-salmeterol], proventil  hfa [albuterol ], asa [aspirin ], cleocin [clindamycin], clindamycin/lincomycin, fludrocortisone acetate, gabapentin, hydrochlorothiazide, imipramine, ipratropium bromide, medroxyprogesterone, meloxicam, metformin and related, nystatin, penicillins, pred forte [prednisolone], prednisone, solu-medrol  [methylprednisolone ], sulfa antibiotics, tegaderm ag mesh [silver], toradol  [ketorolac  tromethamine ], tramadol, and venofer  [iron  sucrose].  MEDICATIONS:  Current Outpatient Medications  Medication Sig Dispense Refill   acetaminophen  (TYLENOL ) 500 MG tablet Take 1,000 mg by mouth every 12 (twelve) hours as needed for mild pain or moderate  pain.     albuterol  (PROVENTIL ) (2.5 MG/3ML) 0.083% nebulizer solution Take 3 mLs (2.5 mg total) by nebulization every 4 (four) hours as needed for wheezing or shortness of breath. 540 mL 12   aMILoride (MIDAMOR) 5 MG tablet Take 10 mg by mouth daily.     atorvastatin  (LIPITOR) 40 MG tablet Take 2 tablets (80 mg total) by mouth daily. 180 tablet 3   B Complex-C (B-COMPLEX WITH VITAMIN C) tablet Take 1 tablet by mouth daily.     Cholecalciferol  (VITAMIN D3) 50 MCG (2000 UT) TABS Take 1 tablet by mouth daily.     Difluprednate 0.05 % EMUL Apply 1 drop to eye every other day.     diphenhydramine -acetaminophen  (TYLENOL  PM) 25-500 MG TABS tablet Take 1 tablet by mouth at bedtime as needed.  EPINEPHrine  (EPIPEN  2-PAK) 0.3 mg/0.3 mL IJ SOAJ injection Inject 0.3 mg into the muscle as needed for anaphylaxis. 2 each 2   Evolocumab  (REPATHA  SURECLICK) 140 MG/ML SOAJ Inject 140 mg into the skin every 14 (fourteen) days. 2 mL 11   famotidine  (PEPCID ) 40 MG tablet Take 40 mg by mouth 2 (two) times daily. (Patient taking differently: Take 40 mg by mouth daily.)     levocetirizine (XYZAL) 5 MG tablet Take 10 mg by mouth daily.     losartan (COZAAR) 50 MG tablet Take 50 mg by mouth daily.     lubiprostone (AMITIZA) 8 MCG capsule Take 8 mcg by mouth 2 (two) times daily.     montelukast  (SINGULAIR ) 10 MG tablet Take 1 tablet (10 mg total) by mouth daily. 90 tablet 1   mupirocin ointment (BACTROBAN) 2 % Apply 1 Application topically 2 (two) times daily. 30 g 2   nitroGLYCERIN  (NITROSTAT ) 0.4 MG SL tablet Place 1 tablet (0.4 mg total) under the tongue every 5 (five) minutes as needed for chest pain. 30 tablet 3   oxyCODONE  (OXY IR/ROXICODONE ) 5 MG immediate release tablet Take 1 tablet (5 mg total) by mouth at bedtime. 30 tablet 0   RABEprazole (ACIPHEX) 20 MG tablet Take 20 mg by mouth 2 (two) times daily.     Secukinumab (COSENTYX SENSOREADY PEN) 150 MG/ML SOAJ Inject 150 mg into the skin.     ticagrelor   (BRILINTA ) 90 MG TABS tablet Take 1 tablet (90 mg total) by mouth 2 (two) times daily. 180 tablet 3   triamcinolone  cream (KENALOG ) 0.1 % Apply 1 Application topically 2 (two) times daily. (Patient taking differently: Apply 1 Application topically as needed.) 30 g 0   No current facility-administered medications for this visit.    REVIEW OF SYSTEMS:   Constitutional: Denies fevers, chills or abnormal night sweats Eyes: Denies blurriness of vision, double vision or watery eyes Ears, nose, mouth, throat, and face: Denies mucositis or sore throat Respiratory: Denies cough, dyspnea or wheezes Cardiovascular: Denies palpitation, chest discomfort or lower extremity swelling Gastrointestinal:  Denies nausea, heartburn or change in bowel habits Skin: Denies abnormal skin rashes Lymphatics: Denies new lymphadenopathy or easy bruising Neurological:Denies numbness, tingling or new weaknesses Behavioral/Psych: Mood is stable, no new changes  All other systems were reviewed with the patient and are negative.  PHYSICAL EXAMINATION: ECOG PERFORMANCE STATUS: 1 - Symptomatic but completely ambulatory  There were no vitals filed for this visit.  There were no vitals filed for this visit.   GENERAL:alert, no distress and comfortable SKIN: skin color, texture, turgor are normal, no rashes or significant lesions EYES: normal, conjunctiva are pink and non-injected, sclera clear OROPHARYNX:no exudate, no erythema and lips, buccal mucosa, and tongue normal  NECK: supple, thyroid  normal size, non-tender, without nodularity LYMPH:  no palpable lymphadenopathy in the cervical, axillary or inguinal LUNGS: clear to auscultation and percussion with normal breathing effort HEART: regular rate & rhythm and no murmurs and no lower extremity edema ABDOMEN:abdomen soft, non-tender and normal bowel sounds Musculoskeletal:no cyanosis of digits and no clubbing  PSYCH: alert & oriented x 3 with fluent speech NEURO:  no focal motor/sensory deficits  RADIOGRAPHIC STUDIES: I have personally reviewed the radiological images as listed and agreed with the findings in the report. XR Ankle Complete Right Result Date: 06/25/2024 Radiographs of the right ankle shows a congruent mortise without structural defects.  XR Ankle Complete Left Result Date: 06/25/2024 Radiographs of the left ankle shows a congruent mortise without  any structural defects

## 2024-07-11 NOTE — Progress Notes (Signed)
 Patient presents with complaint of a painful split on the plantar posterior heel right.  Says the skin split open incidents extremely painful.  She been putting some tea tree oil on it.  Has not noticed anything other than clear drainage.  Does not recall any injury to the foot.  Physical Exam:  Patient alert and oriented x 3.  No complaints of nausea, vomiting, fever, or chills  Vascular: DP pulses 2/4 bilateral. PT pulses 2/4 lateral.  Mild edema lower extremity. Capillary fill time immediate bilaterally.  Dermatologic: Superficial ulceration/skin fissure plantar posterior medial aspect heel right.  No signs of infection.  Clear drainage.  Tender to touch.. Measures 20 mm wide x 2 mm long x 1 deep.   Neurologic: Grossly intact bilaterally  Musculoskeletal: No tenderness to lateral compression of the calcaneus.   Diagnoses: 1.  Superficial ulceration Wagner grade 1 plantar posterior medial heel right. Plan: - Separate office visit for evaluation and management level 3.  Discussed with her the fissuring and what she can do for long-term immediate will have her clean the area with soapy water twice a day apply Bactroban ointment and a light dressing.  Also dispensed surgical shoe for her to wear to offload the heel.  Long-term recommended AmLactin lotion along with something oily or greasy. -Sharp debridement with tissue nippers superficial Wagner grade 1 ulceration plantar posterior aspect heel right..  Debrided any devitalized tissue with a tissue nipper.  Applied antibiotic ointment and a light dressing -Dispensed surgical shoe right to help offload wound heal -Rx Bactroban ointment, apply twice daily to wound and cover with dressing.  Refill x 2  Return 1 week f/u ulcer heel right

## 2024-07-12 ENCOUNTER — Ambulatory Visit (HOSPITAL_BASED_OUTPATIENT_CLINIC_OR_DEPARTMENT_OTHER): Payer: Self-pay | Admitting: Student

## 2024-07-12 LAB — CBC WITH DIFFERENTIAL/PLATELET
Basophils Absolute: 0.1 x10E3/uL (ref 0.0–0.2)
Basos: 1 %
EOS (ABSOLUTE): 0.1 x10E3/uL (ref 0.0–0.4)
Eos: 1 %
Hematocrit: 42.5 % (ref 34.0–46.6)
Hemoglobin: 13.3 g/dL (ref 11.1–15.9)
Immature Grans (Abs): 0.1 x10E3/uL (ref 0.0–0.1)
Immature Granulocytes: 1 %
Lymphocytes Absolute: 3.3 x10E3/uL — ABNORMAL HIGH (ref 0.7–3.1)
Lymphs: 30 %
MCH: 25.8 pg — ABNORMAL LOW (ref 26.6–33.0)
MCHC: 31.3 g/dL — ABNORMAL LOW (ref 31.5–35.7)
MCV: 82 fL (ref 79–97)
Monocytes Absolute: 0.8 x10E3/uL (ref 0.1–0.9)
Monocytes: 7 %
Neutrophils Absolute: 6.7 x10E3/uL (ref 1.4–7.0)
Neutrophils: 60 %
Platelets: 340 x10E3/uL (ref 150–450)
RBC: 5.16 x10E6/uL (ref 3.77–5.28)
RDW: 22.5 % — ABNORMAL HIGH (ref 11.7–15.4)
WBC: 11 x10E3/uL — ABNORMAL HIGH (ref 3.4–10.8)

## 2024-07-18 ENCOUNTER — Ambulatory Visit (INDEPENDENT_AMBULATORY_CARE_PROVIDER_SITE_OTHER)

## 2024-07-18 ENCOUNTER — Ambulatory Visit: Admitting: Podiatry

## 2024-07-18 DIAGNOSIS — J309 Allergic rhinitis, unspecified: Secondary | ICD-10-CM

## 2024-07-24 ENCOUNTER — Encounter: Payer: Self-pay | Admitting: Hematology and Oncology

## 2024-07-24 ENCOUNTER — Inpatient Hospital Stay

## 2024-07-24 ENCOUNTER — Inpatient Hospital Stay: Attending: Hematology and Oncology | Admitting: Hematology and Oncology

## 2024-07-24 ENCOUNTER — Other Ambulatory Visit: Payer: Self-pay | Admitting: Hematology and Oncology

## 2024-07-24 ENCOUNTER — Telehealth: Payer: Self-pay | Admitting: Hematology and Oncology

## 2024-07-24 ENCOUNTER — Other Ambulatory Visit: Payer: Self-pay

## 2024-07-24 VITALS — BP 102/74 | HR 87 | Temp 98.4°F | Resp 20 | Ht 63.0 in | Wt 219.0 lb

## 2024-07-24 DIAGNOSIS — D649 Anemia, unspecified: Secondary | ICD-10-CM | POA: Diagnosis present

## 2024-07-24 DIAGNOSIS — Z79899 Other long term (current) drug therapy: Secondary | ICD-10-CM | POA: Insufficient documentation

## 2024-07-24 DIAGNOSIS — D509 Iron deficiency anemia, unspecified: Secondary | ICD-10-CM

## 2024-07-24 LAB — CMP (CANCER CENTER ONLY)
ALT: 16 U/L (ref 0–44)
AST: 23 U/L (ref 15–41)
Albumin: 4.1 g/dL (ref 3.5–5.0)
Alkaline Phosphatase: 122 U/L (ref 38–126)
Anion gap: 12 (ref 5–15)
BUN: 10 mg/dL (ref 6–20)
CO2: 23 mmol/L (ref 22–32)
Calcium: 9.6 mg/dL (ref 8.9–10.3)
Chloride: 106 mmol/L (ref 98–111)
Creatinine: 0.89 mg/dL (ref 0.44–1.00)
GFR, Estimated: >60 mL/min (ref >60)
Glucose, Bld: 131 mg/dL — ABNORMAL HIGH (ref 70–99)
Potassium: 3.5 mmol/L (ref 3.5–5.1)
Sodium: 141 mmol/L (ref 135–145)
Total Bilirubin: 0.4 mg/dL (ref 0.0–1.2)
Total Protein: 7.6 g/dL (ref 6.5–8.1)

## 2024-07-24 LAB — IRON AND TIBC
Iron: 51 ug/dL (ref 28–170)
Saturation Ratios: 13 % (ref 10.4–31.8)
TIBC: 396 ug/dL (ref 250–450)
UIBC: 346 ug/dL

## 2024-07-24 LAB — CBC WITH DIFFERENTIAL (CANCER CENTER ONLY)
Abs Immature Granulocytes: 0.03 K/uL (ref 0.00–0.07)
Basophils Absolute: 0.1 K/uL (ref 0.0–0.1)
Basophils Relative: 1 %
Eosinophils Absolute: 0.1 K/uL (ref 0.0–0.5)
Eosinophils Relative: 1 %
HCT: 43.5 % (ref 36.0–46.0)
Hemoglobin: 13.4 g/dL (ref 12.0–15.0)
Immature Granulocytes: 0 %
Lymphocytes Relative: 27 %
Lymphs Abs: 2.8 K/uL (ref 0.7–4.0)
MCH: 25.6 pg — ABNORMAL LOW (ref 26.0–34.0)
MCHC: 30.8 g/dL (ref 30.0–36.0)
MCV: 83 fL (ref 80.0–100.0)
Monocytes Absolute: 0.6 K/uL (ref 0.1–1.0)
Monocytes Relative: 6 %
Neutro Abs: 6.9 K/uL (ref 1.7–7.7)
Neutrophils Relative %: 65 %
Platelet Count: 341 K/uL (ref 150–400)
RBC: 5.24 MIL/uL — ABNORMAL HIGH (ref 3.87–5.11)
RDW: 22.9 % — ABNORMAL HIGH (ref 11.5–15.5)
WBC Count: 10.5 K/uL (ref 4.0–10.5)
nRBC: 0 % (ref 0.0–0.2)

## 2024-07-24 LAB — FOLATE: Folate: >20 ng/mL (ref >5.9)

## 2024-07-24 LAB — TSH: TSH: 1.08 u[IU]/mL (ref 0.350–4.500)

## 2024-07-24 LAB — VITAMIN B12: Vitamin B-12: 400 pg/mL (ref 180–914)

## 2024-07-24 LAB — FERRITIN: Ferritin: 31 ng/mL (ref 11–307)

## 2024-07-24 NOTE — Telephone Encounter (Signed)
 Patient has been scheduled for follow-up visit per 07/24/2024 LOS.  LVM notifying pt of appt details, provided my direct number to pt if appt changes need to be made.

## 2024-07-24 NOTE — Progress Notes (Signed)
 [facility] CONSULT NOTE  Patient Care Team: Rothfuss, Jacob T, PA-C as PCP - General (Physician Assistant) Michele Richardson, DO as PCP - Cardiology (Cardiology) Harl Eleanor LABOR, NP as Nurse Practitioner (Hematology and Oncology)  ASSESSMENT & PLAN:  Anemia: Worsening symptoms of anemia including shortness of breath and fatigue. CBC reveals hemoglobin 11.1 with recent iron  saturation 6 and ferritin 7. She received iron  infusion and is here for repeat evaluation. She notes that she remains symptomatic with shortness of breath and fatigue. She has been evaluated with cardiology as well as pulmonology. We will discuss iron  studies once they are resulted and if iron  is improved, we will possibly refer to rheumatology next for workup.     All questions were answered. The patient knows to call the clinic with any problems, questions or concerns.  The total time spent in the appointment was 20 minutes encounter with patients including review of chart and various tests results, discussions about plan of care and coordination of care plan  Eleanor LABOR Harl, NP 06/14/2024   CHIEF COMPLAINTS/PURPOSE OF CONSULTATION:  Anemia  HISTORY OF PRESENTING ILLNESS:  Casey [name] 53 y.o. female is here because of anemia  She was found to have abnormal CBC from 10/31 She denies recent chest pain on exertion, pre-syncopal episodes, or palpitations. She complains of shortness of breath and fatigue. She had not noticed any recent bleeding such as epistaxis, hematuria or hematochezia The patient denies over the counter NSAID ingestion. She is not  on antiplatelets agents. Her last colonoscopy was n/a She had no prior history or diagnosis of cancer. Her age appropriate screening programs are up-to-date. She denies any pica and eats a variety of diet. She never donated blood or received blood transfusion The patient was not prescribed oral iron  supplements  MEDICAL HISTORY:  Past Medical  History:  Diagnosis Date   Albuminuria 06/27/2015   Anxiety 11/04/2014   Arm DVT (deep venous thromboembolism), acute, left (HCC) 08/17/2022   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/30/2014   Asthma    Atopic dermatitis 10/30/2008   Formatting of this note might be different from the original. Atopic dermatitis Formatting of this note might be different from the original. Formatting of this note might be different from the original. Atopic dermatitis Formatting of this note might be different from the original. Formatting of this note might be different from the original. Formatting of this note might be different from the or   Cataract 05/06/2015   Contusion of left wrist 07/08/2021   Coronary artery disease s/p PCI/DES to LAD 07/01/2023 12/28/2022   Crohn's disease (HCC) 04/21/2015   De Quervain's tenosynovitis, left 07/31/2021   Diabetes mellitus without complication (HCC)    Diabetes type 2, controlled (HCC) 04/21/2015   Dry eyes 05/06/2015   DVT (deep venous thrombosis) (HCC)    Dysfunction of both eustachian tubes 08/31/2012   Encounter for monitoring immunomodulating therapy 11/20/2021   Environmental allergies 08/12/2020   Esophageal candidiasis (HCC) 06/26/2022   Essential hypertension 06/27/2015   Fibrositis 08/28/2014   Formatting of this note might be different from the original. Fibromyalgia Formatting of this note might be different from the original. Formatting of this note might be different from the original. Fibromyalgia   Flank pain 11/24/2020   Gastroesophageal reflux disease with esophagitis without hemorrhage 08/01/2019   Formatting of this note might be different from the original. Formatting of this note might be different from the original. 07/2019: chronic symptoms of esophageal reflux  since prior to sleeve gastrectomy surgery. Symptoms have worsened over the last 6 months with recent EGD findings of grade B esophagitis, irregular z line,  erythematous mucosa, and evidence of sleeve gastrectomy. 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 Formatting of this note might be different from the original. 07/2019: 8 mm colonic polyp removed during colonoscopy, biopsy showed serrated polyp. Repeat colonoscopy recommended in one year. Formatting of this note might be different from the original. Added automatically from request for surgery [phone] Formatting of this note might b   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   History of kidney stones 08/12/2020   Hypercholesterolemia 10/07/2022   Hypertension, essential, benign 06/27/2015   Hypertensive disorder 08/28/2014   Formatting of this note might be different from the original. Hypertension Formatting of this note might be different from the original. Formatting of this note might be different from the original. 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 Keratoconus Formatting of this note might be different from the original. Formatting of this note might be different from the original. Keratoconus Formatting of this note might be different from the original. Formatting of this note might be different from the original. Keratoconus Formatting of this note might be different from the or   Microhematuria 06/27/2015   Migraine 08/28/2014   Formatting of this note might be different from the original. Migraine Formatting of this note might be different from the original. Formatting of this note might be different from the original. Migraine Formatting of this note might be  different from the original. Migraine Formatting of this note might be different from the original. Migraine Formatting of this note might be different from the or   Migraine without aura and without status migrainosus, not intractable 08/28/2014   Formatting of this note might be different from the original. Formatting of this note might be different from the original. Formatting of this note might be different from the original. Migraine Formatting of this note might be different from the original. Migraine Formatting of this note might be different from the original. Formatting of this note might be different from the original. Migraine F   Mild intermittent asthma without complication 10/30/2008   Formatting of this note might be different from the original. Formatting of this note might be different from the original. Formatting of this note might be different from the original. Asthma Formatting of this note might be different from the original. Asthma Formatting of this note might be different from the original. Formatting of this note might be different from the original. 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 Formatting of this note might be different from the original. 01/25/2016 with Dr. Loney Pol at [facility]. [place]  of this note might be different from the original. 01/25/2016 with Dr. Loney Pol at [facility]. [place] of this note might be different from the original. Formatting of this note might be different from    Seronegative inflammatory arthritis 08/28/2014   Formatting of this note might be different from the original. Formatting of this note might be different from the original. Arthritis; diagnosed as IBD related - 2012 Formatting of this note might be different from the original. Followed by rheumatologist. Had to change providers due to an insurance change earlier this year resulting in patient not being able to have Remicade  for ~6 months. 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 Formatting of this note might be different from the original. Treated with PRN miralax . Recent colonoscopy 07/2019. Formatting of this note might be different from the original. Treated with PRN miralax . Recent colonoscopy 07/2019. Formatting of this note might be different from the original. Formatting of this note might be different f   Status post placement of ureteral stent 02/06/2021   Tooth infection 08/12/2020   Unstable angina (HCC) 07/29/2023   Ureteral stone 01/20/2022   Urinary tract infection, site not specified 06/26/2022    SURGICAL HISTORY: Past Surgical History:  Procedure Laterality Date   BACK SURGERY     L5-S1   CATARACT EXTRACTION Left    CERVICAL CONE BIOPSY  04/2000   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   CORONARY STENT INTERVENTION N/A 07/01/2023   Procedure: CORONARY STENT INTERVENTION;  Surgeon: Darron Deatrice LABOR, MD;  Location: [facility];  Service: Cardiovascular;  Laterality: N/A;   EYE SURGERY     GASTRIC BYPASS  2017   [place]   LAPAROSCOPIC GASTRIC SLEEVE RESECTION     LEFT HEART CATH AND CORONARY ANGIOGRAPHY N/A 07/01/2023   Procedure: LEFT HEART CATH AND  CORONARY ANGIOGRAPHY;  Surgeon: Darron Deatrice LABOR, MD;  Location: [facility];  Service: Cardiovascular;  Laterality: N/A;   LEFT HEART CATH AND CORONARY ANGIOGRAPHY N/A 08/01/2023   Procedure: LEFT HEART CATH AND CORONARY ANGIOGRAPHY;  Surgeon: Ladona Heinz, MD;  Location: [facility];  Service: Cardiovascular;  Laterality: N/A;   Low back disc surgery     06/2000   TOTAL KNEE ARTHROPLASTY Right 11/2016    SOCIAL HISTORY: Social History   Socioeconomic History   Marital status: Married    Spouse name: Not on file   Number of children: 0   Years of education: Not on file   Highest education level: Professional school degree (e.g., MD, DDS, DVM, JD)  Occupational History   Not on file  Tobacco Use   Smoking status: Never    Passive exposure: Never   Smokeless tobacco: Never  Vaping Use   Vaping status: Never Used  Substance and Sexual Activity   Alcohol use: Not Currently    Comment: rare   Drug use: Never   Sexual activity: Not Currently  Other Topics Concern   Not on file  Social History Narrative   2 dogs    Social Drivers of Health   Tobacco Use: Low Risk (07/24/2024)   Patient History  Smoking Tobacco Use: Never    Smokeless Tobacco Use: Never    Passive Exposure: Never  Financial Resource Strain: Medium Risk (07/10/2024)   Overall Financial Resource Strain (CARDIA)    Difficulty of Paying Living Expenses: Somewhat hard  Food Insecurity: Food Insecurity Present (07/10/2024)   Epic    Worried About Programme Researcher, Broadcasting/film/video in the Last Year: Sometimes true    Ran Out of Food in the Last Year: Never true  Transportation Needs: No Transportation Needs (07/10/2024)   Epic    Lack of Transportation (Medical): No    Lack of Transportation (Non-Medical): No  Physical Activity: Inactive (07/10/2024)   Exercise Vital Sign    Days of Exercise per Week: 0 days    Minutes of Exercise per Session: Not on file  Stress: No Stress Concern Present (07/10/2024)    [employer] of Occupational Health - Occupational Stress Questionnaire    Feeling of Stress: Not at all  Social Connections: Moderately Isolated (07/10/2024)   Social Connection and Isolation Panel    Frequency of Communication with Friends and Family: More than three times a week    Frequency of Social Gatherings with Friends and Family: Once a week    Attends Religious Services: Never    Database Administrator or Organizations: No    Attends Engineer, Structural: Not on file    Marital Status: Married  Catering Manager Violence: Not At Risk (06/08/2024)   Epic    Fear of Current or Ex-Partner: No    Emotionally Abused: No    Physically Abused: No    Sexually Abused: No  Depression (PHQ2-9): Low Risk (07/24/2024)   Depression (PHQ2-9)    PHQ-2 Score: 0  Recent Concern: Depression (PHQ2-9) - Medium Risk (07/10/2024)   Depression (PHQ2-9)    PHQ-2 Score: 9  Alcohol Screen: Low Risk (07/10/2024)   Alcohol Screen    Last Alcohol Screening Score (AUDIT): 1  Housing: Low Risk (07/10/2024)   Epic    Unable to Pay for Housing in the Last Year: No    Number of Times Moved in the Last Year: 0    Homeless in the Last Year: No  Recent Concern: Housing - High Risk (06/08/2024)   Epic    Unable to Pay for Housing in the Last Year: Yes    Number of Times Moved in the Last Year: 0    Homeless in the Last Year: No  Utilities: Not At Risk (06/08/2024)   Epic    Threatened with loss of utilities: No  Health Literacy: Not on file    FAMILY HISTORY: Family History  Adopted: Yes  Problem Relation Age of Onset   Diabetes Mother    Hypertension Mother    Diabetes Father    Hypertension Father    Hypertension Sister    Diabetes Sister    Hypertension Brother    Diabetes Brother    Diabetes Paternal Grandmother    Cancer Other    Asthma Neg Hx    Allergic rhinitis Neg Hx    Atopy Neg Hx    Eczema Neg Hx     ALLERGIES:  is allergic to albuterol  sulfate, corticosteroids,  silicone, tape, wound dressings, formoterol, hydrocodone , hydrocodone -acetaminophen , nickel, other, oxycodone -acetaminophen , salmeterol, strawberry extract, advair hfa [fluticasone-salmeterol], proventil  hfa [albuterol ], asa [aspirin ], cleocin [clindamycin], clindamycin/lincomycin, fludrocortisone acetate, gabapentin, hydrochlorothiazide, imipramine, ipratropium bromide, medroxyprogesterone, meloxicam, metformin and related, nystatin, penicillins, pred forte [prednisolone], prednisone, solu-medrol  [methylprednisolone ], sulfa antibiotics, tegaderm ag mesh [silver], toradol  [ketorolac   tromethamine ], tramadol, and venofer  [iron  sucrose].  MEDICATIONS:  Current Outpatient Medications  Medication Sig Dispense Refill   triamcinolone  ointment (KENALOG ) 0.1 % Apply 1 Application topically 2 (two) times daily.     acetaminophen  (TYLENOL ) 500 MG tablet Take 1,000 mg by mouth every 12 (twelve) hours as needed for mild pain or moderate pain.     albuterol  (PROVENTIL ) (2.5 MG/3ML) 0.083% nebulizer solution Take 3 mLs (2.5 mg total) by nebulization every 4 (four) hours as needed for wheezing or shortness of breath. 540 mL 12   aMILoride (MIDAMOR) 5 MG tablet Take 10 mg by mouth daily.     atorvastatin  (LIPITOR) 40 MG tablet Take 2 tablets (80 mg total) by mouth daily. 180 tablet 3   B Complex-C (B-COMPLEX WITH VITAMIN C) tablet Take 1 tablet by mouth daily.     Cholecalciferol  (VITAMIN D3) 50 MCG (2000 UT) TABS Take 1 tablet by mouth daily.     Difluprednate 0.05 % EMUL Apply 1 drop to eye every other day.     diphenhydramine -acetaminophen  (TYLENOL  PM) 25-500 MG TABS tablet Take 1 tablet by mouth at bedtime as needed.     EPINEPHrine  (EPIPEN  2-PAK) 0.3 mg/0.3 mL IJ SOAJ injection Inject 0.3 mg into the muscle as needed for anaphylaxis. 2 each 2   Evolocumab  (REPATHA  SURECLICK) 140 MG/ML SOAJ Inject 140 mg into the skin every 14 (fourteen) days. 2 mL 11   famotidine  (PEPCID ) 40 MG tablet Take 40 mg by mouth 2 (two)  times daily. (Patient taking differently: Take 40 mg by mouth daily.)     levocetirizine (XYZAL) 5 MG tablet Take 10 mg by mouth daily.     losartan (COZAAR) 50 MG tablet Take 50 mg by mouth daily.     lubiprostone (AMITIZA) 8 MCG capsule Take 8 mcg by mouth 2 (two) times daily.     montelukast  (SINGULAIR ) 10 MG tablet Take 1 tablet (10 mg total) by mouth daily. 90 tablet 1   mupirocin  ointment (BACTROBAN ) 2 % Apply 1 Application topically 2 (two) times daily. 30 g 2   nitroGLYCERIN  (NITROSTAT ) 0.4 MG SL tablet Place 1 tablet (0.4 mg total) under the tongue every 5 (five) minutes as needed for chest pain. 30 tablet 3   oxyCODONE  (OXY IR/ROXICODONE ) 5 MG immediate release tablet Take 1 tablet (5 mg total) by mouth at bedtime. 30 tablet 0   RABEprazole (ACIPHEX) 20 MG tablet Take 20 mg by mouth 2 (two) times daily.     Secukinumab (COSENTYX SENSOREADY PEN) 150 MG/ML SOAJ Inject 150 mg into the skin.     ticagrelor  (BRILINTA ) 90 MG TABS tablet Take 1 tablet (90 mg total) by mouth 2 (two) times daily. 180 tablet 3   triamcinolone  cream (KENALOG ) 0.1 % Apply 1 Application topically 2 (two) times daily. (Patient taking differently: Apply 1 Application topically as needed.) 30 g 0   No current facility-administered medications for this visit.    REVIEW OF SYSTEMS:   Constitutional: Denies fevers, chills or abnormal night sweats Eyes: Denies blurriness of vision, double vision or watery eyes Ears, nose, mouth, throat, and face: Denies mucositis or sore throat Respiratory: Denies cough, dyspnea or wheezes Cardiovascular: Denies palpitation, chest discomfort or lower extremity swelling Gastrointestinal:  Denies nausea, heartburn or change in bowel habits Skin: Denies abnormal skin rashes Lymphatics: Denies new lymphadenopathy or easy bruising Neurological:Denies numbness, tingling or new weaknesses Behavioral/Psych: Mood is stable, no new changes  All other systems were reviewed with the patient and  are  negative.  PHYSICAL EXAMINATION: ECOG PERFORMANCE STATUS: 1 - Symptomatic but completely ambulatory  Vitals:   07/24/24 1050 07/24/24 1058  BP: 126/85 102/74  Pulse: 87   Resp: 20   Temp: 98.4 F (36.9 C)   SpO2: 98%     Filed Weights   07/24/24 1050  Weight: 219 lb (99.3 kg)     GENERAL:alert, no distress and comfortable SKIN: skin color, texture, turgor are normal, no rashes or significant lesions EYES: normal, conjunctiva are pink and non-injected, sclera clear OROPHARYNX:no exudate, no erythema and lips, buccal mucosa, and tongue normal  NECK: supple, thyroid  normal size, non-tender, without nodularity LYMPH:  no palpable lymphadenopathy in the cervical, axillary or inguinal LUNGS: clear to auscultation and percussion with normal breathing effort HEART: regular rate & rhythm and no murmurs and no lower extremity edema ABDOMEN:abdomen soft, non-tender and normal bowel sounds Musculoskeletal:no cyanosis of digits and no clubbing  PSYCH: alert & oriented x 3 with fluent speech NEURO: no focal motor/sensory deficits  RADIOGRAPHIC STUDIES: I have personally reviewed the radiological images as listed and agreed with the findings in the report. No results found.

## 2024-07-31 ENCOUNTER — Encounter: Attending: Physical Medicine & Rehabilitation | Admitting: Registered Nurse

## 2024-08-01 ENCOUNTER — Encounter: Payer: Self-pay | Admitting: Registered Nurse

## 2024-08-01 ENCOUNTER — Encounter: Attending: Physical Medicine & Rehabilitation | Admitting: Registered Nurse

## 2024-08-01 VITALS — Ht 63.0 in

## 2024-08-01 DIAGNOSIS — G8929 Other chronic pain: Secondary | ICD-10-CM | POA: Diagnosis not present

## 2024-08-01 DIAGNOSIS — M545 Low back pain, unspecified: Secondary | ICD-10-CM | POA: Insufficient documentation

## 2024-08-01 DIAGNOSIS — M546 Pain in thoracic spine: Secondary | ICD-10-CM | POA: Insufficient documentation

## 2024-08-01 DIAGNOSIS — M542 Cervicalgia: Secondary | ICD-10-CM | POA: Diagnosis not present

## 2024-08-01 DIAGNOSIS — M1380 Other specified arthritis, unspecified site: Secondary | ICD-10-CM | POA: Insufficient documentation

## 2024-08-01 NOTE — Progress Notes (Addendum)
 "  Subjective:    Patient ID: Casey [name], female    DOB: 05/19/1971, 53 y.o.   MRN: [id number]  HPI: Casey [name] is a 53 y.o. female who is scheduled for Virtual visit, she reports she has a conflict with her appointments, she has a scheduled appointment with ID in [place].  I connected with  Casey [name] by  video and verified that I am speaking with the correct person using two identifiers.  On virtual visit patient was very argumentative and combative to this provider and Forensic Psychologist, e-mail was sent to Dr Carilyn regarding the above,  Location: Patient: In her home  Provider: In the office    I discussed the limitations, risks, security and privacy concerns of performing an evaluation and management service by telephone and the availability of in person appointments. I also discussed with the patient that there may be a patient responsible charge related to this service. The patient expressed understanding and agreed to proceed.  She states her pain is located in her in neck, upper- lower back and generalized joint pain. She rates her pain 7. Her current exercise regime is walking.   [name] Morphine  equivalent is 7.50 MME.   Last UDS was Performed on 05/08/2024,     Pain Inventory Average Pain 7 Pain Right Now 7 My pain is intermittent  In the last 24 hours, has pain interfered with the following? General activity refused to answer Relation with others refused to answer  Enjoyment of life refused to answer  What TIME of day is your pain at its worst? varies Sleep (in general) NA  Pain is worse with: some activites Pain improves with: medication Relief from Meds: no anwer  Family History  Adopted: Yes  Problem Relation Age of Onset   Diabetes Mother    Hypertension Mother    Diabetes Father    Hypertension Father    Hypertension Sister    Diabetes Sister    Hypertension Brother    Diabetes Brother    Diabetes Paternal Grandmother    Cancer Other    Asthma  Neg Hx    Allergic rhinitis Neg Hx    Atopy Neg Hx    Eczema Neg Hx    Social History   Socioeconomic History   Marital status: Married    Spouse name: Not on file   Number of children: 0   Years of education: Not on file   Highest education level: Professional school degree (e.g., MD, DDS, DVM, JD)  Occupational History   Not on file  Tobacco Use   Smoking status: Never    Passive exposure: Never   Smokeless tobacco: Never  Vaping Use   Vaping status: Never Used  Substance and Sexual Activity   Alcohol use: Not Currently    Comment: rare   Drug use: Never   Sexual activity: Not Currently  Other Topics Concern   Not on file  Social History Narrative   2 dogs    Social Drivers of Health   Tobacco Use: Low Risk (07/24/2024)   Patient History    Smoking Tobacco Use: Never    Smokeless Tobacco Use: Never    Passive Exposure: Never  Financial Resource Strain: Medium Risk (07/10/2024)   Overall Financial Resource Strain (CARDIA)    Difficulty of Paying Living Expenses: Somewhat hard  Food Insecurity: Food Insecurity Present (07/10/2024)   Epic    Worried About Radiation Protection Practitioner of Food in the Last Year: Sometimes true  Ran Out of Food in the Last Year: Never true  Transportation Needs: No Transportation Needs (07/10/2024)   Epic    Lack of Transportation (Medical): No    Lack of Transportation (Non-Medical): No  Physical Activity: Inactive (07/10/2024)   Exercise Vital Sign    Days of Exercise per Week: 0 days    Minutes of Exercise per Session: Not on file  Stress: No Stress Concern Present (07/10/2024)   [employer] of Occupational Health - Occupational Stress Questionnaire    Feeling of Stress: Not at all  Social Connections: Moderately Isolated (07/10/2024)   Social Connection and Isolation Panel    Frequency of Communication with Friends and Family: More than three times a week    Frequency of Social Gatherings with Friends and Family: Once a week    Attends  Religious Services: Never    Database Administrator or Organizations: No    Attends Engineer, Structural: Not on file    Marital Status: Married  Depression (PHQ2-9): Low Risk (07/24/2024)   Depression (PHQ2-9)    PHQ-2 Score: 0  Recent Concern: Depression (PHQ2-9) - Medium Risk (07/10/2024)   Depression (PHQ2-9)    PHQ-2 Score: 9  Alcohol Screen: Low Risk (07/10/2024)   Alcohol Screen    Last Alcohol Screening Score (AUDIT): 1  Housing: Low Risk (07/10/2024)   Epic    Unable to Pay for Housing in the Last Year: No    Number of Times Moved in the Last Year: 0    Homeless in the Last Year: No  Recent Concern: Housing - High Risk (06/08/2024)   Epic    Unable to Pay for Housing in the Last Year: Yes    Number of Times Moved in the Last Year: 0    Homeless in the Last Year: No  Utilities: Not At Risk (06/08/2024)   Epic    Threatened with loss of utilities: No  Health Literacy: Not on file   Past Surgical History:  Procedure Laterality Date   BACK SURGERY     L5-S1   CATARACT EXTRACTION Left    CERVICAL CONE BIOPSY  04/2000   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   CORONARY STENT INTERVENTION N/A 07/01/2023   Procedure: CORONARY STENT INTERVENTION;  Surgeon: Darron Deatrice LABOR, MD;  Location: [facility];  Service: Cardiovascular;  Laterality: N/A;   EYE SURGERY     GASTRIC BYPASS  2017   [place]   LAPAROSCOPIC GASTRIC SLEEVE RESECTION     LEFT HEART CATH AND CORONARY ANGIOGRAPHY N/A 07/01/2023   Procedure: LEFT HEART CATH AND CORONARY ANGIOGRAPHY;  Surgeon: Darron Deatrice LABOR, MD;  Location: [facility];  Service: Cardiovascular;  Laterality: N/A;   LEFT HEART CATH AND CORONARY ANGIOGRAPHY N/A 08/01/2023   Procedure: LEFT HEART CATH AND CORONARY ANGIOGRAPHY;  Surgeon: Ladona Heinz, MD;  Location: [facility];  Service: Cardiovascular;  Laterality: N/A;   Low back disc surgery     06/2000   TOTAL KNEE ARTHROPLASTY Right 11/2016   Past Surgical History:   Procedure Laterality Date   BACK SURGERY     L5-S1   CATARACT EXTRACTION Left    CERVICAL CONE BIOPSY  04/2000   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   CORONARY STENT INTERVENTION N/A 07/01/2023   Procedure: CORONARY STENT INTERVENTION;  Surgeon: Darron Deatrice LABOR, MD;  Location: [facility];  Service: Cardiovascular;  Laterality: N/A;   EYE SURGERY     GASTRIC BYPASS  2017  [place]   LAPAROSCOPIC GASTRIC SLEEVE RESECTION     LEFT HEART CATH AND CORONARY ANGIOGRAPHY N/A 07/01/2023   Procedure: LEFT HEART CATH AND CORONARY ANGIOGRAPHY;  Surgeon: Darron Deatrice LABOR, MD;  Location: [facility];  Service: Cardiovascular;  Laterality: N/A;   LEFT HEART CATH AND CORONARY ANGIOGRAPHY N/A 08/01/2023   Procedure: LEFT HEART CATH AND CORONARY ANGIOGRAPHY;  Surgeon: Ladona Heinz, MD;  Location: [facility];  Service: Cardiovascular;  Laterality: N/A;   Low back disc surgery     06/2000   TOTAL KNEE ARTHROPLASTY Right 11/2016   Past Medical History:  Diagnosis Date   Albuminuria 06/27/2015   Anxiety 11/04/2014   Arm DVT (deep venous thromboembolism), acute, left (HCC) 08/17/2022   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/30/2014   Asthma    Atopic dermatitis 10/30/2008   Formatting of this note might be different from the original. Atopic dermatitis Formatting of this note might be different from the original. Formatting of this note might be different from the original. Atopic dermatitis Formatting of this note might be different from the original. Formatting of this note might be different from the original. Formatting of this note might be different from the or   Cataract 05/06/2015   Contusion of left wrist 07/08/2021   Coronary artery disease s/p PCI/DES to LAD 07/01/2023 12/28/2022   Crohn's disease (HCC) 04/21/2015   De Quervain's tenosynovitis, left 07/31/2021   Diabetes mellitus without complication (HCC)    Diabetes type 2, controlled (HCC) 04/21/2015    Dry eyes 05/06/2015   DVT (deep venous thrombosis) (HCC)    Dysfunction of both eustachian tubes 08/31/2012   Encounter for monitoring immunomodulating therapy 11/20/2021   Environmental allergies 08/12/2020   Esophageal candidiasis (HCC) 06/26/2022   Essential hypertension 06/27/2015   Fibrositis 08/28/2014   Formatting of this note might be different from the original. Fibromyalgia Formatting of this note might be different from the original. Formatting of this note might be different from the original. Fibromyalgia   Flank pain 11/24/2020   Gastroesophageal reflux disease with esophagitis without hemorrhage 08/01/2019   Formatting of this note might be different from the original. Formatting of this note might be different from the original. 07/2019: chronic symptoms of esophageal reflux since prior to sleeve gastrectomy surgery. Symptoms have worsened over the last 6 months with recent EGD findings of grade B esophagitis, irregular z line, erythematous mucosa, and evidence of sleeve gastrectomy. 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 Formatting of this note might be different from the original. 07/2019: 8 mm colonic polyp removed during colonoscopy, biopsy showed serrated polyp. Repeat colonoscopy recommended in one year. Formatting of this note might be different from the original. Added automatically from request for surgery [phone] Formatting of this note might b   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   History of kidney stones 08/12/2020   Hypercholesterolemia 10/07/2022   Hypertension, essential, benign 06/27/2015   Hypertensive disorder 08/28/2014   Formatting of this note might be different from the original. Hypertension Formatting  of this note might be different from the original. Formatting of this note might be different from the original. 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 Keratoconus Formatting of this note might be different from the original. Formatting of this note might be different from the original. Keratoconus Formatting of this note might be different from the original. Formatting of this note might be different from the original. Keratoconus Formatting of this note might be different from the or   Microhematuria 06/27/2015   Migraine 08/28/2014   Formatting of this note might be different from the original. Migraine Formatting of this note might be different from the original. Formatting of this note might be different from the original. Migraine Formatting of this note might be different from the original. Migraine Formatting of this note might be different from the original. Migraine Formatting of this note might be different from the or   Migraine without aura and without status migrainosus, not intractable 08/28/2014   Formatting of this note might be different from the original. Formatting of this note might be different from the original. Formatting of this note might be different from the original. Migraine Formatting of this note might be different from the original. Migraine Formatting of this note might be different from the original. Formatting of this note might be different from the original. Migraine F   Mild intermittent asthma without complication 10/30/2008   Formatting of this note might be different from the original. Formatting of this note might be different from the original. Formatting of this note might be different from the original. Asthma Formatting of this note might be different from the original. Asthma  Formatting of this note might be different from the original. Formatting of this note might be different from the original. 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 Formatting of this note might be different from the original. 01/25/2016 with Dr. Loney Pol at [facility]. [place] of this note might be different from the original. 01/25/2016 with Dr. Loney Pol at [facility]. [place] of this note might be different from the original. Formatting of this note might be different from    Seronegative inflammatory arthritis 08/28/2014   Formatting of this note might be different from the original. Formatting of this note might be different from the original. Arthritis; diagnosed as IBD related - 2012 Formatting of this note might be different from the original. Followed by rheumatologist. Had to change providers due to an insurance change earlier this year resulting in patient not being able to have Remicade  for ~6 months. 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 Formatting of this note might be different from the original. Treated with PRN miralax . Recent colonoscopy 07/2019. Formatting of this note might be different  from the original. Treated with PRN miralax . Recent colonoscopy  07/2019. Formatting of this note might be different from the original. Formatting of this note might be different f   Status post placement of ureteral stent 02/06/2021   Tooth infection 08/12/2020   Unstable angina (HCC) 07/29/2023   Ureteral stone 01/20/2022   Urinary tract infection, site not specified 06/26/2022   Ht 5' 3 (1.6 m)   BMI 38.79 kg/m   Opioid Risk Score:   Fall Risk Score:  `1  Depression screen [facility] 2/9     07/24/2024   10:53 AM 07/10/2024    3:46 PM 06/26/2024    1:27 PM 06/20/2024    3:36 PM 06/08/2024    9:52 AM 05/08/2024   10:51 AM 12/14/2023   10:50 AM  Depression screen PHQ 2/9  Decreased Interest 0 0 0 0 0 0 0  Down, Depressed, Hopeless 0 0 0 0 0 0 0  PHQ - 2 Score 0 0 0 0 0 0 0  Altered sleeping  3    3   Tired, decreased energy  3    3   Change in appetite  0    0   Feeling bad or failure about yourself   0    0   Trouble concentrating  3    0   Moving slowly or fidgety/restless  0    0   Suicidal thoughts  0    0   PHQ-9 Score  9    6    Difficult doing work/chores  Very difficult    Very difficult      Data saved with a previous flowsheet row definition     Review of Systems  All other systems reviewed and are negative.      Objective:   Physical Exam Vitals and nursing note reviewed.  Musculoskeletal:     Comments: No Physical Exam Performed: Virtual Visit            Assessment & Plan:  Seronegative Arthritis: [facility]: Following. Continue to monitor.  Cervicalgia: Continue HEP as Tolerated. Continue to monitor.  Chronic Bilateral Thoracic Pain: Continue HEP as Tolerated. Continue to Monitor.  Chronic Bilateral Lower Back Pain without Sciatica: Continue HEP as Tolerated. Continue to Monitor.  Chronic Pain Syndrome: Continue Oxycodone  5 mg at HS as needed for pain , she will call to schedule F/U appointment. Her prescription will be sent once she is scheduled for F/U  appointment with Dr Carilyn, she verbalizes understanding.   F/U with Dr Carilyn in 1 month   Virtual Visit  Established Patient  Location of Patient: In her Home  Location of Provider in the office   "

## 2024-08-03 ENCOUNTER — Ambulatory Visit (HOSPITAL_BASED_OUTPATIENT_CLINIC_OR_DEPARTMENT_OTHER)
Admission: EM | Admit: 2024-08-03 | Discharge: 2024-08-03 | Disposition: A | Discharge status: Home / Self Care | Attending: Family Medicine | Admitting: Family Medicine

## 2024-08-03 ENCOUNTER — Encounter (HOSPITAL_BASED_OUTPATIENT_CLINIC_OR_DEPARTMENT_OTHER): Payer: Self-pay

## 2024-08-03 DIAGNOSIS — B370 Candidal stomatitis: Secondary | ICD-10-CM | POA: Diagnosis present

## 2024-08-03 DIAGNOSIS — J029 Acute pharyngitis, unspecified: Secondary | ICD-10-CM | POA: Insufficient documentation

## 2024-08-03 LAB — POCT RAPID STREP A (OFFICE): Rapid Strep A Screen: NEGATIVE

## 2024-08-03 NOTE — Discharge Instructions (Addendum)
 Sore throat: Rapid strep is negative.  Throat culture sent.  Will adjust the plan of care, if needed once the culture results.  No antibiotics for now.  Oral thrush: Patient has fluconazole  that she was given on 08/01/2024 from infectious disease.  She may use the fluconazole  as needed.  Follow-up with primary care or infectious disease as needed.  Return here if needed.  Patient declined discharge paperwork.

## 2024-08-03 NOTE — ED Triage Notes (Signed)
 Patient here today with c/o hoarseness and ST X 1 week. Patient has a h/o thrust. Patient has been using cough drops with little relief.

## 2024-08-03 NOTE — ED Provider Notes (Signed)
 " [facility]    CSN: [id number] Arrival date & time: 08/03/24  1117      History   Chief Complaint Chief Complaint  Patient presents with   Hoarse    HPI Casey [name] is a 53 y.o. female.   53 year old patient with a tremendous amount of chronic health problems including hypertension and diabetes.  She has chronic oral thrush and sees infectious disease.  She reports that oral fluconazole  does nothing for her thrush.  She saw infectious disease on 08/01/2024.  The physician decided she did have thrush but it was not bad enough to need IV fluconazole  or IV treatment and that provider sent in oral fluconazole .  The patient has not filled nor taken oral fluconazole  because she believes it will not work.  She has had sore throat and throat pain since approximately 07/27/2024 or earlier.  She also has hoarse or lost voice now.  That is not common when she has thrush.  She has used some cough drops with some relief of her symptoms.  She worries that she has strep throat.     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 Atopic dermatitis Formatting of this note might be different from the original. Formatting of this note might be different from the original. Atopic dermatitis Formatting of this note might be different from the original. Formatting of this note might be different from the original. Formatting of this note might be different from the or   Cataract 05/06/2015   Contusion of left wrist 07/08/2021   Coronary artery disease s/p PCI/DES to LAD 07/01/2023 12/28/2022   Crohn's disease (HCC) 04/21/2015   De Quervain's tenosynovitis, left 07/31/2021   Diabetes mellitus without complication  (HCC)    Diabetes type 2, controlled (HCC) 04/21/2015   Dry eyes 05/06/2015   DVT (deep venous thrombosis) (HCC)    Dysfunction of both eustachian tubes 08/31/2012   Encounter for monitoring immunomodulating therapy 11/20/2021   Environmental allergies 08/12/2020   Esophageal candidiasis (HCC) 06/26/2022   Essential hypertension 06/27/2015   Fibrositis 08/28/2014   Formatting of this note might be different from the original. Fibromyalgia Formatting of this note might be different from the original. Formatting of this note might be different from the original. Fibromyalgia   Flank pain 11/24/2020   Gastroesophageal reflux disease with esophagitis without hemorrhage 08/01/2019   Formatting of this note might be different from the original. Formatting of this note might be different from the original. 07/2019: chronic symptoms of esophageal reflux since prior to sleeve gastrectomy surgery. Symptoms have worsened over the last 6 months with recent EGD findings of grade B esophagitis, irregular z line, erythematous mucosa, and evidence of sleeve gastrectomy. 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 Formatting of this note might be different from the original. 07/2019: 8 mm colonic polyp removed during colonoscopy, biopsy showed serrated polyp. Repeat colonoscopy recommended in one year. Formatting of this note might be different from  the original. 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 Hypertension Formatting of this note might be different from the original. Formatting of this note might be different from the original. 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 Keratoconus Formatting of this note might be different from the original. Formatting of this note might be different from the original. Keratoconus Formatting of this note might be different from the original. Formatting of this note might be different from the original. Keratoconus Formatting of this note might be different from the or   Microhematuria 06/27/2015   Migraine 08/28/2014   Formatting of this note might be different from the original. Migraine Formatting of this note might be different from the original. Formatting of this note might be different from the original. Migraine Formatting of this note might be different from the original. Migraine Formatting of this note might be different from the original. Migraine Formatting of this note might be different from the or   Migraine without aura and without status migrainosus, not intractable 08/28/2014   Formatting of this note might be different from the original. Formatting of this note might be different from the original. Formatting of this note might be different from the original. Migraine Formatting of this note might be different from the original. Migraine Formatting of this note might be different from the original. Formatting of this note might be different from the original. Migraine F   Mild intermittent asthma without complication 10/30/2008   Formatting of this note might be different from the original. Formatting of this note might be different from the original. Formatting of this note might be different from the original. Asthma Formatting of  this note might be different from the original. Asthma Formatting of this note might be different from the original. Formatting of this note might be different from the original. 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 Formatting of this note might be different from the original. 01/25/2016 with Dr. Loney Pol at [facility]. [place] of this note might be different from the original. 01/25/2016 with Dr. Loney Pol at [facility]. [place] of this note might be different from the original. Formatting of this note might be different from    Seronegative inflammatory arthritis 08/28/2014   Formatting of this note might be different from the original. Formatting of this note might be different from the original. Arthritis; diagnosed as IBD related - 2012 Formatting of this note might be  different from the original. Followed by rheumatologist. Had to change providers due to an insurance change earlier this year resulting in patient not being able to have Remicade  for ~6 months. 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 Formatting of this note might be different from the original. Treated with PRN miralax . Recent  colonoscopy 07/2019. Formatting of this note might be different from the original. Treated with PRN miralax . Recent colonoscopy 07/2019. Formatting of this note might be different from the original. Formatting of this note might be different f   Status post placement of ureteral stent 02/06/2021   Tooth infection 08/12/2020   Unstable angina (HCC) 07/29/2023   Ureteral stone 01/20/2022   Urinary tract infection, site not specified 06/26/2022    Patient Active Problem List   Diagnosis Date Noted   Irritable bowel syndrome with constipation 07/10/2024   Iron  deficiency anemia 06/08/2024   Dizziness 02/13/2024   Skin rash 02/13/2024   Other chronic pain 12/14/2023   Hypokalemia 09/21/2023   Bilateral lower extremity edema 09/16/2023   Unstable angina (HCC) 07/29/2023   Moderate persistent asthma 07/29/2023   History of DVT (deep vein thrombosis) 06/30/2023   Sleep related hypoxia 06/13/2023   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   Coronary artery disease s/p PCI/DES to LAD 07/01/2023 12/28/2022   Hyperlipidemia 10/07/2022   Hiatal hernia 10/07/2022   Esophageal candidiasis (HCC) 06/26/2022   Rheumatoid arthritis of multiple sites with 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 factor (HCC) 11/16/2021   De Quervain's tenosynovitis, left 07/31/2021   Status post placement of ureteral stent 02/06/2021   Proteinuria 08/12/2020   Environmental allergies 08/12/2020   History of kidney stones 08/12/2020   Gastroesophageal reflux disease with esophagitis without hemorrhage 08/01/2019   Slow transit constipation 08/01/2019   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   History of colon polyps 06/19/2019   Irregular astigmatism of both eyes 10/11/2018   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   Myopia with astigmatism and presbyopia 05/07/2016   Overactive bladder 11/18/2015   Incontinence 10/07/2015   Essential hypertension 06/27/2015   Cataract  05/06/2015   Dry eyes 05/06/2015   Crohn's disease (HCC) 04/21/2015   Osteoarthritis of both knees 04/21/2015   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/30/2014   History of abnormal cervical Pap smear 11/29/2014   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   Migraine without aura and without status migrainosus, not intractable 08/28/2014   Migraine 08/28/2014   Seronegative inflammatory arthritis 08/28/2014   Increased urinary frequency 07/16/2013   Dysfunction of both eustachian tubes 08/31/2012   Asthma 10/30/2008    Past Surgical History:  Procedure Laterality Date   BACK SURGERY     L5-S1   CATARACT EXTRACTION Left    CERVICAL CONE BIOPSY  04/2000   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   CORONARY STENT INTERVENTION N/A 07/01/2023   Procedure: CORONARY STENT INTERVENTION;  Surgeon: Darron Deatrice LABOR, MD;  Location: [facility];  Service: Cardiovascular;  Laterality: N/A;   EYE SURGERY     GASTRIC BYPASS  2017   [place]   LAPAROSCOPIC GASTRIC SLEEVE RESECTION     LEFT HEART CATH AND CORONARY ANGIOGRAPHY N/A 07/01/2023   Procedure: LEFT HEART CATH AND CORONARY ANGIOGRAPHY;  Surgeon: Darron Deatrice LABOR, MD;  Location: [facility];  Service: Cardiovascular;  Laterality: N/A;   LEFT HEART CATH AND CORONARY ANGIOGRAPHY N/A 08/01/2023  Procedure: LEFT HEART CATH AND CORONARY ANGIOGRAPHY;  Surgeon: Ladona Heinz, MD;  Location: [facility];  Service: Cardiovascular;  Laterality: N/A;   Low back disc surgery     06/2000   TOTAL KNEE ARTHROPLASTY Right 11/2016    OB History     Gravida  0   Para  0   Term  0   Preterm  0   AB  0   Living  0      SAB  0   IAB  0   Ectopic  0   Multiple  0   Live Births  0            Home Medications    Prior to Admission medications  Medication Sig Start Date End Date Taking? Authorizing Provider  acetaminophen  (TYLENOL ) 500 MG tablet Take 1,000 mg by mouth every 12 (twelve) hours as needed for mild pain or  moderate pain.    [provider]  albuterol  (PROVENTIL ) (2.5 MG/3ML) 0.083% nebulizer solution Take 3 mLs (2.5 mg total) by nebulization every 4 (four) hours as needed for wheezing or shortness of breath. 04/18/24   Hunsucker, Donnice SAUNDERS, MD  aMILoride (MIDAMOR) 5 MG tablet Take 10 mg by mouth daily. 03/18/24   [provider]  atorvastatin  (LIPITOR) 40 MG tablet Take 2 tablets (80 mg total) by mouth daily. 01/19/24   Tolia, Sunit, DO  B Complex-C (B-COMPLEX WITH VITAMIN C) tablet Take 1 tablet by mouth daily. 09/06/19   [provider]  Cholecalciferol  (VITAMIN D3) 50 MCG (2000 UT) TABS Take 1 tablet by mouth daily.    [provider]  diphenhydramine -acetaminophen  (TYLENOL  PM) 25-500 MG TABS tablet Take 1 tablet by mouth at bedtime as needed.    [provider]  EPINEPHrine  (EPIPEN  2-PAK) 0.3 mg/0.3 mL IJ SOAJ injection Inject 0.3 mg into the muscle as needed for anaphylaxis. 04/24/24   Cari Arlean HERO, FNP  Evolocumab  (REPATHA  SURECLICK) 140 MG/ML SOAJ Inject 140 mg into the skin every 14 (fourteen) days. 12/27/23   Tolia, Sunit, DO  famotidine  (PEPCID ) 40 MG tablet Take 40 mg by mouth 2 (two) times daily. Patient taking differently: Take 40 mg by mouth daily. 12/20/23 12/19/24  [provider]  levocetirizine (XYZAL) 5 MG tablet Take 10 mg by mouth daily.    [provider]  losartan (COZAAR) 50 MG tablet Take 50 mg by mouth daily.    [provider]  lubiprostone (AMITIZA) 8 MCG capsule Take 8 mcg by mouth 2 (two) times daily.    [provider]  montelukast  (SINGULAIR ) 10 MG tablet Take 1 tablet (10 mg total) by mouth daily. 04/24/24   Cari Arlean HERO, FNP  mupirocin  ointment (BACTROBAN ) 2 % Apply 1 Application topically 2 (two) times daily. 07/11/24   Christine Rush, DPM  nitroGLYCERIN  (NITROSTAT ) 0.4 MG SL tablet Place 1 tablet (0.4 mg total) under the tongue every 5 (five) minutes as needed for chest pain. 12/13/23   Tolia, Sunit,  DO  oxyCODONE  (OXY IR/ROXICODONE ) 5 MG immediate release tablet Take 1 tablet (5 mg total) by mouth at bedtime. 07/03/24   Kirsteins, Prentice BRAVO, MD  RABEprazole (ACIPHEX) 20 MG tablet Take 20 mg by mouth 2 (two) times daily.    [provider]  Secukinumab (COSENTYX SENSOREADY PEN) 150 MG/ML SOAJ Inject 150 mg into the skin. 03/01/24   [provider]  ticagrelor  (BRILINTA ) 90 MG TABS tablet Take 1 tablet (90 mg total) by mouth 2 (  two) times daily. 01/12/24   Tolia, Sunit, DO  triamcinolone  cream (KENALOG ) 0.1 % Apply 1 Application topically 2 (two) times daily. Patient taking differently: Apply 1 Application topically as needed. 04/09/24   Burnette, Jennifer M, PA-C  triamcinolone  ointment (KENALOG ) 0.1 % Apply 1 Application topically 2 (two) times daily. 07/17/24 07/17/25  [provider]    Family History Family History  Adopted: Yes  Problem Relation Age of Onset   Diabetes Mother    Hypertension Mother    Diabetes Father    Hypertension Father    Hypertension Sister    Diabetes Sister    Hypertension Brother    Diabetes Brother    Diabetes Paternal Grandmother    Cancer Other    Asthma Neg Hx    Allergic rhinitis Neg Hx    Atopy Neg Hx    Eczema Neg Hx     Social History Social History[1]   Allergies   Albuterol  sulfate, Corticosteroids, Silicone, Tape, Wound dressings, Formoterol, Hydrocodone , Hydrocodone -acetaminophen , Nickel, Other, Oxycodone -acetaminophen , Salmeterol, Strawberry extract, Advair hfa [fluticasone-salmeterol], Proventil  hfa [albuterol ], Asa [aspirin ], Cleocin [clindamycin], Clindamycin/lincomycin, Fludrocortisone acetate, Gabapentin, Hydrochlorothiazide, Imipramine, Ipratropium bromide, Medroxyprogesterone, Meloxicam, Metformin and related, Nystatin, Penicillins, Pred forte [prednisolone], Prednisone, Solu-medrol  [methylprednisolone ], Sulfa antibiotics, Tegaderm ag mesh [silver], Toradol  [ketorolac  tromethamine ], Tramadol, and Venofer  [iron   sucrose]   Review of Systems Review of Systems  Constitutional:  Negative for chills and fever.  HENT:  Positive for sore throat and voice change. Negative for ear pain.   Eyes:  Negative for pain and visual disturbance.  Respiratory:  Negative for cough and shortness of breath.   Cardiovascular:  Negative for chest pain and palpitations.  Gastrointestinal:  Negative for abdominal pain, constipation, diarrhea, nausea and vomiting.  Genitourinary:  Negative for dysuria and hematuria.  Musculoskeletal:  Negative for arthralgias and back pain.  Skin:  Negative for color change and rash.  Neurological:  Negative for seizures and syncope.  All other systems reviewed and are negative.    Physical Exam Triage Vital Signs ED Triage Vitals  Encounter Vitals Group     BP 08/03/24 1222 129/83     Girls Systolic BP Percentile --      Girls Diastolic BP Percentile --      Boys Systolic BP Percentile --      Boys Diastolic BP Percentile --      Pulse Rate 08/03/24 1222 76     Resp 08/03/24 1222 16     Temp 08/03/24 1222 98.3 F (36.8 C)     Temp Source 08/03/24 1222 Oral     SpO2 08/03/24 1222 98 %     Weight --      Height --      Head Circumference --      Peak Flow --      Pain Score 08/03/24 1218 8     Pain Loc --      Pain Education --      Exclude from Growth Chart --    No data found.  Updated Vital Signs BP 129/83 (BP Location: Right Arm)   Pulse 76   Temp 98.3 F (36.8 C) (Oral)   Resp 16   SpO2 98%   Visual Acuity Right Eye Distance:   Left Eye Distance:   Bilateral Distance:    Right Eye Near:   Left Eye Near:    Bilateral Near:     Physical Exam Vitals and nursing note reviewed.  Constitutional:      General: She  is not in acute distress.    Appearance: She is well-developed. She is not ill-appearing or toxic-appearing.  HENT:     Head: Normocephalic and atraumatic.     Right Ear: Hearing, tympanic membrane, ear canal and external ear normal.      Left Ear: Hearing, tympanic membrane, ear canal and external ear normal.     Nose: No congestion or rhinorrhea.     Right Sinus: No maxillary sinus tenderness or frontal sinus tenderness.     Left Sinus: No maxillary sinus tenderness or frontal sinus tenderness.     Mouth/Throat:     Lips: Pink.     Mouth: Mucous membranes are moist.     Dentition: Abnormal dentition (Patient has several missing teeth in her front upper jaw.).     Tongue: Lesions (White plaque on tongue that does not remove with scraping.) present.     Pharynx: Uvula midline. Posterior oropharyngeal erythema present. No oropharyngeal exudate.     Tonsils: No tonsillar exudate (Minimal erythema, no enlargement and no exudate).  Eyes:     Conjunctiva/sclera: Conjunctivae normal.     Pupils: Pupils are equal, round, and reactive to light.  Cardiovascular:     Rate and Rhythm: Normal rate and regular rhythm.     Heart sounds: S1 normal and S2 normal. No murmur heard. Pulmonary:     Effort: Pulmonary effort is normal. No respiratory distress.     Breath sounds: Normal breath sounds. No decreased breath sounds, wheezing, rhonchi or rales.  Abdominal:     General: Bowel sounds are normal.     Palpations: Abdomen is soft.     Tenderness: There is no abdominal tenderness.  Musculoskeletal:        General: No swelling.     Cervical back: Neck supple.  Lymphadenopathy:     Head:     Right side of head: No submental, submandibular, tonsillar, preauricular or posterior auricular adenopathy.     Left side of head: No submental, submandibular, tonsillar, preauricular or posterior auricular adenopathy.     Cervical: Cervical adenopathy present.     Right cervical: Superficial cervical adenopathy present.     Left cervical: Superficial cervical adenopathy present.  Skin:    General: Skin is warm and dry.     Capillary Refill: Capillary refill takes less than 2 seconds.     Findings: No rash.  Neurological:     Mental Status:  She is alert and oriented to person, place, and time.  Psychiatric:        Mood and Affect: Mood normal.      UC Treatments / Results  Labs (all labs ordered are listed, but only abnormal results are displayed) Labs Reviewed  POCT RAPID STREP A (OFFICE) - Normal  CULTURE, GROUP A STREP [facility])    EKG   Radiology No results found.  Procedures Procedures (including critical care time)  Medications Ordered in UC Medications - No data to display  Initial Impression / Assessment and Plan / UC Course  I have reviewed the triage vital signs and the nursing notes.  Pertinent labs & imaging results that were available during my care of the patient were reviewed by me and considered in my medical decision making (see chart for details).  Plan of Care (see discharge instructions for additional patient precautions and education): Sore throat: Rapid strep is negative.  Throat culture sent.  Will adjust the plan of care, if needed once the culture results.  No antibiotics for now.  Oral thrush: Patient has fluconazole  that she was given on 08/01/2024 from infectious disease.  She may use the fluconazole  as needed.  Follow-up with primary care or infectious disease as needed.  Return here if needed.  Patient declined discharge paperwork.  I reviewed the plan of care with the patient and/or the patient's guardian.  The patient and/or guardian had time to ask questions and acknowledged that the questions were answered.  Final Clinical Impressions(s) / UC Diagnoses   Final diagnoses:  Sore throat  Oral thrush     Discharge Instructions      Sore throat: Rapid strep is negative.  Throat culture sent.  Will adjust the plan of care, if needed once the culture results.  No antibiotics for now.  Oral thrush: Patient has fluconazole  that she was given on 08/01/2024 from infectious disease.  She may use the fluconazole  as needed.  Follow-up with primary care or infectious disease as  needed.  Return here if needed.  Patient declined discharge paperwork.     ED Prescriptions   None    PDMP not reviewed this encounter.    [1]  Social History Tobacco Use   Smoking status: Never    Passive exposure: Never   Smokeless tobacco: Never  Vaping Use   Vaping status: Never Used  Substance Use Topics   Alcohol use: Yes    Comment: rare   Drug use: Never     Ival Domino, FNP 08/03/24 1326  "

## 2024-08-06 ENCOUNTER — Ambulatory Visit (HOSPITAL_COMMUNITY): Payer: Self-pay

## 2024-08-06 LAB — CULTURE, GROUP A STREP (THRC)

## 2024-08-07 ENCOUNTER — Telehealth: Payer: Self-pay | Admitting: Physical Medicine & Rehabilitation

## 2024-08-07 ENCOUNTER — Telehealth: Payer: Self-pay | Admitting: Registered Nurse

## 2024-08-07 ENCOUNTER — Ambulatory Visit (HOSPITAL_BASED_OUTPATIENT_CLINIC_OR_DEPARTMENT_OTHER): Admitting: Student

## 2024-08-07 ENCOUNTER — Ambulatory Visit (INDEPENDENT_AMBULATORY_CARE_PROVIDER_SITE_OTHER): Admitting: Neurology

## 2024-08-07 ENCOUNTER — Encounter (HOSPITAL_BASED_OUTPATIENT_CLINIC_OR_DEPARTMENT_OTHER): Payer: Self-pay | Admitting: Student

## 2024-08-07 ENCOUNTER — Encounter: Payer: Self-pay | Admitting: Neurology

## 2024-08-07 VITALS — BP 138/77 | HR 74 | Temp 98.5°F | Resp 16 | Ht 63.0 in | Wt 219.6 lb

## 2024-08-07 VITALS — BP 129/82 | HR 91 | Ht 63.0 in | Wt 220.0 lb

## 2024-08-07 DIAGNOSIS — R404 Transient alteration of awareness: Secondary | ICD-10-CM | POA: Insufficient documentation

## 2024-08-07 DIAGNOSIS — G43709 Chronic migraine without aura, not intractable, without status migrainosus: Secondary | ICD-10-CM

## 2024-08-07 DIAGNOSIS — M545 Low back pain, unspecified: Secondary | ICD-10-CM

## 2024-08-07 DIAGNOSIS — E119 Type 2 diabetes mellitus without complications: Secondary | ICD-10-CM

## 2024-08-07 DIAGNOSIS — R079 Chest pain, unspecified: Secondary | ICD-10-CM

## 2024-08-07 DIAGNOSIS — R5383 Other fatigue: Secondary | ICD-10-CM

## 2024-08-07 DIAGNOSIS — E66812 Obesity, class 2: Secondary | ICD-10-CM | POA: Diagnosis not present

## 2024-08-07 DIAGNOSIS — E785 Hyperlipidemia, unspecified: Secondary | ICD-10-CM | POA: Diagnosis not present

## 2024-08-07 DIAGNOSIS — M542 Cervicalgia: Secondary | ICD-10-CM

## 2024-08-07 DIAGNOSIS — Z6838 Body mass index (BMI) 38.0-38.9, adult: Secondary | ICD-10-CM

## 2024-08-07 DIAGNOSIS — K2100 Gastro-esophageal reflux disease with esophagitis, without bleeding: Secondary | ICD-10-CM

## 2024-08-07 DIAGNOSIS — J4540 Moderate persistent asthma, uncomplicated: Secondary | ICD-10-CM

## 2024-08-07 DIAGNOSIS — L300 Nummular dermatitis: Secondary | ICD-10-CM | POA: Diagnosis not present

## 2024-08-07 DIAGNOSIS — G894 Chronic pain syndrome: Secondary | ICD-10-CM

## 2024-08-07 DIAGNOSIS — G8929 Other chronic pain: Secondary | ICD-10-CM

## 2024-08-07 DIAGNOSIS — R808 Other proteinuria: Secondary | ICD-10-CM

## 2024-08-07 MED ORDER — BUTALBITAL-APAP-CAFFEINE 50-325-40 MG PO TABS: 1.0000 | ORAL_TABLET | Freq: Four times a day (QID) | ORAL | 5 refills | Status: AC | PRN

## 2024-08-07 MED ORDER — OXYCODONE HCL 5 MG PO TABS: 5.0000 mg | ORAL_TABLET | Freq: Every day | ORAL | 0 refills | Status: DC

## 2024-08-07 NOTE — Progress Notes (Signed)
 Negative.  No strep nor other organisms.  She did have thrush and was seeing Infectious Disease about the thrush.

## 2024-08-07 NOTE — Progress Notes (Signed)
 "  Established Patient Office Visit  Subjective   Patient ID: Casey [name], female    DOB: [birth year]  Age: 53 y.o. MRN: [id number]  Chief Complaint  Patient presents with   Medical Management of Chronic Issues    Follow up.  Seeing neuro later this afternoon. Pt needs lipid order done here. Was ordered from a different provider. Did not fast today.     HPI  Discussed the use of AI scribe software for clinical note transcription with the patient, who gave verbal consent to proceed.  History of Present Illness   Casey [name] is a 53 year old medically complex female who presents for a follow-up on her overall condition.  Her fatigue persists despite improved iron  levels and iron  stores. As discussed prior, her fatigue  this is almost certainly multifactorial- History of inflammatory arthritis, sleep related hypoxemia, postmenopausal (not a good candidate for estrogen), IDA, History of gastric surgery, IBS with possible pain related fatigue, etc. In such a complex patient, it would be hard to pinpoint a single etiology causing fatigue. She does have low levels of B12, she is searching for a tolerable oral B12 supplement, as previous attempts have caused gagging. Sleep study scheduled for early February to evaluate for hypoxemia.   She has been experiencing a persistent rash since April, initially presenting as small spots and now spreading to cover her stomach and back, with itching on her feet and redness on her hands. She has begun seeing immunodermatology with [facility] and they believe this to be nummular eczema versus psoriasis. She is using triamcinolone  steroid cream for the rash.  She experiences dizziness at least twice a week, often after bending or walking, making her feel like she might pass out, prompting her to sit down to prevent falling. This is a chronic issue and she is being evaluated by almost every specialty. ENT ruled that this was not an inner ear issue. She is scheduled to  see a neurologist for further evaluation.  She has a history of type 2 diabetes, currently diet-controlled, with the last A1c check in September revealed A1c of 6.1 indicating that she is well controlled on diet alone. DM foot exam has been done. Urine albumins are checked periodically through nephro. Up to date on Ophthalmology exam- follows with Ophthalmology for history of corneal transplant and cataracts  She is due for a colonoscopy, which has been delayed due to COVID-19 and her inability to tolerate the prep. She is managing Crohn's disease and is scheduled to see her GI specialist in January.  She has a history of high triglycerides and is due for a fasting cholesterol panel, as her last test was a year ago. She is managing her blood pressure and is considering changing her cardiologist due to previous unsatisfactory experiences.      Medically Complex Patient: List of Specialists: Heme/onc- Eleanor Bach, NP- IDA Infectious Disease- Rosina Schilling, MD- Oral/Esophageal Candidiasis (recurrent w/ IV antifungals but not currently active problem) Physical Medicine and Rehab- Fidela Hummer, NP- Chronic Pain Immuno-Dermatology- Lucie Real, MD- Nummular Eczema vs Psoriasis Rheumatology- Dr. Deitra Space Cheema- Inflammatory Arthritis/Spondyloarthropathy vs Psoriatic Arthritis (history biologics use) Ophthlamology- Ivonne Darnel, MD- History corneal transplant, Stable Keratoconus Cardiology- Sunit Tolia, DO- CAD, Chest Pain Pulmonology- Donnice Beals, MD- Asthma, Chronic respiratory failure (2L nightly o2) Nephrology- Proteinuria/Persistent Hypokalemia  Patient Active Problem List   Diagnosis Date Noted   Alteration consciousness 08/07/2024   Irritable bowel syndrome with constipation 07/10/2024   Iron  deficiency  anemia 06/08/2024   Dizziness 02/13/2024   Skin rash 02/13/2024   Other chronic pain 12/14/2023   Hypokalemia 09/21/2023   Bilateral lower extremity edema  09/16/2023   History of DVT (deep vein thrombosis) 06/30/2023   Sleep related hypoxia 06/13/2023   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   Coronary artery disease s/p PCI/DES to LAD 07/01/2023 12/28/2022   Dyslipidemia 10/07/2022   Hiatal hernia 10/07/2022   Esophageal candidiasis (HCC) 06/26/2022   Rheumatoid arthritis of multiple sites with 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 factor (HCC) 11/16/2021   Status post placement of ureteral stent 02/06/2021   Proteinuria 08/12/2020   Environmental allergies 08/12/2020   History of kidney stones 08/12/2020   Gastroesophageal reflux disease with esophagitis without hemorrhage 08/01/2019   Slow transit constipation 08/01/2019   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   History of colon polyps 06/19/2019   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   Overactive bladder 11/18/2015   Incontinence 10/07/2015   Dry eyes 05/06/2015   Crohn's disease (HCC) 04/21/2015   Diabetes mellitus type 2, diet-controlled (HCC) 04/21/2015   Osteoarthritis of both knees 04/21/2015   History of abnormal cervical Pap smear 11/29/2014   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   Chronic migraine w/o aura w/o status migrainosus, not intractable 08/28/2014   Migraine 08/28/2014   Seronegative inflammatory arthritis 08/28/2014   Dysfunction of both eustachian tubes 08/31/2012   Moderate persistent asthma 10/30/2008   Past Medical History:  Diagnosis Date   Albuminuria 06/27/2015   Anxiety 11/04/2014   Arm DVT (deep venous thromboembolism), acute, left (HCC) 08/17/2022   Atopic dermatitis 10/30/2008   Coronary artery disease s/p PCI/DES to LAD 07/01/2023 12/28/2022   Crohn's disease (HCC) 04/21/2015   Diabetes type 2, controlled (HCC) 04/21/2015   Esophageal candidiasis (HCC) 06/26/2022   Fibrositis 08/28/2014   Hiatal hernia 10/07/2022   History of abnormal cervical Pap smear 11/29/2014   History of colon polyps 06/19/2019    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   History of kidney stones 08/12/2020   Hypercholesterolemia 10/07/2022   Hypertension, essential, benign 06/27/2015   Irregular astigmatism of both eyes 10/11/2018   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   Migraine without aura and without status migrainosus, not intractable 08/28/2014   Mild intermittent asthma without complication 10/30/2008   Morbid obesity with BMI of 40.0-44.9, adult (HCC) 03/31/2022   Myopia with astigmatism and presbyopia 05/07/2016   Nausea and vomiting 09/27/2022   Nephrolithiasis 09/08/2018   Obstructive sleep apnea 12/12/2013   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 Formatting of this note might be different from the original. 01/25/2016 with Dr. Loney Pol at [facility]. [place] of this note might be different from the original. 01/25/2016 with Dr. Loney Pol at [facility]. [place] of this note might be different from the original. Formatting of this note might be different from    Slow transit constipation 08/01/2019   Formatting of this note might be different from the original. Formatting of this note might be different from the original. Treated with PRN miralax . Recent colonoscopy 07/2019. Formatting of this note might be different from the original. Treated with PRN miralax . Recent colonoscopy 07/2019. Formatting of this note might be different from the original. Formatting of this note might be different f   Status  post placement of ureteral stent 02/06/2021   Social History[1] Allergies[2]    ROS Per HPI.    Objective:     BP 138/77   Pulse 74   Temp 98.5 F (36.9 C) (Oral)   Resp 16   Ht 5' 3 (1.6 m)   Wt 219 lb 9.6 oz (99.6 kg)   SpO2 98%    BMI 38.90 kg/m  BP Readings from Last 3 Encounters:  08/10/24 133/79  08/07/24 129/82  08/07/24 138/77   Wt Readings from Last 3 Encounters:  08/07/24 220 lb (99.8 kg)  08/07/24 219 lb 9.6 oz (99.6 kg)  07/24/24 219 lb (99.3 kg)   SpO2 Readings from Last 3 Encounters:  08/10/24 98%  08/07/24 98%  08/03/24 98%      Physical Exam Constitutional:      General: She is not in acute distress.    Appearance: Normal appearance. She is not ill-appearing.  HENT:     Head: Normocephalic and atraumatic.     Nose: Nose normal.  Eyes:     General: No scleral icterus.    Conjunctiva/sclera: Conjunctivae normal.  Cardiovascular:     Rate and Rhythm: Normal rate and regular rhythm.     Heart sounds: Normal heart sounds. No murmur heard.    No friction rub.  Pulmonary:     Effort: Pulmonary effort is normal. No respiratory distress.     Breath sounds: Normal breath sounds. No wheezing, rhonchi or rales.  Musculoskeletal:        General: Normal range of motion.  Skin:    General: Skin is warm and dry.     Coloration: Skin is not jaundiced or pale.  Neurological:     General: No focal deficit present.     Mental Status: She is alert.  Psychiatric:        Mood and Affect: Mood normal.        Behavior: Behavior normal.      No results found for any visits on 08/07/24.  Last CBC Lab Results  Component Value Date   WBC 10.5 07/24/2024   HGB 13.4 07/24/2024   HCT 43.5 07/24/2024   MCV 83.0 07/24/2024   MCH 25.6 (L) 07/24/2024   RDW 22.9 (H) 07/24/2024   PLT 341 07/24/2024   Last metabolic panel Lab Results  Component Value Date   GLUCOSE 131 (H) 07/24/2024   NA 141 07/24/2024   K 3.5 07/24/2024   CL 106 07/24/2024   CO2 23 07/24/2024   BUN 10 07/24/2024   CREATININE 0.89 07/24/2024   GFRNONAA >60 07/24/2024   CALCIUM  9.6 07/24/2024   PROT 7.6 07/24/2024   ALBUMIN 4.1 07/24/2024   LABGLOB 4.3 (H) 06/08/2024   BILITOT 0.4 07/24/2024   ALKPHOS 122 07/24/2024   AST  23 07/24/2024   ALT 16 07/24/2024   ANIONGAP 12 07/24/2024   Last lipids Lab Results  Component Value Date   CHOL 249 (H) 09/06/2023   HDL 73 09/06/2023   LDLCALC 140 (H) 09/06/2023   TRIG 205 (H) 09/06/2023   CHOLHDL 3.4 09/06/2023   Last hemoglobin A1c Lab Results  Component Value Date   HGBA1C 6.1 (H) 05/08/2024      The 10-year ASCVD risk score (Arnett DK, et al., 2019) is: 5.3%    Assessment & Plan:   Assessment and Plan    Chronic fatigue Estrogen therapy not recommended due to clot risk although postmenopausal estrogen def also likely plays a part. All in all this  is almost certainly multifactorial- History of inflammatory arthritis, sleep related hypoxemia, postmenopausal, IDA, History of gastric surgery, IBS with possible pain related fatigue, Crohn's (?), etc. In such a complex patient, it would be hard to pinpoint a single etiology causing fatigue.  - Still working on finding a liquid oral formulation that she likes so that she can supplement  Chronic dermatitis (eczema vs. psoriasis) Following with dermatology for Chronic dermatitis with differential diagnosis of eczema versus psoriasis. Rash initially localized but now widespread, including stomach, back, feet, and hands. Current treatment with triamcinolone  steroid cream and ammonium lactate lotion. Unclear if treatment is effective due to persistent rash. - Continue triamcinolone  steroid cream for possible eczema or psoriasis. - Continue ammonium lactate lotion for foot fissure. - Follow up with immunodermatology in May.  Type 2 diabetes mellitus, diet-controlled Chronic, stable. Type 2 diabetes mellitus managed with diet control. Last A1c was 6.1 in September, indicating stable control. - Will reassess A1c in three months. - Continue diet-controlled management.  Dyslipidemia Previous high triglycerides. Last cholesterol panel was a year ago. Fasting cholesterol panel needed for accurate assessment. -  Ordered fasting cholesterol panel. - Coordinate with cardiology for results.  Gastroesophageal reflux disease Chronic, stable with no current issues noted.  - Continue current medications for GERD.  Recurrent Oral candidiasis Recurrent oral candidiasis. Recent negative swab for thrush. Follow-up with infectious disease as needed. - Continue follow-up with infectious disease as needed.  Obesity (BMI 38.97 with multiple comorbidities) History of gastric bypass. Exercise limited by history of chest pain, inflammatory arthritis, and chronic dizziness (under investigation). - We can continue to work on diet, she is doing well in controlling her diabetes with diet  General health maintenance Discussion of vaccinations and screenings. Hepatitis B vaccination likely completed. Shingles vaccine deferred due to patient concern. Colonoscopy overdue due to previous issues with colon prep. - Discuss shingles vaccine- should be fine for shingrix on immunotherapy - Schedule colonoscopy with GI in January.     Reviewed Dermatology note from 07/17/24 Reviewed Rheumatology note from 05/30/24 Reviewed ID note from 08/01/24 Reviewed Hematology note from 07/24/24  Return in about 3 months (around 11/05/2024) for Chronic Followup.    Blain Hunsucker T Beckett Maden, PA-C     [1]  Social History Tobacco Use   Smoking status: Never    Passive exposure: Never   Smokeless tobacco: Never  Vaping Use   Vaping status: Never Used  Substance Use Topics   Alcohol use: Yes    Comment: rare   Drug use: Never  [2]  Allergies Allergen Reactions   Albuterol  Sulfate Shortness Of Breath    Other reaction(s): Other (See Comments)  can't breathe  Other reaction(s): Other (See Comments) can't breathe   Corticosteroids Dermatitis, Palpitations and Swelling    ABLE TO TOLERATE VIA IM AND IV Inhaled - HA    ocular irritation   Silicone Dermatitis and Other (See Comments)    Cast and Bandage Cover  severe skin breakdown  Cast and Bandage Cover  Cannot tolerate tegaderm, needs IV 3000. Paper tape.  Rash after several hours of EKG leads, but willing to tolerate.  Can tolerate silk tape for short stretches.  severe skin breakdown  severe skin breakdown Cast and Bandage Cover   Tape     Other reaction(s): Other (See Comments), Other (See Comments), Other (See Comments)  severe skin breakdown  Cast and Bandage Cover  severe skin breakdown  severe skin breakdown   Wound Dressings Dermatitis, Hives and Other (See Comments)  severe skin breakdown Cast and Bandage Cover  severe skin breakdown  severe skin breakdown Cast and Bandage Cover  Cannot tolerate tegaderm, needs IV 3000. Paper tape.  Rash after several hours of EKG leads, but willing to tolerate.  Can tolerate silk tape for short stretches.  Cast and Bandage Cover   Formoterol Other (See Comments)    Other reaction(s): Headaches  Other reaction(s): Headaches  Other reaction(s): Headaches  Headaches  Other reaction(s): Headaches Other reaction(s): Headaches    Other reaction(s): Headaches    Headaches   Hydrocodone  Rash   Hydrocodone -Acetaminophen  Rash   Nickel Rash and Other (See Comments)   Other Rash    cast and bandage use  cast and bandage use  cast and bandage use  cast and bandage use    cast and bandage use cast and bandage use   Oxycodone -Acetaminophen  Other (See Comments)    headache   Salmeterol Rash   Strawberry Extract Hives    Full body rash from strawberry   Advair Hfa [Fluticasone-Salmeterol]     unknown   Proventil  Hfa [Albuterol ]     unknown   Asa [Aspirin ] Rash   Cleocin [Clindamycin] Rash   Clindamycin/Lincomycin Rash   Fludrocortisone Acetate Rash   Gabapentin Rash   Hydrochlorothiazide Rash   Imipramine Rash   Ipratropium Bromide Rash   Medroxyprogesterone Rash   Meloxicam Rash   Metformin And Related Rash   Nystatin Rash   Penicillins Rash   Pred Forte [Prednisolone] Rash   Prednisone Rash   Solu-Medrol   [Methylprednisolone ] Rash   Sulfa Antibiotics Rash   Tegaderm Ag Mesh [Silver] Rash   Toradol  [Ketorolac  Tromethamine ] Rash   Tramadol Rash   Venofer  [Iron  Sucrose] Rash   "

## 2024-08-07 NOTE — Progress Notes (Signed)
 "  Chief Complaint  Patient presents with   New Patient (Initial Visit)    Pt in room 14. Alone. Internal referral for Dizziness.      ASSESSMENT AND PLAN  Casey [name] is a 53 y.o. female   Her reported passing out episode, frequent dizziness, heart palpitation with positional change, rapid heart rate  Mostly suggest of syncope/presyncope due to tachycardia, hypoperfusion of the brain  This can be related to her long-term pre-diabetes, obesity, deconditioning, she denies lower extremity paresthesia, no evidence of peripheral neuropathy  Suggest increase water intake, moderate exercise,  She does want to proceed with more extensive neurological evaluation to rule out central nervous system structural abnormality, MRI of the brain, EEG, Chronic migraine  not a candidate for triptans due to her chronic artery disease  As needed Fioricet     DIAGNOSTIC DATA (LABS, IMAGING, TESTING) - I reviewed patient records, labs, notes, testing and imaging myself where available.   MEDICAL HISTORY:  Casey [name] is a 53 year old female, seen in request by her primary care from [facility]  Rothfuss, Jacob for evaluation of passing out episode, frequent headaches, initial evaluation August 07, 2024    History is obtained from the patient and review of electronic medical records. I personally reviewed pertinent available imaging films in PACS.   PMHx of  HLD Obesity HTN Asthma CAD, s/p stent Esophageal candidiasis Gastric sleeve in 2017, lost 100 Lb RA, on Cosentyx since July 2025,  Lumbar decompression L5-S1 in 2001, presented low back pain, left lumbar radiculopathy, Right knee replacement in 2018 Corneal transplant  She had a long history of chronic migraine, typical migraine severe retro-orbital area pounding headache with light, noise sensitivity, nauseous, used to take Zomig  as needed, no longer a candidate because of her history of coronary artery disease, status  post stent  She has occasional headache now, about couple times each month, less severe, but with light sensitivity, sleep helps, open proceeding with dizziness  She also described to passing out episode, first in February 2025, she got up from a seated position, without warning signs, fell face down, had facial laceration requiring stitches,  Second episode was June 2025, she was out taking care of her trash can, suddenly felt sweaty, had palpitation, going to pass out, sat down for couple minutes, symptoms gradually improved  Today orthostatic blood pressure measurement did not show significant blood pressure drop, but she did have significant elevation of heart rate from baseline of 76 to 91 when standing up, she felt mild lightheadedness  She had long history of obesity, maintained 100 pound weight loss since her gastric bypass surgery in 2017, previously prediabetes, improved with weight loss, she works health and safety inspector job, geographical information systems officer, sit down most of the time, occasionally water aerobics, which has been limited due to her frequent dizziness with positional change, such as getting up from sitting position  Lab from December 2025: Hemoglobin of 13.4, normal CMP with exception of mild elevation of glucose 131, ferritin was 7 2 months ago with supplement improved to 31, normal folic acid , low normal B12 400 normal TSH, protein electrophoresis, A1c was 6.1,   PHYSICAL EXAM:   Vitals:   08/07/24 1551  BP: 129/82  Pulse: 91  Weight: 220 lb (99.8 kg)  Height: 5' 3 (1.6 m)   Orthostatic Vitals for the past 48 hrs (Last 6 readings):  Patient Position BP Pulse BP Location Cuff Size Patient Position (if appropriate) BP- Standing at 0  minutes Pulse- Standing at 0 minutes BP- Lying Pulse- Lying  08/07/24 1551 Sitting 129/82 91 Right Arm Normal -- -- -- -- --  08/07/24 1552 -- -- -- -- -- Orthostatic Vitals 136/89 82 131/85 76     Body mass index is 38.97 kg/m.  PHYSICAL  EXAMNIATION:  Gen: NAD, conversant, well nourised, well groomed                     Cardiovascular: Regular rate rhythm, no peripheral edema, warm, nontender. Eyes: Conjunctivae clear without exudates or hemorrhage Neck: Supple, no carotid bruits. Pulmonary: Clear to auscultation bilaterally   NEUROLOGICAL EXAM:  MENTAL STATUS: Speech/cognition: Awake, alert, oriented to history taking and casual conversation, central obesity CRANIAL NERVES: CN II: Visual fields are full to confrontation. Pupils are round equal and briskly reactive to light. CN III, IV, VI: extraocular movement are normal. No ptosis. CN V: Facial sensation is intact to light touch CN VII: Face is symmetric with normal eye closure  CN VIII: Hearing is normal to causal conversation. CN IX, X: Phonation is normal. CN XI: Head turning and shoulder shrug are intact  MOTOR: There is no pronator drift of out-stretched arms. Muscle bulk and tone are normal. Muscle strength is normal.  REFLEXES: Reflexes are 2+ and symmetric at the biceps, triceps, knees, and ankles. Plantar responses are flexor.  SENSORY: Intact to light touch, pinprick and vibratory sensation are intact in fingers and toes.  COORDINATION: There is no trunk or limb dysmetria noted.  GAIT/STANCE: Able to get up from seated position arm crossed, steady  REVIEW OF SYSTEMS:  Full 14 system review of systems performed and notable only for as above All other review of systems were negative.   ALLERGIES: Allergies[1]  HOME MEDICATIONS: Current Outpatient Medications  Medication Sig Dispense Refill   acetaminophen  (TYLENOL ) 500 MG tablet Take 1,000 mg by mouth every 12 (twelve) hours as needed for mild pain or moderate pain.     albuterol  (PROVENTIL ) (2.5 MG/3ML) 0.083% nebulizer solution Take 3 mLs (2.5 mg total) by nebulization every 4 (four) hours as needed for wheezing or shortness of breath. 540 mL 12   aMILoride (MIDAMOR) 5 MG tablet Take 10 mg  by mouth daily. (Patient taking differently: Take 5 mg by mouth daily.)     atorvastatin  (LIPITOR) 40 MG tablet Take 2 tablets (80 mg total) by mouth daily. 180 tablet 3   B Complex-C (B-COMPLEX WITH VITAMIN C) tablet Take 1 tablet by mouth daily.     Cholecalciferol  (VITAMIN D3) 50 MCG (2000 UT) TABS Take 1 tablet by mouth daily.     diphenhydramine -acetaminophen  (TYLENOL  PM) 25-500 MG TABS tablet Take 1 tablet by mouth at bedtime as needed.     EPINEPHrine  (EPIPEN  2-PAK) 0.3 mg/0.3 mL IJ SOAJ injection Inject 0.3 mg into the muscle as needed for anaphylaxis. 2 each 2   Evolocumab  (REPATHA  SURECLICK) 140 MG/ML SOAJ Inject 140 mg into the skin every 14 (fourteen) days. 2 mL 11   famotidine  (PEPCID ) 40 MG tablet Take 40 mg by mouth 2 (two) times daily. (Patient taking differently: Take 40 mg by mouth daily.)     levocetirizine (XYZAL) 5 MG tablet Take 10 mg by mouth daily.     losartan (COZAAR) 50 MG tablet Take 50 mg by mouth daily.     lubiprostone (AMITIZA) 8 MCG capsule Take 8 mcg by mouth 2 (two) times daily.     montelukast  (SINGULAIR ) 10 MG tablet Take 1 tablet (10  mg total) by mouth daily. 90 tablet 1   mupirocin  ointment (BACTROBAN ) 2 % Apply 1 Application topically 2 (two) times daily. 30 g 2   nitroGLYCERIN  (NITROSTAT ) 0.4 MG SL tablet Place 1 tablet (0.4 mg total) under the tongue every 5 (five) minutes as needed for chest pain. 30 tablet 3   oxyCODONE  (OXY IR/ROXICODONE ) 5 MG immediate release tablet Take 1 tablet (5 mg total) by mouth at bedtime. 30 tablet 0   RABEprazole (ACIPHEX) 20 MG tablet Take 20 mg by mouth 2 (two) times daily.     Secukinumab (COSENTYX SENSOREADY PEN) 150 MG/ML SOAJ Inject 150 mg into the skin. (Patient taking differently: Inject 150 mg into the skin every 30 (thirty) days.)     ticagrelor  (BRILINTA ) 90 MG TABS tablet Take 1 tablet (90 mg total) by mouth 2 (two) times daily. 180 tablet 3   triamcinolone  ointment (KENALOG ) 0.1 % Apply 1 Application topically 2  (two) times daily.     No current facility-administered medications for this visit.    PAST MEDICAL HISTORY: Past Medical History:  Diagnosis Date   Albuminuria 06/27/2015   Anxiety 11/04/2014   Arm DVT (deep venous thromboembolism), acute, left (HCC) 08/17/2022   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/30/2014   Asthma    Atopic dermatitis 10/30/2008   Formatting of this note might be different from the original. Atopic dermatitis Formatting of this note might be different from the original. Formatting of this note might be different from the original. Atopic dermatitis Formatting of this note might be different from the original. Formatting of this note might be different from the original. Formatting of this note might be different from the or   Cataract 05/06/2015   Contusion of left wrist 07/08/2021   Coronary artery disease s/p PCI/DES to LAD 07/01/2023 12/28/2022   Crohn's disease (HCC) 04/21/2015   De Quervain's tenosynovitis, left 07/31/2021   Diabetes mellitus without complication (HCC)    Diabetes type 2, controlled (HCC) 04/21/2015   Dry eyes 05/06/2015   DVT (deep venous thrombosis) (HCC)    Dysfunction of both eustachian tubes 08/31/2012   Encounter for monitoring immunomodulating therapy 11/20/2021   Environmental allergies 08/12/2020   Esophageal candidiasis (HCC) 06/26/2022   Essential hypertension 06/27/2015   Fibrositis 08/28/2014   Formatting of this note might be different from the original. Fibromyalgia Formatting of this note might be different from the original. Formatting of this note might be different from the original. Fibromyalgia   Flank pain 11/24/2020   Gastroesophageal reflux disease with esophagitis without hemorrhage 08/01/2019   Formatting of this note might be different from the original. Formatting of this note might be different from the original. 07/2019: chronic symptoms of esophageal reflux since prior to sleeve gastrectomy  surgery. Symptoms have worsened over the last 6 months with recent EGD findings of grade B esophagitis, irregular z line, erythematous mucosa, and evidence of sleeve gastrectomy. 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 Formatting of this note might be different from the original. 07/2019: 8 mm colonic polyp removed during colonoscopy, biopsy showed serrated polyp. Repeat colonoscopy recommended in one year. Formatting of this note might be different from the original. Added automatically from request for surgery [id number] Formatting of  this note might b   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   History of kidney stones 08/12/2020   Hypercholesterolemia 10/07/2022   Hypertension, essential, benign 06/27/2015   Hypertensive disorder 08/28/2014   Formatting of this note might be different from the original. Hypertension Formatting of this note might be different from the original. Formatting of this note might be different from the original. 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 Keratoconus Formatting of this note might be different from the original. Formatting of this note might be different from the original. Keratoconus Formatting of this note might be different from the original. Formatting of this note might be different from the original. Keratoconus Formatting of this note might be different from the or   Microhematuria 06/27/2015   Migraine 08/28/2014   Formatting of this note might be different from the original. Migraine Formatting of this note might be different  from the original. Formatting of this note might be different from the original. Migraine Formatting of this note might be different from the original. Migraine Formatting of this note might be different from the original. Migraine Formatting of this note might be different from the or   Migraine without aura and without status migrainosus, not intractable 08/28/2014   Formatting of this note might be different from the original. Formatting of this note might be different from the original. Formatting of this note might be different from the original. Migraine Formatting of this note might be different from the original. Migraine Formatting of this note might be different from the original. Formatting of this note might be different from the original. Migraine F   Mild intermittent asthma without complication 10/30/2008   Formatting of this note might be different from the original. Formatting of this note might be different from the original. Formatting of this note might be different from the original. Asthma Formatting of this note might be different from the original. Asthma Formatting of this note might be different from the original. Formatting of this note might be different from the original. 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  Formatting of this note might be different from the original. 01/25/2016 with Dr. Loney Pol at [facility]. [place] of this note might be different from the original. 01/25/2016 with Dr. Loney Pol at [facility]. [place] of this note might be different from the original. Formatting of this note might be different from    Seronegative inflammatory arthritis 08/28/2014   Formatting of this note might be different from the original. Formatting of this note might be different from the original. Arthritis; diagnosed as IBD related - 2012 Formatting of this note might be different from the original. Followed by rheumatologist. Had to change providers  due to an insurance change earlier this year resulting in patient not being able to have Remicade  for ~6 months. 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 Formatting of this note might be different from the original. Treated with PRN miralax . Recent colonoscopy 07/2019. Formatting of this note might be different from the original. Treated with PRN miralax . Recent colonoscopy 07/2019. Formatting of this note might be different from the original. Formatting of this note might be different f   Status post placement of ureteral stent 02/06/2021   Tooth infection 08/12/2020   Unstable angina (HCC) 07/29/2023   Ureteral stone 01/20/2022   Urinary tract infection, site not specified 06/26/2022    PAST SURGICAL HISTORY: Past Surgical History:  Procedure Laterality Date   BACK SURGERY     L5-S1   CATARACT EXTRACTION Left    CERVICAL CONE BIOPSY  04/2000   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   CORONARY STENT INTERVENTION N/A 07/01/2023   Procedure: CORONARY STENT INTERVENTION;  Surgeon: Darron Deatrice LABOR, MD;  Location: [facility];  Service: Cardiovascular;  Laterality: N/A;   EYE SURGERY     GASTRIC BYPASS  2017   Sleve    LAPAROSCOPIC GASTRIC SLEEVE RESECTION     LEFT HEART CATH AND CORONARY ANGIOGRAPHY N/A 07/01/2023   Procedure: LEFT HEART CATH AND CORONARY ANGIOGRAPHY;  Surgeon: Darron Deatrice LABOR, MD;  Location: [facility];  Service: Cardiovascular;  Laterality: N/A;   LEFT HEART CATH AND CORONARY ANGIOGRAPHY N/A 08/01/2023   Procedure: LEFT HEART CATH AND CORONARY ANGIOGRAPHY;  Surgeon: Ladona Heinz, MD;  Location: [facility];  Service: Cardiovascular;  Laterality: N/A;   Low back disc surgery     06/2000   TOTAL KNEE ARTHROPLASTY Right 11/2016    FAMILY HISTORY: Family History  Adopted: Yes  Problem Relation Age of Onset   Diabetes Mother    Hypertension Mother    Diabetes Father    Hypertension Father    Hypertension Sister    Diabetes Sister    Hypertension Brother    Diabetes Brother    Diabetes Paternal Grandmother    Cancer Other    Asthma Neg Hx    Allergic rhinitis Neg Hx    Atopy Neg Hx    Eczema Neg Hx     SOCIAL HISTORY: Social History   Socioeconomic History   Marital status: Married    Spouse name: Not on file   Number of children: 0   Years of education: Not on file   Highest education level: Professional school degree (e.g., MD, DDS, DVM, JD)  Occupational History   Not on file  Tobacco Use   Smoking status: Never    Passive exposure: Never   Smokeless tobacco: Never  Vaping Use   Vaping status: Never Used  Substance and Sexual Activity   Alcohol use: Yes    Comment: rare   Drug use: Never   Sexual activity: Not Currently  Other Topics Concern   Not on file  Social History Narrative   2 dogs    Social Drivers of Health   Tobacco Use: Low Risk (08/07/2024)   Patient History    Smoking Tobacco Use: Never    Smokeless Tobacco Use: Never    Passive Exposure: Never  Financial Resource Strain: Medium Risk (07/10/2024)   Overall Financial Resource Strain (CARDIA)    Difficulty of Paying Living  Expenses: Somewhat hard  Food Insecurity: Food  Insecurity Present (07/10/2024)   Epic    Worried About Programme Researcher, Broadcasting/film/video in the Last Year: Sometimes true    Ran Out of Food in the Last Year: Never true  Transportation Needs: No Transportation Needs (07/10/2024)   Epic    Lack of Transportation (Medical): No    Lack of Transportation (Non-Medical): No  Physical Activity: Inactive (07/10/2024)   Exercise Vital Sign    Days of Exercise per Week: 0 days    Minutes of Exercise per Session: Not on file  Stress: No Stress Concern Present (07/10/2024)   [employer] of Occupational Health - Occupational Stress Questionnaire    Feeling of Stress: Not at all  Social Connections: Moderately Isolated (07/10/2024)   Social Connection and Isolation Panel    Frequency of Communication with Friends and Family: More than three times a week    Frequency of Social Gatherings with Friends and Family: Once a week    Attends Religious Services: Never    Database Administrator or Organizations: No    Attends Engineer, Structural: Not on file    Marital Status: Married  Catering Manager Violence: Not At Risk (06/08/2024)   Epic    Fear of Current or Ex-Partner: No    Emotionally Abused: No    Physically Abused: No    Sexually Abused: No  Depression (PHQ2-9): Low Risk (08/01/2024)   Depression (PHQ2-9)    PHQ-2 Score: 0  Recent Concern: Depression (PHQ2-9) - Medium Risk (07/10/2024)   Depression (PHQ2-9)    PHQ-2 Score: 9  Alcohol Screen: Low Risk (07/10/2024)   Alcohol Screen    Last Alcohol Screening Score (AUDIT): 1  Housing: Low Risk (07/10/2024)   Epic    Unable to Pay for Housing in the Last Year: No    Number of Times Moved in the Last Year: 0    Homeless in the Last Year: No  Recent Concern: Housing - High Risk (06/08/2024)   Epic    Unable to Pay for Housing in the Last Year: Yes    Number of Times Moved in the Last Year: 0    Homeless in the Last Year: No  Utilities: Not At Risk (06/08/2024)   Epic    Threatened with  loss of utilities: No  Health Literacy: Not on file      Modena Callander, M.D. Ph.D.  [facility] [address], [address] [place], [place] [address] Ph: [phone] Fax: [phone]  CC:  Iven Lang [name] [address] [place],  [place] [address]  Rothfuss, Jacob T, PA-C       [1]  Allergies Allergen Reactions   Albuterol  Sulfate Shortness Of Breath    Other reaction(s): Other (See Comments)  can't breathe  Other reaction(s): Other (See Comments) can't breathe   Corticosteroids Dermatitis, Palpitations and Swelling    ABLE TO TOLERATE VIA IM AND IV Inhaled - HA    ocular irritation   Silicone Dermatitis and Other (See Comments)    Cast and Bandage Cover  severe skin breakdown Cast and Bandage Cover  Cannot tolerate tegaderm, needs IV 3000. Paper tape.  Rash after several hours of EKG leads, but willing to tolerate.  Can tolerate silk tape for short stretches.  severe skin breakdown  severe skin breakdown Cast and Bandage Cover   Tape     Other reaction(s): Other (See Comments), Other (See Comments), Other (See Comments)  severe skin breakdown  Cast and Bandage Cover  severe skin breakdown  severe skin breakdown   Wound Dressings Dermatitis, Hives and Other (See Comments)    severe skin breakdown Cast and Bandage Cover  severe skin breakdown  severe skin breakdown Cast and Bandage Cover  Cannot tolerate tegaderm, needs IV 3000. Paper tape.  Rash after several hours of EKG leads, but willing to tolerate.  Can tolerate silk tape for short stretches.  Cast and Bandage Cover   Formoterol Other (See Comments)    Other reaction(s): Headaches  Other reaction(s): Headaches  Other reaction(s): Headaches  Headaches  Other reaction(s): Headaches Other reaction(s): Headaches    Other reaction(s): Headaches    Headaches   Hydrocodone  Rash   Hydrocodone -Acetaminophen  Rash   Nickel Rash and Other (See Comments)   Other Rash    cast and bandage use  cast and  bandage use  cast and bandage use  cast and bandage use    cast and bandage use cast and bandage use   Oxycodone -Acetaminophen  Other (See Comments)    headache   Salmeterol Rash   Strawberry Extract Hives    Full body rash from strawberry   Advair Hfa [Fluticasone-Salmeterol]     unknown   Proventil  Hfa [Albuterol ]     unknown   Asa [Aspirin ] Rash   Cleocin [Clindamycin] Rash   Clindamycin/Lincomycin Rash   Fludrocortisone Acetate Rash   Gabapentin Rash   Hydrochlorothiazide Rash   Imipramine Rash   Ipratropium Bromide Rash   Medroxyprogesterone Rash   Meloxicam Rash   Metformin And Related Rash   Nystatin Rash   Penicillins Rash   Pred Forte [Prednisolone] Rash   Prednisone Rash   Solu-Medrol  [Methylprednisolone ] Rash   Sulfa Antibiotics Rash   Tegaderm Ag Mesh [Silver] Rash   Toradol  [Ketorolac  Tromethamine ] Rash   Tramadol Rash   Venofer  [Iron  Sucrose] Rash   "

## 2024-08-07 NOTE — Telephone Encounter (Signed)
 Patient is requesting to peak with you before her appointment to discuss some issues she having and express her concerns. She requested a phone call I told her I would send you a message and let you chose how to move forward.

## 2024-08-07 NOTE — Telephone Encounter (Signed)
 Patient is scheduled with AK on 1/29 at 9:45am

## 2024-08-07 NOTE — Telephone Encounter (Signed)
 PDMP was Reviewed.  She has a scheduled appointment with Dr Carilyn Oxycodone  e-scribed to pharmacy.  Ms. Pantaleon is aware of the above via [place].

## 2024-08-07 NOTE — Patient Instructions (Signed)
 It was nice to see you today!  If you have any problems before your next visit feel free to message me via MyChart (minor issues or questions) or call the office, otherwise you may reach out to schedule an office visit.  Thank you! Pau Banh, PA-C

## 2024-08-08 ENCOUNTER — Telehealth: Payer: Self-pay | Admitting: Neurology

## 2024-08-08 ENCOUNTER — Other Ambulatory Visit: Payer: Self-pay | Admitting: Physical Medicine & Rehabilitation

## 2024-08-08 DIAGNOSIS — G8929 Other chronic pain: Secondary | ICD-10-CM

## 2024-08-08 DIAGNOSIS — M545 Low back pain, unspecified: Secondary | ICD-10-CM

## 2024-08-08 DIAGNOSIS — G894 Chronic pain syndrome: Secondary | ICD-10-CM

## 2024-08-08 MED ORDER — OXYCODONE HCL 5 MG PO TABS: 5.0000 mg | ORAL_TABLET | Freq: Every day | ORAL | 0 refills | Status: AC

## 2024-08-08 NOTE — Telephone Encounter (Signed)
 Spoke with patient by phone.  She requested seeing MD rather than the nurse practitioner of follow-up visits.  Reviewed medication MME, 7.5 daily.  Discussed that every 3 months visits would be appropriate.  She would have to call monthly to get refills.  These costs could be handled by nurse practitioner but clinic visits will be by MD.  We also discussed that virtual visits not available with me.  This would have to be in person.

## 2024-08-08 NOTE — Telephone Encounter (Signed)
"   [employer] shara: [id number] exp. 09/22/24 sent to [facility] for an open MRI. [phone] "

## 2024-08-10 ENCOUNTER — Encounter (HOSPITAL_BASED_OUTPATIENT_CLINIC_OR_DEPARTMENT_OTHER): Payer: Self-pay

## 2024-08-10 ENCOUNTER — Encounter (HOSPITAL_BASED_OUTPATIENT_CLINIC_OR_DEPARTMENT_OTHER): Payer: Self-pay | Admitting: Student

## 2024-08-10 ENCOUNTER — Ambulatory Visit: Payer: Self-pay

## 2024-08-10 ENCOUNTER — Ambulatory Visit (HOSPITAL_BASED_OUTPATIENT_CLINIC_OR_DEPARTMENT_OTHER)
Admission: EM | Admit: 2024-08-10 | Discharge: 2024-08-10 | Disposition: A | Discharge status: Home / Self Care | Attending: Family Medicine | Admitting: Family Medicine

## 2024-08-10 DIAGNOSIS — H5789 Other specified disorders of eye and adnexa: Secondary | ICD-10-CM

## 2024-08-10 MED ORDER — POLYMYXIN B-TRIMETHOPRIM 10000-0.1 UNIT/ML-% OP SOLN: 1.0000 [drp] | OPHTHALMIC | 0 refills | Status: DC

## 2024-08-10 MED ORDER — POLYMYXIN B-TRIMETHOPRIM 10000-0.1 UNIT/ML-% OP SOLN: 1.0000 [drp] | OPHTHALMIC | 0 refills | Status: AC

## 2024-08-10 NOTE — Telephone Encounter (Signed)
 Pt called back upset she has been trying to get this figured out since 0930 this am. She states she knows it's pink eye she gets it yearly. RN voiced understanding for her frustration and read her the message from [place]. She stated frustration but understanding and will head to UC.

## 2024-08-10 NOTE — ED Triage Notes (Signed)
 Patient believes she has pink eye in both eyes. No visible drainage. No redness, no swelling. States eye itch and sensitive to light.

## 2024-08-10 NOTE — Telephone Encounter (Signed)
 FYI Only or Action Required?: Action required by provider: clinical question for provider and update on patient condition.  Patient was last seen in primary care on 08/07/2024 by Rothfuss, Lang DASEN, PA-C.  Called Nurse Triage reporting Eye Problem.  Symptoms began yesterday.  Interventions attempted: Rest, hydration, or home remedies.  Symptoms are: unchanged.  Triage Disposition: See Physician Within 24 Hours  Patient/caregiver understands and will follow disposition?: No, wishes to speak with PCP  Message from [facility] sent at 08/10/2024  9:52 AM EST  Summary: Pink Eye   Reason for Triage: Pink eye in both eyes- extremely itchy, very early stages, eyes are very dry and requesting medicine for it  [phone] (M)            Reason for Disposition  MODERATE-SEVERE eyelid swelling on one side  (Exception: Due to a mosquito bite.)  Answer Assessment - Initial Assessment Questions Patient with hx of pink eye calling with concerns of pink eye. Reports severe itching, some swelling below her left eye. Patient is requesting medication for possible pink eye. Patient is wanting to stay away from UC due to her history. Asking for a call back from the office.   1. ONSET: When did the swelling start? (e.g., minutes, hours, days)     Started overnight 2. LOCATION: What part of the eyelids is swollen?     Under the left eye 3. SEVERITY: How swollen is it?     mild 4. ITCHING: Is there any itching? If Yes, ask: How much?   (Scale 1-10; mild, moderate or severe)     Yes-severe itching 5. PAIN: Is the swelling painful to touch? If Yes, ask: How painful is it?   (Scale 1-10; mild, moderate or severe)     Yes-pain to eyes is 5 out of 10 6. FEVER: Do you have a fever? If Yes, ask: What is it, how was it measured, and when did it start?      no 7. CAUSE: What do you think is causing the swelling?     Concerned for pink eye 8. RECURRENT SYMPTOM: Have you had eyelid  swelling before? If Yes, ask: When was the last time? What happened that time?     yes 9. OTHER SYMPTOMS: Do you have any other symptoms? (e.g., blurred vision, eye discharge, rash, runny nose)     Slight blurred vision  Protocols used: Eye - Swelling-A-AH

## 2024-08-11 ENCOUNTER — Encounter (HOSPITAL_BASED_OUTPATIENT_CLINIC_OR_DEPARTMENT_OTHER): Payer: Self-pay | Admitting: Student

## 2024-08-11 DIAGNOSIS — E66812 Obesity, class 2: Secondary | ICD-10-CM | POA: Insufficient documentation

## 2024-08-11 DIAGNOSIS — L300 Nummular dermatitis: Secondary | ICD-10-CM | POA: Insufficient documentation

## 2024-08-11 NOTE — ED Provider Notes (Signed)
 " [facility]    CSN: [id number] Arrival date & time: 08/10/24  1655      History   Chief Complaint Chief Complaint  Patient presents with   eye irritation    HPI Casey [name] is a 54 y.o. female.   Patient is a 54 year old female who presents today with complaints of itchy eyes bilateral with drainage.  Patient believes she has pink eye in both eyes. No visible drainage. No redness, no swelling. States eye itch and sensitive to light.      Past Medical History:  Diagnosis Date   Albuminuria 06/27/2015   Anxiety 11/04/2014   Arm DVT (deep venous thromboembolism), acute, left (HCC) 08/17/2022   Atopic dermatitis 10/30/2008   Coronary artery disease s/p PCI/DES to LAD 07/01/2023 12/28/2022   Crohn's disease (HCC) 04/21/2015   Diabetes type 2, controlled (HCC) 04/21/2015   Esophageal candidiasis (HCC) 06/26/2022   Fibrositis 08/28/2014   Hiatal hernia 10/07/2022   History of abnormal cervical Pap smear 11/29/2014   History of colon polyps 06/19/2019   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   History of kidney stones 08/12/2020   Hypercholesterolemia 10/07/2022   Hypertension, essential, benign 06/27/2015   Irregular astigmatism of both eyes 10/11/2018   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   Migraine without aura and without status migrainosus, not intractable 08/28/2014   Mild intermittent asthma without complication 10/30/2008   Morbid obesity with BMI of 40.0-44.9, adult (HCC) 03/31/2022   Myopia with astigmatism and presbyopia 05/07/2016   Nausea and vomiting 09/27/2022   Nephrolithiasis 09/08/2018   Obstructive sleep apnea 12/12/2013   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 Formatting of this note might be  different from the original. 01/25/2016 with Dr. Loney Pol at [facility]. [place] of this note might be different from the original. 01/25/2016 with Dr. Loney Pol at [facility]. [place] of this note might be different from the original. Formatting of this note might be different from    Slow transit constipation 08/01/2019   Formatting of this note might be different from the original. Formatting of this note might be different from the original. Treated with PRN miralax . Recent colonoscopy 07/2019. Formatting of this note might be different from the original. Treated with PRN miralax . Recent colonoscopy 07/2019. Formatting of this note might be different from the original. 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   Coronary artery disease s/p PCI/DES to LAD 07/01/2023 12/28/2022   Dyslipidemia 10/07/2022   Hiatal hernia 10/07/2022   Esophageal candidiasis (HCC) 06/26/2022   Rheumatoid arthritis of multiple sites with 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 factor (HCC) 11/16/2021   Status post placement of ureteral stent 02/06/2021   Proteinuria 08/12/2020   Environmental allergies 08/12/2020   History of kidney stones 08/12/2020   Gastroesophageal  reflux disease with esophagitis without hemorrhage 08/01/2019   Slow transit constipation 08/01/2019   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   History of colon polyps 06/19/2019   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   Overactive bladder 11/18/2015   Incontinence 10/07/2015   Dry eyes 05/06/2015   Crohn's disease (HCC) 04/21/2015   Diabetes mellitus type 2, diet-controlled (HCC) 04/21/2015   Osteoarthritis of both knees 04/21/2015   History of abnormal cervical Pap smear 11/29/2014   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   Chronic migraine w/o aura w/o status migrainosus, not intractable 08/28/2014   Migraine 08/28/2014   Seronegative inflammatory arthritis 08/28/2014   Dysfunction of both eustachian tubes 08/31/2012   Moderate persistent asthma 10/30/2008    Past Surgical History:  Procedure Laterality Date   BACK SURGERY     L5-S1   CATARACT EXTRACTION Left    CERVICAL CONE BIOPSY  04/2000   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   CORONARY STENT INTERVENTION N/A 07/01/2023   Procedure: CORONARY STENT INTERVENTION;  Surgeon: Darron Deatrice LABOR, MD;  Location: [facility];  Service: Cardiovascular;  Laterality: N/A;   EYE SURGERY     GASTRIC BYPASS  2017   Sleve   LAPAROSCOPIC GASTRIC SLEEVE RESECTION     LEFT HEART CATH AND CORONARY ANGIOGRAPHY N/A 07/01/2023   Procedure: LEFT HEART CATH AND CORONARY ANGIOGRAPHY;  Surgeon: Darron Deatrice LABOR, MD;  Location: [facility];  Service: Cardiovascular;  Laterality: N/A;   LEFT HEART CATH AND CORONARY ANGIOGRAPHY N/A 08/01/2023   Procedure: LEFT HEART CATH AND CORONARY ANGIOGRAPHY;  Surgeon: Ladona Heinz, MD;  Location: [facility];  Service: Cardiovascular;  Laterality: N/A;   Low back disc surgery     06/2000   TOTAL KNEE ARTHROPLASTY Right 11/2016    OB History     Gravida  0   Para  0   Term  0   Preterm  0   AB  0   Living  0      SAB  0   IAB  0   Ectopic  0   Multiple  0   Live Births  0            Home Medications    Prior to Admission medications  Medication Sig Start Date End Date Taking? Authorizing Provider  acetaminophen  (TYLENOL ) 500 MG tablet Take  1,000 mg by mouth every 12 (twelve) hours as needed for mild pain or moderate pain.    [provider]  albuterol  (PROVENTIL ) (2.5 MG/3ML) 0.083% nebulizer solution Take 3 mLs (2.5 mg total) by nebulization every 4 (four) hours as needed for wheezing or shortness of breath. 04/18/24   Hunsucker, Donnice SAUNDERS, MD  aMILoride (MIDAMOR) 5 MG tablet Take 10 mg by mouth daily. Patient taking differently: Take 5 mg by mouth daily. 03/18/24   [provider]  atorvastatin  (LIPITOR) 40 MG tablet Take 2 tablets (80 mg total) by mouth daily. 01/19/24   Tolia, Sunit, DO  B Complex-C (B-COMPLEX WITH VITAMIN C) tablet Take 1 tablet by mouth daily. 09/06/19   [provider]  butalbital -acetaminophen -caffeine  (FIORICET) 50-325-40 MG tablet Take 1 tablet by mouth every 6 (six) hours as needed for headache. 08/07/24   Onita Duos, MD  Cholecalciferol  (VITAMIN D3) 50 MCG (2000 UT) TABS Take 1 tablet by mouth daily.    [provider]  diphenhydramine -acetaminophen  (TYLENOL  PM) 25-500 MG TABS tablet Take 1 tablet by mouth at  bedtime as needed.    [provider]  EPINEPHrine  (EPIPEN  2-PAK) 0.3 mg/0.3 mL IJ SOAJ injection Inject 0.3 mg into the muscle as needed for anaphylaxis. 04/24/24   Cari Arlean HERO, FNP  Evolocumab  (REPATHA  SURECLICK) 140 MG/ML SOAJ Inject 140 mg into the skin every 14 (fourteen) days. 12/27/23   Tolia, Sunit, DO  famotidine  (PEPCID ) 40 MG tablet Take 40 mg by mouth 2 (two) times daily. Patient taking differently: Take 40 mg by mouth daily. 12/20/23 12/19/24  [provider]  levocetirizine (XYZAL) 5 MG tablet Take 10 mg by mouth daily.    [provider]  losartan (COZAAR) 50 MG tablet Take 50 mg by mouth daily.    [provider]  lubiprostone (AMITIZA) 8 MCG capsule Take 8 mcg by mouth 2 (two) times daily.    [provider]  montelukast  (SINGULAIR ) 10 MG tablet Take 1 tablet (10 mg total) by mouth daily. 04/24/24   Cari Arlean HERO,  FNP  mupirocin  ointment (BACTROBAN ) 2 % Apply 1 Application topically 2 (two) times daily. 07/11/24   Christine Rush, DPM  nitroGLYCERIN  (NITROSTAT ) 0.4 MG SL tablet Place 1 tablet (0.4 mg total) under the tongue every 5 (five) minutes as needed for chest pain. 12/13/23   Tolia, Sunit, DO  oxyCODONE  (OXY IR/ROXICODONE ) 5 MG immediate release tablet Take 1 tablet (5 mg total) by mouth at bedtime. 08/08/24   Kirsteins, Prentice BRAVO, MD  RABEprazole (ACIPHEX) 20 MG tablet Take 20 mg by mouth 2 (two) times daily.    [provider]  Secukinumab (COSENTYX SENSOREADY PEN) 150 MG/ML SOAJ Inject 150 mg into the skin. Patient taking differently: Inject 150 mg into the skin every 30 (thirty) days. 03/01/24   [provider]  ticagrelor  (BRILINTA ) 90 MG TABS tablet Take 1 tablet (90 mg total) by mouth 2 (two) times daily. 01/12/24   Tolia, Sunit, DO  triamcinolone  ointment (KENALOG ) 0.1 % Apply 1 Application topically 2 (two) times daily. 07/17/24 07/17/25  [provider]  trimethoprim -polymyxin b  (POLYTRIM ) ophthalmic solution Place 1 drop into both eyes every 4 (four) hours for 7 days. 08/10/24 08/17/24  Adah Wilbert LABOR, FNP    Family History Family History  Adopted: Yes  Problem Relation Age of Onset   Diabetes Mother    Hypertension Mother    Diabetes Father    Hypertension Father    Hypertension Sister    Diabetes Sister    Hypertension Brother    Diabetes Brother    Diabetes Paternal Grandmother    Cancer Other    Asthma Neg Hx    Allergic rhinitis Neg Hx    Atopy Neg Hx    Eczema Neg Hx     Social History Social History[1]   Allergies   Albuterol  sulfate, Corticosteroids, Silicone, Tape, Wound dressings, Formoterol, Hydrocodone , Hydrocodone -acetaminophen , Nickel, Other, Oxycodone -acetaminophen , Salmeterol, Strawberry extract, Advair hfa [fluticasone-salmeterol], Proventil  hfa [albuterol ], Asa [aspirin ], Cleocin [clindamycin], Clindamycin/lincomycin, Fludrocortisone acetate,  Gabapentin, Hydrochlorothiazide, Imipramine, Ipratropium bromide, Medroxyprogesterone, Meloxicam, Metformin and related, Nystatin, Penicillins, Pred forte [prednisolone], Prednisone, Solu-medrol  [methylprednisolone ], Sulfa antibiotics, Tegaderm ag mesh [silver], Toradol  [ketorolac  tromethamine ], Tramadol, and Venofer  [iron  sucrose]   Review of Systems Review of Systems See HPI  Physical Exam Triage Vital Signs ED Triage Vitals  Encounter Vitals Group     BP 08/10/24 1934 133/79     Girls Systolic BP Percentile --      Girls Diastolic BP Percentile --      Boys Systolic BP Percentile --  Boys Diastolic BP Percentile --      Pulse Rate 08/10/24 1934 (!) 101     Resp 08/10/24 1934 20     Temp 08/10/24 1934 98.6 F (37 C)     Temp Source 08/10/24 1934 Oral     SpO2 08/10/24 1934 98 %     Weight --      Height --      Head Circumference --      Peak Flow --      Pain Score 08/10/24 1936 5     Pain Loc --      Pain Education --      Exclude from Growth Chart --    No data found.  Updated Vital Signs BP 133/79 (BP Location: Right Arm)   Pulse (!) 101   Temp 98.6 F (37 C) (Oral)   Resp 20   SpO2 98%   Visual Acuity Right Eye Distance:   Left Eye Distance:   Bilateral Distance:    Right Eye Near:   Left Eye Near:    Bilateral Near:     Physical Exam   UC Treatments / Results  Labs (all labs ordered are listed, but only abnormal results are displayed) Labs Reviewed - No data to display  EKG   Radiology No results found.  Procedures Procedures (including critical care time)  Medications Ordered in UC Medications - No data to display  Initial Impression / Assessment and Plan / UC Course  I have reviewed the triage vital signs and the nursing notes.  Pertinent labs & imaging results that were available during my care of the patient were reviewed by me and considered in my medical decision making (see chart for details).     Bilateral eye  irritation.  No specific concerns on exam today.  Patient reports this feels similar to when she had pinkeye previously and is requesting eyedrops for this.  Polytrim  sent in.  Recommended cool compresses to the eyes.  Follow-up as needed Final Clinical Impressions(s) / UC Diagnoses   Final diagnoses:  Eye irritation   Discharge Instructions   None    ED Prescriptions     Medication Sig Dispense Auth. Provider   trimethoprim -polymyxin b  (POLYTRIM ) ophthalmic solution  (Status: Discontinued) Place 1 drop into both eyes every 4 (four) hours for 7 days. 10 mL Korissa Horsford A, FNP   trimethoprim -polymyxin b  (POLYTRIM ) ophthalmic solution Place 1 drop into both eyes every 4 (four) hours for 7 days. 10 mL Adah Wilbert LABOR, FNP      PDMP not reviewed this encounter.    [1]  Social History Tobacco Use   Smoking status: Never    Passive exposure: Never   Smokeless tobacco: Never  Vaping Use   Vaping status: Never Used  Substance Use Topics   Alcohol use: Yes    Comment: rare   Drug use: Never     Adah Wilbert LABOR, FNP 08/11/24 0836  "

## 2024-08-14 ENCOUNTER — Encounter: Payer: Self-pay | Admitting: Hematology and Oncology

## 2024-08-14 NOTE — Telephone Encounter (Signed)
 Pt called stated that  Mri was placed under old insurance and need to be changed ,. Pt was very Economist  . Informed Pt that  I will get ger over to  Coordinator the patient per fer to speak to Nurse

## 2024-08-15 ENCOUNTER — Encounter: Payer: Self-pay | Admitting: Hematology and Oncology

## 2024-08-16 ENCOUNTER — Other Ambulatory Visit (HOSPITAL_BASED_OUTPATIENT_CLINIC_OR_DEPARTMENT_OTHER)

## 2024-08-20 ENCOUNTER — Encounter: Payer: Self-pay | Admitting: Neurology

## 2024-08-21 NOTE — Telephone Encounter (Signed)
 Brittany from [facility] called to informed that  they need PA  FOR pt MRI scheduled for tomorrow.  Brittany informed that  Pt has new policy [employer])  They are requesting PA.

## 2024-08-22 NOTE — Telephone Encounter (Signed)
 [facility] also sent a fax today at 10:52am that her appointment is today at 1:30PM at [facility].

## 2024-08-23 NOTE — Telephone Encounter (Signed)
 Patient would like a call back to discuss dizziness and headaches. They have increase since last visit.

## 2024-08-23 NOTE — Telephone Encounter (Signed)
 I called pt.  She states that her dizziness is constant, mild headaches for last 3 days.  Sitting up she feels like she may fall at times.  She has a cane to use.  She had [facility] yesterday,  then EEG this Monday and carotid US  on Monday as well.  I told her that she will  be called with results when received.   She has no follow up appt scheduled.  Acute worsening of sx then to call 911 or seek care at ED.  She verbalized understanding.

## 2024-08-23 NOTE — Telephone Encounter (Signed)
 I called and LMVM for pt that a mychart message that was sent yesterday had not been read, appt for MRI scheduled yesterday.  She is to call and get herself rescheduled.  She is to call back if questions.

## 2024-08-24 NOTE — Progress Notes (Signed)
 [facility]   Referring Physician:  No primary care provider on file.  Chief complaints: Dyspnea   History of Present Illness: Casey [name] is a 54 y.o. very pleasant woman with history of gastric bypass surgery, CKD, hypertension, hyperlipidemia, CAD status post PCI/DES to LAD in 2024 who has been previously following up with [facility] health cardiology is here to establish cardiac care with us . Over the last several months, patient has been having occasional episodes of dyspnea on moderate exertion. No chest pain/orthopnea/PND/palpitation/syncope.  No abnormal bleed. For her dyspnea, she had a cardiac stress test in September 2025 that did not show any ischemia. Also she has been having vertigo with feeling of the room spinning and is being evaluated by neurology.     Review of systems:  Review of other systems negative except as discussed above.    Past Medical History:  Diagnosis Date   Acute deep vein thrombosis (DVT) of brachial vein of left upper extremity (*)    Anxiety 11/04/2014   Autoimmune disease (*)    Blood dyscrasia    CKD (chronic kidney disease), stage III (*)    PER PT STAGE 3 OR 4 UNABLE TO RECALL   Colon polyp    Deep vein thrombosis (DVT) (*)    Diabetes (*)    NOT CURRENTLY ON MEDS   Diabetes type 2, controlled (*) 04/21/2015   Hx of gastric bypass    SLEEVE   Hypertension    Hypertension, essential, benign 06/27/2015   Obesity    Obstructive sleep apnea 12/12/2013   OSA (obstructive sleep apnea)    JUST RETESTED NO RESULTS YET   Rheumatoid arthritis (*)    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      aMILoride (MIDAMOR) 5 mg tablet Take one tablet (5 mg dose) by mouth every morning.     atorvastatin  (LIPITOR) 40 mg tablet Take by mouth.     b complex vitamins capsule Take one capsule by mouth every morning.     Cholecalciferol  (D3 HIGH POTENCY) 50 MCG (2000 UT) CAPS Take one capsule (2,000 Units dose) by mouth every morning.     clopidogrel  bisulfate (PLAVIX ) 75 mg tablet Take one tablet (75 mg dose) by mouth daily. 90 tablet 3   clotrimazole-betamethasone  (LOTRISONE) cream Apply topically 2 (two) times daily. 45 g 0   COSENTYX SENSOREADY PEN 150 MG/ML SOAJ injection Inject into the skin every 30 (thirty) days.     difluprednate (DUREZOL) 0.05% EMUL Apply one drop to eye 2 (two) times daily. POST SURGERY.  HAVE NOT STARTED WILL START AFTER CATARACT SURGERY 05/29/24     EPINEPHrine  (AUVI-Q ,EPIPEN ) 0.3 mg/0.3 mL injection Inject 0.3 mg into the muscle as needed for anaphylaxis.     levocetirizine (XYZAL) 5 MG tablet Take two tablets (10 mg dose) by mouth every morning.     losartan potassium (COZAAR) 25 mg tablet Take one tablet (25 mg dose) by mouth daily. 90 tablet 3   lubiprostone (AMITIZA) 8 mcg capsule Take one capsule (8 mcg dose) by mouth 2 (two) times daily with meals. 180 capsule 0   metoprolol  succinate (TOPROL  XL) 25 mg 24  hr tablet Take one tablet (25 mg dose) by mouth daily. 30 tablet 11   montelukast  (SINGULAIR ) 10 MG tablet Take one tablet (10 mg dose) by mouth every morning.     Multiple Vitamin (MULTIVITAMIN) tablet Take one tablet by mouth every morning.     nitroGLYCERIN  (NITROSTAT ) 0.4 mg SL tablet Place one tablet (0.4 mg dose) under the tongue every 5 (five) minutes as needed for Chest pain.     ofloxacin (OCUFLOX) 0.3% ophthalmic solution Place one drop into the left eye 3 (three) times a day.     oxyCODONE  HCl (ROXICODONE ) 5 mg immediate release tablet Take 5 mg by mouth as needed for moderate pain (pain score 4-6) or severe pain (pain score 7-10). Very  rarely     RABEprazole (ACIPHEX) 20 MG tablet Take one tablet (20 mg dose) by mouth 2 (two) times daily. 180 tablet 3   REPATHA  SURECLICK 140 MG/ML SOAJ Pen Inject 1 mL (140 mg dose) into the skin every 14 (fourteen) days.     ZOLMitriptan  (ZOMIG ) 5 MG tablet Take one tablet (5 mg dose) by mouth as needed.     No current facility-administered medications for this visit.    Allergies[3]  Physical Exam:  BP 114/72   Pulse 85   Ht 5' 3 (1.6 m)   Wt 223 lb (101.2 kg)   LMP  (LMP Unknown)   BMI 39.50 kg/m   General: Not in distress. HEENT: No pallor / icterus / cyanosis Neck: No JVD. No carotid bruits. No thyromegaly.  Cardiovascular exam: S1 S2 normal. RRR. No murmur. No rub. No gallop.  Lungs: Bilateral normal vesicular breath sounds. Bilaterally clear without crepitations or wheeze Extremities: No edema. No cyanosis.  Neurological exam: Alert, awake and oriented to time, place and person Psychiatric exam: Normal mood.      Lab Results  Component Value Date   Na 139 01/23/2024   Potassium 3.7 01/23/2024   BUN 8 01/23/2024   Creatinine 0.91 01/23/2024     Lab Results  Component Value Date   ALT 11 01/23/2024   AST 18 01/23/2024   ALK PHOS 123 01/23/2024   T Bili 0.4 01/23/2024    ECG shows normal sinus rhythm, left axis deviation and poor R wave progression.  Assessment and Plan: 1.  CAD status post PCI/DES to LAD in 2024 2.  Dyspnea on exertion 3.  Hypertension 4.  Hyperlipidemia  Recent nuclear cardiac stress test did not show any inducible myocardial ischemia Will update echocardiogram Patient has allergy  to aspirin  so she has not been on aspirin .  She has been on Brilinta  90 mg p.o. twice a day.  However the patient wants to change twice a day medication once a day medication.  So we will change her Brilinta  to Plavix  75 mg p.o. once daily. Continue atorvastatin  40 mg p.o. daily.  Start metoprolol  succinate 25 mg p.o. daily. As we are starting  metoprolol , will reduce the dose of her losartan to 25 mg p.o. daily Will get fasting lipid panel during next clinic visit.    Orders Placed This Encounter  Procedures   ECG 12 lead   Echocardiogram Complete WO Enhancing Agent     Follow up in 3 months as discussed or sooner if needed for any new or worsening cardiac condition / symptoms.     This note was created using voice recognition software. There might be minor syntax, contextual, and spelling errors that may be present and are related to the  use of this software and were not intentional     Arlyce Like, MD; [facility]  [facility], [facility]  Electronically signed by: Arlyce Like, MD 08/24/2024 8:33 PM        [1] Social History Socioeconomic History   Marital status: Married  Occupational History   Occupation: mental health proffesional  Tobacco Use   Smoking status: Never    Passive exposure: Never   Smokeless tobacco: Never  Vaping Use   Vaping status: Never Used  Substance and Sexual Activity   Alcohol use: Not Currently    Comment: rare/social   Drug use: Never  [2] Family History Problem Relation Name Age of Onset   Colon cancer Maternal Grandfather      Maternal Grandmother     Colon polyps Mother    [3] Allergies Allergen Reactions   Albuterol  Shortness Of Breath, Rash and Other    rash, breathing DIFFICULTY WITH INHALER ONLY    Nebulizer okay per pt, inhaler use not due to a binder agent   Albuterol  Sulfate Shortness Of Breath    ABLE TO TOLERATE VIA NEBULIZER    Corticosteroids Swelling, Palpitations and Rash    ABLE TO TOLERATE VIA IM AND IV Inhaled - HA    ocular irritation         Corticosteroids (Glucocorticoids) Swelling, Palpitations, Rash and Other    RASH/SWELLING   Hydrochlorothiazide Hives, Other, Rash and Shortness Of Breath    Other reaction(s): Other (See Comments), Unknown  HA  HA  HA  Other reaction(s):  Other (See Comments), Unknown  HA  Other reaction(s): Other (See Comments), Unknown HA HA    HA    Other reaction(s): Other (See Comments), Unknown HA HA  HA  Other reaction(s): Other (See Comments), Unknown HA    Other reaction(s): Other (See Comments), Unknown HA   Imipramine Dizziness, Itching, Other and Rash    Other reaction(s): Other (See Comments)   Other reaction(s): Headaches  Other reaction(s): Other (See Comments) Other reaction(s): Headaches Other reaction(s): Headaches Other reaction(s): Headaches Other reaction(s): Headaches    headaches    Other reaction(s): Other (See Comments)  Other reaction(s): Headaches     Other reaction(s): Headaches    Other reaction(s): Headaches  headaches   Penicillins Rash, Anaphylaxis and Other    Tolerated test dose of ceFEPime and dose remainder 2.20.20, Pt tolerates ceftriaxone  during hospitalization 2021 w/out ANY side effects   Prednisolone Palpitations    Other reaction(s): Other (See Comments)  ocular irritation   Other reaction(s): Other (See Comments)  ocular irritation   unknown   Prednisone Rash   Toradol  Palpitations   Tramadol Palpitations and Rash   Wound Dressing Adhesive Other, Rash and Hives    severe skin breakdown  Cast and Bandage Cover  severe skin breakdown  severe skin breakdown  Cast and Bandage Cover    Cannot tolerate tegaderm, needs IV 3000. Paper tape.  Rash after several hours of EKG leads, but willing to tolerate.  Can tolerate silk tape for short stretches.  Cast and Bandage Cover   Advair Hfa Dizziness, Other, Rash and Hives    Other reaction(s): Headaches   Aspirin  Rash   Clindamycin Hcl Other and Rash    unknown  CLINDAMYCIN PALMITATE HCL, Clindamycin, CLINDAMYCIN PHOSPHATE    Cleocin   Clindamycin/Lincomycin Rash   Fludrocortisone Dizziness, Other and Rash    Other reaction(s): Other (See Comments)  Other reaction(s): Headaches  unknown   Fludrocortisone Acetate  Dizziness, Other and  Rash    unknown  Other reaction(s): Headaches   Fluticasone-Salmeterol Dizziness    Other reaction(s): Other (See Comments), Unknown  Other reaction(s): Headaches  Other reaction(s): Headaches  Other reaction(s): Headaches  Other reaction(s): Headaches   Formoterol Dizziness, Other and Unknown    Other reaction(s): Headaches  Other reaction(s): Headaches  Other reaction(s): Headaches  Headaches   Gabapentin Rash    20 years ago  nausea nausea nausea nausea    nausea, nausea    20 years ago     nausea nausea    nausea  nausea, nausea   Hydrocodone  Rash   Hydrocodone -Acetaminophen  Rash    ABLE TO TOLERATE ACETAMINOPHEN    Imipramine Hcl Unknown   Ipratropium Rash, Other and Dizziness    Other reaction(s): Other RASH AND HEADACHES   Ipratropium Bromide Rash   Iron  Sucrose Rash   Ketorolac  Rash    Palpitations   Palpitations    Palpitations   Medroxyprogesterone Other, Swelling and Unknown    Other reaction(s): Other (See Comments), Unknown, Unknown  Other reaction(s): Unknown  Other reaction(s): Unknown  Other reaction(s): Unknown  Other reaction(s): Unknown  Other reaction(s): Unknown  Other reaction(s): Other (See Comments), Unknown, Unknown   Meloxicam Rash    Pt has tolerated toradol  during hospitalization ( 08/2019) w/out rash or other side effects  Pt has tolerated toradol  during hospitalization ( 08/2019) w/out rash or other side effects  Pt has tolerated toradol  during hospitalization ( 08/2019) w/out rash or other side effects   Metformin Hcl Swelling   Nickel Rash   Nystatin Other, Unknown and Rash    Other reaction(s): Other (See Comments)  unknown  unknown  Other reaction(s): Other (See Comments) unknown    unknown    Other reaction(s): Other (See Comments) unknown  unknown   Other Rash    cast and bandage use  cast and bandage use  cast and bandage use   Oxycodone -Acetaminophen  Other    headache    Prednisolone Acetate Other    ocular irritation   unknown  Other reaction(s): Other (See Comments)  ocular irritation    Salmeterol Rash   Silver Other and Unknown    .   Strawberry Hives    Full body rash from strawberry   Sulfa Antibiotics Rash   Tramadol Hcl Rash   Unclassified Drug Rash    Other reaction(s): Rash, cast and bandage use, Fludrocortisone acetate; Inhaled corticosteroids; Adhesive, cast and bandage use; Clindamycin; pred forte, cast and bandage use, Fludrocortisone acetate; Inhaled corticosteroids; Adhesive, cast and bandage use; Clindamycin; pred forte, cast and bandage use, Fludrocortisone acetate; Inhaled corticosteroids; Adhesive, cast and bandage use; Clindamycin; pred forte, cast and bandage use, Fludrocortisone acetate; Inhaled corticosteroids; Adhesive, cast and banda   Metformin Diarrhea    Cant remember

## 2024-08-26 ENCOUNTER — Ambulatory Visit (HOSPITAL_BASED_OUTPATIENT_CLINIC_OR_DEPARTMENT_OTHER): Admitting: Pulmonary Disease

## 2024-08-27 ENCOUNTER — Ambulatory Visit: Admitting: Neurology

## 2024-08-27 ENCOUNTER — Encounter: Payer: Self-pay | Admitting: Hematology and Oncology

## 2024-08-27 ENCOUNTER — Ambulatory Visit (HOSPITAL_COMMUNITY)
Admission: RE | Admit: 2024-08-27 | Discharge: 2024-08-27 | Disposition: A | Discharge status: Home / Self Care | Source: Ambulatory Visit | Attending: Surgery | Admitting: Surgery

## 2024-08-27 DIAGNOSIS — G43709 Chronic migraine without aura, not intractable, without status migrainosus: Secondary | ICD-10-CM

## 2024-08-27 DIAGNOSIS — R404 Transient alteration of awareness: Secondary | ICD-10-CM | POA: Insufficient documentation

## 2024-08-27 DIAGNOSIS — R4182 Altered mental status, unspecified: Secondary | ICD-10-CM

## 2024-08-30 ENCOUNTER — Ambulatory Visit: Payer: Self-pay | Admitting: Neurology

## 2024-09-01 ENCOUNTER — Encounter: Payer: Self-pay | Admitting: Hematology and Oncology

## 2024-09-03 ENCOUNTER — Encounter (HOSPITAL_BASED_OUTPATIENT_CLINIC_OR_DEPARTMENT_OTHER): Payer: Self-pay

## 2024-09-03 ENCOUNTER — Other Ambulatory Visit: Payer: Self-pay

## 2024-09-03 ENCOUNTER — Emergency Department (HOSPITAL_BASED_OUTPATIENT_CLINIC_OR_DEPARTMENT_OTHER)
Admission: EM | Admit: 2024-09-03 | Discharge: 2024-09-03 | Disposition: A | Discharge status: Home / Self Care | Attending: Emergency Medicine | Admitting: Emergency Medicine

## 2024-09-03 ENCOUNTER — Emergency Department (HOSPITAL_BASED_OUTPATIENT_CLINIC_OR_DEPARTMENT_OTHER): Admitting: Radiology

## 2024-09-03 DIAGNOSIS — R112 Nausea with vomiting, unspecified: Secondary | ICD-10-CM | POA: Diagnosis present

## 2024-09-03 DIAGNOSIS — E119 Type 2 diabetes mellitus without complications: Secondary | ICD-10-CM | POA: Insufficient documentation

## 2024-09-03 DIAGNOSIS — R42 Dizziness and giddiness: Secondary | ICD-10-CM | POA: Diagnosis not present

## 2024-09-03 DIAGNOSIS — E876 Hypokalemia: Secondary | ICD-10-CM | POA: Insufficient documentation

## 2024-09-03 DIAGNOSIS — J0110 Acute frontal sinusitis, unspecified: Secondary | ICD-10-CM | POA: Diagnosis not present

## 2024-09-03 DIAGNOSIS — J019 Acute sinusitis, unspecified: Secondary | ICD-10-CM

## 2024-09-03 LAB — COMPREHENSIVE METABOLIC PANEL WITH GFR
ALT: 13 U/L (ref 0–44)
AST: 21 U/L (ref 15–41)
Albumin: 4.3 g/dL (ref 3.5–5.0)
Alkaline Phosphatase: 125 U/L (ref 38–126)
Anion gap: 12 (ref 5–15)
BUN: 8 mg/dL (ref 6–20)
CO2: 28 mmol/L (ref 22–32)
Calcium: 10 mg/dL (ref 8.9–10.3)
Chloride: 100 mmol/L (ref 98–111)
Creatinine, Ser: 0.79 mg/dL (ref 0.44–1.00)
GFR, Estimated: >60 mL/min
Glucose, Bld: 102 mg/dL — ABNORMAL HIGH (ref 70–99)
Potassium: 3.3 mmol/L — ABNORMAL LOW (ref 3.5–5.1)
Sodium: 140 mmol/L (ref 135–145)
Total Bilirubin: 0.9 mg/dL (ref 0.0–1.2)
Total Protein: 8.2 g/dL — ABNORMAL HIGH (ref 6.5–8.1)

## 2024-09-03 LAB — CBC WITH DIFFERENTIAL/PLATELET
Abs Immature Granulocytes: 0.02 10*3/uL (ref 0.00–0.07)
Basophils Absolute: 0 10*3/uL (ref 0.0–0.1)
Basophils Relative: 0 %
Eosinophils Absolute: 0.2 10*3/uL (ref 0.0–0.5)
Eosinophils Relative: 3 %
HCT: 44 % (ref 36.0–46.0)
Hemoglobin: 14.3 g/dL (ref 12.0–15.0)
Immature Granulocytes: 0 %
Lymphocytes Relative: 26 %
Lymphs Abs: 1.9 10*3/uL (ref 0.7–4.0)
MCH: 27.1 pg (ref 26.0–34.0)
MCHC: 32.5 g/dL (ref 30.0–36.0)
MCV: 83.5 fL (ref 80.0–100.0)
Monocytes Absolute: 0.9 10*3/uL (ref 0.1–1.0)
Monocytes Relative: 12 %
Neutro Abs: 4.3 10*3/uL (ref 1.7–7.7)
Neutrophils Relative %: 59 %
Platelets: 276 10*3/uL (ref 150–400)
RBC: 5.27 MIL/uL — ABNORMAL HIGH (ref 3.87–5.11)
RDW: 18.9 % — ABNORMAL HIGH (ref 11.5–15.5)
WBC: 7.4 10*3/uL (ref 4.0–10.5)
nRBC: 0 % (ref 0.0–0.2)

## 2024-09-03 LAB — RESP PANEL BY RT-PCR (RSV, FLU A&B, COVID)  RVPGX2
Influenza A by PCR: NEGATIVE
Influenza B by PCR: NEGATIVE
Resp Syncytial Virus by PCR: NEGATIVE
SARS Coronavirus 2 by RT PCR: NEGATIVE

## 2024-09-03 MED ORDER — SODIUM CHLORIDE 0.9 % IV BOLUS
1000.0000 mL | Freq: Once | INTRAVENOUS | Status: AC
  Administered 2024-09-03: 1000 mL via INTRAVENOUS

## 2024-09-03 MED ORDER — ONDANSETRON HCL 4 MG/2ML IJ SOLN
4.0000 mg | Freq: Once | INTRAMUSCULAR | Status: AC
  Administered 2024-09-03: 4 mg via INTRAVENOUS
  Filled 2024-09-03: qty 2

## 2024-09-03 MED ORDER — DOXYCYCLINE HYCLATE 100 MG PO TABS
100.0000 mg | ORAL_TABLET | Freq: Once | ORAL | Status: AC
  Administered 2024-09-03: 100 mg via ORAL
  Filled 2024-09-03: qty 1

## 2024-09-03 NOTE — ED Triage Notes (Signed)
 Arrives POV with complaints of vomiting, dizziness, and runny nose x2 days. Patient reports that she was exposed to someone with a sinus infection and pneumonia as well.

## 2024-09-03 NOTE — Discharge Instructions (Addendum)
 Please read and follow all provided instructions.  Your diagnoses today include:  1. Nausea and vomiting, unspecified vomiting type   2. Dizziness   3. Acute non-recurrent sinusitis, unspecified location     Tests performed today include: Complete blood cell count: Normal white blood cell count Basic metabolic panel: Slightly low potassium Flu, COVID, RSV: Was negative Vital signs. See below for your results today.   Medications prescribed:  Doxycycline  - antibiotic  You have been prescribed an antibiotic medicine: take the entire course of medicine even if you are feeling better. Stopping early can cause the antibiotic not to work.  Take any prescribed medications only as directed.  Home care instructions:  Follow any educational materials contained in this packet.  BE VERY CAREFUL not to take multiple medicines containing Tylenol  (also called acetaminophen ). Doing so can lead to an overdose which can damage your liver and cause liver failure and possibly death.   Follow-up instructions: Please follow-up with your primary care provider in the next 5 days for further evaluation of your symptoms.   Return instructions:  Please return to the Emergency Department if you experience worsening symptoms.  Please return if you have any other emergent concerns.  Additional Information:  Your vital signs today were: BP 133/86 (BP Location: Right Arm)   Pulse 77   Temp 98.9 F (37.2 C) (Temporal)   Resp 18   Ht 5' 3 (1.6 m)   Wt 99.8 kg   SpO2 98%   BMI 38.97 kg/m  If your blood pressure (BP) was elevated above 135/85 this visit, please have this repeated by your doctor within one month. --------------

## 2024-09-03 NOTE — ED Provider Notes (Signed)
 " [place] EMERGENCY DEPARTMENT AT [facility] Provider Note   CSN: [id number] Arrival date & time: 09/03/24  1254     Patient presents with: Emesis and Dizziness   Casey [name] is a 54 y.o. female.   Patient with history of diabetes, Crohn's disease, high cholesterol, rheumatoid arthritis, history of gastric bypass, history of coronary stent 2024 (Brilinta ) --presents to the emergency department for evaluation of facial pain and pressure, congestion, cough over the past several days.  She states that this is leading to nausea and vomiting.  She has not been able to keep down any medications over the past 2 days.  No fevers.  Cough is nonproductive.  She has noted minimal bleeding with vomiting, states that this is not atypical when she has vomiting.  No significant abdominal pain.  She is urinating on and off.  No dysuria, hematuria, increased frequency or urgency reported.  No skin rashes or lower extremity swelling.       Prior to Admission medications  Medication Sig Start Date End Date Taking? Authorizing Provider  acetaminophen  (TYLENOL ) 500 MG tablet Take 1,000 mg by mouth every 12 (twelve) hours as needed for mild pain or moderate pain.    [provider]  albuterol  (PROVENTIL ) (2.5 MG/3ML) 0.083% nebulizer solution Take 3 mLs (2.5 mg total) by nebulization every 4 (four) hours as needed for wheezing or shortness of breath. 04/18/24   Hunsucker, Donnice SAUNDERS, MD  aMILoride (MIDAMOR) 5 MG tablet Take 10 mg by mouth daily. Patient taking differently: Take 5 mg by mouth daily. 03/18/24   [provider]  atorvastatin  (LIPITOR) 40 MG tablet Take 2 tablets (80 mg total) by mouth daily. 01/19/24   Tolia, Sunit, DO  B Complex-C (B-COMPLEX WITH VITAMIN C) tablet Take 1 tablet by mouth daily. 09/06/19   [provider]  butalbital -acetaminophen -caffeine  (FIORICET) 50-325-40 MG tablet Take 1 tablet by mouth every 6 (six) hours as needed for headache. 08/07/24    Onita Duos, MD  Cholecalciferol  (VITAMIN D3) 50 MCG (2000 UT) TABS Take 1 tablet by mouth daily.    [provider]  diphenhydramine -acetaminophen  (TYLENOL  PM) 25-500 MG TABS tablet Take 1 tablet by mouth at bedtime as needed.    [provider]  EPINEPHrine  (EPIPEN  2-PAK) 0.3 mg/0.3 mL IJ SOAJ injection Inject 0.3 mg into the muscle as needed for anaphylaxis. 04/24/24   Cari Arlean HERO, FNP  Evolocumab  (REPATHA  SURECLICK) 140 MG/ML SOAJ Inject 140 mg into the skin every 14 (fourteen) days. 12/27/23   Tolia, Sunit, DO  famotidine  (PEPCID ) 40 MG tablet Take 40 mg by mouth 2 (two) times daily. Patient taking differently: Take 40 mg by mouth daily. 12/20/23 12/19/24  [provider]  levocetirizine (XYZAL) 5 MG tablet Take 10 mg by mouth daily.    [provider]  losartan (COZAAR) 50 MG tablet Take 50 mg by mouth daily.    [provider]  lubiprostone (AMITIZA) 8 MCG capsule Take 8 mcg by mouth 2 (two) times daily.    [provider]  montelukast  (SINGULAIR ) 10 MG tablet Take 1 tablet (10 mg total) by mouth daily. 04/24/24   Cari Arlean HERO, FNP  mupirocin  ointment (BACTROBAN ) 2 % Apply 1 Application topically 2 (two) times daily. 07/11/24   Christine Rush, DPM  nitroGLYCERIN  (NITROSTAT ) 0.4 MG SL tablet Place 1 tablet (0.4 mg total) under the tongue every 5 (five) minutes as needed for chest pain. 12/13/23   Tolia, Sunit, DO  oxyCODONE  (OXY IR/ROXICODONE )  5 MG immediate release tablet Take 1 tablet (5 mg total) by mouth at bedtime. 08/08/24   Kirsteins, Prentice BRAVO, MD  RABEprazole (ACIPHEX) 20 MG tablet Take 20 mg by mouth 2 (two) times daily.    [provider]  Secukinumab (COSENTYX SENSOREADY PEN) 150 MG/ML SOAJ Inject 150 mg into the skin. Patient taking differently: Inject 150 mg into the skin every 30 (thirty) days. 03/01/24   [provider]  ticagrelor  (BRILINTA ) 90 MG TABS tablet Take 1 tablet (90 mg total) by mouth 2 (two) times daily.  01/12/24   Tolia, Sunit, DO  triamcinolone  ointment (KENALOG ) 0.1 % Apply 1 Application topically 2 (two) times daily. 07/17/24 07/17/25  [provider]    Allergies: Albuterol  sulfate, Corticosteroids, Silicone, Tape, Wound dressings, Formoterol, Hydrocodone , Hydrocodone -acetaminophen , Nickel, Other, Oxycodone -acetaminophen , Salmeterol, Strawberry extract, Advair hfa [fluticasone-salmeterol], Proventil  hfa [albuterol ], Asa [aspirin ], Cleocin [clindamycin], Clindamycin/lincomycin, Fludrocortisone acetate, Gabapentin, Hydrochlorothiazide, Imipramine, Ipratropium bromide, Medroxyprogesterone, Meloxicam, Metformin and related, Nystatin, Penicillins, Pred forte [prednisolone], Prednisone, Solu-medrol  [methylprednisolone ], Sulfa antibiotics, Tegaderm ag mesh [silver], Toradol  [ketorolac  tromethamine ], Tramadol, and Venofer  [iron  sucrose]    Review of Systems  Updated Vital Signs BP (!) 136/98 (BP Location: Left Arm)   Pulse 89   Temp 98.9 F (37.2 C) (Temporal)   Resp 20   Ht 5' 3 (1.6 m)   Wt 99.8 kg   SpO2 100%   BMI 38.97 kg/m   Physical Exam Vitals and nursing note reviewed.  Constitutional:      General: She is not in acute distress.    Appearance: She is well-developed.  HENT:     Head: Normocephalic and atraumatic.     Right Ear: Tympanic membrane, ear canal and external ear normal.     Left Ear: Tympanic membrane, ear canal and external ear normal.     Ears:     Comments: Patient with small scar on TM left ear, no signs of infection.    Nose: Congestion and rhinorrhea present.     Right Sinus: Maxillary sinus tenderness and frontal sinus tenderness present.     Left Sinus: Maxillary sinus tenderness and frontal sinus tenderness present.     Mouth/Throat:     Mouth: Mucous membranes are moist.  Eyes:     Conjunctiva/sclera: Conjunctivae normal.  Cardiovascular:     Rate and Rhythm: Normal rate and regular rhythm.     Heart sounds: No murmur heard. Pulmonary:      Effort: No respiratory distress.     Breath sounds: No wheezing, rhonchi or rales.  Abdominal:     Palpations: Abdomen is soft.     Tenderness: There is no abdominal tenderness. There is no guarding or rebound.  Musculoskeletal:     Cervical back: Normal range of motion and neck supple.     Right lower leg: No edema.     Left lower leg: No edema.  Skin:    General: Skin is warm and dry.     Findings: No rash.  Neurological:     General: No focal deficit present.     Mental Status: She is alert. Mental status is at baseline.     Motor: No weakness.  Psychiatric:        Mood and Affect: Mood normal.     (all labs ordered are listed, but only abnormal results are displayed) Labs Reviewed  CBC WITH DIFFERENTIAL/PLATELET - Abnormal; Notable for the following components:      Result Value   RBC 5.27 (*)  RDW 18.9 (*)    All other components within normal limits  COMPREHENSIVE METABOLIC PANEL WITH GFR - Abnormal; Notable for the following components:   Potassium 3.3 (*)    Glucose, Bld 102 (*)    Total Protein 8.2 (*)    All other components within normal limits  RESP PANEL BY RT-PCR (RSV, FLU A&B, COVID)  [id number]  URINALYSIS, ROUTINE W REFLEX MICROSCOPIC    EKG: None  Radiology: DG Chest 2 View Result Date: 09/03/2024 CLINICAL DATA:  Cough and vomiting EXAM: CHEST - 2 VIEW COMPARISON:  05/01/2024 FINDINGS: Mild hypoventilatory change. No pleural effusion or consolidation. Minimal atelectasis or scar at the left base. No pneumothorax IMPRESSION: No active cardiopulmonary disease. Minimal atelectasis or scar at the left base. Electronically Signed   By: Luke Bun M.D.   On: 09/03/2024 15:38     Procedures   Medications Ordered in the ED - No data to display  ED Course  Patient seen and examined. History obtained directly from patient.   Labs/EKG: Ordered CBC, CMP, lipase, UA, flu swab.  Imaging: Ordered chest x-ray  Medications/Fluids: Ordered: IV fluid bolus,  Zofran .  Most recent vital signs reviewed and are as follows: BP (!) 136/98 (BP Location: Left Arm)   Pulse 89   Temp 98.9 F (37.2 C) (Temporal)   Resp 20   Ht 5' 3 (1.6 m)   Wt 99.8 kg   SpO2 100%   BMI 38.97 kg/m   Initial impression: Clinically patient has sinusitis with cough, possibly viral, but she is having also a lot of vomiting which would be atypical.  Lab workup ordered.  5:31 PM Reassessment performed. Patient appears stable.  She seems frustrated about not having gotten a second liter of IV fluids.  I have ordered a another liter.  Patient reports being lightheaded and dizzy.  She has been up to the restroom, but did not know that she needed to collect a urine, continuing to wait UA.  Labs personally reviewed and interpreted including: CBC unremarkable; CMP mild hypokalemia otherwise unremarkable; viral panel negative.  Chest x-ray personally reviewed and interpreted, agree no pneumonia.  Reviewed pertinent lab work and imaging with patient at bedside. Questions answered.   Most current vital signs reviewed and are as follows: BP (!) 151/99 (BP Location: Right Arm)   Pulse 78   Temp 98.9 F (37.2 C) (Temporal)   Resp 18   Ht 5' 3 (1.6 m)   Wt 99.8 kg   SpO2 93%   BMI 38.97 kg/m   Plan: Continue hydration.  Offered additional antiemetics, patient declines.  She states that it is not unusual for her to start vomiting when she gets sick.  7:06 PM Reassessment performed. Patient appears stable and comfortable.  No further vomiting.  Second liter of IV fluids is completed.  Patient is comfortable with discharged home at this time.  Will plan to treat acute sinusitis.  She states that she has had improvement with doxycycline  in the past and can tolerate this medication.  Will give first dose prior to discharge.  Urine was unable to be obtained, however patient is urinating in the department.  Patient without any UTI symptoms.  Reviewed pertinent lab work and  imaging with patient at bedside. Questions answered.   Most current vital signs reviewed and are as follows: BP 133/86 (BP Location: Right Arm)   Pulse 77   Temp 97.7 F (36.5 C) (Oral)   Resp 18   Ht 5' 3 (1.6  m)   Wt 99.8 kg   SpO2 98%   BMI 38.97 kg/m   Plan: Discharge to home.   Prescriptions written for: Doxycycline   Other home care instructions discussed: Rest, OTC meds, home antiemetics, focus on hydration, small amounts of liquid at a time.  ED return instructions discussed: Return with persistent vomiting, worsening abdominal pain, high fever, new or worsening symptoms.  Follow-up instructions discussed: Patient encouraged to follow-up with their PCP in 5 days if not improving.                                  Medical Decision Making Amount and/or Complexity of Data Reviewed Labs: ordered. Radiology: ordered.  Risk Prescription drug management.   Patient presents with sinus infection symptoms including congestion, facial pain, cough likely due to postnasal drip.  Chest x-ray was negative.  Flu panel was negative.  Patient also complains of nausea and vomiting.  She states that this happens frequently when she gets sick.  This was treated with IV fluids.  Patient clinically stable in the ED.  No persistent vomiting.  She declines antiemetics.  She is agreeable to trial of doxycycline  and over-the-counter medications for treatment of sinusitis.  Discussed return instructions as above.  Lab workup is very reassuring.  No focal abdominal pain.  The patient's vital signs, pertinent lab work and imaging were reviewed and interpreted as discussed in the ED course. Hospitalization was considered for further testing, treatments, or serial exams/observation. However as patient is well-appearing, has a stable exam, and reassuring studies today, I do not feel that they warrant admission at this time. This plan was discussed with the patient who verbalizes agreement and comfort  with this plan and seems reliable and able to return to the Emergency Department with worsening or changing symptoms.        Final diagnoses:  Nausea and vomiting, unspecified vomiting type  Dizziness  Acute non-recurrent sinusitis, unspecified location    ED Discharge Orders     None          Desiderio Chew, [place] 09/03/24 1909  "

## 2024-09-03 NOTE — ED Notes (Signed)

## 2024-09-03 NOTE — ED Notes (Signed)
 Pt aware of the need for a urine... Pt currently unable to provide a sample.SABRASABRASABRA

## 2024-09-04 ENCOUNTER — Telehealth (HOSPITAL_BASED_OUTPATIENT_CLINIC_OR_DEPARTMENT_OTHER): Payer: Self-pay | Admitting: Student

## 2024-09-04 ENCOUNTER — Telehealth: Payer: Self-pay | Admitting: Neurology

## 2024-09-04 MED ORDER — DOXYCYCLINE HYCLATE 100 MG PO CAPS: 100.0000 mg | ORAL_CAPSULE | Freq: Two times a day (BID) | ORAL | 0 refills | Status: AC

## 2024-09-04 NOTE — Procedures (Signed)
" ° °  HISTORY: 54 year old female with passing out episode  TECHNIQUE:  This is a routine 16 channel EEG recording with one channel devoted to a limited EKG recording.  It was performed during wakefulness, drowsiness and asleep.  Hyperventilation and photic stimulation were performed as activating procedures.  There are minimum muscle and movement artifact noted.  Upon maximum arousal, posterior dominant waking rhythm consistent of mildly dysrhythmic low amplitude mixed alpha and beta range activity. Activities are symmetric over the bilateral posterior derivations and attenuated with eye opening.  Photic stimulation did not alter the tracing.  Hyperventilation produced mild/moderate buildup with higher amplitude and the slower activities noted.  During EEG recording, patient developed drowsiness and no deeper stage of sleep was achieved  During EEG recording, there was no epileptiform discharge noted.  EKG demonstrate normal sinus rhythm.  CONCLUSION: This is a  normal awake EEG.  There is no electrodiagnostic evidence of epileptiform discharge.  Prinston Kynard, M.D. Ph.D.  [facility] [address] [place], [place] [address] Phone: [phone] Fax:      [phone]  "

## 2024-09-04 NOTE — Telephone Encounter (Signed)
 Patient said cardiologist advised if still having symptoms dizziness, headaches, feeling like going to pass out,schedule appointment with neurologist. Would like a call back to schedule appointment.

## 2024-09-04 NOTE — Telephone Encounter (Cosign Needed)
 Patient called ED about prescription for doxycycline .  Patient seen yesterday and was diagnosed with acute sinusitis.  Per provider note patient was supposed to have prescription sent to pharmacy for doxycycline  however, does not appear to be sent.  Prescription sent to patient's pharmacy.

## 2024-09-05 NOTE — Telephone Encounter (Signed)
 Pt has had her testing,  EEG, MRI Carotid.  Asking for a f/u appt.  MRI is [facility] in care everywhere to view results.  Still having symptoms dizziness, headaches and feeling of like going to pass out.  Cardiology referred back to us .

## 2024-09-05 NOTE — Telephone Encounter (Signed)
 patient said she dropped off 2 disk on 08/30/24. Casey [name] have you seen disk?

## 2024-09-05 NOTE — Telephone Encounter (Signed)
 Pt has called and scheduled an appointment to discuss the completion of all test done and to also discuss her still having symptoms.

## 2024-09-05 NOTE — Telephone Encounter (Signed)
 On Schedule with me for May 2026, advise patient to bring [facility] MRI cd to review

## 2024-09-06 ENCOUNTER — Encounter: Admitting: Physical Medicine & Rehabilitation

## 2024-09-06 NOTE — Telephone Encounter (Signed)
Dr.Yan  

## 2024-09-09 ENCOUNTER — Encounter: Payer: Self-pay | Admitting: Cardiology

## 2024-09-11 ENCOUNTER — Ambulatory Visit (HOSPITAL_BASED_OUTPATIENT_CLINIC_OR_DEPARTMENT_OTHER): Admitting: Pulmonary Disease

## 2024-10-14 ENCOUNTER — Ambulatory Visit (HOSPITAL_BASED_OUTPATIENT_CLINIC_OR_DEPARTMENT_OTHER): Admitting: Pulmonary Disease

## 2024-10-19 ENCOUNTER — Encounter: Admitting: Physical Medicine & Rehabilitation

## 2024-10-22 ENCOUNTER — Inpatient Hospital Stay: Admitting: Hematology and Oncology

## 2024-10-22 ENCOUNTER — Inpatient Hospital Stay

## 2024-10-23 ENCOUNTER — Inpatient Hospital Stay

## 2024-10-23 ENCOUNTER — Encounter: Admitting: Physical Medicine & Rehabilitation

## 2024-10-23 ENCOUNTER — Inpatient Hospital Stay: Admitting: Hematology and Oncology

## 2024-11-02 ENCOUNTER — Ambulatory Visit (HOSPITAL_BASED_OUTPATIENT_CLINIC_OR_DEPARTMENT_OTHER): Admitting: Student

## 2024-12-27 ENCOUNTER — Ambulatory Visit: Admitting: Neurology
